# Patient Record
Sex: Female | Born: 1950 | Race: Black or African American | Hispanic: No | Smoking: Former smoker
Health system: Southern US, Community
[De-identification: ages and names within clinical notes are randomized; demographics above are authoritative.]

## PROBLEM LIST (undated history)

## (undated) DIAGNOSIS — I10 Essential (primary) hypertension: Secondary | ICD-10-CM

## (undated) DIAGNOSIS — F329 Major depressive disorder, single episode, unspecified: Secondary | ICD-10-CM

## (undated) DIAGNOSIS — Z9889 Other specified postprocedural states: Secondary | ICD-10-CM

## (undated) DIAGNOSIS — E1169 Type 2 diabetes mellitus with other specified complication: Secondary | ICD-10-CM

## (undated) DIAGNOSIS — D649 Anemia, unspecified: Secondary | ICD-10-CM

## (undated) DIAGNOSIS — R112 Nausea with vomiting, unspecified: Secondary | ICD-10-CM

## (undated) DIAGNOSIS — F419 Anxiety disorder, unspecified: Secondary | ICD-10-CM

## (undated) DIAGNOSIS — N189 Chronic kidney disease, unspecified: Secondary | ICD-10-CM

## (undated) DIAGNOSIS — E119 Type 2 diabetes mellitus without complications: Secondary | ICD-10-CM

## (undated) DIAGNOSIS — E7800 Pure hypercholesterolemia, unspecified: Secondary | ICD-10-CM

## (undated) DIAGNOSIS — M1990 Unspecified osteoarthritis, unspecified site: Secondary | ICD-10-CM

## (undated) DIAGNOSIS — K219 Gastro-esophageal reflux disease without esophagitis: Secondary | ICD-10-CM

## (undated) DIAGNOSIS — G4730 Sleep apnea, unspecified: Secondary | ICD-10-CM

## (undated) DIAGNOSIS — F32A Depression, unspecified: Secondary | ICD-10-CM

## (undated) HISTORY — PX: ABDOMINAL SURGERY: SHX537

## (undated) HISTORY — PX: COLONOSCOPY: SHX174

## (undated) HISTORY — PX: UPPER GASTROINTESTINAL ENDOSCOPY: SHX188

---
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
## 1980-11-18 HISTORY — PX: ABDOMINAL HYSTERECTOMY: SHX81

## 1981-11-18 HISTORY — PX: BREAST SURGERY: SHX581

## 1984-11-18 HISTORY — PX: RESECTION AXILLARY TUMOR: SUR1236

## 1999-03-13 ENCOUNTER — Encounter: Admission: RE | Admit: 1999-03-13 | Discharge: 1999-03-13 | Payer: Self-pay | Admitting: Hematology and Oncology

## 1999-03-21 ENCOUNTER — Ambulatory Visit (HOSPITAL_COMMUNITY): Admission: RE | Admit: 1999-03-21 | Discharge: 1999-03-21 | Payer: Self-pay | Admitting: *Deleted

## 1999-03-26 ENCOUNTER — Ambulatory Visit (HOSPITAL_COMMUNITY): Admission: RE | Admit: 1999-03-26 | Discharge: 1999-03-26 | Payer: Self-pay | Admitting: *Deleted

## 1999-03-26 ENCOUNTER — Encounter: Payer: Self-pay | Admitting: *Deleted

## 1999-04-10 ENCOUNTER — Ambulatory Visit (HOSPITAL_COMMUNITY): Admission: RE | Admit: 1999-04-10 | Discharge: 1999-04-10 | Payer: Self-pay | Admitting: *Deleted

## 1999-07-10 ENCOUNTER — Encounter: Admission: RE | Admit: 1999-07-10 | Discharge: 1999-07-10 | Payer: Self-pay | Admitting: Internal Medicine

## 1999-08-22 ENCOUNTER — Encounter: Admission: RE | Admit: 1999-08-22 | Discharge: 1999-08-22 | Payer: Self-pay | Admitting: Internal Medicine

## 2000-01-29 ENCOUNTER — Encounter: Admission: RE | Admit: 2000-01-29 | Discharge: 2000-01-29 | Payer: Self-pay | Admitting: Internal Medicine

## 2000-01-29 ENCOUNTER — Encounter: Payer: Self-pay | Admitting: Internal Medicine

## 2000-01-29 ENCOUNTER — Ambulatory Visit (HOSPITAL_COMMUNITY): Admission: RE | Admit: 2000-01-29 | Discharge: 2000-01-29 | Payer: Self-pay | Admitting: Internal Medicine

## 2000-06-18 ENCOUNTER — Encounter: Payer: Self-pay | Admitting: Emergency Medicine

## 2000-06-18 ENCOUNTER — Emergency Department (HOSPITAL_COMMUNITY): Admission: EM | Admit: 2000-06-18 | Discharge: 2000-06-18 | Payer: Self-pay | Admitting: Emergency Medicine

## 2000-12-30 ENCOUNTER — Encounter: Admission: RE | Admit: 2000-12-30 | Discharge: 2000-12-30 | Payer: Self-pay | Admitting: Internal Medicine

## 2001-06-30 ENCOUNTER — Encounter: Admission: RE | Admit: 2001-06-30 | Discharge: 2001-06-30 | Payer: Self-pay | Admitting: Internal Medicine

## 2001-07-28 ENCOUNTER — Encounter: Admission: RE | Admit: 2001-07-28 | Discharge: 2001-07-28 | Payer: Self-pay | Admitting: Internal Medicine

## 2001-08-11 ENCOUNTER — Ambulatory Visit (HOSPITAL_COMMUNITY): Admission: RE | Admit: 2001-08-11 | Discharge: 2001-08-11 | Payer: Self-pay | Admitting: Obstetrics

## 2002-01-19 ENCOUNTER — Encounter: Admission: RE | Admit: 2002-01-19 | Discharge: 2002-01-19 | Payer: Self-pay | Admitting: Internal Medicine

## 2002-04-27 ENCOUNTER — Encounter: Admission: RE | Admit: 2002-04-27 | Discharge: 2002-04-27 | Payer: Self-pay | Admitting: Internal Medicine

## 2003-03-22 ENCOUNTER — Encounter: Admission: RE | Admit: 2003-03-22 | Discharge: 2003-03-22 | Payer: Self-pay | Admitting: Internal Medicine

## 2003-11-29 ENCOUNTER — Encounter: Admission: RE | Admit: 2003-11-29 | Discharge: 2003-11-29 | Payer: Self-pay | Admitting: Internal Medicine

## 2003-12-07 ENCOUNTER — Ambulatory Visit (HOSPITAL_COMMUNITY): Admission: RE | Admit: 2003-12-07 | Discharge: 2003-12-07 | Payer: Self-pay | Admitting: Internal Medicine

## 2003-12-08 ENCOUNTER — Encounter: Admission: RE | Admit: 2003-12-08 | Discharge: 2003-12-08 | Payer: Self-pay | Admitting: Internal Medicine

## 2004-06-05 ENCOUNTER — Encounter: Admission: RE | Admit: 2004-06-05 | Discharge: 2004-06-05 | Payer: Self-pay | Admitting: Internal Medicine

## 2004-12-07 ENCOUNTER — Ambulatory Visit (HOSPITAL_COMMUNITY): Admission: RE | Admit: 2004-12-07 | Discharge: 2004-12-07 | Payer: Self-pay | Admitting: Internal Medicine

## 2005-04-02 ENCOUNTER — Ambulatory Visit: Payer: Self-pay | Admitting: Internal Medicine

## 2005-04-03 ENCOUNTER — Ambulatory Visit (HOSPITAL_COMMUNITY): Admission: RE | Admit: 2005-04-03 | Discharge: 2005-04-03 | Payer: Self-pay | Admitting: Internal Medicine

## 2005-11-26 ENCOUNTER — Ambulatory Visit: Payer: Self-pay | Admitting: Internal Medicine

## 2005-12-09 ENCOUNTER — Ambulatory Visit (HOSPITAL_COMMUNITY): Admission: RE | Admit: 2005-12-09 | Discharge: 2005-12-09 | Payer: Self-pay | Admitting: Internal Medicine

## 2005-12-17 ENCOUNTER — Ambulatory Visit: Payer: Self-pay | Admitting: Internal Medicine

## 2006-04-23 ENCOUNTER — Ambulatory Visit: Payer: Self-pay | Admitting: Family Medicine

## 2006-04-28 ENCOUNTER — Ambulatory Visit (HOSPITAL_COMMUNITY): Admission: RE | Admit: 2006-04-28 | Discharge: 2006-04-28 | Payer: Self-pay | Admitting: Family Medicine

## 2006-05-26 ENCOUNTER — Ambulatory Visit: Payer: Self-pay | Admitting: Family Medicine

## 2006-06-09 ENCOUNTER — Ambulatory Visit: Payer: Self-pay | Admitting: Family Medicine

## 2006-06-10 ENCOUNTER — Ambulatory Visit (HOSPITAL_COMMUNITY): Admission: RE | Admit: 2006-06-10 | Discharge: 2006-06-10 | Payer: Self-pay | Admitting: Family Medicine

## 2006-06-12 ENCOUNTER — Ambulatory Visit: Payer: Self-pay | Admitting: Gastroenterology

## 2006-06-16 ENCOUNTER — Encounter (HOSPITAL_COMMUNITY): Admission: RE | Admit: 2006-06-16 | Discharge: 2006-07-16 | Payer: Self-pay | Admitting: Family Medicine

## 2006-06-23 ENCOUNTER — Ambulatory Visit (HOSPITAL_COMMUNITY): Admission: RE | Admit: 2006-06-23 | Discharge: 2006-06-23 | Payer: Self-pay | Admitting: Gastroenterology

## 2006-06-23 ENCOUNTER — Ambulatory Visit: Payer: Self-pay | Admitting: Gastroenterology

## 2006-10-13 ENCOUNTER — Ambulatory Visit: Payer: Self-pay | Admitting: Family Medicine

## 2006-11-21 DIAGNOSIS — K219 Gastro-esophageal reflux disease without esophagitis: Secondary | ICD-10-CM | POA: Insufficient documentation

## 2006-11-21 DIAGNOSIS — I10 Essential (primary) hypertension: Secondary | ICD-10-CM | POA: Insufficient documentation

## 2006-11-21 DIAGNOSIS — J309 Allergic rhinitis, unspecified: Secondary | ICD-10-CM | POA: Insufficient documentation

## 2006-11-21 DIAGNOSIS — F418 Other specified anxiety disorders: Secondary | ICD-10-CM

## 2006-12-11 ENCOUNTER — Ambulatory Visit (HOSPITAL_COMMUNITY): Admission: RE | Admit: 2006-12-11 | Discharge: 2006-12-11 | Payer: Self-pay | Admitting: Family Medicine

## 2006-12-31 ENCOUNTER — Encounter: Payer: Self-pay | Admitting: Family Medicine

## 2006-12-31 LAB — CONVERTED CEMR LAB
Bilirubin Urine: NEGATIVE
Hemoglobin, Urine: NEGATIVE
Ketones, ur: NEGATIVE mg/dL
Nitrite: NEGATIVE
Specific Gravity, Urine: 1.027 (ref 1.005–1.03)
Urobilinogen, UA: 0.2 (ref 0.0–1.0)

## 2007-01-05 ENCOUNTER — Encounter: Payer: Self-pay | Admitting: Family Medicine

## 2007-01-05 LAB — CONVERTED CEMR LAB
BUN: 13 mg/dL (ref 6–23)
Potassium: 4.4 meq/L (ref 3.5–5.3)
VLDL: 28 mg/dL (ref 0–40)

## 2007-01-09 ENCOUNTER — Ambulatory Visit: Payer: Self-pay | Admitting: Family Medicine

## 2007-01-12 ENCOUNTER — Ambulatory Visit (HOSPITAL_COMMUNITY): Admission: RE | Admit: 2007-01-12 | Discharge: 2007-01-12 | Payer: Self-pay | Admitting: Family Medicine

## 2007-01-13 ENCOUNTER — Ambulatory Visit (HOSPITAL_COMMUNITY): Admission: RE | Admit: 2007-01-13 | Discharge: 2007-01-13 | Payer: Self-pay | Admitting: Family Medicine

## 2007-04-09 ENCOUNTER — Other Ambulatory Visit: Admission: RE | Admit: 2007-04-09 | Discharge: 2007-04-09 | Payer: Self-pay | Admitting: Family Medicine

## 2007-04-09 ENCOUNTER — Encounter: Payer: Self-pay | Admitting: Family Medicine

## 2007-04-09 ENCOUNTER — Ambulatory Visit: Payer: Self-pay | Admitting: Family Medicine

## 2007-04-09 ENCOUNTER — Encounter (INDEPENDENT_AMBULATORY_CARE_PROVIDER_SITE_OTHER): Payer: Self-pay | Admitting: *Deleted

## 2007-04-20 ENCOUNTER — Ambulatory Visit (HOSPITAL_COMMUNITY): Admission: RE | Admit: 2007-04-20 | Discharge: 2007-04-20 | Payer: Self-pay | Admitting: Family Medicine

## 2007-05-12 ENCOUNTER — Encounter: Payer: Self-pay | Admitting: Family Medicine

## 2007-05-12 LAB — CONVERTED CEMR LAB
AST: 19 units/L (ref 0–37)
Cholesterol: 192 mg/dL (ref 0–200)
Indirect Bilirubin: 0.2 mg/dL (ref 0.0–0.9)
Total Bilirubin: 0.3 mg/dL (ref 0.3–1.2)
Total Protein: 7.8 g/dL (ref 6.0–8.3)
VLDL: 34 mg/dL (ref 0–40)

## 2007-05-21 ENCOUNTER — Ambulatory Visit: Payer: Self-pay | Admitting: Family Medicine

## 2007-09-16 ENCOUNTER — Encounter: Payer: Self-pay | Admitting: Family Medicine

## 2007-09-16 LAB — CONVERTED CEMR LAB
BUN: 21 mg/dL (ref 6–23)
Basophils Relative: 1 % (ref 0–1)
Eosinophils Absolute: 0.2 10*3/uL (ref 0.0–0.7)
Hemoglobin: 12.5 g/dL (ref 12.0–15.0)
LDL Cholesterol: 146 mg/dL — ABNORMAL HIGH (ref 0–99)
Lymphocytes Relative: 47 % — ABNORMAL HIGH (ref 12–46)
Lymphs Abs: 2.5 10*3/uL (ref 0.7–3.3)
MCV: 81.8 fL (ref 78.0–100.0)
Monocytes Relative: 10 % (ref 3–11)
Neutro Abs: 2.1 10*3/uL (ref 1.7–7.7)
Platelets: 207 10*3/uL (ref 150–400)
RDW: 15.9 % — ABNORMAL HIGH (ref 11.5–14.0)
Sodium: 140 meq/L (ref 135–145)
Triglycerides: 114 mg/dL (ref <150)
VLDL: 23 mg/dL (ref 0–40)
WBC: 5.4 10*3/uL (ref 4.0–10.5)

## 2007-09-21 ENCOUNTER — Ambulatory Visit: Payer: Self-pay | Admitting: Family Medicine

## 2007-09-30 ENCOUNTER — Ambulatory Visit: Payer: Self-pay | Admitting: Cardiovascular Disease

## 2007-10-06 ENCOUNTER — Ambulatory Visit (HOSPITAL_COMMUNITY): Admission: RE | Admit: 2007-10-06 | Discharge: 2007-10-06 | Payer: Self-pay | Admitting: Cardiovascular Disease

## 2007-10-06 ENCOUNTER — Ambulatory Visit: Payer: Self-pay | Admitting: Cardiovascular Disease

## 2007-10-19 ENCOUNTER — Ambulatory Visit: Payer: Self-pay | Admitting: Internal Medicine

## 2007-10-19 ENCOUNTER — Encounter (HOSPITAL_COMMUNITY): Admission: RE | Admit: 2007-10-19 | Discharge: 2007-11-18 | Payer: Self-pay | Admitting: Cardiovascular Disease

## 2007-11-19 ENCOUNTER — Encounter: Payer: Self-pay | Admitting: Family Medicine

## 2007-12-14 ENCOUNTER — Ambulatory Visit (HOSPITAL_COMMUNITY): Admission: RE | Admit: 2007-12-14 | Discharge: 2007-12-14 | Payer: Self-pay | Admitting: Family Medicine

## 2008-02-08 ENCOUNTER — Encounter: Payer: Self-pay | Admitting: Family Medicine

## 2008-02-08 LAB — CONVERTED CEMR LAB
AST: 18 units/L (ref 0–37)
Alkaline Phosphatase: 70 units/L (ref 39–117)
Bilirubin, Direct: 0.1 mg/dL (ref 0.0–0.3)
CO2: 26 meq/L (ref 19–32)
Calcium: 9.8 mg/dL (ref 8.4–10.5)
Chloride: 104 meq/L (ref 96–112)
Cholesterol: 228 mg/dL — ABNORMAL HIGH (ref 0–200)
Glucose, Bld: 103 mg/dL — ABNORMAL HIGH (ref 70–99)
HCT: 40.2 % (ref 36.0–46.0)
Hemoglobin: 12.3 g/dL (ref 12.0–15.0)
Indirect Bilirubin: 0.1 mg/dL (ref 0.0–0.9)
LDL Cholesterol: 133 mg/dL — ABNORMAL HIGH (ref 0–99)
MCV: 82.5 fL (ref 78.0–100.0)
Monocytes Relative: 9 % (ref 3–12)
Neutro Abs: 2 10*3/uL (ref 1.7–7.7)
Platelets: 233 10*3/uL (ref 150–400)
RDW: 16.8 % — ABNORMAL HIGH (ref 11.5–15.5)
Sodium: 140 meq/L (ref 135–145)
Triglycerides: 178 mg/dL — ABNORMAL HIGH (ref <150)
VLDL: 36 mg/dL (ref 0–40)

## 2008-02-09 ENCOUNTER — Ambulatory Visit: Payer: Self-pay | Admitting: Family Medicine

## 2008-02-18 ENCOUNTER — Encounter (INDEPENDENT_AMBULATORY_CARE_PROVIDER_SITE_OTHER): Payer: Self-pay | Admitting: *Deleted

## 2008-06-02 ENCOUNTER — Encounter: Payer: Self-pay | Admitting: Family Medicine

## 2008-06-02 LAB — CONVERTED CEMR LAB
ALT: 23 units/L (ref 0–35)
Alkaline Phosphatase: 75 units/L (ref 39–117)
HDL: 52 mg/dL (ref >39)
Triglycerides: 118 mg/dL (ref <150)

## 2008-06-16 ENCOUNTER — Ambulatory Visit: Payer: Self-pay | Admitting: Family Medicine

## 2008-06-16 ENCOUNTER — Encounter: Payer: Self-pay | Admitting: Family Medicine

## 2008-06-17 ENCOUNTER — Ambulatory Visit (HOSPITAL_COMMUNITY): Admission: RE | Admit: 2008-06-17 | Discharge: 2008-06-17 | Payer: Self-pay | Admitting: Family Medicine

## 2008-06-17 DIAGNOSIS — E785 Hyperlipidemia, unspecified: Secondary | ICD-10-CM | POA: Insufficient documentation

## 2008-10-17 ENCOUNTER — Encounter: Payer: Self-pay | Admitting: Family Medicine

## 2008-10-17 LAB — CONVERTED CEMR LAB
AST: 16 units/L (ref 0–37)
Alkaline Phosphatase: 61 units/L (ref 39–117)
Calcium: 9.4 mg/dL (ref 8.4–10.5)
Cholesterol: 160 mg/dL (ref 0–200)
HDL: 49 mg/dL (ref >39)
LDL Cholesterol: 82 mg/dL (ref 0–99)
Potassium: 3.7 meq/L (ref 3.5–5.3)
Total CHOL/HDL Ratio: 3.3
Total Protein: 7.3 g/dL (ref 6.0–8.3)
Triglycerides: 146 mg/dL (ref <150)
VLDL: 29 mg/dL (ref 0–40)

## 2008-10-19 ENCOUNTER — Ambulatory Visit: Payer: Self-pay | Admitting: Family Medicine

## 2009-01-02 ENCOUNTER — Ambulatory Visit (HOSPITAL_COMMUNITY): Admission: RE | Admit: 2009-01-02 | Discharge: 2009-01-02 | Payer: Self-pay | Admitting: Family Medicine

## 2009-02-17 ENCOUNTER — Encounter: Payer: Self-pay | Admitting: Family Medicine

## 2009-02-17 LAB — CONVERTED CEMR LAB
AST: 20 units/L (ref 0–37)
BUN: 19 mg/dL (ref 6–23)
Calcium: 9.2 mg/dL (ref 8.4–10.5)
Cholesterol: 165 mg/dL (ref 0–200)
Creatinine, Ser: 0.75 mg/dL (ref 0.40–1.20)
Potassium: 3.9 meq/L (ref 3.5–5.3)
Sodium: 141 meq/L (ref 135–145)
Total Bilirubin: 0.4 mg/dL (ref 0.3–1.2)
Total Protein: 7.8 g/dL (ref 6.0–8.3)

## 2009-02-22 ENCOUNTER — Ambulatory Visit: Payer: Self-pay | Admitting: Family Medicine

## 2009-02-24 DIAGNOSIS — E669 Obesity, unspecified: Secondary | ICD-10-CM | POA: Insufficient documentation

## 2009-05-17 DIAGNOSIS — R079 Chest pain, unspecified: Secondary | ICD-10-CM

## 2009-05-17 DIAGNOSIS — R0602 Shortness of breath: Secondary | ICD-10-CM | POA: Insufficient documentation

## 2009-05-17 DIAGNOSIS — R002 Palpitations: Secondary | ICD-10-CM | POA: Insufficient documentation

## 2009-08-02 ENCOUNTER — Ambulatory Visit: Payer: Self-pay | Admitting: Family Medicine

## 2009-08-02 LAB — CONVERTED CEMR LAB
ALT: 18 units/L (ref 0–35)
AST: 15 units/L (ref 0–37)
Albumin: 4.5 g/dL (ref 3.5–5.2)
BUN: 17 mg/dL (ref 6–23)
Basophils Relative: 1 % (ref 0–1)
Creatinine, Ser: 0.67 mg/dL (ref 0.40–1.20)
HCT: 38.5 % (ref 36.0–46.0)
HDL: 57 mg/dL (ref >39)
Hemoglobin: 12.1 g/dL (ref 12.0–15.0)
Indirect Bilirubin: 0.1 mg/dL (ref 0.0–0.9)
LDL Cholesterol: 86 mg/dL (ref 0–99)
Lymphs Abs: 2.5 10*3/uL (ref 0.7–4.0)
Monocytes Absolute: 0.6 10*3/uL (ref 0.1–1.0)
Monocytes Relative: 10 % (ref 3–12)
Neutro Abs: 2.3 10*3/uL (ref 1.7–7.7)
Platelets: 230 10*3/uL (ref 150–400)
Total Bilirubin: 0.2 mg/dL — ABNORMAL LOW (ref 0.3–1.2)
Total CHOL/HDL Ratio: 3
Total Protein: 7.6 g/dL (ref 6.0–8.3)
WBC: 5.6 10*3/uL (ref 4.0–10.5)

## 2009-08-03 LAB — CONVERTED CEMR LAB: Hgb A1c MFr Bld: 6.7 % — ABNORMAL HIGH (ref 4.6–6.1)

## 2009-08-13 DIAGNOSIS — E739 Lactose intolerance, unspecified: Secondary | ICD-10-CM

## 2009-08-15 LAB — CONVERTED CEMR LAB: Glucose, 2 hour: 114 mg/dL (ref 70–139)

## 2009-12-28 LAB — CONVERTED CEMR LAB
BUN: 16 mg/dL (ref 6–23)
CO2: 31 meq/L (ref 19–32)
Calcium: 9.9 mg/dL (ref 8.4–10.5)
Chloride: 102 meq/L (ref 96–112)
Glucose, Bld: 86 mg/dL (ref 70–99)
HDL: 58 mg/dL (ref >39)
LDL Cholesterol: 82 mg/dL (ref 0–99)
Potassium: 3.9 meq/L (ref 3.5–5.3)
Total Bilirubin: 0.4 mg/dL (ref 0.3–1.2)
Total CHOL/HDL Ratio: 2.7
Total Protein: 7.4 g/dL (ref 6.0–8.3)
VLDL: 17 mg/dL (ref 0–40)

## 2010-01-01 ENCOUNTER — Ambulatory Visit: Payer: Self-pay | Admitting: Family Medicine

## 2010-01-01 ENCOUNTER — Other Ambulatory Visit: Admission: RE | Admit: 2010-01-01 | Discharge: 2010-01-01 | Payer: Self-pay | Admitting: Family Medicine

## 2010-01-01 LAB — CONVERTED CEMR LAB
Hgb A1c MFr Bld: 6.4 %
Pap Smear: NEGATIVE

## 2010-01-04 ENCOUNTER — Ambulatory Visit (HOSPITAL_COMMUNITY): Admission: RE | Admit: 2010-01-04 | Discharge: 2010-01-04 | Payer: Self-pay | Admitting: Family Medicine

## 2010-04-03 ENCOUNTER — Ambulatory Visit: Payer: Self-pay | Admitting: Family Medicine

## 2010-04-03 DIAGNOSIS — J019 Acute sinusitis, unspecified: Secondary | ICD-10-CM | POA: Insufficient documentation

## 2010-04-03 DIAGNOSIS — J209 Acute bronchitis, unspecified: Secondary | ICD-10-CM | POA: Insufficient documentation

## 2010-06-11 LAB — CONVERTED CEMR LAB
ALT: 21 units/L (ref 0–35)
Albumin: 4.3 g/dL (ref 3.5–5.2)
BUN: 18 mg/dL (ref 6–23)
Chloride: 104 meq/L (ref 96–112)
Cholesterol: 163 mg/dL (ref 0–200)
HDL: 53 mg/dL (ref >39)
Indirect Bilirubin: 0.1 mg/dL (ref 0.0–0.9)
LDL Cholesterol: 87 mg/dL (ref 0–99)
Potassium: 4.1 meq/L (ref 3.5–5.3)
Sodium: 144 meq/L (ref 135–145)
Total Protein: 6.9 g/dL (ref 6.0–8.3)
Triglycerides: 116 mg/dL (ref <150)
VLDL: 23 mg/dL (ref 0–40)

## 2010-06-12 ENCOUNTER — Ambulatory Visit: Payer: Self-pay | Admitting: Family Medicine

## 2010-06-12 DIAGNOSIS — R7301 Impaired fasting glucose: Secondary | ICD-10-CM | POA: Insufficient documentation

## 2010-06-22 ENCOUNTER — Telehealth: Payer: Self-pay | Admitting: Family Medicine

## 2010-11-21 LAB — CONVERTED CEMR LAB: Hgb A1c MFr Bld: 6.4 % — ABNORMAL HIGH (ref <5.7)

## 2010-11-28 ENCOUNTER — Ambulatory Visit
Admission: RE | Admit: 2010-11-28 | Discharge: 2010-11-28 | Payer: Self-pay | Source: Home / Self Care | Attending: Family Medicine | Admitting: Family Medicine

## 2010-11-28 DIAGNOSIS — E1169 Type 2 diabetes mellitus with other specified complication: Secondary | ICD-10-CM

## 2010-11-28 DIAGNOSIS — E669 Obesity, unspecified: Secondary | ICD-10-CM

## 2010-11-28 HISTORY — DX: Type 2 diabetes mellitus with other specified complication: E66.9

## 2010-11-28 HISTORY — DX: Type 2 diabetes mellitus with other specified complication: E11.69

## 2010-11-29 ENCOUNTER — Encounter: Payer: Self-pay | Admitting: Family Medicine

## 2010-12-07 ENCOUNTER — Other Ambulatory Visit: Payer: Self-pay | Admitting: Family Medicine

## 2010-12-07 DIAGNOSIS — Z1239 Encounter for other screening for malignant neoplasm of breast: Secondary | ICD-10-CM

## 2010-12-09 ENCOUNTER — Encounter: Payer: Self-pay | Admitting: Family Medicine

## 2010-12-13 ENCOUNTER — Telehealth: Payer: Self-pay | Admitting: Family Medicine

## 2010-12-18 NOTE — Letter (Signed)
Summary: consults  consults   Imported By: Curtis Sites 04/25/2010 12:52:00  _____________________________________________________________________  External Attachment:    Type:   Image     Comment:   External Document

## 2010-12-18 NOTE — Letter (Signed)
Summary: lab  lab   Imported By: Curtis Sites 04/25/2010 12:52:54  _____________________________________________________________________  External Attachment:    Type:   Image     Comment:   External Document

## 2010-12-18 NOTE — Letter (Signed)
Summary: history and physical  history and physical   Imported By: Curtis Sites 04/25/2010 12:52:39  _____________________________________________________________________  External Attachment:    Type:   Image     Comment:   External Document

## 2010-12-18 NOTE — Letter (Signed)
Summary: xray  xray   Imported By: Curtis Sites 04/25/2010 12:54:02  _____________________________________________________________________  External Attachment:    Type:   Image     Comment:   External Document

## 2010-12-18 NOTE — Letter (Signed)
Summary: misc  misc   Imported By: Curtis Sites 04/25/2010 12:53:11  _____________________________________________________________________  External Attachment:    Type:   Image     Comment:   External Document

## 2010-12-18 NOTE — Letter (Signed)
Summary: demographic  demographic   Imported By: Curtis Sites 04/25/2010 12:52:16  _____________________________________________________________________  External Attachment:    Type:   Image     Comment:   External Document

## 2010-12-18 NOTE — Assessment & Plan Note (Signed)
Summary: office visit   Vital Signs:  Patient profile:   60 year old female Menstrual status:  hysterectomy Height:      66 inches Weight:      215 pounds BMI:     34.83 O2 Sat:      97 % Pulse rate:   91 / minute Pulse rhythm:   regular Resp:     16 per minute BP sitting:   120 / 78  (left arm) Cuff size:   large  Vitals Entered By: Everitt Amber LPN (June 12, 2010 1:53 PM)  Nutrition Counseling: Patient's BMI is greater than 25 and therefore counseled on weight management options. CC: Follow up chronic problems   CC:  Follow up chronic problems.  History of Present Illness: Reports  that she has been  doing well. Denies recent fever or chills. Denies sinus pressure, nasal congestion , ear pain or sore throat. Denies chest congestion, or cough productive of sputum. Denies chest pain, palpitations, PND, orthopnea or leg swelling. Denies abdominal pain, nausea, vomitting, diarrhea or constipation. Denies change in bowel movements or bloody stool. Denies dysuria , frequency, incontinence or hesitancy. Denies  joint pain, swelling, or reduced mobility. Denies headaches, vertigo, seizures. Denies depression, anxiety or insomnia. Denies  rash, lesions, or itch.     Current Medications (verified): 1)  Prozac 20 Mg Caps (Fluoxetine Hcl) .... Take 1 Tablet By Mouth Once A Day 2)  Cardizem Cd 180 Mg Cp24 (Diltiazem Hcl Coated Beads) .... Take 1 Tablet By Mouth Once A Day 3)  Prevacid 30 Mg  Cpdr (Lansoprazole) .... One Cap By Mouth Once Daily 4)  Excedrin Back & Body 250-250 Mg  Tabs (Acetaminophen-Aspirin Buffered) .... Two Tabs By Mouth Once Daily 5)  Hydrochlorothiazide 25 Mg Tabs (Hydrochlorothiazide) .... Take 1 Tablet By Mouth Every Morning 6)  Oscal 500/200 D-3 500-200 Mg-Unit  Tabs (Calcium-Vitamin D) .... One Tab By Mouth Three Times A Day 7)  Certa-Vite-Lutein   Tabs (Multiple Vitamins-Minerals) .... One Tab By Mouth Once Daily 8)  Lovastatin 40 Mg  Tabs (Lovastatin)  .... One Tab By Mouth At Bedtime 9)  Ibuprofen 800 Mg  Tabs (Ibuprofen) .... One Tab By Mouth Two Times A Day As Needed 10)  Benazepril Hcl 40 Mg Tabs (Benazepril Hcl) .... Take 1 Tablet By Mouth Once A Day  Allergies (verified): No Known Drug Allergies  Review of Systems      See HPI General:  Complains of fatigue, sleep disorder, and sweats. Eyes:  Denies blurring, discharge, eye pain, and red eye. MS:  Complains of low back pain and mid back pain. Psych:  Complains of anxiety, depression, and irritability; denies mental problems, suicidal thoughts/plans, and thoughts of violence; reports perimenstrual mood instability, not affecting her job, but "hateful" to her spouse who she states understands, no physical or mental abuse involved.. Endo:  Denies cold intolerance, excessive thirst, excessive urination, heat intolerance, and polyuria. Heme:  Denies abnormal bruising and bleeding. Allergy:  Denies hives or rash and itching eyes.  Physical Exam  General:  Well-developed,well-nourished,in no acute distress; alert,appropriate and cooperative throughout examination HEENT: No facial asymmetry,  EOMI, No sinus tenderness, TM's Clear, oropharynx  pink and moist.   Chest: Clear to auscultation bilaterally.  CVS: S1, S2, No murmurs, No S3.   Abd: Soft, Nontender.  MS: Adequate ROM spine, hips, shoulders and knees.  Ext: No edema.   CNS: CN 2-12 intact, power tone and sensation normal throughout.   Skin: Intact, no visible  lesions or rashes.  Psych: Good eye contact, normal affect.  Memory intact, not anxious or depressed appearing.    Impression & Recommendations:  Problem # 1:  IMPAIRED FASTING GLUCOSE (ICD-790.21) Assessment Comment Only  Her updated medication list for this problem includes:    Metformin Hcl 500 Mg Tabs (Metformin hcl) .Marland Kitchen... Take 1 tablet by mouth two times a day, rept HBA1c ovwr 6.5  Orders: T- Hemoglobin A1C (16109-60454) T- Hemoglobin A1C  (09811-91478)  Problem # 2:  HYPERLIPIDEMIA (ICD-272.4) Assessment: Unchanged  Her updated medication list for this problem includes:    Lovastatin 40 Mg Tabs (Lovastatin) ..... One tab by mouth at bedtime  Labs Reviewed: SGOT: 16 (06/11/2010)   SGPT: 21 (06/11/2010)   HDL:53 (06/11/2010), 58 (12/28/2009)  LDL:87 (06/11/2010), 82 (12/28/2009)  Chol:163 (06/11/2010), 157 (12/28/2009)  Trig:116 (06/11/2010), 84 (12/28/2009)  Problem # 3:  HYPERTENSION (ICD-401.9) Assessment: Unchanged  Her updated medication list for this problem includes:    Cardizem Cd 180 Mg Cp24 (Diltiazem hcl coated beads) .Marland Kitchen... Take 1 tablet by mouth once a day    Hydrochlorothiazide 25 Mg Tabs (Hydrochlorothiazide) .Marland Kitchen... Take 1 tablet by mouth every morning    Benazepril Hcl 40 Mg Tabs (Benazepril hcl) .Marland Kitchen... Take 1 tablet by mouth once a day  BP today: 120/78 Prior BP: 114/78 (04/03/2010)  Labs Reviewed: K+: 4.1 (06/11/2010) Creat: : 0.66 (06/11/2010)   Chol: 163 (06/11/2010)   HDL: 53 (06/11/2010)   LDL: 87 (06/11/2010)   TG: 116 (06/11/2010)  Problem # 4:  OBESITY, UNSPECIFIED (ICD-278.00) Assessment: Unchanged  Ht: 66 (06/12/2010)   Wt: 215 (06/12/2010)   BMI: 34.83 (06/12/2010)  Complete Medication List: 1)  Prozac 20 Mg Caps (Fluoxetine hcl) .... Take 1 tablet by mouth once a day 2)  Cardizem Cd 180 Mg Cp24 (Diltiazem hcl coated beads) .... Take 1 tablet by mouth once a day 3)  Prevacid 30 Mg Cpdr (Lansoprazole) .... One cap by mouth once daily 4)  Excedrin Back & Body 250-250 Mg Tabs (Acetaminophen-aspirin buffered) .... Two tabs by mouth once daily 5)  Hydrochlorothiazide 25 Mg Tabs (Hydrochlorothiazide) .... Take 1 tablet by mouth every morning 6)  Oscal 500/200 D-3 500-200 Mg-unit Tabs (Calcium-vitamin d) .... One tab by mouth three times a day 7)  Certa-vite-lutein Tabs (Multiple vitamins-minerals) .... One tab by mouth once daily 8)  Lovastatin 40 Mg Tabs (Lovastatin) .... One tab by mouth at  bedtime 9)  Ibuprofen 800 Mg Tabs (Ibuprofen) .... One tab by mouth two times a day as needed 10)  Benazepril Hcl 40 Mg Tabs (Benazepril hcl) .... Take 1 tablet by mouth once a day 11)  Metformin Hcl 500 Mg Tabs (Metformin hcl) .... Take 1 tablet by mouth two times a day  Patient Instructions: 1)  Please schedule a follow-up appointment in early January. 2)  It is important that you exercise regularly at least 30 minutes 5 times a week. If you develop chest pain, have severe difficulty breathing, or feel very tired , stop exercising immediately and seek medical attention. 3)  You need to lose weight. Consider a lower calorie diet and regular exercise.  4)  HbgA1C prior to visit, ICD-9:  today , no extra charge for lab draw..your cholesterol and blood pressure a re great, no med changes at this time 5)  rept hBA1c in  January,  Prescriptions: METFORMIN HCL 500 MG TABS (METFORMIN HCL) Take 1 tablet by mouth two times a day  #60 x 3   Entered and  Authorized by:   Syliva Overman MD   Signed by:   Syliva Overman MD on 06/13/2010   Method used:   Electronically to        [facility] [phone]* (retail)       [address]       [place], [place]  [address]       Ph: [phone] or [phone]       Fax: [phone]   RxID:   [id number]

## 2010-12-18 NOTE — Assessment & Plan Note (Signed)
Summary: COLD   Vital Signs:  Patient profile:   60 year old female Menstrual status:  hysterectomy Height:      66 inches Weight:      211 pounds BMI:     34.18 O2 Sat:      95 % Pulse rate:   78 / minute Pulse rhythm:   regular Resp:     16 per minute BP sitting:   114 / 78  (left arm) Cuff size:   large  Vitals Entered By: Everitt Amber LPN (Apr 03, 2010 2:49 PM)  Nutrition Counseling: Patient's BMI is greater than 25 and therefore counseled on weight management options. CC: runny nose, sneezing, congestion in chest. Coughing up greenish colored phlegm that went to clear.    CC:  runny nose, sneezing, and congestion in chest. Coughing up greenish colored phlegm that went to clear. Marland Kitchen  History of Present Illness: 2 weeek h/o head and chest congestion, intermittent chills , yellow sputum and yellow nasal drainage. She had been doing well prior to this with no new concerns. She still chews tobacco. Shedenies any recent flare of back pain. Her depression is controlled on meds. She has not commited to regular exercise and has gained weigth. Her diet is unchanged and uncontrolled.  Allergies: No Known Drug Allergies  Review of Systems      See HPI General:  Complains of chills, fatigue, and fever. Eyes:  Denies blurring and discharge. CV:  Denies chest pain or discomfort, difficulty breathing while lying down, fainting, palpitations, and swelling of feet. GU:  Denies dysuria and urinary frequency. MS:  Denies joint pain and stiffness. Derm:  Denies itching and rash. Neuro:  Denies headaches, poor balance, seizures, sensation of room spinning, and tingling. Psych:  Complains of anxiety and depression; denies mental problems, panic attacks, and suicidal thoughts/plans; controlled on meds. Endo:  Denies excessive hunger and excessive thirst. Heme:  Denies abnormal bruising and enlarge lymph nodes. Allergy:  Complains of seasonal allergies; denies hives or rash and itching  eyes.  Physical Exam  General:  Well-developed,well-nourished,in no acute distress; alert,appropriate and cooperative throughout examination HEENT: No facial asymmetry,  EOMI,frontal and maxillary sinus tenderness, TM's Clear, oropharynx  pink and moist.   Chest: decreased air entry bilaterally with crackles  CVS: S1, S2, No murmurs, No S3.   Abd: Soft, Nontender.  ZO:[id number]  ROM spine,adequate in  hips, shoulders and knees.  Ext: No edema.   CNS: CN 2-12 intact, power tone and sensation normal throughout.   Skin: Intact, no visible lesions or rashes.  Psych: Good eye contact, normal affect.  Memory intact, not anxious or depressed appearing.    Impression & Recommendations:  Problem # 1:  ACUTE BRONCHITIS (ICD-466.0) Assessment Comment Only  Her updated medication list for this problem includes:    Penicillin V Potassium 500 Mg Tabs (Penicillin v potassium) .Marland Kitchen... Take 1 tablet by mouth three times a day  Orders: Albuterol Sulfate Sol 1mg  unit dose (J8119) Atrovent 1mg  (Neb) (J4782) Nebulizer Tx (95621) Depo- Medrol 80mg  (J1040) Rocephin  250mg  (H0865) Admin of Therapeutic Inj  intramuscular or subcutaneous (78469)  Problem # 2:  ACUTE SINUSITIS, UNSPECIFIED (ICD-461.9) Assessment: Comment Only  Her updated medication list for this problem includes:    Penicillin V Potassium 500 Mg Tabs (Penicillin v potassium) .Marland Kitchen... Take 1 tablet by mouth three times a day  Problem # 3:  HYPERLIPIDEMIA (ICD-272.4) Assessment: Unchanged  Her updated medication list for this problem includes:    Lovastatin  40 Mg Tabs (Lovastatin) ..... One tab by mouth at bedtime  Labs Reviewed: SGOT: 18 (12/28/2009)   SGPT: 24 (12/28/2009)   HDL:58 (12/28/2009), 57 (08/01/2009)  LDL:82 (12/28/2009), 86 (08/01/2009)  Chol:157 (12/28/2009), 170 (08/01/2009)  Trig:84 (12/28/2009), 135 (08/01/2009)  Problem # 4:  HYPERTENSION (ICD-401.9) Assessment: Improved  Her updated medication list for this  problem includes:    Cardizem Cd 180 Mg Cp24 (Diltiazem hcl coated beads) .Marland Kitchen... Take 1 tablet by mouth once a day    Hydrochlorothiazide 25 Mg Tabs (Hydrochlorothiazide) .Marland Kitchen... Take 1 tablet by mouth every morning    Benazepril Hcl 40 Mg Tabs (Benazepril hcl) .Marland Kitchen... Take 1 tablet by mouth once a day  BP today: 114/78 Prior BP: 140/90 (01/01/2010)  Labs Reviewed: K+: 3.9 (12/28/2009) Creat: : 0.69 (12/28/2009)   Chol: 157 (12/28/2009)   HDL: 58 (12/28/2009)   LDL: 82 (12/28/2009)   TG: 84 (12/28/2009)  Complete Medication List: 1)  Prozac 20 Mg Caps (Fluoxetine hcl) .... Take 1 tablet by mouth once a day 2)  Cardizem Cd 180 Mg Cp24 (Diltiazem hcl coated beads) .... Take 1 tablet by mouth once a day 3)  Prevacid 30 Mg Cpdr (Lansoprazole) .... One cap by mouth once daily 4)  Excedrin Back & Body 250-250 Mg Tabs (Acetaminophen-aspirin buffered) .... Two tabs by mouth once daily 5)  Hydrochlorothiazide 25 Mg Tabs (Hydrochlorothiazide) .... Take 1 tablet by mouth every morning 6)  Oscal 500/200 D-3 500-200 Mg-unit Tabs (Calcium-vitamin d) .... One tab by mouth three times a day 7)  Certa-vite-lutein Tabs (Multiple vitamins-minerals) .... One tab by mouth once daily 8)  Lovastatin 40 Mg Tabs (Lovastatin) .... One tab by mouth at bedtime 9)  Ibuprofen 800 Mg Tabs (Ibuprofen) .... One tab by mouth two times a day as needed 10)  Benazepril Hcl 40 Mg Tabs (Benazepril hcl) .... Take 1 tablet by mouth once a day 11)  Penicillin V Potassium 500 Mg Tabs (Penicillin v potassium) .... Take 1 tablet by mouth three times a day  Patient Instructions: 1)  F/U as before. 2)  You are being treated for acute sinusitis and bronchitis, yoou will get an injection in the officwe and meds are also sent in.You will also have  breathing treatment 3)  Pls drink alot of water and get rest. Prescriptions: PENICILLIN V POTASSIUM 500 MG TABS (PENICILLIN V POTASSIUM) Take 1 tablet by mouth three times a day  #42 x 0    Entered and Authorized by:   Syliva Overman MD   Signed by:   Syliva Overman MD on 04/03/2010   Method used:   Electronically to        [facility] [phone]* (retail)       [address]       [place], [place]  [address]       Ph: [phone] or [phone]       Fax: [phone]   RxID:   [id number]    Medication Administration  Injection # 1:    Medication: Depo- Medrol 80mg     Diagnosis: ACUTE BRONCHITIS (ICD-466.0)    Route: IM    Site: RUOQ gluteus    Exp Date: 11/2010    Lot #: [id number]    Mfr: Pharmacia    Comments: 80mg  given     Patient tolerated injection without complications    Given by: Everitt Amber LPN (Apr 03, 2010 4:52 PM)  Injection # 2:    Medication:  Rocephin  250mg     Diagnosis: ACUTE BRONCHITIS (ICD-466.0)    Route: IM    Site: LUOQ gluteus    Exp Date: 07/2012    Lot #: [id number]    Mfr: novaplus    Comments: 500mg  given     Patient tolerated injection without complications    Given by: Everitt Amber LPN (Apr 03, 2010 4:52 PM)  Medication # 1:    Medication: Albuterol Sulfate Sol 1mg  unit dose    Diagnosis: ACUTE BRONCHITIS (ICD-466.0)    Dose: 2.5/41ml    Route: inhaled    Exp Date: 10/2010    Lot #: [id number]    Mfr: nephron    Patient tolerated medication without complications    Given by: Everitt Amber LPN (Apr 03, 2010 4:50 PM)  Medication # 2:    Medication: Atrovent 1mg  (Neb)    Diagnosis: ACUTE BRONCHITIS (ICD-466.0)    Dose: 0.5/2.17ml    Route: inhaled    Exp Date: 10/2010    Lot #: [id number]    Mfr: nephron    Patient tolerated medication without complications    Given by: Everitt Amber LPN (Apr 03, 2010 4:51 PM)  Orders Added: 1)  Est. Patient Level IV [27253] 2)  Albuterol Sulfate Sol 1mg  unit dose [J7613] 3)  Atrovent 1mg  (Neb) [G6440] 4)  Nebulizer Tx [34742] 5)  Depo- Medrol 80mg  [J1040] 6)  Rocephin  250mg  [J0696] 7)  Admin of Therapeutic Inj  intramuscular or subcutaneous [59563]

## 2010-12-18 NOTE — Letter (Signed)
Summary: progress notes  progress notes   Imported By: Curtis Sites 04/25/2010 12:53:47  _____________________________________________________________________  External Attachment:    Type:   Image     Comment:   External Document

## 2010-12-18 NOTE — Progress Notes (Signed)
Summary: please advise  Phone Note Call from Patient   Summary of Call: patient stopped by and states she can't take the metformin that you gave her, she said they make her sick, you can [facility] at [facility] if before 2:30.  she states she isn't taken the medication at all. Initial call taken by: Curtis Sites,  June 22, 2010 9:30 AM  Follow-up for Phone Call        noted STOP METFORMIN, START JANUVIA 100MG  ONE DAILY, PT TO COLLECT SCRIPT AND COUPON TODAY, SHE WILL CALL AROUND 2:30 Follow-up by: Syliva Overman MD,  June 22, 2010 11:52 AM    New/Updated Medications: JANUVIA 100 MG TABS (SITAGLIPTIN PHOSPHATE) Take 1 tablet by mouth once a day Prescriptions: JANUVIA 100 MG TABS (SITAGLIPTIN PHOSPHATE) Take 1 tablet by mouth once a day  #30 x 3   Entered and Authorized by:   Syliva Overman MD   Signed by:   Syliva Overman MD on 06/22/2010   Method used:   Print then Give to Patient   RxID:   [id number]   Appended Document: please advise I have rx and coupons ready for patient to pickup

## 2010-12-18 NOTE — Letter (Signed)
Summary: phone  phone   Imported By: Curtis Sites 04/25/2010 12:53:26  _____________________________________________________________________  External Attachment:    Type:   Image     Comment:   External Document

## 2010-12-18 NOTE — Assessment & Plan Note (Signed)
Summary: PHY   Vital Signs:  Patient profile:   60 year old female Menstrual status:  hysterectomy Height:      66 inches Weight:      207 pounds BMI:     33.53 O2 Sat:      93 % Pulse rate:   74 / minute Pulse rhythm:   regular Resp:     16 per minute BP sitting:   140 / 90  (right arm) Cuff size:   large  Vitals Entered By: Everitt Amber (January 01, 2010 2:04 PM)  Nutrition Counseling: Patient's BMI is greater than 25 and therefore counseled on weight management options. CC: CPE Is Patient Diabetic? No  Vision Screening:Left eye with correction: 20 / 20 Right eye with correction: 20 / 20 Both eyes with correction: 20 / 20  Color vision testing: normal      Vision Entered By: Everitt Amber (January 01, 2010 2:05 PM)   CC:  CPE.  History of Present Illness: Reports  that she has been doing well. Denies recent fever or chills. Denies sinus pressure, nasal congestion , ear pain or sore throat. Denies chest congestion, or cough productive of sputum. Denies chest pain, palpitations, PND, orthopnea or leg swelling. Denies abdominal pain, nausea, vomitting, diarrhea or constipation. Denies change in bowel movements or bloody stool. Denies dysuria , frequency, incontinence or hesitancy. Denies  joint pain, swelling, or reduced mobility. Denies headaches, vertigo, seizures. Denies depression, anxiety or insomnia. Denies  rash, lesions, or itch. the pt has changed her diet and lifestyle nad has been succesful in weight loss and improved blood sugars     Allergies: No Known Drug Allergies  Review of Systems      See HPI General:  Complains of fatigue; takes aspirin with caffeine daily for energy. Eyes:  Denies blurring and discharge. Endo:  Denies cold intolerance, excessive hunger, excessive thirst, excessive urination, heat intolerance, polyuria, and weight change. Heme:  Denies abnormal bruising and bleeding. Allergy:  Denies hives or rash, itching eyes, and  sneezing.  Physical Exam  General:  Well-developed,well-nourished,in no acute distress; alert,appropriate and cooperative throughout examination Head:  Normocephalic and atraumatic without obvious abnormalities. No apparent alopecia or balding. Eyes:  No corneal or conjunctival inflammation noted. EOMI. Perrla. Funduscopic exam benign, without hemorrhages, exudates or papilledema. Vision grossly normal. Ears:  External ear exam shows no significant lesions or deformities.  Otoscopic examination reveals clear canals, tympanic membranes are intact bilaterally without bulging, retraction, inflammation or discharge. Hearing is grossly normal bilaterally. Nose:  External nasal examination shows no deformity or inflammation. Nasal mucosa are pink and moist without lesions or exudates. Mouth:  Oral mucosa and oropharynx without lesions or exudates.  dentures Neck:  No deformities, masses, or tenderness noted. Chest Wall:  No deformities, masses, or tenderness noted. Breasts:  No mass, nodules, thickening, tenderness, bulging, retraction, inflamation, nipple discharge or skin changes noted.   Lungs:  Normal respiratory effort, chest expands symmetrically. Lungs are clear to auscultation, no crackles or wheezes. Heart:  Normal rate and regular rhythm. S1 and S2 normal without gallop, murmur, click, rub or other extra sounds. Abdomen:  Bowel sounds positive,abdomen soft and non-tender without masses, organomegaly or hernias noted. Rectal:  No external abnormalities noted. Normal sphincter tone. No rectal masses or tenderness.Guaic negative stool Genitalia:  normal introitus, no external lesions, no vaginal discharge, and mucosa pink and moist.  Uterus absent, no adnexal masses Msk:  No deformity or scoliosis noted of thoracic or lumbar spine.  Pulses:  R and L carotid,radial,femoral,dorsalis pedis and posterior tibial pulses are full and equal bilaterally Extremities:  No clubbing, cyanosis, edema, or  deformity noted with normal full range of motion of all joints.   Neurologic:  No cranial nerve deficits noted. Station and gait are normal. Plantar reflexes are down-going bilaterally. DTRs are symmetrical throughout. Sensory, motor and coordinative functions appear intact. Skin:  Intact without suspicious lesions or rashes Cervical Nodes:  No lymphadenopathy noted Axillary Nodes:  No palpable lymphadenopathy Inguinal Nodes:  No significant adenopathy Psych:  Cognition and judgment appear intact. Alert and cooperative with normal attention span and concentration. No apparent delusions, illusions, hallucinations   Impression & Recommendations:  Problem # 1:  IMPAIRED GLUCOSE TOLERANCE (ICD-271.3) Assessment Improved  Orders: Hemoglobin A1C (83036)6.4, was 6.7  Problem # 2:  DEPRESSION (ICD-311) Assessment: Improved  Her updated medication list for this problem includes:    Prozac 20 Mg Caps (Fluoxetine hcl) .Marland Kitchen... Take 1 tablet by mouth once a day  Problem # 3:  HYPERTENSION (ICD-401.9) Assessment: Deteriorated  Her updated medication list for this problem includes:    Cardizem Cd 180 Mg Cp24 (Diltiazem hcl coated beads) .Marland Kitchen... Take 1 tablet by mouth once a day    Hydrochlorothiazide 25 Mg Tabs (Hydrochlorothiazide) .Marland Kitchen... Take 1 tablet by mouth every morning    Benazepril Hcl 40 Mg Tabs (Benazepril hcl) .Marland Kitchen... Take 1 tablet by mouth once a day  Orders: T-Basic Metabolic Panel [id number])  BP today: 140/90 Prior BP: 128/80 (08/02/2009)  Labs Reviewed: K+: 3.9 (12/28/2009) Creat: : 0.69 (12/28/2009)   Chol: 157 (12/28/2009)   HDL: 58 (12/28/2009)   LDL: 82 (12/28/2009)   TG: 84 (12/28/2009)  Problem # 4:  OBESITY, UNSPECIFIED (ICD-278.00) Assessment: Improved  Ht: 66 (01/01/2010)   Wt: 207 (01/01/2010)   BMI: 33.53 (01/01/2010)  Problem # 5:  HYPERLIPIDEMIA (ICD-272.4) Assessment: Improved  Her updated medication list for this problem includes:    Lovastatin 40 Mg Tabs  (Lovastatin) ..... One tab by mouth at bedtime  Labs Reviewed: SGOT: 18 (12/28/2009)   SGPT: 24 (12/28/2009)   HDL:58 (12/28/2009), 57 (08/01/2009)  LDL:82 (12/28/2009), 86 (08/01/2009)  Chol:157 (12/28/2009), 170 (08/01/2009)  Trig:84 (12/28/2009), 135 (08/01/2009)  Complete Medication List: 1)  Prozac 20 Mg Caps (Fluoxetine hcl) .... Take 1 tablet by mouth once a day 2)  Cardizem Cd 180 Mg Cp24 (Diltiazem hcl coated beads) .... Take 1 tablet by mouth once a day 3)  Prevacid 30 Mg Cpdr (Lansoprazole) .... One cap by mouth once daily 4)  Excedrin Back & Body 250-250 Mg Tabs (Acetaminophen-aspirin buffered) .... Two tabs by mouth once daily 5)  Hydrochlorothiazide 25 Mg Tabs (Hydrochlorothiazide) .... Take 1 tablet by mouth every morning 6)  Oscal 500/200 D-3 500-200 Mg-unit Tabs (Calcium-vitamin d) .... One tab by mouth three times a day 7)  Certa-vite-lutein Tabs (Multiple vitamins-minerals) .... One tab by mouth once daily 8)  Lovastatin 40 Mg Tabs (Lovastatin) .... One tab by mouth at bedtime 9)  Ibuprofen 800 Mg Tabs (Ibuprofen) .... One tab by mouth two times a day as needed 10)  Benazepril Hcl 40 Mg Tabs (Benazepril hcl) .... Take 1 tablet by mouth once a day  Other Orders: T-Hepatic Function [phone]) T-Lipid Profile [id number]) Pap Smear (96295) Hemoccult Guaiac-1 spec.(in office) (28413)  Patient Instructions: 1)  Please schedule a follow-up appointment in 5.5 months. 2)  It is important that you exercise regularly at least 20 minutes 5 times a week. If you develop chest  pain, have severe difficulty breathing, or feel very tired , stop exercising immediately and seek medical attention. 3)  You need to lose weight. Consider a lower calorie diet and regular exercise.  4)  BMP prior to visit, ICD-9: 5)  Hepatic Panel prior to visit, ICD-9:  fasting in 5.5 months.  6)  Lipid Panel prior to visit, ICD-9: 7)  Check your Blood Pressure regularly. If it is above 150/100 you  should make an appointment. Prescriptions: HYDROCHLOROTHIAZIDE 25 MG TABS (HYDROCHLOROTHIAZIDE) Take 1 tablet by mouth every morning  #90 x 5   Entered by:   Everitt Amber   Authorized by:   Syliva Overman MD   Signed by:   Everitt Amber on 01/01/2010   Method used:   Electronically to        [facility] [phone]* (retail)       [address]       [place], [place]  [address]       Ph: [phone] or [phone]       Fax: [phone]   RxID:   [id number] PROZAC 20 MG CAPS (FLUOXETINE HCL) Take 1 tablet by mouth once a day  #30 Capsule x 5   Entered by:   Everitt Amber   Authorized by:   Syliva Overman MD   Signed by:   Everitt Amber on 01/01/2010   Method used:   Electronically to        [facility] [phone]* (retail)       [address]       [place], [place]  [address]       Ph: [phone] or [phone]       Fax: [phone]   RxID:   [id number]   Laboratory Results   Blood Tests   Date/Time Received: January 01, 2010  Date/Time Reported: January 01, 2010   HGBA1C: 6.4%   (Normal Range: Non-Diabetic - 3-6%   Control Diabetic - 6-8%)    Stool - Occult Blood Hemmoccult #1: negative Date: 01/01/2010 Comments: 51180 9 r 8/11 118/10/12

## 2010-12-20 NOTE — Progress Notes (Signed)
Summary: MEDICINE SEND TO [facility]  Phone Note Call from Patient   Summary of Call: NEEDS HER BLOOD PRESSURE MEDICINE AND DILTIAZEM SEND INTO [facility] AND LET HER KNOW WHEN THEY WILL SEND  IT TO [facility] Initial call taken by: Lind Guest,  December 13, 2010 3:00 PM    Prescriptions: CARDIZEM CD 180 MG CP24 (DILTIAZEM HCL COATED BEADS) Take 1 tablet by mouth once a day  #90 x 1   Entered by:   Adella Hare LPN   Authorized by:   Syliva Overman MD   Signed by:   Adella Hare LPN on 04/54/0981   Method used:   Electronically to        [facility]* (retail)       [address]       [address] Shipping/mailing       [place], [place]  [address]       Ph: [phone]       Fax: [phone]   RxID:   [id number] BENAZEPRIL-HYDROCHLOROTHIAZIDE 20-12.5 MG TABS (BENAZEPRIL-HYDROCHLOROTHIAZIDE) two tablets once daily  #180 x 1   Entered by:   Adella Hare LPN   Authorized by:   Syliva Overman MD   Signed by:   Adella Hare LPN on 36/64/4034   Method used:   Electronically to        [facility]* (retail)       [address]       [address] Shipping/mailing       [place], [place]  [address]       Ph: [phone]       Fax: [phone]   RxID:   [id number]

## 2010-12-20 NOTE — Assessment & Plan Note (Signed)
Summary: office visit   Vital Signs:  Patient profile:   60 year old female Menstrual status:  hysterectomy Height:      66 inches Weight:      211.75 pounds BMI:     34.30 O2 Sat:      96 % Pulse rate:   85 / minute Pulse rhythm:   regular Resp:     16 per minute BP sitting:   120 / 76  (left arm) Cuff size:   regular  Vitals Entered By: Everitt Amber LPN (November 28, 2010 1:55 PM)  Nutrition Counseling: Patient's BMI is greater than 25 and therefore counseled on weight management options. CC: Follow up chronic problems   CC:  Follow up chronic problems.  History of Present Illness: Reports  that she has been doing well. Denies recent fever or chills. Denies sinus pressure, nasal congestion , ear pain or sore throat. Denies chest congestion, or cough productive of sputum. Denies chest pain, palpitations, PND, orthopnea or leg swelling. Denies abdominal pain, nausea, vomitting, diarrhea or constipation. Denies change in bowel movements or bloody stool. Denies dysuria , frequency, incontinence or hesitancy. Denies  joint pain, swelling, or reduced mobility. Denies headaches, vertigo, seizures. Denies depression, anxiety or insomnia. Denies  rash, lesions, or itch. the pt has been diligent with increased physical activity and reduced carb intake     Current Medications (verified): 1)  Prozac 20 Mg Caps (Fluoxetine Hcl) .... Take 1 Tablet By Mouth Once A Day 2)  Cardizem Cd 180 Mg Cp24 (Diltiazem Hcl Coated Beads) .... Take 1 Tablet By Mouth Once A Day 3)  Prevacid 30 Mg  Cpdr (Lansoprazole) .... One Cap By Mouth Once Daily 4)  Excedrin Back & Body 250-250 Mg  Tabs (Acetaminophen-Aspirin Buffered) .... Two Tabs By Mouth Once Daily 5)  Hydrochlorothiazide 25 Mg Tabs (Hydrochlorothiazide) .... Take 1 Tablet By Mouth Every Morning 6)  Oscal 500/200 D-3 500-200 Mg-Unit  Tabs (Calcium-Vitamin D) .... One Tab By Mouth Three Times A Day 7)  Certa-Vite-Lutein   Tabs (Multiple  Vitamins-Minerals) .... One Tab By Mouth Once Daily 8)  Lovastatin 40 Mg  Tabs (Lovastatin) .... One Tab By Mouth At Bedtime 9)  Ibuprofen 800 Mg  Tabs (Ibuprofen) .... One Tab By Mouth Two Times A Day As Needed 10)  Benazepril Hcl 40 Mg Tabs (Benazepril Hcl) .... Take 1 Tablet By Mouth Once A Day 11)  Januvia 100 Mg Tabs (Sitagliptin Phosphate) .... Take 1 Tablet By Mouth Once A Day 12)  Tylenol 325 Mg Tabs (Acetaminophen) .... Take 1 Tablet By Mouth Once A Day  Allergies (verified): No Known Drug Allergies  Review of Systems      See HPI Eyes:  Complains of vision loss-both eyes; denies blurring, discharge, eye pain, and red eye. MS:  Complains of low back pain and mid back pain; occasional. Endo:  Denies cold intolerance, excessive hunger, excessive thirst, and excessive urination. Heme:  Denies abnormal bruising and bleeding. Allergy:  Denies hives or rash and itching eyes.  Physical Exam  General:  Well-developed,well-nourished,in no acute distress; alert,appropriate and cooperative throughout examination HEENT: No facial asymmetry,  EOMI, No sinus tenderness, TM's Clear, oropharynx  pink and moist.   Chest: Clear to auscultation bilaterally.  CVS: S1, S2, No murmurs, No S3.   Abd: Soft, Nontender.  MS: Adequate ROM spine, hips, shoulders and knees.  Ext: No edema.   CNS: CN 2-12 intact, power tone and sensation normal throughout.   Skin: Intact, no  visible lesions or rashes.  Psych: Good eye contact, normal affect.  Memory intact, not anxious or depressed appearing.    Impression & Recommendations:  Problem # 1:  DIABETES MELLITUS, TYPE II (ICD-250.00) Assessment Improved  The following medications were removed from the medication list:    Benazepril Hcl 40 Mg Tabs (Benazepril hcl) .Marland Kitchen... Take 1 tablet by mouth once a day    Januvia 100 Mg Tabs (Sitagliptin phosphate) .Marland Kitchen... Take 1 tablet by mouth once a day Her updated medication list for this problem includes:     Januvia 100 Mg Tabs (Sitagliptin phosphate) .Marland Kitchen... Take 1 tablet by mouth once a day    Benazepril-hydrochlorothiazide 20-12.5 Mg Tabs (Benazepril-hydrochlorothiazide) .Marland Kitchen..Marland Kitchen Two tablets once daily  Orders: T- Hemoglobin A1C ([id number]) T-Urine Microalbumin w/creat. ratio [id number])  Labs Reviewed: Creat: 0.66 (06/11/2010)    Reviewed HgBA1c results: 6.4 (11/21/2010)  6.7 (06/12/2010)  Problem # 2:  HYPERLIPIDEMIA (ICD-272.4) Assessment: Comment Only  Her updated medication list for this problem includes:    Lovastatin 40 Mg Tabs (Lovastatin) ..... One tab by mouth at bedtime Low fat dietdiscussed and encouraged  Orders: T-Hepatic Function [id number]) T-Lipid Profile [phone])  Labs Reviewed: SGOT: 16 (06/11/2010)   SGPT: 21 (06/11/2010)   HDL:53 (06/11/2010), 58 (12/28/2009)  LDL:87 (06/11/2010), 82 (12/28/2009)  Chol:163 (06/11/2010), 157 (12/28/2009)  Trig:116 (06/11/2010), 84 (12/28/2009)  Problem # 3:  HYPERTENSION (ICD-401.9) Assessment: Unchanged  The following medications were removed from the medication list:    Hydrochlorothiazide 25 Mg Tabs (Hydrochlorothiazide) .Marland Kitchen... Take 1 tablet by mouth every morning    Benazepril Hcl 40 Mg Tabs (Benazepril hcl) .Marland Kitchen... Take 1 tablet by mouth once a day Her updated medication list for this problem includes:    Cardizem Cd 180 Mg Cp24 (Diltiazem hcl coated beads) .Marland Kitchen... Take 1 tablet by mouth once a day    Benazepril-hydrochlorothiazide 20-12.5 Mg Tabs (Benazepril-hydrochlorothiazide) .Marland Kitchen..Marland Kitchen Two tablets once daily  Orders: T-Basic Metabolic Panel [phone])  BP today: 120/76 Prior BP: 120/78 (06/12/2010)  Labs Reviewed: K+: 4.1 (06/11/2010) Creat: : 0.66 (06/11/2010)   Chol: 163 (06/11/2010)   HDL: 53 (06/11/2010)   LDL: 87 (06/11/2010)   TG: 116 (06/11/2010)  Problem # 4:  OBESITY, UNSPECIFIED (ICD-278.00) Assessment: Improved  Ht: 66 (11/28/2010)   Wt: 211.75 (11/28/2010)   BMI: 34.30  (11/28/2010) therapeutic lifestyle change discussed and encouraged  Complete Medication List: 1)  Prozac 20 Mg Caps (Fluoxetine hcl) .... Take 1 tablet by mouth once a day 2)  Cardizem Cd 180 Mg Cp24 (Diltiazem hcl coated beads) .... Take 1 tablet by mouth once a day 3)  Prevacid 30 Mg Cpdr (Lansoprazole) .... One cap by mouth once daily 4)  Excedrin Back & Body 250-250 Mg Tabs (Acetaminophen-aspirin buffered) .... Two tabs by mouth once daily 5)  Oscal 500/200 D-3 500-200 Mg-unit Tabs (Calcium-vitamin d) .... One tab by mouth three times a day 6)  Certa-vite-lutein Tabs (Multiple vitamins-minerals) .... One tab by mouth once daily 7)  Lovastatin 40 Mg Tabs (Lovastatin) .... One tab by mouth at bedtime 8)  Ibuprofen 800 Mg Tabs (Ibuprofen) .... One tab by mouth two times a day as needed 9)  Tylenol 325 Mg Tabs (Acetaminophen) .... Take 1 tablet by mouth once a day 10)  Januvia 100 Mg Tabs (Sitagliptin phosphate) .... Take 1 tablet by mouth once a day 11)  Benazepril-hydrochlorothiazide 20-12.5 Mg Tabs (Benazepril-hydrochlorothiazide) .... Two tablets once daily  Other Orders: T-CBC w/Diff ([id number]) T-TSH [phone]) Radiology Referral (Radiology) Pneumococcal Vaccine (51884) Admin 1st  Vaccine (27253) Zoster (Shingles) Vaccine Live [id number]) Admin of Any Addtl Vaccine (34742)  Patient Instructions: 1)  CPE in 4 to 4.5 months 2)  It is important that you exercise regularly at least 30 minutes 5 times a week. If you develop chest pain, have severe difficulty breathing, or feel very tired , stop exercising immediately and seek medical attention. 3)  You need to lose weight. Consider a lower calorie diet and regular exercise.  4)  BMP prior to visit, ICD-9: 5)  Hepatic Panel prior to visit, ICD-9: 6)  Lipid Panel prior to visit, ICD-9: 7)  TSH prior to visit, ICD-9:  fasting in 4 months 8)  CBC w/ Diff prior to visit, ICD-9: 9)  HbgA1C prior to visit, ICD-9: 10)  Microalb  today. 11)  Pneumovac and zostavax  today 12)  Congrats  on lifestyle change 13)  plas start using the medlink svc thru the hospital we will give you the info. 14)  We will giove you the # for [facility] out pt pharmacy  [phone] Prescriptions: BENAZEPRIL-HYDROCHLOROTHIAZIDE 20-12.5 MG TABS (BENAZEPRIL-HYDROCHLOROTHIAZIDE) two tablets once daily  #180 x 3   Entered and Authorized by:   Syliva Overman MD   Signed by:   Syliva Overman MD on 11/28/2010   Method used:   Electronically to        [facility]* (retail)       [address]       [address] Shipping/mailing       [place], [place]  [address]       Ph: [phone]       Fax: [phone]   RxID:   [id number] JANUVIA 100 MG TABS (SITAGLIPTIN PHOSPHATE) Take 1 tablet by mouth once a day  #90 x 1   Entered and Authorized by:   Syliva Overman MD   Signed by:   Syliva Overman MD on 11/28/2010   Method used:   Electronically to        [facility]* (retail)       [address]       [address] Shipping/mailing       [place], [place]  [address]       Ph: [phone]       Fax: [phone]   RxID:   [id number]    Orders Added: 1)  Est. Patient Level IV [82993] 2)  T-Basic Metabolic Panel [80048-22910] 3)  T-Hepatic Function [80076-22960] 4)  T-Lipid Profile [80061-22930] 5)  T-CBC w/Diff [71696-78938] 6)  T- Hemoglobin A1C [83036-23375] 7)  T-TSH [10175-10258] 8)  T-Urine Microalbumin w/creat. ratio [82043-[address]] 9)  Radiology Referral [Radiology] 10)  Pneumococcal Vaccine [90732] 11)  Admin 1st Vaccine [90471] 12)  Zoster (Shingles) Vaccine Live [90736] 13)  Admin of Any Addtl Vaccine [90472]   Immunizations Administered:  Pneumonia Vaccine:    Vaccine Type: Pneumovax    Site: left deltoid    Mfr: Merck    Dose: 0.5 ml    Route: IM    Given by: Adella Hare LPN    Exp. Date: 02/04/2012    Lot #: [id number]    VIS given: 10/23/09 version given November 28, 2010.  Zostavax # 1:    Vaccine Type: Zostavax    Site: left deltoid    Mfr: Merck    Dose: 0.5 ml    Route: [place]    Given by: Adella Hare LPN    Exp. Date: 09/23/2011  Lot #: [id number]    VIS given: 08/30/05 given November 28, 2010.   Immunizations Administered:  Pneumonia Vaccine:    Vaccine Type: Pneumovax    Site: left deltoid    Mfr: Merck    Dose: 0.5 ml    Route: IM    Given by: Adella Hare LPN    Exp. Date: 02/04/2012    Lot #: [id number]    VIS given: 10/23/09 version given November 28, 2010.  Zostavax # 1:    Vaccine Type: Zostavax    Site: left deltoid    Mfr: Merck    Dose: 0.5 ml    Route: [place]    Given by: Adella Hare LPN    Exp. Date: 09/23/2011    Lot #: [id number]    VIS given: 08/30/05 given November 28, 2010.

## 2011-01-07 ENCOUNTER — Ambulatory Visit (HOSPITAL_COMMUNITY)
Admission: RE | Admit: 2011-01-07 | Discharge: 2011-01-07 | Disposition: A | Discharge status: Home / Self Care | Payer: 59 | Source: Ambulatory Visit | Attending: Family Medicine | Admitting: Family Medicine

## 2011-01-07 DIAGNOSIS — Z1239 Encounter for other screening for malignant neoplasm of breast: Secondary | ICD-10-CM

## 2011-01-07 DIAGNOSIS — Z1231 Encounter for screening mammogram for malignant neoplasm of breast: Secondary | ICD-10-CM | POA: Insufficient documentation

## 2011-01-15 ENCOUNTER — Telehealth: Payer: Self-pay | Admitting: Family Medicine

## 2011-01-24 NOTE — Progress Notes (Signed)
Summary: refill  Phone Note Call from Patient   Summary of Call: needs medicine sent to [facility]. pt needs lansoprazoledr, [phone] Initial call taken by: Rudene Anda,  January 15, 2011 9:11 AM    Prescriptions: PREVACID 30 MG  CPDR (LANSOPRAZOLE) one cap by mouth once daily  #90 x 0   Entered by:   Adella Hare LPN   Authorized by:   Syliva Overman MD   Signed by:   Adella Hare LPN on 45/40/9811   Method used:   Electronically to        [facility]* (retail)       [address]       [address] Shipping/mailing       [place], [place]  [address]       Ph: [phone]       Fax: [phone]   RxID:   [id number]

## 2011-01-31 ENCOUNTER — Telehealth: Payer: Self-pay | Admitting: Family Medicine

## 2011-01-31 ENCOUNTER — Encounter: Payer: Self-pay | Admitting: Family Medicine

## 2011-01-31 ENCOUNTER — Ambulatory Visit (INDEPENDENT_AMBULATORY_CARE_PROVIDER_SITE_OTHER): Payer: 59

## 2011-01-31 DIAGNOSIS — M25569 Pain in unspecified knee: Secondary | ICD-10-CM | POA: Insufficient documentation

## 2011-02-05 NOTE — Assessment & Plan Note (Signed)
Summary: injection per doctor patient said  Nurse Visit   Vital Signs:  Patient profile:   60 year old female Menstrual status:  hysterectomy Weight:      216.25 pounds BP sitting:   140 / 80  (left arm)  Vitals Entered By: Adella Hare LPN (January 31, 2011 3:22 PM) CC: complains of right knee pain x 2 months   CC:  complains of right knee pain x 2 months.   Allergies: No Known Drug Allergies  Medication Administration  Injection # 1:    Medication: Depo- Medrol 80mg     Diagnosis: KNEE PAIN, RIGHT (ICD-719.46)    Route: IM    Site: RUOQ gluteus    Exp Date: 11/12    Lot #: [id number]    Mfr: [email or web address]    Patient tolerated injection without complications    Given by: Adella Hare LPN (January 31, 2011 3:24 PM)  Injection # 2:    Medication: Ketorolac-Toradol 15mg     Diagnosis: KNEE PAIN, RIGHT (ICD-719.46)    Route: IM    Site: LUOQ gluteus    Exp Date: 06/18/2012    Lot #: [id number]    Mfr: [email or web address]    Comments: 60mg  given    Patient tolerated injection without complications    Given by: Adella Hare LPN (January 31, 2011 3:24 PM)  Orders Added: 1)  Depo- Medrol 80mg  [J1040] 2)  Ketorolac-Toradol 15mg  [J1885] 3)  Admin of Therapeutic Inj  intramuscular or subcutaneous [96372]   Medication Administration  Injection # 1:    Medication: Depo- Medrol 80mg     Diagnosis: KNEE PAIN, RIGHT (ICD-719.46)    Route: IM    Site: RUOQ gluteus    Exp Date: 11/12    Lot #: [id number]    Mfr: [email or web address]    Patient tolerated injection without complications    Given by: Adella Hare LPN (January 31, 2011 3:24 PM)  Injection # 2:    Medication: Ketorolac-Toradol 15mg     Diagnosis: KNEE PAIN, RIGHT (ICD-719.46)    Route: IM    Site: LUOQ gluteus    Exp Date: 06/18/2012    Lot #: [id number]    Mfr: novaplus    Comments: 60mg  given    Patient tolerated injection without complications    Given by: Adella Hare LPN (January 31, 2011 3:24 PM)  Orders Added: 1)  Depo- Medrol 80mg  [J1040] 2)   Ketorolac-Toradol 15mg  [J1885] 3)  Admin of Therapeutic Inj  intramuscular or subcutaneous [29562] Order given as above for knee and back pain, no adverse effects from  the injections were noted

## 2011-02-05 NOTE — Progress Notes (Signed)
  Phone Note Call from Patient   Summary of Call: back and hip pain x 2days requesting injection Initial call taken by: Syliva Overman MD,  January 31, 2011 2:51 PM  Follow-up for Phone Call        pls administer toradol 60mg  i and depomedrol 80mg  Im foor back and hip pain Follow-up by: Syliva Overman MD,  January 31, 2011 2:52 PM  Additional Follow-up for Phone Call Additional follow up Details #1::        noted Additional Follow-up by: Adella Hare LPN,  January 31, 2011 3:22 PM

## 2011-02-11 ENCOUNTER — Telehealth: Payer: Self-pay | Admitting: Family Medicine

## 2011-02-11 ENCOUNTER — Other Ambulatory Visit: Payer: Self-pay | Admitting: Family Medicine

## 2011-02-11 NOTE — Telephone Encounter (Signed)
sual plsThese requests for meds to be sent need to be routed to nursing staff as u

## 2011-02-12 MED ORDER — FLUOXETINE HCL 20 MG PO CAPS: 20.0000 mg | ORAL_CAPSULE | Freq: Every day | ORAL | Status: DC

## 2011-02-12 MED ORDER — LOVASTATIN 40 MG PO TABS: 40.0000 mg | ORAL_TABLET | Freq: Every day | ORAL | Status: DC

## 2011-02-12 NOTE — Telephone Encounter (Signed)
pls send 3 month supplies of the meds requested to the pharmacy requested

## 2011-04-02 NOTE — Assessment & Plan Note (Signed)
[facility]                       [facility] OFFICE NOTE   [name], STUMP                       MRN:          [id number]  DATE:09/30/2007                            DOB:          [birth year]    Sophia [name] is a delightful 60 year old employee of [employer] who is  referred by Dr. Lodema Hong for palpitations, chest pain, dyspnea, and  hypertension.   The patient has not had a previous cardiac workup.  She has had  longstanding hypertension.  She is on hyperlipidemia, she is  nondiabetic, and she does not smoke.   FAMILY HISTORY:  Negative.   The patient has been trying to walk more in the last three months to  keep her weight down.  She notices that she gets some chest pain in her  chest.  She does not think it is wheezing or related to any lung  problems.  She can continue to walk through it and it goes away.  It is  not really anginal sounding. However, it is clearly related to her  walking and she does not get rest pain.  Along with this, she  occasionally gets exertional dyspnea.  She has recounted this as  deconditioning or because of her weight.  There has been no history of  chronic lung disease.  She is a nonsmoker.  There is no history of DVT  or PE.   The patient has not had a cough or sputum production.  There has been no  PND or orthopnea.  No previous history of cardiomyopathy or heart  failure.   The patient's blood pressure has been fairly well controlled.  She is on  Cardizem and Benazepril which are both fairly new for her, but she  thinks they are working well.   Sometimes when she walks, she also gets palpitations.  These sound  benign, they are short, they are flip-flops.  There is no associated  syncope.  These symptoms of palpitations are not progressive.  They are  not always associated with her dyspnea.   REVIEW OF SYSTEMS:  Otherwise negative.   CURRENT MEDICATIONS:  1. Cardizem 180 mg a day.  2.  Prozac 20 mg a day.  3. Vitamin E.  4. Benazepril 20/12.5.  5. Calcium.  6. Prevacid.   PAST SURGICAL HISTORY:  Partial hysterectomy, lumpectomy.   ALLERGIES:  No known drug allergies.   SOCIAL HISTORY:  She is happily married.  Her husband is on disability  for sarcoid.  She has two children and one lives in [place] and  one in [place].  She has five grandchildren.  She enjoys walking  when she is not at work.  She does not smoke or drink.   FAMILY HISTORY:  Negative for premature coronary disease.  Mother died  at age 83 of some sort of blood disease with blood in her abdomen.  Father is still alive with high blood pressure.   PHYSICAL EXAMINATION:  GENERAL:  Remarkable for an overweight black  female in no distress.  Affect is appropriate and jovial.  VITAL SIGNS:  Blood pressure is  120/80, pulse 73 and regular,  respiratory rate 14, and weight 210.  HEENT:  Unremarkable.  Carotids normal without bruit.  No  lymphadenopathy, thyromegaly, or JVP elevation, and no bruits.  LUNGS:  Clear with diaphragmatic motion, no wheezing.  HEART:  S1 and S2 distant heart sounds.  PMI is not palpable.  ABDOMEN:  Benign.  Bowel sounds are positive.  No tenderness, no bruits,  no hepatosplenomegaly or hepatojugular reflux.  EXTREMITIES:  Distal pulses are intact with no edema.  NEUROLOGY:  Nonfocal, no muscular weakness.   Baseline EKG is normal.   IMPRESSION:  1. Chest pain, atypical, but it is exertional.  She will have a follow-      up stress Myoview and continue baby aspirin a day.  2. Hypertension, currently well controlled.  Continue Cardizem and      Benazepril hydrochlorothiazide.  It may be worthwhile to check a      BMET in the future to make sure she is not hypokalemic.  3. Palpitations.  They sound benign.  Continue calcium blocker.  If      they worsen, we can certainly switch her over to a beta blocker and      also check her potassium and magnesium since she is  on      hydrochlorothiazide.  4. Dyspnea.  Sounds functional.  She will have a two-dimensional      echocardiogram to make sure she does not have severe left      ventricular hypertrophy or other evidence of cardiomyopathy.  This      combined with her stress test should rule out significant coronary      artery disease.  She certainly does not have any evidence of left      ventricular hypertrophy on her electrocardiogram and her heart      examination is normal.  Further recommendations will be based on      the results of these two tests.     Sophia [name]. Eden Emms, MD, [facility]  Electronically Signed    PCN/MedQ  DD: 09/30/2007  DT: 10/01/2007  Job #: [id number]   cc:   Milus Mallick. Lodema Hong, M.D.

## 2011-04-02 NOTE — Procedures (Signed)
[name], Sophia [name]                ACCOUNT NO.:  [id number]   MEDICAL RECORD NO.:  [id number]          PATIENT TYPE:  OUT   LOCATION:  RAD                           FACILITY:  [facility]   PHYSICIAN:  Noralyn Pick. Eden Emms, MD, FACCDATE OF BIRTH:  1951/04/04   DATE OF PROCEDURE:  DATE OF DISCHARGE:                                ECHOCARDIOGRAM   INDICATIONS:  Dyspnea, hypertension, murmur.   Left ventricular cavity size was normal. There was mild LVH. Septal  thickness was 12 to 13 mm. EF was 60%. There were no wall motion  abnormalities. Mitral valve was structurally normal. Aortic valve was  trileaflet and sclerotic with minimal stenosis. The peak velocity was  1.7 meters per second with a peak gradient of 12 mmHg and a mean  gradient of 7 mmHg.   Right-sided cardiac chambers were normal. There was no evidence of cor  pulmonale. There was mild TR. Subcostal imaging revealed no atrial  septal defect, no source of embolus, no effusion.   IMPRESSION:  1. Mild left ventricular hypertrophy, ejection fraction 60%.  2. Aortic valve sclerosis to minimal stenosis, no aortic      insufficiency.  3. Mild left atrial enlargement.  4. Normal mitral valve.  5. Normal right ventricle.  6. No pericardial effusion.      Noralyn Pick. Eden Emms, MD, [facility]  Electronically Signed     PCN/MEDQ  D:  10/06/2007  T:  10/07/2007  Job:  [id number]   cc:   Noralyn Pick. Eden Emms, MD, [facility]  [address]  [address]  [place]  [place] [address]   Milus Mallick. Lodema Hong, M.D.  Fax: [phone]

## 2011-04-05 NOTE — Consult Note (Signed)
Sophia [name], Sophia [name]                ACCOUNT NO.:  [id number]   MEDICAL RECORD NO.:  [id number]          PATIENT TYPE:  AMB   LOCATION:  DAY                           FACILITY:  [facility]   PHYSICIAN:  Kassie Mends, M.D.      DATE OF BIRTH:  1951-07-02   DATE OF CONSULTATION:  DATE OF DISCHARGE:                                   CONSULTATION   Margaret E. Lodema Hong, M.D.  [address]  [place], [place] [address]   Dear Dr. Lodema Hong:   I am seeing Sophia [name] as a new patient consultation per your request.  I am  seeing her for chest pain and rectal bleeding.   Ms. Goosby is a 60 year old female who carries the diagnosis of  gastroesophageal reflux disease.  She describes it as real bad.  She  reports having a positive H. pylori serology for which she is currently  being treated with metronidazole, tetracycline, and Protonix once a day.  She reports having heartburn and indigestion, sometimes every day.  She  describes it as a squeezing pain.  She takes intermittently Zantac or  Protonix to alleviate her symptoms.  She also has difficulty swallowing  solid foods once a month.  Her difficulty swallowing has occurred over the  last year.  She denies any weight loss.  She rarely has lower abdominal  pain.  She denies any diarrhea or constipation.  She may feel a hemorrhoid  when she strains to have a bowel movement.  She denies any nausea or  vomiting.   She also reports having rectal bleeding.  She saw blood once this year.  She  describes a few drops in the bowl after having a bowel movement.  She  reports not having what appears to be a sigmoidoscopy years ago and was told  she had hemorrhoids.  She denies any rectal pain or rectal itching.   Her past medical history includes hypertension, gastroesophageal reflux  disease, and depression.  Her past surgical history includes a partial  hysterectomy, lysis of adhesions in 1960, and removal of a left breast and  axillary lump.  She is not  allergic to any medicines.  Her current  medications include Benazepril, metronidazole, tetracycline, fluoxetine,  multivitamin, Diltiazem, Excedrin, Protonix, MVR stomach relief. She has no  family history of colon cancer or colon polyps.  She had a paternal aunt  with breast cancer, and her grandmother possibly had liver cancer.  She is  married and has two children, age 3 and 47.  She is employed at the [employer].  She dips snuff.  She denies any alcohol use.  Her review of systems  is per the HPI, otherwise all systems are negative.   PHYSICAL EXAMINATION:  VITAL SIGNS:  Weight 214 pounds.  Height 5 foot, 9-  1/2 inches.  Body mass index 31.1.  Temperature 98.3, blood pressure 118/60,  pulse 74.  GENERAL:  She is in no apparent distress and is alert and  oriented x4.  HEENT:  Normocephalic and atraumatic.  Pupils are equal, round  and reactive to light.  Mouth:  No oral lesions.  Posterior pharynx without  erythema or exudate.  NECK:  Full range of motion.  No lymphadenopathy.  LUNGS:  Clear to auscultation bilaterally.  CARDIOVASCULAR:  Regular rhythm  with a 3/6 systolic ejection murmur, normal S1 and S2.  ABDOMEN:  Bowel  sounds present, soft, nontender, nondistended.  No rebound or guarding.  No  hepatosplenomegaly.  Obese.  EXTREMITIES:  Without clubbing, cyanosis or  edema.  NEURO: She has no focal neurological deficits.   [name] is a 60 year old female with chest pain, which may be related to  gastroesophageal reflux disease or non-acid reflux disease and a low  likelihood of valvular heart disease causing her symptoms.  Her rectal  bleeding may be due to hemorrhoids, but the differential diagnosis includes  colon polyp and the low likelihood of colorectal cancer.  Thank you for  allowing me to see Sophia [name] in consultation.  My recommendations follow.   RECOMMENDATIONS:  I recommend holding aspirin for five days and scheduling  an upper endoscopy for dysphagia and  colonoscopy for rectal bleeding/average  risk colon cancer screening.  I recommend a 10-15 pound weight loss.  She is  to continue her treatment for H. pylori.  She may follow up in three months.  I would consider checking an echocardiogram to further define the nature of  her valvular heart disease, if one has not been performed.   Please feel free to contact me at [phone] with additional questions.      Kassie Mends, M.D.  Electronically Signed     SM/MEDQ  D:  06/12/2006  T:  06/12/2006  Job:  [id number]   cc:   Milus Mallick. Lodema Hong, M.D.  Fax: [phone]

## 2011-04-05 NOTE — Op Note (Signed)
Sophia [name], Sophia [name]                ACCOUNT NO.:  [id number]   MEDICAL RECORD NO.:  [id number]          PATIENT TYPE:  AMB   LOCATION:  DAY                           FACILITY:  [facility]   PHYSICIAN:  Kassie Mends, M.D.      DATE OF BIRTH:  [birth year]   DATE OF PROCEDURE:  06/23/2006  DATE OF DISCHARGE:                                 OPERATIVE REPORT   PROCEDURES:  1. Esophagogastroduodenoscopy.  2. Colonoscopy.   INDICATION FOR EXAM:  Sophia [name] is a 60 year old female with recurrent  chest pain.  She presents for intermittent rectal bleeding and average risk  colon cancer screening.   MEDICATIONS:  1. Esophagogastroduodenoscopy - Demerol 50 mg IV and Versed 4 mg IV.  2. Colonoscopy - Demerol 50 mg IV and Versed 4 mg IV.   FINDINGS:  1. Normal esophagus without stricture mass inflammation or Barrett's      esophagus.  Normal stomach and duodenum.  2. Normal colon.  No evidence of diverticula, masses, polyps, inflammatory      changes or vascular ectasia.  Normal retroflexed view of the rectum.   RECOMMENDATIONS:  1. No source for recurrent chest pain identified.  Would consider      esophageal manometry/impedance.  2. Screening colonoscopy in  10 years.  3. Follow up with me for persistent dysphagia.   PROCEDURE TECHNIQUE:  Physical exam was performed and informed consent was  obtained from the patient after explaining all risks, benefits and  alternatives to the procedure which the patient appeared to understand and  so stated.  The patient was connected to the monitoring device and placed in  the left lateral position.  Continuous oxygen was provided by nasal cannula  and IV medicine administered through an indwelling cannula.  After  administration of sedation, the patient's esophagus was intubated and the  scope was advanced under direct visualization to the second portion of the  duodenum.  The scope was subsequently removed slowly by carefully examining  the color,  texture, anatomy and integrity of the mucosa on the way out.   After the EGD, a rectal exam was performed.  The patient's rectum was  intubated and the scope was advanced under direct visualization to the  cecum.  The scope was subsequently removed slowly by carefully examining the  color, texture, anatomy and integrity of the mucosa on the way out.  The  patient was recovered in the endoscopy suite and discharged to home in  satisfactory condition.      Kassie Mends, M.D.  Electronically Signed     SM/MEDQ  D:  06/23/2006  T:  06/23/2006  Job:  [id number]   cc:   Milus Mallick. Lodema Hong, M.D.  Fax: [phone]

## 2011-04-16 ENCOUNTER — Other Ambulatory Visit: Payer: Self-pay

## 2011-04-16 MED ORDER — LANSOPRAZOLE 30 MG PO CPDR: 30.0000 mg | DELAYED_RELEASE_CAPSULE | Freq: Every day | ORAL | Status: DC

## 2011-04-26 ENCOUNTER — Encounter: Payer: Self-pay | Admitting: Family Medicine

## 2011-04-29 ENCOUNTER — Other Ambulatory Visit: Payer: Self-pay | Admitting: Family Medicine

## 2011-04-29 LAB — BASIC METABOLIC PANEL
BUN: 15 mg/dL (ref 6–23)
Creat: 0.71 mg/dL (ref 0.50–1.10)

## 2011-04-29 LAB — LIPID PANEL
Cholesterol: 160 mg/dL (ref 0–200)
LDL Cholesterol: 73 mg/dL (ref 0–99)
Triglycerides: 163 mg/dL — ABNORMAL HIGH (ref <150)

## 2011-04-29 LAB — CBC WITH DIFFERENTIAL/PLATELET
Basophils Absolute: 0 10*3/uL (ref 0.0–0.1)
Basophils Relative: 1 % (ref 0–1)
Eosinophils Absolute: 0.2 10*3/uL (ref 0.0–0.7)
Eosinophils Relative: 3 % (ref 0–5)
HCT: 36.7 % (ref 36.0–46.0)
MCHC: 31.9 g/dL (ref 30.0–36.0)
MCV: 79.4 fL (ref 78.0–100.0)
Monocytes Absolute: 0.7 10*3/uL (ref 0.1–1.0)
RDW: 16.6 % — ABNORMAL HIGH (ref 11.5–15.5)

## 2011-04-29 LAB — HEMOGLOBIN A1C: Mean Plasma Glucose: 134 mg/dL — ABNORMAL HIGH (ref <117)

## 2011-04-29 LAB — HEPATIC FUNCTION PANEL
Alkaline Phosphatase: 62 U/L (ref 39–117)
Total Protein: 7.1 g/dL (ref 6.0–8.3)

## 2011-05-01 ENCOUNTER — Ambulatory Visit (INDEPENDENT_AMBULATORY_CARE_PROVIDER_SITE_OTHER): Payer: 59 | Admitting: Family Medicine

## 2011-05-01 ENCOUNTER — Encounter: Payer: Self-pay | Admitting: Family Medicine

## 2011-05-01 VITALS — BP 130/80 | HR 87 | Resp 16 | Ht 67.5 in | Wt 219.0 lb

## 2011-05-01 DIAGNOSIS — J302 Other seasonal allergic rhinitis: Secondary | ICD-10-CM | POA: Insufficient documentation

## 2011-05-01 DIAGNOSIS — J309 Allergic rhinitis, unspecified: Secondary | ICD-10-CM

## 2011-05-01 DIAGNOSIS — F329 Major depressive disorder, single episode, unspecified: Secondary | ICD-10-CM

## 2011-05-01 DIAGNOSIS — E119 Type 2 diabetes mellitus without complications: Secondary | ICD-10-CM

## 2011-05-01 DIAGNOSIS — D509 Iron deficiency anemia, unspecified: Secondary | ICD-10-CM

## 2011-05-01 DIAGNOSIS — I10 Essential (primary) hypertension: Secondary | ICD-10-CM

## 2011-05-01 DIAGNOSIS — E785 Hyperlipidemia, unspecified: Secondary | ICD-10-CM

## 2011-05-01 LAB — ANEMIA PANEL
%SAT: 14 % — ABNORMAL LOW (ref 20–55)
ABS Retic: 73.4 10*3/uL (ref 19.0–186.0)
Iron: 44 ug/dL (ref 42–145)

## 2011-05-01 MED ORDER — FERROUS SULFATE 325 (65 FE) MG PO TABS: 325.0000 mg | ORAL_TABLET | Freq: Every day | ORAL | Status: DC

## 2011-05-01 MED ORDER — METHYLPREDNISOLONE ACETATE 80 MG/ML IJ SUSP
80.0000 mg | Freq: Once | INTRAMUSCULAR | Status: AC
  Administered 2011-05-01: 80 mg via INTRAMUSCULAR

## 2011-05-01 MED ORDER — PREDNISONE (PAK) 5 MG PO TABS
5.0000 mg | ORAL_TABLET | ORAL | Status: AC
Start: 2011-05-01 — End: 2011-05-07

## 2011-05-01 MED ORDER — FLUTICASONE PROPIONATE 50 MCG/ACT NA SUSP: 1.0000 | Freq: Every day | NASAL | Status: DC

## 2011-05-01 NOTE — Patient Instructions (Addendum)
CPe in November.  Pls stop snuff  You will get an injection in the office for allergies.  You will get mucinex d take 1 daily for the next 5 days , this will help your allergies.  You need to use saline nasal washes regularly to help nasal congestion.  Pls cut back on candy, bread etc, eat more veg and fruit.  You need to start once daily iron.  Flonase spray to be started for alergies daily.Prednisone dose apck also sent in  CBC and iron level in nov also , fasting lipid, cmp and EGFR, and HBA1C

## 2011-05-02 ENCOUNTER — Encounter: Payer: Self-pay | Admitting: Family Medicine

## 2011-05-02 NOTE — Assessment & Plan Note (Signed)
Controlled, no change in medication  

## 2011-05-02 NOTE — Assessment & Plan Note (Signed)
Uncontrolled, depomedrol in office followed by prednisone dose pack  And daily flonase

## 2011-05-02 NOTE — Assessment & Plan Note (Signed)
Pt needs to start supplemental iron once daily

## 2011-05-02 NOTE — Progress Notes (Signed)
  Subjective:    Patient ID: Sophia [name], female    DOB: 03/19/1951, 60 y.o.   MRN: [id number]  HPI The PT is here for follow up and re-evaluation of chronic medical conditions, medication management and review of recent lab and radiology data.  Preventive health is updated, specifically  Cancer screening, Osteoporosis screening and Immunization.   Questions or concerns regarding consultations or procedures which the PT has had in the interim are  addressed. The PT denies any adverse reactions to current medications since the last visit.  C/o uncontrolled nasal and head congestion, sneezing, post nasal drainage and cough x 5 days. No fever or chills, drainage is clear. She has not been as diligent with diet as she had planned to , and has gained a few [pounds , also lipid control is not yet at goal, plans to work on this for the next several months. Blood sugar ranges between 90 to 120, no hypoglycemic episodes    Review of Systems  Denies chest pains, palpitations, paroxysmal nocturnal dyspnea, orthopnea and leg swelling Denies abdominal pain, nausea, vomiting,diarrhea or constipation.  Denies rectal bleeding or change in bowel movement. Denies dysuria, frequency, hesitancy or incontinence. Denies joint pain, swelling and limitation in mobility. Denies headaches, seizure, numbness, or tingling. Denies depression, anxiety or insomnia. Denies skin break down or rash.        Objective:   Physical Exam Patient alert and oriented and in no Cardiopulmonary distress.  HEENT: No facial asymmetry, EOMI, frontal and maxillary  sinus tenderness, TM's clear, Oropharynx pink and moist.  Neck supple no adenopathy.Erythema and edema of nasal mucosa  Chest: Clear to auscultation bilaterally.  CVS: S1, S2 no murmurs, no S3.  ABD: Soft non tender. Bowel sounds normal.  Ext: No edema  MS: Adequate ROM spine, shoulders, hips and knees.  Skin: Intact, no ulcerations or rash noted.  Psych:  Good eye contact, normal affect. Memory intact not anxious or depressed appearing.  CNS: CN 2-12 intact, power, tone and sensation normal throughout.        Assessment & Plan:

## 2011-05-15 ENCOUNTER — Other Ambulatory Visit: Payer: Self-pay | Admitting: Family Medicine

## 2011-06-06 ENCOUNTER — Other Ambulatory Visit: Payer: Self-pay

## 2011-06-06 MED ORDER — LOVASTATIN 40 MG PO TABS: 40.0000 mg | ORAL_TABLET | Freq: Every day | ORAL | Status: DC

## 2011-06-06 MED ORDER — SITAGLIPTIN PHOSPHATE 100 MG PO TABS: 100.0000 mg | ORAL_TABLET | Freq: Every day | ORAL | Status: DC

## 2011-06-11 ENCOUNTER — Other Ambulatory Visit: Payer: Self-pay

## 2011-06-11 ENCOUNTER — Telehealth: Payer: Self-pay | Admitting: Family Medicine

## 2011-06-11 MED ORDER — ROSUVASTATIN CALCIUM 10 MG PO TABS: 10.0000 mg | ORAL_TABLET | Freq: Every day | ORAL | Status: DC

## 2011-06-11 MED ORDER — SITAGLIPTIN PHOSPHATE 100 MG PO TABS: 100.0000 mg | ORAL_TABLET | Freq: Every day | ORAL | Status: DC

## 2011-06-11 MED ORDER — LOVASTATIN 40 MG PO TABS: 40.0000 mg | ORAL_TABLET | Freq: Every day | ORAL | Status: DC

## 2011-06-11 NOTE — Telephone Encounter (Signed)
Pharmacy aware and called patient left message

## 2011-06-11 NOTE — Telephone Encounter (Signed)
Drug interaction warning with diltiazem and lovastatin, pls advise pharmacy and pt , d/c lovastatin, staert crestor 10mg  one at night erx 90 refill 1,this is [facility] out pt pharmacy

## 2011-06-12 NOTE — Telephone Encounter (Signed)
Patient aware and also sent in her dm med as requested

## 2011-06-17 ENCOUNTER — Other Ambulatory Visit: Payer: Self-pay | Admitting: Family Medicine

## 2011-06-19 ENCOUNTER — Other Ambulatory Visit: Payer: Self-pay | Admitting: *Deleted

## 2011-06-19 ENCOUNTER — Other Ambulatory Visit: Payer: Self-pay

## 2011-06-19 MED ORDER — FLUOXETINE HCL 20 MG PO CAPS: ORAL_CAPSULE | ORAL | Status: DC

## 2011-06-19 MED ORDER — DILTIAZEM HCL ER COATED BEADS 180 MG PO CP24: 180.0000 mg | ORAL_CAPSULE | Freq: Every day | ORAL | Status: DC

## 2011-09-25 LAB — COMPLETE METABOLIC PANEL WITH GFR
Alkaline Phosphatase: 64 U/L (ref 39–117)
BUN: 12 mg/dL (ref 6–23)
CO2: 30 mEq/L (ref 19–32)
Creat: 0.65 mg/dL (ref 0.50–1.10)
GFR, Est African American: >89 mL/min (ref >60)
GFR, Est Non African American: >89 mL/min (ref >60)
Glucose, Bld: 100 mg/dL — ABNORMAL HIGH (ref 70–99)
Sodium: 142 mEq/L (ref 135–145)
Total Bilirubin: 0.3 mg/dL (ref 0.3–1.2)

## 2011-09-25 LAB — CBC WITH DIFFERENTIAL/PLATELET
Eosinophils Absolute: 0.2 10*3/uL (ref 0.0–0.7)
Hemoglobin: 12.5 g/dL (ref 12.0–15.0)
Lymphocytes Relative: 43 % (ref 12–46)
Lymphs Abs: 2.2 10*3/uL (ref 0.7–4.0)
MCH: 25.9 pg — ABNORMAL LOW (ref 26.0–34.0)
MCV: 83.2 fL (ref 78.0–100.0)
Monocytes Relative: 10 % (ref 3–12)
Neutrophils Relative %: 42 % — ABNORMAL LOW (ref 43–77)
RBC: 4.83 MIL/uL (ref 3.87–5.11)
WBC: 5.1 10*3/uL (ref 4.0–10.5)

## 2011-09-25 LAB — LIPID PANEL
Cholesterol: 136 mg/dL (ref 0–200)
HDL: 55 mg/dL (ref >39)
Total CHOL/HDL Ratio: 2.5 Ratio
Triglycerides: 78 mg/dL (ref <150)

## 2011-09-25 LAB — HEMOGLOBIN A1C
Hgb A1c MFr Bld: 6.6 % — ABNORMAL HIGH (ref <5.7)
Mean Plasma Glucose: 143 mg/dL — ABNORMAL HIGH (ref <117)

## 2011-09-25 LAB — IRON: Iron: 58 ug/dL (ref 42–145)

## 2011-09-30 ENCOUNTER — Encounter: Payer: Self-pay | Admitting: Family Medicine

## 2011-10-01 ENCOUNTER — Ambulatory Visit (INDEPENDENT_AMBULATORY_CARE_PROVIDER_SITE_OTHER): Payer: 59 | Admitting: Family Medicine

## 2011-10-01 ENCOUNTER — Encounter: Payer: Self-pay | Admitting: Family Medicine

## 2011-10-01 VITALS — BP 120/80 | HR 79 | Resp 16 | Ht 67.5 in | Wt 215.0 lb

## 2011-10-01 DIAGNOSIS — E785 Hyperlipidemia, unspecified: Secondary | ICD-10-CM

## 2011-10-01 DIAGNOSIS — I10 Essential (primary) hypertension: Secondary | ICD-10-CM

## 2011-10-01 DIAGNOSIS — E119 Type 2 diabetes mellitus without complications: Secondary | ICD-10-CM

## 2011-10-01 DIAGNOSIS — E669 Obesity, unspecified: Secondary | ICD-10-CM

## 2011-10-01 DIAGNOSIS — F329 Major depressive disorder, single episode, unspecified: Secondary | ICD-10-CM

## 2011-10-01 NOTE — Patient Instructions (Addendum)
cPE in 4 months..  Your labs and blood pressure are excellent.  Please attend class.   You need to stop nicotine products  HBa1C in 4 months, non fasting  It is important that you exercise regularly at least 30 minutes 5 times a week. If you develop chest pain, have severe difficulty breathing, or feel very tired, stop exercising immediately and seek medical attention    A healthy diet is rich in fruit, vegetables and whole grains. Poultry fish, nuts and beans are a healthy choice for protein rather then red meat. A low sodium diet and drinking 64 ounces of water daily is generally recommended. Oils and sweet should be limited. Carbohydrates especially for those who are diabetic or overweight, should be limited to 30-45 gram per meal. It is important to eat on a regular schedule, at least 3 times daily. Snacks should be primarily fruits, vegetables or nuts.

## 2011-10-01 NOTE — Progress Notes (Signed)
  Subjective:    Patient ID: Sophia [name], female    DOB: Oct 02, 1951, 60 y.o.   MRN: [id number]  HPI The PT is here for follow up and re-evaluation of chronic medical conditions, medication management and review of any available recent lab and radiology data.  Preventive health is updated, specifically  Cancer screening and Immunization.   Questions or concerns regarding consultations or procedures which the PT has had in the interim are  addressed. The PT denies any adverse reactions to current medications since the last visit.  There are no new concerns.  There are no specific complaints       Review of Systems See HPI Denies recent fever or chills. Denies sinus pressure, nasal congestion, ear pain or sore throat. Denies chest congestion, productive cough or wheezing. Denies chest pains, palpitations and leg swelling Denies abdominal pain, nausea, vomiting,diarrhea or constipation.   Denies dysuria, frequency, hesitancy or incontinence. Denies joint pain, swelling and limitation in mobility. Denies headaches, seizures, numbness, or tingling. Denies depression, anxiety or insomnia. Denies skin break down or rash.        Objective:   Physical Exam Patient alert and oriented and in no cardiopulmonary distress.  HEENT: No facial asymmetry, EOMI, no sinus tenderness,  oropharynx pink and moist.  Neck supple no adenopathy.  Chest: Clear to auscultation bilaterally.  CVS: S1, S2 no murmurs, no S3.  ABD: Soft non tender. Bowel sounds normal.  Ext: No edema  MS: Adequate ROM spine, shoulders, hips and knees.  Skin: Intact, no ulcerations or rash noted.  Psych: Good eye contact, normal affect. Memory intact not anxious or depressed appearing.  CNS: CN 2-12 intact, power, tone and sensation normal throughout.        Assessment & Plan:

## 2011-10-06 NOTE — Assessment & Plan Note (Signed)
Controlled, no change in medication  

## 2011-10-06 NOTE — Assessment & Plan Note (Signed)
Improved. Pt applauded on succesful weight loss through lifestyle change, and encouraged to continue same. Weight loss goal set for the next several months.  

## 2011-10-06 NOTE — Assessment & Plan Note (Signed)
Hyperlipidemia:Low fat diet discussed and encouraged.  Improved, no med change    

## 2011-10-06 NOTE — Assessment & Plan Note (Signed)
Improved and controlled  

## 2011-10-07 ENCOUNTER — Other Ambulatory Visit: Payer: Self-pay | Admitting: Family Medicine

## 2011-11-20 ENCOUNTER — Ambulatory Visit (INDEPENDENT_AMBULATORY_CARE_PROVIDER_SITE_OTHER): Payer: 59 | Admitting: Family Medicine

## 2011-11-20 ENCOUNTER — Encounter: Payer: Self-pay | Admitting: Family Medicine

## 2011-11-20 ENCOUNTER — Telehealth: Payer: Self-pay

## 2011-11-20 VITALS — BP 114/74 | HR 86 | Temp 98.3°F | Resp 16 | Ht 67.5 in | Wt 220.0 lb

## 2011-11-20 DIAGNOSIS — J029 Acute pharyngitis, unspecified: Secondary | ICD-10-CM

## 2011-11-20 DIAGNOSIS — J40 Bronchitis, not specified as acute or chronic: Secondary | ICD-10-CM

## 2011-11-20 DIAGNOSIS — I10 Essential (primary) hypertension: Secondary | ICD-10-CM

## 2011-11-20 DIAGNOSIS — E785 Hyperlipidemia, unspecified: Secondary | ICD-10-CM

## 2011-11-20 DIAGNOSIS — E119 Type 2 diabetes mellitus without complications: Secondary | ICD-10-CM

## 2011-11-20 MED ORDER — PENICILLIN V POTASSIUM 500 MG PO TABS: 500.0000 mg | ORAL_TABLET | Freq: Three times a day (TID) | ORAL | Status: AC

## 2011-11-20 MED ORDER — BENZONATATE 100 MG PO CAPS: 100.0000 mg | ORAL_CAPSULE | Freq: Four times a day (QID) | ORAL | Status: DC | PRN

## 2011-11-20 NOTE — Telephone Encounter (Signed)
Pt aware.

## 2011-11-20 NOTE — Patient Instructions (Signed)
F/u as before .  You are being treated for pharyngitis and bronchitis, medication is sent to [facility]

## 2011-11-20 NOTE — Telephone Encounter (Signed)
pls call let her know thatr she neeeds to come over for work in by 2:45 today, I will notify front staff

## 2011-11-24 DIAGNOSIS — J029 Acute pharyngitis, unspecified: Secondary | ICD-10-CM | POA: Insufficient documentation

## 2011-11-24 NOTE — Assessment & Plan Note (Signed)
Controlled, no change in medication  

## 2011-11-24 NOTE — Progress Notes (Signed)
  Subjective:    Patient ID: Elease Hashimoto, female    DOB: Feb 25, 1951, 61 y.o.   MRN: [id number]  HPI 3 day h/o sore throat, ear pain and chest congestion with productive cough which is worsening. She works in a health care facility, states everyone is sick and she is not improving . Reports intermittent chills , no documented fever.   Review of Systems See HPI Denies chest pains, palpitations and leg swelling Denies abdominal pain, nausea, vomiting,diarrhea or constipation.   Denies dysuria, frequency, hesitancy or incontinence. Denies joint pain, swelling and limitation in mobility. Denies headaches, seizures, numbness, or tingling. Denies depression, anxiety or insomnia. Denies skin break down or rash.        Objective:   Physical Exam Patient alert and oriented and in no cardiopulmonary distress.  HEENT: No facial asymmetry, EOMI, no sinus tenderness,  oropharynx erythematous , no exudate, and moist.  Neck supple, anterior adenopathy.  Chest: decreased air entry, scattered crackles  CVS: S1, S2 no murmurs, no S3.  ABD: Soft non tender. Bowel sounds normal.  Ext: No edema  MS: Adequate ROM spine, shoulders, hips and knees.  Skin: Intact, no ulcerations or rash noted.  Psych: Good eye contact, normal affect. Memory intact not anxious or depressed appearing.  CNS: CN 2-12 intact, power, tone and sensation normal throughout.        Assessment & Plan:

## 2011-11-24 NOTE — Assessment & Plan Note (Signed)
Acute onset antibiotic prescribed

## 2011-11-24 NOTE — Assessment & Plan Note (Signed)
Acute symptom, antibiotic prescribed

## 2011-11-24 NOTE — Assessment & Plan Note (Signed)
Hyperlipidemia:Low fat diet discussed and encouraged.   

## 2011-12-09 ENCOUNTER — Other Ambulatory Visit: Payer: Self-pay | Admitting: Family Medicine

## 2011-12-17 ENCOUNTER — Other Ambulatory Visit: Payer: Self-pay | Admitting: Family Medicine

## 2011-12-19 ENCOUNTER — Other Ambulatory Visit: Payer: Self-pay | Admitting: Family Medicine

## 2012-01-02 ENCOUNTER — Other Ambulatory Visit: Payer: Self-pay

## 2012-01-02 MED ORDER — ROSUVASTATIN CALCIUM 10 MG PO TABS: 10.0000 mg | ORAL_TABLET | Freq: Every day | ORAL | Status: DC

## 2012-01-09 ENCOUNTER — Telehealth (HOSPITAL_COMMUNITY): Payer: Self-pay | Admitting: Dietician

## 2012-01-09 NOTE — Telephone Encounter (Signed)
Pt was scheduled to attend diabetes class on 01/09/12. Pt did not attend class.

## 2012-01-20 ENCOUNTER — Other Ambulatory Visit: Payer: Self-pay | Admitting: Family Medicine

## 2012-01-20 DIAGNOSIS — Z139 Encounter for screening, unspecified: Secondary | ICD-10-CM

## 2012-01-27 ENCOUNTER — Ambulatory Visit (HOSPITAL_COMMUNITY)
Admission: RE | Admit: 2012-01-27 | Discharge: 2012-01-27 | Disposition: A | Discharge status: Home / Self Care | Payer: 59 | Source: Ambulatory Visit | Attending: Family Medicine | Admitting: Family Medicine

## 2012-01-27 DIAGNOSIS — Z1231 Encounter for screening mammogram for malignant neoplasm of breast: Secondary | ICD-10-CM | POA: Insufficient documentation

## 2012-01-27 DIAGNOSIS — Z139 Encounter for screening, unspecified: Secondary | ICD-10-CM

## 2012-01-30 ENCOUNTER — Encounter (HOSPITAL_COMMUNITY): Payer: Self-pay | Admitting: Dietician

## 2012-01-30 NOTE — Progress Notes (Signed)
[facility] Diabetes Class Completion  Date:January 30, 2012  Time: 6:30 PM  [facility] attended [name] Diabetes Class on January 30, 2012.   Patient was educated on the following topics: carbohydrate metabolism in relation to diabetes, sources of carbohydrate, carbohydrate counting, meal planning strategies, food label reading, and portion control.   Melody Haver, RD, LDN Date:January 30, 2012 Time: 6:30 PM

## 2012-02-04 ENCOUNTER — Encounter: Payer: 59 | Admitting: Family Medicine

## 2012-02-25 ENCOUNTER — Encounter: Payer: Self-pay | Admitting: Family Medicine

## 2012-02-25 ENCOUNTER — Other Ambulatory Visit (HOSPITAL_COMMUNITY)
Admission: RE | Admit: 2012-02-25 | Discharge: 2012-02-25 | Disposition: A | Discharge status: Home / Self Care | Payer: 59 | Source: Ambulatory Visit | Attending: Family Medicine | Admitting: Family Medicine

## 2012-02-25 ENCOUNTER — Ambulatory Visit (INDEPENDENT_AMBULATORY_CARE_PROVIDER_SITE_OTHER): Payer: 59 | Admitting: Family Medicine

## 2012-02-25 VITALS — BP 130/82 | HR 69 | Resp 16 | Ht 67.5 in | Wt 214.4 lb

## 2012-02-25 DIAGNOSIS — F329 Major depressive disorder, single episode, unspecified: Secondary | ICD-10-CM

## 2012-02-25 DIAGNOSIS — B351 Tinea unguium: Secondary | ICD-10-CM

## 2012-02-25 DIAGNOSIS — Z139 Encounter for screening, unspecified: Secondary | ICD-10-CM

## 2012-02-25 DIAGNOSIS — J309 Allergic rhinitis, unspecified: Secondary | ICD-10-CM

## 2012-02-25 DIAGNOSIS — E739 Lactose intolerance, unspecified: Secondary | ICD-10-CM

## 2012-02-25 DIAGNOSIS — E119 Type 2 diabetes mellitus without complications: Secondary | ICD-10-CM

## 2012-02-25 DIAGNOSIS — R5383 Other fatigue: Secondary | ICD-10-CM

## 2012-02-25 DIAGNOSIS — Z01419 Encounter for gynecological examination (general) (routine) without abnormal findings: Secondary | ICD-10-CM | POA: Insufficient documentation

## 2012-02-25 DIAGNOSIS — R5381 Other malaise: Secondary | ICD-10-CM

## 2012-02-25 DIAGNOSIS — Z0000 Encounter for general adult medical examination without abnormal findings: Secondary | ICD-10-CM

## 2012-02-25 DIAGNOSIS — R7301 Impaired fasting glucose: Secondary | ICD-10-CM

## 2012-02-25 DIAGNOSIS — J302 Other seasonal allergic rhinitis: Secondary | ICD-10-CM

## 2012-02-25 DIAGNOSIS — I10 Essential (primary) hypertension: Secondary | ICD-10-CM

## 2012-02-25 DIAGNOSIS — E785 Hyperlipidemia, unspecified: Secondary | ICD-10-CM

## 2012-02-25 MED ORDER — LANSOPRAZOLE 30 MG PO CPDR: DELAYED_RELEASE_CAPSULE | ORAL | Status: DC

## 2012-02-25 MED ORDER — SITAGLIPTIN PHOSPHATE 100 MG PO TABS: ORAL_TABLET | ORAL | Status: DC

## 2012-02-25 MED ORDER — FLUTICASONE PROPIONATE 50 MCG/ACT NA SUSP: 1.0000 | Freq: Every day | NASAL | Status: DC

## 2012-02-25 MED ORDER — TERBINAFINE HCL 250 MG PO TABS: 250.0000 mg | ORAL_TABLET | Freq: Every day | ORAL | Status: DC

## 2012-02-25 NOTE — Patient Instructions (Addendum)
F/u in 4 month  Congrats on 6 pound weight loss since January. Blood sugar control has also improved significantly.   New medication terbinafine sent in for toenail fungus, take daily for 12 weeks total   You will get a coupon for crestor.  Fasting labs in 4 month

## 2012-02-25 NOTE — Progress Notes (Signed)
  Subjective:    Patient ID: Elease Hashimoto, female    DOB: 1951/02/26, 61 y.o.   MRN: [id number]  HPI The PT is here for annual exam and re-evaluation of chronic medical conditions, medication management and review of any available recent lab and radiology data.  Preventive health is updated, specifically  Cancer screening and Immunization.   Questions or concerns regarding consultations or procedures which the PT has had in the interim are  addressed. The PT denies any adverse reactions to current medications since the last visit.  She continues to work on lifestyle change with success   Review of Systems See HPI Denies recent fever or chills. Denies sinus pressure, nasal congestion, ear pain or sore throat. Denies chest congestion, productive cough or wheezing. Denies chest pains, palpitations and leg swelling Denies abdominal pain, nausea, vomiting,diarrhea or constipation.   Denies dysuria, frequency, hesitancy or incontinence. Denies joint pain, swelling and limitation in mobility. Denies headaches, seizures, numbness, or tingling. Denies depression, anxiety or insomnia. Denies skin break down or rash.        Objective:   Physical Exam Pleasant well nourished female, alert and oriented x 3, in no cardio-pulmonary distress. Afebrile. HEENT No facial trauma or asymetry. Sinuses non tender.  EOMI, PERTL, fundoscopic exam is normal, no hemorhage or exudate.  External ears normal, tympanic membranes clear. Oropharynx moist, no exudate, poor dentition. Neck: supple, no adenopathy,JVD or thyromegaly.No bruits.  Chest: Clear to ascultation bilaterally.No crackles or wheezes. Non tender to palpation  Breast: No asymetry,no masses. No nipple discharge or inversion. No axillary or supraclavicular adenopathy  Cardiovascular system; Heart sounds normal,  S1 and  S2 ,no S3.  No murmur, or thrill. Apical beat not displaced Peripheral pulses normal.  Abdomen: Soft, non  tender, no organomegaly or masses. No bruits. Bowel sounds normal. No guarding, tenderness or rebound.  Rectal:  No mass. Guaiac negative stool.  GU: External genitalia normal. No lesions. Vaginal canal normal.No discharge. Uterus absent, no adnexal masses, no  adnexal tenderness.  Musculoskeletal exam: Full ROM of spine, hips , shoulders and knees. No deformity ,swelling or crepitus noted. No muscle wasting or atrophy.   Neurologic: Cranial nerves 2 to 12 intact. Power, tone ,sensation and reflexes normal throughout. No disturbance in gait. No tremor.  Skin: Intact, no ulceration, erythema , scaling or rash noted.onychomycosis of all toenails Pigmentation normal throughout  Psych; Normal mood and affect. Judgement and concentration normal  Diabetic Foot Check:  Appearance - no calluses Skin - no unusual pallor or redness, tinea pedis and onychomycosis Sensation - grossly intact to light touch Monofilament testing -  Right - Great toe, medial, central, lateral ball and posterior foot intact Left - Great toe, medial, central, lateral ball and posterior foot intact Pulses Left - Dorsalis Pedis and Posterior Tibia normal Right - Dorsalis Pedis and Posterior Tibia normal       Assessment & Plan:

## 2012-03-08 NOTE — Assessment & Plan Note (Signed)
Controlled, no change in medication  

## 2012-03-08 NOTE — Assessment & Plan Note (Signed)
Infection involving most toenails, oral medication prescribed for 12 weeks total

## 2012-03-08 NOTE — Assessment & Plan Note (Signed)
Improved, pt applauded on this 

## 2012-06-25 ENCOUNTER — Ambulatory Visit: Payer: 59 | Admitting: Family Medicine

## 2012-07-07 ENCOUNTER — Other Ambulatory Visit: Payer: Self-pay

## 2012-07-07 MED ORDER — FLUOXETINE HCL 20 MG PO CAPS: 20.0000 mg | ORAL_CAPSULE | Freq: Every day | ORAL | Status: DC

## 2012-07-14 ENCOUNTER — Other Ambulatory Visit: Payer: Self-pay | Admitting: Family Medicine

## 2012-07-15 LAB — COMPLETE METABOLIC PANEL WITH GFR
ALT: 22 U/L (ref 0–35)
Alkaline Phosphatase: 60 U/L (ref 39–117)
CO2: 31 mEq/L (ref 19–32)
Creat: 0.68 mg/dL (ref 0.50–1.10)
GFR, Est African American: >89 mL/min
GFR, Est Non African American: >89 mL/min
Total Bilirubin: 0.3 mg/dL (ref 0.3–1.2)

## 2012-07-15 LAB — LIPID PANEL
HDL: 47 mg/dL (ref >39)
LDL Cholesterol: 61 mg/dL (ref 0–99)
Total CHOL/HDL Ratio: 2.9 Ratio
Triglycerides: 129 mg/dL (ref <150)

## 2012-07-15 LAB — VITAMIN D 25 HYDROXY (VIT D DEFICIENCY, FRACTURES): Vit D, 25-Hydroxy: 42 ng/mL (ref 30–89)

## 2012-07-28 ENCOUNTER — Ambulatory Visit (INDEPENDENT_AMBULATORY_CARE_PROVIDER_SITE_OTHER): Payer: 59 | Admitting: Family Medicine

## 2012-07-28 ENCOUNTER — Encounter: Payer: Self-pay | Admitting: Family Medicine

## 2012-07-28 VITALS — BP 150/82 | HR 74 | Resp 16 | Ht 67.5 in | Wt 214.8 lb

## 2012-07-28 DIAGNOSIS — E785 Hyperlipidemia, unspecified: Secondary | ICD-10-CM

## 2012-07-28 DIAGNOSIS — J309 Allergic rhinitis, unspecified: Secondary | ICD-10-CM

## 2012-07-28 DIAGNOSIS — E119 Type 2 diabetes mellitus without complications: Secondary | ICD-10-CM

## 2012-07-28 DIAGNOSIS — J302 Other seasonal allergic rhinitis: Secondary | ICD-10-CM

## 2012-07-28 DIAGNOSIS — R5383 Other fatigue: Secondary | ICD-10-CM

## 2012-07-28 DIAGNOSIS — E669 Obesity, unspecified: Secondary | ICD-10-CM

## 2012-07-28 DIAGNOSIS — I10 Essential (primary) hypertension: Secondary | ICD-10-CM

## 2012-07-28 DIAGNOSIS — F329 Major depressive disorder, single episode, unspecified: Secondary | ICD-10-CM

## 2012-07-28 MED ORDER — ROSUVASTATIN CALCIUM 10 MG PO TABS: 10.0000 mg | ORAL_TABLET | Freq: Every day | ORAL | Status: DC

## 2012-07-28 MED ORDER — FLUTICASONE PROPIONATE 50 MCG/ACT NA SUSP: 1.0000 | Freq: Every day | NASAL | Status: DC

## 2012-07-28 MED ORDER — LANSOPRAZOLE 30 MG PO CPDR: DELAYED_RELEASE_CAPSULE | ORAL | Status: DC

## 2012-07-28 MED ORDER — SITAGLIPTIN PHOSPHATE 100 MG PO TABS: ORAL_TABLET | ORAL | Status: DC

## 2012-07-28 NOTE — Patient Instructions (Addendum)
F/u in January  It is important that you exercise regularly at least 30 minutes 5 times a week. If you develop chest pain, have severe difficulty breathing, or feel very tired, stop exercising immediately and seek medical attention   A healthy diet is rich in fruit, vegetables and whole grains. Poultry fish, nuts and beans are a healthy choice for protein rather then red meat. A low sodium diet and drinking 64 ounces of water daily is generally recommended. Oils and sweet should be limited. Carbohydrates especially for those who are diabetic or overweight, should be limited to 30-45 gram per meal. It is important to eat on a regular schedule, at least 3 times daily. Snacks should be primarily fruits, vegetables or nuts.  Weight loss goal of 4 to 5 pounds  Pease stop snuff  You are referred for eye exam  Hba1c non fasting , cbc and chem 7 in January

## 2012-08-17 NOTE — Assessment & Plan Note (Signed)
Controlled, no change in medication Patient educated about the importance of limiting  Carbohydrate intake , the need to commit to daily physical activity for a minimum of 30 minutes , and to commit weight loss. The fact that changes in all these areas will reduce or eliminate all together the development of diabetes is stressed.   Patient advised to reduce carb and sweets, commit to regular physical activity, take meds as prescribed, test blood as directed, and attempt to lose weight, to improve blood sugar control.  

## 2012-08-17 NOTE — Assessment & Plan Note (Signed)
Controlled, no change in medication  

## 2012-08-17 NOTE — Progress Notes (Signed)
  Subjective:    Patient ID: Sophia [name], female    DOB: July 20, 1951, 61 y.o.   MRN: [id number]  HPI The PT is here for follow up and re-evaluation of chronic medical conditions, medication management and review of any available recent lab and radiology data.  Preventive health is updated, specifically  Cancer screening and Immunization.   Questions or concerns regarding consultations or procedures which the PT has had in the interim are  addressed. The PT denies any adverse reactions to current medications since the last visit.  There are no new concerns.  There are no specific complaints       Review of Systems See HPI Denies recent fever or chills. Denies sinus pressure, nasal congestion, ear pain or sore throat. Denies chest congestion, productive cough or wheezing. Denies chest pains, palpitations and leg swelling Denies abdominal pain, nausea, vomiting,diarrhea or constipation.   Denies dysuria, frequency, hesitancy or incontinence. Denies uncontrolled  joint pain, swelling and limitation in mobility. Denies headaches, seizures, numbness, or tingling. Denies depression, anxiety or insomnia. Denies skin break down or rash.        Objective:   Physical Exam  Patient alert and oriented and in no cardiopulmonary distress.  HEENT: No facial asymmetry, EOMI, no sinus tenderness,  oropharynx pink and moist.  Neck supple no adenopathy.  Chest: Clear to auscultation bilaterally.  CVS: S1, S2 no murmurs, no S3.  ABD: Soft non tender. Bowel sounds normal.  Ext: No edema  MS: Adequate ROM spine, shoulders, hips and knees.  Skin: Intact, no ulcerations or rash noted.  Psych: Good eye contact, normal affect. Memory intact not anxious or depressed appearing.  CNS: CN 2-12 intact, power, tone and sensation normal throughout. Diabetic Foot Check:  Appearance - no lesions, ulcers or calluses Skin - no unusual pallor or redness Sensation - grossly intact to light  touch Monofilament testing -  Right - Great toe, medial, central, lateral ball and posterior foot intact Left - Great toe, medial, central, lateral ball and posterior foot intact Pulses Left - Dorsalis Pedis and Posterior Tibia normal Right - Dorsalis Pedis and Posterior Tibia normal       Assessment & Plan:

## 2012-08-17 NOTE — Assessment & Plan Note (Signed)
Elevated at this visit, no med change at this time since generally controled. DASH diet and commitment to daily physical activity for a minimum of 30 minutes discussed and encouraged, as a part of hypertension management. The importance of attaining a healthy weight is also discussed.

## 2012-09-11 ENCOUNTER — Other Ambulatory Visit: Payer: Self-pay | Admitting: Family Medicine

## 2012-12-09 ENCOUNTER — Other Ambulatory Visit: Payer: Self-pay | Admitting: Family Medicine

## 2012-12-10 ENCOUNTER — Ambulatory Visit (INDEPENDENT_AMBULATORY_CARE_PROVIDER_SITE_OTHER): Payer: 59 | Admitting: Family Medicine

## 2012-12-10 ENCOUNTER — Encounter: Payer: Self-pay | Admitting: Family Medicine

## 2012-12-10 VITALS — BP 140/80 | HR 91 | Resp 16 | Ht 67.5 in | Wt 214.0 lb

## 2012-12-10 DIAGNOSIS — J302 Other seasonal allergic rhinitis: Secondary | ICD-10-CM

## 2012-12-10 DIAGNOSIS — I10 Essential (primary) hypertension: Secondary | ICD-10-CM

## 2012-12-10 DIAGNOSIS — E785 Hyperlipidemia, unspecified: Secondary | ICD-10-CM

## 2012-12-10 DIAGNOSIS — F329 Major depressive disorder, single episode, unspecified: Secondary | ICD-10-CM

## 2012-12-10 DIAGNOSIS — E669 Obesity, unspecified: Secondary | ICD-10-CM

## 2012-12-10 DIAGNOSIS — F3289 Other specified depressive episodes: Secondary | ICD-10-CM

## 2012-12-10 DIAGNOSIS — E119 Type 2 diabetes mellitus without complications: Secondary | ICD-10-CM

## 2012-12-10 DIAGNOSIS — J309 Allergic rhinitis, unspecified: Secondary | ICD-10-CM

## 2012-12-10 LAB — HEMOGLOBIN A1C: Hgb A1c MFr Bld: 6.6 % — ABNORMAL HIGH (ref <5.7)

## 2012-12-10 LAB — CBC WITH DIFFERENTIAL/PLATELET
Basophils Absolute: 0 10*3/uL (ref 0.0–0.1)
Basophils Relative: 0 % (ref 0–1)
Eosinophils Absolute: 0.2 10*3/uL (ref 0.0–0.7)
MCH: 25.6 pg — ABNORMAL LOW (ref 26.0–34.0)
MCHC: 33.1 g/dL (ref 30.0–36.0)
Neutrophils Relative %: 42 % — ABNORMAL LOW (ref 43–77)
Platelets: 233 10*3/uL (ref 150–400)
RBC: 4.77 MIL/uL (ref 3.87–5.11)

## 2012-12-10 LAB — BASIC METABOLIC PANEL
Calcium: 9.6 mg/dL (ref 8.4–10.5)
Sodium: 142 mEq/L (ref 135–145)

## 2012-12-10 MED ORDER — FLUOXETINE HCL 20 MG PO CAPS: 20.0000 mg | ORAL_CAPSULE | Freq: Every day | ORAL | Status: DC

## 2012-12-10 MED ORDER — FLUTICASONE PROPIONATE 50 MCG/ACT NA SUSP: 1.0000 | Freq: Every day | NASAL | Status: DC

## 2012-12-10 MED ORDER — SITAGLIPTIN PHOSPHATE 100 MG PO TABS: ORAL_TABLET | ORAL | Status: DC

## 2012-12-10 MED ORDER — ROSUVASTATIN CALCIUM 10 MG PO TABS: 10.0000 mg | ORAL_TABLET | Freq: Every day | ORAL | Status: DC

## 2012-12-10 NOTE — Progress Notes (Signed)
  Subjective:    Patient ID: Sophia [name], female    DOB: 03/24/1951, 62 y.o.   MRN: [id number]  HPI The PT is here for follow up and re-evaluation of chronic medical conditions, medication management and review of any available recent lab and radiology data.  Preventive health is updated, specifically  Cancer screening and Immunization.   Questions or concerns regarding consultations or procedures which the PT has had in the interim are  addressed. The PT denies any adverse reactions to current medications since the last visit.  There are no new concerns.  There are no specific complaints       Review of Systems See HPI Denies recent fever or chills. Denies sinus pressure, nasal congestion, ear pain or sore throat. Denies chest congestion, productive cough or wheezing. Denies chest pains, palpitations and leg swelling Denies abdominal pain, nausea, vomiting,diarrhea or constipation.   Denies dysuria, frequency, hesitancy or incontinence. Denies joint pain, swelling and limitation in mobility. Denies headaches, seizures, numbness, or tingling. Denies depression, anxiety or insomnia. Denies skin break down or rash.        Objective:   Physical Exam Patient alert and oriented and in no cardiopulmonary distress.  HEENT: No facial asymmetry, EOMI, no sinus tenderness,  oropharynx pink and moist.  Neck supple no adenopathy.  Chest: Clear to auscultation bilaterally.  CVS: S1, S2 no murmurs, no S3.  ABD: Soft non tender. Bowel sounds normal.  Ext: No edema  MS: Adequate ROM spine, shoulders, hips and knees.  Skin: Intact, no ulcerations or rash noted.  Psych: Good eye contact, normal affect. Memory intact not anxious or depressed appearing.  CNS: CN 2-12 intact, power, tone and sensation normal throughout.        Assessment & Plan:

## 2012-12-10 NOTE — Patient Instructions (Addendum)
F/u in 4.5 month, please call if you need me before  You need to stop dipping snuff!  Please work on regular physical activity for health, outside of the job, also healthier eating to improve health and promiote weight loss.  [facility] and make them aware you are in the wellness program for discounts on medication  Blood sugar slightly higher , no med change  No change in dose of depression med, not needed at this time  Microalb today  Fasting lipid, cmp and EGFr, hBa1C in 4.5 month

## 2012-12-14 ENCOUNTER — Other Ambulatory Visit: Payer: Self-pay | Admitting: Family Medicine

## 2012-12-26 ENCOUNTER — Encounter: Payer: Self-pay | Admitting: Family Medicine

## 2012-12-26 NOTE — Assessment & Plan Note (Signed)
Controlled, no change in medication Patient advised to reduce carb and sweets, commit to regular physical activity, take meds as prescribed, test blood as directed, and attempt to lose weight, to improve blood sugar control.  

## 2012-12-26 NOTE — Assessment & Plan Note (Signed)
Updated lab needed Hyperlipidemia:Low fat diet discussed and encouraged.   

## 2012-12-26 NOTE — Assessment & Plan Note (Signed)
Controlled, no change in medication  

## 2012-12-26 NOTE — Assessment & Plan Note (Signed)
Unchanged. Patient re-educated about  the importance of commitment to a  minimum of 150 minutes of exercise per week. The importance of healthy food choices with portion control discussed. Encouraged to start a food diary, count calories and to consider  joining a support group. Sample diet sheets offered. Goals set by the patient for the next several months.    

## 2012-12-26 NOTE — Assessment & Plan Note (Addendum)
Not at goal, no change in medication DASH diet and commitment to daily physical activity for a minimum of 30 minutes discussed and encouraged, as a part of hypertension management. The importance of attaining a healthy weight is also discussed.  

## 2013-03-11 ENCOUNTER — Other Ambulatory Visit: Payer: Self-pay | Admitting: Family Medicine

## 2013-03-11 DIAGNOSIS — Z139 Encounter for screening, unspecified: Secondary | ICD-10-CM

## 2013-03-12 ENCOUNTER — Ambulatory Visit (HOSPITAL_COMMUNITY)
Admission: RE | Admit: 2013-03-12 | Discharge: 2013-03-12 | Disposition: A | Discharge status: Home / Self Care | Payer: 59 | Source: Ambulatory Visit | Attending: Family Medicine | Admitting: Family Medicine

## 2013-03-12 DIAGNOSIS — Z139 Encounter for screening, unspecified: Secondary | ICD-10-CM

## 2013-03-12 DIAGNOSIS — Z1231 Encounter for screening mammogram for malignant neoplasm of breast: Secondary | ICD-10-CM | POA: Insufficient documentation

## 2013-04-06 ENCOUNTER — Other Ambulatory Visit: Payer: Self-pay | Admitting: Family Medicine

## 2013-04-14 ENCOUNTER — Telehealth: Payer: Self-pay

## 2013-04-14 MED ORDER — GABAPENTIN 100 MG PO CAPS: 100.0000 mg | ORAL_CAPSULE | Freq: Every day | ORAL | Status: DC

## 2013-04-14 NOTE — Telephone Encounter (Signed)
Left msg at work for patient to return call.

## 2013-04-14 NOTE — Telephone Encounter (Signed)
Spoke with patient and she would like to try trial of gabapentin.  Med sent to [facility] for 30 day trial 100mg  qhs

## 2013-04-14 NOTE — Telephone Encounter (Signed)
Explain xrays show fractures and fractrures do not cause burning. I suggest trial of gabapentin 100mg  one at bedtime, if she agrees pls send in 30 day supply only

## 2013-04-14 NOTE — Addendum Note (Signed)
Addended by: Kandis Fantasia B on: 04/14/2013 01:51 PM   Modules accepted: Orders

## 2013-04-26 LAB — COMPLETE METABOLIC PANEL WITH GFR
AST: 15 U/L (ref 0–37)
Albumin: 4.3 g/dL (ref 3.5–5.2)
Alkaline Phosphatase: 52 U/L (ref 39–117)
Potassium: 3.7 mEq/L (ref 3.5–5.3)
Sodium: 142 mEq/L (ref 135–145)
Total Protein: 6.8 g/dL (ref 6.0–8.3)

## 2013-04-26 LAB — LIPID PANEL
Cholesterol: 116 mg/dL (ref 0–200)
HDL: 49 mg/dL (ref >39)
Total CHOL/HDL Ratio: 2.4 Ratio
VLDL: 19 mg/dL (ref 0–40)

## 2013-04-26 LAB — HEMOGLOBIN A1C: Mean Plasma Glucose: 143 mg/dL — ABNORMAL HIGH (ref <117)

## 2013-04-29 ENCOUNTER — Ambulatory Visit (INDEPENDENT_AMBULATORY_CARE_PROVIDER_SITE_OTHER): Payer: 59 | Admitting: Family Medicine

## 2013-04-29 ENCOUNTER — Encounter: Payer: Self-pay | Admitting: Family Medicine

## 2013-04-29 VITALS — BP 140/80 | HR 78 | Resp 18 | Ht 67.5 in | Wt 215.0 lb

## 2013-04-29 DIAGNOSIS — F329 Major depressive disorder, single episode, unspecified: Secondary | ICD-10-CM

## 2013-04-29 DIAGNOSIS — E119 Type 2 diabetes mellitus without complications: Secondary | ICD-10-CM

## 2013-04-29 DIAGNOSIS — I10 Essential (primary) hypertension: Secondary | ICD-10-CM

## 2013-04-29 DIAGNOSIS — M79609 Pain in unspecified limb: Secondary | ICD-10-CM

## 2013-04-29 DIAGNOSIS — E669 Obesity, unspecified: Secondary | ICD-10-CM

## 2013-04-29 DIAGNOSIS — F3289 Other specified depressive episodes: Secondary | ICD-10-CM

## 2013-04-29 DIAGNOSIS — E785 Hyperlipidemia, unspecified: Secondary | ICD-10-CM

## 2013-04-29 DIAGNOSIS — M79671 Pain in right foot: Secondary | ICD-10-CM | POA: Insufficient documentation

## 2013-04-29 MED ORDER — PREDNISONE (PAK) 5 MG PO TABS: 5.0000 mg | ORAL_TABLET | ORAL | Status: DC

## 2013-04-29 NOTE — Progress Notes (Signed)
  Subjective:    Patient ID: Sophia [name], female    DOB: 11-Jun-1951, 62 y.o.   MRN: [id number]  HPI The PT is here for follow up and re-evaluation of chronic medical conditions, medication management and review of any available recent lab and radiology data.  Preventive health is updated, specifically  Cancer screening and Immunization.   Questions or concerns regarding consultations or procedures which the PT has had in the interim are  addressed. The PT denies any adverse reactions to current medications since the last visit.  Uncontrolled right  heel pain x approx 3  weeks, worse with first steps, no inciting trauma, wants help!limiting ability to exercise and weight bear as she would like to      Review of Systems See HPI Denies recent fever or chills. Denies sinus pressure, nasal congestion, ear pain or sore throat. Denies chest congestion, productive cough or wheezing. Denies chest pains, palpitations and leg swelling Denies abdominal pain, nausea, vomiting,diarrhea or constipation.   Denies dysuria, frequency, hesitancy or incontinence.  Denies headaches, seizures, numbness, or tingling. Denies depression, anxiety or insomnia. Denies skin break down or rash.        Objective:   Physical Exam Patient alert and oriented and in no cardiopulmonary distress.  HEENT: No facial asymmetry, EOMI, no sinus tenderness,  oropharynx pink and moist.  Neck supple no adenopathy.  Chest: Clear to auscultation bilaterally.  CVS: S1, S2 no murmurs, no S3.  ABD: Soft non tender. Bowel sounds normal.  Ext: No edema  MS: Adequate ROM spine, shoulders, hips and knees.Tebder to palpation over right  heel  Skin: Intact, no ulcerations or rash noted.  Psych: Good eye contact, normal affect. Memory intact not anxious or depressed appearing.  CNS: CN 2-12 intact, power, tone and sensation normal throughout.        Assessment & Plan:

## 2013-04-29 NOTE — Patient Instructions (Addendum)
F/u 5 month, call if you need me before  Toradol 60mg  Im and depo medrol 80 mg IM  Today for right heel pain and prednisone is sent in for  6 days total, start today  Call by next Wednesday if pain is still severe enough to see podiatry , you will be referred   HBa1C, fasting lipid , cmp and EGFR, TSH  In 5 month  It is important that you exercise regularly at least 30 minutes 5 times a week. If you develop chest pain, have severe difficulty breathing, or feel very tired, stop exercising immediately and seek medical attention

## 2013-04-30 MED ORDER — KETOROLAC TROMETHAMINE 60 MG/2ML IJ SOLN
60.0000 mg | Freq: Once | INTRAMUSCULAR | Status: AC
  Administered 2013-04-29: 60 mg via INTRAMUSCULAR

## 2013-04-30 MED ORDER — METHYLPREDNISOLONE ACETATE 80 MG/ML IJ SUSP
80.0000 mg | Freq: Once | INTRAMUSCULAR | Status: AC
  Administered 2013-04-30: 80 mg via INTRAMUSCULAR

## 2013-05-01 NOTE — Assessment & Plan Note (Signed)
Controlled, no change in medication Patient advised to reduce carb and sweets, commit to regular physical activity, take meds as prescribed, test blood as directed, and attempt to lose weight, to improve blood sugar control.  

## 2013-05-01 NOTE — Assessment & Plan Note (Addendum)
Sub optimal Control, no change in medication DASH diet and commitment to daily physical activity for a minimum of 30 minutes discussed and encouraged, as a part of hypertension management. The importance of attaining a healthy weight is also discussed.

## 2013-05-01 NOTE — Assessment & Plan Note (Signed)
Unchanged. Patient re-educated about  the importance of commitment to a  minimum of 150 minutes of exercise per week. The importance of healthy food choices with portion control discussed. Encouraged to start a food diary, count calories and to consider  joining a support group. Sample diet sheets offered. Goals set by the patient for the next several months.    

## 2013-05-01 NOTE — Assessment & Plan Note (Signed)
Controlled, no change in medication Hyperlipidemia:Low fat diet discussed and encouraged.  \ 

## 2013-05-01 NOTE — Assessment & Plan Note (Signed)
Controlled, no change in medication  

## 2013-05-01 NOTE — Assessment & Plan Note (Signed)
Uncontrolled, aggressive anti inflammatory course, if no improvement, podiatry eval

## 2013-05-04 ENCOUNTER — Other Ambulatory Visit: Payer: Self-pay

## 2013-05-04 MED ORDER — FLUOXETINE HCL 20 MG PO CAPS: 20.0000 mg | ORAL_CAPSULE | Freq: Every day | ORAL | Status: DC

## 2013-05-04 MED ORDER — SITAGLIPTIN PHOSPHATE 100 MG PO TABS: ORAL_TABLET | ORAL | Status: DC

## 2013-05-19 ENCOUNTER — Telehealth: Payer: Self-pay | Admitting: Family Medicine

## 2013-05-19 ENCOUNTER — Other Ambulatory Visit: Payer: Self-pay | Admitting: Family Medicine

## 2013-05-19 DIAGNOSIS — G8929 Other chronic pain: Secondary | ICD-10-CM

## 2013-05-24 NOTE — Telephone Encounter (Signed)
Patient is aware 

## 2013-07-08 ENCOUNTER — Telehealth: Payer: Self-pay | Admitting: Family Medicine

## 2013-07-08 ENCOUNTER — Other Ambulatory Visit: Payer: Self-pay | Admitting: Family Medicine

## 2013-07-08 MED ORDER — PRAVASTATIN SODIUM 40 MG PO TABS: 40.0000 mg | ORAL_TABLET | Freq: Every evening | ORAL | Status: DC

## 2013-07-08 NOTE — Telephone Encounter (Signed)
If she uses [facility], not sure why it's that much  However , I will send pravachol 40mg  to [facility], tried to talk with her, no answer, I am sending in med today

## 2013-07-09 NOTE — Telephone Encounter (Signed)
noted 

## 2013-07-21 ENCOUNTER — Other Ambulatory Visit: Payer: Self-pay

## 2013-07-21 MED ORDER — PRAVASTATIN SODIUM 40 MG PO TABS: 40.0000 mg | ORAL_TABLET | Freq: Every evening | ORAL | Status: DC

## 2013-09-15 ENCOUNTER — Telehealth: Payer: Self-pay

## 2013-09-15 MED ORDER — PREDNISONE 5 MG PO TABS: 5.0000 mg | ORAL_TABLET | Freq: Two times a day (BID) | ORAL | Status: DC

## 2013-09-15 MED ORDER — IBUPROFEN 800 MG PO TABS: 800.0000 mg | ORAL_TABLET | Freq: Three times a day (TID) | ORAL | Status: DC | PRN

## 2013-09-15 NOTE — Addendum Note (Signed)
Addended by: Kandis Fantasia B on: 09/15/2013 05:03 PM   Modules accepted: Orders

## 2013-09-15 NOTE — Telephone Encounter (Signed)
Patient aware and states that she already has appointment for the 1st week in November that she will keep.  Will try medicine and see if that helps.  Meds sent to requested pharmacy.

## 2013-09-15 NOTE — Telephone Encounter (Signed)
pls send in ibuprofen 800mg  3 times daily for 1 week and prednisone 5mg  twice daily for 5 days to pharmacy of her choice, explain I believe this will help, and sched an appt for hr next week

## 2013-09-20 ENCOUNTER — Telehealth: Payer: Self-pay

## 2013-09-20 ENCOUNTER — Other Ambulatory Visit: Payer: Self-pay | Admitting: Family Medicine

## 2013-09-20 NOTE — Telephone Encounter (Signed)
meds refilled 

## 2013-10-01 ENCOUNTER — Other Ambulatory Visit: Payer: Self-pay | Admitting: Family Medicine

## 2013-10-01 LAB — COMPLETE METABOLIC PANEL WITH GFR
AST: 18 U/L (ref 0–37)
Albumin: 4.4 g/dL (ref 3.5–5.2)
BUN: 18 mg/dL (ref 6–23)
Calcium: 9.8 mg/dL (ref 8.4–10.5)
Chloride: 102 mEq/L (ref 96–112)
Creat: 0.69 mg/dL (ref 0.50–1.10)
GFR, Est Non African American: >89 mL/min
Glucose, Bld: 110 mg/dL — ABNORMAL HIGH (ref 70–99)
Potassium: 3.9 mEq/L (ref 3.5–5.3)
Sodium: 143 mEq/L (ref 135–145)
Total Protein: 7.3 g/dL (ref 6.0–8.3)

## 2013-10-01 LAB — LIPID PANEL
Cholesterol: 162 mg/dL (ref 0–200)
HDL: 56 mg/dL (ref >39)
LDL Cholesterol: 88 mg/dL (ref 0–99)
Total CHOL/HDL Ratio: 2.9 Ratio
Triglycerides: 88 mg/dL (ref <150)
VLDL: 18 mg/dL (ref 0–40)

## 2013-10-01 LAB — HEMOGLOBIN A1C
Hgb A1c MFr Bld: 6.2 % — ABNORMAL HIGH (ref <5.7)
Mean Plasma Glucose: 131 mg/dL — ABNORMAL HIGH (ref <117)

## 2013-10-01 LAB — TSH: TSH: 1.866 u[IU]/mL (ref 0.350–4.500)

## 2013-10-05 ENCOUNTER — Encounter (INDEPENDENT_AMBULATORY_CARE_PROVIDER_SITE_OTHER): Payer: Self-pay

## 2013-10-05 ENCOUNTER — Encounter: Payer: Self-pay | Admitting: Family Medicine

## 2013-10-05 ENCOUNTER — Ambulatory Visit (INDEPENDENT_AMBULATORY_CARE_PROVIDER_SITE_OTHER): Payer: 59 | Admitting: Family Medicine

## 2013-10-05 VITALS — BP 148/82 | HR 90 | Resp 18 | Ht 67.5 in | Wt 219.0 lb

## 2013-10-05 DIAGNOSIS — E119 Type 2 diabetes mellitus without complications: Secondary | ICD-10-CM

## 2013-10-05 DIAGNOSIS — M549 Dorsalgia, unspecified: Secondary | ICD-10-CM

## 2013-10-05 DIAGNOSIS — M79609 Pain in unspecified limb: Secondary | ICD-10-CM

## 2013-10-05 DIAGNOSIS — I10 Essential (primary) hypertension: Secondary | ICD-10-CM

## 2013-10-05 DIAGNOSIS — E669 Obesity, unspecified: Secondary | ICD-10-CM

## 2013-10-05 DIAGNOSIS — F3289 Other specified depressive episodes: Secondary | ICD-10-CM

## 2013-10-05 DIAGNOSIS — F329 Major depressive disorder, single episode, unspecified: Secondary | ICD-10-CM

## 2013-10-05 DIAGNOSIS — M79671 Pain in right foot: Secondary | ICD-10-CM

## 2013-10-05 DIAGNOSIS — E785 Hyperlipidemia, unspecified: Secondary | ICD-10-CM

## 2013-10-05 NOTE — Patient Instructions (Addendum)
CPE and pap in 4.5 month, call if you ned me before  PLEASE check on last tetanus vaccine, you MAY need one  Congrats on improved blood sugar   Please continue to reduce snuff you need to quit  Microalb, HBA1C chem 7 and EGFR cbc and microalb, for next visit, non fasting pleasse   You are referred for an mRI of your low back

## 2013-10-05 NOTE — Progress Notes (Signed)
  Subjective:    Patient ID: Sophia [name], female    DOB: 09-12-1951, 62 y.o.   MRN: [id number]  HPI The PT is here for follow up and re-evaluation of chronic medical conditions, medication management and review of any available recent lab and radiology data.  Preventive health is updated, specifically  Cancer screening and Immunization.   Questions or concerns regarding consultations or procedures which the PT has had in the interim are  addressed. The PT denies any adverse reactions to current medications since the last visit.  C/o worsening and uncontrolled back pain with lower extremity numbness also left heel pain which she requests medication for     Review of Systems See HPI Denies recent fever or chills. Denies sinus pressure, nasal congestion, ear pain or sore throat. Denies chest congestion, productive cough or wheezing. Denies chest pains, palpitations and leg swelling Denies abdominal pain, nausea, vomiting,diarrhea or constipation.   Denies dysuria, frequency, hesitancy or incontinence.  Denies headaches, seizures, numbness, or tingling. Denies uncontrolled  depression, anxiety or insomnia. Denies skin break down or rash.        Objective:   Physical Exam Patient alert and oriented and in no cardiopulmonary distress.  HEENT: No facial asymmetry, EOMI, no sinus tenderness,  oropharynx pink and moist.  Neck supple no adenopathy.  Chest: Clear to auscultation bilaterally.  CVS: S1, S2 no murmurs, no S3.  ABD: Soft non tender. Bowel sounds normal.  Ext: No edema  MS: Adequate ROM spine, shoulders, hips and knees.  Skin: Intact, no ulcerations or rash noted.  Psych: Good eye contact, normal affect. Memory intact not anxious or depressed appearing.  CNS: CN 2-12 intact, power, tone and sensation normal throughout.        Assessment & Plan:

## 2013-10-07 ENCOUNTER — Ambulatory Visit (HOSPITAL_COMMUNITY)
Admission: RE | Admit: 2013-10-07 | Discharge: 2013-10-07 | Disposition: A | Discharge status: Home / Self Care | Payer: 59 | Source: Ambulatory Visit | Attending: Family Medicine | Admitting: Family Medicine

## 2013-10-07 DIAGNOSIS — M545 Low back pain, unspecified: Secondary | ICD-10-CM | POA: Insufficient documentation

## 2013-10-07 DIAGNOSIS — M5137 Other intervertebral disc degeneration, lumbosacral region: Secondary | ICD-10-CM | POA: Insufficient documentation

## 2013-10-07 DIAGNOSIS — M5126 Other intervertebral disc displacement, lumbar region: Secondary | ICD-10-CM | POA: Insufficient documentation

## 2013-10-07 DIAGNOSIS — M48061 Spinal stenosis, lumbar region without neurogenic claudication: Secondary | ICD-10-CM | POA: Insufficient documentation

## 2013-10-07 DIAGNOSIS — M51379 Other intervertebral disc degeneration, lumbosacral region without mention of lumbar back pain or lower extremity pain: Secondary | ICD-10-CM | POA: Insufficient documentation

## 2013-10-07 DIAGNOSIS — M47817 Spondylosis without myelopathy or radiculopathy, lumbosacral region: Secondary | ICD-10-CM | POA: Insufficient documentation

## 2013-10-07 DIAGNOSIS — M549 Dorsalgia, unspecified: Secondary | ICD-10-CM

## 2013-10-19 ENCOUNTER — Other Ambulatory Visit: Payer: Self-pay | Admitting: Family Medicine

## 2013-11-07 NOTE — Assessment & Plan Note (Signed)
unchnaged Patient re-educated about  the importance of commitment to a  minimum of 150 minutes of exercise per week. The importance of healthy food choices with portion control discussed. Encouraged to start a food diary, count calories and to consider  joining a support group. Sample diet sheets offered. Goals set by the patient for the next several months.    

## 2013-11-07 NOTE — Assessment & Plan Note (Signed)
worseniing LBP with symptoms of nerve damage, needs MRI

## 2013-11-07 NOTE — Assessment & Plan Note (Signed)
Not at goal, however based on age will hope that with lifestyle change only , pt will have improved numbers.No change in medication DASH diet and commitment to daily physical activity for a minimum of 30 minutes discussed and encouraged, as a part of hypertension management. The importance of attaining a healthy weight is also discussed.

## 2013-11-07 NOTE — Assessment & Plan Note (Signed)
Ongoing pain , voltaren tab prescribed

## 2013-11-07 NOTE — Assessment & Plan Note (Signed)
Controlled, no change in medication  

## 2013-11-07 NOTE — Assessment & Plan Note (Signed)
Controlled, no change in medication Hyperlipidemia:Low fat diet discussed and encouraged.  \ 

## 2013-11-07 NOTE — Assessment & Plan Note (Signed)
Excellent, improved. Patient advised to reduce carb and sweets, commit to regular physical activity, take meds as prescribed, test blood as directed, and attempt to lose weight, to improve blood sugar control.

## 2013-12-14 ENCOUNTER — Other Ambulatory Visit: Payer: Self-pay | Admitting: Family Medicine

## 2013-12-31 ENCOUNTER — Encounter (HOSPITAL_COMMUNITY): Payer: Self-pay | Admitting: Pharmacy Technician

## 2014-01-04 ENCOUNTER — Encounter (HOSPITAL_COMMUNITY): Payer: Self-pay | Admitting: Pharmacy Technician

## 2014-01-05 ENCOUNTER — Encounter (HOSPITAL_COMMUNITY)
Admission: RE | Admit: 2014-01-05 | Discharge: 2014-01-05 | Disposition: A | Discharge status: Home / Self Care | Payer: 59 | Source: Ambulatory Visit | Attending: Ophthalmology | Admitting: Ophthalmology

## 2014-01-05 ENCOUNTER — Other Ambulatory Visit: Payer: Self-pay

## 2014-01-05 ENCOUNTER — Encounter (HOSPITAL_COMMUNITY): Payer: Self-pay

## 2014-01-05 DIAGNOSIS — Z01812 Encounter for preprocedural laboratory examination: Secondary | ICD-10-CM | POA: Insufficient documentation

## 2014-01-05 DIAGNOSIS — Z01810 Encounter for preprocedural cardiovascular examination: Secondary | ICD-10-CM | POA: Insufficient documentation

## 2014-01-05 HISTORY — DX: Essential (primary) hypertension: I10

## 2014-01-05 HISTORY — DX: Pure hypercholesterolemia, unspecified: E78.00

## 2014-01-05 HISTORY — DX: Nausea with vomiting, unspecified: R11.2

## 2014-01-05 HISTORY — DX: Type 2 diabetes mellitus without complications: E11.9

## 2014-01-05 HISTORY — DX: Depression, unspecified: F32.A

## 2014-01-05 HISTORY — DX: Unspecified osteoarthritis, unspecified site: M19.90

## 2014-01-05 HISTORY — DX: Other specified postprocedural states: Z98.890

## 2014-01-05 HISTORY — DX: Gastro-esophageal reflux disease without esophagitis: K21.9

## 2014-01-05 HISTORY — DX: Anxiety disorder, unspecified: F41.9

## 2014-01-05 HISTORY — DX: Major depressive disorder, single episode, unspecified: F32.9

## 2014-01-05 LAB — BASIC METABOLIC PANEL
BUN: 17 mg/dL (ref 6–23)
CALCIUM: 9.8 mg/dL (ref 8.4–10.5)
CHLORIDE: 100 meq/L (ref 96–112)
CO2: 30 meq/L (ref 19–32)
CREATININE: 0.71 mg/dL (ref 0.50–1.10)
GFR calc Af Amer: >90 mL/min (ref >90)
GFR calc non Af Amer: 90 mL/min — ABNORMAL LOW (ref >90)
Glucose, Bld: 107 mg/dL — ABNORMAL HIGH (ref 70–99)
Potassium: 4.1 mEq/L (ref 3.7–5.3)
Sodium: 142 mEq/L (ref 137–147)

## 2014-01-05 LAB — HEMOGLOBIN AND HEMATOCRIT, BLOOD
HCT: 40 % (ref 36.0–46.0)
Hemoglobin: 12.8 g/dL (ref 12.0–15.0)

## 2014-01-05 NOTE — Patient Instructions (Signed)
Your procedure is scheduled on: 01/11/2014  Report to [facility] at  700  AM.  Call this number if you have problems the morning of surgery: [phone]   Do not eat food or drink liquids :After Midnight.      Take these medicines the morning of surgery with A SIP OF WATER: prevacid, lotensin, cardiazem, prozac   Do not wear jewelry, make-up or nail polish.  Do not wear lotions, powders, or perfumes.  Do not shave 48 hours prior to surgery.  Do not bring valuables to the hospital.  Contacts, dentures or bridgework may not be worn into surgery.  Leave suitcase in the car. After surgery it may be brought to your room.  For patients admitted to the hospital, checkout time is 11:00 AM the day of discharge.   Patients discharged the day of surgery will not be allowed to drive home.  :     Please read over the following fact sheets that you were given: Coughing and Deep Breathing, Surgical Site Infection Prevention, Anesthesia Post-op Instructions and Care and Recovery After Surgery    Cataract A cataract is a clouding of the lens of the eye. When a lens becomes cloudy, vision is reduced based on the degree and nature of the clouding. Many cataracts reduce vision to some degree. Some cataracts make people more near-sighted as they develop. Other cataracts increase glare. Cataracts that are ignored and become worse can sometimes look white. The white color can be seen through the pupil. CAUSES   Aging. However, cataracts may occur at any age, even in newborns.   Certain drugs.   Trauma to the eye.   Certain diseases such as diabetes.   Specific eye diseases such as chronic inflammation inside the eye or a sudden attack of a rare form of glaucoma.   Inherited or acquired medical problems.  SYMPTOMS   Gradual, progressive drop in vision in the affected eye.   Severe, rapid visual loss. This most often happens when trauma is the cause.  DIAGNOSIS  To detect a cataract, an eye doctor  examines the lens. Cataracts are best diagnosed with an exam of the eyes with the pupils enlarged (dilated) by drops.  TREATMENT  For an early cataract, vision may improve by using different eyeglasses or stronger lighting. If that does not help your vision, surgery is the only effective treatment. A cataract needs to be surgically removed when vision loss interferes with your everyday activities, such as driving, reading, or watching TV. A cataract may also have to be removed if it prevents examination or treatment of another eye problem. Surgery removes the cloudy lens and usually replaces it with a substitute lens (intraocular lens, IOL).  At a time when both you and your doctor agree, the cataract will be surgically removed. If you have cataracts in both eyes, only one is usually removed at a time. This allows the operated eye to heal and be out of danger from any possible problems after surgery (such as infection or poor wound healing). In rare cases, a cataract may be doing damage to your eye. In these cases, your caregiver may advise surgical removal right away. The vast majority of people who have cataract surgery have better vision afterward. HOME CARE INSTRUCTIONS  If you are not planning surgery, you may be asked to do the following:  Use different eyeglasses.   Use stronger or brighter lighting.   Ask your eye doctor about reducing your medicine dose or changing  medicines if it is thought that a medicine caused your cataract. Changing medicines does not make the cataract go away on its own.   Become familiar with your surroundings. Poor vision can lead to injury. Avoid bumping into things on the affected side. You are at a higher risk for tripping or falling.   Exercise extreme care when driving or operating machinery.   Wear sunglasses if you are sensitive to bright light or experiencing problems with glare.  SEEK IMMEDIATE MEDICAL CARE IF:   You have a worsening or sudden vision  loss.   You notice redness, swelling, or increasing pain in the eye.   You have a fever.  Document Released: 11/04/2005 Document Revised: 10/24/2011 Document Reviewed: 06/28/2011 [facility] Patient Information 2012 [place].PATIENT INSTRUCTIONS POST-ANESTHESIA  IMMEDIATELY FOLLOWING SURGERY:  Do not drive or operate machinery for the first twenty four hours after surgery.  Do not make any important decisions for twenty four hours after surgery or while taking narcotic pain medications or sedatives.  If you develop intractable nausea and vomiting or a severe headache please notify your doctor immediately.  FOLLOW-UP:  Please make an appointment with your surgeon as instructed. You do not need to follow up with anesthesia unless specifically instructed to do so.  WOUND CARE INSTRUCTIONS (if applicable):  Keep a dry clean dressing on the anesthesia/puncture wound site if there is drainage.  Once the wound has quit draining you may leave it open to air.  Generally you should leave the bandage intact for twenty four hours unless there is drainage.  If the epidural site drains for more than 36-48 hours please call the anesthesia department.  QUESTIONS?:  Please feel free to call your physician or the hospital operator if you have any questions, and they will be happy to assist you.

## 2014-01-10 MED ORDER — TETRACAINE HCL 0.5 % OP SOLN
OPHTHALMIC | Status: AC
  Filled 2014-01-10: qty 2

## 2014-01-10 MED ORDER — KETOROLAC TROMETHAMINE 0.5 % OP SOLN
OPHTHALMIC | Status: AC
  Filled 2014-01-10: qty 5

## 2014-01-10 MED ORDER — CYCLOPENTOLATE-PHENYLEPHRINE OP SOLN OPTIME - NO CHARGE
OPHTHALMIC | Status: AC
Start: 2014-01-10 — End: 2014-01-10
  Filled 2014-01-10: qty 2

## 2014-01-10 MED ORDER — PHENYLEPHRINE HCL 2.5 % OP SOLN
OPHTHALMIC | Status: AC
  Filled 2014-01-10: qty 15

## 2014-01-11 ENCOUNTER — Encounter (HOSPITAL_COMMUNITY)
Admission: RE | Disposition: A | Discharge status: Home / Self Care | Payer: Self-pay | Source: Ambulatory Visit | Attending: Ophthalmology

## 2014-01-11 ENCOUNTER — Ambulatory Visit (HOSPITAL_COMMUNITY): Payer: 59 | Admitting: Anesthesiology

## 2014-01-11 ENCOUNTER — Encounter (HOSPITAL_COMMUNITY): Payer: Self-pay | Admitting: *Deleted

## 2014-01-11 ENCOUNTER — Encounter (HOSPITAL_COMMUNITY): Payer: 59 | Admitting: Anesthesiology

## 2014-01-11 ENCOUNTER — Ambulatory Visit (HOSPITAL_COMMUNITY)
Admission: RE | Admit: 2014-01-11 | Discharge: 2014-01-11 | Disposition: A | Discharge status: Home / Self Care | Payer: 59 | Source: Ambulatory Visit | Attending: Ophthalmology | Admitting: Ophthalmology

## 2014-01-11 DIAGNOSIS — E119 Type 2 diabetes mellitus without complications: Secondary | ICD-10-CM | POA: Insufficient documentation

## 2014-01-11 DIAGNOSIS — F411 Generalized anxiety disorder: Secondary | ICD-10-CM | POA: Insufficient documentation

## 2014-01-11 DIAGNOSIS — Z87891 Personal history of nicotine dependence: Secondary | ICD-10-CM | POA: Insufficient documentation

## 2014-01-11 DIAGNOSIS — Z79899 Other long term (current) drug therapy: Secondary | ICD-10-CM | POA: Insufficient documentation

## 2014-01-11 DIAGNOSIS — H2510 Age-related nuclear cataract, unspecified eye: Secondary | ICD-10-CM | POA: Insufficient documentation

## 2014-01-11 DIAGNOSIS — I10 Essential (primary) hypertension: Secondary | ICD-10-CM | POA: Insufficient documentation

## 2014-01-11 HISTORY — PX: CATARACT EXTRACTION W/PHACO: SHX586

## 2014-01-11 LAB — GLUCOSE, CAPILLARY: Glucose-Capillary: 116 mg/dL — ABNORMAL HIGH (ref 70–99)

## 2014-01-11 SURGERY — PHACOEMULSIFICATION, CATARACT, WITH IOL INSERTION
Anesthesia: Monitor Anesthesia Care | Laterality: Right

## 2014-01-11 MED ORDER — BSS IO SOLN
INTRAOCULAR | Status: DC | PRN
  Administered 2014-01-11: 15 mL via INTRAOCULAR

## 2014-01-11 MED ORDER — CYCLOPENTOLATE-PHENYLEPHRINE 0.2-1 % OP SOLN
1.0000 [drp] | OPHTHALMIC | Status: AC
  Administered 2014-01-11 (×3): 1 [drp] via OPHTHALMIC

## 2014-01-11 MED ORDER — LACTATED RINGERS IV SOLN
INTRAVENOUS | Status: DC
  Administered 2014-01-11: 1000 mL via INTRAVENOUS

## 2014-01-11 MED ORDER — MIDAZOLAM HCL 5 MG/5ML IJ SOLN
INTRAMUSCULAR | Status: DC | PRN
  Administered 2014-01-11: 2 mg via INTRAVENOUS

## 2014-01-11 MED ORDER — ONDANSETRON HCL 4 MG/2ML IJ SOLN: 4.0000 mg | Freq: Once | INTRAMUSCULAR | Status: DC | PRN

## 2014-01-11 MED ORDER — KETOROLAC TROMETHAMINE 0.5 % OP SOLN
1.0000 [drp] | OPHTHALMIC | Status: AC
  Administered 2014-01-11 (×3): 1 [drp] via OPHTHALMIC

## 2014-01-11 MED ORDER — PROVISC 10 MG/ML IO SOLN
INTRAOCULAR | Status: DC | PRN
  Administered 2014-01-11: 0.85 mL via INTRAOCULAR

## 2014-01-11 MED ORDER — FENTANYL CITRATE 0.05 MG/ML IJ SOLN
INTRAMUSCULAR | Status: AC
  Filled 2014-01-11: qty 2

## 2014-01-11 MED ORDER — LIDOCAINE HCL (PF) 1 % IJ SOLN
INTRAMUSCULAR | Status: AC
  Filled 2014-01-11: qty 2

## 2014-01-11 MED ORDER — TETRACAINE HCL 0.5 % OP SOLN
1.0000 [drp] | OPHTHALMIC | Status: AC
  Administered 2014-01-11 (×3): 1 [drp] via OPHTHALMIC

## 2014-01-11 MED ORDER — LIDOCAINE HCL (PF) 1 % IJ SOLN
INTRAMUSCULAR | Status: DC | PRN
  Administered 2014-01-11: 1 mL

## 2014-01-11 MED ORDER — TETRACAINE 0.5 % OP SOLN OPTIME - NO CHARGE
OPHTHALMIC | Status: DC | PRN
  Administered 2014-01-11: 2 [drp] via OPHTHALMIC

## 2014-01-11 MED ORDER — PHENYLEPHRINE HCL 2.5 % OP SOLN
1.0000 [drp] | OPHTHALMIC | Status: AC
  Administered 2014-01-11 (×3): 1 [drp] via OPHTHALMIC

## 2014-01-11 MED ORDER — FENTANYL CITRATE 0.05 MG/ML IJ SOLN: 25.0000 ug | INTRAMUSCULAR | Status: DC | PRN

## 2014-01-11 MED ORDER — MIDAZOLAM HCL 2 MG/2ML IJ SOLN
INTRAMUSCULAR | Status: AC
  Filled 2014-01-11: qty 2

## 2014-01-11 MED ORDER — MIDAZOLAM HCL 2 MG/2ML IJ SOLN
1.0000 mg | INTRAMUSCULAR | Status: DC | PRN
  Administered 2014-01-11: 2 mg via INTRAVENOUS

## 2014-01-11 MED ORDER — FENTANYL CITRATE 0.05 MG/ML IJ SOLN
25.0000 ug | INTRAMUSCULAR | Status: AC
Start: 2014-01-11 — End: 2014-01-11
  Administered 2014-01-11 (×2): 25 ug via INTRAVENOUS

## 2014-01-11 MED ORDER — EPINEPHRINE HCL 1 MG/ML IJ SOLN
INTRAOCULAR | Status: DC | PRN
  Administered 2014-01-11: 09:00:00

## 2014-01-11 SURGICAL SUPPLY — 26 items
CAPSULAR TENSION RING-AMO (OPHTHALMIC RELATED) IMPLANT
CLOTH BEACON ORANGE TIMEOUT ST (SAFETY) ×2 IMPLANT
EYE SHIELD UNIVERSAL CLEAR (GAUZE/BANDAGES/DRESSINGS) ×2 IMPLANT
GLOVE BIO SURGEON STRL SZ 6.5 (GLOVE) IMPLANT
GLOVE BIO SURGEONS STRL SZ 6.5 (GLOVE)
GLOVE BIOGEL PI IND STRL 6.5 (GLOVE) IMPLANT
GLOVE BIOGEL PI INDICATOR 6.5 (GLOVE) ×4
GLOVE ECLIPSE 6.5 STRL STRAW (GLOVE) IMPLANT
GLOVE ECLIPSE 7.0 STRL STRAW (GLOVE) IMPLANT
GLOVE EXAM NITRILE LRG STRL (GLOVE) IMPLANT
GLOVE EXAM NITRILE MD LF STRL (GLOVE) IMPLANT
GLOVE SKINSENSE NS SZ6.5 (GLOVE)
GLOVE SKINSENSE STRL SZ6.5 (GLOVE) IMPLANT
HEALON 5 0.6 ML (INTRAOCULAR LENS) IMPLANT
KIT VITRECTOMY (OPHTHALMIC RELATED) IMPLANT
PAD ARMBOARD 7.5X6 YLW CONV (MISCELLANEOUS) ×2 IMPLANT
PROC W NO LENS (INTRAOCULAR LENS)
PROC W SPEC LENS (INTRAOCULAR LENS)
PROCESS W NO LENS (INTRAOCULAR LENS) IMPLANT
PROCESS W SPEC LENS (INTRAOCULAR LENS) IMPLANT
RING MALYGIN (MISCELLANEOUS) IMPLANT
SIGHTPATH CAT PROC W REG LENS (Ophthalmic Related) ×3 IMPLANT
SYR TB 1ML LL NO SAFETY (SYRINGE) ×2 IMPLANT
TAPE CLOTH SOFT 2X10 (GAUZE/BANDAGES/DRESSINGS) ×2 IMPLANT
VISCOELASTIC ADDITIONAL (OPHTHALMIC RELATED) IMPLANT
WATER STERILE IRR 250ML POUR (IV SOLUTION) ×2 IMPLANT

## 2014-01-11 NOTE — Discharge Instructions (Signed)
Sophia [name]  01/11/2014           [facility] Instructions [place] [address]      1. Avoid closing eyes tightly. One often closes the eye tightly when laughing, talking, sneezing, coughing or if they feel irritated. At these times, you should be careful not to close your eyes tightly.  2. Instill eye drops as instructed. To instill drops in your eye, open it, look up and have someone gently pull the lower lid down and instill a couple of drops inside the lower lid.  3. Do not touch upper lid.  4. Take Advil or Tylenol for pain.  5. You may use either eye for near work, such as reading or sewing and you may watch television.  6. You may have your hair done at the beauty parlor at any time.  7. Wear dark glasses with or without your own glasses if you are in bright light.  8. Call our office at [phone] or [phone] if you have sharp pain in your eye or unusual symptoms.  9. Do not be concerned because vision in the operative eye is not good. It will not be good, no matter how successful the operation, until you get a special lens for it. Your old glasses will not be suited to the new eye that was operated on and you will not be ready for a new lens for about a month.  10. Follow up at the [facility] office.    I have received a copy of the above instructions and will follow them.      Follow up with Dr. Gershon Crane today at 1 PM

## 2014-01-11 NOTE — Op Note (Signed)
Patient brought to the operating room and prepped and draped in the usual manner.  Lid speculum inserted in right eye.  Stab incision made at the twelve o'clock position.  Provisc instilled in the anterior chamber.   A 2.4 mm. Stab incision was made temporally.  An anterior capsulotomy was done with a bent 25 gauge needle.  The nucleus was hydrodissected.  The Phaco tip was inserted in the anterior chamber and the nucleus was emulsified.  CDE was 5.57.  The cortical material was then removed with the I and A tip.  Posterior capsule was the polished.  The anterior chamber was deepened with Provisc.  A 21.5 Diopter Rayner 570C IOL was then inserted in the capsular bag.  Provisc was then removed with the I and A tip.  The wound was then hydrated.  Patient sent to the Recovery Room in good condition with follow up in my office.  Preoperative Diagnosis:  Nuclear Cataract OD Postoperative Diagnosis:  Same Procedure name: Kelman Phacoemulsification OD with IOL

## 2014-01-11 NOTE — Transfer of Care (Signed)
Immediate Anesthesia Transfer of Care Note  Patient: Sophia [name]  Procedure(s) Performed: Procedure(s) with comments: CATARACT EXTRACTION PHACO AND INTRAOCULAR LENS PLACEMENT (IOC) (Right) - CDE 5.57  Patient Location: Short Stay  Anesthesia Type:MAC  Level of Consciousness: awake, alert , oriented and patient cooperative  Airway & Oxygen Therapy: Patient Spontanous Breathing  Post-op Assessment: Report given to PACU RN, Post -op Vital signs reviewed and stable and Patient moving all extremities  Post vital signs: Reviewed and stable  Complications: No apparent anesthesia complications

## 2014-01-11 NOTE — Anesthesia Postprocedure Evaluation (Signed)
  Anesthesia Post-op Note  Patient: Sophia [name]  Procedure(s) Performed: Procedure(s) with comments: CATARACT EXTRACTION PHACO AND INTRAOCULAR LENS PLACEMENT (IOC) (Right) - CDE 5.57  Patient Location: Short Stay  Anesthesia Type:MAC  Level of Consciousness: awake, alert , oriented and patient cooperative  Airway and Oxygen Therapy: Patient Spontanous Breathing  Post-op Pain: none  Post-op Assessment: Post-op Vital signs reviewed, Patient's Cardiovascular Status Stable, Respiratory Function Stable, Patent Airway and Pain level controlled  Post-op Vital Signs: Reviewed and stable  Complications: No apparent anesthesia complications

## 2014-01-11 NOTE — H&P (Signed)
The patient was re examined and there is no change in the patients condition since the original H and P. 

## 2014-01-11 NOTE — Anesthesia Preprocedure Evaluation (Signed)
Anesthesia Evaluation  Patient identified by MRN, date of birth, ID band Patient awake    Reviewed: Allergy & Precautions, H&P , NPO status , Patient's Chart, lab work & pertinent test results  History of Anesthesia Complications (+) PONV and history of anesthetic complications  Airway Mallampati: III TM Distance: >3 FB     Dental  (+) Edentulous Upper, Edentulous Lower   Pulmonary former smoker,  breath sounds clear to auscultation        Cardiovascular hypertension, Pt. on medications Rhythm:Regular Rate:Normal     Neuro/Psych PSYCHIATRIC DISORDERS Anxiety Depression    GI/Hepatic GERD-  Medicated,  Endo/Other  diabetes, Well Controlled, Type 2, Oral Hypoglycemic Agents  Renal/GU      Musculoskeletal   Abdominal   Peds  Hematology  (+) anemia ,   Anesthesia Other Findings   Reproductive/Obstetrics                           Anesthesia Physical Anesthesia Plan  ASA: III  Anesthesia Plan: MAC   Post-op Pain Management:    Induction: Intravenous  Airway Management Planned: Nasal Cannula  Additional Equipment:   Intra-op Plan:   Post-operative Plan:   Informed Consent: I have reviewed the patients History and Physical, chart, labs and discussed the procedure including the risks, benefits and alternatives for the proposed anesthesia with the patient or authorized representative who has indicated his/her understanding and acceptance.     Plan Discussed with:   Anesthesia Plan Comments:         Anesthesia Quick Evaluation  

## 2014-01-12 ENCOUNTER — Encounter (HOSPITAL_COMMUNITY): Payer: Self-pay | Admitting: Ophthalmology

## 2014-01-19 ENCOUNTER — Encounter (HOSPITAL_COMMUNITY): Payer: Self-pay

## 2014-01-19 ENCOUNTER — Encounter (HOSPITAL_COMMUNITY): Payer: 59 | Attending: Ophthalmology

## 2014-01-24 MED ORDER — PHENYLEPHRINE HCL 2.5 % OP SOLN
OPHTHALMIC | Status: AC
  Filled 2014-01-24: qty 15

## 2014-01-24 MED ORDER — CYCLOPENTOLATE-PHENYLEPHRINE OP SOLN OPTIME - NO CHARGE
OPHTHALMIC | Status: AC
  Filled 2014-01-24: qty 2

## 2014-01-24 MED ORDER — TETRACAINE HCL 0.5 % OP SOLN
OPHTHALMIC | Status: AC
  Filled 2014-01-24: qty 2

## 2014-01-24 MED ORDER — KETOROLAC TROMETHAMINE 0.5 % OP SOLN
OPHTHALMIC | Status: AC
  Filled 2014-01-24: qty 5

## 2014-01-25 ENCOUNTER — Ambulatory Visit (HOSPITAL_COMMUNITY): Payer: 59 | Admitting: Anesthesiology

## 2014-01-25 ENCOUNTER — Encounter (HOSPITAL_COMMUNITY): Payer: 59 | Admitting: Anesthesiology

## 2014-01-25 ENCOUNTER — Ambulatory Visit (HOSPITAL_COMMUNITY)
Admission: RE | Admit: 2014-01-25 | Discharge: 2014-01-25 | Disposition: A | Discharge status: Home / Self Care | Payer: 59 | Source: Ambulatory Visit | Attending: Ophthalmology | Admitting: Ophthalmology

## 2014-01-25 ENCOUNTER — Encounter (HOSPITAL_COMMUNITY)
Admission: RE | Disposition: A | Discharge status: Home / Self Care | Payer: Self-pay | Source: Ambulatory Visit | Attending: Ophthalmology

## 2014-01-25 DIAGNOSIS — E119 Type 2 diabetes mellitus without complications: Secondary | ICD-10-CM | POA: Insufficient documentation

## 2014-01-25 DIAGNOSIS — Z01812 Encounter for preprocedural laboratory examination: Secondary | ICD-10-CM | POA: Insufficient documentation

## 2014-01-25 DIAGNOSIS — I10 Essential (primary) hypertension: Secondary | ICD-10-CM | POA: Insufficient documentation

## 2014-01-25 DIAGNOSIS — Z79899 Other long term (current) drug therapy: Secondary | ICD-10-CM | POA: Insufficient documentation

## 2014-01-25 DIAGNOSIS — H2510 Age-related nuclear cataract, unspecified eye: Secondary | ICD-10-CM | POA: Insufficient documentation

## 2014-01-25 DIAGNOSIS — Z87891 Personal history of nicotine dependence: Secondary | ICD-10-CM | POA: Insufficient documentation

## 2014-01-25 DIAGNOSIS — F411 Generalized anxiety disorder: Secondary | ICD-10-CM | POA: Insufficient documentation

## 2014-01-25 HISTORY — PX: CATARACT EXTRACTION W/PHACO: SHX586

## 2014-01-25 LAB — GLUCOSE, CAPILLARY: Glucose-Capillary: 103 mg/dL — ABNORMAL HIGH (ref 70–99)

## 2014-01-25 SURGERY — PHACOEMULSIFICATION, CATARACT, WITH IOL INSERTION
Anesthesia: Monitor Anesthesia Care | Site: Eye | Laterality: Left

## 2014-01-25 MED ORDER — MIDAZOLAM HCL 2 MG/2ML IJ SOLN
INTRAMUSCULAR | Status: AC
  Filled 2014-01-25: qty 2

## 2014-01-25 MED ORDER — LIDOCAINE HCL (PF) 1 % IJ SOLN
INTRAMUSCULAR | Status: AC
  Filled 2014-01-25: qty 2

## 2014-01-25 MED ORDER — TETRACAINE HCL 0.5 % OP SOLN
1.0000 [drp] | OPHTHALMIC | Status: AC
  Administered 2014-01-25 (×3): 1 [drp] via OPHTHALMIC

## 2014-01-25 MED ORDER — FENTANYL CITRATE 0.05 MG/ML IJ SOLN
25.0000 ug | INTRAMUSCULAR | Status: AC
  Administered 2014-01-25: 25 ug via INTRAVENOUS

## 2014-01-25 MED ORDER — FENTANYL CITRATE 0.05 MG/ML IJ SOLN
INTRAMUSCULAR | Status: AC
  Filled 2014-01-25: qty 2

## 2014-01-25 MED ORDER — EPINEPHRINE HCL 1 MG/ML IJ SOLN
INTRAOCULAR | Status: DC | PRN
  Administered 2014-01-25: 09:00:00

## 2014-01-25 MED ORDER — BSS IO SOLN
INTRAOCULAR | Status: DC | PRN
  Administered 2014-01-25: 15 mL via INTRAOCULAR

## 2014-01-25 MED ORDER — MIDAZOLAM HCL 2 MG/2ML IJ SOLN
1.0000 mg | INTRAMUSCULAR | Status: DC | PRN
  Administered 2014-01-25: 2 mg via INTRAVENOUS

## 2014-01-25 MED ORDER — PROVISC 10 MG/ML IO SOLN
INTRAOCULAR | Status: DC | PRN
  Administered 2014-01-25: 0.85 mL via INTRAOCULAR

## 2014-01-25 MED ORDER — EPINEPHRINE HCL 1 MG/ML IJ SOLN
INTRAMUSCULAR | Status: AC
  Filled 2014-01-25: qty 1

## 2014-01-25 MED ORDER — KETOROLAC TROMETHAMINE 0.5 % OP SOLN
1.0000 [drp] | OPHTHALMIC | Status: AC
  Administered 2014-01-25 (×3): 1 [drp] via OPHTHALMIC

## 2014-01-25 MED ORDER — TETRACAINE 0.5 % OP SOLN OPTIME - NO CHARGE
OPHTHALMIC | Status: DC | PRN
  Administered 2014-01-25 (×2): 2 [drp] via OPHTHALMIC

## 2014-01-25 MED ORDER — CYCLOPENTOLATE-PHENYLEPHRINE 0.2-1 % OP SOLN
1.0000 [drp] | OPHTHALMIC | Status: AC
  Administered 2014-01-25 (×3): 1 [drp] via OPHTHALMIC

## 2014-01-25 MED ORDER — LIDOCAINE HCL (PF) 1 % IJ SOLN
INTRAMUSCULAR | Status: DC | PRN
  Administered 2014-01-25: .5 mL

## 2014-01-25 MED ORDER — PHENYLEPHRINE HCL 2.5 % OP SOLN
1.0000 [drp] | OPHTHALMIC | Status: AC
  Administered 2014-01-25 (×3): 1 [drp] via OPHTHALMIC

## 2014-01-25 MED ORDER — LACTATED RINGERS IV SOLN
INTRAVENOUS | Status: DC
  Administered 2014-01-25: 09:00:00 via INTRAVENOUS

## 2014-01-25 SURGICAL SUPPLY — 26 items
CAPSULAR TENSION RING-AMO (OPHTHALMIC RELATED) IMPLANT
CLOTH BEACON ORANGE TIMEOUT ST (SAFETY) ×2 IMPLANT
EYE SHIELD UNIVERSAL CLEAR (GAUZE/BANDAGES/DRESSINGS) ×2 IMPLANT
GLOVE BIO SURGEON STRL SZ 6.5 (GLOVE) ×1 IMPLANT
GLOVE BIO SURGEONS STRL SZ 6.5 (GLOVE) ×1
GLOVE BIOGEL PI IND STRL 7.0 (GLOVE) IMPLANT
GLOVE BIOGEL PI INDICATOR 7.0 (GLOVE) ×2
GLOVE ECLIPSE 6.5 STRL STRAW (GLOVE) IMPLANT
GLOVE ECLIPSE 7.0 STRL STRAW (GLOVE) IMPLANT
GLOVE EXAM NITRILE LRG STRL (GLOVE) IMPLANT
GLOVE EXAM NITRILE MD LF STRL (GLOVE) IMPLANT
GLOVE SKINSENSE NS SZ6.5 (GLOVE)
GLOVE SKINSENSE STRL SZ6.5 (GLOVE) IMPLANT
HEALON 5 0.6 ML (INTRAOCULAR LENS) IMPLANT
KIT VITRECTOMY (OPHTHALMIC RELATED) IMPLANT
PAD ARMBOARD 7.5X6 YLW CONV (MISCELLANEOUS) ×2 IMPLANT
PROC W NO LENS (INTRAOCULAR LENS)
PROC W SPEC LENS (INTRAOCULAR LENS)
PROCESS W NO LENS (INTRAOCULAR LENS) IMPLANT
PROCESS W SPEC LENS (INTRAOCULAR LENS) IMPLANT
RING MALYGIN (MISCELLANEOUS) IMPLANT
SIGHTPATH CAT PROC W REG LENS (Ophthalmic Related) ×3 IMPLANT
TAPE SURG TRANSPORE 1 IN (GAUZE/BANDAGES/DRESSINGS) IMPLANT
TAPE SURGICAL TRANSPORE 1 IN (GAUZE/BANDAGES/DRESSINGS) ×2
VISCOELASTIC ADDITIONAL (OPHTHALMIC RELATED) IMPLANT
WATER STERILE IRR 250ML POUR (IV SOLUTION) ×2 IMPLANT

## 2014-01-25 NOTE — H&P (Signed)
The patient was re examined and there is no change in the patients condition since the original H and P. 

## 2014-01-25 NOTE — Anesthesia Procedure Notes (Signed)
Procedure Name: MAC Date/Time: 01/25/2014 9:22 AM Performed by: Vista Deck Pre-anesthesia Checklist: Patient identified, Emergency Drugs available, Suction available, Timeout performed and Patient being monitored Patient Re-evaluated:Patient Re-evaluated prior to inductionOxygen Delivery Method: Nasal Cannula

## 2014-01-25 NOTE — Discharge Instructions (Signed)
Sophia [name]  01/25/2014           [facility] Instructions [place] [address]-      1. Avoid closing eyes tightly. One often closes the eye tightly when laughing, talking, sneezing, coughing or if they feel irritated. At these times, you should be careful not to close your eyes tightly.  2. Instill eye drops as instructed. To instill drops in your eye, open it, look up and have someone gently pull the lower lid down and instill a couple of drops inside the lower lid.  3. Do not touch upper lid.  4. Take Advil or Tylenol for pain.  5. You may use either eye for near work, such as reading or sewing and you may watch television.  6. You may have your hair done at the beauty parlor at any time.  7. Wear dark glasses with or without your own glasses if you are in bright light.  8. Call our office at [phone] or [phone] if you have sharp pain in your eye or unusual symptoms.  9. Do not be concerned because vision in the operative eye is not good. It will not be good, no matter how successful the operation, until you get a special lens for it. Your old glasses will not be suited to the new eye that was operated on and you will not be ready for a new lens for about a month.  10. Follow up at the [facility] office.    I have received a copy of the above instructions and will follow them.   PATIENT INSTRUCTIONS POST-ANESTHESIA  IMMEDIATELY FOLLOWING SURGERY:  Do not drive or operate machinery for the first twenty four hours after surgery.  Do not make any important decisions for twenty four hours after surgery or while taking narcotic pain medications or sedatives.  If you develop intractable nausea and vomiting or a severe headache please notify your doctor immediately.  FOLLOW-UP:  Please make an appointment with your surgeon as instructed. You do not need to follow up with anesthesia unless specifically instructed to do  so.  WOUND CARE INSTRUCTIONS (if applicable):  Keep a dry clean dressing on the anesthesia/puncture wound site if there is drainage.  Once the wound has quit draining you may leave it open to air.  Generally you should leave the bandage intact for twenty four hours unless there is drainage.  If the epidural site drains for more than 36-48 hours please call the anesthesia department.  QUESTIONS?:  Please feel free to call your physician or the hospital operator if you have any questions, and they will be happy to assist you.

## 2014-01-25 NOTE — Anesthesia Postprocedure Evaluation (Signed)
  Anesthesia Post-op Note  Patient: Sophia [name]  Procedure(s) Performed: Procedure(s) (LRB): CATARACT EXTRACTION PHACO AND INTRAOCULAR LENS PLACEMENT (IOC) (Left)  Patient Location:  Short Stay  Anesthesia Type: MAC  Level of Consciousness: awake  Airway and Oxygen Therapy: Patient Spontanous Breathing  Post-op Pain: none  Post-op Assessment: Post-op Vital signs reviewed, Patient's Cardiovascular Status Stable, Respiratory Function Stable, Patent Airway, No signs of Nausea or vomiting and Pain level controlled  Post-op Vital Signs: Reviewed and stable  Complications: No apparent anesthesia complications

## 2014-01-25 NOTE — Anesthesia Preprocedure Evaluation (Signed)
Anesthesia Evaluation  Patient identified by MRN, date of birth, ID band Patient awake    Reviewed: Allergy & Precautions, H&P , NPO status , Patient's Chart, lab work & pertinent test results  History of Anesthesia Complications (+) PONV and history of anesthetic complications  Airway Mallampati: III TM Distance: >3 FB     Dental  (+) Edentulous Upper, Edentulous Lower   Pulmonary former smoker,  breath sounds clear to auscultation        Cardiovascular hypertension, Pt. on medications Rhythm:Regular Rate:Normal     Neuro/Psych PSYCHIATRIC DISORDERS Anxiety Depression    GI/Hepatic GERD-  Medicated,  Endo/Other  diabetes, Well Controlled, Type 2, Oral Hypoglycemic Agents  Renal/GU      Musculoskeletal   Abdominal   Peds  Hematology  (+) anemia ,   Anesthesia Other Findings   Reproductive/Obstetrics                           Anesthesia Physical Anesthesia Plan  ASA: III  Anesthesia Plan: MAC   Post-op Pain Management:    Induction: Intravenous  Airway Management Planned: Nasal Cannula  Additional Equipment:   Intra-op Plan:   Post-operative Plan:   Informed Consent: I have reviewed the patients History and Physical, chart, labs and discussed the procedure including the risks, benefits and alternatives for the proposed anesthesia with the patient or authorized representative who has indicated his/her understanding and acceptance.     Plan Discussed with:   Anesthesia Plan Comments:         Anesthesia Quick Evaluation

## 2014-01-25 NOTE — Anesthesia Preprocedure Evaluation (Deleted)
Anesthesia Evaluation  Patient identified by MRN, date of birth, ID band  History of Anesthesia Complications (+) PONV and history of anesthetic complications  Airway Mallampati: I TM Distance: >3 FB     Dental  (+) Edentulous Upper   Pulmonary former smoker,  breath sounds clear to auscultation        Cardiovascular hypertension, Pt. on medications Rhythm:Regular Rate:Normal     Neuro/Psych PSYCHIATRIC DISORDERS Anxiety Depression    GI/Hepatic GERD-  Controlled,  Endo/Other  diabetes, Well Controlled, Type 2, Oral Hypoglycemic Agents  Renal/GU      Musculoskeletal   Abdominal   Peds  Hematology  (+) anemia ,   Anesthesia Other Findings   Reproductive/Obstetrics                          Anesthesia Physical Anesthesia Plan  ASA: III  Anesthesia Plan: MAC   Post-op Pain Management:    Induction: Intravenous  Airway Management Planned: Nasal Cannula  Additional Equipment:   Intra-op Plan:   Post-operative Plan:   Informed Consent: I have reviewed the patients History and Physical, chart, labs and discussed the procedure including the risks, benefits and alternatives for the proposed anesthesia with the patient or authorized representative who has indicated his/her understanding and acceptance.     Plan Discussed with:   Anesthesia Plan Comments:         Anesthesia Quick Evaluation                                  Anesthesia Evaluation  Patient identified by MRN, date of birth, ID band Patient awake    Reviewed: Allergy & Precautions, H&P , NPO status , Patient's Chart, lab work & pertinent test results  History of Anesthesia Complications (+) PONV and history of anesthetic complications  Airway Mallampati: III TM Distance: >3 FB     Dental  (+) Edentulous Upper, Edentulous Lower   Pulmonary former smoker,  breath sounds clear to auscultation        Cardiovascular hypertension, Pt. on medications Rhythm:Regular Rate:Normal     Neuro/Psych PSYCHIATRIC DISORDERS Anxiety Depression    GI/Hepatic GERD-  Medicated,  Endo/Other  diabetes, Well Controlled, Type 2, Oral Hypoglycemic Agents  Renal/GU      Musculoskeletal   Abdominal   Peds  Hematology  (+) anemia ,   Anesthesia Other Findings   Reproductive/Obstetrics                           Anesthesia Physical Anesthesia Plan  ASA: III  Anesthesia Plan: MAC   Post-op Pain Management:    Induction: Intravenous  Airway Management Planned: Nasal Cannula  Additional Equipment:   Intra-op Plan:   Post-operative Plan:   Informed Consent: I have reviewed the patients History and Physical, chart, labs and discussed the procedure including the risks, benefits and alternatives for the proposed anesthesia with the patient or authorized representative who has indicated his/her understanding and acceptance.     Plan Discussed with:   Anesthesia Plan Comments:         Anesthesia Quick Evaluation

## 2014-01-25 NOTE — Op Note (Signed)
Patient brought to the operating room and prepped and draped in the usual manner.  Lid speculum inserted in left eye.  Stab incision made at the twelve o'clock position.  Provisc instilled in the anterior chamber.   A 2.4 mm. Stab incision was made temporally.  An anterior capsulotomy was done with a bent 25 gauge needle.  The nucleus was hydrodissected.  The Phaco tip was inserted in the anterior chamber and the nucleus was emulsified.  CDE was 5.19.  The cortical material was then removed with the I and A tip.  Posterior capsule was the polished.  The anterior chamber was deepened with Provisc.  A 21.5 Diopter Rayner 570C IOL was then inserted in the capsular bag.  Provisc was then removed with the I and A tip.  The wound was then hydrated.  Patient sent to the Recovery Room in good condition with follow up in my office.  Preoperative Diagnosis:  Nuclear Cataract OS Postoperative Diagnosis:  Same Procedure name: Kelman Phacoemulsification OS with IOL

## 2014-01-25 NOTE — Transfer of Care (Signed)
Immediate Anesthesia Transfer of Care Note  Patient: Sophia [name]  Procedure(s) Performed: Procedure(s) (LRB): CATARACT EXTRACTION PHACO AND INTRAOCULAR LENS PLACEMENT (IOC) (Left)  Patient Location: Shortstay  Anesthesia Type: MAC  Level of Consciousness: awake  Airway & Oxygen Therapy: Patient Spontanous Breathing   Post-op Assessment: Report given to PACU RN, Post -op Vital signs reviewed and stable and Patient moving all extremities  Post vital signs: Reviewed and stable  Complications: No apparent anesthesia complications

## 2014-01-26 ENCOUNTER — Encounter (HOSPITAL_COMMUNITY): Payer: Self-pay | Admitting: Ophthalmology

## 2014-02-16 ENCOUNTER — Other Ambulatory Visit: Payer: Self-pay | Admitting: Family Medicine

## 2014-02-16 LAB — CBC
HCT: 35 % — ABNORMAL LOW (ref 36.0–46.0)
Hemoglobin: 11.4 g/dL — ABNORMAL LOW (ref 12.0–15.0)
MCH: 25.2 pg — ABNORMAL LOW (ref 26.0–34.0)
MCHC: 32.6 g/dL (ref 30.0–36.0)
MCV: 77.4 fL — ABNORMAL LOW (ref 78.0–100.0)
Platelets: 254 10*3/uL (ref 150–400)
RBC: 4.52 MIL/uL (ref 3.87–5.11)
RDW: 16 % — ABNORMAL HIGH (ref 11.5–15.5)
WBC: 6.4 10*3/uL (ref 4.0–10.5)

## 2014-02-17 ENCOUNTER — Other Ambulatory Visit (HOSPITAL_COMMUNITY)
Admission: RE | Admit: 2014-02-17 | Discharge: 2014-02-17 | Disposition: A | Discharge status: Home / Self Care | Payer: 59 | Source: Ambulatory Visit | Attending: Family Medicine | Admitting: Family Medicine

## 2014-02-17 ENCOUNTER — Ambulatory Visit (INDEPENDENT_AMBULATORY_CARE_PROVIDER_SITE_OTHER): Payer: 59 | Admitting: Family Medicine

## 2014-02-17 ENCOUNTER — Encounter: Payer: Self-pay | Admitting: Gastroenterology

## 2014-02-17 ENCOUNTER — Encounter: Payer: Self-pay | Admitting: Family Medicine

## 2014-02-17 VITALS — BP 142/88 | HR 76 | Resp 18 | Ht 67.5 in | Wt 218.0 lb

## 2014-02-17 DIAGNOSIS — Z1211 Encounter for screening for malignant neoplasm of colon: Secondary | ICD-10-CM

## 2014-02-17 DIAGNOSIS — I10 Essential (primary) hypertension: Secondary | ICD-10-CM

## 2014-02-17 DIAGNOSIS — Z1151 Encounter for screening for human papillomavirus (HPV): Secondary | ICD-10-CM | POA: Insufficient documentation

## 2014-02-17 DIAGNOSIS — F329 Major depressive disorder, single episode, unspecified: Secondary | ICD-10-CM

## 2014-02-17 DIAGNOSIS — Z01419 Encounter for gynecological examination (general) (routine) without abnormal findings: Secondary | ICD-10-CM | POA: Insufficient documentation

## 2014-02-17 DIAGNOSIS — Z124 Encounter for screening for malignant neoplasm of cervix: Secondary | ICD-10-CM

## 2014-02-17 DIAGNOSIS — Z8719 Personal history of other diseases of the digestive system: Secondary | ICD-10-CM

## 2014-02-17 DIAGNOSIS — Z0000 Encounter for general adult medical examination without abnormal findings: Secondary | ICD-10-CM

## 2014-02-17 DIAGNOSIS — D649 Anemia, unspecified: Secondary | ICD-10-CM | POA: Insufficient documentation

## 2014-02-17 DIAGNOSIS — F3289 Other specified depressive episodes: Secondary | ICD-10-CM

## 2014-02-17 LAB — COMPLETE METABOLIC PANEL WITH GFR
ALT: 30 U/L (ref 0–35)
AST: 20 U/L (ref 0–37)
Albumin: 4.5 g/dL (ref 3.5–5.2)
Alkaline Phosphatase: 63 U/L (ref 39–117)
BUN: 18 mg/dL (ref 6–23)
CO2: 31 mEq/L (ref 19–32)
Calcium: 9.5 mg/dL (ref 8.4–10.5)
Chloride: 101 mEq/L (ref 96–112)
Creat: 0.64 mg/dL (ref 0.50–1.10)
GFR, Est African American: >89 mL/min
GFR, Est Non African American: >89 mL/min
Glucose, Bld: 76 mg/dL (ref 70–99)
Potassium: 3.8 mEq/L (ref 3.5–5.3)
Sodium: 140 mEq/L (ref 135–145)
Total Bilirubin: 0.2 mg/dL (ref 0.2–1.2)
Total Protein: 7.4 g/dL (ref 6.0–8.3)

## 2014-02-17 LAB — FERRITIN: FERRITIN: 192 ng/mL (ref 10–291)

## 2014-02-17 LAB — POC HEMOCCULT BLD/STL (OFFICE/1-CARD/DIAGNOSTIC): Fecal Occult Blood, POC: NEGATIVE

## 2014-02-17 LAB — HEMOGLOBIN A1C
Hgb A1c MFr Bld: 6.4 % — ABNORMAL HIGH (ref <5.7)
Mean Plasma Glucose: 137 mg/dL — ABNORMAL HIGH (ref <117)

## 2014-02-17 LAB — IRON: IRON: 43 ug/dL (ref 42–145)

## 2014-02-17 LAB — MICROALBUMIN / CREATININE URINE RATIO
Creatinine, Urine: 67.2 mg/dL
Microalb Creat Ratio: 67.3 mg/g — ABNORMAL HIGH (ref 0.0–30.0)
Microalb, Ur: 4.52 mg/dL — ABNORMAL HIGH (ref 0.00–1.89)

## 2014-02-17 MED ORDER — FLUOXETINE HCL 40 MG PO CAPS: 40.0000 mg | ORAL_CAPSULE | Freq: Every day | ORAL | Status: DC

## 2014-02-17 NOTE — Patient Instructions (Signed)
F/u in 2.5 month, call if you need me before  No change in bP med, work on increased vegetable and fruit in your diet , regular activity and weight loss  Increase in fluoxetine dose to 40mg  daily  You are referred to Dr Oneida Alar since you are anemic and report intermittent rectal bleeding for years, on a cyclical basis

## 2014-02-19 DIAGNOSIS — Z0000 Encounter for general adult medical examination without abnormal findings: Secondary | ICD-10-CM | POA: Insufficient documentation

## 2014-02-19 DIAGNOSIS — Z8719 Personal history of other diseases of the digestive system: Secondary | ICD-10-CM | POA: Insufficient documentation

## 2014-02-19 NOTE — Assessment & Plan Note (Signed)
Worsening, and inadequately treated. Dose increase in prozac. Pt not suicidal or homicidal, would benefit from therapy but financially challenged currently and not interested in employee health help

## 2014-02-19 NOTE — Assessment & Plan Note (Signed)
Longstanding h/o cyclical rectal bleeding with  anemia. Referred to GI for further eval

## 2014-02-19 NOTE — Assessment & Plan Note (Signed)
Elevated at this visit, will focus on lifestyle change with no change in medication at this time

## 2014-02-19 NOTE — Progress Notes (Signed)
Subjective:    Patient ID: Sophia [name], female    DOB: 08/06/1951, 63 y.o.   MRN: [id number]  HPI Patient is in for annual exam Health maintainance is reviewed and updated, specifically screening tests and recommended immunizations. Recent lab and radiologic data, since previous visit is also reviewed with the patient. Healthy lifestyle as far as commitment to regular physical activity, heart healthy diet , is discussed. The importance of adequate rest is also discussed. C/o uncontrolled depression, tearful feels as though she needs increased dose of medication , not suicidal or homicidal, overwhelmed, and financial stress is real. C/o cyclical episodes of rectal bleeding for years though she has no womb, recent labs show anemia so needs referral for sooner GI eval  Cutting back on snuff but states beneficial as far as her nerves are concerned       Review of Systems See HPI Denies recent fever or chills. Denies sinus pressure, nasal congestion, ear pain or sore throat. Denies chest congestion, productive cough or wheezing. Denies chest pains, palpitations and leg swelling Denies abdominal pain, nausea, vomiting,diarrhea or constipation.   Denies dysuria, frequency, hesitancy or incontinence. Denies joint pain, swelling and limitation in mobility. Denies headaches, seizures, numbness, or tingling.  Denies skin break down or rash.        Objective:   Physical Exam Pleasant well nourished female, alert and oriented x 3, in no cardio-pulmonary distress. Afebrile. HEENT No facial trauma or asymetry. Sinuses non tender.  EOMI, PERTL External ears normal, tympanic membranes clear. Oropharynx moist, no exudate, dentures Neck: supple, no adenopathy,JVD or thyromegaly.No bruits.  Chest: Clear to ascultation bilaterally.No crackles or wheezes. Non tender to palpation  Breast: No asymetry,no masses. No nipple discharge or inversion. No axillary or supraclavicular  adenopathy  Cardiovascular system; Heart sounds normal,  S1 and  S2 ,no S3.  No murmur, or thrill. Apical beat not displaced Peripheral pulses normal.  Abdomen: Soft, non tender, no organomegaly or masses. No bruits. Bowel sounds normal. No guarding, tenderness or rebound.  Rectal:  No mass. Guaiac negative stool.  GU: External genitalia normal. No lesions. Vaginal canal normal.Physiologic discharge. Uterus absent, no adnexal masses, no adnexal tenderness.  Musculoskeletal exam: Full ROM of spine, hips , shoulders and knees. No deformity ,swelling or crepitus noted. No muscle wasting or atrophy.   Neurologic: Cranial nerves 2 to 12 intact. Power, tone ,sensation and reflexes normal throughout. No disturbance in gait. No tremor.  Skin: Intact, no ulceration, erythema , scaling or rash noted. Pigmentation normal throughout  Psych; Depressed and tearful, not suicidal or homicidal, anxious and overwhelmed. Judgement and concentration normal        Assessment & Plan:  Routine general medical examination at a health care facility Annual exam as documented. Counseling done  re healthy lifestyle involving commitment to 150 minutes exercise per week, heart healthy diet, and attaining healthy weight.The importance of adequate sleep also discussed. Regular seat belt use and safe storage  of firearms if patient has them, is also discussed. Changes in health habits are decided on by the patient with goals and time frames  set for achieving them. Immunization and cancer screening needs are specifically addressed at this visit.   DEPRESSION Worsening, and inadequately treated. Dose increase in prozac. Pt not suicidal or homicidal, would benefit from therapy but financially challenged currently and not interested in employee health help  History of rectal bleeding Longstanding h/o cyclical rectal bleeding with  anemia. Referred to GI for further  eval  HYPERTENSION Elevated at this visit, will focus on lifestyle change with no change in medication at this time

## 2014-02-19 NOTE — Assessment & Plan Note (Signed)
Annual exam as documented. Counseling done  re healthy lifestyle involving commitment to 150 minutes exercise per week, heart healthy diet, and attaining healthy weight.The importance of adequate sleep also discussed. Regular seat belt use and safe storage  of firearms if patient has them, is also discussed. Changes in health habits are decided on by the patient with goals and time frames  set for achieving them. Immunization and cancer screening needs are specifically addressed at this visit.  

## 2014-02-21 ENCOUNTER — Telehealth: Payer: Self-pay

## 2014-02-21 ENCOUNTER — Other Ambulatory Visit: Payer: Self-pay

## 2014-02-21 MED ORDER — DICLOFENAC SODIUM 75 MG PO TBEC: DELAYED_RELEASE_TABLET | ORAL | Status: DC

## 2014-02-21 NOTE — Telephone Encounter (Signed)
I have entered a script for limited and responsible use of diclofenac, pls review with patient before sending in. Inc risk of uncontrolled blood pressure, stomach ulcer and kidney disease with excessive use, so use as directed only for moderate to severe pain, for shortest time needed. Pls also document in the phone note where she is hurting  and duration of pain also

## 2014-02-21 NOTE — Telephone Encounter (Signed)
Patient aware.  States that pain in in left foot and pain started x 3 months ago

## 2014-03-15 ENCOUNTER — Other Ambulatory Visit: Payer: Self-pay | Admitting: Family Medicine

## 2014-03-15 DIAGNOSIS — Z1231 Encounter for screening mammogram for malignant neoplasm of breast: Secondary | ICD-10-CM

## 2014-03-18 ENCOUNTER — Ambulatory Visit (HOSPITAL_COMMUNITY)
Admission: RE | Admit: 2014-03-18 | Discharge: 2014-03-18 | Disposition: A | Discharge status: Home / Self Care | Payer: 59 | Source: Ambulatory Visit | Attending: Family Medicine | Admitting: Family Medicine

## 2014-03-18 DIAGNOSIS — Z1231 Encounter for screening mammogram for malignant neoplasm of breast: Secondary | ICD-10-CM | POA: Insufficient documentation

## 2014-03-22 ENCOUNTER — Other Ambulatory Visit: Payer: Self-pay | Admitting: Family Medicine

## 2014-03-24 ENCOUNTER — Ambulatory Visit (INDEPENDENT_AMBULATORY_CARE_PROVIDER_SITE_OTHER): Payer: 59 | Admitting: Gastroenterology

## 2014-03-24 ENCOUNTER — Encounter: Payer: Self-pay | Admitting: Gastroenterology

## 2014-03-24 ENCOUNTER — Other Ambulatory Visit: Payer: Self-pay | Admitting: Gastroenterology

## 2014-03-24 VITALS — BP 142/81 | HR 74 | Temp 98.3°F | Ht 68.0 in | Wt 213.8 lb

## 2014-03-24 DIAGNOSIS — D649 Anemia, unspecified: Secondary | ICD-10-CM

## 2014-03-24 NOTE — Patient Instructions (Signed)
LOWER ENDOSCOPY WITH POSSIBLE UPPER ENDOSCOPY THE FIRST WEEK IN June.  IF YOU SEE HEAVIER RECTAL BLEEDING, CALL ME TO SCHEDULE ENDOSCOPY SOONER.  FOLLOW UP IN 6 MOS.

## 2014-03-24 NOTE — Assessment & Plan Note (Addendum)
MICROCYTIC(MCV 77, Hb 11.7) ASSOCIATED WITH NSAID USE AND SML VOLUME HEMATOCHEZIA. 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  TCS/POSSIBLE EGD IN THE FIRST Week of June PER PT REQUEST. BENEFITS V. RISKS EXPLAINED TO THE PT. MOVIPREP HOLD JANUVIA ON AM OF TCS/?EGD OPV IN 6 MOS

## 2014-03-24 NOTE — Progress Notes (Signed)
cc'd to pcp 

## 2014-03-24 NOTE — Progress Notes (Signed)
 Subjective:    Patient ID: Sophia [name], female    DOB: 08/04/1951, 63 y.o.   MRN: [id number] [name], MD  HPI Every 4-6 mos has sml amount of rectal bleeding/deep red when she sits on the toilet. MAY LAST 1/2 DAY AND THEN HAS CRAMPS LIKE A PERIOD. SX OFF AN SINCE 2007 BUT NO WORSE THAN THEY WERE. BMs: DAILY, #5 THIS AM, BUT USU. DEPENDS N WHAT SHE EATS. HAD CATARACT SURGERY x2 THIS YEAR. PREVACID CONTROLS HEARTBURN  UNLESS HE MISSES HER DOSE FOR 2 DAYS STRAIGHT.  NO ETOH. ASA DAILY. USES VOLTAREN 2-3 X/WEEK. STOPPED USING IBUPROFEN 2-3 MOS AGO. WEIGHT LOSS: 7 LBS ON PURPOSE SINCE 2013. NO SIGNIFICANT CHANGE IN HER BOWEL MOVEMENTS. HAS RECTAL ITCHING, PAIN, PRESSURE OFF AND ON. CAN SEE BLOOD WHEN SHE STRAINS TO HAVE A BM. PT DENIES FEVER, CHILLS, nausea, vomiting, melena, diarrhea, constipation, problems swallowing, problems with sedation, OR heartburn or indigestion.    Past Medical History  Diagnosis Date  . PONV (postoperative nausea and vomiting)   . Hypercholesteremia   . GERD (gastroesophageal reflux disease)   . Hypertension   . Anxiety   . Depression   . Diabetes mellitus without complication   . Arthritis     Past Surgical History  Procedure Laterality Date  . Breast surgery Left     benign tumor  . Abdominal hysterectomy    . Abdominal surgery      removal of tumors  . Cataract extraction w/phaco Right 01/11/2014      . Cataract extraction w/phaco Left 01/25/2014       No Known Allergies  Current Outpatient Prescriptions  Medication Sig Dispense Refill  . benazepril-hydrochlorthiazide (LOTENSIN HCT) 20-12.5 MG per tablet TAKE 2 TABLETS BY MOUTH ONCE DAILY    . diclofenac (VOLTAREN) 75 MG EC tablet One tablet twice daily , as needed, for pain. Maximum is 8 tablets per week    . diltiazem (CARDIZEM CD) 180 MG 24 hr capsule TAKE 1 CAPSULE BY MOUTH ONCE A DAY    . FLUoxetine (PROZAC) 40 MG capsule Take 1 capsule (40 mg total) by mouth daily.    . fluticasone  (FLONASE) 50 MCG/ACT nasal spray PLACE 1 SPRAY INTO THE NOSE DAILY.    . JANUVIA 100 MG tablet TAKE 1 TABLET BY MOUTH DAILY    . lansoprazole (PREVACID) 30 MG capsule TAKE 1 CAPSULE BY MOUTH DAILY    . Multiple Vitamin (MULTIVITAMIN WITH MINERALS) TABS tablet Take 1 tablet by mouth daily.    . pravastatin (PRAVACHOL) 40 MG tablet TAKE 1 TABLET (40 MG TOTAL) BY MOUTH EVERY EVENING.     Family History  Problem Relation Age of Onset  . Colon polyps Sister 54  . Colon cancer Neg Hx     History  Substance Use Topics  . Smoking status: Former Smoker -- 0.50 packs/day for 20 years    Types: Cigarettes    Quit date: 01/05/1990  . Smokeless tobacco: Current User    Types: Snuff  . Alcohol Use: No    Review of Systems PER HPI OTHERWISE ALL SYSTEMS ARE NEGATIVE.     Objective:   Physical Exam  Vitals reviewed. Constitutional: She is oriented to person, place, and time. She appears well-nourished. No distress.  HENT:  Head: Normocephalic and atraumatic.  Mouth/Throat: No oropharyngeal exudate.  Eyes: No scleral icterus.  Neck: Normal range of motion.  Cardiovascular: Normal rate, regular rhythm and normal heart sounds.   Pulmonary/Chest: Effort normal and   breath sounds normal. No respiratory distress.  Abdominal: Soft. Bowel sounds are normal. She exhibits no distension. There is no tenderness.  Musculoskeletal: She exhibits no edema.  NO FOCAL DEFICITS   Lymphadenopathy:    She has no cervical adenopathy.  Neurological: She is alert and oriented to person, place, and time.  Psychiatric: She has a normal mood and affect.          Assessment & Plan:   

## 2014-03-25 NOTE — Progress Notes (Signed)
Reminder in epic °

## 2014-04-06 ENCOUNTER — Encounter (HOSPITAL_COMMUNITY): Payer: Self-pay | Admitting: Pharmacy Technician

## 2014-04-20 ENCOUNTER — Other Ambulatory Visit: Payer: Self-pay | Admitting: Family Medicine

## 2014-04-22 ENCOUNTER — Ambulatory Visit (HOSPITAL_COMMUNITY)
Admission: RE | Admit: 2014-04-22 | Discharge: 2014-04-22 | Disposition: A | Discharge status: Home / Self Care | Payer: 59 | Source: Ambulatory Visit | Attending: Gastroenterology | Admitting: Gastroenterology

## 2014-04-22 ENCOUNTER — Encounter (HOSPITAL_COMMUNITY): Payer: Self-pay

## 2014-04-22 ENCOUNTER — Ambulatory Visit: Admit: 2014-04-22 | Payer: Self-pay | Admitting: Internal Medicine

## 2014-04-22 ENCOUNTER — Encounter (HOSPITAL_COMMUNITY)
Admission: RE | Disposition: A | Discharge status: Home / Self Care | Payer: Self-pay | Source: Ambulatory Visit | Attending: Gastroenterology

## 2014-04-22 DIAGNOSIS — Z79899 Other long term (current) drug therapy: Secondary | ICD-10-CM | POA: Insufficient documentation

## 2014-04-22 DIAGNOSIS — K648 Other hemorrhoids: Secondary | ICD-10-CM | POA: Insufficient documentation

## 2014-04-22 DIAGNOSIS — E119 Type 2 diabetes mellitus without complications: Secondary | ICD-10-CM | POA: Insufficient documentation

## 2014-04-22 DIAGNOSIS — K449 Diaphragmatic hernia without obstruction or gangrene: Secondary | ICD-10-CM | POA: Insufficient documentation

## 2014-04-22 DIAGNOSIS — D128 Benign neoplasm of rectum: Secondary | ICD-10-CM | POA: Insufficient documentation

## 2014-04-22 DIAGNOSIS — D129 Benign neoplasm of anus and anal canal: Principal | ICD-10-CM

## 2014-04-22 DIAGNOSIS — K219 Gastro-esophageal reflux disease without esophagitis: Secondary | ICD-10-CM | POA: Insufficient documentation

## 2014-04-22 DIAGNOSIS — K2960 Other gastritis without bleeding: Secondary | ICD-10-CM | POA: Insufficient documentation

## 2014-04-22 DIAGNOSIS — I10 Essential (primary) hypertension: Secondary | ICD-10-CM | POA: Insufficient documentation

## 2014-04-22 DIAGNOSIS — E7800 Pure hypercholesterolemia, unspecified: Secondary | ICD-10-CM | POA: Insufficient documentation

## 2014-04-22 DIAGNOSIS — Q438 Other specified congenital malformations of intestine: Secondary | ICD-10-CM | POA: Insufficient documentation

## 2014-04-22 DIAGNOSIS — D649 Anemia, unspecified: Secondary | ICD-10-CM

## 2014-04-22 DIAGNOSIS — D509 Iron deficiency anemia, unspecified: Secondary | ICD-10-CM | POA: Insufficient documentation

## 2014-04-22 DIAGNOSIS — Z87891 Personal history of nicotine dependence: Secondary | ICD-10-CM | POA: Insufficient documentation

## 2014-04-22 DIAGNOSIS — K5730 Diverticulosis of large intestine without perforation or abscess without bleeding: Secondary | ICD-10-CM | POA: Insufficient documentation

## 2014-04-22 HISTORY — PX: COLONOSCOPY: SHX5424

## 2014-04-22 HISTORY — PX: ESOPHAGOGASTRODUODENOSCOPY: SHX5428

## 2014-04-22 SURGERY — COLONOSCOPY
Anesthesia: Moderate Sedation

## 2014-04-22 MED ORDER — LANSOPRAZOLE 30 MG PO CPDR: 30.0000 mg | DELAYED_RELEASE_CAPSULE | Freq: Two times a day (BID) | ORAL | Status: DC

## 2014-04-22 MED ORDER — SODIUM CHLORIDE 0.9 % IV SOLN
INTRAVENOUS | Status: DC
  Administered 2014-04-22: 08:00:00 via INTRAVENOUS

## 2014-04-22 MED ORDER — BUTAMBEN-TETRACAINE-BENZOCAINE 2-2-14 % EX AERO
INHALATION_SPRAY | CUTANEOUS | Status: AC
  Filled 2014-04-22: qty 56

## 2014-04-22 MED ORDER — MIDAZOLAM HCL 5 MG/5ML IJ SOLN
INTRAMUSCULAR | Status: AC
  Filled 2014-04-22: qty 10

## 2014-04-22 MED ORDER — LIDOCAINE VISCOUS 2 % MT SOLN
OROMUCOSAL | Status: AC
  Filled 2014-04-22: qty 15

## 2014-04-22 MED ORDER — MEPERIDINE HCL 100 MG/ML IJ SOLN
INTRAMUSCULAR | Status: DC | PRN
  Administered 2014-04-22: 50 mg via INTRAVENOUS
  Administered 2014-04-22 (×2): 25 mg via INTRAVENOUS

## 2014-04-22 MED ORDER — MIDAZOLAM HCL 5 MG/5ML IJ SOLN
INTRAMUSCULAR | Status: DC | PRN
  Administered 2014-04-22: 1 mg via INTRAVENOUS
  Administered 2014-04-22: 2 mg via INTRAVENOUS
  Administered 2014-04-22: 1 mg via INTRAVENOUS

## 2014-04-22 MED ORDER — MEPERIDINE HCL 100 MG/ML IJ SOLN
INTRAMUSCULAR | Status: AC
  Filled 2014-04-22: qty 2

## 2014-04-22 MED ORDER — STERILE WATER FOR IRRIGATION IR SOLN
Status: DC | PRN
  Administered 2014-04-22: 08:00:00

## 2014-04-22 MED ORDER — LIDOCAINE VISCOUS 2 % MT SOLN
OROMUCOSAL | Status: DC | PRN
  Administered 2014-04-22: 2 mL via OROMUCOSAL

## 2014-04-22 NOTE — H&P (View-Only) (Signed)
Subjective:    Patient ID: Sophia [name], female    DOB: 01/03/1951, 63 y.o.   MRN: [id number] Tula Nakayama, MD  HPI Every 4-6 mos has sml amount of rectal bleeding/deep red when she sits on the toilet. MAY LAST 1/2 DAY AND THEN HAS CRAMPS LIKE A PERIOD. SX OFF AN SINCE 2007 BUT NO WORSE THAN THEY WERE. BMs: DAILY, #5 THIS AM, BUT USU. DEPENDS N WHAT SHE EATS. HAD CATARACT SURGERY x2 THIS YEAR. PREVACID CONTROLS HEARTBURN  UNLESS HE MISSES HER DOSE FOR 2 DAYS STRAIGHT.  NO ETOH. ASA DAILY. USES VOLTAREN 2-3 X/WEEK. STOPPED USING IBUPROFEN 2-3 MOS AGO. WEIGHT LOSS: 7 LBS ON PURPOSE SINCE 2013. NO SIGNIFICANT CHANGE IN HER BOWEL MOVEMENTS. HAS RECTAL ITCHING, PAIN, PRESSURE OFF AND ON. CAN SEE BLOOD WHEN SHE STRAINS TO HAVE A BM. PT DENIES FEVER, CHILLS, nausea, vomiting, melena, diarrhea, constipation, problems swallowing, problems with sedation, OR heartburn or indigestion.    Past Medical History  Diagnosis Date  . PONV (postoperative nausea and vomiting)   . Hypercholesteremia   . GERD (gastroesophageal reflux disease)   . Hypertension   . Anxiety   . Depression   . Diabetes mellitus without complication   . Arthritis     Past Surgical History  Procedure Laterality Date  . Breast surgery Left     benign tumor  . Abdominal hysterectomy    . Abdominal surgery      removal of tumors  . Cataract extraction w/phaco Right 01/11/2014      . Cataract extraction w/phaco Left 01/25/2014       No Known Allergies  Current Outpatient Prescriptions  Medication Sig Dispense Refill  . benazepril-hydrochlorthiazide (LOTENSIN HCT) 20-12.5 MG per tablet TAKE 2 TABLETS BY MOUTH ONCE DAILY    . diclofenac (VOLTAREN) 75 MG EC tablet One tablet twice daily , as needed, for pain. Maximum is 8 tablets per week    . diltiazem (CARDIZEM CD) 180 MG 24 hr capsule TAKE 1 CAPSULE BY MOUTH ONCE A DAY    . FLUoxetine (PROZAC) 40 MG capsule Take 1 capsule (40 mg total) by mouth daily.    . fluticasone  (FLONASE) 50 MCG/ACT nasal spray PLACE 1 SPRAY INTO THE NOSE DAILY.    Marland Kitchen JANUVIA 100 MG tablet TAKE 1 TABLET BY MOUTH DAILY    . lansoprazole (PREVACID) 30 MG capsule TAKE 1 CAPSULE BY MOUTH DAILY    . Multiple Vitamin (MULTIVITAMIN WITH MINERALS) TABS tablet Take 1 tablet by mouth daily.    . pravastatin (PRAVACHOL) 40 MG tablet TAKE 1 TABLET (40 MG TOTAL) BY MOUTH EVERY EVENING.     Family History  Problem Relation Age of Onset  . Colon polyps Sister 86  . Colon cancer Neg Hx     History  Substance Use Topics  . Smoking status: Former Smoker -- 0.50 packs/day for 20 years    Types: Cigarettes    Quit date: 01/05/1990  . Smokeless tobacco: Current User    Types: Snuff  . Alcohol Use: No    Review of Systems PER HPI OTHERWISE ALL SYSTEMS ARE NEGATIVE.     Objective:   Physical Exam  Vitals reviewed. Constitutional: She is oriented to person, place, and time. She appears well-nourished. No distress.  HENT:  Head: Normocephalic and atraumatic.  Mouth/Throat: No oropharyngeal exudate.  Eyes: No scleral icterus.  Neck: Normal range of motion.  Cardiovascular: Normal rate, regular rhythm and normal heart sounds.   Pulmonary/Chest: Effort normal and  breath sounds normal. No respiratory distress.  Abdominal: Soft. Bowel sounds are normal. She exhibits no distension. There is no tenderness.  Musculoskeletal: She exhibits no edema.  NO FOCAL DEFICITS   Lymphadenopathy:    She has no cervical adenopathy.  Neurological: She is alert and oriented to person, place, and time.  Psychiatric: She has a normal mood and affect.          Assessment & Plan:

## 2014-04-22 NOTE — Op Note (Signed)
[facility] [address] [place], [address]   COLONOSCOPY PROCEDURE REPORT  PATIENT: Sophia [name], Sophia [name]  MR#: [id number] BIRTHDATE: 02/14/1951 , 14  yrs. old GENDER: Female ENDOSCOPIST: Barney Drain, MD REFERRED [id number] Moshe Cipro, M.D. PROCEDURE DATE:  04/22/2014 PROCEDURE:   Colonoscopy with cold biopsy polypectomy INDICATIONS:Anemia, non-specific. MEDICATIONS: Versed 3 mg IV and Demerol 75 mg IV  DESCRIPTION OF PROCEDURE:    Physical exam was performed.  Informed consent was obtained from the patient after explaining the benefits, risks, and alternatives to procedure.  The patient was connected to monitor and placed in left lateral position. Continuous oxygen was provided by nasal cannula and IV medicine administered through an indwelling cannula.  After administration of sedation and rectal exam, the patients rectum was intubated and the EG-2990i ([id number]) and EC-3890Li ([id number])  colonoscope was advanced under direct visualization to the ileum.  The scope was removed slowly by carefully examining the color, texture, anatomy, and integrity mucosa on the way out.  The patient was recovered in endoscopy and discharged home in satisfactory condition.    COLON FINDINGS: The mucosa appeared normal in the terminal ileum.  , Two sessile polyps measuring 2-3 mm in size were found in the rectum.  A polypectomy was performed with cold forceps.  , Mild diverticulosis was noted in the ascending colon and transverse colon.  , The colon was redundant.  Manual abdominal counter-pressure was used to reach the cecum, and Small internal hemorrhoids were found.  PREP QUALITY: good.CECAL W/D TIME: 21 minutes     COMPLICATIONS: None  ENDOSCOPIC IMPRESSION: 1.   Normal mucosa in the terminal ileum 2.   Two COLON polyps REMOVED 3.   Mild diverticulosis in the ascending colon and transverse colon  4.   The LEFT colon IS redundant 5.   Small internal hemorrhoids 6.    NO  SOURCE FOR ANEMIA IDENTIFIED  RECOMMENDATIONS: AWAIT BIOPSY HIGH FIBER DIET PROCEED TO EGD    _______________________________ eSignedBarney Drain, MD 04/22/2014 9:40 AM

## 2014-04-22 NOTE — Op Note (Signed)
[facility] [place], [address]   ENDOSCOPY PROCEDURE REPORT  PATIENT: Sophia [name], Sophia [name]  MR#: [id number] BIRTHDATE: [birth year] , 67  yrs. old GENDER: Female  ENDOSCOPIST: Barney Drain, MD REFERRED [id number] Moshe Cipro, M.D.  PROCEDURE DATE: 04/22/2014 PROCEDURE:   EGD w/ biopsy  INDICATIONS:Anemia.: APR 2015-Hb 11.5, MCV 77.2, FERRITIN 192, HEME NEG x1 MEDICATIONS: TCS+ 25 mg IV and Versed 1mg  IV TOPICAL ANESTHETIC:   Viscous Xylocaine  DESCRIPTION OF PROCEDURE:     Physical exam was performed.  Informed consent was obtained from the patient after explaining the benefits, risks, and alternatives to the procedure.  The patient was connected to the monitor and placed in the left lateral position.  Continuous oxygen was provided by nasal cannula and IV medicine administered through an indwelling cannula.  After administration of sedation, the patients esophagus was intubated and the EC-3890Li ([id number]) and EG-2990i ([id number])  endoscope was advanced under direct visualization to the second portion of the duodenum.  The scope was removed slowly by carefully examining the color, texture, anatomy, and integrity of the mucosa on the way out.  The patient was recovered in endoscopy and discharged home in satisfactory condition.   ESOPHAGUS: The mucosa of the esophagus appeared normal.   A small hiatal hernia was noted.   STOMACH: Non-erosive gastritis (inflammation) was found in the gastric body and gastric antrum. Multiple biopsies were performed using cold forceps.   DUODENUM: The duodenal mucosa showed no abnormalities in the bulb and second portion of the duodenum.  Cold forcep biopsies were taken in the second portion TO EVALUATE FOR CELIAC SPRUE. COMPLICATIONS:   None  ENDOSCOPIC IMPRESSION: 1.   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   Small hiatal hernia 3.   MODERATE Non-erosive gastritis  RECOMMENDATIONS: CONTINUE PREVACID.   INCREASE TO TWICE DAILY FOR 3 MOS. STRICTLY AVOID ASPIRIN, VOLTAREN, BC/GOODY POWDERS, IBUPROFEN/MOTRIN, OR NAPROXEN/ALEVE FOR 2 MOS.  USE TYLENOL AS NEEDED FOR PAIN. AVOID ITEMS THAT TRIGGER GASTRITIS. FOLLOW A HIGH FIBER/DIABETIC DIET.  AVOID ITEMS THAT CAUSE BLOATING.   FOLLOW UP IN 3 MOS. RECHECK CBC/FERRITIN 1 WEEK PRIOR TO OPV.  Next colonoscopy in 10 years.   REPEAT EXAM:  _______________________________ Lorrin MaisBarney Drain, MD 04/22/2014 9:53 AM

## 2014-04-22 NOTE — Interval H&P Note (Signed)
History and Physical Interval Note:  04/22/2014 8:44 AM  Sophia [name]  has presented today for surgery, with the diagnosis of ANEMIA  The various methods of treatment have been discussed with the patient and family. After consideration of risks, benefits and other options for treatment, the patient has consented to  Procedure(s) with comments: COLONOSCOPY (N/A) - 8:30 ESOPHAGOGASTRODUODENOSCOPY (EGD) (N/A) as a surgical intervention .  The patient's history has been reviewed, patient examined, no change in status, stable for surgery.  I have reviewed the patient's chart and labs.  Questions were answered to the patient's satisfaction.     Sandi L Fields

## 2014-04-22 NOTE — Discharge Instructions (Signed)
You had 2 polyps removed. You have SMALL internal hemorrhoids.  YOUR SLIGHTLY LOW BLOOD COUNT IS MOST LIKELY DUE TO gastritis. YOU HAVE A SMALL HIATAL HERNIA. I biopsied your stomach, & DUODENUM.   CONTINUE PREVACID. INCREASE TO TWICE DAILY FOR 3 MOS.  STRICTLY AVOID ASPIRIN, VOLTAREN, BC/GOODY POWDERS, IBUPROFEN/MOTRIN, OR NAPROXEN/ALEVE FOR 2 MOS. USE TYLENOL AS NEEDED FOR PAIN.  AVOID ITEMS THAT TRIGGER GASTRITIS. SEE INFO BELOW.  FOLLOW A HIGH FIBER/DIABETIC DIET. AVOID ITEMS THAT CAUSE BLOATING. SEE INFO BELOW ON HIGH FIBER DIET.  YOUR BIOPSY RESULTS WILL BE BACK IN 7 DAYS.  FOLLOW UP IN 3 MOS.   Next colonoscopy in 10 years.   ENDOSCOPY Care After Read the instructions outlined below and refer to this sheet in the next week. These discharge instructions provide you with general information on caring for yourself after you leave the hospital. While your treatment has been planned according to the most current medical practices available, unavoidable complications occasionally occur. If you have any problems or questions after discharge, call DR. Shayona Hibbitts, [phone].  ACTIVITY  You may resume your regular activity, but move at a slower pace for the next 24 hours.   Take frequent rest periods for the next 24 hours.   Walking will help get rid of the air and reduce the bloated feeling in your belly (abdomen).   No driving for 24 hours (because of the medicine (anesthesia) used during the test).   You may shower.   Do not sign any important legal documents or operate any machinery for 24 hours (because of the anesthesia used during the test).    NUTRITION  Drink plenty of fluids.   You may resume your normal diet as instructed by your doctor.   Begin with a light meal and progress to your normal diet. Heavy or fried foods are harder to digest and may make you feel sick to your stomach (nauseated).   Avoid alcoholic beverages for 24 hours or as instructed.     MEDICATIONS  You may resume your normal medications.   WHAT YOU CAN EXPECT TODAY  Some feelings of bloating in the abdomen.   Passage of more gas than usual.   Spotting of blood in your stool or on the toilet paper  .  IF YOU HAD POLYPS REMOVED DURING THE ENDOSCOPY:  Eat a soft diet IF YOU HAVE NAUSEA, BLOATING, ABDOMINAL PAIN, OR VOMITING.    FINDING OUT THE RESULTS OF YOUR TEST Not all test results are available during your visit. DR. Oneida Alar WILL CALL YOU WITHIN 7 DAYS OF YOUR PROCEDUE WITH YOUR RESULTS. Do not assume everything is normal if you have not heard from DR. Louanna Vanliew IN ONE WEEK, CALL HER OFFICE AT [phone].  SEEK IMMEDIATE MEDICAL ATTENTION AND CALL THE OFFICE: [phone] IF:  You have more than a spotting of blood in your stool.   Your belly is swollen (abdominal distention).   You are nauseated or vomiting.   You have a temperature over 101F.   You have abdominal pain or discomfort that is severe or gets worse throughout the day.   Gastritis  Gastritis is an inflammation (the body's way of reacting to injury and/or infection) of the stomach. It is often caused by viral or bacterial (germ) infections. It can also be caused BY ASPIRIN, BC/GOODY POWDER'S, (IBUPROFEN) MOTRIN, OR ALEVE (NAPROXEN), chemicals (including alcohol), SPICY FOODS, and medications. This illness may be associated with generalized malaise (feeling tired, not well), UPPER ABDOMINAL STOMACH cramps, and fever.  One common bacterial cause of gastritis is an organism known as H. Pylori. This can be treated with antibiotics.    Hiatal Hernia A hiatal hernia occurs when a part of the stomach slides above the diaphragm. The diaphragm is the thin muscle separating the belly (abdomen) from the chest. A hiatal hernia can be something you are born with or develop over time. Hiatal hernias may allow stomach acid to flow back into your esophagus, the tube which carries food from your mouth to  your stomach. If this acid causes problems it is called GERD (gastro-esophageal reflux disease).   SYMPTOMS Common symptoms of GERD are heartburn (burning in your chest). This is worse when lying down or bending over. It may also cause belching and indigestion. Some of the things which make GERD worse are:  Increased weight pushes on stomach making acid rise more easily.   Smoking markedly increases acid production.   Alcohol decreases lower esophageal sphincter pressure (valve between stomach and esophagus), allowing acid from stomach into esophagus.   Late evening meals and going to bed with a full stomach increases pressure.   Anything that causes an increase in acid production.    HOME CARE INSTRUCTIONS  Try to achieve and maintain an ideal body weight.   Avoid drinking alcoholic beverages.   DO NOT smokE.   Do not wear tight clothing around your chest or stomach.   Eat smaller meals and eat more frequently. This keeps your stomach from getting too full. Eat slowly.   Do not lie down for 2 or 3 hours after eating. Do not eat or drink anything 1 to 2 hours before going to bed.   Avoid caffeine beverages (colas, coffee, cocoa, tea), fatty foods, citrus fruits and all other foods and drinks that contain acid and that seem to increase the problems.   Avoid bending over, especially after eating OR STRAINING. Anything that increases the pressure in your belly increases the amount of acid that may be pushed up into your esophagus.    High-Fiber Diet A high-fiber diet changes your normal diet to include more whole grains, legumes, fruits, and vegetables. Changes in the diet involve replacing refined carbohydrates with unrefined foods. The calorie level of the diet is essentially unchanged. The Dietary Reference Intake (recommended amount) for adult males is 38 grams per day. For adult females, it is 25 grams per day. Pregnant and lactating women should consume 28 grams of fiber per  day. Fiber is the intact part of a plant that is not broken down during digestion. Functional fiber is fiber that has been isolated from the plant to provide a beneficial effect in the body. PURPOSE  Increase stool bulk.   Ease and regulate bowel movements.   Lower cholesterol.  INDICATIONS THAT YOU NEED MORE FIBER  Constipation and hemorrhoids.   Uncomplicated diverticulosis (intestine condition) and irritable bowel syndrome.   Weight management.   As a protective measure against hardening of the arteries (atherosclerosis), diabetes, and cancer.   GUIDELINES FOR INCREASING FIBER IN THE DIET  Start adding fiber to the diet slowly. A gradual increase of about 5 more grams (2 slices of whole-wheat bread, 2 servings of most fruits or vegetables, or 1 bowl of high-fiber cereal) per day is best. Too rapid an increase in fiber may result in constipation, flatulence, and bloating.   Drink enough water and fluids to keep your urine clear or pale yellow. Water, juice, or caffeine-free drinks are recommended. Not drinking enough fluid may  cause constipation.   Eat a variety of high-fiber foods rather than one type of fiber.   Try to increase your intake of fiber through using high-fiber foods rather than fiber pills or supplements that contain small amounts of fiber.   The goal is to change the types of food eaten. Do not supplement your present diet with high-fiber foods, but replace foods in your present diet.  INCLUDE A VARIETY OF FIBER SOURCES  Replace refined and processed grains with whole grains, canned fruits with fresh fruits, and incorporate other fiber sources. White rice, white breads, and most bakery goods contain little or no fiber.   Brown whole-grain rice, buckwheat oats, and many fruits and vegetables are all good sources of fiber. These include: broccoli, Brussels sprouts, cabbage, cauliflower, beets, sweet potatoes, white potatoes (skin on), carrots, tomatoes, eggplant,  squash, berries, fresh fruits, and dried fruits.   Cereals appear to be the richest source of fiber. Cereal fiber is found in whole grains and bran. Bran is the fiber-rich outer coat of cereal grain, which is largely removed in refining. In whole-grain cereals, the bran remains. In breakfast cereals, the largest amount of fiber is found in those with "bran" in their names. The fiber content is sometimes indicated on the label.   You may need to include additional fruits and vegetables each day.   In baking, for 1 cup white flour, you may use the following substitutions:   1 cup whole-wheat flour minus 2 tablespoons.   1/2 cup white flour plus 1/2 cup whole-wheat flour.   Hemorrhoids Hemorrhoids are dilated (enlarged) veins around the rectum. Sometimes clots will form in the veins. This makes them swollen and painful. These are called thrombosed hemorrhoids. Causes of hemorrhoids include:  Constipation.   Straining to have a bowel movement.   HEAVY LIFTING HOME CARE INSTRUCTIONS  Eat a well balanced diet and drink 6 to 8 glasses of water every day to avoid constipation. You may also use a bulk laxative.   Avoid straining to have bowel movements.   Keep anal area dry and clean.   Do not use a donut shaped pillow or sit on the toilet for long periods. This increases blood pooling and pain.   Move your bowels when your body has the urge; this will require less straining and will decrease pain and pressure.   Polyps, Colon  A polyp is extra tissue that grows inside your body. Colon polyps grow in the large intestine. The large intestine, also called the colon, is part of your digestive system. It is a long, hollow tube at the end of your digestive tract where your body makes and stores stool. Most polyps are not dangerous. They are benign. This means they are not cancerous. But over time, some types of polyps can turn into cancer. Polyps that are smaller than a pea are usually not  harmful. But larger polyps could someday become or may already be cancerous. To be safe, doctors remove all polyps and test them.   WHO GETS POLYPS? Anyone can get polyps, but certain people are more likely than others. You may have a greater chance of getting polyps if: You are over 50.  You have had polyps before.  Someone in your family has had polyps.  Someone in your family has had cancer of the large intestine.  Find out if someone in your family has had polyps. You may also be more likely to get polyps if you:  Eat a lot of  fatty foods  Smoke  Drink alcohol  Do not exercise Eat too much   TREATMENT The caregiver will remove the polyp during sigmoidoscopy or colonoscopy.  PREVENTION There is not one sure way to prevent polyps. You might be able to lower your risk of getting them if you: Eat more fruits and vegetables and less fatty food.  Do not smoke.  Avoid alcohol.  Exercise every day.  Lose weight if you are overweight.  Eating more calcium and folate can also lower your risk of getting polyps. Some foods that are rich in calcium are milk, cheese, and broccoli. Some foods that are rich in folate are chickpeas, kidney beans, and spinach.

## 2014-04-25 ENCOUNTER — Encounter (HOSPITAL_COMMUNITY): Payer: Self-pay | Admitting: Gastroenterology

## 2014-04-28 ENCOUNTER — Ambulatory Visit (INDEPENDENT_AMBULATORY_CARE_PROVIDER_SITE_OTHER): Payer: 59 | Admitting: Family Medicine

## 2014-04-28 ENCOUNTER — Encounter (INDEPENDENT_AMBULATORY_CARE_PROVIDER_SITE_OTHER): Payer: Self-pay

## 2014-04-28 ENCOUNTER — Encounter: Payer: Self-pay | Admitting: Family Medicine

## 2014-04-28 VITALS — BP 124/78 | HR 72 | Resp 16 | Wt 211.8 lb

## 2014-04-28 DIAGNOSIS — K219 Gastro-esophageal reflux disease without esophagitis: Secondary | ICD-10-CM

## 2014-04-28 DIAGNOSIS — Z23 Encounter for immunization: Secondary | ICD-10-CM

## 2014-04-28 DIAGNOSIS — F17220 Nicotine dependence, chewing tobacco, uncomplicated: Secondary | ICD-10-CM

## 2014-04-28 DIAGNOSIS — E785 Hyperlipidemia, unspecified: Secondary | ICD-10-CM

## 2014-04-28 DIAGNOSIS — F3289 Other specified depressive episodes: Secondary | ICD-10-CM

## 2014-04-28 DIAGNOSIS — F17200 Nicotine dependence, unspecified, uncomplicated: Secondary | ICD-10-CM

## 2014-04-28 DIAGNOSIS — E1129 Type 2 diabetes mellitus with other diabetic kidney complication: Secondary | ICD-10-CM

## 2014-04-28 DIAGNOSIS — F329 Major depressive disorder, single episode, unspecified: Secondary | ICD-10-CM

## 2014-04-28 DIAGNOSIS — E669 Obesity, unspecified: Secondary | ICD-10-CM

## 2014-04-28 DIAGNOSIS — D126 Benign neoplasm of colon, unspecified: Secondary | ICD-10-CM | POA: Insufficient documentation

## 2014-04-28 DIAGNOSIS — I10 Essential (primary) hypertension: Secondary | ICD-10-CM

## 2014-04-28 NOTE — Patient Instructions (Signed)
F/u in Novemeber 17 or after , call if you need me  before  Fasting lipid, cmp and EGFr, hBA1C, 2nd week in July  HBA1C, chem 7 and EGFR and TSH 11/14 or after  TdAP today  Please work on cutting back dipping snuff 

## 2014-04-28 NOTE — Assessment & Plan Note (Signed)
Controlled, no change in medication Patient advised to reduce carb and sweets, commit to regular physical activity, take meds as prescribed, test blood as directed, and attempt to lose weight, to improve blood sugar control. Updated lab needed at/ before next visit.  

## 2014-04-28 NOTE — Assessment & Plan Note (Signed)
Hyperlipidemia:Low fat diet discussed and encouraged.  Updated lab needed at/ before next visit.  

## 2014-04-28 NOTE — Assessment & Plan Note (Signed)
Currently on twice daily PPI due to gastric ulceration

## 2014-04-28 NOTE — Assessment & Plan Note (Signed)
Controlled, no change in medication DASH diet and commitment to daily physical activity for a minimum of 30 minutes discussed and encouraged, as a part of hypertension management. The importance of attaining a healthy weight is also discussed.  

## 2014-04-28 NOTE — Assessment & Plan Note (Signed)
Marked improvement, no ned for therapy continue med

## 2014-04-28 NOTE — Assessment & Plan Note (Addendum)
Cutting back on use Patient counseled for approximately 5 minutes regarding the health risks of ongoing nicotine use, specifically all types of cancer, heart disease, stroke and respiratory failure. The options available for help with cessation ,the behavioral changes to assist the process, and the option to either gradully reduce usage  Or abruptly stop.is also discussed. Pt is also encouraged to set specific goals in reducing snuff usage with a plan to quitting

## 2014-04-28 NOTE — Assessment & Plan Note (Signed)
Improved. Pt applauded on succesful weight loss through lifestyle change, and encouraged to continue same. Weight loss goal set for the next several months.  

## 2014-04-28 NOTE — Progress Notes (Signed)
Subjective:    Patient ID: Sophia [name], female    DOB: 1951-10-10, 63 y.o.   MRN: [id number]  HPI The PT is here for follow up and re-evaluation of chronic medical conditions, medication management and review of any available recent lab and radiology data.  Preventive health is updated, specifically  Cancer screening and Immunization.   Questions or concerns regarding consultations or procedures which the PT has had in the interim are  Addressed.Has had upper and lower endo recently results reviewed, she has f/u with gI in 3 month The PT denies any adverse reactions to current medications since the last visit.  There are no new concerns.  There are no specific complaints  Feels better than last visit, depression resolved and successful weight loss      Review of Systems  See HPI  Denies recent fever or chills. Denies sinus pressure, nasal congestion, ear pain or sore throat. Denies chest congestion, productive cough or wheezing. Denies chest pains, palpitations and leg swelling Denies abdominal pain, nausea, vomiting,diarrhea or constipation.   Denies dysuria, frequency, hesitancy or incontinence. Denies uncontrolled  joint pain, swelling and limitation in mobility. Denies headaches, seizures, numbness, or tingling. Denies uncontrolled  depression, anxiety or insomnia. Denies skin break down or rash.        Objective:   Physical Exam  BP 124/78  Pulse 72  Resp 16  Wt 211 lb 12.8 oz (96.072 kg)  SpO2 97% Patient alert and oriented and in no cardiopulmonary distress.  HEENT: No facial asymmetry, EOMI,   oropharynx pink and moist.  Neck supple no JVD, no mass.  Chest: Clear to auscultation bilaterally.  CVS: S1, S2 no murmurs, no S3.  ABD: Soft non tender.   Ext: No edema  MS: Adequate though reduced  ROM spine, shoulders, hips and knees.  Skin: Intact, no ulcerations or rash noted.  Psych: Good eye contact, normal affect. Memory intact not anxious or  depressed appearing.  CNS: CN 2-12 intact, power,  normal throughout.no focal deficits noted.       Assessment & Plan:  HYPERTENSION Controlled, no change in medication DASH diet and commitment to daily physical activity for a minimum of 30 minutes discussed and encouraged, as a part of hypertension management. The importance of attaining a healthy weight is also discussed.   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 Updated lab needed at/ before next visit.   Hyperlipidemia LDL goal < 100 Hyperlipidemia:Low fat diet discussed and encouraged.  Updated lab needed at/ before next visit.   OBESITY, UNSPECIFIED Improved. Pt applauded on succesful weight loss through lifestyle change, and encouraged to continue same. Weight loss goal set for the next several months.   DEPRESSION Marked improvement, no ned for therapy continue med  GERD Currently on twice daily PPI due to gastric ulceration  Chewing tobacco nicotine dependence, uncomplicated Cutting back on use Patient counseled for approximately 5 minutes regarding the health risks of ongoing nicotine use, specifically all types of cancer, heart disease, stroke and respiratory failure. The options available for help with cessation ,the behavioral changes to assist the process, and the option to either gradully reduce usage  Or abruptly stop.is also discussed. Pt is also encouraged to set specific goals in reducing  snuff usage with a plan to quitting

## 2014-04-29 ENCOUNTER — Other Ambulatory Visit: Payer: Self-pay

## 2014-04-29 MED ORDER — SITAGLIPTIN PHOSPHATE 100 MG PO TABS: ORAL_TABLET | ORAL | Status: DC

## 2014-04-29 NOTE — Addendum Note (Signed)
Addended by: Eual Fines on: 04/29/2014 08:03 AM   Modules accepted: Orders

## 2014-05-05 ENCOUNTER — Telehealth: Payer: Self-pay | Admitting: Gastroenterology

## 2014-05-05 NOTE — Telephone Encounter (Signed)
Please call pt. HER stomach Bx shows gastritis. 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    CONTINUE PREVACID. INCREASE TO TWICE DAILY FOR 3 MOS.  STRICTLY AVOID ASPIRIN, VOLTAREN, BC/GOODY POWDERS, IBUPROFEN/MOTRIN, OR NAPROXEN/ALEVE FOR 2 MOS. USE TYLENOL AS NEEDED FOR PAIN.  AVOID ITEMS THAT TRIGGER GASTRITIS.  FOLLOW A HIGH FIBER/DIABETIC DIET. AVOID ITEMS THAT CAUSE BLOATING.   FOLLOW UP IN SEP 2015. SHE NEEDS A CBC/FERRITIN 1 WEEK PRIOR TO OPV. IF BLOOD COUNT IS LOWER SHE WILL NEED A CAPSULE STUDY.  Next colonoscopy in 5-10 years.

## 2014-05-06 NOTE — Telephone Encounter (Signed)
LMOM to call.

## 2014-05-09 NOTE — Telephone Encounter (Signed)
Pt called and was informed of results.  

## 2014-05-11 NOTE — Telephone Encounter (Signed)
Pt has OV scheduled for Sept 1st and reminder in epic to have labs done one week prior to OV

## 2014-05-24 LAB — COMPLETE METABOLIC PANEL WITH GFR
ALBUMIN: 4.6 g/dL (ref 3.5–5.2)
ALT: 30 U/L (ref 0–35)
AST: 21 U/L (ref 0–37)
Alkaline Phosphatase: 64 U/L (ref 39–117)
BUN: 22 mg/dL (ref 6–23)
CALCIUM: 9.3 mg/dL (ref 8.4–10.5)
CO2: 28 meq/L (ref 19–32)
Chloride: 101 mEq/L (ref 96–112)
Creat: 0.91 mg/dL (ref 0.50–1.10)
GFR, EST AFRICAN AMERICAN: 77 mL/min
GFR, Est Non African American: 67 mL/min
Glucose, Bld: 103 mg/dL — ABNORMAL HIGH (ref 70–99)
POTASSIUM: 4.1 meq/L (ref 3.5–5.3)
SODIUM: 140 meq/L (ref 135–145)
TOTAL PROTEIN: 7.5 g/dL (ref 6.0–8.3)
Total Bilirubin: 0.4 mg/dL (ref 0.2–1.2)

## 2014-05-24 LAB — LIPID PANEL
Cholesterol: 163 mg/dL (ref 0–200)
HDL: 54 mg/dL (ref >39)
LDL CALC: 81 mg/dL (ref 0–99)
Total CHOL/HDL Ratio: 3 Ratio
Triglycerides: 138 mg/dL (ref <150)
VLDL: 28 mg/dL (ref 0–40)

## 2014-05-24 LAB — HEMOGLOBIN A1C
Hgb A1c MFr Bld: 6.5 % — ABNORMAL HIGH (ref <5.7)
Mean Plasma Glucose: 140 mg/dL — ABNORMAL HIGH (ref <117)

## 2014-06-24 ENCOUNTER — Telehealth: Payer: Self-pay | Admitting: Gastroenterology

## 2014-06-24 NOTE — Telephone Encounter (Signed)
Pt has OV on 9/1, but the August recall has that she needs CBC/FERRITIN one week prior to OV

## 2014-06-27 ENCOUNTER — Other Ambulatory Visit: Payer: Self-pay

## 2014-06-27 DIAGNOSIS — D649 Anemia, unspecified: Secondary | ICD-10-CM

## 2014-06-27 NOTE — Telephone Encounter (Signed)
Mailing the lab orders to pt with note to do one week prior to [place].

## 2014-07-04 LAB — HM DIABETES EYE EXAM

## 2014-07-08 ENCOUNTER — Encounter (HOSPITAL_COMMUNITY): Payer: Self-pay | Admitting: Pharmacy Technician

## 2014-07-12 ENCOUNTER — Ambulatory Visit (HOSPITAL_COMMUNITY)
Admission: RE | Admit: 2014-07-12 | Discharge: 2014-07-12 | Disposition: A | Discharge status: Home / Self Care | Payer: 59 | Source: Ambulatory Visit | Attending: Ophthalmology | Admitting: Ophthalmology

## 2014-07-12 ENCOUNTER — Encounter (HOSPITAL_COMMUNITY)
Admission: RE | Disposition: A | Discharge status: Home / Self Care | Payer: Self-pay | Source: Ambulatory Visit | Attending: Ophthalmology

## 2014-07-12 DIAGNOSIS — E119 Type 2 diabetes mellitus without complications: Secondary | ICD-10-CM | POA: Diagnosis not present

## 2014-07-12 DIAGNOSIS — Z79899 Other long term (current) drug therapy: Secondary | ICD-10-CM | POA: Insufficient documentation

## 2014-07-12 DIAGNOSIS — H26499 Other secondary cataract, unspecified eye: Secondary | ICD-10-CM | POA: Diagnosis not present

## 2014-07-12 DIAGNOSIS — I10 Essential (primary) hypertension: Secondary | ICD-10-CM | POA: Insufficient documentation

## 2014-07-12 HISTORY — PX: YAG LASER APPLICATION: SHX6189

## 2014-07-12 LAB — CBC WITH DIFFERENTIAL/PLATELET
BASOS ABS: 0 10*3/uL (ref 0.0–0.1)
Basophils Relative: 0 % (ref 0–1)
EOS ABS: 0.2 10*3/uL (ref 0.0–0.7)
EOS PCT: 3 % (ref 0–5)
HEMATOCRIT: 36.6 % (ref 36.0–46.0)
Hemoglobin: 11.9 g/dL — ABNORMAL LOW (ref 12.0–15.0)
Lymphocytes Relative: 42 % (ref 12–46)
Lymphs Abs: 2.3 10*3/uL (ref 0.7–4.0)
MCH: 25.5 pg — AB (ref 26.0–34.0)
MCHC: 32.5 g/dL (ref 30.0–36.0)
MCV: 78.5 fL (ref 78.0–100.0)
MONO ABS: 0.5 10*3/uL (ref 0.1–1.0)
Monocytes Relative: 9 % (ref 3–12)
Neutro Abs: 2.5 10*3/uL (ref 1.7–7.7)
Neutrophils Relative %: 46 % (ref 43–77)
Platelets: 232 10*3/uL (ref 150–400)
RBC: 4.66 MIL/uL (ref 3.87–5.11)
RDW: 16.7 % — AB (ref 11.5–15.5)
WBC: 5.4 10*3/uL (ref 4.0–10.5)

## 2014-07-12 LAB — FERRITIN: Ferritin: 262 ng/mL (ref 10–291)

## 2014-07-12 SURGERY — TREATMENT, USING YAG LASER
Anesthesia: LOCAL | Laterality: Right

## 2014-07-12 MED ORDER — CYCLOPENTOLATE-PHENYLEPHRINE 0.2-1 % OP SOLN
1.0000 [drp] | OPHTHALMIC | Status: AC
  Administered 2014-07-12 (×2): 1 [drp] via OPHTHALMIC

## 2014-07-12 MED ORDER — CYCLOPENTOLATE-PHENYLEPHRINE OP SOLN OPTIME - NO CHARGE
OPHTHALMIC | Status: AC
  Filled 2014-07-12: qty 2

## 2014-07-12 NOTE — Op Note (Signed)
Tali Cleaves T. Gershon Crane, MD  Procedure: Yag Capsulotomy  Yag Laser Self Test Completedyes. Procedure: Posterior Capsulotomy, Eye Protection Worn by Staff yes. Laser In Use Sign on Door yes.  Laser: Nd:YAG Spot Size: Fixed Burst Mode: III Power Setting: 3.6 mJ/burst Number of shots: 15 Total energy delivered: 53.2 mJ   The patient tolerated the procedure without difficulty. No complications were encountered.   The patient was discharged home with the instructions to continue all her current glaucoma medications, if any.   Patient instructed to go to office at 0100 for intraocular pressure check.  Patient verbalizes understanding of discharge instructions Yes.  .    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

## 2014-07-12 NOTE — Discharge Instructions (Signed)
Sophia [name]  07/12/2014     Instructions    Activity: No Restrictions.   Diet: Resume Diet you were on at home.   Pain Medication: Tylenol if Needed.   CONTACT YOUR DOCTOR IF YOU HAVE PAIN, REDNESS IN YOUR EYE, OR DECREASED VISION.   Follow-up:today with Elta Guadeloupe T. Gershon Crane, MD.   Dr. Loran Senters: [phone]     If you find that you cannot contact your physician, but feel that your signs and   Symptoms warrant a physician's attention, call the Emergency Room at   [phone].   Othern/a.

## 2014-07-12 NOTE — H&P (Signed)
The patient was re examined and there is no change in the patients condition since the original H and P. 

## 2014-07-14 ENCOUNTER — Encounter (HOSPITAL_COMMUNITY): Payer: Self-pay | Admitting: Ophthalmology

## 2014-07-19 ENCOUNTER — Ambulatory Visit (INDEPENDENT_AMBULATORY_CARE_PROVIDER_SITE_OTHER): Payer: 59 | Admitting: Gastroenterology

## 2014-07-19 ENCOUNTER — Encounter: Payer: Self-pay | Admitting: Gastroenterology

## 2014-07-19 VITALS — BP 119/69 | HR 76 | Temp 98.4°F | Ht 68.0 in | Wt 211.4 lb

## 2014-07-19 DIAGNOSIS — D649 Anemia, unspecified: Secondary | ICD-10-CM

## 2014-07-19 DIAGNOSIS — K219 Gastro-esophageal reflux disease without esophagitis: Secondary | ICD-10-CM

## 2014-07-19 MED ORDER — LANSOPRAZOLE 30 MG PO CPDR: 30.0000 mg | DELAYED_RELEASE_CAPSULE | Freq: Every day | ORAL | Status: DC

## 2014-07-19 NOTE — Patient Instructions (Signed)
Decrease Prevacid to once each morning, 30 minutes before breakfast.   I would like to recheck your blood work and a stool sample in 3 months. We will hold off on a capsule study now.

## 2014-07-19 NOTE — Progress Notes (Signed)
Referring Provider: Fayrene Helper, MD Primary Care Physician:  Tula Nakayama, MD Primary GI: Dr. Oneida Alar   Chief Complaint  Patient presents with  . Follow-up    HPI:   Sophia [name] presents today in follow-up with history of microcytic anemia in setting of NSAID use. No overt signs of GI bleeding. No changes in bowel habits. Prevacid BID X 3 months. Iron daily with breakfast. Colonoscopy/EGD on file with findings as below. Question microcytic anemia secondary to NSAID use. Repeat CBC/ferritin stable with actually improved ferritin at 262 (was 192 5 months ago).   Past Medical History  Diagnosis Date  . PONV (postoperative nausea and vomiting)   . Hypercholesteremia   . GERD (gastroesophageal reflux disease)   . Hypertension   . Anxiety   . Depression   . Diabetes mellitus without complication   . Arthritis     Past Surgical History  Procedure Laterality Date  . Breast surgery Left     benign tumor  . Abdominal hysterectomy    . Abdominal surgery      removal of tumors  . Cataract extraction w/phaco Right 01/11/2014    Procedure: CATARACT EXTRACTION PHACO AND INTRAOCULAR LENS PLACEMENT (IOC);  Surgeon: Elta Guadeloupe T. Gershon Crane, MD;  Location: [facility];  Service: Ophthalmology;  Laterality: Right;  CDE 5.57  . Cataract extraction w/phaco Left 01/25/2014    Procedure: CATARACT EXTRACTION PHACO AND INTRAOCULAR LENS PLACEMENT (IOC);  Surgeon: Elta Guadeloupe T. Gershon Crane, MD;  Location: [facility];  Service: Ophthalmology;  Laterality: Left;  CDE:5.19  . Upper gastrointestinal endoscopy  2007 CHEST PAIN    NL EXAM  . Colonoscopy  2007 BRBPR    NL EXAM  . Colonoscopy N/A 04/22/2014    Dr. Fields:Normal mucosa in the terminal ileum/Two COLON polyps REMOVED/ Mild diverticulosis in the ascending colon and transverse colon/The LEFT colon IS redundant/Small internal hemorrhoids. Path: tubular adenoma. Next colonoscopy in 5-10 years  . Esophagogastroduodenoscopy N/A 04/22/2014    Dr.  Fields:MICROCYTIC ANEMIA MOST LIKELY DUE TO ASA/VOLTAREN/Small hiatal hernia/MODERATE Non-erosive gastritis. Negative H.pylori  . Yag laser application Right 1/69/6789    Procedure: YAG LASER APPLICATION;  Surgeon: Elta Guadeloupe T. Gershon Crane, MD;  Location: [facility];  Service: Ophthalmology;  Laterality: Right;    Current Outpatient Prescriptions  Medication Sig Dispense Refill  . acetaminophen (TYLENOL) 500 MG tablet Take 1,000 mg by mouth every 6 (six) hours as needed for mild pain.      . benazepril-hydrochlorthiazide (LOTENSIN HCT) 20-12.5 MG per tablet TAKE 2 TABLETS BY MOUTH ONCE DAILY  180 tablet  PRN  . cholecalciferol (VITAMIN D) 1000 UNITS tablet Take 1,000 Units by mouth daily.      Marland Kitchen diltiazem (CARDIZEM CD) 180 MG 24 hr capsule Take 180 mg by mouth daily.      . ferrous sulfate 325 (65 FE) MG EC tablet Take 325 mg by mouth daily with breakfast.      . FLUoxetine (PROZAC) 20 MG capsule Take 20 mg by mouth daily.      . fluticasone (FLONASE) 50 MCG/ACT nasal spray Place 1 spray into both nostrils daily.      . lansoprazole (PREVACID) 30 MG capsule Take 1 capsule (30 mg total) by mouth daily before breakfast.  30 capsule  11  . Liniments (SALONPAS ARTHRITIS PAIN RELIEF) PADS Apply 1 each topically daily as needed (pain).      . Multiple Vitamin (MULTIVITAMIN WITH MINERALS) TABS tablet Take 1 tablet by mouth daily.      Marland Kitchen  Omega-3 Fatty Acids (FISH OIL) 1000 MG CAPS Take 1 capsule by mouth daily.      . pravastatin (PRAVACHOL) 40 MG tablet TAKE 1 TABLET (40 MG TOTAL) BY MOUTH EVERY EVENING.  90 tablet  1  . sitaGLIPtin (JANUVIA) 100 MG tablet TAKE 1 TABLET BY MOUTH DAILY  90 tablet  1   No current facility-administered medications for this visit.    Allergies as of 07/19/2014  . (No Known Allergies)    Family History  Problem Relation Age of Onset  . Colon polyps Sister 58  . Colon cancer Neg Hx     History   Social History  . Marital Status: Married    Spouse Name: N/A    Number of  Children: N/A  . Years of Education: N/A   Social History Main Topics  . Smoking status: Former Smoker -- 0.50 packs/day for 20 years    Types: Cigarettes    Quit date: 01/05/1990  . Smokeless tobacco: Current User    Types: Snuff  . Alcohol Use: No  . Drug Use: No  . Sexual Activity: Yes    Birth Control/ Protection: Surgical   Other Topics Concern  . None   Social History Narrative  . None    Review of Systems: As mentioned in HPI.   Physical Exam: BP 119/69  Pulse 76  Temp(Src) 98.4 F (36.9 C) (Oral)  Ht 5\' 8"  (1.727 m)  Wt 211 lb 6.4 oz (95.89 kg)  BMI 32.15 kg/m2 General:   Alert and oriented. No distress noted. Pleasant and cooperative.  Head:  Normocephalic and atraumatic. Abdomen:  +BS, soft, non-tender and non-distended. No rebound or guarding. No HSM or masses noted. Msk:  Symmetrical without gross deformities. Normal posture Extremities:  Without edema. Neurologic:  Alert and  oriented x4;  grossly normal neurologically. Skin:  Intact without significant lesions or rashes. Psych:  Alert and cooperative. Normal mood and affect.  Lab Results  Component Value Date   WBC 5.4 07/12/2014   HGB 11.9* 07/12/2014   HCT 36.6 07/12/2014   MCV 78.5 07/12/2014   PLT 232 07/12/2014   Lab Results  Component Value Date   IRON 43 02/16/2014   TIBC 309 04/29/2011   FERRITIN 262 07/12/2014

## 2014-07-20 ENCOUNTER — Encounter: Payer: Self-pay | Admitting: Gastroenterology

## 2014-07-20 NOTE — Progress Notes (Signed)
REVIEWED-ADDITIONAL RECOMMENDATIONS-NO NEED FOR iFOBT. TCS UP TO DATE. AGREE WITH REPEAT CBC/FERRITIN.

## 2014-07-20 NOTE — Assessment & Plan Note (Signed)
Decrease to Prevacid once daily.

## 2014-07-20 NOTE — Assessment & Plan Note (Signed)
Ferritin improved from 5 months ago with iron. Hgb remains normal. TCS/EGD on file. Hold on capsule study now. Continue to avoid NSAIDs. Likely culprit of microcytic anemia. Recheck CBC/ferritin in 3 months along with ifobt. If any changes, proceed with capsule.

## 2014-07-21 NOTE — Progress Notes (Signed)
CC TO PCP °

## 2014-08-01 NOTE — Progress Notes (Signed)
Please let patient know she does not need to complete an ifobt as per Dr. Oneida Alar.

## 2014-08-02 NOTE — Progress Notes (Signed)
LMOM to call.

## 2014-08-04 ENCOUNTER — Other Ambulatory Visit: Payer: Self-pay

## 2014-08-04 DIAGNOSIS — D509 Iron deficiency anemia, unspecified: Secondary | ICD-10-CM

## 2014-08-08 NOTE — Progress Notes (Signed)
Letter mailed to pt with the info.  

## 2014-09-02 ENCOUNTER — Encounter (HOSPITAL_COMMUNITY): Payer: Self-pay | Admitting: Emergency Medicine

## 2014-09-02 ENCOUNTER — Emergency Department (HOSPITAL_COMMUNITY)
Admission: EM | Admit: 2014-09-02 | Discharge: 2014-09-03 | Disposition: A | Discharge status: Home / Self Care | Payer: 59 | Attending: Emergency Medicine | Admitting: Emergency Medicine

## 2014-09-02 ENCOUNTER — Emergency Department (HOSPITAL_COMMUNITY): Payer: 59

## 2014-09-02 DIAGNOSIS — M1990 Unspecified osteoarthritis, unspecified site: Secondary | ICD-10-CM | POA: Diagnosis not present

## 2014-09-02 DIAGNOSIS — F419 Anxiety disorder, unspecified: Secondary | ICD-10-CM | POA: Diagnosis not present

## 2014-09-02 DIAGNOSIS — Z7952 Long term (current) use of systemic steroids: Secondary | ICD-10-CM | POA: Diagnosis not present

## 2014-09-02 DIAGNOSIS — E780 Pure hypercholesterolemia: Secondary | ICD-10-CM | POA: Diagnosis not present

## 2014-09-02 DIAGNOSIS — R079 Chest pain, unspecified: Secondary | ICD-10-CM | POA: Diagnosis present

## 2014-09-02 DIAGNOSIS — K219 Gastro-esophageal reflux disease without esophagitis: Secondary | ICD-10-CM

## 2014-09-02 DIAGNOSIS — Z87891 Personal history of nicotine dependence: Secondary | ICD-10-CM | POA: Diagnosis not present

## 2014-09-02 DIAGNOSIS — E119 Type 2 diabetes mellitus without complications: Secondary | ICD-10-CM | POA: Insufficient documentation

## 2014-09-02 DIAGNOSIS — D649 Anemia, unspecified: Secondary | ICD-10-CM | POA: Diagnosis not present

## 2014-09-02 DIAGNOSIS — Z79899 Other long term (current) drug therapy: Secondary | ICD-10-CM | POA: Insufficient documentation

## 2014-09-02 DIAGNOSIS — I10 Essential (primary) hypertension: Secondary | ICD-10-CM | POA: Insufficient documentation

## 2014-09-02 DIAGNOSIS — Z7982 Long term (current) use of aspirin: Secondary | ICD-10-CM | POA: Insufficient documentation

## 2014-09-02 DIAGNOSIS — F329 Major depressive disorder, single episode, unspecified: Secondary | ICD-10-CM | POA: Diagnosis not present

## 2014-09-02 LAB — CBC WITH DIFFERENTIAL/PLATELET
Basophils Absolute: 0 10*3/uL (ref 0.0–0.1)
Basophils Relative: 0 % (ref 0–1)
EOS ABS: 0.2 10*3/uL (ref 0.0–0.7)
EOS PCT: 2 % (ref 0–5)
HCT: 35.7 % — ABNORMAL LOW (ref 36.0–46.0)
Hemoglobin: 11.6 g/dL — ABNORMAL LOW (ref 12.0–15.0)
Lymphocytes Relative: 40 % (ref 12–46)
Lymphs Abs: 2.7 10*3/uL (ref 0.7–4.0)
MCH: 26.3 pg (ref 26.0–34.0)
MCHC: 32.5 g/dL (ref 30.0–36.0)
MCV: 81 fL (ref 78.0–100.0)
MONOS PCT: 10 % (ref 3–12)
Monocytes Absolute: 0.7 10*3/uL (ref 0.1–1.0)
Neutro Abs: 3.2 10*3/uL (ref 1.7–7.7)
Neutrophils Relative %: 48 % (ref 43–77)
Platelets: 231 10*3/uL (ref 150–400)
RBC: 4.41 MIL/uL (ref 3.87–5.11)
RDW: 15.3 % (ref 11.5–15.5)
WBC: 6.8 10*3/uL (ref 4.0–10.5)

## 2014-09-02 LAB — COMPREHENSIVE METABOLIC PANEL
ALT: 34 U/L (ref 0–35)
AST: 21 U/L (ref 0–37)
Albumin: 4.1 g/dL (ref 3.5–5.2)
Alkaline Phosphatase: 68 U/L (ref 39–117)
Anion gap: 11 (ref 5–15)
BUN: 20 mg/dL (ref 6–23)
CO2: 29 meq/L (ref 19–32)
CREATININE: 0.7 mg/dL (ref 0.50–1.10)
Calcium: 9.3 mg/dL (ref 8.4–10.5)
Chloride: 101 mEq/L (ref 96–112)
GFR calc Af Amer: >90 mL/min (ref >90)
GFR, EST NON AFRICAN AMERICAN: 90 mL/min — AB (ref >90)
GLUCOSE: 110 mg/dL — AB (ref 70–99)
Potassium: 3.8 mEq/L (ref 3.7–5.3)
SODIUM: 141 meq/L (ref 137–147)
Total Bilirubin: <0.2 mg/dL — ABNORMAL LOW (ref 0.3–1.2)
Total Protein: 7.7 g/dL (ref 6.0–8.3)

## 2014-09-02 LAB — TROPONIN I: Troponin I: <0.3 ng/mL (ref <0.30)

## 2014-09-02 MED ORDER — GI COCKTAIL ~~LOC~~
30.0000 mL | Freq: Once | ORAL | Status: AC
  Administered 2014-09-02: 30 mL via ORAL
  Filled 2014-09-02: qty 30

## 2014-09-02 MED ORDER — ASPIRIN 81 MG PO CHEW
324.0000 mg | CHEWABLE_TABLET | Freq: Once | ORAL | Status: AC
  Administered 2014-09-02: 324 mg via ORAL
  Filled 2014-09-02: qty 4

## 2014-09-02 NOTE — ED Provider Notes (Signed)
CSN: [id number]     Arrival date & time 09/02/14  2220 History   First MD Initiated Contact with Patient 09/02/14 2300    This chart was scribed for Delora Fuel, MD by Terressa Koyanagi, ED Scribe. This patient was seen in room [facility] and the patient's care was started at 11:18 PM.  Chief Complaint  Patient presents with  . Chest Pain   The history is provided by the patient. No language interpreter was used.   PCP: Tula Nakayama, MD HPI Comments: Sophia [name] is a 63 y.o. female, with medical Hx noted below and significant for GERD and HTN, who presents to the Emergency Department complaining of atraumatic, acute, constant burning pain to the center of her chest onset today. Pt describes her pain as a sensation of pressure and rates it a 9 out of 10. Pt denies anything making it worse or better. Pt reports taking acid reflux meds to alleviate her Sx without relief. Pt denies any Hx of similar episodes. Pt denies n/v, SOB, diaphoresis.    Past Medical History  Diagnosis Date  . PONV (postoperative nausea and vomiting)   . Hypercholesteremia   . GERD (gastroesophageal reflux disease)   . Hypertension   . Anxiety   . Depression   . Diabetes mellitus without complication   . Arthritis    Past Surgical History  Procedure Laterality Date  . Breast surgery Left     benign tumor  . Abdominal hysterectomy    . Abdominal surgery      removal of tumors  . Cataract extraction w/phaco Right 01/11/2014    Procedure: CATARACT EXTRACTION PHACO AND INTRAOCULAR LENS PLACEMENT (IOC);  Surgeon: Elta Guadeloupe T. Gershon Crane, MD;  Location: [facility];  Service: Ophthalmology;  Laterality: Right;  CDE 5.57  . Cataract extraction w/phaco Left 01/25/2014    Procedure: CATARACT EXTRACTION PHACO AND INTRAOCULAR LENS PLACEMENT (IOC);  Surgeon: Elta Guadeloupe T. Gershon Crane, MD;  Location: [facility];  Service: Ophthalmology;  Laterality: Left;  CDE:5.19  . Upper gastrointestinal endoscopy  2007 CHEST PAIN    NL EXAM  . Colonoscopy   2007 [facility]    NL EXAM  . Colonoscopy N/A 04/22/2014    Dr. Fields:Normal mucosa in the terminal ileum/Two COLON polyps REMOVED/ Mild diverticulosis in the ascending colon and transverse colon/The LEFT colon IS redundant/Small internal hemorrhoids. Path: tubular adenoma. Next colonoscopy in 5-10 years  . Esophagogastroduodenoscopy N/A 04/22/2014    Dr. Fields:MICROCYTIC ANEMIA MOST LIKELY DUE TO ASA/VOLTAREN/Small hiatal hernia/MODERATE Non-erosive gastritis. Negative H.pylori  . Yag laser application Right 3/76/2831    Procedure: YAG LASER APPLICATION;  Surgeon: Elta Guadeloupe T. Gershon Crane, MD;  Location: [facility];  Service: Ophthalmology;  Laterality: Right;   Family History  Problem Relation Age of Onset  . Colon polyps Sister 17  . Colon cancer Neg Hx    History  Substance Use Topics  . Smoking status: Former Smoker -- 0.50 packs/day for 20 years    Types: Cigarettes    Quit date: 01/05/1990  . Smokeless tobacco: Current User    Types: Snuff  . Alcohol Use: No   OB History   Grav Para Term Preterm Abortions TAB SAB Ect Mult Living                 Review of Systems  Constitutional: Negative for fever, chills and diaphoresis.  Respiratory: Negative for shortness of breath.   Cardiovascular: Positive for chest pain.  Gastrointestinal: Negative for nausea and vomiting.  Psychiatric/Behavioral: Negative for confusion.  All other systems reviewed and are negative.     Allergies  Review of patient's allergies indicates no known allergies.  Home Medications   Prior to Admission medications   Medication Sig Start Date End Date Taking? Authorizing Provider  acetaminophen (TYLENOL) 500 MG tablet Take 1,000 mg by mouth every 6 (six) hours as needed for mild pain.   Yes Historical Provider, MD  aspirin EC 81 MG tablet Take 81 mg by mouth once.   Yes Historical Provider, MD  benazepril-hydrochlorthiazide (LOTENSIN HCT) 20-12.5 MG per tablet Take 2 tablets by mouth daily.   Yes Historical Provider,  MD  cholecalciferol (VITAMIN D) 1000 UNITS tablet Take 1,000 Units by mouth daily.   Yes Historical Provider, MD  diltiazem (CARDIZEM CD) 180 MG 24 hr capsule Take 180 mg by mouth daily.   Yes Historical Provider, MD  ferrous sulfate 325 (65 FE) MG EC tablet Take 325 mg by mouth daily with breakfast.   Yes Historical Provider, MD  FLUoxetine (PROZAC) 40 MG capsule Take 40 mg by mouth daily.   Yes Historical Provider, MD  lansoprazole (PREVACID) 30 MG capsule Take 1 capsule (30 mg total) by mouth daily before breakfast. 07/19/14  Yes Orvil Feil, NP  Multiple Vitamin (MULTIVITAMIN WITH MINERALS) TABS tablet Take 1 tablet by mouth daily.   Yes Historical Provider, MD  Naphazoline-Pheniramine (OPCON-A) 0.027-0.315 % SOLN Apply 1 drop to eye daily.   Yes Historical Provider, MD  Omega-3 Fatty Acids (FISH OIL) 1000 MG CAPS Take 1 capsule by mouth daily.   Yes Historical Provider, MD  polyvinyl alcohol (LUBRICANT DROPS) 1.4 % ophthalmic solution Place 1 drop into both eyes as needed for dry eyes.   Yes Historical Provider, MD  pravastatin (PRAVACHOL) 40 MG tablet Take 40 mg by mouth every evening.   Yes Historical Provider, MD  sitaGLIPtin (JANUVIA) 100 MG tablet Take 100 mg by mouth daily.   Yes Historical Provider, MD  fluticasone (FLONASE) 50 MCG/ACT nasal spray Place 1 spray into both nostrils daily.    Historical Provider, MD  [facility] ARTHRITIS PAIN RELIEF) PADS Apply 1 each topically daily as needed (pain).    Historical Provider, MD   Triage Vitals: BP 142/66  Pulse 70  Temp(Src) 98.2 F (36.8 C)  Resp 16  Ht 5' 8.5" (1.74 m)  Wt 14 lb (6.35 kg)  BMI 2.10 kg/m2  SpO2 100% Physical Exam  Nursing note and vitals reviewed. Constitutional: She is oriented to person, place, and time. She appears well-developed and well-nourished. No distress.  HENT:  Head: Normocephalic and atraumatic.  Eyes: Conjunctivae and EOM are normal. Pupils are equal, round, and reactive to light.  Neck:  Normal range of motion. Neck supple. No JVD present. No tracheal deviation present.  Cardiovascular: Normal rate and regular rhythm.   No murmur heard. Pulmonary/Chest: Effort normal and breath sounds normal. No respiratory distress. She has no wheezes. She has no rales.  Abdominal: Soft. Bowel sounds are normal. She exhibits no distension and no mass. There is no tenderness.  Musculoskeletal: Normal range of motion. She exhibits no edema.  Lymphadenopathy:    She has no cervical adenopathy.  Neurological: She is alert and oriented to person, place, and time. She has normal reflexes. No cranial nerve deficit. Coordination abnormal.  Skin: Skin is warm and dry. No rash noted.  Psychiatric: She has a normal mood and affect. Her behavior is normal. Thought content normal.    ED Course  Procedures (including critical care time)  DIAGNOSTIC STUDIES: Oxygen Saturation is 100% on RA, nl by my interpretation.    COORDINATION OF CARE: 11:24 PM-Discussed treatment plan which includes meds with pt at bedside and pt agreed to plan.   Labs Review Results for orders placed during the hospital encounter of 09/02/14  CBC WITH DIFFERENTIAL      Result Value Ref Range   WBC 6.8  4.0 - 10.5 K/uL   RBC 4.41  3.87 - 5.11 MIL/uL   Hemoglobin 11.6 (*) 12.0 - 15.0 g/dL   HCT 35.7 (*) 36.0 - 46.0 %   MCV 81.0  78.0 - 100.0 fL   MCH 26.3  26.0 - 34.0 pg   MCHC 32.5  30.0 - 36.0 g/dL   RDW 15.3  11.5 - 15.5 %   Platelets 231  150 - 400 K/uL   Neutrophils Relative % 48  43 - 77 %   Neutro Abs 3.2  1.7 - 7.7 K/uL   Lymphocytes Relative 40  12 - 46 %   Lymphs Abs 2.7  0.7 - 4.0 K/uL   Monocytes Relative 10  3 - 12 %   Monocytes Absolute 0.7  0.1 - 1.0 K/uL   Eosinophils Relative 2  0 - 5 %   Eosinophils Absolute 0.2  0.0 - 0.7 K/uL   Basophils Relative 0  0 - 1 %   Basophils Absolute 0.0  0.0 - 0.1 K/uL  TROPONIN I      Result Value Ref Range   Troponin I <0.30  <0.30 ng/mL  COMPREHENSIVE METABOLIC  PANEL      Result Value Ref Range   Sodium 141  137 - 147 mEq/L   Potassium 3.8  3.7 - 5.3 mEq/L   Chloride 101  96 - 112 mEq/L   CO2 29  19 - 32 mEq/L   Glucose, Bld 110 (*) 70 - 99 mg/dL   BUN 20  6 - 23 mg/dL   Creatinine, Ser 0.70  0.50 - 1.10 mg/dL   Calcium 9.3  8.4 - 10.5 mg/dL   Total Protein 7.7  6.0 - 8.3 g/dL   Albumin 4.1  3.5 - 5.2 g/dL   AST 21  0 - 37 U/L   ALT 34  0 - 35 U/L   Alkaline Phosphatase 68  39 - 117 U/L   Total Bilirubin <0.2 (*) 0.3 - 1.2 mg/dL   GFR calc non Af Amer 90 (*) >90 mL/min   GFR calc Af Amer >90  >90 mL/min   Anion gap 11  5 - 15   s Imaging Review Dg Chest 2 View  09/02/2014   CLINICAL DATA:  63 year old female with chest pain epigastric pain. Hypertension. High cholesterol. Diabetes. Initial encounter.  EXAM: CHEST  2 VIEW  COMPARISON:  06/17/2008.  FINDINGS: Mild cardiomegaly.  Central pulmonary vascular prominence with minimal peribronchial thickening without significant change. No segmental infiltrate, frank pulmonary edema or pneumothorax.  Calcified mildly tortuous aorta.  IMPRESSION: Mild cardiomegaly.  Central pulmonary vascular prominence with minimal peribronchial thickening without significant change. No segmental infiltrate, frank pulmonary edema or pneumothorax.  Calcified mildly tortuous aorta.   Electronically Signed   By: Chauncey Cruel M.D.   On: 09/02/2014 23:15     EKG Interpretation   Date/Time:  Friday September 02 2014 22:36:52 EDT Ventricular Rate:  66 PR Interval:  151 QRS Duration: 91 QT Interval:  430 QTC Calculation: 450 R Axis:   65 Text Interpretation:  Sinus rhythm Consider left atrial enlargement No  significant change was found Confirmed by Wyvonnia Dusky  MD, STEPHEN [phone]) on  09/02/2014 10:41:01 PM      MDM   Final diagnoses:  Chest pain, unspecified chest pain type  Gastroesophageal reflux disease, esophagitis presence not specified  Normocytic normochromic anemia    Chest pain which seems very  unlikely to be cardiac. Review of records shows that she has had difficult to control gastroesophageal reflux disease and symptoms seem consistent with that. ECG shows no acute changes. She is given a dose of GI cocktail with significant relief. Laboratory workup was significant for mild normochromic normocytic anemia. She recently had her Prevacid dose decreased from twice a day to once a day. She is advised to resume taking it twice a day and is to followup with her PCP in the next week.  I personally performed the services described in this documentation, which was scribed in my presence. The recorded information has been reviewed and is accurate.    Delora Fuel, MD 85/02/77 [id number]

## 2014-09-02 NOTE — ED Notes (Signed)
Pt states she has been feeling like she has been having "acid reflux" all day. Pt c/o a burning pain to the center of her chest that is pretty constant.

## 2014-09-03 NOTE — Discharge Instructions (Signed)
Increase your Prevacid to twice a day.  Return if symptoms are getting worse.

## 2014-09-04 ENCOUNTER — Telehealth: Payer: Self-pay | Admitting: Family Medicine

## 2014-09-04 DIAGNOSIS — E119 Type 2 diabetes mellitus without complications: Secondary | ICD-10-CM

## 2014-09-04 NOTE — Telephone Encounter (Signed)
Pls call pt, let her know that I am aware that she was in the Ed with chest pain. Shew has never seen cardiology and I believe the pain must have been worrisome for heart disease, she is also prediabetic, needs updated HBa1C , last was in July and was 6.5 , if the rept is 6.5 then she is diabetic, which puts her into a higher risk category for heart disease I am offering referral to cardiology for further evaluation based on age and risk, also ask clearly if she has any close family members with established CAD UNDER AGE 59, meaning have they had a stent , or bypass surgery to the heart,document if this is the case p,ks send me back  Message so I can know if she wants a the referral and how she is doing. Also she has no appt scheduled with the office , needs one scheduled  For Nov or Dec  (NEEDS HBA1C , pls order and explain)  ??? pls ask

## 2014-09-05 NOTE — Addendum Note (Signed)
Addended by: Denman George B on: 09/05/2014 11:59 AM   Modules accepted: Orders

## 2014-09-06 NOTE — Telephone Encounter (Signed)
Patient aware and states that she does not have any close kin with heart problems.  She thinks symptoms were more GERD.  She was given a followup appt. Lab order mailed to patient.

## 2014-09-24 LAB — HEMOGLOBIN A1C
Hgb A1c MFr Bld: 6.6 % — ABNORMAL HIGH (ref <5.7)
Mean Plasma Glucose: 143 mg/dL — ABNORMAL HIGH (ref <117)

## 2014-09-27 ENCOUNTER — Encounter: Payer: Self-pay | Admitting: Family Medicine

## 2014-09-27 ENCOUNTER — Ambulatory Visit (INDEPENDENT_AMBULATORY_CARE_PROVIDER_SITE_OTHER): Payer: 59 | Admitting: Family Medicine

## 2014-09-27 VITALS — BP 114/70 | HR 79 | Resp 16 | Ht 69.0 in | Wt 208.0 lb

## 2014-09-27 DIAGNOSIS — E119 Type 2 diabetes mellitus without complications: Secondary | ICD-10-CM

## 2014-09-27 DIAGNOSIS — K219 Gastro-esophageal reflux disease without esophagitis: Secondary | ICD-10-CM

## 2014-09-27 DIAGNOSIS — E785 Hyperlipidemia, unspecified: Secondary | ICD-10-CM

## 2014-09-27 DIAGNOSIS — F418 Other specified anxiety disorders: Secondary | ICD-10-CM

## 2014-09-27 DIAGNOSIS — I10 Essential (primary) hypertension: Secondary | ICD-10-CM

## 2014-09-27 DIAGNOSIS — F17220 Nicotine dependence, chewing tobacco, uncomplicated: Secondary | ICD-10-CM

## 2014-09-27 DIAGNOSIS — E669 Obesity, unspecified: Secondary | ICD-10-CM

## 2014-09-27 DIAGNOSIS — IMO0001 Reserved for inherently not codable concepts without codable children: Secondary | ICD-10-CM

## 2014-09-27 DIAGNOSIS — R809 Proteinuria, unspecified: Secondary | ICD-10-CM

## 2014-09-27 MED ORDER — ESOMEPRAZOLE MAGNESIUM 20 MG PO CPDR: 20.0000 mg | DELAYED_RELEASE_CAPSULE | Freq: Every day | ORAL | Status: DC

## 2014-09-27 MED ORDER — ESOMEPRAZOLE MAGNESIUM 40 MG PO CPDR: 20.0000 mg | DELAYED_RELEASE_CAPSULE | Freq: Every day | ORAL | Status: DC

## 2014-09-27 NOTE — Patient Instructions (Signed)
F/u in 4 month, call if you need me before  New for reflux is nexium 20 mg twice daily, do not take prevacid with this  Continue to cut back on snuff   Fasting lipid, cmp and EGFR and hBA1C in 4 month

## 2014-09-27 NOTE — Progress Notes (Signed)
   Subjective:    Patient ID: Sophia [name], female    DOB: 1951-03-18, 63 y.o.   MRN: [id number]  HPI  The PT is here for follow up and re-evaluation of chronic medical conditions, medication management and review of any available recent lab and radiology data.  Preventive health is updated, specifically  Cancer screening and Immunization.   Questions or concerns regarding consultations or procedures which the PT has had in the interim are  addressed. The PT denies any adverse reactions to current medications since the last visit.  C/o uncontrolled reflux symptoms, needs med change   Review of Systems See HPI Denies recent fever or chills. Denies sinus pressure, nasal congestion, ear pain or sore throat. Denies chest congestion, productive cough or wheezing. Denies chest pains, palpitations and leg swelling Denies  nausea, vomiting,diarrhea or constipation.   Denies dysuria, frequency, hesitancy or incontinence. Denies joint pain, swelling and limitation in mobility. Denies headaches, seizures, numbness, or tingling. Denies uncontrolled depression, anxiety or insomnia. Denies skin break down or rash.        Objective:   Physical Exam  BP 114/70 mmHg  Pulse 79  Resp 16  Ht 5\' 9"  (1.753 m)  Wt 208 lb (94.348 kg)  BMI 30.70 kg/m2  SpO2 97% Patient alert and oriented and in no cardiopulmonary distress.  HEENT: No facial asymmetry, EOMI,   oropharynx pink and moist.  Neck supple no JVD, no mass.  Chest: Clear to auscultation bilaterally.  CVS: S1, S2 no murmurs, no S3.Regular rate.  ABD: Soft non tender.   Ext: No edema  MS: Adequate ROM spine, shoulders, hips and knees.  Skin: Intact, no ulcerations or rash noted.  Psych: Good eye contact, normal affect. Memory intact not anxious or depressed appearing.  CNS: CN 2-12 intact, power,  normal throughout.no focal deficits noted.       Assessment & Plan:  Essential hypertension Controlled, no change in  medication DASH diet and commitment to daily physical activity for a minimum of 30 minutes discussed and encouraged, as a part of hypertension management. The importance of attaining a healthy weight is also discussed.   Obesity (BMI 30-39.9) Improved. Pt applauded on succesful weight loss through lifestyle change, and encouraged to continue same. Weight loss goal set for the next several months.   Diabetes with proteinuria Controlled, no change in medication Patient advised to reduce carb and sweets, commit to regular physical activity, take meds as prescribed, test blood as directed, and attempt to lose weight, to improve blood sugar control.   Hyperlipidemia with target LDL less than 100 Controlled, no change in medication Hyperlipidemia:Low fat diet discussed and encouraged.  Updated lab needed at/ before next visit.   Chewing tobacco nicotine dependence, uncomplicated Cutting back , still needs to quit Patient counseled for approximately 5 minutes regarding the health risks of ongoing nicotine use, specifically all types of cancer, heart disease, stroke and respiratory failure. The options available for help with cessation ,the behavioral changes to assist the process, and the option to either gradully reduce usage  Or abruptly stop.is also discussed. Pt is also encouraged to set specific goals in amount of snuff used daily, as well as to set a quit date.   Depression with anxiety Controlled, no change in medication   GERD Change in med due to uncontrolled symptoms

## 2014-09-28 ENCOUNTER — Encounter: Payer: Self-pay | Admitting: Family Medicine

## 2014-09-28 ENCOUNTER — Other Ambulatory Visit: Payer: Self-pay

## 2014-09-28 MED ORDER — PRAVASTATIN SODIUM 40 MG PO TABS: 40.0000 mg | ORAL_TABLET | Freq: Every evening | ORAL | Status: DC

## 2014-09-28 MED ORDER — SITAGLIPTIN PHOSPHATE 100 MG PO TABS: 100.0000 mg | ORAL_TABLET | Freq: Every day | ORAL | Status: DC

## 2014-10-04 ENCOUNTER — Other Ambulatory Visit: Payer: Self-pay | Admitting: *Deleted

## 2014-10-04 ENCOUNTER — Encounter: Payer: Self-pay | Admitting: *Deleted

## 2014-10-04 DIAGNOSIS — D509 Iron deficiency anemia, unspecified: Secondary | ICD-10-CM

## 2014-10-25 LAB — CBC WITH DIFFERENTIAL/PLATELET
BASOS PCT: 0 % (ref 0–1)
Basophils Absolute: 0 10*3/uL (ref 0.0–0.1)
EOS ABS: 0.2 10*3/uL (ref 0.0–0.7)
EOS PCT: 3 % (ref 0–5)
HEMATOCRIT: 39.1 % (ref 36.0–46.0)
Hemoglobin: 12.4 g/dL (ref 12.0–15.0)
Lymphocytes Relative: 45 % (ref 12–46)
Lymphs Abs: 2.6 10*3/uL (ref 0.7–4.0)
MCH: 26.1 pg (ref 26.0–34.0)
MCHC: 31.7 g/dL (ref 30.0–36.0)
MCV: 82.1 fL (ref 78.0–100.0)
MONOS PCT: 9 % (ref 3–12)
MPV: 11.3 fL (ref 9.4–12.4)
Monocytes Absolute: 0.5 10*3/uL (ref 0.1–1.0)
Neutro Abs: 2.5 10*3/uL (ref 1.7–7.7)
Neutrophils Relative %: 43 % (ref 43–77)
Platelets: 208 10*3/uL (ref 150–400)
RBC: 4.76 MIL/uL (ref 3.87–5.11)
RDW: 15.9 % — ABNORMAL HIGH (ref 11.5–15.5)
WBC: 5.8 10*3/uL (ref 4.0–10.5)

## 2014-10-25 LAB — FERRITIN: Ferritin: 297 ng/mL — ABNORMAL HIGH (ref 10–291)

## 2014-11-02 NOTE — Progress Notes (Signed)
Quick Note:  Anemia resolved. Ferritin just mildly elevated. Can stop iron. ______

## 2014-11-07 NOTE — Progress Notes (Signed)
Quick Note:  LMOM to call. ______ 

## 2014-11-29 NOTE — Assessment & Plan Note (Signed)
Cutting back , still needs to quit Patient counseled for approximately 5 minutes regarding the health risks of ongoing nicotine use, specifically all types of cancer, heart disease, stroke and respiratory failure. The options available for help with cessation ,the behavioral changes to assist the process, and the option to either gradully reduce usage  Or abruptly stop.is also discussed. Pt is also encouraged to set specific goals in amount of snuff used daily, as well as to set a quit date.

## 2014-11-29 NOTE — Assessment & Plan Note (Signed)
Controlled, no change in medication Patient advised to reduce carb and sweets, commit to regular physical activity, take meds as prescribed, test blood as directed, and attempt to lose weight, to improve blood sugar control.  

## 2014-11-29 NOTE — Assessment & Plan Note (Signed)
Controlled, no change in medication DASH diet and commitment to daily physical activity for a minimum of 30 minutes discussed and encouraged, as a part of hypertension management. The importance of attaining a healthy weight is also discussed.  

## 2014-11-29 NOTE — Assessment & Plan Note (Signed)
Controlled, no change in medication Hyperlipidemia:Low fat diet discussed and encouraged.  Updated lab needed at/ before next visit.  

## 2014-11-29 NOTE — Assessment & Plan Note (Signed)
Improved. Pt applauded on succesful weight loss through lifestyle change, and encouraged to continue same. Weight loss goal set for the next several months.  

## 2014-11-29 NOTE — Assessment & Plan Note (Signed)
Controlled, no change in medication  

## 2014-11-29 NOTE — Assessment & Plan Note (Signed)
Change in med due to uncontrolled symptoms

## 2014-12-19 ENCOUNTER — Other Ambulatory Visit: Payer: Self-pay | Admitting: Family Medicine

## 2015-01-20 ENCOUNTER — Telehealth: Payer: Self-pay | Admitting: Family Medicine

## 2015-01-20 NOTE — Telephone Encounter (Signed)
Lab order faxed to [facility]

## 2015-01-24 LAB — COMPLETE METABOLIC PANEL WITH GFR
ALT: 26 U/L (ref 0–35)
AST: 17 U/L (ref 0–37)
Albumin: 4.5 g/dL (ref 3.5–5.2)
Alkaline Phosphatase: 61 U/L (ref 39–117)
BUN: 19 mg/dL (ref 6–23)
CO2: 31 meq/L (ref 19–32)
Calcium: 9.5 mg/dL (ref 8.4–10.5)
Chloride: 103 mEq/L (ref 96–112)
Creat: 0.67 mg/dL (ref 0.50–1.10)
GFR, Est Non African American: >89 mL/min
GLUCOSE: 105 mg/dL — AB (ref 70–99)
Potassium: 4 mEq/L (ref 3.5–5.3)
Sodium: 140 mEq/L (ref 135–145)
TOTAL PROTEIN: 7.1 g/dL (ref 6.0–8.3)
Total Bilirubin: 0.4 mg/dL (ref 0.2–1.2)

## 2015-01-24 LAB — LIPID PANEL
Cholesterol: 173 mg/dL (ref 0–200)
HDL: 56 mg/dL (ref >=46)
LDL Cholesterol: 95 mg/dL (ref 0–99)
TRIGLYCERIDES: 111 mg/dL (ref <150)
Total CHOL/HDL Ratio: 3.1 Ratio
VLDL: 22 mg/dL (ref 0–40)

## 2015-01-25 ENCOUNTER — Encounter: Payer: Self-pay | Admitting: Family Medicine

## 2015-01-25 ENCOUNTER — Ambulatory Visit (INDEPENDENT_AMBULATORY_CARE_PROVIDER_SITE_OTHER): Payer: 59 | Admitting: Family Medicine

## 2015-01-25 VITALS — BP 120/80 | HR 78 | Resp 16 | Ht 69.0 in | Wt 212.0 lb

## 2015-01-25 DIAGNOSIS — E119 Type 2 diabetes mellitus without complications: Secondary | ICD-10-CM

## 2015-01-25 DIAGNOSIS — Z1159 Encounter for screening for other viral diseases: Secondary | ICD-10-CM

## 2015-01-25 DIAGNOSIS — F418 Other specified anxiety disorders: Secondary | ICD-10-CM

## 2015-01-25 DIAGNOSIS — IMO0001 Reserved for inherently not codable concepts without codable children: Secondary | ICD-10-CM

## 2015-01-25 DIAGNOSIS — K219 Gastro-esophageal reflux disease without esophagitis: Secondary | ICD-10-CM

## 2015-01-25 DIAGNOSIS — R809 Proteinuria, unspecified: Secondary | ICD-10-CM

## 2015-01-25 DIAGNOSIS — Z23 Encounter for immunization: Secondary | ICD-10-CM

## 2015-01-25 DIAGNOSIS — J302 Other seasonal allergic rhinitis: Secondary | ICD-10-CM

## 2015-01-25 DIAGNOSIS — I10 Essential (primary) hypertension: Secondary | ICD-10-CM

## 2015-01-25 DIAGNOSIS — F17220 Nicotine dependence, chewing tobacco, uncomplicated: Secondary | ICD-10-CM

## 2015-01-25 LAB — HEMOGLOBIN A1C
Hgb A1c MFr Bld: 6.3 % — ABNORMAL HIGH (ref <5.7)
MEAN PLASMA GLUCOSE: 134 mg/dL — AB (ref <117)

## 2015-01-25 MED ORDER — FLUOXETINE HCL 40 MG PO CAPS: 40.0000 mg | ORAL_CAPSULE | Freq: Every day | ORAL | Status: DC

## 2015-01-25 NOTE — Patient Instructions (Signed)
Welcome to medicare in 4.5 month  Labs and BP are excellent , no med change  PLS say goodbye to Snuff, this will  Help your health    Pls sign for eye exam  Prevnar today  HBA1C, chem 7 and EGFR , HIV, TSH, microalb

## 2015-01-25 NOTE — Progress Notes (Signed)
   Subjective:    Patient ID: Sophia [name], female    DOB: Apr 22, 1951, 64 y.o.   MRN: [id number]  HPI The PT is here for follow up and re-evaluation of chronic medical conditions, medication management and review of any available recent lab and radiology data.  Preventive health is updated, specifically  Cancer screening and Immunization.   Questions or concerns regarding consultations or procedures which the PT has had in the interim are  addressed. The PT denies any adverse reactions to current medications since the last visit.  There are no new concerns.  There are no specific complaints Denies polyuria, polydipsia, blurred vision , or hypoglycemic episodes.        Review of Systems See HPI Denies recent fever or chills. Denies sinus pressure, nasal congestion, ear pain or sore throat. Denies chest congestion, productive cough or wheezing. Denies chest pains, palpitations and leg swelling Denies abdominal pain, nausea, vomiting,diarrhea or constipation.   Denies dysuria, frequency, hesitancy or incontinence. Denies uncontrolled  joint pain, swelling and limitation in mobility. Denies headaches, seizures, numbness, or tingling. Denies uncontrolled  depression, anxiety or insomnia. Denies skin break down or rash.        Objective:   Physical Exam BP 120/80 mmHg  Pulse 78  Resp 16  Ht 5\' 9"  (1.753 m)  Wt 212 lb (96.163 kg)  BMI 31.29 kg/m2  SpO2 96% Patient alert and oriented and in no cardiopulmonary distress.  HEENT: No facial asymmetry, EOMI,   oropharynx pink and moist.  Neck supple no JVD, no mass.  Chest: Clear to auscultation bilaterally.  CVS: S1, S2 no murmurs, no S3.Regular rate.  ABD: Soft non tender.   Ext: No edema  MS: Adequate ROM spine, shoulders, hips and knees.  Skin: Intact, no ulcerations or rash noted.  Psych: Good eye contact, normal affect. Memory intact not anxious or depressed appearing.  CNS: CN 2-12 intact, power,  normal  throughout.no focal deficits noted.        Assessment & Plan:  Essential hypertension Controlled, no change in medication DASH diet and commitment to daily physical activity for a minimum of 30 minutes discussed and encouraged, as a part of hypertension management. The importance of attaining a healthy weight is also discussed.    Diabetes with proteinuria Controlled, no change in medication Patient advised to reduce carb and sweets, commit to regular physical activity, take meds as prescribed, test blood as directed, and attempt to lose weight, to improve blood sugar control.    Chewing tobacco nicotine dependence, uncomplicated unchanged Patient counseled for approximately 5 minutes regarding the health risks of ongoing nicotine use, specifically all types of cancer, heart disease, stroke and respiratory failure. The options available for help with cessation ,the behavioral changes to assist the process, and the option to either gradully reduce usage  Or abruptly stop.is also discussed. Pt is also encouraged to set specific goals in number of cigarettes used daily, as well as to set a quit date.    Seasonal allergies Controlled, no change in medication    GERD Controlled, no change in medication    Depression with anxiety Well controlled, no med change   Need for vaccination with 13-polyvalent pneumococcal conjugate vaccine After obtaining informed consent, the vaccine is  administered by LPN

## 2015-01-28 DIAGNOSIS — Z23 Encounter for immunization: Secondary | ICD-10-CM | POA: Insufficient documentation

## 2015-01-28 NOTE — Assessment & Plan Note (Signed)
Well controlled, no med change

## 2015-01-28 NOTE — Assessment & Plan Note (Signed)
Controlled, no change in medication  

## 2015-01-28 NOTE — Assessment & Plan Note (Signed)
unchanged Patient counseled for approximately 5 minutes regarding the health risks of ongoing nicotine use, specifically all types of cancer, heart disease, stroke and respiratory failure. The options available for help with cessation ,the behavioral changes to assist the process, and the option to either gradully reduce usage  Or abruptly stop.is also discussed. Pt is also encouraged to set specific goals in number of cigarettes used daily, as well as to set a quit date.  

## 2015-01-28 NOTE — Assessment & Plan Note (Signed)
Controlled, no change in medication Patient advised to reduce carb and sweets, commit to regular physical activity, take meds as prescribed, test blood as directed, and attempt to lose weight, to improve blood sugar control.  

## 2015-01-28 NOTE — Assessment & Plan Note (Signed)
Controlled, no change in medication DASH diet and commitment to daily physical activity for a minimum of 30 minutes discussed and encouraged, as a part of hypertension management. The importance of attaining a healthy weight is also discussed.  

## 2015-01-28 NOTE — Assessment & Plan Note (Signed)
After obtaining informed consent, the vaccine is  administered by LPN.  

## 2015-02-14 ENCOUNTER — Other Ambulatory Visit: Payer: Self-pay | Admitting: Family Medicine

## 2015-03-13 ENCOUNTER — Other Ambulatory Visit: Payer: Self-pay | Admitting: *Deleted

## 2015-03-13 ENCOUNTER — Encounter: Payer: Self-pay | Admitting: *Deleted

## 2015-03-13 ENCOUNTER — Other Ambulatory Visit: Payer: Self-pay | Admitting: Family Medicine

## 2015-03-13 VITALS — BP 102/60 | Ht 69.0 in | Wt 214.0 lb

## 2015-03-13 DIAGNOSIS — M25562 Pain in left knee: Secondary | ICD-10-CM

## 2015-03-13 DIAGNOSIS — Z1231 Encounter for screening mammogram for malignant neoplasm of breast: Secondary | ICD-10-CM

## 2015-03-14 ENCOUNTER — Telehealth: Payer: Self-pay | Admitting: Family Medicine

## 2015-03-14 ENCOUNTER — Ambulatory Visit (HOSPITAL_COMMUNITY)
Admission: RE | Admit: 2015-03-14 | Discharge: 2015-03-14 | Disposition: A | Discharge status: Home / Self Care | Payer: 59 | Source: Ambulatory Visit | Attending: Family Medicine | Admitting: Family Medicine

## 2015-03-14 DIAGNOSIS — M25562 Pain in left knee: Principal | ICD-10-CM

## 2015-03-14 DIAGNOSIS — I739 Peripheral vascular disease, unspecified: Secondary | ICD-10-CM | POA: Diagnosis not present

## 2015-03-14 DIAGNOSIS — M25561 Pain in right knee: Secondary | ICD-10-CM

## 2015-03-14 NOTE — Telephone Encounter (Signed)
Xray order.

## 2015-03-14 NOTE — Patient Outreach (Signed)
[facility]) Care Management   03/14/2015  Sophia [name] 06-19-1951 [id number]  Sophia [name] is an 64 y.o. female who presents for her final Link To Wellness visit for self management assistance with Type II DM.   Subjective:  State she will retire and her last work day is June 30th. Says she has enrolled in Medicare with a [employer] supplement. She is complaining of left knee pain and tenderness that started about 3 months ago. She says over the counter analgesic creams are no longer helping so she is taking Tylenol Arthritis twice daily. She says she has been putting off going to the doctor because she does not want to have surgery. She says the pain subsides when she sits or lays down.  Objective:   Walks with an obvious limp due to left knee pain but is not using an assistive device. ROS  Physical Exam  Constitutional: She is oriented to person, place, and time. She appears well-developed and well-nourished.  Musculoskeletal: She exhibits tenderness.       Left knee: Tenderness found.       Legs: Neurological: She is alert and oriented to person, place, and time.  Psychiatric: She has a normal mood and affect. Her behavior is normal. Judgment and thought content normal.   Filed Weights   03/13/15 1635  Weight: 214 lb (97.07 kg)   Filed Vitals:   03/13/15 1635  BP: 102/60   Current Medications:   Current Outpatient Prescriptions  Medication Sig Dispense Refill  . acetaminophen (TYLENOL) 500 MG tablet Take 1,000 mg by mouth every 6 (six) hours as needed for mild pain.    . benazepril-hydrochlorthiazide (LOTENSIN HCT) 20-12.5 MG per tablet TAKE 2 TABLETS BY MOUTH ONCE DAILY 180 tablet PRN  . calcium-vitamin D (OSCAL WITH D) 500-200 MG-UNIT per tablet Take 1 tablet by mouth daily with breakfast.    . CARTIA XT 180 MG 24 hr capsule TAKE 1 CAPSULE BY MOUTH ONCE A DAY 90 capsule PRN  . esomeprazole (NEXIUM) 40 MG capsule Take 1 capsule (40 mg total) by mouth daily  at 12 noon. 90 capsule 1  . FLUoxetine (PROZAC) 40 MG capsule Take 1 capsule (40 mg total) by mouth daily. 90 capsule 1  . JANUVIA 100 MG tablet TAKE 1 TABLET BY MOUTH DAILY 90 tablet PRN  . Multiple Vitamin (MULTIVITAMIN WITH MINERALS) TABS tablet Take 1 tablet by mouth daily.    . Omega-3 Fatty Acids (FISH OIL) 1000 MG CAPS Take 1 capsule by mouth daily.    . polyvinyl alcohol (LUBRICANT DROPS) 1.4 % ophthalmic solution Place 1 drop into both eyes as needed for dry eyes.    . pravastatin (PRAVACHOL) 40 MG tablet TAKE 1 TABLET BY MOUTH EVERY EVENING 90 tablet 1  . fluticasone (FLONASE) 50 MCG/ACT nasal spray Place 1 spray into both nostrils daily.     No current facility-administered medications for this visit.      Fall/Depression Screening:    PHQ 2/9 Scores 03/13/2015 09/27/2014 09/27/2014 04/28/2014 02/17/2014 12/10/2012  PHQ - 2 Score 0 0 6 0 4 2  PHQ- 9 Score - 0 22 - 12 2   [facility] CM Care Plan        Patient Outreach from 03/13/2015 in [place] Problem One  Type II DM with ongoing good glycemic control as evidenced by A1C= 6.3% on 01/24/15   Care Plan for Problem One  Active   Interventions for Problem One  Long Term Goal  Using a picture representation, reviewed the 8 core pathophysiologic deficits in Type II diabetes, discussed physiology of diabetes as a chronic progressive disease with the initial problem of insulin resistance in the muscle, liver and fat cells and then increased loss of beta cell function over time resulting in decreased insulin production, discussed role of obesity, especially central obesity, on insulin resistance,  reviewed patient medications, discussed DM medication of Januvia including the mechanism of action, common side effects, dosage and dosing schedule, reinforced importance of taking all medications as prescribed, advised Mahlia that she will be ineligible for participation in the Link To Wellness Program  when she retires on June 30th  since she will no longer have [employer], encouraged her to get a 3 month supply of her medications and testing supplies as close to June 30th as possible,   Using a picture representation, reviewed the 8 core pathophysiologic deficits in Type II diabetes, discussed physiology of diabetes as a chronic progressive disease with the initial problem of insulin resistance in the muscle, liver and fat cells and then increased loss of beta cell function over time resulting in decreased insulin production, discussed role of obesity, especially central obesity, on insulin resistance,  reviewed patient medications, discussed DM medication of Januvia including the mechanism of action, common side effects, dosage and dosing schedule, reinforced importance of taking all medications as prescribed, advised Areej that she will be ineligible for participation in the Link To Wellness Program  when she retires on June 30th since she will no longer have [employer], encouraged her to get a 3 month supply of her medications and testing supplies as close to June 30th as possible,  reviewed upcoming appointments for annual mammogram  and with primary care physician and reinforced the importance of keeping the appointments   [facility] Long Term Goal (31-90 days)  Continued good control of Type II DM as evidenced by meeting target A1C at each annual check   [facility] Long Term Goal Start Date  03/13/15   [facility] CM Short Term Goal #1 (0-30 days)  Acute left knee pain exacerbated by weight bearing status   [facility] CM Short Term Goal #1 Start Date  03/13/15     Assessment:   Link To Wellness patient with good control of Type II DM as evidenced by A1C= 6.3% on 01/24/15. Will retire on May 18, 2015 with health insurance coverage via Medicare so will no longer be eligible for the Link To Wellness program.  Plan:  RNCM to fax today's office visit note to Dr. Moshe Cipro. RNCM will disenroll patient from [facility] To Wellness program  on July 1st.  Barrington Ellison RN,CCM,CDE [place] Management Coordinator Office Phone [phone] Office Fax [phone]

## 2015-03-14 NOTE — Telephone Encounter (Signed)
xrays entered.

## 2015-03-15 ENCOUNTER — Telehealth: Payer: Self-pay

## 2015-03-15 DIAGNOSIS — M25561 Pain in right knee: Secondary | ICD-10-CM

## 2015-03-15 NOTE — Telephone Encounter (Signed)
Noted thanks °

## 2015-03-15 NOTE — Telephone Encounter (Signed)
Noted, thanks!

## 2015-03-15 NOTE — Telephone Encounter (Signed)
Referral entered for right knee pain

## 2015-03-28 ENCOUNTER — Encounter: Payer: Self-pay | Admitting: Orthopedic Surgery

## 2015-03-28 ENCOUNTER — Other Ambulatory Visit: Payer: Self-pay | Admitting: Family Medicine

## 2015-03-28 ENCOUNTER — Ambulatory Visit (INDEPENDENT_AMBULATORY_CARE_PROVIDER_SITE_OTHER): Payer: 59 | Admitting: Orthopedic Surgery

## 2015-03-28 VITALS — BP 140/81 | Ht 69.0 in | Wt 214.0 lb

## 2015-03-28 DIAGNOSIS — M170 Bilateral primary osteoarthritis of knee: Secondary | ICD-10-CM | POA: Diagnosis not present

## 2015-03-28 MED ORDER — IBUPROFEN 800 MG PO TABS: 800.0000 mg | ORAL_TABLET | Freq: Three times a day (TID) | ORAL | Status: DC

## 2015-03-28 MED ORDER — GABAPENTIN 100 MG PO CAPS: 100.0000 mg | ORAL_CAPSULE | Freq: Three times a day (TID) | ORAL | Status: DC

## 2015-03-28 NOTE — Patient Instructions (Addendum)
Two new meds sent to your pharmacy

## 2015-03-28 NOTE — Progress Notes (Signed)
Patient ID: Sophia [name], female   DOB: [birth year], 64 y.o.   MRN: [id number]  Chief Complaint  Patient presents with  . Knee Pain    left knee pain x 2 months, no known injury, ref DR Tomasa Rand is a 64 y.o. female.   HPI This is a 64 year old female with two-month history of what really amounts to left leg pain and a feeling of the leg is going to give way. The knee pain is actually diffuse and started with pain in her left lumbar area and hip which radiated down into her leg sometimes into the foot. Some of that has subsided although now the pain is localized to the knee and she rates it a 10. Unrelieved by Tylenol. Worse with walking. Review of Systems She reports vision problems denies block bowel or bladder dysfunction, no weight loss reported. No night sweats reported  Past Medical History  Diagnosis Date  . PONV (postoperative nausea and vomiting)   . Hypercholesteremia   . GERD (gastroesophageal reflux disease)   . Hypertension   . Anxiety   . Depression   . Diabetes mellitus without complication   . Arthritis     Past Surgical History  Procedure Laterality Date  . Breast surgery Left     benign tumor  . Abdominal hysterectomy    . Abdominal surgery      removal of tumors  . Cataract extraction w/phaco Right 01/11/2014    Procedure: CATARACT EXTRACTION PHACO AND INTRAOCULAR LENS PLACEMENT (IOC);  Surgeon: Elta Guadeloupe T. Gershon Crane, MD;  Location: [facility];  Service: Ophthalmology;  Laterality: Right;  CDE 5.57  . Cataract extraction w/phaco Left 01/25/2014    Procedure: CATARACT EXTRACTION PHACO AND INTRAOCULAR LENS PLACEMENT (IOC);  Surgeon: Elta Guadeloupe T. Gershon Crane, MD;  Location: [facility];  Service: Ophthalmology;  Laterality: Left;  CDE:5.19  . Upper gastrointestinal endoscopy  2007 CHEST PAIN    NL EXAM  . Colonoscopy  2007 [facility]    NL EXAM  . Colonoscopy N/A 04/22/2014    Dr. Fields:Normal mucosa in the terminal ileum/Two COLON polyps REMOVED/ Mild diverticulosis in  the ascending colon and transverse colon/The LEFT colon IS redundant/Small internal hemorrhoids. Path: tubular adenoma. Next colonoscopy in 5-10 years  . Esophagogastroduodenoscopy N/A 04/22/2014    Dr. Fields:MICROCYTIC ANEMIA MOST LIKELY DUE TO ASA/VOLTAREN/Small hiatal hernia/MODERATE Non-erosive gastritis. Negative H.pylori  . Yag laser application Right 6/46/8032    Procedure: YAG LASER APPLICATION;  Surgeon: Elta Guadeloupe T. Gershon Crane, MD;  Location: [facility];  Service: Ophthalmology;  Laterality: Right;    Family History  Problem Relation Age of Onset  . Colon polyps Sister 68  . Colon cancer Neg Hx     Social History History  Substance Use Topics  . Smoking status: Former Smoker -- 0.50 packs/day for 20 years    Types: Cigarettes    Quit date: 01/05/1990  . Smokeless tobacco: Current User    Types: Snuff  . Alcohol Use: No    No Known Allergies  Current Outpatient Prescriptions  Medication Sig Dispense Refill  . acetaminophen (TYLENOL) 500 MG tablet Take 1,000 mg by mouth every 6 (six) hours as needed for mild pain.    . benazepril-hydrochlorthiazide (LOTENSIN HCT) 20-12.5 MG per tablet TAKE 2 TABLETS BY MOUTH ONCE DAILY 180 tablet PRN  . calcium-vitamin D (OSCAL WITH D) 500-200 MG-UNIT per tablet Take 1 tablet by mouth daily with breakfast.    . CARTIA XT 180 MG 24 hr capsule  TAKE 1 CAPSULE BY MOUTH ONCE A DAY 90 capsule PRN  . esomeprazole (NEXIUM) 40 MG capsule Take 1 capsule (40 mg total) by mouth daily at 12 noon. 90 capsule 1  . FLUoxetine (PROZAC) 40 MG capsule Take 1 capsule (40 mg total) by mouth daily. 90 capsule 1  . fluticasone (FLONASE) 50 MCG/ACT nasal spray Place 1 spray into both nostrils daily.    Marland Kitchen JANUVIA 100 MG tablet TAKE 1 TABLET BY MOUTH DAILY 90 tablet PRN  . Multiple Vitamin (MULTIVITAMIN WITH MINERALS) TABS tablet Take 1 tablet by mouth daily.    . Omega-3 Fatty Acids (FISH OIL) 1000 MG CAPS Take 1 capsule by mouth daily.    . polyvinyl alcohol (LUBRICANT  DROPS) 1.4 % ophthalmic solution Place 1 drop into both eyes as needed for dry eyes.    . pravastatin (PRAVACHOL) 40 MG tablet TAKE 1 TABLET BY MOUTH EVERY EVENING 90 tablet 1  . gabapentin (NEURONTIN) 100 MG capsule Take 1 capsule (100 mg total) by mouth 3 (three) times daily. 90 capsule 0  . ibuprofen (ADVIL,MOTRIN) 800 MG tablet Take 1 tablet (800 mg total) by mouth 3 (three) times daily. 90 tablet 0   No current facility-administered medications for this visit.       Physical Exam Blood pressure 140/81, height 5\' 9"  (1.753 m), weight 214 lb (97.07 kg). Physical Exam The patient is well developed well nourished and well groomed. Orientation to person place and time is normal  Mood is pleasant. Ambulatory status normal Examination the left knee reveals no effusion no tenderness full passive range of motion all ligaments were stable motor exam is normal  Lumbar spine exam showed tenderness in the lumbar spine L2-L5 tenderness in the left SI joint left buttock lateral leg and lateral thigh. Reflexes are 1-2+ at the knee and equal 0-1+ at the ankle and equal 2+ dorsalis pedis pulses bilaterally and no numbness  Straight leg raise positive on the left at 45 and reproduces symptoms. Also opposite right leg straight leg raise positive at 80.  Data Reviewed The patient initially had complained of knee pain so bilateral knee films showed mild degenerative changes  Assessment Her symptoms match her physical exam and apparently she has a irritated L5 nerve Plan Avoid steroids if possible secondary to diabetes, start gabapentin 100 mg 3 times a day and ibuprofen 800 twice a day she has Nexium which should protect her from a GI standpoint. All 4 weeks if no improvement in her symptoms recommend x-rays of her lumbar spine

## 2015-03-30 ENCOUNTER — Encounter: Payer: Self-pay | Admitting: *Deleted

## 2015-04-03 ENCOUNTER — Ambulatory Visit (HOSPITAL_COMMUNITY)
Admission: RE | Admit: 2015-04-03 | Discharge: 2015-04-03 | Disposition: A | Discharge status: Home / Self Care | Payer: 59 | Source: Ambulatory Visit | Attending: Family Medicine | Admitting: Family Medicine

## 2015-04-03 DIAGNOSIS — Z1231 Encounter for screening mammogram for malignant neoplasm of breast: Secondary | ICD-10-CM | POA: Diagnosis present

## 2015-04-25 ENCOUNTER — Ambulatory Visit (INDEPENDENT_AMBULATORY_CARE_PROVIDER_SITE_OTHER): Payer: 59 | Admitting: Orthopedic Surgery

## 2015-04-25 ENCOUNTER — Encounter: Payer: Self-pay | Admitting: Orthopedic Surgery

## 2015-04-25 VITALS — BP 131/74 | Ht 69.0 in | Wt 214.0 lb

## 2015-04-25 DIAGNOSIS — M549 Dorsalgia, unspecified: Secondary | ICD-10-CM | POA: Diagnosis not present

## 2015-04-25 MED ORDER — GABAPENTIN 100 MG PO CAPS: 100.0000 mg | ORAL_CAPSULE | Freq: Three times a day (TID) | ORAL | Status: DC

## 2015-04-25 NOTE — Progress Notes (Signed)
Patient ID: Sophia [name], female   DOB: 1951-10-22, 64 y.o.   MRN: [id number] Chief Complaint  Patient presents with  . Follow-up    4 week recheck on knee and back painafter medication.    Encounter Diagnosis  Name Primary?  . Back pain with radiation Yes   secondary diagnosis osteoarthritis bilateral knee stable   She comes in after injection of her knee for arthritis we also diagnosis of irritated L5 nerve root treated with gabapentin 100 mg 3 times a day and ibuprofen 800 twice a day and she did very well. She says her pain has decreased to almost nothing she still has some symptoms in her knees but that is mild. She's doing well she looks great she is walking well she has mild tenderness in her lower back nothing significant has excellent strength in her legs  Pertinent system review no bowel or bladder problems at this time  Follow-up in October for me to reevaluate her knees. We already have x-rays. Gabapentin

## 2015-06-16 ENCOUNTER — Other Ambulatory Visit (HOSPITAL_COMMUNITY): Admission: RE | Admit: 2015-06-16 | Payer: 59 | Source: Ambulatory Visit

## 2015-06-19 ENCOUNTER — Other Ambulatory Visit: Payer: Self-pay

## 2015-06-19 DIAGNOSIS — I10 Essential (primary) hypertension: Secondary | ICD-10-CM | POA: Diagnosis not present

## 2015-06-19 DIAGNOSIS — Z1159 Encounter for screening for other viral diseases: Secondary | ICD-10-CM | POA: Diagnosis not present

## 2015-06-19 DIAGNOSIS — R809 Proteinuria, unspecified: Secondary | ICD-10-CM | POA: Diagnosis not present

## 2015-06-19 DIAGNOSIS — E119 Type 2 diabetes mellitus without complications: Secondary | ICD-10-CM | POA: Diagnosis not present

## 2015-06-19 MED ORDER — SITAGLIPTIN PHOSPHATE 100 MG PO TABS: 100.0000 mg | ORAL_TABLET | Freq: Every day | ORAL | Status: DC

## 2015-06-19 MED ORDER — DILTIAZEM HCL ER COATED BEADS 180 MG PO CP24: 180.0000 mg | ORAL_CAPSULE | Freq: Every day | ORAL | Status: DC

## 2015-06-19 MED ORDER — BENAZEPRIL-HYDROCHLOROTHIAZIDE 20-12.5 MG PO TABS: 2.0000 | ORAL_TABLET | Freq: Every day | ORAL | Status: DC

## 2015-06-19 MED ORDER — FLUOXETINE HCL 40 MG PO CAPS: 40.0000 mg | ORAL_CAPSULE | Freq: Every day | ORAL | Status: DC

## 2015-06-20 LAB — COMPLETE METABOLIC PANEL WITH GFR
ALBUMIN: 4.4 g/dL (ref 3.6–5.1)
ALT: 29 U/L (ref 6–29)
AST: 17 U/L (ref 10–35)
Alkaline Phosphatase: 62 U/L (ref 33–130)
BUN: 16 mg/dL (ref 7–25)
CO2: 24 mmol/L (ref 20–31)
CREATININE: 0.64 mg/dL (ref 0.50–0.99)
Calcium: 9.5 mg/dL (ref 8.6–10.4)
Chloride: 104 mmol/L (ref 98–110)
Glucose, Bld: 97 mg/dL (ref 65–99)
Potassium: 4.1 mmol/L (ref 3.5–5.3)
Sodium: 143 mmol/L (ref 135–146)
Total Bilirubin: 0.3 mg/dL (ref 0.2–1.2)
Total Protein: 7.1 g/dL (ref 6.1–8.1)

## 2015-06-20 LAB — BASIC METABOLIC PANEL
BUN: 16 mg/dL (ref 7–25)
CO2: 24 mmol/L (ref 20–31)
CREATININE: 0.64 mg/dL (ref 0.50–0.99)
Calcium: 9.5 mg/dL (ref 8.6–10.4)
Chloride: 104 mmol/L (ref 98–110)
Glucose, Bld: 97 mg/dL (ref 65–99)
Potassium: 4.1 mmol/L (ref 3.5–5.3)
SODIUM: 143 mmol/L (ref 135–146)

## 2015-06-20 LAB — HIV ANTIBODY (ROUTINE TESTING W REFLEX): HIV: NONREACTIVE

## 2015-06-20 LAB — MICROALBUMIN / CREATININE URINE RATIO
Creatinine, Urine: 163.7 mg/dL
MICROALB/CREAT RATIO: 41.5 mg/g — AB (ref 0.0–30.0)
Microalb, Ur: 6.8 mg/dL — ABNORMAL HIGH (ref <2.0)

## 2015-06-20 LAB — TSH: TSH: 1.444 u[IU]/mL (ref 0.350–4.500)

## 2015-06-27 ENCOUNTER — Encounter: Payer: Self-pay | Admitting: Family Medicine

## 2015-06-27 ENCOUNTER — Ambulatory Visit (INDEPENDENT_AMBULATORY_CARE_PROVIDER_SITE_OTHER): Payer: Medicare PPO | Admitting: Family Medicine

## 2015-06-27 VITALS — BP 144/84 | HR 73 | Resp 16 | Ht 67.0 in | Wt 216.0 lb

## 2015-06-27 DIAGNOSIS — E119 Type 2 diabetes mellitus without complications: Secondary | ICD-10-CM

## 2015-06-27 DIAGNOSIS — N3001 Acute cystitis with hematuria: Secondary | ICD-10-CM

## 2015-06-27 DIAGNOSIS — B351 Tinea unguium: Secondary | ICD-10-CM

## 2015-06-27 DIAGNOSIS — F17210 Nicotine dependence, cigarettes, uncomplicated: Secondary | ICD-10-CM

## 2015-06-27 DIAGNOSIS — N3000 Acute cystitis without hematuria: Secondary | ICD-10-CM | POA: Diagnosis not present

## 2015-06-27 DIAGNOSIS — Z139 Encounter for screening, unspecified: Secondary | ICD-10-CM

## 2015-06-27 DIAGNOSIS — IMO0001 Reserved for inherently not codable concepts without codable children: Secondary | ICD-10-CM

## 2015-06-27 DIAGNOSIS — E785 Hyperlipidemia, unspecified: Secondary | ICD-10-CM

## 2015-06-27 DIAGNOSIS — Z1159 Encounter for screening for other viral diseases: Secondary | ICD-10-CM

## 2015-06-27 DIAGNOSIS — R809 Proteinuria, unspecified: Secondary | ICD-10-CM

## 2015-06-27 DIAGNOSIS — Z0000 Encounter for general adult medical examination without abnormal findings: Secondary | ICD-10-CM

## 2015-06-27 DIAGNOSIS — Z13820 Encounter for screening for osteoporosis: Secondary | ICD-10-CM

## 2015-06-27 DIAGNOSIS — Z136 Encounter for screening for cardiovascular disorders: Secondary | ICD-10-CM

## 2015-06-27 DIAGNOSIS — F418 Other specified anxiety disorders: Secondary | ICD-10-CM

## 2015-06-27 DIAGNOSIS — I10 Essential (primary) hypertension: Secondary | ICD-10-CM

## 2015-06-27 LAB — POCT URINALYSIS DIPSTICK
BILIRUBIN UA: NEGATIVE
Glucose, UA: NEGATIVE
Ketones, UA: NEGATIVE
Nitrite, UA: NEGATIVE
Protein, UA: 30
Spec Grav, UA: 1.025
UROBILINOGEN UA: 0.2
pH, UA: 6.5

## 2015-06-27 MED ORDER — BUSPIRONE HCL 5 MG PO TABS: 5.0000 mg | ORAL_TABLET | Freq: Two times a day (BID) | ORAL | Status: DC

## 2015-06-27 MED ORDER — TERBINAFINE HCL 250 MG PO TABS: 250.0000 mg | ORAL_TABLET | Freq: Every day | ORAL | Status: DC

## 2015-06-27 NOTE — Progress Notes (Signed)
Subjective:    Patient ID: Sophia [name], female    DOB: 10/29/1951, 64 y.o.   MRN: [id number]  HPI  Preventive Screening-Counseling & Management   Patient present here today for a welcome to  Medicare  wellness visit. Diabetic foot exam is due and is done  Current Problems (verified)   Medications Prior to Visit Allergies (verified)   PAST HISTORY  Family History (verified)   Social History  Married for 12 years, 2 grown children, Retired from [employer] but still works there twice a week    Risk Factors  Current exercise habits:  Going to start back going to the gym. States too hot to walk outside   Dietary issues discussed: Eating more fruits and vegetables, esp grapes. Encouraged to limit carbs and fried fatty foods    Cardiac risk factors: DM Type 2   Depression Screen  (Note: if answer to either of the following is "Yes", a more complete depression screening is indicated)   Over the past two weeks, have you felt down, depressed or hopeless? No  Over the past two weeks, have you felt little interest or pleasure in doing things? No  Have you lost interest or pleasure in daily life? No  Do you often feel hopeless? No  Do you cry easily over simple problems? No   Activities of Daily Living  In your present state of health, do you have any difficulty performing the following activities?  Driving?: No Managing money?: No Feeding yourself?:No Getting from bed to chair?:No Climbing a flight of stairs?:No Preparing food and eating?:No Bathing or showering?:No Getting dressed?:No Getting to the toilet?:No Using the toilet?:No Moving around from place to place?: No  Fall Risk Assessment In the past year have you fallen or had a near fall?:No Are you currently taking any medications that make you dizziness?:No   Hearing Difficulties: No Do you often ask people to speak up or repeat themselves?:No Do you experience ringing or noises in your ears?:No Do you  have difficulty understanding soft or whispered voices?:No  Cognitive Testing  Alert? Yes Normal Appearance?Yes  Oriented to person? Yes Place? Yes  Time? Yes  Displays appropriate judgment?Yes  Can read the correct time from a watch face? yes Are you having problems remembering things?No  Advanced Directives have been discussed with the patient? No living will. Will give brochure, full code    List the Names of Other Physician/Practitioners you currently use:  Dr Gershon Crane (opth) Dr Aline Brochure (ortho) Indicate any recent Medical Services you may have received from other than [facility] providers in the past year (date may be approximate).   Assessment:    Annual Wellness Exam   Plan:    Medicare Attestation  I have personally reviewed:  The patient's medical and social history  Their use of alcohol, tobacco or illicit drugs  Their current medications and supplements  The patient's functional ability including ADLs,fall risks, home safety risks, cognitive, and hearing and visual impairment  Diet and physical activities  Evidence for depression or mood disorders  The patient's weight, height, BMI, and visual acuity have been recorded in the chart. I have made referrals, counseling, and provided education to the patient based on review of the above and I have provided the patient with a written personalized care plan for preventive services.     Review of Systems     Objective:   Physical Exam  BP 144/84 mmHg  Pulse 73  Resp 16  Ht 5\' 7"  (  1.702 m)  Wt 216 lb (97.977 kg)  BMI 33.82 kg/m2  SpO2 95%       Assessment & Plan:  Welcome to Medicare preventive visit Annual exam as documented. Counseling done  re healthy lifestyle involving commitment to 150 minutes exercise per week, heart healthy diet, and attaining healthy weight.The importance of adequate sleep also discussed. Regular seat belt use and home safety, is also discussed. Changes in health habits are decided on  by the patient with goals and time frames  set for achieving them. Immunization and cancer screening needs are specifically addressed at this visit.   Onychomycosis Antifungal oral for 3 months prescribed  Depression with anxiety Uncontrolled anxiety, feels as though she depends on nicotine to control sm[ptoms, will start buspar  Essential hypertension Elevated and unconrolled at visit No med change at thsi time, on multipkle meds DASH diet and commitment to daily physical activity for a minimum of 30 minutes discussed and encouraged, as a part of hypertension management. The importance of attaining a healthy weight is also discussed.  BP/Weight 06/27/2015 04/25/2015 03/28/2015 03/13/2015 01/25/2015 09/27/2014 46/96/2952  Systolic BP 841 324 401 027 [phone]  Diastolic BP 84 74 81 60 80 70 68  Wt. (Lbs) 216 214 214 214 212 208 -  BMI 33.82 31.59 31.59 31.59 31.29 30.7 -

## 2015-06-27 NOTE — Patient Instructions (Addendum)
Annual physical  in 4 months, call if you need me before  Fasting labs 2nd week in Sept, 18 or after  Terbinafine one daily fo 3 months for fungal toenail infection  Buspar one twice daily for anxiety   You are referred fro eye exam and for bone density test, will get back to you re US abdomen to screen for AAA  It is important that you exercise regularly at least 30 minutes 5 times a week. If you develop chest pain, have severe difficulty breathing, or feel very tired, stop exercising immediately and seek medical attention   You DO nEED to quit snuff  Urine is sent to check for infection  BP slightly Higher than desired, work on exercise , weight loss and quitting nicotine, increase veghetables  Thanks for choosing [facility], we consider it a privelige to serve you.

## 2015-06-29 LAB — URINE CULTURE
Colony Count: NO GROWTH
Organism ID, Bacteria: NO GROWTH

## 2015-07-01 DIAGNOSIS — Z0000 Encounter for general adult medical examination without abnormal findings: Secondary | ICD-10-CM | POA: Insufficient documentation

## 2015-07-01 NOTE — Assessment & Plan Note (Signed)
Elevated and unconrolled at visit No med change at thsi time, on multipkle meds DASH diet and commitment to daily physical activity for a minimum of 30 minutes discussed and encouraged, as a part of hypertension management. The importance of attaining a healthy weight is also discussed.  BP/Weight 06/27/2015 04/25/2015 03/28/2015 03/13/2015 01/25/2015 09/27/2014 22/29/7989  Systolic BP 211 941 740 814 [phone]  Diastolic BP 84 74 81 60 80 70 68  Wt. (Lbs) 216 214 214 214 212 208 -  BMI 33.82 31.59 31.59 31.59 31.29 30.7 -

## 2015-07-01 NOTE — Assessment & Plan Note (Signed)

## 2015-07-01 NOTE — Assessment & Plan Note (Signed)
Antifungal oral for 3 months prescribed

## 2015-07-01 NOTE — Assessment & Plan Note (Signed)
Uncontrolled anxiety, feels as though she depends on nicotine to control sm[ptoms, will start buspar

## 2015-07-05 ENCOUNTER — Other Ambulatory Visit: Payer: Self-pay | Admitting: *Deleted

## 2015-07-05 NOTE — Patient Outreach (Addendum)
Spoke with Sophia [name] by home phone to verify that she is no longer eligible for the [employer] [facility] program as she has retired from [employer] , says she is working prn, but no longer has [employer]. Says she has [employer] and Medicaid.  Will disenroll Aerin from the [employer] [facility] [employer] DM program. Barrington Ellison RN,CCM,CDE [place] Management Coordinator [facility] Office Phone [phone] Office Fax [phone]

## 2015-07-06 ENCOUNTER — Ambulatory Visit (HOSPITAL_COMMUNITY)
Admission: RE | Admit: 2015-07-06 | Discharge: 2015-07-06 | Disposition: A | Discharge status: Home / Self Care | Payer: Medicare PPO | Source: Ambulatory Visit | Attending: Family Medicine | Admitting: Family Medicine

## 2015-07-06 DIAGNOSIS — Z136 Encounter for screening for cardiovascular disorders: Secondary | ICD-10-CM | POA: Diagnosis not present

## 2015-07-06 DIAGNOSIS — F17200 Nicotine dependence, unspecified, uncomplicated: Secondary | ICD-10-CM | POA: Insufficient documentation

## 2015-07-06 DIAGNOSIS — E119 Type 2 diabetes mellitus without complications: Secondary | ICD-10-CM | POA: Insufficient documentation

## 2015-07-06 DIAGNOSIS — E785 Hyperlipidemia, unspecified: Secondary | ICD-10-CM | POA: Insufficient documentation

## 2015-07-06 DIAGNOSIS — Z1389 Encounter for screening for other disorder: Secondary | ICD-10-CM | POA: Diagnosis not present

## 2015-07-06 DIAGNOSIS — I10 Essential (primary) hypertension: Secondary | ICD-10-CM | POA: Insufficient documentation

## 2015-07-07 ENCOUNTER — Other Ambulatory Visit: Payer: Self-pay

## 2015-07-07 MED ORDER — FLUOXETINE HCL 40 MG PO CAPS: 40.0000 mg | ORAL_CAPSULE | Freq: Every day | ORAL | Status: DC

## 2015-07-07 MED ORDER — SITAGLIPTIN PHOSPHATE 100 MG PO TABS: 100.0000 mg | ORAL_TABLET | Freq: Every day | ORAL | Status: DC

## 2015-07-07 MED ORDER — BENAZEPRIL-HYDROCHLOROTHIAZIDE 20-12.5 MG PO TABS: 2.0000 | ORAL_TABLET | Freq: Every day | ORAL | Status: DC

## 2015-07-07 MED ORDER — GABAPENTIN 100 MG PO CAPS: 100.0000 mg | ORAL_CAPSULE | Freq: Three times a day (TID) | ORAL | Status: DC

## 2015-07-07 MED ORDER — PRAVASTATIN SODIUM 40 MG PO TABS: 40.0000 mg | ORAL_TABLET | Freq: Every evening | ORAL | Status: DC

## 2015-07-07 MED ORDER — ESOMEPRAZOLE MAGNESIUM 40 MG PO CPDR: DELAYED_RELEASE_CAPSULE | ORAL | Status: DC

## 2015-07-07 MED ORDER — DILTIAZEM HCL ER COATED BEADS 180 MG PO CP24: 180.0000 mg | ORAL_CAPSULE | Freq: Every day | ORAL | Status: DC

## 2015-07-07 MED ORDER — TERBINAFINE HCL 250 MG PO TABS: 250.0000 mg | ORAL_TABLET | Freq: Every day | ORAL | Status: DC

## 2015-07-07 MED ORDER — BUSPIRONE HCL 5 MG PO TABS: 5.0000 mg | ORAL_TABLET | Freq: Two times a day (BID) | ORAL | Status: DC

## 2015-07-13 ENCOUNTER — Telehealth: Payer: Self-pay | Admitting: *Deleted

## 2015-07-13 DIAGNOSIS — IMO0001 Reserved for inherently not codable concepts without codable children: Secondary | ICD-10-CM

## 2015-07-13 NOTE — Telephone Encounter (Signed)
Pt called requesting to speak with Loma Sousa, pt wants Loma Sousa to call her back

## 2015-07-13 NOTE — Telephone Encounter (Signed)
Her Januvia is over $100 and they said she needs something else (generic) called in similar to that. Please advise. Wants it sent to mail order.

## 2015-07-13 NOTE — Telephone Encounter (Signed)
Needs updated HBA1C, chem 7 and EGFR, may be able to be on metformin only, no allergy is documented

## 2015-07-14 DIAGNOSIS — E119 Type 2 diabetes mellitus without complications: Secondary | ICD-10-CM | POA: Diagnosis not present

## 2015-07-14 DIAGNOSIS — R809 Proteinuria, unspecified: Secondary | ICD-10-CM | POA: Diagnosis not present

## 2015-07-14 LAB — BASIC METABOLIC PANEL WITH GFR
BUN: 21 mg/dL (ref 7–25)
CO2: 27 mmol/L (ref 20–31)
Calcium: 9.6 mg/dL (ref 8.6–10.4)
Chloride: 102 mmol/L (ref 98–110)
Creat: 0.78 mg/dL (ref 0.50–0.99)
GFR, Est Non African American: 80 mL/min (ref >=60)
Glucose, Bld: 88 mg/dL (ref 65–99)
Potassium: 4.1 mmol/L (ref 3.5–5.3)
Sodium: 140 mmol/L (ref 135–146)

## 2015-07-14 LAB — HEMOGLOBIN A1C
HEMOGLOBIN A1C: 6.4 % — AB (ref <5.7)
MEAN PLASMA GLUCOSE: 137 mg/dL — AB (ref <117)

## 2015-07-14 NOTE — Telephone Encounter (Signed)
Will go have updated labs

## 2015-07-14 NOTE — Addendum Note (Signed)
Addended by: Eual Fines on: 07/14/2015 08:52 AM   Modules accepted: Orders

## 2015-07-18 ENCOUNTER — Other Ambulatory Visit: Payer: Self-pay

## 2015-07-18 ENCOUNTER — Telehealth: Payer: Self-pay

## 2015-07-18 MED ORDER — ESOMEPRAZOLE MAGNESIUM 40 MG PO CPDR: DELAYED_RELEASE_CAPSULE | ORAL | Status: DC

## 2015-07-18 NOTE — Telephone Encounter (Signed)
Once you check, let me know the options pls

## 2015-07-19 MED ORDER — PANTOPRAZOLE SODIUM 40 MG PO TBEC: 40.0000 mg | DELAYED_RELEASE_TABLET | Freq: Every day | ORAL | Status: DC

## 2015-07-19 MED ORDER — METFORMIN HCL 500 MG PO TABS: 500.0000 mg | ORAL_TABLET | Freq: Every day | ORAL | Status: DC

## 2015-07-19 NOTE — Addendum Note (Signed)
Addended by: Denman George B on: 07/19/2015 11:08 AM   Modules accepted: Orders

## 2015-07-19 NOTE — Addendum Note (Signed)
Addended by: Denman George B on: 07/19/2015 09:54 AM   Modules accepted: Orders, Medications

## 2015-07-19 NOTE — Telephone Encounter (Signed)
Medication sent to pharmacy.  Called and left message for patient to return call.

## 2015-07-19 NOTE — Telephone Encounter (Signed)
prtoniox 40 mg daily , yes pls send

## 2015-07-19 NOTE — Telephone Encounter (Signed)
Patient aware.

## 2015-07-19 NOTE — Telephone Encounter (Signed)
Would you like to prescribe Pantoprazole?

## 2015-08-01 DIAGNOSIS — Z961 Presence of intraocular lens: Secondary | ICD-10-CM | POA: Diagnosis not present

## 2015-08-01 DIAGNOSIS — E119 Type 2 diabetes mellitus without complications: Secondary | ICD-10-CM | POA: Diagnosis not present

## 2015-08-01 DIAGNOSIS — H26492 Other secondary cataract, left eye: Secondary | ICD-10-CM | POA: Diagnosis not present

## 2015-08-01 LAB — HM DIABETES EYE EXAM

## 2015-08-15 ENCOUNTER — Encounter (HOSPITAL_COMMUNITY)
Admission: RE | Disposition: A | Discharge status: Home / Self Care | Payer: Self-pay | Source: Ambulatory Visit | Attending: Ophthalmology

## 2015-08-15 ENCOUNTER — Ambulatory Visit (HOSPITAL_COMMUNITY)
Admission: RE | Admit: 2015-08-15 | Discharge: 2015-08-15 | Disposition: A | Discharge status: Home / Self Care | Payer: Medicare PPO | Source: Ambulatory Visit | Attending: Ophthalmology | Admitting: Ophthalmology

## 2015-08-15 DIAGNOSIS — E119 Type 2 diabetes mellitus without complications: Secondary | ICD-10-CM | POA: Insufficient documentation

## 2015-08-15 DIAGNOSIS — I10 Essential (primary) hypertension: Secondary | ICD-10-CM | POA: Diagnosis not present

## 2015-08-15 DIAGNOSIS — H26492 Other secondary cataract, left eye: Secondary | ICD-10-CM | POA: Diagnosis not present

## 2015-08-15 DIAGNOSIS — Z79899 Other long term (current) drug therapy: Secondary | ICD-10-CM | POA: Insufficient documentation

## 2015-08-15 DIAGNOSIS — E780 Pure hypercholesterolemia: Secondary | ICD-10-CM | POA: Diagnosis not present

## 2015-08-15 HISTORY — PX: YAG LASER APPLICATION: SHX6189

## 2015-08-15 SURGERY — TREATMENT, USING YAG LASER
Anesthesia: LOCAL | Laterality: Left

## 2015-08-15 MED ORDER — TETRACAINE HCL 0.5 % OP SOLN
OPHTHALMIC | Status: AC
  Filled 2015-08-15: qty 2

## 2015-08-15 MED ORDER — TROPICAMIDE 1 % OP SOLN
1.0000 [drp] | OPHTHALMIC | Status: AC
  Administered 2015-08-15 (×2): 1 [drp] via OPHTHALMIC

## 2015-08-15 MED ORDER — TROPICAMIDE 1 % OP SOLN
OPHTHALMIC | Status: AC
  Filled 2015-08-15: qty 3

## 2015-08-15 MED ORDER — TETRACAINE HCL 0.5 % OP SOLN: 1.0000 [drp] | OPHTHALMIC | Status: DC

## 2015-08-15 NOTE — Op Note (Signed)
Sophia [name] T. Gershon Crane, MD  Procedure: Yag Capsulotomy  Yag Laser Self Test Completedyes. Procedure: Posterior Capsulotomy, Eye Protection Worn by Staff yes. Laser In Use Sign on Door yes.  Laser: Nd:YAG Spot Size: Fixed Burst Mode: III Power Setting: 3.4 mJ/burst Number of shots: 10 Total energy delivered: 31.52 mJ   The patient tolerated the procedure without difficulty. No complications were encountered.   The patient was discharged home with the instructions to continue all her current glaucoma medications, if any.   Patient instructed to go to office at 0100 for intraocular pressure check.  Patient verbalizes understanding of discharge instructions Yes.  .    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

## 2015-08-15 NOTE — H&P (Signed)
The patient was re examined and there is no change in the patients condition since the original H and P. 

## 2015-08-15 NOTE — Discharge Instructions (Signed)
[facility]  08/15/2015     Instructions    Activity: No Restrictions.   Diet: Resume Diet you were on at home.   Pain Medication: Tylenol if Needed.   CONTACT YOUR DOCTOR IF YOU HAVE PAIN, REDNESS IN YOUR EYE, OR DECREASED VISION.   Follow-up:today with Rutherford Guys, MD.   Dr. Gershon Crane: [phone]  Dr. Iona Hansen: [phone]  Dr. Geoffry Paradise: [phone]   If you find that you cannot contact your physician, but feel that your signs and   Symptoms warrant a physician's attention, call the Emergency Room at   [phone] ext.[phone].   Othern/a   FOLLOW UP TODAY IN DR SHAPIRO'S OFFICE BETWEEN 1-2 PM

## 2015-08-16 ENCOUNTER — Encounter (HOSPITAL_COMMUNITY): Payer: Self-pay | Admitting: Ophthalmology

## 2015-08-29 ENCOUNTER — Ambulatory Visit: Payer: 59 | Admitting: Orthopedic Surgery

## 2015-09-05 ENCOUNTER — Other Ambulatory Visit: Payer: Self-pay

## 2015-09-05 MED ORDER — METFORMIN HCL 500 MG PO TABS: 500.0000 mg | ORAL_TABLET | Freq: Every day | ORAL | Status: DC

## 2015-09-05 MED ORDER — FLUOXETINE HCL 40 MG PO CAPS: 40.0000 mg | ORAL_CAPSULE | Freq: Every day | ORAL | Status: DC

## 2015-09-05 MED ORDER — DILTIAZEM HCL ER COATED BEADS 180 MG PO CP24: 180.0000 mg | ORAL_CAPSULE | Freq: Every day | ORAL | Status: DC

## 2015-09-05 MED ORDER — PRAVASTATIN SODIUM 40 MG PO TABS: 40.0000 mg | ORAL_TABLET | Freq: Every evening | ORAL | Status: DC

## 2015-09-05 MED ORDER — GABAPENTIN 100 MG PO CAPS: 100.0000 mg | ORAL_CAPSULE | Freq: Three times a day (TID) | ORAL | Status: DC

## 2015-09-05 MED ORDER — BUSPIRONE HCL 5 MG PO TABS: 5.0000 mg | ORAL_TABLET | Freq: Two times a day (BID) | ORAL | Status: DC

## 2015-09-05 MED ORDER — BENAZEPRIL-HYDROCHLOROTHIAZIDE 20-12.5 MG PO TABS: 2.0000 | ORAL_TABLET | Freq: Every day | ORAL | Status: DC

## 2015-09-05 MED ORDER — PANTOPRAZOLE SODIUM 40 MG PO TBEC: 40.0000 mg | DELAYED_RELEASE_TABLET | Freq: Every day | ORAL | Status: DC

## 2015-11-07 ENCOUNTER — Other Ambulatory Visit: Payer: Self-pay | Admitting: Family Medicine

## 2015-11-07 DIAGNOSIS — R809 Proteinuria, unspecified: Secondary | ICD-10-CM | POA: Diagnosis not present

## 2015-11-07 DIAGNOSIS — E785 Hyperlipidemia, unspecified: Secondary | ICD-10-CM | POA: Diagnosis not present

## 2015-11-07 DIAGNOSIS — Z1159 Encounter for screening for other viral diseases: Secondary | ICD-10-CM | POA: Diagnosis not present

## 2015-11-07 DIAGNOSIS — E559 Vitamin D deficiency, unspecified: Secondary | ICD-10-CM | POA: Diagnosis not present

## 2015-11-07 DIAGNOSIS — E119 Type 2 diabetes mellitus without complications: Secondary | ICD-10-CM | POA: Diagnosis not present

## 2015-11-07 LAB — LIPID PANEL
CHOL/HDL RATIO: 3.1 ratio (ref <=5.0)
Cholesterol: 158 mg/dL (ref 125–200)
HDL: 51 mg/dL (ref >=46)
LDL CALC: 77 mg/dL (ref <130)
TRIGLYCERIDES: 151 mg/dL — AB (ref <150)
VLDL: 30 mg/dL (ref <30)

## 2015-11-08 LAB — HEMOGLOBIN A1C
Hgb A1c MFr Bld: 6.8 % — ABNORMAL HIGH (ref <5.7)
Mean Plasma Glucose: 148 mg/dL — ABNORMAL HIGH (ref <117)

## 2015-11-08 LAB — VITAMIN D 25 HYDROXY (VIT D DEFICIENCY, FRACTURES): VIT D 25 HYDROXY: 36 ng/mL (ref 30–100)

## 2015-11-08 LAB — HEPATITIS C ANTIBODY: HCV AB: NEGATIVE

## 2015-11-14 ENCOUNTER — Other Ambulatory Visit: Payer: Self-pay | Admitting: Family Medicine

## 2015-11-14 ENCOUNTER — Ambulatory Visit (INDEPENDENT_AMBULATORY_CARE_PROVIDER_SITE_OTHER): Payer: Medicare PPO | Admitting: Family Medicine

## 2015-11-14 ENCOUNTER — Encounter: Payer: Self-pay | Admitting: Family Medicine

## 2015-11-14 VITALS — BP 140/78 | HR 93 | Resp 18 | Ht 67.0 in | Wt 220.0 lb

## 2015-11-14 DIAGNOSIS — I10 Essential (primary) hypertension: Secondary | ICD-10-CM

## 2015-11-14 DIAGNOSIS — Z1211 Encounter for screening for malignant neoplasm of colon: Secondary | ICD-10-CM

## 2015-11-14 DIAGNOSIS — Z0000 Encounter for general adult medical examination without abnormal findings: Secondary | ICD-10-CM | POA: Diagnosis not present

## 2015-11-14 DIAGNOSIS — E119 Type 2 diabetes mellitus without complications: Secondary | ICD-10-CM

## 2015-11-14 DIAGNOSIS — R809 Proteinuria, unspecified: Secondary | ICD-10-CM

## 2015-11-14 DIAGNOSIS — IMO0001 Reserved for inherently not codable concepts without codable children: Secondary | ICD-10-CM

## 2015-11-14 DIAGNOSIS — E785 Hyperlipidemia, unspecified: Secondary | ICD-10-CM

## 2015-11-14 LAB — HEMOCCULT GUIAC POC 1CARD (OFFICE): FECAL OCCULT BLD: NEGATIVE

## 2015-11-14 MED ORDER — MEIJER LANCETS MISC: Status: DC

## 2015-11-14 MED ORDER — GLUCOSE BLOOD VI STRP: ORAL_STRIP | Status: DC

## 2015-11-14 NOTE — Assessment & Plan Note (Signed)

## 2015-11-14 NOTE — Patient Instructions (Addendum)
F/u in 4.5 month, call if you need me  Before  CONGRATS on stopping snuff!  Pls change to boiled eggs from fried and throw away the yellow, TG have increasedr.  Blood sugar slightly up and your weight also, just reduce sweets, cake, pies and candy also portion sizes  BP just above goal today, wil;l be better next visit with change in eating, no change in medication  HBA1C, fasting lipid, cmp and EGFR in 4.5 month  Thanks for choosing [facility], we consider it a privelige to serve you.  All the best for 2017!

## 2015-11-14 NOTE — Assessment & Plan Note (Signed)
Not at goal. No med change  DASH diet and commitment to daily physical activity for a minimum of 30 minutes discussed and encouraged, as a part of hypertension management. The importance of attaining a healthy weight is also discussed.  BP/Weight 11/14/2015 08/15/2015 06/27/2015 04/25/2015 03/28/2015 123456 [id number]  Systolic BP [id number] [id number] 123456 A999333 [id number] A999333 123456  Diastolic BP 78 61 84 74 81 60 80  Wt. (Lbs) 220 - 216 214 214 214 212  BMI 34.45 - 33.82 31.59 31.59 31.59 31.29

## 2015-11-14 NOTE — Progress Notes (Signed)
   Subjective:    Patient ID: Sophia [name], female    DOB: 1951/04/22, 64 y.o.   MRN: [id number]  HPI Patient is in for annual physical exam. No other health concerns are expressed or addressed at the visit. Recent labs, if available are reviewed. Immunization is reviewed , and  updated if needed.    Review of Systems See HPI     Objective:   Physical Exam BP 140/78 mmHg  Pulse 93  Resp 18  Ht 5\' 7"  (1.702 m)  Wt 220 lb (99.791 kg)  BMI 34.45 kg/m2  SpO2 92%  Pleasant well nourished female, alert and oriented x 3, in no cardio-pulmonary distress. Afebrile. HEENT No facial trauma or asymetry. Sinuses non tender.  Extra occullar muscles intact,  External ears normal, tympanic membranes clear. Oropharynx moist, no exudate, poor  Dentition.Upper plate, edentulous in lower jaw Neck: supple, no adenopathy,JVD or thyromegaly.No bruits.  Chest: Clear to ascultation bilaterally.No crackles or wheezes. Non tender to palpation  Breast: No asymetry,no masses or lumps. No tenderness. No nipple discharge or inversion. No axillary or supraclavicular adenopathy  Cardiovascular system; Heart sounds normal,  S1 and  S2 ,no S3.  No murmur, or thrill. Apical beat not displaced Peripheral pulses normal.  Abdomen: Soft, non tender, no organomegaly or masses. No bruits. Bowel sounds normal. No guarding, tenderness or rebound.  Rectal:  Normal sphincter tone. No mass.No rectal masses.  Guaiac negative stool.  GU: External genitalia normal female genitalia , female distribution of hair. No lesions. Urethral meatus normal in size, no  Prolapse, no lesions visibly  Present. Bladder non tender. Vagina pink and moist , with no visible lesions , discharge present . Adequate pelvic support no  cystocele or rectocele noted Uterus absent, no adnexal masses, no adnexal tenderness.   Musculoskeletal exam: Full ROM of spine, hips , shoulders and knees. No deformity ,swelling or  crepitus noted. No muscle wasting or atrophy.   Neurologic: Cranial nerves 2 to 12 intact. Power, tone ,sensation and reflexes normal throughout. No disturbance in gait. No tremor.  Skin: Intact, no ulceration, erythema , scaling or rash noted. Pigmentation normal throughout  Psych; Normal mood and affect. Judgement and concentration normal        Assessment & Plan:  Annual physical exam Annual exam as documented. Counseling done  re healthy lifestyle involving commitment to 150 minutes exercise per week, heart healthy diet, and attaining healthy weight.The importance of adequate sleep also discussed. Regular seat belt use and home safety, is also discussed. Changes in health habits are decided on by the patient with goals and time frames  set for achieving them. Immunization and cancer screening needs are specifically addressed at this visit.   Essential hypertension Not at goal. No med change  DASH diet and commitment to daily physical activity for a minimum of 30 minutes discussed and encouraged, as a part of hypertension management. The importance of attaining a healthy weight is also discussed.  BP/Weight 11/14/2015 08/15/2015 06/27/2015 04/25/2015 03/28/2015 123456 [id number]  Systolic BP XX123456 A999333 123456 A999333 XX123456 A999333 123456  Diastolic BP 78 61 84 74 81 60 80  Wt. (Lbs) 220 - 216 214 214 214 212  BMI 34.45 - 33.82 31.59 31.59 31.59 31.29

## 2015-11-20 ENCOUNTER — Emergency Department (HOSPITAL_COMMUNITY)
Admission: EM | Admit: 2015-11-20 | Discharge: 2015-11-20 | Disposition: A | Discharge status: Home / Self Care | Payer: Medicare PPO | Attending: Emergency Medicine | Admitting: Emergency Medicine

## 2015-11-20 ENCOUNTER — Encounter (HOSPITAL_COMMUNITY): Payer: Self-pay | Admitting: *Deleted

## 2015-11-20 DIAGNOSIS — E11649 Type 2 diabetes mellitus with hypoglycemia without coma: Secondary | ICD-10-CM | POA: Insufficient documentation

## 2015-11-20 DIAGNOSIS — I10 Essential (primary) hypertension: Secondary | ICD-10-CM | POA: Insufficient documentation

## 2015-11-20 DIAGNOSIS — E162 Hypoglycemia, unspecified: Secondary | ICD-10-CM

## 2015-11-20 DIAGNOSIS — Z7984 Long term (current) use of oral hypoglycemic drugs: Secondary | ICD-10-CM | POA: Insufficient documentation

## 2015-11-20 DIAGNOSIS — Z79899 Other long term (current) drug therapy: Secondary | ICD-10-CM | POA: Diagnosis not present

## 2015-11-20 DIAGNOSIS — F329 Major depressive disorder, single episode, unspecified: Secondary | ICD-10-CM | POA: Diagnosis not present

## 2015-11-20 DIAGNOSIS — Z87891 Personal history of nicotine dependence: Secondary | ICD-10-CM | POA: Diagnosis not present

## 2015-11-20 DIAGNOSIS — F419 Anxiety disorder, unspecified: Secondary | ICD-10-CM | POA: Insufficient documentation

## 2015-11-20 DIAGNOSIS — E7800 Pure hypercholesterolemia, unspecified: Secondary | ICD-10-CM | POA: Diagnosis not present

## 2015-11-20 DIAGNOSIS — M1990 Unspecified osteoarthritis, unspecified site: Secondary | ICD-10-CM | POA: Diagnosis not present

## 2015-11-20 DIAGNOSIS — K219 Gastro-esophageal reflux disease without esophagitis: Secondary | ICD-10-CM | POA: Diagnosis not present

## 2015-11-20 LAB — COMPREHENSIVE METABOLIC PANEL
ALBUMIN: 4.5 g/dL (ref 3.5–5.0)
ALK PHOS: 75 U/L (ref 38–126)
ALT: 29 U/L (ref 14–54)
ANION GAP: 8 (ref 5–15)
AST: 24 U/L (ref 15–41)
BILIRUBIN TOTAL: 0.2 mg/dL — AB (ref 0.3–1.2)
BUN: 15 mg/dL (ref 6–20)
CALCIUM: 9.6 mg/dL (ref 8.9–10.3)
CO2: 30 mmol/L (ref 22–32)
Chloride: 104 mmol/L (ref 101–111)
Creatinine, Ser: 0.75 mg/dL (ref 0.44–1.00)
GFR calc non Af Amer: >60 mL/min (ref >60)
Glucose, Bld: 119 mg/dL — ABNORMAL HIGH (ref 65–99)
POTASSIUM: 3.7 mmol/L (ref 3.5–5.1)
SODIUM: 142 mmol/L (ref 135–145)
TOTAL PROTEIN: 7.9 g/dL (ref 6.5–8.1)

## 2015-11-20 LAB — CBC WITH DIFFERENTIAL/PLATELET
BASOS PCT: 0 %
Basophils Absolute: 0 10*3/uL (ref 0.0–0.1)
EOS ABS: 0.2 10*3/uL (ref 0.0–0.7)
Eosinophils Relative: 3 %
HCT: 37.4 % (ref 36.0–46.0)
HEMOGLOBIN: 11.9 g/dL — AB (ref 12.0–15.0)
LYMPHS ABS: 2.2 10*3/uL (ref 0.7–4.0)
Lymphocytes Relative: 35 %
MCH: 25.9 pg — AB (ref 26.0–34.0)
MCHC: 31.8 g/dL (ref 30.0–36.0)
MCV: 81.5 fL (ref 78.0–100.0)
MONO ABS: 0.4 10*3/uL (ref 0.1–1.0)
MONOS PCT: 7 %
NEUTROS PCT: 55 %
Neutro Abs: 3.5 10*3/uL (ref 1.7–7.7)
Platelets: 227 10*3/uL (ref 150–400)
RBC: 4.59 MIL/uL (ref 3.87–5.11)
RDW: 15.6 % — AB (ref 11.5–15.5)
WBC: 6.3 10*3/uL (ref 4.0–10.5)

## 2015-11-20 LAB — CBG MONITORING, ED
Glucose-Capillary: 106 mg/dL — ABNORMAL HIGH (ref 65–99)
Glucose-Capillary: 105 mg/dL — ABNORMAL HIGH (ref 65–99)

## 2015-11-20 NOTE — ED Notes (Signed)
Pt reports blood sugar has been dropping lately. Recently started taking Metformin again and had the same problem before. CBG was 68 this am, 86 this afternoon. States she doesn't have this problem with Januvia. CBG 106 in triage.

## 2015-11-20 NOTE — Discharge Instructions (Signed)
Stop taking your metformin. Call your family doctor tomorrow and let them know you are stopping the metformin. Dr. Moshe Cipro may want to start another medicine.

## 2015-11-20 NOTE — ED Provider Notes (Signed)
CSN: [id number]     Arrival date & time 11/20/15  1639 History  By signing my name below, I, Soijett Blue, attest that this documentation has been prepared under the direction and in the presence of Milton Ferguson, MD. Electronically Signed: Soijett Blue, ED Scribe. 11/20/2015. 8:40 PM.   Chief Complaint  Patient presents with  . Hypoglycemia      Patient is a 65 y.o. female presenting with hypoglycemia. The history is provided by the patient (Pt is complaining of hypoglycemia). No language interpreter was used.  Hypoglycemia Initial blood sugar:  68 this morning and 86 this afternoon Blood sugar after intervention:  106 following eating peppermints in the waiting room Severity:  Moderate Onset quality:  Sudden Duration:  1 day Timing:  Sporadic Progression:  Unchanged Chronicity:  Recurrent Diabetic status:  Controlled with oral medications Current diabetic therapy:  Metformin Context: not treatment noncompliance   Relieved by:  Eating Ineffective treatments:  None tried Associated symptoms: no seizures   Risk factors: no uncontrolled diabetes     JARVIS LESURE is a 65 y.o. female with a medical hx of DM, HTN, hypercholesteremia, who presents to the Emergency Department complaining of hypoglycemia onset today. She reports that her sugar keeps dropping recently and that eating is the only thing that will keep her sugar from dropping. She reports that she takes metformin for her DM and that this has happened in the past with this medication. She reports that her blood sugar was 68 this morning and 86 this afternoon and while she was in the waiting room tonight, she was eating peppermints and now her blood sugar is 106 in the ED. She notes that when that the last time these symptoms occurred she was Rx Januvia that alleviated her symptoms but since retiring she has had to change back to metformin due to insurance coverage. She notes that she has not tried any other medications for the  relief of her symptoms. She denies any other symptoms.    Pt PCP: Tula Nakayama, MD   Past Medical History  Diagnosis Date  . PONV (postoperative nausea and vomiting)   . Hypercholesteremia   . GERD (gastroesophageal reflux disease)   . Hypertension   . Anxiety   . Depression   . Diabetes mellitus without complication ([place])   . Arthritis    Past Surgical History  Procedure Laterality Date  . Abdominal surgery      removal of tumors  . Cataract extraction w/phaco Right 01/11/2014    Procedure: CATARACT EXTRACTION PHACO AND INTRAOCULAR LENS PLACEMENT (IOC);  Surgeon: Elta Guadeloupe T. Gershon Crane, MD;  Location: [facility];  Service: Ophthalmology;  Laterality: Right;  CDE 5.57  . Cataract extraction w/phaco Left 01/25/2014    Procedure: CATARACT EXTRACTION PHACO AND INTRAOCULAR LENS PLACEMENT (IOC);  Surgeon: Elta Guadeloupe T. Gershon Crane, MD;  Location: [facility];  Service: Ophthalmology;  Laterality: Left;  CDE:5.19  . Upper gastrointestinal endoscopy  2007 CHEST PAIN    NL EXAM  . Colonoscopy  2007 [facility]    NL EXAM  . Colonoscopy N/A 04/22/2014    Dr. Fields:Normal mucosa in the terminal ileum/Two COLON polyps REMOVED/ Mild diverticulosis in the ascending colon and transverse colon/The LEFT colon IS redundant/Small internal hemorrhoids. Path: tubular adenoma. Next colonoscopy in 5-10 years  . Esophagogastroduodenoscopy N/A 04/22/2014    Dr. Fields:MICROCYTIC ANEMIA MOST LIKELY DUE TO ASA/VOLTAREN/Small hiatal hernia/MODERATE Non-erosive gastritis. Negative H.pylori  . Yag laser application Right [id number]    Procedure: YAG LASER APPLICATION;  Surgeon: Elta Guadeloupe T. Gershon Crane, MD;  Location: [facility];  Service: Ophthalmology;  Laterality: Right;  . Breast surgery Left 1983    benign tumor  . Abdominal hysterectomy  1982    tubes and womb, bleeding and ectopic  . Resection axillary tumor Left 1986    benign  . Yag laser application Left [id number]    Procedure: YAG LASER APPLICATION;  Surgeon: Rutherford Guys, MD;  Location:  [facility];  Service: Ophthalmology;  Laterality: Left;   Family History  Problem Relation Age of Onset  . Colon polyps Sister 72  . Diabetes Sister   . Hypertension Sister   . Kidney disease Sister   . Colon cancer Neg Hx   . Arthritis Father   . Cancer Father     prostate   . Hypertension Father   . Diabetes Sister   . Hypertension Sister    Social History  Substance Use Topics  . Smoking status: Former Smoker -- 0.50 packs/day for 20 years    Types: Cigarettes    Quit date: 01/05/1990  . Smokeless tobacco: Former Systems developer    Types: Snuff    Quit date: 10/12/2015  . Alcohol Use: No   OB History    No data available     Review of Systems  Constitutional: Negative for appetite change and fatigue.  HENT: Negative for congestion, ear discharge and sinus pressure.   Eyes: Negative for discharge.  Respiratory: Negative for cough.   Cardiovascular: Negative for chest pain.  Gastrointestinal: Negative for abdominal pain and diarrhea.  Genitourinary: Negative for frequency and hematuria.  Musculoskeletal: Negative for back pain.  Skin: Negative for rash.  Neurological: Negative for seizures and headaches.  Psychiatric/Behavioral: Negative for hallucinations.     Allergies  Review of patient's allergies indicates no known allergies.  Home Medications   Prior to Admission medications   Medication Sig Start Date End Date Taking? Authorizing Provider  acetaminophen (TYLENOL) 325 MG tablet Take 325 mg by mouth every 6 (six) hours as needed for moderate pain.    Historical Provider, MD  benazepril-hydrochlorthiazide (LOTENSIN HCT) 20-12.5 MG tablet Take 2 tablets by mouth daily. 09/05/15   Fayrene Helper, MD  busPIRone (BUSPAR) 5 MG tablet Take 1 tablet (5 mg total) by mouth 2 (two) times daily. 09/05/15   Fayrene Helper, MD  calcium-vitamin D (OSCAL WITH D) 500-200 MG-UNIT per tablet Take 1 tablet by mouth daily with breakfast.    Historical Provider, MD  diltiazem (CARTIA  XT) 180 MG 24 hr capsule Take 1 capsule (180 mg total) by mouth daily. 09/05/15   Fayrene Helper, MD  FLUoxetine (PROZAC) 40 MG capsule Take 1 capsule (40 mg total) by mouth daily. 09/05/15   Fayrene Helper, MD  gabapentin (NEURONTIN) 100 MG capsule Take 1 capsule (100 mg total) by mouth 3 (three) times daily. 09/05/15   Fayrene Helper, MD  glucose blood [facility] TRUETEST TEST) test strip Use as instructed once daily testing 11/14/15   Fayrene Helper, MD  [facility] LANCETS MISC Use as directed with TrueResult meter 11/14/15   Fayrene Helper, MD  metFORMIN (GLUCOPHAGE) 500 MG tablet Take 1 tablet (500 mg total) by mouth daily with breakfast. 09/05/15   Fayrene Helper, MD  Multiple Vitamin (MULTIVITAMIN WITH MINERALS) TABS tablet Take 1 tablet by mouth daily.    Historical Provider, MD  Omega-3 Fatty Acids (FISH OIL) 1000 MG CAPS Take 1 capsule by mouth daily.    Historical Provider, MD  pantoprazole (PROTONIX) 40 MG tablet TAKE 1 TABLET EVERY DAY 11/14/15   Fayrene Helper, MD  pravastatin (PRAVACHOL) 40 MG tablet Take 1 tablet (40 mg total) by mouth every evening. 09/05/15   Fayrene Helper, MD  terbinafine (LAMISIL) 250 MG tablet Take 1 tablet (250 mg total) by mouth daily. 07/07/15   Fayrene Helper, MD   BP 146/75 mmHg  Pulse 67  Temp(Src) 98 F (36.7 C) (Oral)  Resp 18  Ht 5\' 9"  (1.753 m)  Wt 215 lb (97.523 kg)  BMI 31.74 kg/m2  SpO2 97% Physical Exam  Constitutional: She is oriented to person, place, and time. She appears well-developed.  HENT:  Head: Normocephalic.  Eyes: Conjunctivae and EOM are normal. No scleral icterus.  Neck: Neck supple. No thyromegaly present.  Cardiovascular: Normal rate, regular rhythm and normal heart sounds.  Exam reveals no gallop and no friction rub.   No murmur heard. Pulmonary/Chest: Effort normal and breath sounds normal. No stridor. No respiratory distress. She has no wheezes. She has no rales. She exhibits no  tenderness.  Abdominal: Soft. She exhibits no distension. There is no tenderness. There is no rebound.  Musculoskeletal: Normal range of motion. She exhibits no edema.  Lymphadenopathy:    She has no cervical adenopathy.  Neurological: She is oriented to person, place, and time. She exhibits normal muscle tone. Coordination normal.  Skin: No rash noted. No erythema.  Psychiatric: She has a normal mood and affect. Her behavior is normal.  Nursing note and vitals reviewed.   ED Course  Procedures (including critical care time) DIAGNOSTIC STUDIES: Oxygen Saturation is 99% on RA, nl by my interpretation.    COORDINATION OF CARE: 8:22 PM Discussed treatment plan with pt at bedside which includes labs and pt agreed to plan.    Labs Review Labs Reviewed  CBC WITH DIFFERENTIAL/PLATELET - Abnormal; Notable for the following:    Hemoglobin 11.9 (*)    MCH 25.9 (*)    RDW 15.6 (*)    All other components within normal limits  COMPREHENSIVE METABOLIC PANEL - Abnormal; Notable for the following:    Glucose, Bld 119 (*)    Total Bilirubin 0.2 (*)    All other components within normal limits  CBG MONITORING, ED - Abnormal; Notable for the following:    Glucose-Capillary 106 (*)    All other components within normal limits  CBG MONITORING, ED - Abnormal; Notable for the following:    Glucose-Capillary 105 (*)    All other components within normal limits    Imaging Review No results found. I have personally reviewed and evaluated these lab results as part of my medical decision-making.   EKG Interpretation None      MDM   Final diagnoses:  Hypoglycemia   Patient with hypoglycemia on metformin. Sugar has been stable for 4 hours. Patient states this is happened before metformin she does better on Januvia but insurance has changed and she cannot afford it. She is going to stop metformin and call her primary care doctor tomorrow   Milton Ferguson, MD 11/20/15 2101

## 2015-11-20 NOTE — ED Notes (Signed)
Patient verbalizes understanding of discharge instructions, home care and follow up care. Patient ambulatory out of department at this time with family member

## 2015-11-21 ENCOUNTER — Telehealth: Payer: Self-pay

## 2015-11-21 NOTE — Telephone Encounter (Signed)
Called and left message for patient to return call.  

## 2015-11-21 NOTE — Telephone Encounter (Signed)
If hypoglycemic on 500 mg metformin with HBa1C less than 7, STOP metformin, and relyu on diet and exercise only, rept HBA1C in 3.5 month

## 2015-11-21 NOTE — Telephone Encounter (Signed)
Patient aware and will have labs done as ordered.

## 2016-01-01 ENCOUNTER — Other Ambulatory Visit: Payer: Self-pay

## 2016-01-01 MED ORDER — DILTIAZEM HCL ER COATED BEADS 180 MG PO CP24: 180.0000 mg | ORAL_CAPSULE | Freq: Every day | ORAL | Status: DC

## 2016-01-01 MED ORDER — BENAZEPRIL-HYDROCHLOROTHIAZIDE 20-12.5 MG PO TABS: 2.0000 | ORAL_TABLET | Freq: Every day | ORAL | Status: DC

## 2016-01-01 MED ORDER — FLUOXETINE HCL 40 MG PO CAPS: 40.0000 mg | ORAL_CAPSULE | Freq: Every day | ORAL | Status: DC

## 2016-03-20 DIAGNOSIS — I10 Essential (primary) hypertension: Secondary | ICD-10-CM | POA: Diagnosis not present

## 2016-03-20 DIAGNOSIS — E119 Type 2 diabetes mellitus without complications: Secondary | ICD-10-CM | POA: Diagnosis not present

## 2016-03-20 DIAGNOSIS — E785 Hyperlipidemia, unspecified: Secondary | ICD-10-CM | POA: Diagnosis not present

## 2016-03-20 DIAGNOSIS — R809 Proteinuria, unspecified: Secondary | ICD-10-CM | POA: Diagnosis not present

## 2016-03-20 LAB — HEMOGLOBIN A1C
Hgb A1c MFr Bld: 6.6 % — ABNORMAL HIGH (ref <5.7)
MEAN PLASMA GLUCOSE: 143 mg/dL

## 2016-03-21 LAB — COMPLETE METABOLIC PANEL WITH GFR
ALT: 22 U/L (ref 6–29)
AST: 16 U/L (ref 10–35)
Albumin: 4.3 g/dL (ref 3.6–5.1)
Alkaline Phosphatase: 66 U/L (ref 33–130)
BILIRUBIN TOTAL: 0.2 mg/dL (ref 0.2–1.2)
BUN: 16 mg/dL (ref 7–25)
CHLORIDE: 102 mmol/L (ref 98–110)
CO2: 29 mmol/L (ref 20–31)
Calcium: 9.2 mg/dL (ref 8.6–10.4)
Creat: 0.84 mg/dL (ref 0.50–0.99)
GFR, EST AFRICAN AMERICAN: 84 mL/min (ref >=60)
GFR, EST NON AFRICAN AMERICAN: 73 mL/min (ref >=60)
GLUCOSE: 100 mg/dL — AB (ref 65–99)
POTASSIUM: 3.9 mmol/L (ref 3.5–5.3)
SODIUM: 142 mmol/L (ref 135–146)
Total Protein: 6.6 g/dL (ref 6.1–8.1)

## 2016-03-21 LAB — LIPID PANEL
Cholesterol: 134 mg/dL (ref 125–200)
HDL: 52 mg/dL (ref >=46)
LDL CALC: 58 mg/dL (ref <130)
TRIGLYCERIDES: 120 mg/dL (ref <150)
Total CHOL/HDL Ratio: 2.6 Ratio (ref <=5.0)
VLDL: 24 mg/dL (ref <30)

## 2016-03-25 ENCOUNTER — Other Ambulatory Visit (HOSPITAL_COMMUNITY)
Admission: RE | Admit: 2016-03-25 | Discharge: 2016-03-25 | Disposition: A | Discharge status: Home / Self Care | Payer: Medicare PPO | Source: Other Acute Inpatient Hospital | Attending: Family Medicine | Admitting: Family Medicine

## 2016-03-25 ENCOUNTER — Ambulatory Visit (INDEPENDENT_AMBULATORY_CARE_PROVIDER_SITE_OTHER): Payer: Medicare PPO | Admitting: Family Medicine

## 2016-03-25 VITALS — BP 122/84 | HR 94 | Resp 16 | Ht 69.0 in | Wt 211.0 lb

## 2016-03-25 DIAGNOSIS — R1032 Left lower quadrant pain: Secondary | ICD-10-CM

## 2016-03-25 DIAGNOSIS — N3001 Acute cystitis with hematuria: Secondary | ICD-10-CM

## 2016-03-25 DIAGNOSIS — I10 Essential (primary) hypertension: Secondary | ICD-10-CM

## 2016-03-25 DIAGNOSIS — K219 Gastro-esophageal reflux disease without esophagitis: Secondary | ICD-10-CM

## 2016-03-25 DIAGNOSIS — Z23 Encounter for immunization: Secondary | ICD-10-CM | POA: Diagnosis not present

## 2016-03-25 DIAGNOSIS — F17220 Nicotine dependence, chewing tobacco, uncomplicated: Secondary | ICD-10-CM

## 2016-03-25 DIAGNOSIS — J302 Other seasonal allergic rhinitis: Secondary | ICD-10-CM

## 2016-03-25 DIAGNOSIS — E119 Type 2 diabetes mellitus without complications: Secondary | ICD-10-CM

## 2016-03-25 DIAGNOSIS — E785 Hyperlipidemia, unspecified: Secondary | ICD-10-CM

## 2016-03-25 DIAGNOSIS — R809 Proteinuria, unspecified: Secondary | ICD-10-CM

## 2016-03-25 DIAGNOSIS — IMO0001 Reserved for inherently not codable concepts without codable children: Secondary | ICD-10-CM

## 2016-03-25 DIAGNOSIS — F418 Other specified anxiety disorders: Secondary | ICD-10-CM | POA: Diagnosis not present

## 2016-03-25 LAB — POCT URINALYSIS DIPSTICK
Bilirubin, UA: NEGATIVE
Glucose, UA: NEGATIVE
Ketones, UA: NEGATIVE
NITRITE UA: NEGATIVE
PH UA: 7
PROTEIN UA: 100
Spec Grav, UA: 1.02
UROBILINOGEN UA: 0.2

## 2016-03-25 MED ORDER — RANITIDINE HCL 300 MG PO CAPS: 300.0000 mg | ORAL_CAPSULE | Freq: Every evening | ORAL | Status: DC

## 2016-03-25 MED ORDER — CIPROFLOXACIN HCL 500 MG PO TABS: 500.0000 mg | ORAL_TABLET | Freq: Two times a day (BID) | ORAL | Status: DC

## 2016-03-25 MED ORDER — FLUTICASONE PROPIONATE 50 MCG/ACT NA SUSP: 2.0000 | Freq: Every day | NASAL | Status: DC

## 2016-03-25 MED ORDER — BUSPIRONE HCL 5 MG PO TABS: 5.0000 mg | ORAL_TABLET | Freq: Three times a day (TID) | ORAL | Status: DC

## 2016-03-25 MED ORDER — TEMAZEPAM 15 MG PO CAPS
15.0000 mg | ORAL_CAPSULE | Freq: Every evening | ORAL | Status: DC | PRN
Start: 2016-03-25 — End: 2016-11-19

## 2016-03-25 NOTE — Patient Instructions (Signed)
Annual wellness in 4 month, call if you need me sooner  Condolence and prayers for you and your family  Labs improved and excellent  INCREASE BUSPAR to three times daily New medication restoril sent for use to help with sleep short term Continue therapy  For reflux and heartburn, new is zantac daily, continue protonox as before  For mild diverticulitis and possible urine infection 3 days of ciprofloxacin sent to your local pharmacy  Pneumonia vaccine today  Thanks for choosing [facility], we consider it a privelige to serve you.

## 2016-03-25 NOTE — Progress Notes (Signed)
Subjective:    Patient ID: Sophia [name], female    DOB: Mar 21, 1951, 65 y.o.   MRN: [id number]  HPI   Sophia [name]     MRN: [id number]      DOB: 1951/08/13   HPI Sophia [name] is here for follow up and re-evaluation of chronic medical conditions, medication management and review of any available recent lab and radiology data.  Preventive health is updated, specifically  Cancer screening and Immunization.   . The PT denies any adverse reactions to current medications since the last visit.    ROS Denies recent fever or chills. Denies sinus pressure, nasal congestion, ear pain or sore throat. Denies chest congestion, productive cough or wheezing. Denies chest pains, palpitations and leg swelling Denies  nausea, vomiting,diarrhea or constipation.   C/o  Frequency, denies  hesitancy or incontinence. Denies joint pain, swelling and limitation in mobility. Denies headaches, seizures, numbness, or tingling. C/o increased  depression, anxiety and  Insomnia following unexpected death of her daughter in law Denies skin break down or rash.   PE  BP 122/84 mmHg  Pulse 94  Resp 16  Ht 5\' 9"  (1.753 m)  Wt 211 lb (95.709 kg)  BMI 31.15 kg/m2  SpO2 95%  Patient alert and oriented and in no cardiopulmonary distress.  HEENT: No facial asymmetry, EOMI,   oropharynx pink and moist.  Neck supple no JVD, no mass. TM clear bilaterally Chest: Clear to auscultation bilaterally.  CVS: S1, S2 no murmurs, no S3.Regular rate.  ABD: Soft left lower quadrant tenderness with guarding, no rebound, normal BS.   Ext: No edema  MS: Adequate ROM spine, shoulders, hips and knees.  Skin: Intact, no ulcerations or rash noted.  Psych: Good eye contact, tearful affect. Memory intact not anxious mildly   depressed appearing.  CNS: CN 2-12 intact, power,  normal throughout.no focal deficits noted.   Assessment & Plan   Depression with anxiety currently increased and uncontrolled due to unexpected  death of her daughter in law and son's response. Add restoril for sleep and increase buspar dose. Currently in therapy. Not suicidal or homicidal  Acute cystitis with hematuria Abn UA and symptomatic, cipro sent for 3 days  Chewing tobacco nicotine dependence, uncomplicated Restarted snuff since stressed, plans to quit again once improved  Essential hypertension Controlled, no change in medication DASH diet and commitment to daily physical activity for a minimum of 30 minutes discussed and encouraged, as a part of hypertension management. The importance of attaining a healthy weight is also discussed.  BP/Weight 03/25/2016 11/20/2015 11/14/2015 08/15/2015 06/27/2015 04/25/2015 [id number]  Systolic BP 123XX123 [id number] [id number] 123456 [id number]  Diastolic BP 84 75 78 61 84 74 81  Wt. (Lbs) 211 215 220 - 216 214 214  BMI 31.15 31.74 34.45 - 33.82 31.59 31.59        GERD Currently uncontrolled , add zantac  Diabetes with proteinuria Improved Sophia [name] is reminded of the importance of commitment to daily physical activity for 30 minutes or more, as able and the need to limit carbohydrate intake to 30 to 60 grams per meal to help with blood sugar control.   The need to take medication as prescribed, test blood sugar as directed, and to call between visits if there is a concern that blood sugar is uncontrolled is also discussed.   Sophia [name] is reminded of the importance of daily foot exam, annual eye examination, and good blood sugar, blood pressure and cholesterol control.  Diabetic Labs Latest Ref Rng 03/20/2016 11/20/2015 11/07/2015 07/14/2015 06/19/2015  HbA1c <5.7 % 6.6(H) - 6.8(H) 6.4(H) -  Microalbumin <2.0 mg/dL - - - - -  Micro/Creat Ratio 0.0 - 30.0 mg/g - - - - -  Chol 125 - 200 mg/dL 134 - 158 - -  HDL >=46 mg/dL 52 - 51 - -  Calc LDL <130 mg/dL 58 - 77 - -  Triglycerides <150 mg/dL 120 - 151(H) - -  Creatinine 0.50 - 0.99 mg/dL 0.84 0.75 - 0.78 0.64   BP/Weight 03/25/2016 11/20/2015 11/14/2015  08/15/2015 06/27/2015 04/25/2015 [id number]  Systolic BP 123XX123 [id number] [id number] 123456 [id number]  Diastolic BP 84 75 78 61 84 74 81  Wt. (Lbs) 211 215 220 - 216 214 214  BMI 31.15 31.74 34.45 - 33.82 31.59 31.59   Foot/eye exam completion dates Latest Ref Rng 08/01/2015 06/27/2015  Eye Exam No Retinopathy No Retinopathy -  Foot Form Completion - - Done         Hyperlipidemia with target LDL less than 100 Hyperlipidemia:Low fat diet discussed and encouraged.   Lipid Panel  Lab Results  Component Value Date   CHOL 134 03/20/2016   HDL 52 03/20/2016   LDLCALC 58 03/20/2016   TRIG 120 03/20/2016   CHOLHDL 2.6 03/20/2016   Controlled, no change in medication      Abdominal pain, left lower quadrant Attributed to mild flare of diverticulosis, 3 day cipro course prescribed      Review of Systems     Objective:   Physical Exam        Assessment & Plan:

## 2016-03-27 LAB — URINE CULTURE

## 2016-03-31 ENCOUNTER — Encounter: Payer: Self-pay | Admitting: Family Medicine

## 2016-03-31 DIAGNOSIS — R1032 Left lower quadrant pain: Secondary | ICD-10-CM | POA: Insufficient documentation

## 2016-03-31 NOTE — Assessment & Plan Note (Signed)
Abn UA and symptomatic, cipro sent for 3 days

## 2016-03-31 NOTE — Assessment & Plan Note (Signed)
Currently uncontrolled , add zantac

## 2016-03-31 NOTE — Assessment & Plan Note (Signed)
currently increased and uncontrolled due to unexpected death of her daughter in law and son's response. Add restoril for sleep and increase buspar dose. Currently in therapy. Not suicidal or homicidal

## 2016-03-31 NOTE — Assessment & Plan Note (Signed)
Restarted snuff since stressed, plans to quit again once improved

## 2016-03-31 NOTE — Assessment & Plan Note (Signed)
Improved Sophia [name] is reminded of the importance of commitment to daily physical activity for 30 minutes or more, as able and the need to limit carbohydrate intake to 30 to 60 grams per meal to help with blood sugar control.   The need to take medication as prescribed, test blood sugar as directed, and to call between visits if there is a concern that blood sugar is uncontrolled is also discussed.   Sophia [name] is reminded of the importance of daily foot exam, annual eye examination, and good blood sugar, blood pressure and cholesterol control.  Diabetic Labs Latest Ref Rng 03/20/2016 11/20/2015 11/07/2015 07/14/2015 06/19/2015  HbA1c <5.7 % 6.6(H) - 6.8(H) 6.4(H) -  Microalbumin <2.0 mg/dL - - - - -  Micro/Creat Ratio 0.0 - 30.0 mg/g - - - - -  Chol 125 - 200 mg/dL 134 - 158 - -  HDL >=46 mg/dL 52 - 51 - -  Calc LDL <130 mg/dL 58 - 77 - -  Triglycerides <150 mg/dL 120 - 151(H) - -  Creatinine 0.50 - 0.99 mg/dL 0.84 0.75 - 0.78 0.64   BP/Weight 03/25/2016 11/20/2015 11/14/2015 08/15/2015 06/27/2015 04/25/2015 [id number]  Systolic BP 123XX123 123456 [id number] A999333 123456 [id number]  Diastolic BP 84 75 78 61 84 74 81  Wt. (Lbs) 211 215 220 - 216 214 214  BMI 31.15 31.74 34.45 - 33.82 31.59 31.59   Foot/eye exam completion dates Latest Ref Rng 08/01/2015 06/27/2015  Eye Exam No Retinopathy No Retinopathy -  Foot Form Completion - - Done

## 2016-03-31 NOTE — Assessment & Plan Note (Signed)
Hyperlipidemia:Low fat diet discussed and encouraged.   Lipid Panel  Lab Results  Component Value Date   CHOL 134 03/20/2016   HDL 52 03/20/2016   LDLCALC 58 03/20/2016   TRIG 120 03/20/2016   CHOLHDL 2.6 03/20/2016   Controlled, no change in medication

## 2016-03-31 NOTE — Assessment & Plan Note (Signed)
Controlled, no change in medication DASH diet and commitment to daily physical activity for a minimum of 30 minutes discussed and encouraged, as a part of hypertension management. The importance of attaining a healthy weight is also discussed.  BP/Weight 03/25/2016 11/20/2015 11/14/2015 08/15/2015 06/27/2015 04/25/2015 Q000111Q  Systolic BP 123XX123 [id number] [id number] 123456 [id number]  Diastolic BP 84 75 78 61 84 74 81  Wt. (Lbs) 211 215 220 - 216 214 214  BMI 31.15 31.74 34.45 - 33.82 31.59 31.59

## 2016-03-31 NOTE — Assessment & Plan Note (Signed)
Attributed to mild flare of diverticulosis, 3 day cipro course prescribed

## 2016-04-09 ENCOUNTER — Other Ambulatory Visit: Payer: Self-pay | Admitting: Family Medicine

## 2016-04-16 IMAGING — US US AORTA SCREENING (MEDICARE)
1 series · 10 of 10 positions shown · non-contrast
Comparison: None.

CLINICAL DATA: Medicare screening exam for abdominal aortic
aneurysm. Hypertension, diabetes, hyperlipidemia, tobacco abuse.

EXAM:
ABDOMINAL AORTA SCREENING ULTRASOUND
TECHNIQUE: Ultrasound examination of the abdominal aorta was performed as a
screening evaluation for abdominal aortic aneurysm.

[Series 1: us aorta screening (medicare) · 0.25mm/px · 10 of 10 slices shown]
[im 1/10]
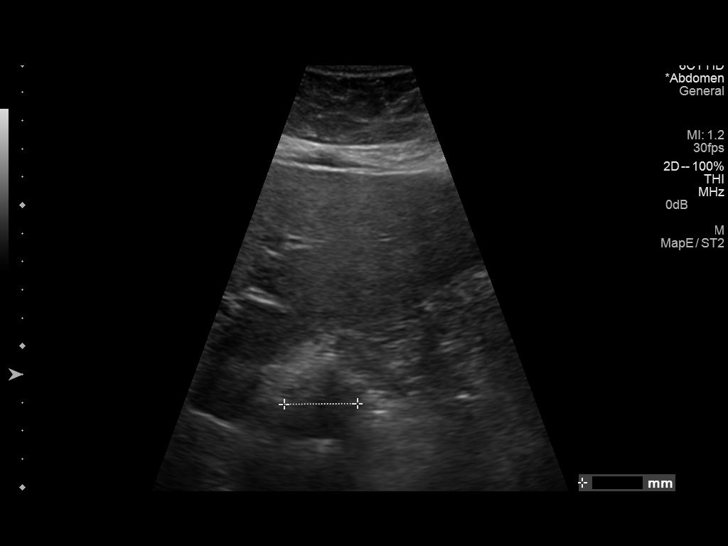
[im 2/10]
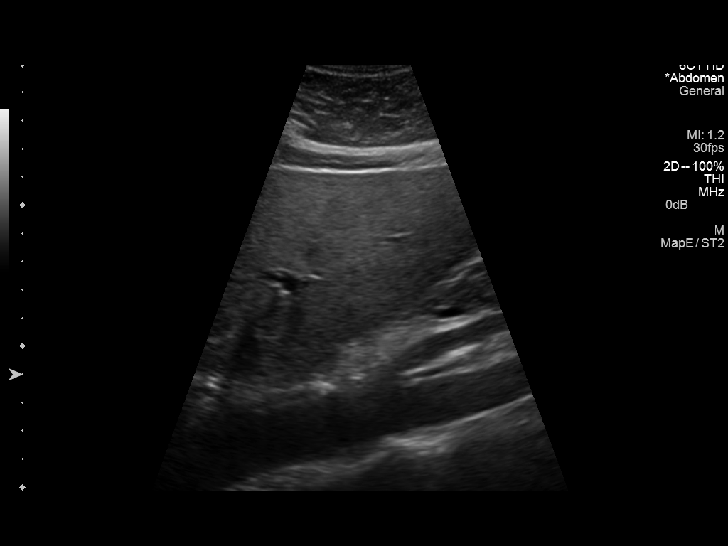
[im 3/10]
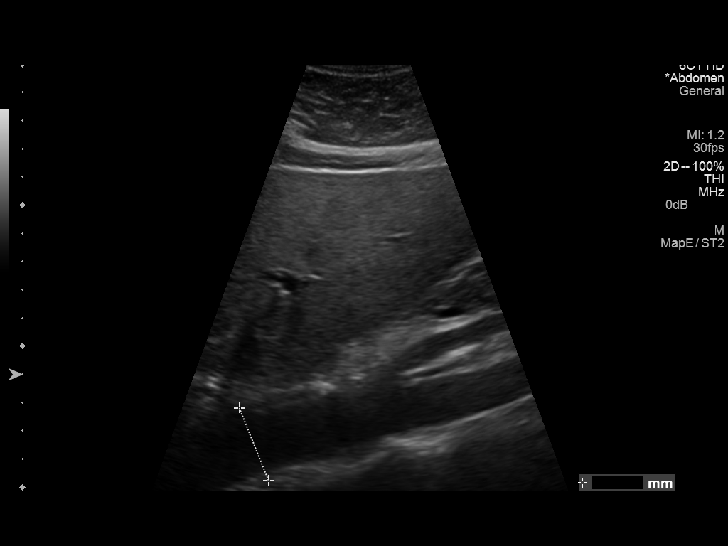
[im 4/10]
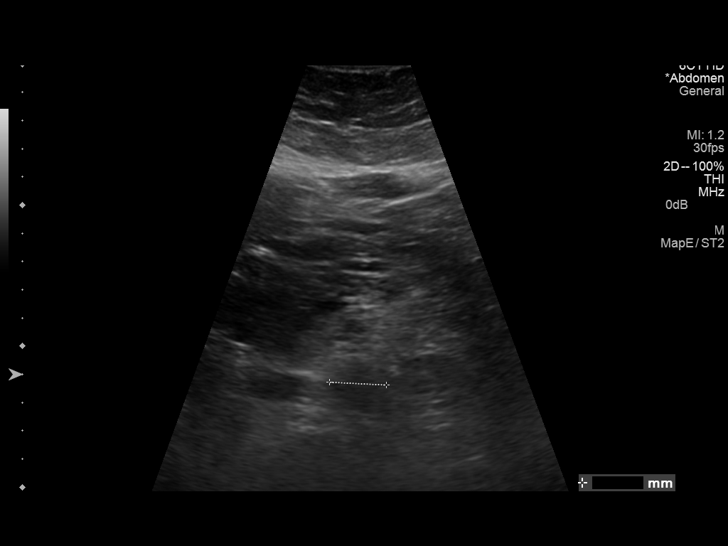
[im 5/10]
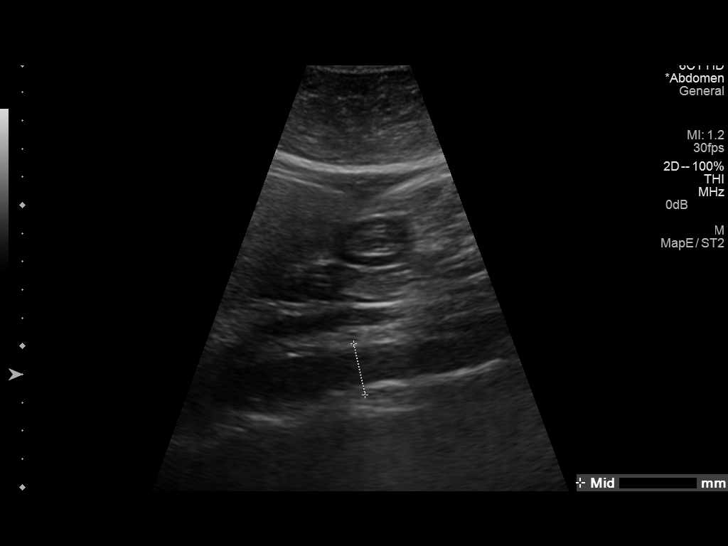
[im 6/10]
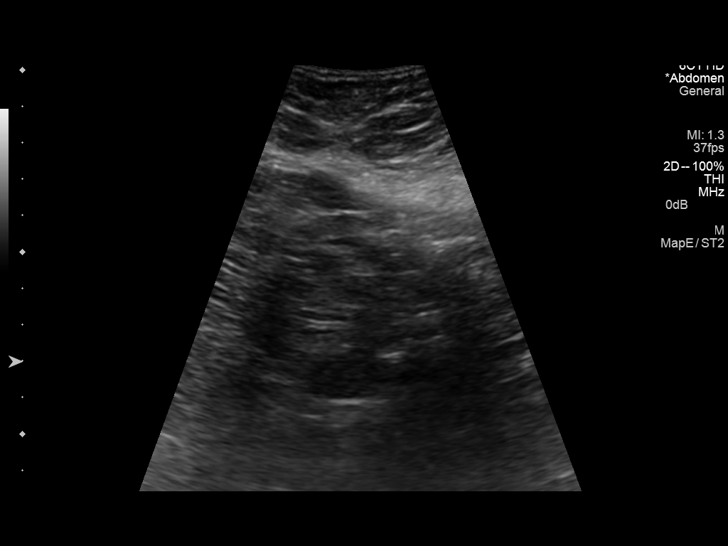
[im 7/10]
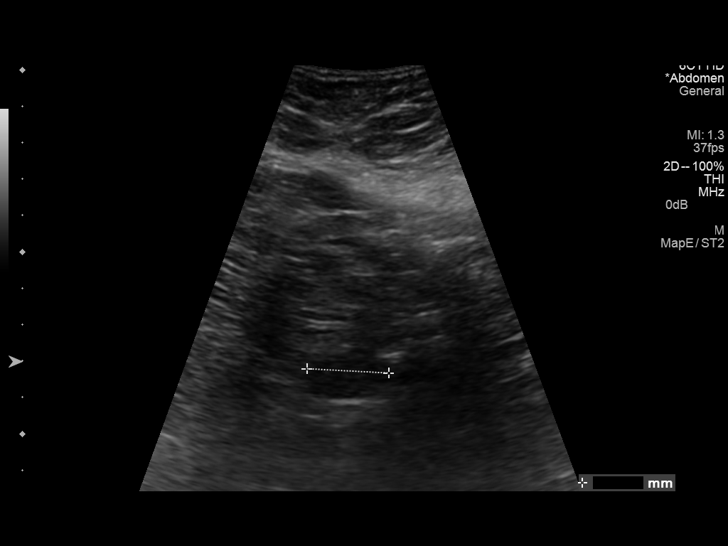
[im 8/10]
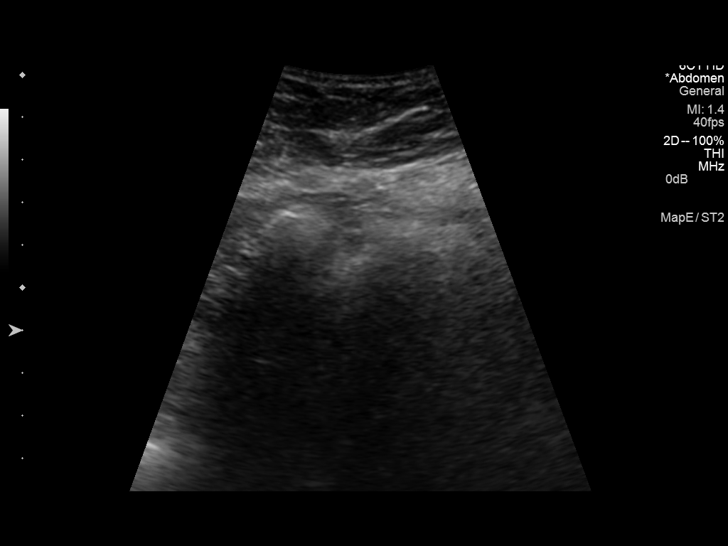
[im 9/10]
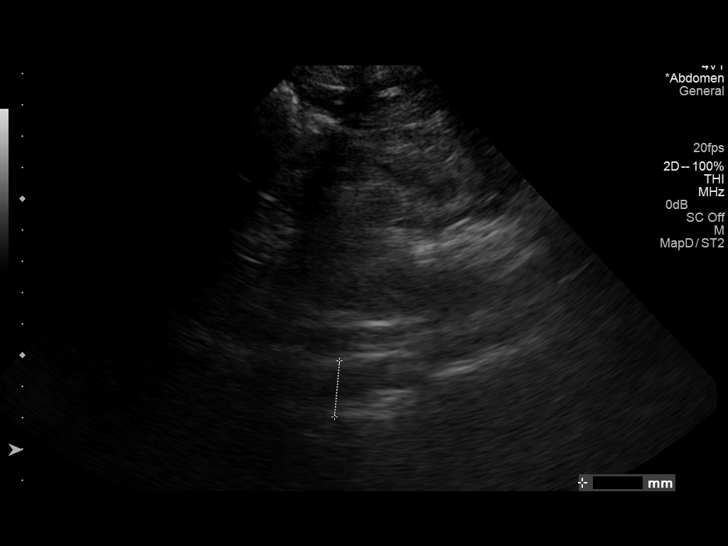
[im 10/10]
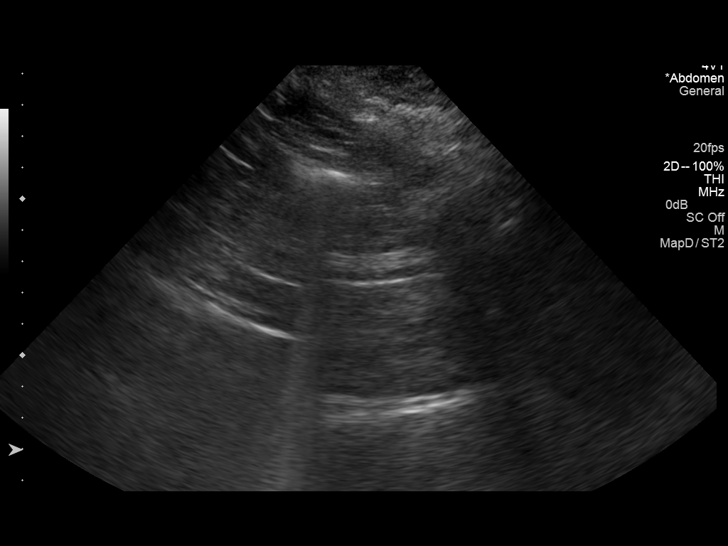

[10 of 10 positions shown; findings below may reference images not displayed]

FINDINGS: Abdominal Aorta

No aneurysm identified.

Maximum AP

Diameter:  2.8 cm, proximal segment

Maximum TRV

Diameter: 2.6 cm, proximal segment

Visualization of proximal common iliac arteries obscured by
overlying bowel gas.
IMPRESSION: 1. No evidence of abdominal aortic aneurysm.

## 2016-05-08 ENCOUNTER — Other Ambulatory Visit: Payer: Self-pay | Admitting: Family Medicine

## 2016-05-08 DIAGNOSIS — Z1231 Encounter for screening mammogram for malignant neoplasm of breast: Secondary | ICD-10-CM

## 2016-05-17 ENCOUNTER — Ambulatory Visit (HOSPITAL_COMMUNITY)
Admission: RE | Admit: 2016-05-17 | Discharge: 2016-05-17 | Disposition: A | Discharge status: Home / Self Care | Payer: Medicare PPO | Source: Ambulatory Visit | Attending: Family Medicine | Admitting: Family Medicine

## 2016-05-17 DIAGNOSIS — Z1231 Encounter for screening mammogram for malignant neoplasm of breast: Secondary | ICD-10-CM | POA: Diagnosis not present

## 2016-05-17 DIAGNOSIS — R928 Other abnormal and inconclusive findings on diagnostic imaging of breast: Secondary | ICD-10-CM | POA: Insufficient documentation

## 2016-05-23 ENCOUNTER — Other Ambulatory Visit: Payer: Self-pay | Admitting: Family Medicine

## 2016-05-23 DIAGNOSIS — R928 Other abnormal and inconclusive findings on diagnostic imaging of breast: Secondary | ICD-10-CM

## 2016-05-27 DIAGNOSIS — E1122 Type 2 diabetes mellitus with diabetic chronic kidney disease: Secondary | ICD-10-CM | POA: Diagnosis not present

## 2016-05-27 DIAGNOSIS — E1142 Type 2 diabetes mellitus with diabetic polyneuropathy: Secondary | ICD-10-CM | POA: Diagnosis not present

## 2016-05-27 DIAGNOSIS — E669 Obesity, unspecified: Secondary | ICD-10-CM | POA: Diagnosis not present

## 2016-05-27 DIAGNOSIS — N189 Chronic kidney disease, unspecified: Secondary | ICD-10-CM | POA: Diagnosis not present

## 2016-05-27 DIAGNOSIS — E1165 Type 2 diabetes mellitus with hyperglycemia: Secondary | ICD-10-CM | POA: Diagnosis not present

## 2016-05-27 DIAGNOSIS — I129 Hypertensive chronic kidney disease with stage 1 through stage 4 chronic kidney disease, or unspecified chronic kidney disease: Secondary | ICD-10-CM | POA: Diagnosis not present

## 2016-05-27 DIAGNOSIS — E785 Hyperlipidemia, unspecified: Secondary | ICD-10-CM | POA: Diagnosis not present

## 2016-05-27 DIAGNOSIS — Z6831 Body mass index (BMI) 31.0-31.9, adult: Secondary | ICD-10-CM | POA: Diagnosis not present

## 2016-05-27 DIAGNOSIS — Z7984 Long term (current) use of oral hypoglycemic drugs: Secondary | ICD-10-CM | POA: Diagnosis not present

## 2016-05-29 ENCOUNTER — Ambulatory Visit
Admission: RE | Admit: 2016-05-29 | Discharge: 2016-05-29 | Disposition: A | Discharge status: Home / Self Care | Payer: Medicare PPO | Source: Ambulatory Visit | Attending: Family Medicine | Admitting: Family Medicine

## 2016-05-29 DIAGNOSIS — R928 Other abnormal and inconclusive findings on diagnostic imaging of breast: Secondary | ICD-10-CM

## 2016-05-29 DIAGNOSIS — N63 Unspecified lump in breast: Secondary | ICD-10-CM | POA: Diagnosis not present

## 2016-06-03 ENCOUNTER — Other Ambulatory Visit: Payer: Self-pay | Admitting: Family Medicine

## 2016-06-05 ENCOUNTER — Other Ambulatory Visit: Payer: Self-pay | Admitting: Family Medicine

## 2016-06-17 ENCOUNTER — Other Ambulatory Visit: Payer: Self-pay | Admitting: Family Medicine

## 2016-07-17 ENCOUNTER — Other Ambulatory Visit: Payer: Self-pay | Admitting: Family Medicine

## 2016-07-29 ENCOUNTER — Other Ambulatory Visit: Payer: Self-pay | Admitting: Family Medicine

## 2016-07-29 DIAGNOSIS — N6001 Solitary cyst of right breast: Secondary | ICD-10-CM

## 2016-08-02 ENCOUNTER — Other Ambulatory Visit: Payer: Self-pay | Admitting: Family Medicine

## 2016-08-14 ENCOUNTER — Encounter: Payer: Medicare PPO | Admitting: Family Medicine

## 2016-08-21 ENCOUNTER — Telehealth: Payer: Self-pay

## 2016-08-21 DIAGNOSIS — E118 Type 2 diabetes mellitus with unspecified complications: Secondary | ICD-10-CM | POA: Diagnosis not present

## 2016-08-21 DIAGNOSIS — I10 Essential (primary) hypertension: Secondary | ICD-10-CM

## 2016-08-21 NOTE — Telephone Encounter (Signed)
Called and left voicemail for patient notifying that lab orders have been placed and she should have those done 2 days before visit.

## 2016-08-22 LAB — COMPLETE METABOLIC PANEL WITH GFR
ALBUMIN: 4.5 g/dL (ref 3.6–5.1)
ALK PHOS: 63 U/L (ref 33–130)
ALT: 19 U/L (ref 6–29)
AST: 16 U/L (ref 10–35)
BUN: 16 mg/dL (ref 7–25)
CHLORIDE: 104 mmol/L (ref 98–110)
CO2: 26 mmol/L (ref 20–31)
CREATININE: 0.75 mg/dL (ref 0.50–0.99)
Calcium: 9.6 mg/dL (ref 8.6–10.4)
GFR, Est African American: >89 mL/min (ref >=60)
GFR, Est Non African American: 83 mL/min (ref >=60)
GLUCOSE: 100 mg/dL — AB (ref 65–99)
POTASSIUM: 3.9 mmol/L (ref 3.5–5.3)
SODIUM: 143 mmol/L (ref 135–146)
Total Bilirubin: 0.3 mg/dL (ref 0.2–1.2)
Total Protein: 7.1 g/dL (ref 6.1–8.1)

## 2016-08-23 LAB — HEMOGLOBIN A1C
Hgb A1c MFr Bld: 6.4 % — ABNORMAL HIGH (ref <5.7)
Mean Plasma Glucose: 137 mg/dL

## 2016-08-26 ENCOUNTER — Telehealth: Payer: Self-pay | Admitting: Family Medicine

## 2016-08-26 ENCOUNTER — Ambulatory Visit (INDEPENDENT_AMBULATORY_CARE_PROVIDER_SITE_OTHER): Payer: Medicare PPO

## 2016-08-26 VITALS — BP 148/88 | HR 80 | Resp 18 | Ht 69.0 in | Wt 211.0 lb

## 2016-08-26 DIAGNOSIS — Z0000 Encounter for general adult medical examination without abnormal findings: Secondary | ICD-10-CM | POA: Diagnosis not present

## 2016-08-26 DIAGNOSIS — E559 Vitamin D deficiency, unspecified: Secondary | ICD-10-CM

## 2016-08-26 DIAGNOSIS — E785 Hyperlipidemia, unspecified: Secondary | ICD-10-CM

## 2016-08-26 DIAGNOSIS — I10 Essential (primary) hypertension: Secondary | ICD-10-CM

## 2016-08-26 DIAGNOSIS — E118 Type 2 diabetes mellitus with unspecified complications: Secondary | ICD-10-CM

## 2016-08-26 MED ORDER — PANTOPRAZOLE SODIUM 40 MG PO TBEC: 40.0000 mg | DELAYED_RELEASE_TABLET | Freq: Every day | ORAL | 1 refills | Status: DC

## 2016-08-26 NOTE — Telephone Encounter (Signed)
Pt needs microalb, fasting lipid, cmp and eGFR, hBA1C, tSH, vit D and cBC for f/u visit in 4 months.  Pls remind her eye exam is past du and enter referral , I will sign, thanks!

## 2016-08-26 NOTE — Progress Notes (Signed)
Subjective:    Sophia [name] is a 65 y.o. female who presents for Medicare Annual/Subsequent preventive examination.  Preventive Screening-Counseling & Management  Tobacco History  Smoking Status  . Former Smoker  . Packs/day: 0.25  . Years: 20.00  . Types: Cigarettes  . Quit date: 01/05/1990  Smokeless Tobacco  . Current User  . Types: Snuff      Current Problems (verified) Patient Active Problem List   Diagnosis Date Noted  . Abdominal pain, left lower quadrant 03/31/2016  . Acute cystitis with hematuria 03/25/2016  . Tubular adenoma of colon 04/28/2014  . Chewing tobacco nicotine dependence, uncomplicated [id number]  . History of rectal bleeding 02/19/2014  . Back pain with radiation 10/05/2013  . Onychomycosis 02/25/2012  . Seasonal allergies 05/01/2011  . Type II diabetes mellitus ([place]) 11/28/2010  . Obesity (BMI 30-39.9) 02/24/2009  . Hyperlipidemia with target LDL less than 100 06/17/2008  . Depression with anxiety 11/21/2006  . Essential hypertension 11/21/2006  . GERD 11/21/2006    Medications Prior to Visit Current Outpatient Prescriptions on File Prior to Visit  Medication Sig Dispense Refill  . acetaminophen (TYLENOL) 325 MG tablet Take 325 mg by mouth every 6 (six) hours as needed for moderate pain.    . benazepril-hydrochlorthiazide (LOTENSIN HCT) 20-12.5 MG tablet TAKE 2 TABLETS EVERY DAY 180 tablet 1  . busPIRone (BUSPAR) 5 MG tablet Take 1 tablet (5 mg total) by mouth 3 (three) times daily. 270 tablet 2  . calcium-vitamin D (OSCAL WITH D) 500-200 MG-UNIT per tablet Take 1 tablet by mouth daily with breakfast.    . CARTIA XT 180 MG 24 hr capsule TAKE 1 CAPSULE (180 MG TOTAL) BY MOUTH DAILY. 90 capsule 1  . FLUoxetine (PROZAC) 40 MG capsule TAKE 1 CAPSULE EVERY DAY 90 capsule 1  . fluticasone (FLONASE) 50 MCG/ACT nasal spray Place 2 sprays into both nostrils daily. 48 g 2  . gabapentin (NEURONTIN) 100 MG capsule TAKE 1 CAPSULE THREE TIMES DAILY 270  capsule 0  . glucose blood (MEIJER TRUETEST TEST) test strip Use as instructed once daily testing 100 each 2  . MEIJER LANCETS MISC Use as directed with TrueResult meter 100 each 1  . Multiple Vitamin (MULTIVITAMIN WITH MINERALS) TABS tablet Take 1 tablet by mouth daily.    . Omega-3 Fatty Acids (FISH OIL) 1000 MG CAPS Take 1 capsule by mouth daily.    . pravastatin (PRAVACHOL) 40 MG tablet TAKE 1 TABLET EVERY EVENING 90 tablet 0  . ranitidine (ZANTAC) 300 MG capsule Take 1 capsule (300 mg total) by mouth every evening. 90 capsule 2  . temazepam (RESTORIL) 15 MG capsule Take 1 capsule (15 mg total) by mouth at bedtime as needed for sleep. 90 capsule 0  . vitamin C (ASCORBIC ACID) 500 MG tablet Take 500 mg by mouth daily.     No current facility-administered medications on file prior to visit.     Current Medications (verified) Current Outpatient Prescriptions  Medication Sig Dispense Refill  . acetaminophen (TYLENOL) 325 MG tablet Take 325 mg by mouth every 6 (six) hours as needed for moderate pain.    . benazepril-hydrochlorthiazide (LOTENSIN HCT) 20-12.5 MG tablet TAKE 2 TABLETS EVERY DAY 180 tablet 1  . busPIRone (BUSPAR) 5 MG tablet Take 1 tablet (5 mg total) by mouth 3 (three) times daily. 270 tablet 2  . calcium-vitamin D (OSCAL WITH D) 500-200 MG-UNIT per tablet Take 1 tablet by mouth daily with breakfast.    . CARTIA XT 180  MG 24 hr capsule TAKE 1 CAPSULE (180 MG TOTAL) BY MOUTH DAILY. 90 capsule 1  . FLUoxetine (PROZAC) 40 MG capsule TAKE 1 CAPSULE EVERY DAY 90 capsule 1  . fluticasone (FLONASE) 50 MCG/ACT nasal spray Place 2 sprays into both nostrils daily. 48 g 2  . gabapentin (NEURONTIN) 100 MG capsule TAKE 1 CAPSULE THREE TIMES DAILY 270 capsule 0  . glucose blood (MEIJER TRUETEST TEST) test strip Use as instructed once daily testing 100 each 2  . MEIJER LANCETS MISC Use as directed with TrueResult meter 100 each 1  . Multiple Vitamin (MULTIVITAMIN WITH MINERALS) TABS tablet  Take 1 tablet by mouth daily.    . Omega-3 Fatty Acids (FISH OIL) 1000 MG CAPS Take 1 capsule by mouth daily.    . pantoprazole (PROTONIX) 40 MG tablet Take 1 tablet (40 mg total) by mouth daily. 90 tablet 1  . pravastatin (PRAVACHOL) 40 MG tablet TAKE 1 TABLET EVERY EVENING 90 tablet 0  . ranitidine (ZANTAC) 300 MG capsule Take 1 capsule (300 mg total) by mouth every evening. 90 capsule 2  . temazepam (RESTORIL) 15 MG capsule Take 1 capsule (15 mg total) by mouth at bedtime as needed for sleep. 90 capsule 0  . vitamin C (ASCORBIC ACID) 500 MG tablet Take 500 mg by mouth daily.     No current facility-administered medications for this visit.      Allergies (verified) Review of patient's allergies indicates no known allergies.   PAST HISTORY  Family History Family History  Problem Relation Age of Onset  . Colon polyps Sister 72  . Diabetes Sister   . Hypertension Sister   . Kidney disease Sister   . Colon cancer Neg Hx   . Arthritis Father   . Cancer Father     prostate   . Hypertension Father   . Diabetes Sister   . Hypertension Sister     Social History Social History  Substance Use Topics  . Smoking status: Former Smoker    Packs/day: 0.25    Years: 20.00    Types: Cigarettes    Quit date: 01/05/1990  . Smokeless tobacco: Current User    Types: Snuff  . Alcohol use No     Are there smokers in your home (other than you)? No  Risk Factors Current exercise habits: The patient has a physically strenuous job, but has no regular exercise apart from work.   Dietary issues discussed: Reviewed changes with food labels, carb counting, and changes with food portions    Cardiac risk factors: dyslipidemia, hypertension, obesity (BMI >= 30 kg/m2) and smoking/ tobacco exposure.  Depression Screen (Note: if answer to either of the following is "Yes", a more complete depression screening is indicated)   Over the past two weeks, have you felt down, depressed or hopeless?  No  Over the past two weeks, have you felt little interest or pleasure in doing things? No  Have you lost interest or pleasure in daily life? No  Do you often feel hopeless? No  Do you cry easily over simple problems? No  Activities of Daily Living In your present state of health, do you have any difficulty performing the following activities?:  Driving? No Managing money?  No Feeding yourself? No Getting from bed to chair? No Climbing a flight of stairs? No Preparing food and eating?: No Bathing or showering? No Getting dressed: No Getting to the toilet? No Using the toilet:No Moving around from place to place: No In the  past year have you fallen or had a near fall?:No   Are you sexually active?  Yes  Do you have more than one partner?  No  Hearing Difficulties: No Do you often ask people to speak up or repeat themselves? No Do you experience ringing or noises in your ears? No Do you have difficulty understanding soft or whispered voices? No   Do you feel that you have a problem with memory? No  Do you often misplace items? No  Do you feel safe at home?  Yes  Cognitive Testing  Alert? Yes  Normal Appearance?Yes  Oriented to person? Yes  Place? Yes   Time? Yes  Recall of three objects?  Yes  Can perform simple calculations? Yes  Displays appropriate judgment?Yes  Can read the correct time from a watch face?Yes   Advanced Directives have been discussed with the patient? No  List the Names of Other Physician/Practitioners you currently use: 1.  Dr. Gershon Crane (opthamology)    Indicate any recent Medical Services you may have received from other than [facility] providers in the past year (date may be approximate).  Immunization History  Administered Date(s) Administered  . Influenza Split 08/19/2014  . Influenza Whole 09/20/1996  . Influenza,inj,Quad PF,36+ Mos 09/15/2015, 08/06/2016  . Pneumococcal Conjugate-13 01/25/2015  . Pneumococcal Polysaccharide-23 11/28/2010,  03/25/2016  . Tdap 04/29/2014  . Zoster 11/28/2010    Screening Tests Health Maintenance  Topic Date Due  . FOOT EXAM  06/30/2016  . OPHTHALMOLOGY EXAM  07/31/2016  . HEMOGLOBIN A1C  02/19/2017  . MAMMOGRAM  05/17/2018  . COLONOSCOPY  04/23/2019  . TETANUS/TDAP  04/29/2024  . INFLUENZA VACCINE  Completed  . DEXA SCAN  Completed  . ZOSTAVAX  Completed  . Hepatitis C Screening  Completed  . PNA vac Low Risk Adult  Completed    All answers were reviewed with the patient and necessary referrals were made:  Vanetta Mulders, LPN   [id number]   History reviewed: allergies, current medications, past family history, past medical history, past social history, past surgical history and problem list  Review of Systems A comprehensive review of systems was negative.    Objective:     Vision by Snellen chart: right eye:20/20, left eye:20/20  Body mass index is 31.16 kg/m. BP (!) 148/88   Pulse 80   Resp 18   Ht 5\' 9"  (1.753 m)   Wt 211 lb (95.7 kg)   SpO2 94%   BMI 31.16 kg/m   No exam performed today, annual wellness without physical exam.     Assessment:   Medicare annual wellness visit, subsequent Annual exam as documented. Counseling done  re healthy lifestyle involving commitment to 150 minutes exercise per week, heart healthy diet, and attaining healthy weight.The importance of adequate sleep also discussed. Regular seat belt use and home safety, is also discussed. Changes in health habits are decided on by the patient with goals and time frames  set for achieving them. Immunization and cancer screening needs are specifically addressed at this visit.      Plan:     During the course of the visit the patient was educated and counseled about appropriate screening and preventive services including:    Nutrition counseling   Diet review for nutrition referral? Yes ____  Not Indicated __x__   Patient Instructions (the written plan) was given to the  patient.  Medicare Attestation I have personally reviewed: The patient's medical and social history Their use of alcohol, tobacco or  illicit drugs Their current medications and supplements The patient's functional ability including ADLs,fall risks, home safety risks, cognitive, and hearing and visual impairment Diet and physical activities Evidence for depression or mood disorders  The patient's weight, height, BMI, and visual acuity have been recorded in the chart.  I have made referrals, counseling, and provided education to the patient based on review of the above and I have provided the patient with a written personalized care plan for preventive services.     Denman George [place], [place]   [id number]

## 2016-08-26 NOTE — Patient Instructions (Signed)
Thank you for choosing [facility] for your health care needs  The Annual Wellness Visit is designed to allow [place] the chance to assist you in preserving and improving you health.   Dr. Moshe Cipro will see you back in 4 months  If labs are needed they will be mailed to you with the date to have them done  If you have any concerns please don't hesitate to call.  The new # is [phone]

## 2016-08-26 NOTE — Assessment & Plan Note (Signed)

## 2016-08-26 NOTE — Addendum Note (Signed)
Addended by: Denman George B on: 08/26/2016 02:46 PM   Modules accepted: Orders

## 2016-08-26 NOTE — Telephone Encounter (Signed)
Labs ordered.  Will mail to patient to have done before next visit.    Will contact patient about eye referral.

## 2016-08-30 ENCOUNTER — Ambulatory Visit
Admission: RE | Admit: 2016-08-30 | Discharge: 2016-08-30 | Disposition: A | Discharge status: Home / Self Care | Payer: Medicare PPO | Source: Ambulatory Visit | Attending: Family Medicine | Admitting: Family Medicine

## 2016-08-30 ENCOUNTER — Other Ambulatory Visit: Payer: Self-pay | Admitting: Family Medicine

## 2016-08-30 DIAGNOSIS — N6001 Solitary cyst of right breast: Secondary | ICD-10-CM | POA: Diagnosis not present

## 2016-08-30 DIAGNOSIS — N6310 Unspecified lump in the right breast, unspecified quadrant: Secondary | ICD-10-CM | POA: Diagnosis not present

## 2016-11-19 ENCOUNTER — Other Ambulatory Visit: Payer: Self-pay | Admitting: Family Medicine

## 2016-11-19 DIAGNOSIS — Z961 Presence of intraocular lens: Secondary | ICD-10-CM | POA: Diagnosis not present

## 2016-11-19 DIAGNOSIS — F418 Other specified anxiety disorders: Secondary | ICD-10-CM

## 2016-11-19 LAB — HM DIABETES EYE EXAM

## 2016-11-25 ENCOUNTER — Telehealth: Payer: Self-pay | Admitting: Family Medicine

## 2016-11-25 NOTE — Telephone Encounter (Signed)
error 

## 2016-12-06 ENCOUNTER — Other Ambulatory Visit: Payer: Self-pay | Admitting: Family Medicine

## 2016-12-25 DIAGNOSIS — I10 Essential (primary) hypertension: Secondary | ICD-10-CM | POA: Diagnosis not present

## 2016-12-25 DIAGNOSIS — E559 Vitamin D deficiency, unspecified: Secondary | ICD-10-CM | POA: Diagnosis not present

## 2016-12-25 DIAGNOSIS — E785 Hyperlipidemia, unspecified: Secondary | ICD-10-CM | POA: Diagnosis not present

## 2016-12-25 DIAGNOSIS — E118 Type 2 diabetes mellitus with unspecified complications: Secondary | ICD-10-CM | POA: Diagnosis not present

## 2016-12-25 LAB — COMPLETE METABOLIC PANEL WITH GFR
ALT: 25 U/L (ref 6–29)
AST: 18 U/L (ref 10–35)
Albumin: 4.4 g/dL (ref 3.6–5.1)
Alkaline Phosphatase: 63 U/L (ref 33–130)
BUN: 16 mg/dL (ref 7–25)
CHLORIDE: 102 mmol/L (ref 98–110)
CO2: 31 mmol/L (ref 20–31)
CREATININE: 0.86 mg/dL (ref 0.50–0.99)
Calcium: 9.5 mg/dL (ref 8.6–10.4)
GFR, Est African American: 81 mL/min (ref >=60)
GFR, Est Non African American: 71 mL/min (ref >=60)
GLUCOSE: 105 mg/dL — AB (ref 65–99)
Potassium: 3.9 mmol/L (ref 3.5–5.3)
SODIUM: 143 mmol/L (ref 135–146)
TOTAL PROTEIN: 7.3 g/dL (ref 6.1–8.1)
Total Bilirubin: 0.3 mg/dL (ref 0.2–1.2)

## 2016-12-25 LAB — CBC
HCT: 39.5 % (ref 35.0–45.0)
Hemoglobin: 12.3 g/dL (ref 11.7–15.5)
MCH: 25.3 pg — ABNORMAL LOW (ref 27.0–33.0)
MCHC: 31.1 g/dL — ABNORMAL LOW (ref 32.0–36.0)
MCV: 81.3 fL (ref 80.0–100.0)
MPV: 10.4 fL (ref 7.5–12.5)
PLATELETS: 208 10*3/uL (ref 140–400)
RBC: 4.86 MIL/uL (ref 3.80–5.10)
RDW: 16 % — AB (ref 11.0–15.0)
WBC: 4.8 10*3/uL (ref 3.8–10.8)

## 2016-12-25 LAB — LIPID PANEL
Cholesterol: 176 mg/dL (ref <200)
HDL: 59 mg/dL (ref >50)
LDL Cholesterol: 94 mg/dL (ref <100)
Total CHOL/HDL Ratio: 3 Ratio (ref <5.0)
Triglycerides: 114 mg/dL (ref <150)
VLDL: 23 mg/dL (ref <30)

## 2016-12-26 LAB — HEMOGLOBIN A1C
Hgb A1c MFr Bld: 6.2 % — ABNORMAL HIGH (ref <5.7)
Mean Plasma Glucose: 131 mg/dL

## 2016-12-26 LAB — MICROALBUMIN / CREATININE URINE RATIO
Creatinine, Urine: 197 mg/dL (ref 20–320)
MICROALB UR: 13.8 mg/dL
MICROALB/CREAT RATIO: 70 ug/mg{creat} — AB (ref <30)

## 2016-12-26 LAB — VITAMIN D 25 HYDROXY (VIT D DEFICIENCY, FRACTURES): Vit D, 25-Hydroxy: 42 ng/mL (ref 30–100)

## 2016-12-26 LAB — TSH: TSH: 1.55 mIU/L

## 2017-01-01 ENCOUNTER — Encounter: Payer: Self-pay | Admitting: Family Medicine

## 2017-01-01 ENCOUNTER — Ambulatory Visit (INDEPENDENT_AMBULATORY_CARE_PROVIDER_SITE_OTHER): Payer: Medicare PPO | Admitting: Family Medicine

## 2017-01-01 VITALS — BP 122/80 | HR 88 | Resp 15 | Ht 69.0 in | Wt 211.8 lb

## 2017-01-01 DIAGNOSIS — I10 Essential (primary) hypertension: Secondary | ICD-10-CM | POA: Diagnosis not present

## 2017-01-01 DIAGNOSIS — E1169 Type 2 diabetes mellitus with other specified complication: Secondary | ICD-10-CM

## 2017-01-01 DIAGNOSIS — E785 Hyperlipidemia, unspecified: Secondary | ICD-10-CM

## 2017-01-01 DIAGNOSIS — F418 Other specified anxiety disorders: Secondary | ICD-10-CM | POA: Diagnosis not present

## 2017-01-01 DIAGNOSIS — B351 Tinea unguium: Secondary | ICD-10-CM

## 2017-01-01 DIAGNOSIS — J3089 Other allergic rhinitis: Secondary | ICD-10-CM

## 2017-01-01 DIAGNOSIS — F17220 Nicotine dependence, chewing tobacco, uncomplicated: Secondary | ICD-10-CM

## 2017-01-01 DIAGNOSIS — E669 Obesity, unspecified: Secondary | ICD-10-CM

## 2017-01-01 MED ORDER — FLUTICASONE PROPIONATE 50 MCG/ACT NA SUSP: 2.0000 | Freq: Every day | NASAL | 2 refills | Status: DC

## 2017-01-01 MED ORDER — PANTOPRAZOLE SODIUM 40 MG PO TBEC: 40.0000 mg | DELAYED_RELEASE_TABLET | Freq: Every day | ORAL | 1 refills | Status: DC

## 2017-01-01 MED ORDER — DILTIAZEM HCL ER COATED BEADS 180 MG PO CP24: ORAL_CAPSULE | ORAL | 1 refills | Status: DC

## 2017-01-01 MED ORDER — BENAZEPRIL-HYDROCHLOROTHIAZIDE 20-12.5 MG PO TABS: 2.0000 | ORAL_TABLET | Freq: Every day | ORAL | 1 refills | Status: DC

## 2017-01-01 MED ORDER — TERBINAFINE HCL 250 MG PO TABS: 250.0000 mg | ORAL_TABLET | Freq: Every day | ORAL | 0 refills | Status: DC

## 2017-01-01 NOTE — Progress Notes (Signed)
Sophia [name]     MRN: [id number]      DOB: [birth year]   HPI Sophia [name] is here for follow up and re-evaluation of chronic medical conditions, medication management and review of any available recent lab and radiology data.  Preventive health is updated, specifically  Cancer screening and Immunization.    The PT denies any adverse reactions to current medications since the last visit.  There are no new concerns.  There are no specific complaints   ROS Denies recent fever or chills. Denies sinus pressure, nasal congestion, ear pain or sore throat. Denies chest congestion, productive cough or wheezing. Denies chest pains, palpitations and leg swelling Denies abdominal pain, nausea, vomiting,diarrhea or constipation.   Denies dysuria, frequency, hesitancy or incontinence. Denies joint pain, swelling and limitation in mobility. Denies headaches, seizures, numbness, or tingling. Denies depression, anxiety or insomnia. Denies skin break down or rash.   PE  BP 122/80   Pulse 88   Resp 15   Ht 5\' 9"  (1.753 m)   Wt 211 lb 12.8 oz (96.1 kg)   BMI 31.28 kg/m   Patient alert and oriented and in no cardiopulmonary distress.  HEENT: No facial asymmetry, EOMI,   oropharynx pink and moist.  Neck supple no JVD, no mass.  Chest: Clear to auscultation bilaterally.  CVS: S1, S2 no murmurs, no S3.Regular rate.  ABD: Soft non tender.   Ext: No edema  MS: Adequate ROM spine, shoulders, hips and knees.  Skin: Intact, no ulcerations or rash noted.  Psych: Good eye contact, normal affect. Memory intact not anxious or depressed appearing.  CNS: CN 2-12 intact, power,  normal throughout.no focal deficits noted.   Assessment & Plan  Essential hypertension Controlled, no change in medication DASH diet and commitment to daily physical activity for a minimum of 30 minutes discussed and encouraged, as a part of hypertension management. The importance of attaining a healthy weight is also  discussed.  BP/Weight 01/01/2017 08/26/2016 03/25/2016 11/20/2015 11/14/2015 [id number]  Systolic BP 123XX123 123456 123XX123 123456 [id number]  Diastolic BP 80 88 84 75 78 61 84  Wt. (Lbs) 211.8 211 211 215 220 - 216  BMI 31.28 31.16 31.15 31.74 34.45 - 33.82       Onychomycosis Bilateral great toe infections, daily terbinafine  Depression with anxiety Controlled, no change in medication   Diabetes mellitus type 2 in obese [facility]) Improved Sophia [name] is reminded of the importance of commitment to daily physical activity for 30 minutes or more, as able and the need to limit carbohydrate intake to 30 to 60 grams per meal to help with blood sugar control.    Sophia [name] is reminded of the importance of daily foot exam, annual eye examination, and good blood sugar, blood pressure and cholesterol control.  Diabetic Labs Latest Ref Rng & Units 12/25/2016 08/21/2016 03/20/2016 11/20/2015 11/07/2015  HbA1c <5.7 % 6.2(H) 6.4(H) 6.6(H) - 6.8(H)  Microalbumin Not estab mg/dL 13.8 - - - -  Micro/Creat Ratio <30 mcg/mg creat 70(H) - - - -  Chol <200 mg/dL 176 - 134 - 158  HDL >50 mg/dL 59 - 52 - 51  Calc LDL <100 mg/dL 94 - 58 - 77  Triglycerides <150 mg/dL 114 - 120 - 151(H)  Creatinine 0.50 - 0.99 mg/dL 0.86 0.75 0.84 0.75 -   BP/Weight 01/01/2017 08/26/2016 03/25/2016 11/20/2015 11/14/2015 [id number]  Systolic BP 123XX123 123456 123XX123 123456 [id number] [id number]  Diastolic BP 80 88 84 75  78 61 84  Wt. (Lbs) 211.8 211 211 215 220 - 216  BMI 31.28 31.16 31.15 31.74 34.45 - 33.82   Foot/eye exam completion dates Latest Ref Rng & Units 01/01/2017 11/19/2016  Eye Exam No Retinopathy - No Retinopathy  Foot Form Completion - Done -        Chewing tobacco nicotine dependence, uncomplicated Patient counseled for approximately 5 minutes regarding the health risks of ongoing nicotine use, specifically all types of cancer, heart disease, stroke and respiratory failure. The options available for help with cessation ,the behavioral  changes to assist the process, and the option to either gradully reduce usage  Or abruptly stop.is also discussed. Pt is also encouraged to set specific goals in number of cigarettes used daily, as well as to set a quit date.     Hyperlipidemia with target LDL less than 100 Hyperlipidemia:Low fat diet discussed and encouraged.   Lipid Panel  Lab Results  Component Value Date   CHOL 176 12/25/2016   HDL 59 12/25/2016   LDLCALC 94 12/25/2016   TRIG 114 12/25/2016   CHOLHDL 3.0 12/25/2016  Controlled, no change in medication      Seasonal allergies Controlled, no change in medication   Obesity (BMI 30-39.9) Unchanged Patient re-educated about  the importance of commitment to a  minimum of 150 minutes of exercise per week.  The importance of healthy food choices with portion control discussed. Encouraged to start a food diary, count calories and to consider  joining a support group. Sample diet sheets offered. Goals set by the patient for the next several months.   Weight /BMI 01/01/2017 08/26/2016 03/25/2016  WEIGHT 211 lb 12.8 oz 211 lb 211 lb  HEIGHT 5\' 9"  5\' 9"  5\' 9"   BMI 31.28 kg/m2 31.16 kg/m2 31.15 kg/m2

## 2017-01-01 NOTE — Assessment & Plan Note (Signed)
Hyperlipidemia:Low fat diet discussed and encouraged.   Lipid Panel  Lab Results  Component Value Date   CHOL 176 12/25/2016   HDL 59 12/25/2016   LDLCALC 94 12/25/2016   TRIG 114 12/25/2016   CHOLHDL 3.0 12/25/2016   Controlled, no change in medication    

## 2017-01-01 NOTE — Assessment & Plan Note (Signed)
Controlled, no change in medication  

## 2017-01-01 NOTE — Assessment & Plan Note (Signed)
Unchanged Patient re-educated about  the importance of commitment to a  minimum of 150 minutes of exercise per week.  The importance of healthy food choices with portion control discussed. Encouraged to start a food diary, count calories and to consider  joining a support group. Sample diet sheets offered. Goals set by the patient for the next several months.   Weight /BMI 01/01/2017 08/26/2016 03/25/2016  WEIGHT 211 lb 12.8 oz 211 lb 211 lb  HEIGHT 5\' 9"  5\' 9"  5\' 9"   BMI 31.28 kg/m2 31.16 kg/m2 31.15 kg/m2

## 2017-01-01 NOTE — Assessment & Plan Note (Signed)
Controlled, no change in medication DASH diet and commitment to daily physical activity for a minimum of 30 minutes discussed and encouraged, as a part of hypertension management. The importance of attaining a healthy weight is also discussed.  BP/Weight 01/01/2017 08/26/2016 03/25/2016 11/20/2015 11/14/2015 [id number]  Systolic BP 123XX123 123456 [id number] 123456 [id number] [id number]  Diastolic BP 80 88 84 75 78 61 84  Wt. (Lbs) 211.8 211 211 215 220 - 216  BMI 31.28 31.16 31.15 31.74 34.45 - 33.82

## 2017-01-01 NOTE — Assessment & Plan Note (Signed)
Bilateral great toe infections, daily terbinafine

## 2017-01-01 NOTE — Patient Instructions (Addendum)
Annual physical exam in 5.5 months, call if you need me before  Congrats on excellent labs   NO MORE SNUFF PLEASE, QUIT DATE SET  June 6!!  12 weeks tablets one daily for nail infexction  cmp and EGFR and HBA1C non fasting in 5.5 month  Thank you  for choosing [facility]. We consider it a privelige to serve you.  Delivering excellent health care in a caring and  compassionate way is our goal.  Partnering with you,  so that together we can achieve this goal is our strategy.   Please work on good  health habits so that your health will improve. 1. Commitment to daily physical activity for 30 to 60  minutes, if you are able to do this.  2. Commitment to wise food choices. Aim for half of your  food intake to be vegetable and fruit, one quarter starchy foods, and one quarter protein. Try to eat on a regular schedule  3 meals per day, snacking between meals should be limited to vegetables or fruits or small portions of nuts. 64 ounces of water per day is generally recommended, unless you have specific health conditions, like heart failure or kidney failure where you will need to limit fluid intake.  3. Commitment to sufficient and a  good quality of physical and mental rest daily, generally between 6 to 8 hours per day.  WITH PERSISTANCE AND PERSEVERANCE, THE IMPOSSIBLE , BECOMES THE NORM!   Call in March for repeat breast imaging due in April

## 2017-01-01 NOTE — Assessment & Plan Note (Signed)

## 2017-01-01 NOTE — Assessment & Plan Note (Signed)
Improved Sophia [name] is reminded of the importance of commitment to daily physical activity for 30 minutes or more, as able and the need to limit carbohydrate intake to 30 to 60 grams per meal to help with blood sugar control.    Sophia [name] is reminded of the importance of daily foot exam, annual eye examination, and good blood sugar, blood pressure and cholesterol control.  Diabetic Labs Latest Ref Rng & Units 12/25/2016 08/21/2016 03/20/2016 11/20/2015 11/07/2015  HbA1c <5.7 % 6.2(H) 6.4(H) 6.6(H) - 6.8(H)  Microalbumin Not estab mg/dL 13.8 - - - -  Micro/Creat Ratio <30 mcg/mg creat 70(H) - - - -  Chol <200 mg/dL 176 - 134 - 158  HDL >50 mg/dL 59 - 52 - 51  Calc LDL <100 mg/dL 94 - 58 - 77  Triglycerides <150 mg/dL 114 - 120 - 151(H)  Creatinine 0.50 - 0.99 mg/dL 0.86 0.75 0.84 0.75 -   BP/Weight 01/01/2017 08/26/2016 03/25/2016 11/20/2015 11/14/2015 [id number]  Systolic BP 123XX123 123456 123XX123 123456 [id number] 123456  Diastolic BP 80 88 84 75 78 61 84  Wt. (Lbs) 211.8 211 211 215 220 - 216  BMI 31.28 31.16 31.15 31.74 34.45 - 33.82   Foot/eye exam completion dates Latest Ref Rng & Units 01/01/2017 11/19/2016  Eye Exam No Retinopathy - No Retinopathy  Foot Form Completion - Done -

## 2017-01-20 ENCOUNTER — Other Ambulatory Visit: Payer: Self-pay | Admitting: Family Medicine

## 2017-01-23 DIAGNOSIS — E119 Type 2 diabetes mellitus without complications: Secondary | ICD-10-CM | POA: Diagnosis not present

## 2017-01-24 ENCOUNTER — Encounter: Payer: Self-pay | Admitting: Family Medicine

## 2017-01-24 ENCOUNTER — Ambulatory Visit (INDEPENDENT_AMBULATORY_CARE_PROVIDER_SITE_OTHER): Payer: Medicare PPO | Admitting: Family Medicine

## 2017-01-24 VITALS — BP 186/94 | HR 67 | Temp 99.5°F | Ht 69.0 in | Wt 216.0 lb

## 2017-01-24 DIAGNOSIS — I10 Essential (primary) hypertension: Secondary | ICD-10-CM

## 2017-01-24 DIAGNOSIS — F418 Other specified anxiety disorders: Secondary | ICD-10-CM | POA: Diagnosis not present

## 2017-01-24 MED ORDER — BUSPIRONE HCL 10 MG PO TABS: 10.0000 mg | ORAL_TABLET | Freq: Three times a day (TID) | ORAL | 0 refills | Status: DC

## 2017-01-24 MED ORDER — FLUOXETINE HCL 20 MG PO TABS: 60.0000 mg | ORAL_TABLET | Freq: Every day | ORAL | 0 refills | Status: DC

## 2017-01-24 NOTE — Patient Instructions (Signed)
increase the fluoxetine to 60 mg a day Increase the buspirone to 10 mg three times a day  See [place] on Monday or Tuesday for BP check

## 2017-01-24 NOTE — Progress Notes (Signed)
Chief Complaint  Patient presents with  . Hypertension   Here as an acute walk in Has been having trouble with her BP Headache and slight dizzy feelings at home, so took Bp.  It is 170-200 over 100-110. No change in medicine No change in diet, no salt No change in OTC supplements or cold pills No change in sleep  ++ stress, worry, tearful,  Overwhelmed with family problems and recent death in family.   Patient Active Problem List   Diagnosis Date Noted  . Tubular adenoma of colon 04/28/2014  . Chewing tobacco nicotine dependence, uncomplicated 17/61/6073  . History of rectal bleeding 02/19/2014  . Back pain with radiation 10/05/2013  . Onychomycosis 02/25/2012  . Seasonal allergies 05/01/2011  . Diabetes mellitus type 2 in obese ([place]) 11/28/2010  . Obesity (BMI 30-39.9) 02/24/2009  . Hyperlipidemia with target LDL less than 100 06/17/2008  . Depression with anxiety 11/21/2006  . Essential hypertension 11/21/2006  . GERD 11/21/2006    Outpatient Encounter Prescriptions as of 01/24/2017  Medication Sig  . acetaminophen (TYLENOL) 325 MG tablet Take 325 mg by mouth every 6 (six) hours as needed for moderate pain.  . benazepril-hydrochlorthiazide (LOTENSIN HCT) 20-12.5 MG tablet Take 2 tablets by mouth daily.  . busPIRone (BUSPAR) 5 MG tablet Take 1 tablet (5 mg total) by mouth 3 (three) times daily.  . calcium-vitamin D (OSCAL WITH D) 500-200 MG-UNIT per tablet Take 1 tablet by mouth daily with breakfast.  . diltiazem (CARTIA XT) 180 MG 24 hr capsule TAKE 1 CAPSULE (180 MG TOTAL) BY MOUTH DAILY.  Marland Kitchen FLUoxetine (PROZAC) 40 MG capsule TAKE 1 CAPSULE EVERY DAY  . fluticasone (FLONASE) 50 MCG/ACT nasal spray Place 2 sprays into both nostrils daily.  Marland Kitchen gabapentin (NEURONTIN) 100 MG capsule TAKE 1 CAPSULE THREE TIMES DAILY  . MEIJER LANCETS MISC Use as directed with TrueResult meter  . Multiple Vitamin (MULTIVITAMIN WITH MINERALS) TABS tablet Take 1 tablet by mouth daily.  .  Omega-3 Fatty Acids (FISH OIL) 1000 MG CAPS Take 1 capsule by mouth daily.  . pantoprazole (PROTONIX) 40 MG tablet Take 1 tablet (40 mg total) by mouth daily.  . pravastatin (PRAVACHOL) 40 MG tablet TAKE 1 TABLET EVERY EVENING  . ranitidine (ZANTAC) 300 MG capsule Take 1 capsule (300 mg total) by mouth every evening.  . temazepam (RESTORIL) 15 MG capsule TAKE 1 CAPSULE AT BEDTIME AS NEEDED FOR SLEEP  . vitamin C (ASCORBIC ACID) 500 MG tablet Take 500 mg by mouth daily.  . busPIRone (BUSPAR) 10 MG tablet Take 1 tablet (10 mg total) by mouth 3 (three) times daily.  Marland Kitchen FLUoxetine (PROZAC) 20 MG tablet Take 3 tablets (60 mg total) by mouth daily.  Marland Kitchen terbinafine (LAMISIL) 250 MG tablet Take 1 tablet (250 mg total) by mouth daily. (Patient not taking: Reported on 01/24/2017)   No facility-administered encounter medications on file as of 01/24/2017.     No Known Allergies  Review of Systems  Constitutional: Positive for unexpected weight change. Negative for activity change and appetite change.       Gain 5 lbs  HENT: Negative for congestion, postnasal drip and rhinorrhea.   Eyes: Negative for photophobia and visual disturbance.  Respiratory: Negative for cough and shortness of breath.   Cardiovascular: Negative for chest pain, palpitations and leg swelling.  Gastrointestinal: Negative for nausea and vomiting.  Neurological: Positive for dizziness and headaches.  Psychiatric/Behavioral: Positive for decreased concentration and dysphoric mood. The patient is nervous/anxious.  BP (!) 186/94 (BP Location: Right Arm, Patient Position: Supine, Cuff Size: Large)   Pulse 67   Temp 99.5 F (37.5 C)   Ht 5\' 9"  (1.753 m)   Wt 216 lb 0.6 oz (98 kg)   SpO2 98%   BMI 31.90 kg/m   Physical Exam  Constitutional: She is oriented to person, place, and time. She appears well-developed and well-nourished.  HENT:  Head: Normocephalic and atraumatic.  Right Ear: External ear normal.  Left Ear: External  ear normal.  Mouth/Throat: Oropharynx is clear and moist.  Eyes: Conjunctivae are normal. Pupils are equal, round, and reactive to light.  Fundi calm - discs flat  Neck: Normal range of motion. Neck supple. No thyromegaly present.  Cardiovascular: Normal rate, regular rhythm and normal heart sounds.   Pulmonary/Chest: Effort normal and breath sounds normal. No respiratory distress.  Musculoskeletal: Normal range of motion. She exhibits no edema.  Lymphadenopathy:    She has no cervical adenopathy.  Neurological: She is alert and oriented to person, place, and time.  Gait normal  Skin: Skin is warm and dry.  Psychiatric: Her speech is normal and behavior is normal. Her mood appears anxious. Cognition and memory are normal. She exhibits a depressed mood.  Unaware that her stress is making her so upset - judgement off  Nursing note and vitals reviewed.   ASSESSMENT/PLAN:  1. Depression with anxiety She has been on fluoxetine 40 for years and it seems "not strong enough " right now.  Will increase.  also can do a higher does of buspirone if needed right now  2. Essential hypertension    Patient Instructions  increase the fluoxetine to 60 mg a day Increase the buspirone to 10 mg three times a day  See [place] on Monday or Tuesday for BP check   Raylene Everts, MD

## 2017-01-27 ENCOUNTER — Ambulatory Visit: Payer: Medicare PPO

## 2017-01-27 VITALS — BP 138/76 | HR 76

## 2017-01-27 DIAGNOSIS — I10 Essential (primary) hypertension: Secondary | ICD-10-CM

## 2017-02-03 DIAGNOSIS — M9903 Segmental and somatic dysfunction of lumbar region: Secondary | ICD-10-CM | POA: Diagnosis not present

## 2017-02-03 DIAGNOSIS — M9901 Segmental and somatic dysfunction of cervical region: Secondary | ICD-10-CM | POA: Diagnosis not present

## 2017-02-03 DIAGNOSIS — M542 Cervicalgia: Secondary | ICD-10-CM | POA: Diagnosis not present

## 2017-02-03 DIAGNOSIS — M5386 Other specified dorsopathies, lumbar region: Secondary | ICD-10-CM | POA: Diagnosis not present

## 2017-02-12 ENCOUNTER — Encounter: Payer: Self-pay | Admitting: Family Medicine

## 2017-02-12 ENCOUNTER — Ambulatory Visit (INDEPENDENT_AMBULATORY_CARE_PROVIDER_SITE_OTHER): Payer: Medicare PPO | Admitting: Family Medicine

## 2017-02-12 VITALS — BP 130/80 | HR 91 | Resp 15 | Ht 69.0 in | Wt 214.0 lb

## 2017-02-12 DIAGNOSIS — E669 Obesity, unspecified: Secondary | ICD-10-CM | POA: Diagnosis not present

## 2017-02-12 DIAGNOSIS — I10 Essential (primary) hypertension: Secondary | ICD-10-CM

## 2017-02-12 DIAGNOSIS — K219 Gastro-esophageal reflux disease without esophagitis: Secondary | ICD-10-CM

## 2017-02-12 DIAGNOSIS — E785 Hyperlipidemia, unspecified: Secondary | ICD-10-CM

## 2017-02-12 DIAGNOSIS — F418 Other specified anxiety disorders: Secondary | ICD-10-CM

## 2017-02-12 DIAGNOSIS — E1169 Type 2 diabetes mellitus with other specified complication: Secondary | ICD-10-CM

## 2017-02-12 DIAGNOSIS — J302 Other seasonal allergic rhinitis: Secondary | ICD-10-CM | POA: Diagnosis not present

## 2017-02-12 MED ORDER — LORATADINE 10 MG PO TABS: 10.0000 mg | ORAL_TABLET | Freq: Every day | ORAL | 1 refills | Status: DC | PRN

## 2017-02-12 MED ORDER — FLUOXETINE HCL 60 MG PO TABS: ORAL_TABLET | ORAL | 1 refills | Status: DC

## 2017-02-12 NOTE — Patient Instructions (Addendum)
f/u as before, call if you need me sooner New is fluoxetine 60 mg one daily  New is loratadine one daily as needed for allergies  Be honest with yourself and with the poeople you care about  Excellent blood pressure no change in medication needed  Thank you  for choosing [facility]. We consider it a privelige to serve you.  Delivering excellent health care in a caring and  compassionate way is our goal.  Partnering with you,  so that together we can achieve this goal is our strategy.   Please work on good  health habits so that your health will improve. 1. Commitment to daily physical activity for 30 to 60  minutes, if you are able to do this.  2. Commitment to wise food choices. Aim for half of your  food intake to be vegetable and fruit, one quarter starchy foods, and one quarter protein. Try to eat on a regular schedule  3 meals per day, snacking between meals should be limited to vegetables or fruits or small portions of nuts. 64 ounces of water per day is generally recommended, unless you have specific health conditions, like heart failure or kidney failure where you will need to limit fluid intake.  3. Commitment to sufficient and a  good quality of physical and mental rest daily, generally between 6 to 8 hours per day.  WITH PERSISTANCE AND PERSEVERANCE, THE IMPOSSIBLE , BECOMES THE NORM!

## 2017-02-17 NOTE — Assessment & Plan Note (Signed)
Controlled, no change in medication DASH diet and commitment to daily physical activity for a minimum of 30 minutes discussed and encouraged, as a part of hypertension management. The importance of attaining a healthy weight is also discussed.  BP/Weight 02/12/2017 01/27/2017 01/24/2017 01/01/2017 08/26/2016 4/0/1027 12/23/3662  Systolic BP 403 474 259 563 875 643 329  Diastolic BP 80 76 94 80 88 84 75  Wt. (Lbs) 214 - 216.04 211.8 211 211 215  BMI 31.6 - 31.9 31.28 31.16 31.15 31.74

## 2017-02-17 NOTE — Assessment & Plan Note (Signed)
Increased and uncontrolled start loratidine

## 2017-02-17 NOTE — Progress Notes (Signed)
Sophia [name]     MRN: [id number]      DOB: [birth year]   HPI Ms. Digilio is here for follow up and re-evaluation of chronic medical conditions, medication management and review of any available recent lab and radiology data.  Recently experienced acute onset of increased anxiety, and uncontrolled blood pressure. Was evaluated by Dr. Meda Coffee, fluoxetine dose was increased, she is tolerating this and has improved clinically. Now realizes and accepts the fact that she needs to be honest with friends and family to take some of her stress off as far as anxiety about requests for help which she is incapable of doing  ROS Denies recent fever or chills. Denies sinus pressure, nasal congestion, ear pain or sore throat. Denies chest congestion, productive cough or wheezing. Denies chest pains, palpitations and leg swelling Denies abdominal pain, nausea, vomiting,diarrhea or constipation.   Denies dysuria, frequency, hesitancy or incontinence. Denies joint pain, swelling and limitation in mobility. Denies headaches, seizures, numbness, or tingling. Denies uncontrolled  depression, anxiety or insomnia. Denies skin break down or rash.   PE  BP 130/80   Pulse 91   Resp 15   Ht 5\' 9"  (1.753 m)   Wt 214 lb (97.1 kg)   SpO2 97%   BMI 31.60 kg/m   Patient alert and oriented and in no cardiopulmonary distress.  HEENT: No facial asymmetry, EOMI,   oropharynx pink and moist.  Neck supple no JVD, no mass.  Chest: Clear to auscultation bilaterally.  CVS: S1, S2 no murmurs, no S3.Regular rate.  ABD: Soft non tender.   Ext: No edema  MS: Adequate ROM spine, shoulders, hips and knees.  Skin: Intact, no ulcerations or rash noted.  Psych: Good eye contact, normal affect. Memory intact not anxious or depressed appearing.  CNS: CN 2-12 intact, power,  normal throughout.no focal deficits noted.   Assessment & Plan  Depression with anxiety Improved on higher dose of fluoxetine . Needs to be  honest about responsibilities she is able to assume and intends to do so  Hyperlipidemia with target LDL less than 100 Hyperlipidemia:Low fat diet discussed and encouraged.   Lipid Panel  Lab Results  Component Value Date   CHOL 176 12/25/2016   HDL 59 12/25/2016   LDLCALC 94 12/25/2016   TRIG 114 12/25/2016   CHOLHDL 3.0 12/25/2016   Controlled, no change in medication     Essential hypertension Controlled, no change in medication DASH diet and commitment to daily physical activity for a minimum of 30 minutes discussed and encouraged, as a part of hypertension management. The importance of attaining a healthy weight is also discussed.  BP/Weight 02/12/2017 01/27/2017 01/24/2017 01/01/2017 08/26/2016 1/0/2585 12/25/7822  Systolic BP 235 361 443 154 008 676 195  Diastolic BP 80 76 94 80 88 84 75  Wt. (Lbs) 214 - 216.04 211.8 211 211 215  BMI 31.6 - 31.9 31.28 31.16 31.15 31.74       Seasonal allergies Increased and uncontrolled start loratidine  GERD Controlled, no change in medication   Obesity (BMI 30-39.9) Unchanged Patient re-educated about  the importance of commitment to a  minimum of 150 minutes of exercise per week.  The importance of healthy food choices with portion control discussed. Encouraged to start a food diary, count calories and to consider  joining a support group. Sample diet sheets offered. Goals set by the patient for the next several months.   Weight /BMI 02/12/2017 01/24/2017 01/01/2017  WEIGHT 214 lb 216 lb  0.6 oz 211 lb 12.8 oz  HEIGHT 5\' 9"  5\' 9"  5\' 9"   BMI 31.6 kg/m2 31.9 kg/m2 31.28 kg/m2

## 2017-02-17 NOTE — Assessment & Plan Note (Signed)
Improved on higher dose of fluoxetine . Needs to be honest about responsibilities she is able to assume and intends to do so

## 2017-02-17 NOTE — Assessment & Plan Note (Signed)
Controlled, no change in medication  

## 2017-02-17 NOTE — Assessment & Plan Note (Signed)
Unchanged Patient re-educated about  the importance of commitment to a  minimum of 150 minutes of exercise per week.  The importance of healthy food choices with portion control discussed. Encouraged to start a food diary, count calories and to consider  joining a support group. Sample diet sheets offered. Goals set by the patient for the next several months.   Weight /BMI 02/12/2017 01/24/2017 01/01/2017  WEIGHT 214 lb 216 lb 0.6 oz 211 lb 12.8 oz  HEIGHT 5\' 9"  5\' 9"  5\' 9"   BMI 31.6 kg/m2 31.9 kg/m2 31.28 kg/m2

## 2017-02-17 NOTE — Assessment & Plan Note (Signed)
Hyperlipidemia:Low fat diet discussed and encouraged.   Lipid Panel  Lab Results  Component Value Date   CHOL 176 12/25/2016   HDL 59 12/25/2016   LDLCALC 94 12/25/2016   TRIG 114 12/25/2016   CHOLHDL 3.0 12/25/2016   Controlled, no change in medication

## 2017-03-10 ENCOUNTER — Other Ambulatory Visit: Payer: Self-pay | Admitting: Family Medicine

## 2017-03-10 DIAGNOSIS — R922 Inconclusive mammogram: Secondary | ICD-10-CM

## 2017-03-18 ENCOUNTER — Ambulatory Visit
Admission: RE | Admit: 2017-03-18 | Discharge: 2017-03-18 | Disposition: A | Discharge status: Home / Self Care | Payer: Medicare PPO | Source: Ambulatory Visit | Attending: Family Medicine | Admitting: Family Medicine

## 2017-03-18 DIAGNOSIS — R922 Inconclusive mammogram: Secondary | ICD-10-CM

## 2017-03-18 DIAGNOSIS — N6001 Solitary cyst of right breast: Secondary | ICD-10-CM | POA: Diagnosis not present

## 2017-03-25 DIAGNOSIS — H1012 Acute atopic conjunctivitis, left eye: Secondary | ICD-10-CM | POA: Diagnosis not present

## 2017-04-04 DIAGNOSIS — I10 Essential (primary) hypertension: Secondary | ICD-10-CM | POA: Diagnosis not present

## 2017-04-04 DIAGNOSIS — F418 Other specified anxiety disorders: Secondary | ICD-10-CM | POA: Diagnosis not present

## 2017-04-04 DIAGNOSIS — E119 Type 2 diabetes mellitus without complications: Secondary | ICD-10-CM | POA: Diagnosis not present

## 2017-04-04 DIAGNOSIS — K219 Gastro-esophageal reflux disease without esophagitis: Secondary | ICD-10-CM | POA: Diagnosis not present

## 2017-04-04 DIAGNOSIS — E785 Hyperlipidemia, unspecified: Secondary | ICD-10-CM | POA: Diagnosis not present

## 2017-04-04 DIAGNOSIS — F331 Major depressive disorder, recurrent, moderate: Secondary | ICD-10-CM | POA: Diagnosis not present

## 2017-04-04 DIAGNOSIS — M479 Spondylosis, unspecified: Secondary | ICD-10-CM | POA: Diagnosis not present

## 2017-04-04 DIAGNOSIS — G4700 Insomnia, unspecified: Secondary | ICD-10-CM | POA: Diagnosis not present

## 2017-04-04 DIAGNOSIS — E669 Obesity, unspecified: Secondary | ICD-10-CM | POA: Diagnosis not present

## 2017-05-03 ENCOUNTER — Other Ambulatory Visit: Payer: Self-pay | Admitting: Family Medicine

## 2017-05-03 DIAGNOSIS — F418 Other specified anxiety disorders: Secondary | ICD-10-CM

## 2017-06-03 ENCOUNTER — Telehealth: Payer: Self-pay | Admitting: Family Medicine

## 2017-06-03 ENCOUNTER — Other Ambulatory Visit: Payer: Self-pay | Admitting: Family Medicine

## 2017-06-03 DIAGNOSIS — E1169 Type 2 diabetes mellitus with other specified complication: Secondary | ICD-10-CM | POA: Diagnosis not present

## 2017-06-03 DIAGNOSIS — E669 Obesity, unspecified: Secondary | ICD-10-CM | POA: Diagnosis not present

## 2017-06-03 DIAGNOSIS — E785 Hyperlipidemia, unspecified: Secondary | ICD-10-CM | POA: Diagnosis not present

## 2017-06-03 LAB — COMPLETE METABOLIC PANEL WITH GFR
ALT: 31 U/L — ABNORMAL HIGH (ref 6–29)
AST: 20 U/L (ref 10–35)
Albumin: 4.4 g/dL (ref 3.6–5.1)
Alkaline Phosphatase: 64 U/L (ref 33–130)
BILIRUBIN TOTAL: 0.3 mg/dL (ref 0.2–1.2)
BUN: 14 mg/dL (ref 7–25)
CHLORIDE: 102 mmol/L (ref 98–110)
CO2: 31 mmol/L (ref 20–31)
Calcium: 9.7 mg/dL (ref 8.6–10.4)
Creat: 0.73 mg/dL (ref 0.50–0.99)
GFR, EST NON AFRICAN AMERICAN: 85 mL/min (ref >=60)
Glucose, Bld: 116 mg/dL — ABNORMAL HIGH (ref 65–99)
Potassium: 3.8 mmol/L (ref 3.5–5.3)
Sodium: 143 mmol/L (ref 135–146)
TOTAL PROTEIN: 7.2 g/dL (ref 6.1–8.1)

## 2017-06-03 LAB — LIPID PANEL
CHOL/HDL RATIO: 3.2 ratio (ref <5.0)
CHOLESTEROL: 166 mg/dL (ref <200)
HDL: 52 mg/dL (ref >50)
LDL Cholesterol: 79 mg/dL (ref <100)
Triglycerides: 174 mg/dL — ABNORMAL HIGH (ref <150)
VLDL: 35 mg/dL — ABNORMAL HIGH (ref <30)

## 2017-06-03 MED ORDER — MECLIZINE HCL 25 MG PO TABS: 25.0000 mg | ORAL_TABLET | Freq: Three times a day (TID) | ORAL | 0 refills | Status: DC | PRN

## 2017-06-03 NOTE — Telephone Encounter (Signed)
Follow up  Pt voiced it started Tuesday, 7.10.18, and on Saturday night was really bad she could not get up at all.  Pt voiced it has her feeling crazy.  Pt voiced her pharmacy is [facility].

## 2017-06-03 NOTE — Telephone Encounter (Signed)
Pt aware.

## 2017-06-03 NOTE — Telephone Encounter (Signed)
New Message  Pts walked in and voiced she needs to be seen this week due to she thinks she possibly has virgo.  Pt voiced she has appt next week on 7.23.18 and advised pt of today and tomorrow md-simpson is in the office.  Please f/u

## 2017-06-03 NOTE — Progress Notes (Signed)
antivert

## 2017-06-03 NOTE — Telephone Encounter (Signed)
antivert prescribed pls let her know

## 2017-06-03 NOTE — Telephone Encounter (Signed)
Patient requesting something be called in to [facility] for vertigo. Everything is spinning and needs something to last her until her 7/23 appt

## 2017-06-04 LAB — HEMOGLOBIN A1C
Hgb A1c MFr Bld: 6.7 % — ABNORMAL HIGH (ref <5.7)
MEAN PLASMA GLUCOSE: 146 mg/dL

## 2017-06-06 ENCOUNTER — Other Ambulatory Visit: Payer: Self-pay | Admitting: Family Medicine

## 2017-06-06 DIAGNOSIS — Z1231 Encounter for screening mammogram for malignant neoplasm of breast: Secondary | ICD-10-CM

## 2017-06-09 ENCOUNTER — Other Ambulatory Visit: Payer: Self-pay

## 2017-06-09 ENCOUNTER — Ambulatory Visit (INDEPENDENT_AMBULATORY_CARE_PROVIDER_SITE_OTHER): Payer: Medicare PPO | Admitting: Family Medicine

## 2017-06-09 VITALS — BP 120/82 | HR 80 | Resp 16 | Ht 69.0 in | Wt 223.0 lb

## 2017-06-09 DIAGNOSIS — E669 Obesity, unspecified: Secondary | ICD-10-CM | POA: Diagnosis not present

## 2017-06-09 DIAGNOSIS — R002 Palpitations: Secondary | ICD-10-CM | POA: Diagnosis not present

## 2017-06-09 DIAGNOSIS — R42 Dizziness and giddiness: Secondary | ICD-10-CM | POA: Diagnosis not present

## 2017-06-09 DIAGNOSIS — F418 Other specified anxiety disorders: Secondary | ICD-10-CM

## 2017-06-09 DIAGNOSIS — E1169 Type 2 diabetes mellitus with other specified complication: Secondary | ICD-10-CM

## 2017-06-09 DIAGNOSIS — R6889 Other general symptoms and signs: Secondary | ICD-10-CM

## 2017-06-09 DIAGNOSIS — I10 Essential (primary) hypertension: Secondary | ICD-10-CM

## 2017-06-09 MED ORDER — FLUOXETINE HCL 40 MG PO CAPS: 40.0000 mg | ORAL_CAPSULE | Freq: Every day | ORAL | 3 refills | Status: DC

## 2017-06-09 NOTE — Progress Notes (Signed)
Sophia [name]     MRN: [id number]      DOB: Jul 20, 1951   HPI [name] is here with a  2 week h h/io exertional fatigue, palpitation one episode, light headedness.x 2 weeks, she is at increased risk for CAd being diabetic , and has past h/o nicotine use  Vertigo intermittent x 3 weeks no response to meclizine  ROS Denies recent fever or chills. Denies sinus pressure, nasal congestion, ear pain or sore throat. Denies chest congestion, productive cough or wheezing.    Denies dysuria, frequency, hesitancy or incontinence. Denies joint pain, swelling and limitation in mobility. Denies headaches, seizures, numbness, or tingling. Denies depression, anxiety or insomnia. Denies skin break down or rash.   PE  BP 120/82   Pulse 80   Resp 16   Ht 5\' 9"  (1.753 m)   Wt 223 lb (101.2 kg)   SpO2 96%   BMI 32.93 kg/m   Patient alert and oriented and in no cardiopulmonary distress.  HEENT: No facial asymmetry, EOMI,   oropharynx pink and moist.  Neck supple no JVD, no mass.TM clear Chest: Clear to auscultation bilaterally.No reproducible chest wall tenderness  CVS: S1, S2 no murmurs, no S3.Regular rate. EKG: NSR , no lVH, no ischemic changes ABD: Soft non tender.   Ext: No edema  MS: Adequate ROM spine, shoulders, hips and knees.  Skin: Intact, no ulcerations or rash noted.  Psych: Good eye contact, normal affect. Memory intact not anxious or depressed appearing.  CNS: CN 2-12 intact, power,  normal throughout.no focal deficits noted.   Assessment & Plan  Palpitations 2 WEEK H/O INCREASED [PAL[PITATIONS AND LIGHT HEADEDNESS, AS WELL AS EXERTIONAL FATIGUE. nEEDS CARDIOLOGY EVALUATION, OFFICE ekg DONE AE VISIT, nsr, NO ISCHEMIA , NO Lvh  Essential hypertension Controlled, no change in medication DASH diet and commitment to daily physical activity for a minimum of 30 minutes discussed and encouraged, as a part of hypertension management. The importance of attaining a healthy  weight is also discussed.  BP/Weight 06/09/2017 02/12/2017 01/27/2017 01/24/2017 01/01/2017 44/0/1027 12/23/3662  Systolic BP 403 474 259 563 875 643 329  Diastolic BP 82 80 76 94 80 88 84  Wt. (Lbs) 223 214 - 216.04 211.8 211 211  BMI 32.93 31.6 - 31.9 31.28 31.16 31.15       Diabetes mellitus type 2 in obese (HCC) Deteriorated, no change in management, remains diet controlled [name] is reminded of the importance of commitment to daily physical activity for 30 minutes or more, as able and the need to limit carbohydrate intake to 30 to 60 grams per meal to help with blood sugar control.   .   [name] is reminded of the importance of daily foot exam, annual eye examination, and good blood sugar, blood pressure and cholesterol control.  Diabetic Labs Latest Ref Rng & Units 06/03/2017 12/25/2016 08/21/2016 03/20/2016 11/20/2015  HbA1c <5.7 % 6.7(H) 6.2(H) 6.4(H) 6.6(H) -  Microalbumin Not estab mg/dL - 13.8 - - -  Micro/Creat Ratio <30 mcg/mg creat - 70(H) - - -  Chol <200 mg/dL 166 176 - 134 -  HDL >50 mg/dL 52 59 - 52 -  Calc LDL <100 mg/dL 79 94 - 58 -  Triglycerides <150 mg/dL 174(H) 114 - 120 -  Creatinine 0.50 - 0.99 mg/dL 0.73 0.86 0.75 0.84 0.75   BP/Weight 06/09/2017 02/12/2017 01/27/2017 01/24/2017 01/01/2017 51/06/8415 6/0/6301  Systolic BP 601 093 235 573 220 254 270  Diastolic BP 82 80 76  94 80 88 84  Wt. (Lbs) 223 214 - 216.04 211.8 211 211  BMI 32.93 31.6 - 31.9 31.28 31.16 31.15   Foot/eye exam completion dates Latest Ref Rng & Units 01/01/2017 11/19/2016  Eye Exam No Retinopathy - No Retinopathy  Foot Form Completion - Done -        Depression with anxiety Inadequately treated , unable to afford higher dose prescribed, not suicidal or homicidal, pt to resume 40 mg dose of fluoxetine daily  Obesity (BMI 30-39.9) Deteriorated. Patient re-educated about  the importance of commitment to a  minimum of 150 minutes of exercise per week.  The importance of healthy food choices  with portion control discussed. Encouraged to start a food diary, count calories and to consider  joining a support group. Sample diet sheets offered. Goals set by the patient for the next several months.   Weight /BMI 06/09/2017 02/12/2017 01/24/2017  WEIGHT 223 lb 214 lb 216 lb 0.6 oz  HEIGHT 5\' 9"  5\' 9"  5\' 9"   BMI 32.93 kg/m2 31.6 kg/m2 31.9 kg/m2      Vertigo 3 week h/o vertigo, no response to meclizine, will refer to ENT

## 2017-06-09 NOTE — Patient Instructions (Addendum)
Annual physical exam in 3.5 months, call if you need me vbefore  You are referred to cardiolgy  Cut back on fried an fatty foods  EKG shows no heart damage , but your symmptoms are concerning  You are referred to ENT   Thank you  for choosing [facility]. We consider it a privelige to serve you.  Delivering excellent health care in a caring and  compassionate way is our goal.  Partnering with you,  so that together we can achieve this goal is our strategy.   Reduced dose of fluoxitine to 40 mg daily  Antivert dose is reduced

## 2017-06-11 ENCOUNTER — Other Ambulatory Visit: Payer: Self-pay | Admitting: Family Medicine

## 2017-06-11 ENCOUNTER — Ambulatory Visit (HOSPITAL_COMMUNITY)
Admission: RE | Admit: 2017-06-11 | Discharge: 2017-06-11 | Disposition: A | Discharge status: Home / Self Care | Payer: Medicare PPO | Source: Ambulatory Visit | Attending: Family Medicine | Admitting: Family Medicine

## 2017-06-11 DIAGNOSIS — Z1231 Encounter for screening mammogram for malignant neoplasm of breast: Secondary | ICD-10-CM | POA: Insufficient documentation

## 2017-06-11 MED ORDER — MECLIZINE HCL 25 MG PO TABS: 25.0000 mg | ORAL_TABLET | Freq: Three times a day (TID) | ORAL | 0 refills | Status: DC | PRN

## 2017-06-22 ENCOUNTER — Encounter: Payer: Self-pay | Admitting: Family Medicine

## 2017-06-22 DIAGNOSIS — R42 Dizziness and giddiness: Secondary | ICD-10-CM | POA: Insufficient documentation

## 2017-06-22 NOTE — Assessment & Plan Note (Signed)
Controlled, no change in medication DASH diet and commitment to daily physical activity for a minimum of 30 minutes discussed and encouraged, as a part of hypertension management. The importance of attaining a healthy weight is also discussed.  BP/Weight 06/09/2017 02/12/2017 01/27/2017 01/24/2017 01/01/2017 10/24/8717 01/21/7254  Systolic BP 001 642 903 795 583 167 425  Diastolic BP 82 80 76 94 80 88 84  Wt. (Lbs) 223 214 - 216.04 211.8 211 211  BMI 32.93 31.6 - 31.9 31.28 31.16 31.15

## 2017-06-22 NOTE — Assessment & Plan Note (Signed)
Deteriorated. Patient re-educated about  the importance of commitment to a  minimum of 150 minutes of exercise per week.  The importance of healthy food choices with portion control discussed. Encouraged to start a food diary, count calories and to consider  joining a support group. Sample diet sheets offered. Goals set by the patient for the next several months.   Weight /BMI 06/09/2017 02/12/2017 01/24/2017  WEIGHT 223 lb 214 lb 216 lb 0.6 oz  HEIGHT 5\' 9"  5\' 9"  5\' 9"   BMI 32.93 kg/m2 31.6 kg/m2 31.9 kg/m2

## 2017-06-22 NOTE — Assessment & Plan Note (Signed)
2 WEEK H/O INCREASED [PAL[PITATIONS AND LIGHT HEADEDNESS, AS WELL AS EXERTIONAL FATIGUE. nEEDS CARDIOLOGY EVALUATION, OFFICE ekg DONE AE VISIT, nsr, NO ISCHEMIA , NO Lvh

## 2017-06-22 NOTE — Assessment & Plan Note (Signed)
Deteriorated, no change in management, remains diet controlled Sophia [name] is reminded of the importance of commitment to daily physical activity for 30 minutes or more, as able and the need to limit carbohydrate intake to 30 to 60 grams per meal to help with blood sugar control.   .   Sophia [name] is reminded of the importance of daily foot exam, annual eye examination, and good blood sugar, blood pressure and cholesterol control.  Diabetic Labs Latest Ref Rng & Units 06/03/2017 12/25/2016 08/21/2016 03/20/2016 11/20/2015  HbA1c <5.7 % 6.7(H) 6.2(H) 6.4(H) 6.6(H) -  Microalbumin Not estab mg/dL - 13.8 - - -  Micro/Creat Ratio <30 mcg/mg creat - 70(H) - - -  Chol <200 mg/dL 166 176 - 134 -  HDL >50 mg/dL 52 59 - 52 -  Calc LDL <100 mg/dL 79 94 - 58 -  Triglycerides <150 mg/dL 174(H) 114 - 120 -  Creatinine 0.50 - 0.99 mg/dL 0.73 0.86 0.75 0.84 0.75   BP/Weight 06/09/2017 02/12/2017 01/27/2017 01/24/2017 01/01/2017 23/01/4355 06/23/1682  Systolic BP 729 021 115 520 802 233 612  Diastolic BP 82 80 76 94 80 88 84  Wt. (Lbs) 223 214 - 216.04 211.8 211 211  BMI 32.93 31.6 - 31.9 31.28 31.16 31.15   Foot/eye exam completion dates Latest Ref Rng & Units 01/01/2017 11/19/2016  Eye Exam No Retinopathy - No Retinopathy  Foot Form Completion - Done -

## 2017-06-22 NOTE — Assessment & Plan Note (Signed)
Inadequately treated , unable to afford higher dose prescribed, not suicidal or homicidal, pt to resume 40 mg dose of fluoxetine daily

## 2017-06-22 NOTE — Assessment & Plan Note (Addendum)
3 week h/o vertigo, no response to meclizine, will refer to ENT

## 2017-06-23 NOTE — Progress Notes (Signed)
Cardiology Office Note   Date:  06/24/2017   ID:  DELAINA FETSCH, DOB Apr 10, 1951, MRN [id number]  PCP:  Fayrene Helper, MD  Cardiologist:   Jenkins Rouge, MD   No chief complaint on file.     History of Present Illness: Sophia [name] is a 66 y.o. female who presents for consultation regarding palpitations Referred by Dr Moshe Cipro Reviewed her office notes from 06/09/17  Complained of 2 weeks exertional fatigue palpitations and one episode of dizziness. Previous smoker and diabetic with elevated lipids History of vertigo with poor response to meclizine Referred to ENT. . Started on fluoxetine for anxiety and depression.   Today she indicates being better. Still taking meclizine for starts of vertigo. No palpitations Says taking her vitamins made Her feel better. She had stopped when she had vertigo. Her fatigue is much better She works with [employer] part time and is retired from [employer]    Past Medical History:  Diagnosis Date  . Anxiety   . Arthritis   . Depression   . Diabetes mellitus without complication ([place])   . GERD (gastroesophageal reflux disease)   . Hypercholesteremia   . Hypertension   . PONV (postoperative nausea and vomiting)     Past Surgical History:  Procedure Laterality Date  . ABDOMINAL HYSTERECTOMY  1982   tubes and womb, bleeding and ectopic  . ABDOMINAL SURGERY     removal of tumors  . BREAST SURGERY Left 1983   benign tumor  . CATARACT EXTRACTION W/PHACO Right 01/11/2014   Procedure: CATARACT EXTRACTION PHACO AND INTRAOCULAR LENS PLACEMENT (IOC);  Surgeon: Elta Guadeloupe T. Gershon Crane, MD;  Location: [facility];  Service: Ophthalmology;  Laterality: Right;  CDE 5.57  . CATARACT EXTRACTION W/PHACO Left 01/25/2014   Procedure: CATARACT EXTRACTION PHACO AND INTRAOCULAR LENS PLACEMENT (IOC);  Surgeon: Elta Guadeloupe T. Gershon Crane, MD;  Location: [facility];  Service: Ophthalmology;  Laterality: Left;  CDE:5.19  . COLONOSCOPY  2007 [facility]   NL EXAM  .  COLONOSCOPY N/A 04/22/2014   Dr. Fields:Normal mucosa in the terminal ileum/Two COLON polyps REMOVED/ Mild diverticulosis in the ascending colon and transverse colon/The LEFT colon IS redundant/Small internal hemorrhoids. Path: tubular adenoma. Next colonoscopy in 5-10 years  . ESOPHAGOGASTRODUODENOSCOPY N/A 04/22/2014   Dr. Fields:MICROCYTIC ANEMIA MOST LIKELY DUE TO ASA/VOLTAREN/Small hiatal hernia/MODERATE Non-erosive gastritis. Negative H.pylori  . 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 UPPER GASTROINTESTINAL ENDOSCOPY  2007 CHEST PAIN   NL EXAM  . YAG LASER APPLICATION Right 2/44/0102   Procedure: YAG LASER APPLICATION;  Surgeon: Elta Guadeloupe T. Gershon Crane, MD;  Location: [facility];  Service: Ophthalmology;  Laterality: Right;  . YAG LASER APPLICATION Left 06/12/3663   Procedure: YAG LASER APPLICATION;  Surgeon: Rutherford Guys, MD;  Location: [facility];  Service: Ophthalmology;  Laterality: Left;     Current Outpatient Prescriptions  Medication Sig Dispense Refill  . acetaminophen (TYLENOL) 325 MG tablet Take 325 mg by mouth every 6 (six) hours as needed for moderate pain.    . benazepril-hydrochlorthiazide (LOTENSIN HCT) 20-12.5 MG tablet Take 2 tablets by mouth daily. 180 tablet 1  . busPIRone (BUSPAR) 10 MG tablet Take 1 tablet (10 mg total) by mouth 3 (three) times daily. 90 tablet 0  . busPIRone (BUSPAR) 5 MG tablet TAKE 1 TABLET THREE TIMES DAILY  (DOSE  INCREASE) 270 tablet 2  . calcium-vitamin D (OSCAL WITH D) 500-200 MG-UNIT per tablet Take 1 tablet by mouth daily with breakfast.    .  diltiazem (CARTIA XT) 180 MG 24 hr capsule TAKE 1 CAPSULE (180 MG TOTAL) BY MOUTH DAILY. 90 capsule 1  . FLUoxetine (PROZAC) 40 MG capsule Take 1 capsule (40 mg total) by mouth daily. 90 capsule 3  . fluticasone (FLONASE) 50 MCG/ACT nasal spray Place 2 sprays into both nostrils daily. 48 g 2  . gabapentin (NEURONTIN) 100 MG capsule TAKE 1 CAPSULE THREE TIMES DAILY 270 capsule 1  . loratadine (CLARITIN) 10 MG  tablet Take 1 tablet (10 mg total) by mouth daily as needed for allergies. 90 tablet 1  . meclizine (ANTIVERT) 25 MG tablet Take 1 tablet (25 mg total) by mouth 3 (three) times daily as needed for dizziness. 30 tablet 0  . MEIJER LANCETS MISC Use as directed with TrueResult meter 100 each 1  . Multiple Vitamin (MULTIVITAMIN WITH MINERALS) TABS tablet Take 1 tablet by mouth daily.    . Nutritional Supplements (ESTROVEN MAXIMUM STRENGTH PO) Take 1 tablet by mouth daily.    . Omega-3 Fatty Acids (FISH OIL) 1000 MG CAPS Take 1 capsule by mouth daily.    . pantoprazole (PROTONIX) 40 MG tablet Take 1 tablet (40 mg total) by mouth daily. 90 tablet 1  . pravastatin (PRAVACHOL) 40 MG tablet TAKE 1 TABLET EVERY EVENING 90 tablet 1  . ranitidine (ZANTAC) 300 MG capsule Take 1 capsule (300 mg total) by mouth every evening. 90 capsule 2  . temazepam (RESTORIL) 15 MG capsule TAKE 1 CAPSULE AT BEDTIME AS NEEDED FOR SLEEP 90 capsule 0  . vitamin C (ASCORBIC ACID) 500 MG tablet Take 500 mg by mouth daily.     No current facility-administered medications for this visit.     Allergies:   Patient has no known allergies.    Social History:  The patient  reports that she quit smoking about 27 years ago. Her smoking use included Cigarettes. She has a 5.00 pack-year smoking history. Her smokeless tobacco use includes Snuff. She reports that she does not drink alcohol or use drugs.   Family History:  The patient's family history includes Arthritis in her father; Cancer in her father; Colon polyps (age of onset: 28) in her sister; Diabetes in her sister and sister; Hypertension in her father, sister, and sister; Kidney disease in her sister.    ROS:  Please see the history of present illness.   Otherwise, review of systems are positive for none.   All other systems are reviewed and negative.    PHYSICAL EXAM: VS:  BP 128/78 (BP Location: Left Arm, Cuff Size: Large)   Pulse 85   Ht 5\' 9"  (1.753 m)   Wt 224 lb  (101.6 kg)   SpO2 95%   BMI 33.08 kg/m  , BMI Body mass index is 33.08 kg/m. Affect appropriate Healthy:  appears stated age 27: normal Neck supple with no adenopathy JVP normal no bruits no thyromegaly Lungs clear with no wheezing and good diaphragmatic motion Heart:  S1/S2 1/6 SEM  murmur, no rub, gallop or click PMI normal Abdomen: benighn, BS positve, no tenderness, no AAA no bruit.  No HSM or HJR Distal pulses intact with no bruits No edema Neuro non-focal Skin warm and dry No muscular weakness    EKG:  06/09/17 SR rate 83 normal ECG    Recent Labs: 12/25/2016: Hemoglobin 12.3; Platelets 208; TSH 1.55 06/03/2017: ALT 31; BUN 14; Creat 0.73; Potassium 3.8; Sodium 143    Lipid Panel    Component Value Date/Time   CHOL 166 06/03/2017  0801   TRIG 174 (H) 06/03/2017 0801   HDL 52 06/03/2017 0801   CHOLHDL 3.2 06/03/2017 0801   VLDL 35 (H) 06/03/2017 0801   LDLCALC 79 06/03/2017 0801      Wt Readings from Last 3 Encounters:  06/24/17 224 lb (101.6 kg)  06/09/17 223 lb (101.2 kg)  02/12/17 214 lb (97.1 kg)      Other studies Reviewed: Additional studies/ records that were reviewed today include: Notes Dr Moshe Cipro labs and ECG.    ASSESSMENT AND PLAN:  1.  Palpitations: benign sounding resolved no need for monitor TSH normal 12/25/16  2. Anxiety:  Continue prozac  f/u primary improved  3. DM Discussed low carb diet.  Target hemoglobin A1c is 6.5 or less.  Continue current medications. Lab Results  Component Value Date   HGBA1C 6.7 (H) 06/03/2017     4. Cholesterol:  Continue statin  Lab Results  Component Value Date   LDLCALC 79 06/03/2017   5. HTN:  Well controlled.  Continue current medications and low sodium Dash type diet.   6. Vertigo: PRN meclizine told her to see vestibular rehab [facility] to get preventive exercises  7. Fatigue:  Functional labs ok f/u echo make sure RV/LV function normal  8. Murmur: history of benign SEM see  above for echo    Current medicines are reviewed at length with the patient today.  The patient does not have concerns regarding medicines.  The following changes have been made:  no change  Labs/ tests ordered today include: event monitor Echo   Orders Placed This Encounter  Procedures  . ECHOCARDIOGRAM COMPLETE     Disposition:   FU with cardiology PRN pending test results      Signed, Jenkins Rouge, MD  06/24/2017 4:04 PM    [facility] [place], [place], [place]  [address] Phone: [phone]; Fax: [phone]

## 2017-06-24 ENCOUNTER — Ambulatory Visit (INDEPENDENT_AMBULATORY_CARE_PROVIDER_SITE_OTHER): Payer: Medicare PPO | Admitting: Cardiovascular Disease

## 2017-06-24 ENCOUNTER — Encounter: Payer: Self-pay | Admitting: Cardiovascular Disease

## 2017-06-24 VITALS — BP 128/78 | HR 85 | Ht 69.0 in | Wt 224.0 lb

## 2017-06-24 DIAGNOSIS — R0600 Dyspnea, unspecified: Secondary | ICD-10-CM | POA: Diagnosis not present

## 2017-06-24 DIAGNOSIS — R5383 Other fatigue: Secondary | ICD-10-CM

## 2017-06-24 NOTE — Patient Instructions (Signed)
Medication Instructions:   Your physician recommends that you continue on your current medications as directed. Please refer to the Current Medication list given to you today.  Labwork:  NONE  Testing/Procedures: Your physician has requested that you have an echocardiogram. Echocardiography is a painless test that uses sound waves to create images of your heart. It provides your doctor with information about the size and shape of your heart and how well your heart's chambers and valves are working. This procedure takes approximately one hour. There are no restrictions for this procedure.  Follow-Up:  Your physician recommends that you schedule a follow-up appointment in: as needed.  Any Other Special Instructions Will Be Listed Below (If Applicable).  If you need a refill on your cardiac medications before your next appointment, please call your pharmacy.

## 2017-07-01 ENCOUNTER — Telehealth: Payer: Self-pay | Admitting: *Deleted

## 2017-07-01 ENCOUNTER — Ambulatory Visit (HOSPITAL_COMMUNITY)
Admission: RE | Admit: 2017-07-01 | Discharge: 2017-07-01 | Disposition: A | Discharge status: Home / Self Care | Payer: Medicare PPO | Source: Ambulatory Visit | Attending: Cardiovascular Disease | Admitting: Cardiovascular Disease

## 2017-07-01 DIAGNOSIS — R0600 Dyspnea, unspecified: Secondary | ICD-10-CM

## 2017-07-01 DIAGNOSIS — R5383 Other fatigue: Secondary | ICD-10-CM

## 2017-07-01 DIAGNOSIS — I340 Nonrheumatic mitral (valve) insufficiency: Secondary | ICD-10-CM | POA: Insufficient documentation

## 2017-07-01 LAB — ECHOCARDIOGRAM COMPLETE
AVLVOTPG: 7 mmHg
CHL CUP MV DEC (S): 239
CHL CUP STROKE VOLUME: 42 mL
E/e' ratio: 11.43
EWDT: 239 ms
FS: 38 % (ref 28–44)
IV/PV OW: 0.98
LA ID, A-P, ES: 31 mm
LA diam index: 1.37 cm/m2
LA vol index: 27.3 mL/m2
LA vol: 61.6 mL
LAVOLA4C: 58.3 mL
LDCA: 2.84 cm2
LEFT ATRIUM END SYS DIAM: 31 mm
LV E/e' medial: 11.43
LV E/e'average: 11.43
LV PW d: 12 mm — AB (ref 0.6–1.1)
LV SIMPSON'S DISK: 59
LV TDI E'LATERAL: 8.7
LV TDI E'MEDIAL: 5.33
LV dias vol: 71 mL (ref 46–106)
LV e' LATERAL: 8.7 cm/s
LVDIAVOLIN: 31 mL/m2
LVOT SV: 96 mL
LVOT VTI: 33.9 cm
LVOTD: 19 mm
LVOTPV: 134 cm/s
LVSYSVOL: 29 mL
LVSYSVOLIN: 13 mL/m2
Lateral S' vel: 11.4 cm/s
MV Peak grad: 4 mmHg
MV pk A vel: 126 m/s
MV pk E vel: 99.4 m/s
RV TAPSE: 20.7 mm

## 2017-07-01 NOTE — Telephone Encounter (Signed)
Called patient with test results. No answer. Left message to call back.  

## 2017-07-01 NOTE — Telephone Encounter (Signed)
-----   Message from Josue Hector, MD sent at 07/01/2017  4:48 PM EDT ----- EF normal just mild MR echo good no reason for SOB

## 2017-07-01 NOTE — Progress Notes (Signed)
*  PRELIMINARY RESULTS* Echocardiogram 2D Echocardiogram has been performed.  Samuel Germany 07/01/2017, 10:57 AM

## 2017-07-07 ENCOUNTER — Other Ambulatory Visit: Payer: Self-pay | Admitting: Family Medicine

## 2017-08-04 ENCOUNTER — Other Ambulatory Visit: Payer: Self-pay | Admitting: Family Medicine

## 2017-08-25 ENCOUNTER — Other Ambulatory Visit: Payer: Self-pay | Admitting: Family Medicine

## 2017-08-26 NOTE — Telephone Encounter (Signed)
Seen 7 23 18 

## 2017-09-12 ENCOUNTER — Other Ambulatory Visit: Payer: Self-pay | Admitting: Family Medicine

## 2017-10-15 ENCOUNTER — Ambulatory Visit (INDEPENDENT_AMBULATORY_CARE_PROVIDER_SITE_OTHER): Payer: Medicare PPO | Admitting: Family Medicine

## 2017-10-15 ENCOUNTER — Encounter: Payer: Self-pay | Admitting: Family Medicine

## 2017-10-15 VITALS — BP 144/84 | HR 92 | Resp 16 | Ht 69.0 in | Wt 214.0 lb

## 2017-10-15 DIAGNOSIS — Z90710 Acquired absence of both cervix and uterus: Secondary | ICD-10-CM

## 2017-10-15 DIAGNOSIS — F418 Other specified anxiety disorders: Secondary | ICD-10-CM | POA: Diagnosis not present

## 2017-10-15 DIAGNOSIS — E785 Hyperlipidemia, unspecified: Secondary | ICD-10-CM

## 2017-10-15 DIAGNOSIS — K529 Noninfective gastroenteritis and colitis, unspecified: Secondary | ICD-10-CM

## 2017-10-15 DIAGNOSIS — E8940 Asymptomatic postprocedural ovarian failure: Secondary | ICD-10-CM | POA: Diagnosis not present

## 2017-10-15 DIAGNOSIS — I10 Essential (primary) hypertension: Secondary | ICD-10-CM

## 2017-10-15 DIAGNOSIS — E1169 Type 2 diabetes mellitus with other specified complication: Secondary | ICD-10-CM

## 2017-10-15 DIAGNOSIS — Z0000 Encounter for general adult medical examination without abnormal findings: Secondary | ICD-10-CM | POA: Diagnosis not present

## 2017-10-15 DIAGNOSIS — F17220 Nicotine dependence, chewing tobacco, uncomplicated: Secondary | ICD-10-CM

## 2017-10-15 DIAGNOSIS — E669 Obesity, unspecified: Secondary | ICD-10-CM | POA: Diagnosis not present

## 2017-10-15 MED ORDER — TEMAZEPAM 15 MG PO CAPS: ORAL_CAPSULE | ORAL | 1 refills | Status: DC

## 2017-10-15 MED ORDER — DILTIAZEM HCL ER COATED BEADS 240 MG PO CP24: 240.0000 mg | ORAL_CAPSULE | Freq: Every day | ORAL | 1 refills | Status: DC

## 2017-10-15 MED ORDER — FLUOXETINE HCL 40 MG PO CAPS
40.0000 mg | ORAL_CAPSULE | Freq: Every day | ORAL | 1 refills | Status: DC
Start: 2017-10-15 — End: 2018-06-02

## 2017-10-15 NOTE — Patient Instructions (Addendum)
Wellness visit with  Nurse in mid   January  MD f/u in end April call if you need me before  Return stool cards for testing next week please   Increase the cartia to 240 mg one daily, stop the cartia 180 mg daly as blood pressure is high, continue benazepril/ hctz as before  Fasting lipid, cmp and EGFR and hBA1C in   Mid January  Bone density to be scheduled for mid January            Viral Gastroenteritis, Adult Viral gastroenteritis is also known as the stomach flu. This condition is caused by certain germs (viruses). These germs can be passed from person to person very easily (are very contagious). This condition can cause sudden watery poop (diarrhea), fever, and throwing up (vomiting). Having watery poop and throwing up can make you feel weak and cause you to get dehydrated. Dehydration can make you tired and thirsty, make you have a dry mouth, and make it so you pee (urinate) less often. Older adults and people with other diseases or a weak defense system (immune system) are at higher risk for dehydration. It is important to replace the fluids that you lose from having watery poop and throwing up. Follow these instructions at home: Follow instructions from your doctor about how to care for yourself at home. Eating and drinking  Follow these instructions as told by your doctor:  Take an oral rehydration solution (ORS). This is a drink that is sold at pharmacies and stores.  Drink clear fluids in small amounts as you are able, such as: ? Water. ? Ice chips. ? Diluted fruit juice. ? Low-calorie sports drinks.  Eat bland, easy-to-digest foods in small amounts as you are able, such as: ? Bananas. ? Applesauce. ? Rice. ? Low-fat (lean) meats. ? Toast. ? Crackers.  Avoid fluids that have a lot of sugar or caffeine in them.  Avoid alcohol.  Avoid spicy or fatty foods.  General instructions  Drink enough fluid to keep your pee (urine) clear or pale yellow.  Wash  your hands often. If you cannot use soap and water, use hand sanitizer.  Make sure that all people in your home wash their hands well and often.  Rest at home while you get better.  Take over-the-counter and prescription medicines only as told by your doctor.  Watch your condition for any changes.  Take a warm bath to help with any burning or pain from having watery poop.  Keep all follow-up visits as told by your doctor. This is important. Contact a doctor if:  You cannot keep fluids down.  Your symptoms get worse.  You have new symptoms.  You feel light-headed or dizzy.  You have muscle cramps. Get help right away if:  You have chest pain.  You feel very weak or you pass out (faint).  You see blood in your throw-up.  Your throw-up looks like coffee grounds.  You have bloody or black poop (stools) or poop that look like tar.  You have a very bad headache, a stiff neck, or both.  You have a rash.  You have very bad pain, cramping, or bloating in your belly (abdomen).  You have trouble breathing.  You are breathing very quickly.  Your heart is beating very quickly.  Your skin feels cold and clammy.  You feel confused.  You have pain when you pee.  You have signs of dehydration, such as: ? Dark pee, hardly any pee, or  no pee. ? Cracked lips. ? Dry mouth. ? Sunken eyes. ? Sleepiness. ? Weakness. This information is not intended to replace advice given to you by your health care provider. Make sure you discuss any questions you have with your health care provider. Document Released: 04/22/2008 Document Revised: 05/24/2016 Document Reviewed: 07/11/2015 Elsevier Interactive Patient Education  2017 [place] Choices to Help Relieve Diarrhea, Adult When you have diarrhea, the foods you eat and your eating habits are very important. Choosing the right foods and drinks can help:  Relieve diarrhea.  Replace lost fluids and nutrients.  Prevent  dehydration.  What general guidelines should I follow? Relieving diarrhea  Choose foods with less than 2 g or .07 oz. of fiber per serving.  Limit fats to less than 8 tsp (38 g or 1.34 oz.) a day.  Avoid the following: ? Foods and beverages sweetened with high-fructose corn syrup, honey, or sugar alcohols such as xylitol, sorbitol, and mannitol. ? Foods that contain a lot of fat or sugar. ? Fried, greasy, or spicy foods. ? High-fiber grains, breads, and cereals. ? Raw fruits and vegetables.  Eat foods that are rich in probiotics. These foods include dairy products such as yogurt and fermented milk products. They help increase healthy bacteria in the stomach and intestines (gastrointestinal tract, or GI tract).  If you have lactose intolerance, avoid dairy products. These may make your diarrhea worse.  Take medicine to help stop diarrhea (antidiarrheal medicine) only as told by your health care provider. Replacing nutrients  Eat small meals or snacks every 3-4 hours.  Eat bland foods, such as white rice, toast, or baked potato, until your diarrhea starts to get better. Gradually reintroduce nutrient-rich foods as tolerated or as told by your health care provider. This includes: ? Well-cooked protein foods. ? Peeled, seeded, and soft-cooked fruits and vegetables. ? Low-fat dairy products.  Take vitamin and mineral supplements as told by your health care provider. Preventing dehydration   Start by sipping water or a special solution to prevent dehydration (oral rehydration solution, ORS). Urine that is clear or pale yellow means that you are getting enough fluid.  Try to drink at least 8-10 cups of fluid each day to help replace lost fluids.  You may add other liquids in addition to water, such as clear juice or decaffeinated sports drinks, as tolerated or as told by your health care provider.  Avoid drinks with caffeine, such as coffee, tea, or soft drinks.  Avoid  alcohol. What foods are recommended? The items listed may not be a complete list. Talk with your health care provider about what dietary choices are best for you. Grains White rice. White, [place], or pita breads (fresh or toasted), including plain rolls, buns, or bagels. White pasta. Saltine, soda, or graham crackers. Pretzels. Low-fiber cereal. Cooked cereals made with water (such as cornmeal, farina, or cream cereals). Plain muffins. Matzo. Melba toast. Zwieback. Vegetables Potatoes (without the skin). Most well-cooked and canned vegetables without skins or seeds. Tender lettuce. Fruits Apple sauce. Fruits canned in juice. Cooked apricots, cherries, grapefruit, peaches, pears, or plums. Fresh bananas and cantaloupe. Meats and other protein foods Baked or boiled chicken. Eggs. Tofu. Fish. Seafood. Smooth nut butters. Ground or well-cooked tender beef, ham, veal, lamb, pork, or poultry. Dairy Plain yogurt, kefir, and unsweetened liquid yogurt. Lactose-free milk, buttermilk, skim milk, or soy milk. Low-fat or nonfat hard cheese. Beverages Water. Low-calorie sports drinks. Fruit juices without pulp. Strained tomato and vegetable juices. Decaffeinated  teas. Sugar-free beverages not sweetened with sugar alcohols. Oral rehydration solutions, if approved by your health care provider. Seasoning and other foods Bouillon, broth, or soups made from recommended foods. What foods are not recommended? The items listed may not be a complete list. Talk with your health care provider about what dietary choices are best for you. Grains Whole grain, whole wheat, bran, or rye breads, rolls, pastas, and crackers. Wild or brown rice. Whole grain or bran cereals. Barley. Oats and oatmeal. Corn tortillas or taco shells. Granola. Popcorn. Vegetables Raw vegetables. Fried vegetables. Cabbage, broccoli, Brussels sprouts, artichokes, baked beans, beet greens, corn, kale, legumes, peas, sweet potatoes, and yams. Potato  skins. Cooked spinach and cabbage. Fruits Dried fruit, including raisins and dates. Raw fruits. Stewed or dried prunes. Canned fruits with syrup. Meat and other protein foods Fried or fatty meats. Deli meats. Chunky nut butters. Nuts and seeds. Beans and lentils. Berniece Salines. Hot dogs. Sausage. Dairy High-fat cheeses. Whole milk, chocolate milk, and beverages made with milk, such as milk shakes. Half-and-half. Cream. sour cream. Ice cream. Beverages Caffeinated beverages (such as coffee, tea, soda, or energy drinks). Alcoholic beverages. Fruit juices with pulp. Prune juice. Soft drinks sweetened with high-fructose corn syrup or sugar alcohols. High-calorie sports drinks. Fats and oils Butter. Cream sauces. Margarine. Salad oils. Plain salad dressings. Olives. Avocados. Mayonnaise. Sweets and desserts Sweet rolls, doughnuts, and sweet breads. Sugar-free desserts sweetened with sugar alcohols such as xylitol and sorbitol. Seasoning and other foods Honey. Hot sauce. Chili powder. Gravy. Cream-based or milk-based soups. Pancakes and waffles. Summary  When you have diarrhea, the foods you eat and your eating habits are very important.  Make sure you get at least 8-10 cups of fluid each day, or enough to keep your urine clear or pale yellow.  Eat bland foods and gradually reintroduce healthy, nutrient-rich foods as tolerated, or as told by your health care provider.  Avoid high-fiber, fried, greasy, or spicy foods. This information is not intended to replace advice given to you by your health care provider. Make sure you discuss any questions you have with your health care provider. Document Released: 01/25/2004 Document Revised: 11/01/2016 Document Reviewed: 11/01/2016 Elsevier Interactive Patient Education  2017 [employer].

## 2017-10-17 ENCOUNTER — Encounter: Payer: Self-pay | Admitting: Family Medicine

## 2017-10-17 DIAGNOSIS — K529 Noninfective gastroenteritis and colitis, unspecified: Secondary | ICD-10-CM | POA: Insufficient documentation

## 2017-10-17 NOTE — Assessment & Plan Note (Signed)
Annual exam as documented. . Immunization and cancer screening needs are specifically addressed at this visit.  

## 2017-10-17 NOTE — Assessment & Plan Note (Signed)
Uncontrolled, dose increase in medication wit close follow up DASH diet and commitment to daily physical activity for a minimum of 30 minutes discussed and encouraged, as a part of hypertension management. The importance of attaining a healthy weight is also discussed.  BP/Weight 10/15/2017 06/24/2017 06/09/2017 02/12/2017 01/27/2017 01/24/2017 01/11/4974  Systolic BP 300 511 021 117 356 701 410  Diastolic BP 84 78 82 80 76 94 80  Wt. (Lbs) 214 224 223 214 - 216.04 211.8  BMI 31.6 33.08 32.93 31.6 - 31.9 31.28

## 2017-10-17 NOTE — Progress Notes (Signed)
Sophia [name]     MRN: [id number]      DOB: 06-21-1951  HPI: Patient is in for annual physical exam. Two episodes of diarrhea which started overnight associated with  nausea, no other family member is affected. Recent labs, if available are reviewed. Immunization is reviewed , no need to update Blood pressure is uncontrolled and medication adjustment is made. She denies chest pain , palpitations, leg swelling, orthopnea or PND   PE: BP (!) 144/84   Pulse 92   Resp 16   Ht 5\' 9"  (1.753 m)   Wt 214 lb (97.1 kg)   SpO2 94%   BMI 31.60 kg/m   Pleasant  female, alert and oriented x 3, in no cardio-pulmonary distress. Afebrile. HEENT No facial trauma or asymetry. Sinuses non tender.  Extra occullar muscles intact, External ears normal, tympanic membranes clear. Oropharynx moist, no exudate. Neck: supple, no adenopathy,JVD or thyromegaly.No bruits.  Chest: Clear to ascultation bilaterally.No crackles or wheezes. Non tender to palpation  Breast: No asymetry,no masses or lumps. No tenderness. No nipple discharge or inversion. No axillary or supraclavicular adenopathy  Cardiovascular system; Heart sounds normal,  S1 and  S2 ,no S3.  No murmur, or thrill. Apical beat not displaced Peripheral pulses normal.  Abdomen: Soft, superficial generalized tenderness, no organomegaly or masses. No bruits. Hyperactive bowel sounds, no guarding rebound.  Rectal:  Deferred due to acute GI infection.  GU: Not examined, asymptomatic, post hysterectomy and has completed screening   Musculoskeletal exam: Decreased though adequate  ROM of spine,  Adequate In hips , shoulders and knees. No deformity ,swelling or crepitus noted. No muscle wasting or atrophy.   Neurologic: Cranial nerves 2 to 12 intact. Power, tone ,sensation and reflexes normal throughout. No disturbance in gait. No tremor.  Skin: Intact, no ulceration, erythema , scaling or rash noted. Pigmentation normal  throughout  Psych; Normal mood and affect. Judgement and concentration normal   Assessment & Plan:  Annual physical exam Annual exam as documented.  Immunization and cancer screening needs are specifically addressed at this visit.   Chewing tobacco nicotine dependence, uncomplicated Patient is asked and  confirms current  Nicotine use.  Five to seven minutes of time is spent in counseling the patient of the need to quit smoking  Advice to quit is delivered clearly specifically in reducing the risk of developing heart disease, having a stroke, or of developing all types of cancer, especially lung and oral cancer. Improvement in breathing and exercise tolerance and quality of life is also discussed, as is the economic benefit.  Assessment of willingness to quit or to make an attempt to quit is made and documented  Assistance in quit attempt is made with several and varied options presented, based on patient's desire and need. These include  literature, local classes available, 1800 QUIT NOW number, OTC and prescription medication.  The GOAL to be NICOTINE FREE is re emphasized.  The patient has set a personal goal of either reduction or discontinuation and follow up is arranged between 6 an 16 weeks.    Essential hypertension Uncontrolled, dose increase in medication wit close follow up DASH diet and commitment to daily physical activity for a minimum of 30 minutes discussed and encouraged, as a part of hypertension management. The importance of attaining a healthy weight is also discussed.  BP/Weight 10/15/2017 06/24/2017 06/09/2017 02/12/2017 01/27/2017 01/24/2017 7/98/9211  Systolic BP 941 740 814 481 856 314 970  Diastolic BP 84 78 82 80  76 94 80  Wt. (Lbs) 214 224 223 214 - 216.04 211.8  BMI 31.6 33.08 32.93 31.6 - 31.9 31.28       Acute gastroenteritis Acute onset of diarrhea associated with nausea. Pt educated re viral gastroenteritis, hand hygiene and the need to keep  adequately hydrated  Information re dash diet is provided She is given stool cards to return for FOB testing

## 2017-10-17 NOTE — Assessment & Plan Note (Signed)

## 2017-10-17 NOTE — Assessment & Plan Note (Signed)
Acute onset of diarrhea associated with nausea. Pt educated re viral gastroenteritis, hand hygiene and the need to keep adequately hydrated  Information re dash diet is provided She is given stool cards to return for FOB testing

## 2017-10-17 NOTE — Assessment & Plan Note (Signed)
Sophia [name] is reminded of the importance of commitment to daily physical activity for 30 minutes or more, as able and the need to limit carbohydrate intake to 30 to 60 grams per meal to help with blood sugar control.   Sophia [name] is reminded of the importance of daily foot exam, annual eye examination, and good blood sugar, blood pressure and cholesterol control. Updated lab needed    Diabetic Labs Latest Ref Rng & Units 06/03/2017 12/25/2016 08/21/2016 03/20/2016 11/20/2015  HbA1c <5.7 % 6.7(H) 6.2(H) 6.4(H) 6.6(H) -  Microalbumin Not estab mg/dL - 13.8 - - -  Micro/Creat Ratio <30 mcg/mg creat - 70(H) - - -  Chol <200 mg/dL 166 176 - 134 -  HDL >50 mg/dL 52 59 - 52 -  Calc LDL <100 mg/dL 79 94 - 58 -  Triglycerides <150 mg/dL 174(H) 114 - 120 -  Creatinine 0.50 - 0.99 mg/dL 0.73 0.86 0.75 0.84 0.75   BP/Weight 10/15/2017 06/24/2017 06/09/2017 02/12/2017 01/27/2017 01/24/2017 06/25/8109  Systolic BP 315 945 859 292 446 286 381  Diastolic BP 84 78 82 80 76 94 80  Wt. (Lbs) 214 224 223 214 - 216.04 211.8  BMI 31.6 33.08 32.93 31.6 - 31.9 31.28   Foot/eye exam completion dates Latest Ref Rng & Units 10/15/2017 01/01/2017  Eye Exam No Retinopathy - -  Foot Form Completion - Done Done

## 2017-10-22 ENCOUNTER — Encounter: Payer: Self-pay | Admitting: Family Medicine

## 2017-10-23 ENCOUNTER — Other Ambulatory Visit (HOSPITAL_COMMUNITY): Payer: Medicare PPO

## 2017-11-15 ENCOUNTER — Other Ambulatory Visit: Payer: Self-pay | Admitting: Family Medicine

## 2017-11-26 ENCOUNTER — Ambulatory Visit (HOSPITAL_COMMUNITY)
Admission: RE | Admit: 2017-11-26 | Discharge: 2017-11-26 | Disposition: A | Discharge status: Home / Self Care | Payer: Medicare PPO | Source: Ambulatory Visit | Attending: Family Medicine | Admitting: Family Medicine

## 2017-11-26 DIAGNOSIS — Z780 Asymptomatic menopausal state: Secondary | ICD-10-CM | POA: Diagnosis not present

## 2017-11-26 DIAGNOSIS — E8940 Asymptomatic postprocedural ovarian failure: Secondary | ICD-10-CM | POA: Diagnosis not present

## 2017-11-26 DIAGNOSIS — Z90710 Acquired absence of both cervix and uterus: Secondary | ICD-10-CM | POA: Diagnosis not present

## 2017-11-26 DIAGNOSIS — Z13820 Encounter for screening for osteoporosis: Secondary | ICD-10-CM | POA: Diagnosis not present

## 2017-11-29 DIAGNOSIS — F330 Major depressive disorder, recurrent, mild: Secondary | ICD-10-CM | POA: Diagnosis not present

## 2017-11-29 DIAGNOSIS — E785 Hyperlipidemia, unspecified: Secondary | ICD-10-CM | POA: Diagnosis not present

## 2017-11-29 DIAGNOSIS — K219 Gastro-esophageal reflux disease without esophagitis: Secondary | ICD-10-CM | POA: Diagnosis not present

## 2017-11-29 DIAGNOSIS — E119 Type 2 diabetes mellitus without complications: Secondary | ICD-10-CM | POA: Diagnosis not present

## 2017-11-29 DIAGNOSIS — Z6831 Body mass index (BMI) 31.0-31.9, adult: Secondary | ICD-10-CM | POA: Diagnosis not present

## 2017-11-29 DIAGNOSIS — I10 Essential (primary) hypertension: Secondary | ICD-10-CM | POA: Diagnosis not present

## 2017-11-29 DIAGNOSIS — M545 Low back pain: Secondary | ICD-10-CM | POA: Diagnosis not present

## 2017-11-29 DIAGNOSIS — Z720 Tobacco use: Secondary | ICD-10-CM | POA: Diagnosis not present

## 2017-12-16 ENCOUNTER — Telehealth: Payer: Self-pay | Admitting: Family Medicine

## 2017-12-16 MED ORDER — DILTIAZEM HCL ER COATED BEADS 240 MG PO CP24: 240.0000 mg | ORAL_CAPSULE | Freq: Every day | ORAL | 0 refills | Status: DC

## 2017-12-16 NOTE — Addendum Note (Signed)
Addended by: Merceda Elks on: 12/16/2017 03:40 PM   Modules accepted: Orders

## 2017-12-16 NOTE — Telephone Encounter (Signed)
Done

## 2017-12-16 NOTE — Telephone Encounter (Signed)
Pt stopped by the office, needs refills on Diltiazem CD er 240mg  cap--Called in to [facility]

## 2017-12-26 ENCOUNTER — Other Ambulatory Visit: Payer: Self-pay | Admitting: Family Medicine

## 2018-01-08 ENCOUNTER — Other Ambulatory Visit: Payer: Self-pay | Admitting: Family Medicine

## 2018-01-15 ENCOUNTER — Telehealth: Payer: Self-pay | Admitting: Family Medicine

## 2018-01-15 NOTE — Telephone Encounter (Signed)
Please call in to [facility]   Diltiazem cd er 240 mg cap  Benazepril/hctz 20-12.5 mg tab  CELL [phone] --Please call when completed

## 2018-01-16 ENCOUNTER — Other Ambulatory Visit: Payer: Self-pay

## 2018-01-16 MED ORDER — BENAZEPRIL-HYDROCHLOROTHIAZIDE 20-12.5 MG PO TABS: 2.0000 | ORAL_TABLET | Freq: Every day | ORAL | 1 refills | Status: DC

## 2018-01-16 MED ORDER — DILTIAZEM HCL ER COATED BEADS 240 MG PO CP24: 240.0000 mg | ORAL_CAPSULE | Freq: Every day | ORAL | 1 refills | Status: DC

## 2018-01-16 NOTE — Telephone Encounter (Signed)
Med sent.

## 2018-01-20 DIAGNOSIS — H524 Presbyopia: Secondary | ICD-10-CM | POA: Diagnosis not present

## 2018-01-20 DIAGNOSIS — Z961 Presence of intraocular lens: Secondary | ICD-10-CM | POA: Diagnosis not present

## 2018-01-20 DIAGNOSIS — E119 Type 2 diabetes mellitus without complications: Secondary | ICD-10-CM | POA: Diagnosis not present

## 2018-01-20 LAB — HM DIABETES EYE EXAM

## 2018-03-17 ENCOUNTER — Ambulatory Visit: Payer: Medicare PPO | Admitting: Family Medicine

## 2018-03-17 DIAGNOSIS — E669 Obesity, unspecified: Secondary | ICD-10-CM | POA: Diagnosis not present

## 2018-03-17 DIAGNOSIS — E1169 Type 2 diabetes mellitus with other specified complication: Secondary | ICD-10-CM | POA: Diagnosis not present

## 2018-03-17 DIAGNOSIS — E785 Hyperlipidemia, unspecified: Secondary | ICD-10-CM | POA: Diagnosis not present

## 2018-03-17 LAB — COMPLETE METABOLIC PANEL WITH GFR
AG Ratio: 1.7 (calc) (ref 1.0–2.5)
ALT: 19 U/L (ref 6–29)
AST: 18 U/L (ref 10–35)
Albumin: 4.7 g/dL (ref 3.6–5.1)
Alkaline phosphatase (APISO): 69 U/L (ref 33–130)
BUN: 16 mg/dL (ref 7–25)
CALCIUM: 9.7 mg/dL (ref 8.6–10.4)
CO2: 34 mmol/L — AB (ref 20–32)
CREATININE: 0.74 mg/dL (ref 0.50–0.99)
Chloride: 102 mmol/L (ref 98–110)
GFR, EST NON AFRICAN AMERICAN: 84 mL/min/{1.73_m2} (ref >=60)
GFR, Est African American: 97 mL/min/{1.73_m2} (ref >=60)
GLOBULIN: 2.8 g/dL (ref 1.9–3.7)
GLUCOSE: 109 mg/dL — AB (ref 65–99)
Potassium: 4.4 mmol/L (ref 3.5–5.3)
Sodium: 142 mmol/L (ref 135–146)
Total Bilirubin: 0.3 mg/dL (ref 0.2–1.2)
Total Protein: 7.5 g/dL (ref 6.1–8.1)

## 2018-03-17 LAB — LIPID PANEL
CHOLESTEROL: 157 mg/dL (ref <200)
HDL: 53 mg/dL (ref >50)
LDL Cholesterol (Calc): 86 mg/dL (calc)
NON-HDL CHOLESTEROL (CALC): 104 mg/dL (ref <130)
Total CHOL/HDL Ratio: 3 (calc) (ref <5.0)
Triglycerides: 85 mg/dL (ref <150)

## 2018-03-18 DIAGNOSIS — Z1211 Encounter for screening for malignant neoplasm of colon: Secondary | ICD-10-CM

## 2018-03-18 LAB — HEMOGLOBIN A1C
HEMOGLOBIN A1C: 6.2 %{Hb} — AB (ref <5.7)
MEAN PLASMA GLUCOSE: 131 (calc)
eAG (mmol/L): 7.3 (calc)

## 2018-03-18 LAB — HEMOCCULT GUIAC POC 1CARD (OFFICE)
Card #2 Fecal Occult Blod, POC: NEGATIVE
Card #3 Fecal Occult Blood, POC: NEGATIVE
Fecal Occult Blood, POC: NEGATIVE

## 2018-04-08 ENCOUNTER — Ambulatory Visit: Payer: Medicare PPO | Admitting: Family Medicine

## 2018-04-08 ENCOUNTER — Encounter: Payer: Self-pay | Admitting: Family Medicine

## 2018-04-08 VITALS — BP 160/84 | HR 61 | Resp 16 | Ht 69.0 in | Wt 218.0 lb

## 2018-04-08 DIAGNOSIS — E669 Obesity, unspecified: Secondary | ICD-10-CM

## 2018-04-08 DIAGNOSIS — E66811 Obesity, class 1: Secondary | ICD-10-CM

## 2018-04-08 DIAGNOSIS — E1169 Type 2 diabetes mellitus with other specified complication: Secondary | ICD-10-CM

## 2018-04-08 DIAGNOSIS — E785 Hyperlipidemia, unspecified: Secondary | ICD-10-CM

## 2018-04-08 DIAGNOSIS — F418 Other specified anxiety disorders: Secondary | ICD-10-CM | POA: Diagnosis not present

## 2018-04-08 DIAGNOSIS — F17220 Nicotine dependence, chewing tobacco, uncomplicated: Secondary | ICD-10-CM

## 2018-04-08 DIAGNOSIS — Z1239 Encounter for other screening for malignant neoplasm of breast: Secondary | ICD-10-CM | POA: Diagnosis not present

## 2018-04-08 DIAGNOSIS — I10 Essential (primary) hypertension: Secondary | ICD-10-CM

## 2018-04-08 MED ORDER — BENAZEPRIL-HYDROCHLOROTHIAZIDE 20-12.5 MG PO TABS
2.0000 | ORAL_TABLET | Freq: Every day | ORAL | 1 refills | Status: DC
Start: 2018-04-08 — End: 2018-08-26

## 2018-04-08 MED ORDER — DILTIAZEM HCL ER COATED BEADS 240 MG PO CP24: 240.0000 mg | ORAL_CAPSULE | Freq: Every day | ORAL | 1 refills | Status: DC

## 2018-04-08 MED ORDER — AMLODIPINE BESYLATE 5 MG PO TABS: 5.0000 mg | ORAL_TABLET | Freq: Every day | ORAL | 0 refills | Status: DC

## 2018-04-08 NOTE — Patient Instructions (Addendum)
Please sched ule mammogram due July 26 or after at checkout  Annual physical exam with MD in 5 weeks please  Wellness visit with nurse   In [place], weight loss goal of 5 pounds     CBC, TSH and Vitt D level and non fasting chem 7 5 days befoore next visit  Blood pressure is high, need to start additional medication amlodipine 5 mg one daily, continue the other 2 BP meds you already take, and work on weight loss  CONGRATS on stopping snuff!

## 2018-04-12 ENCOUNTER — Encounter: Payer: Self-pay | Admitting: Family Medicine

## 2018-04-12 NOTE — Assessment & Plan Note (Signed)
Uncontrolled, amlodipine added DASH diet and commitment to daily physical activity for a minimum of 30 minutes discussed and encouraged, as a part of hypertension management. The importance of attaining a healthy weight is also discussed.  BP/Weight 04/08/2018 10/15/2017 06/24/2017 06/09/2017 02/12/2017 7/47/1595 01/25/6727  Systolic BP 979 150 413 643 837 793 968  Diastolic BP 84 84 78 82 80 76 94  Wt. (Lbs) 218 214 224 223 214 - 216.04  BMI 32.19 31.6 33.08 32.93 31.6 - 31.9

## 2018-04-12 NOTE — Assessment & Plan Note (Signed)
Controlled, no change in medication  

## 2018-04-12 NOTE — Progress Notes (Signed)
Sophia [name]     MRN: [id number]      DOB: 1951-05-03   HPI Sophia [name] is here for follow up and re-evaluation of chronic medical conditions, medication management and review of any available recent lab and radiology data.  Preventive health is updated, specifically  Cancer screening and Immunization.   She has discontinue snuff which is wondeful  The PT denies any adverse reactions to current medications since the last visit.  There are no new concerns.  There are no specific complaints   ROS Denies recent fever or chills. Denies sinus pressure, nasal congestion, ear pain or sore throat. Denies chest congestion, productive cough or wheezing. Denies chest pains, palpitations and leg swelling Denies abdominal pain, nausea, vomiting,diarrhea or constipation.   Denies dysuria, frequency, hesitancy or incontinence. Denies uncontrolled joint pain, swelling and limitation in mobility. Denies headaches, seizures, numbness, or tingling. Denies depression, anxiety or insomnia. Denies skin break down or rash.   PE  BP (!) 160/84   Pulse 61   Resp 16   Ht 5\' 9"  (1.753 m)   Wt 218 lb (98.9 kg)   SpO2 96%   BMI 32.19 kg/m   Patient alert and oriented and in no cardiopulmonary distress.  HEENT: No facial asymmetry, EOMI,   oropharynx pink and moist.  Neck supple no JVD, no mass.  Chest: Clear to auscultation bilaterally.  CVS: S1, S2 no murmurs, no S3.Regular rate.  ABD: Soft non tender.   Ext: No edema  MS: Adequate though reduced  ROM lumbar  spine, adequate in shoulders, hips and knees.  Skin: Intact, no ulcerations or rash noted.  Psych: Good eye contact, normal affect. Memory intact not anxious or depressed appearing.  CNS: CN 2-12 intact, power,  normal throughout.no focal deficits noted.   Assessment & Plan Essential hypertension Uncontrolled, amlodipine added DASH diet and commitment to daily physical activity for a minimum of 30 minutes discussed and  encouraged, as a part of hypertension management. The importance of attaining a healthy weight is also discussed.  BP/Weight 04/08/2018 10/15/2017 06/24/2017 06/09/2017 02/12/2017 05/15/3661 07/22/7653  Systolic BP 650 354 656 812 751 700 174  Diastolic BP 84 84 78 82 80 76 94  Wt. (Lbs) 218 214 224 223 214 - 216.04  BMI 32.19 31.6 33.08 32.93 31.6 - 31.9       Diabetes mellitus type 2 in obese [facility]) [name] is reminded of the importance of commitment to daily physical activity for 30 minutes or more, as able and the need to limit carbohydrate intake to 30 to 60 grams per meal to help with blood sugar control.   [name] is reminded of the importance of daily foot exam, annual eye examination, and good blood sugar, blood pressure and cholesterol control. EXCELLENT  Diabetic Labs Latest Ref Rng & Units 03/17/2018 06/03/2017 12/25/2016 08/21/2016 03/20/2016  HbA1c <5.7 % of total Hgb 6.2(H) 6.7(H) 6.2(H) 6.4(H) 6.6(H)  Microalbumin Not estab mg/dL - - 13.8 - -  Micro/Creat Ratio <30 mcg/mg creat - - 70(H) - -  Chol <200 mg/dL 157 166 176 - 134  HDL >50 mg/dL 53 52 59 - 52  Calc LDL mg/dL (calc) 86 79 94 - 58  Triglycerides <150 mg/dL 85 174(H) 114 - 120  Creatinine 0.50 - 0.99 mg/dL 0.74 0.73 0.86 0.75 0.84   BP/Weight 04/08/2018 10/15/2017 06/24/2017 06/09/2017 02/12/2017 9/44/9675 07/20/6383  Systolic BP 665 993 570 177 939 030 092  Diastolic BP 84 84 78 82 80  76 94  Wt. (Lbs) 218 214 224 223 214 - 216.04  BMI 32.19 31.6 33.08 32.93 31.6 - 31.9   Foot/eye exam completion dates Latest Ref Rng & Units 10/15/2017 01/01/2017  Eye Exam No Retinopathy - -  Foot Form Completion - Done Done        Depression with anxiety Controlled, no change in medication   Obesity (BMI 30-39.9) Deteriorated. Patient re-educated about  the importance of commitment to a  minimum of 150 minutes of exercise per week.  The importance of healthy food choices with portion control discussed. Encouraged to start a  food diary, count calories and to consider  joining a support group. Sample diet sheets offered. Goals set by the patient for the next several months.   Weight /BMI 04/08/2018 10/15/2017 06/24/2017  WEIGHT 218 lb 214 lb 224 lb  HEIGHT 5\' 9"  5\' 9"  5\' 9"   BMI 32.19 kg/m2 31.6 kg/m2 33.08 kg/m2

## 2018-04-12 NOTE — Assessment & Plan Note (Signed)
Sophia [name] is reminded of the importance of commitment to daily physical activity for 30 minutes or more, as able and the need to limit carbohydrate intake to 30 to 60 grams per meal to help with blood sugar control.   Sophia [name] is reminded of the importance of daily foot exam, annual eye examination, and good blood sugar, blood pressure and cholesterol control. EXCELLENT  Diabetic Labs Latest Ref Rng & Units 03/17/2018 06/03/2017 12/25/2016 08/21/2016 03/20/2016  HbA1c <5.7 % of total Hgb 6.2(H) 6.7(H) 6.2(H) 6.4(H) 6.6(H)  Microalbumin Not estab mg/dL - - 13.8 - -  Micro/Creat Ratio <30 mcg/mg creat - - 70(H) - -  Chol <200 mg/dL 157 166 176 - 134  HDL >50 mg/dL 53 52 59 - 52  Calc LDL mg/dL (calc) 86 79 94 - 58  Triglycerides <150 mg/dL 85 174(H) 114 - 120  Creatinine 0.50 - 0.99 mg/dL 0.74 0.73 0.86 0.75 0.84   BP/Weight 04/08/2018 10/15/2017 06/24/2017 06/09/2017 02/12/2017 02/14/761 12/24/3333  Systolic BP 456 256 389 373 428 768 115  Diastolic BP 84 84 78 82 80 76 94  Wt. (Lbs) 218 214 224 223 214 - 216.04  BMI 32.19 31.6 33.08 32.93 31.6 - 31.9   Foot/eye exam completion dates Latest Ref Rng & Units 10/15/2017 01/01/2017  Eye Exam No Retinopathy - -  Foot Form Completion - Done Done

## 2018-04-12 NOTE — Assessment & Plan Note (Signed)
Deteriorated. Patient re-educated about  the importance of commitment to a  minimum of 150 minutes of exercise per week.  The importance of healthy food choices with portion control discussed. Encouraged to start a food diary, count calories and to consider  joining a support group. Sample diet sheets offered. Goals set by the patient for the next several months.   Weight /BMI 04/08/2018 10/15/2017 06/24/2017  WEIGHT 218 lb 214 lb 224 lb  HEIGHT 5\' 9"  5\' 9"  5\' 9"   BMI 32.19 kg/m2 31.6 kg/m2 33.08 kg/m2

## 2018-04-14 ENCOUNTER — Telehealth: Payer: Self-pay | Admitting: Family Medicine

## 2018-04-14 ENCOUNTER — Other Ambulatory Visit: Payer: Self-pay

## 2018-04-14 NOTE — Telephone Encounter (Signed)
Please send the medication to [facility] at fax [phone]

## 2018-04-16 NOTE — Telephone Encounter (Signed)
Med clarification sent back to [facility]

## 2018-05-18 DIAGNOSIS — I10 Essential (primary) hypertension: Secondary | ICD-10-CM | POA: Diagnosis not present

## 2018-05-18 DIAGNOSIS — E559 Vitamin D deficiency, unspecified: Secondary | ICD-10-CM | POA: Diagnosis not present

## 2018-05-19 LAB — TSH: TSH: 2.77 mIU/L (ref 0.40–4.50)

## 2018-05-19 LAB — CBC
HEMATOCRIT: 36.9 % (ref 35.0–45.0)
HEMOGLOBIN: 12 g/dL (ref 11.7–15.5)
MCH: 25.3 pg — ABNORMAL LOW (ref 27.0–33.0)
MCHC: 32.5 g/dL (ref 32.0–36.0)
MCV: 77.8 fL — AB (ref 80.0–100.0)
MPV: 11.7 fL (ref 7.5–12.5)
Platelets: 221 10*3/uL (ref 140–400)
RBC: 4.74 10*6/uL (ref 3.80–5.10)
RDW: 14.8 % (ref 11.0–15.0)
WBC: 5.6 10*3/uL (ref 3.8–10.8)

## 2018-05-19 LAB — BASIC METABOLIC PANEL
BUN: 19 mg/dL (ref 7–25)
CO2: 29 mmol/L (ref 20–32)
CREATININE: 0.86 mg/dL (ref 0.50–0.99)
Calcium: 9.7 mg/dL (ref 8.6–10.4)
Chloride: 101 mmol/L (ref 98–110)
GLUCOSE: 111 mg/dL — AB (ref 65–99)
POTASSIUM: 3.9 mmol/L (ref 3.5–5.3)
Sodium: 140 mmol/L (ref 135–146)

## 2018-05-19 LAB — VITAMIN D 25 HYDROXY (VIT D DEFICIENCY, FRACTURES): VIT D 25 HYDROXY: 38 ng/mL (ref 30–100)

## 2018-05-20 ENCOUNTER — Ambulatory Visit (INDEPENDENT_AMBULATORY_CARE_PROVIDER_SITE_OTHER): Payer: Medicare PPO | Admitting: Family Medicine

## 2018-05-20 ENCOUNTER — Encounter: Payer: Self-pay | Admitting: Family Medicine

## 2018-05-20 ENCOUNTER — Other Ambulatory Visit: Payer: Self-pay

## 2018-05-20 VITALS — BP 158/90 | HR 71 | Ht 69.0 in | Wt 215.1 lb

## 2018-05-20 DIAGNOSIS — Z0000 Encounter for general adult medical examination without abnormal findings: Secondary | ICD-10-CM | POA: Diagnosis not present

## 2018-05-20 DIAGNOSIS — I10 Essential (primary) hypertension: Secondary | ICD-10-CM

## 2018-05-20 DIAGNOSIS — E119 Type 2 diabetes mellitus without complications: Secondary | ICD-10-CM | POA: Diagnosis not present

## 2018-05-20 DIAGNOSIS — E1169 Type 2 diabetes mellitus with other specified complication: Secondary | ICD-10-CM

## 2018-05-20 DIAGNOSIS — E669 Obesity, unspecified: Secondary | ICD-10-CM

## 2018-05-20 MED ORDER — AMLODIPINE BESYLATE 10 MG PO TABS: 10.0000 mg | ORAL_TABLET | Freq: Every day | ORAL | 3 refills | Status: DC

## 2018-05-20 NOTE — Progress Notes (Signed)
Sophia [name]     MRN: [id number]      DOB: 04-14-1951  HPI: Patient is in for annual physical exam. Blood pressure is uncontrolled and medication adjustment is made  Recent labs, are reviewed. Immunization is reviewed , and  updated if needed.   PE: BP (!) 142/80 (BP Location: Left Arm, Patient Position: Sitting, Cuff Size: Large)   Pulse 71   Ht 5\' 9"  (1.753 m)   Wt 215 lb 1.9 oz (97.6 kg)   SpO2 97%   BMI 31.77 kg/m   Pleasant  female, alert and oriented x 3, in no cardio-pulmonary distress. Afebrile. HEENT No facial trauma or asymetry. Sinuses non tender.  Extra occullar muscles intact, . External ears normal, tympanic membranes clear. Oropharynx moist, no exudate. Neck: supple, no adenopathy,JVD or thyromegaly.No bruits.  Chest: Clear to ascultation bilaterally.No crackles or wheezes. Non tender to palpation  Breast: No asymetry,no masses or lumps. No tenderness. No nipple discharge or inversion. No axillary or supraclavicular adenopathy  Cardiovascular system; Heart sounds normal,  S1 and  S2 ,no S3.  No murmur, or thrill. Apical beat not displaced Peripheral pulses normal.  Abdomen: Soft, non tender, no organomegaly or masses. No bruits. Bowel sounds normal. No guarding, tenderness or rebound.  Rectal:  Stool cards x 3  GU: Asymptomatic , not examined, no exam indicated  Musculoskeletal exam: Full ROM of spine, hips , shoulders and knees. No deformity ,swelling or crepitus noted. No muscle wasting or atrophy.   Neurologic: Cranial nerves 2 to 12 intact. Power, tone ,sensation and reflexes normal throughout. No disturbance in gait. No tremor.  Skin: Intact, no ulceration, erythema , scaling or rash noted. Pigmentation normal throughout  Psych; Normal mood, patient somewhat anxious due to ill health of her family members. Judgement and concentration normal   Assessment & Plan:  Annual physical exam Annual exam as documented. Counseling  done  re healthy lifestyle involving commitment to 150 minutes exercise per week, heart healthy diet, and attaining healthy weight.The importance of adequate sleep also discussed. Regular seat belt use and home safety, is also discussed. Changes in health habits are decided on by the patient with goals and time frames  set for achieving them. Immunization and cancer screening needs are specifically addressed at this visit.   Essential hypertension Uncontrolled, increase amlodipine to 10 mg daily and follow up in 6 to 8 weeks will need EKG at that visit DASH diet and commitment to daily physical activity for a minimum of 30 minutes discussed and encouraged, as a part of hypertension management. The importance of attaining a healthy weight is also discussed.  BP/Weight 05/20/2018 04/08/2018 10/15/2017 06/24/2017 06/09/2017 02/12/2017 05/27/6268  Systolic BP 485 462 703 500 938 182 993  Diastolic BP 90 84 84 78 82 80 76  Wt. (Lbs) 215.12 218 214 224 223 214 -  BMI 31.77 32.19 31.6 33.08 32.93 31.6 -       Diabetes mellitus type 2 in obese [facility]) Sophia [name] is reminded of the importance of commitment to daily physical activity for 30 minutes or more, as able and the need to limit carbohydrate intake to 30 to 60 grams per meal to help with blood sugar control.    Sophia [name] is reminded of the importance of daily foot exam, annual eye examination, and good blood sugar, blood pressure and cholesterol control. Updated lab by October  Diabetic Labs Latest Ref Rng & Units 05/18/2018 03/17/2018 06/03/2017 12/25/2016 08/21/2016  HbA1c <5.7 %  of total Hgb - 6.2(H) 6.7(H) 6.2(H) 6.4(H)  Microalbumin Not estab mg/dL - - - 13.8 -  Micro/Creat Ratio <30 mcg/mg creat - - - 70(H) -  Chol <200 mg/dL - 157 166 176 -  HDL >50 mg/dL - 53 52 59 -  Calc LDL mg/dL (calc) - 86 79 94 -  Triglycerides <150 mg/dL - 85 174(H) 114 -  Creatinine 0.50 - 0.99 mg/dL 0.86 0.74 0.73 0.86 0.75   BP/Weight 05/20/2018 04/08/2018  10/15/2017 06/24/2017 06/09/2017 02/12/2017 5/59/7416  Systolic BP 384 536 468 032 122 482 500  Diastolic BP 90 84 84 78 82 80 76  Wt. (Lbs) 215.12 218 214 224 223 214 -  BMI 31.77 32.19 31.6 33.08 32.93 31.6 -   Foot/eye exam completion dates Latest Ref Rng & Units 05/20/2018 10/15/2017  Eye Exam No Retinopathy - -  Foot Form Completion - Done Done

## 2018-05-20 NOTE — Patient Instructions (Addendum)
F/u in  Early September, call if you need me before  We will schedule your mammogram and call you on [phone] with app  Blood pressure elevated at visit still, you arestressed with family illness, I am changing amlodipine to 10 mg one daily, you may take two 5mg  tablets daily till done, I am sending the new dose today  It is important that you exercise regularly at least 30 minutes 5 times a week. If you develop chest pain, have severe difficulty breathing, or feel very tired, stop exercising immediately and seek medical attention   Eat mainly, vegetable, fruit, chicken, fish, [place] and beans, no salt, water

## 2018-05-22 ENCOUNTER — Encounter: Payer: Self-pay | Admitting: Family Medicine

## 2018-05-22 NOTE — Assessment & Plan Note (Signed)
Sophia [name] is reminded of the importance of commitment to daily physical activity for 30 minutes or more, as able and the need to limit carbohydrate intake to 30 to 60 grams per meal to help with blood sugar control.    Sophia [name] is reminded of the importance of daily foot exam, annual eye examination, and good blood sugar, blood pressure and cholesterol control. Updated lab by October  Diabetic Labs Latest Ref Rng & Units 05/18/2018 03/17/2018 06/03/2017 12/25/2016 08/21/2016  HbA1c <5.7 % of total Hgb - 6.2(H) 6.7(H) 6.2(H) 6.4(H)  Microalbumin Not estab mg/dL - - - 13.8 -  Micro/Creat Ratio <30 mcg/mg creat - - - 70(H) -  Chol <200 mg/dL - 157 166 176 -  HDL >50 mg/dL - 53 52 59 -  Calc LDL mg/dL (calc) - 86 79 94 -  Triglycerides <150 mg/dL - 85 174(H) 114 -  Creatinine 0.50 - 0.99 mg/dL 0.86 0.74 0.73 0.86 0.75   BP/Weight 05/20/2018 04/08/2018 10/15/2017 06/24/2017 06/09/2017 02/12/2017 4/88/8916  Systolic BP 945 038 882 800 349 179 150  Diastolic BP 90 84 84 78 82 80 76  Wt. (Lbs) 215.12 218 214 224 223 214 -  BMI 31.77 32.19 31.6 33.08 32.93 31.6 -   Foot/eye exam completion dates Latest Ref Rng & Units 05/20/2018 10/15/2017  Eye Exam No Retinopathy - -  Foot Form Completion - Done Done

## 2018-05-22 NOTE — Assessment & Plan Note (Signed)

## 2018-05-22 NOTE — Assessment & Plan Note (Signed)
Uncontrolled, increase amlodipine to 10 mg daily and follow up in 6 to 8 weeks will need EKG at that visit DASH diet and commitment to daily physical activity for a minimum of 30 minutes discussed and encouraged, as a part of hypertension management. The importance of attaining a healthy weight is also discussed.  BP/Weight 05/20/2018 04/08/2018 10/15/2017 06/24/2017 06/09/2017 02/12/2017 5/91/6384  Systolic BP 665 993 570 177 939 030 092  Diastolic BP 90 84 84 78 82 80 76  Wt. (Lbs) 215.12 218 214 224 223 214 -  BMI 31.77 32.19 31.6 33.08 32.93 31.6 -

## 2018-06-01 ENCOUNTER — Ambulatory Visit: Payer: Medicare PPO

## 2018-06-02 ENCOUNTER — Other Ambulatory Visit: Payer: Self-pay | Admitting: Family Medicine

## 2018-06-08 ENCOUNTER — Other Ambulatory Visit (HOSPITAL_COMMUNITY)
Admission: RE | Admit: 2018-06-08 | Discharge: 2018-06-08 | Disposition: A | Discharge status: Home / Self Care | Payer: Medicare PPO | Source: Ambulatory Visit | Attending: Family Medicine | Admitting: Family Medicine

## 2018-06-08 ENCOUNTER — Ambulatory Visit (INDEPENDENT_AMBULATORY_CARE_PROVIDER_SITE_OTHER): Payer: Medicare PPO | Admitting: Family Medicine

## 2018-06-08 ENCOUNTER — Encounter: Payer: Self-pay | Admitting: Family Medicine

## 2018-06-08 ENCOUNTER — Other Ambulatory Visit: Payer: Self-pay

## 2018-06-08 VITALS — BP 134/82 | HR 75 | Resp 14 | Ht 69.0 in | Wt 217.0 lb

## 2018-06-08 DIAGNOSIS — M549 Dorsalgia, unspecified: Secondary | ICD-10-CM

## 2018-06-08 DIAGNOSIS — R252 Cramp and spasm: Secondary | ICD-10-CM

## 2018-06-08 DIAGNOSIS — F410 Panic disorder [episodic paroxysmal anxiety] without agoraphobia: Secondary | ICD-10-CM | POA: Diagnosis not present

## 2018-06-08 DIAGNOSIS — F17220 Nicotine dependence, chewing tobacco, uncomplicated: Secondary | ICD-10-CM

## 2018-06-08 DIAGNOSIS — R0609 Other forms of dyspnea: Secondary | ICD-10-CM | POA: Diagnosis not present

## 2018-06-08 DIAGNOSIS — Z9189 Other specified personal risk factors, not elsewhere classified: Secondary | ICD-10-CM | POA: Diagnosis not present

## 2018-06-08 DIAGNOSIS — I10 Essential (primary) hypertension: Secondary | ICD-10-CM | POA: Diagnosis not present

## 2018-06-08 LAB — BASIC METABOLIC PANEL
ANION GAP: 8 (ref 5–15)
BUN: 18 mg/dL (ref 8–23)
CALCIUM: 9.4 mg/dL (ref 8.9–10.3)
CO2: 32 mmol/L (ref 22–32)
CREATININE: 0.76 mg/dL (ref 0.44–1.00)
Chloride: 100 mmol/L (ref 98–111)
GLUCOSE: 120 mg/dL — AB (ref 70–99)
Potassium: 3.8 mmol/L (ref 3.5–5.1)
Sodium: 140 mmol/L (ref 135–145)

## 2018-06-08 LAB — MAGNESIUM: Magnesium: 1.9 mg/dL (ref 1.7–2.4)

## 2018-06-08 MED ORDER — ALPRAZOLAM 1 MG PO TABS: ORAL_TABLET | ORAL | 0 refills | Status: DC

## 2018-06-08 NOTE — Patient Instructions (Addendum)
F/U as before, call if you need me sooner   Chem 7 and EGFr, Magnesium today for leg cramps  You are being referred to cardiology because of exertional fatigue and excess sweating with activity in the past 1 week   Blood  pressure and heart rate are  Good  The night you woke up feeling so sick sounds like a combination of a nightmare, and panic  Reduce gabapentin take one at bedtime for 1 week then stop if able use tylennol for pain

## 2018-06-08 NOTE — Progress Notes (Signed)
   Sophia [name]     MRN: [id number]      DOB: 1951-10-04   HPI Sophia [name] is here Reports that she woke up from her sleep this past Friday feeling as though her legs were being cut off and with severe cramping of the legs. Wet on herself. Felt as though unable to breathe and  got hot, chest felt tight. Called EMS , rthm strip brought in showing no signs of ischenia, and NSR.BP at the time was also within normal. Pt in concerned as to what could have been happening to her, wonders if she was having  astroke or had one,she has no recall of any inability to move any body part or disturbance in vision or change in sensation. There is no h/o witnessed jerking. She does not recall a nightmare Has never had cardiology evaluation despite her increased risk of heart disease so appropriate for a referral even for an echocardiolgram. Does report increased exertional fatigue and the need to rest in the last  week  ROS See HPI  Denies recent fever or chills. Denies sinus pressure, nasal congestion, ear pain or sore throat. Denies chest congestion, productive cough or wheezing. Denies chest pains, palpitations and leg swelling Denies abdominal pain, nausea, vomiting,diarrhea or constipation.   Denies dysuria, frequency, hesitancy or incontinence. Denies uncontrolled  joint pain, swelling and limitation in mobility. Denies headaches, seizures, numbness, or tingling. Denies uncontrolled  depression, anxiety or insomnia. Denies skin break down or rash.   PE  BP 134/82 (BP Location: Left Arm, Patient Position: Sitting, Cuff Size: Normal)   Pulse 75   Ht 5\' 9"  (1.753 m)   Wt 217 lb (98.4 kg)   SpO2 95%   BMI 32.05 kg/m   Patient alert and oriented and in no cardiopulmonary distress.  HEENT: No facial asymmetry, EOMI,   oropharynx pink and moist.  Neck supple no JVD, no mass.  Chest: Clear to auscultation bilaterally.  CVS: S1, S2 no murmurs, no S3.Regular rate.  ABD: Soft non tender.    Ext: No edema  MS: Adequate ROM spine, shoulders, hips and knees.  Skin: Intact, no ulcerations or rash noted.  Psych: Good eye contact, normal affect. Memory intact  anxious or depressed appearing.  CNS: CN 2-12 intact, power,  normal throughout.no focal deficits noted.   Assessment & Plan  Dyspnea on minimal exertion 1 week h/o increased exertional fatigue in elderly patient with increased risk for CV event, refer to cardiology for further evaluation  At risk for cardiovascular event Pt with diabetes, hTN and hyperlipidemia, past h/o snuff dependence now  c/o increased exertional fatigue refer to cardiology for eval  Back pain with radiation Decrease dependence of gabapentin plamn is to change to tylenol   Essential hypertension Controlled, no change in medication    Chewing tobacco nicotine dependence, uncomplicated Remains nicotine free and is applauded on this  Leg cramps Check electrolytes, esp calcium , magnesiium and potassium, advise OTC magnesium 1 daily  Panic attack Panic atack  Following nightmare, limited supply of xananx prescribed , pt warned of sedative and addictive s/e and need to limit use

## 2018-06-10 ENCOUNTER — Other Ambulatory Visit: Payer: Self-pay | Admitting: Family Medicine

## 2018-06-10 MED ORDER — PREDNISONE 5 MG PO TABS: ORAL_TABLET | ORAL | 0 refills | Status: DC

## 2018-06-15 ENCOUNTER — Encounter: Payer: Self-pay | Admitting: Family Medicine

## 2018-06-15 DIAGNOSIS — F410 Panic disorder [episodic paroxysmal anxiety] without agoraphobia: Secondary | ICD-10-CM | POA: Insufficient documentation

## 2018-06-15 DIAGNOSIS — R0609 Other forms of dyspnea: Secondary | ICD-10-CM | POA: Insufficient documentation

## 2018-06-15 DIAGNOSIS — Z9189 Other specified personal risk factors, not elsewhere classified: Secondary | ICD-10-CM | POA: Insufficient documentation

## 2018-06-15 DIAGNOSIS — R252 Cramp and spasm: Secondary | ICD-10-CM | POA: Insufficient documentation

## 2018-06-15 NOTE — Assessment & Plan Note (Signed)
Remains nicotine free and is applauded on this

## 2018-06-15 NOTE — Assessment & Plan Note (Addendum)
Pt with diabetes, hTN and hyperlipidemia, past h/o snuff dependence now  c/o increased exertional fatigue refer to cardiology for eval

## 2018-06-15 NOTE — Assessment & Plan Note (Signed)
Controlled, no change in medication  

## 2018-06-15 NOTE — Assessment & Plan Note (Addendum)
Panic atack  Following nightmare, limited supply of xananx prescribed , pt warned of sedative and addictive s/e and need to limit use

## 2018-06-15 NOTE — Assessment & Plan Note (Signed)
Decrease dependence of gabapentin plamn is to change to tylenol

## 2018-06-15 NOTE — Assessment & Plan Note (Signed)
1 week h/o increased exertional fatigue in elderly patient with increased risk for CV event, refer to cardiology for further evaluation

## 2018-06-15 NOTE — Assessment & Plan Note (Signed)
Check electrolytes, esp calcium , magnesiium and potassium, advise OTC magnesium 1 daily

## 2018-06-22 ENCOUNTER — Encounter: Payer: Self-pay | Admitting: Family Medicine

## 2018-06-22 ENCOUNTER — Other Ambulatory Visit: Payer: Self-pay

## 2018-06-22 ENCOUNTER — Emergency Department (HOSPITAL_COMMUNITY): Payer: Medicare PPO

## 2018-06-22 ENCOUNTER — Observation Stay (HOSPITAL_COMMUNITY)
Admission: EM | Admit: 2018-06-22 | Discharge: 2018-06-24 | Disposition: A | Discharge status: Home / Self Care | Payer: Medicare PPO | Attending: Internal Medicine | Admitting: Internal Medicine

## 2018-06-22 ENCOUNTER — Ambulatory Visit (INDEPENDENT_AMBULATORY_CARE_PROVIDER_SITE_OTHER): Payer: Medicare PPO | Admitting: Family Medicine

## 2018-06-22 ENCOUNTER — Encounter (HOSPITAL_COMMUNITY): Payer: Self-pay | Admitting: Emergency Medicine

## 2018-06-22 VITALS — BP 120/68 | HR 76 | Resp 12 | Ht 69.0 in | Wt 217.1 lb

## 2018-06-22 DIAGNOSIS — Z7982 Long term (current) use of aspirin: Secondary | ICD-10-CM | POA: Diagnosis not present

## 2018-06-22 DIAGNOSIS — I700 Atherosclerosis of aorta: Secondary | ICD-10-CM | POA: Diagnosis not present

## 2018-06-22 DIAGNOSIS — Z833 Family history of diabetes mellitus: Secondary | ICD-10-CM | POA: Insufficient documentation

## 2018-06-22 DIAGNOSIS — E1169 Type 2 diabetes mellitus with other specified complication: Secondary | ICD-10-CM | POA: Diagnosis not present

## 2018-06-22 DIAGNOSIS — Z87891 Personal history of nicotine dependence: Secondary | ICD-10-CM | POA: Diagnosis not present

## 2018-06-22 DIAGNOSIS — I110 Hypertensive heart disease with heart failure: Secondary | ICD-10-CM | POA: Insufficient documentation

## 2018-06-22 DIAGNOSIS — J9811 Atelectasis: Secondary | ICD-10-CM | POA: Diagnosis not present

## 2018-06-22 DIAGNOSIS — Z79899 Other long term (current) drug therapy: Secondary | ICD-10-CM | POA: Diagnosis not present

## 2018-06-22 DIAGNOSIS — Z8261 Family history of arthritis: Secondary | ICD-10-CM | POA: Insufficient documentation

## 2018-06-22 DIAGNOSIS — I5030 Unspecified diastolic (congestive) heart failure: Secondary | ICD-10-CM | POA: Insufficient documentation

## 2018-06-22 DIAGNOSIS — Z9889 Other specified postprocedural states: Secondary | ICD-10-CM | POA: Insufficient documentation

## 2018-06-22 DIAGNOSIS — E669 Obesity, unspecified: Secondary | ICD-10-CM | POA: Diagnosis not present

## 2018-06-22 DIAGNOSIS — Z8249 Family history of ischemic heart disease and other diseases of the circulatory system: Secondary | ICD-10-CM | POA: Diagnosis not present

## 2018-06-22 DIAGNOSIS — I10 Essential (primary) hypertension: Secondary | ICD-10-CM | POA: Diagnosis present

## 2018-06-22 DIAGNOSIS — R2681 Unsteadiness on feet: Secondary | ICD-10-CM | POA: Diagnosis not present

## 2018-06-22 DIAGNOSIS — Z90710 Acquired absence of both cervix and uterus: Secondary | ICD-10-CM | POA: Diagnosis not present

## 2018-06-22 DIAGNOSIS — F418 Other specified anxiety disorders: Secondary | ICD-10-CM | POA: Diagnosis not present

## 2018-06-22 DIAGNOSIS — Z6832 Body mass index (BMI) 32.0-32.9, adult: Secondary | ICD-10-CM | POA: Insufficient documentation

## 2018-06-22 DIAGNOSIS — I209 Angina pectoris, unspecified: Secondary | ICD-10-CM

## 2018-06-22 DIAGNOSIS — I313 Pericardial effusion (noninflammatory): Secondary | ICD-10-CM | POA: Insufficient documentation

## 2018-06-22 DIAGNOSIS — K219 Gastro-esophageal reflux disease without esophagitis: Secondary | ICD-10-CM | POA: Insufficient documentation

## 2018-06-22 DIAGNOSIS — Z9841 Cataract extraction status, right eye: Secondary | ICD-10-CM | POA: Insufficient documentation

## 2018-06-22 DIAGNOSIS — E785 Hyperlipidemia, unspecified: Secondary | ICD-10-CM | POA: Insufficient documentation

## 2018-06-22 DIAGNOSIS — R0789 Other chest pain: Principal | ICD-10-CM | POA: Insufficient documentation

## 2018-06-22 DIAGNOSIS — R0602 Shortness of breath: Secondary | ICD-10-CM | POA: Diagnosis not present

## 2018-06-22 DIAGNOSIS — R0609 Other forms of dyspnea: Secondary | ICD-10-CM

## 2018-06-22 DIAGNOSIS — R079 Chest pain, unspecified: Secondary | ICD-10-CM | POA: Diagnosis present

## 2018-06-22 DIAGNOSIS — Z9842 Cataract extraction status, left eye: Secondary | ICD-10-CM | POA: Diagnosis not present

## 2018-06-22 DIAGNOSIS — R072 Precordial pain: Secondary | ICD-10-CM

## 2018-06-22 DIAGNOSIS — M1990 Unspecified osteoarthritis, unspecified site: Secondary | ICD-10-CM | POA: Diagnosis not present

## 2018-06-22 LAB — CBC
HCT: 38.5 % (ref 36.0–46.0)
HEMOGLOBIN: 12.2 g/dL (ref 12.0–15.0)
MCH: 25.7 pg — AB (ref 26.0–34.0)
MCHC: 31.7 g/dL (ref 30.0–36.0)
MCV: 81.2 fL (ref 78.0–100.0)
Platelets: 243 10*3/uL (ref 150–400)
RBC: 4.74 MIL/uL (ref 3.87–5.11)
RDW: 16.1 % — ABNORMAL HIGH (ref 11.5–15.5)
WBC: 6.6 10*3/uL (ref 4.0–10.5)

## 2018-06-22 LAB — BASIC METABOLIC PANEL
Anion gap: 8 (ref 5–15)
BUN: 22 mg/dL (ref 8–23)
CHLORIDE: 99 mmol/L (ref 98–111)
CO2: 31 mmol/L (ref 22–32)
CREATININE: 0.95 mg/dL (ref 0.44–1.00)
Calcium: 9.5 mg/dL (ref 8.9–10.3)
GFR calc Af Amer: >60 mL/min (ref >60)
GFR calc non Af Amer: >60 mL/min (ref >60)
GLUCOSE: 110 mg/dL — AB (ref 70–99)
POTASSIUM: 3.9 mmol/L (ref 3.5–5.1)
Sodium: 138 mmol/L (ref 135–145)

## 2018-06-22 LAB — TSH: TSH: 1.62 u[IU]/mL (ref 0.350–4.500)

## 2018-06-22 LAB — TROPONIN I: Troponin I: <0.03 ng/mL (ref <0.03)

## 2018-06-22 LAB — BRAIN NATRIURETIC PEPTIDE: B NATRIURETIC PEPTIDE 5: 14 pg/mL (ref 0.0–100.0)

## 2018-06-22 LAB — MAGNESIUM: MAGNESIUM: 2 mg/dL (ref 1.7–2.4)

## 2018-06-22 LAB — D-DIMER, QUANTITATIVE: D-Dimer, Quant: 0.8 ug/mL-FEU — ABNORMAL HIGH (ref 0.00–0.50)

## 2018-06-22 MED ORDER — ALPRAZOLAM 0.5 MG PO TABS
1.0000 mg | ORAL_TABLET | Freq: Every day | ORAL | Status: DC | PRN
Start: 2018-06-22 — End: 2018-06-24
  Administered 2018-06-23: 21:00:00 1 mg via ORAL
  Filled 2018-06-22 (×2): qty 2

## 2018-06-22 MED ORDER — ONDANSETRON HCL 4 MG/2ML IJ SOLN: 4.0000 mg | Freq: Four times a day (QID) | INTRAMUSCULAR | Status: DC | PRN

## 2018-06-22 MED ORDER — ADULT MULTIVITAMIN W/MINERALS CH
1.0000 | ORAL_TABLET | Freq: Every day | ORAL | Status: DC
  Administered 2018-06-23 – 2018-06-24 (×2): 1 via ORAL
  Filled 2018-06-22 (×2): qty 1

## 2018-06-22 MED ORDER — ASPIRIN EC 81 MG PO TBEC
81.0000 mg | DELAYED_RELEASE_TABLET | Freq: Every day | ORAL | Status: DC
  Administered 2018-06-23 – 2018-06-24 (×2): 81 mg via ORAL
  Filled 2018-06-22 (×2): qty 1

## 2018-06-22 MED ORDER — BENAZEPRIL HCL 10 MG PO TABS
20.0000 mg | ORAL_TABLET | Freq: Every day | ORAL | Status: DC
  Administered 2018-06-22 – 2018-06-24 (×3): 20 mg via ORAL
  Filled 2018-06-22 (×3): qty 2

## 2018-06-22 MED ORDER — ONDANSETRON HCL 4 MG PO TABS: 4.0000 mg | ORAL_TABLET | Freq: Four times a day (QID) | ORAL | Status: DC | PRN

## 2018-06-22 MED ORDER — FLUOXETINE HCL 20 MG PO CAPS
40.0000 mg | ORAL_CAPSULE | Freq: Every day | ORAL | Status: DC
  Administered 2018-06-23 – 2018-06-24 (×2): 40 mg via ORAL
  Filled 2018-06-22 (×2): qty 2

## 2018-06-22 MED ORDER — GABAPENTIN 100 MG PO CAPS
100.0000 mg | ORAL_CAPSULE | Freq: Three times a day (TID) | ORAL | Status: DC
  Administered 2018-06-22 – 2018-06-24 (×4): 100 mg via ORAL
  Filled 2018-06-22 (×4): qty 1

## 2018-06-22 MED ORDER — AMLODIPINE BESYLATE 10 MG PO TABS
10.0000 mg | ORAL_TABLET | Freq: Every day | ORAL | Status: DC
  Administered 2018-06-23 – 2018-06-24 (×2): 10 mg via ORAL
  Filled 2018-06-22: qty 1
  Filled 2018-06-22: qty 2

## 2018-06-22 MED ORDER — SODIUM CHLORIDE 0.9 % IV SOLN: INTRAVENOUS | Status: DC

## 2018-06-22 MED ORDER — LORATADINE 10 MG PO TABS: 10.0000 mg | ORAL_TABLET | Freq: Every day | ORAL | Status: DC | PRN

## 2018-06-22 MED ORDER — ACETAMINOPHEN 325 MG PO TABS: 325.0000 mg | ORAL_TABLET | Freq: Four times a day (QID) | ORAL | Status: DC | PRN

## 2018-06-22 MED ORDER — DILTIAZEM HCL ER COATED BEADS 240 MG PO CP24
240.0000 mg | ORAL_CAPSULE | Freq: Every day | ORAL | Status: DC
  Administered 2018-06-22: 240 mg via ORAL
  Filled 2018-06-22 (×2): qty 1

## 2018-06-22 MED ORDER — HYDROCHLOROTHIAZIDE 12.5 MG PO CAPS
12.5000 mg | ORAL_CAPSULE | Freq: Every day | ORAL | Status: DC
  Administered 2018-06-23 – 2018-06-24 (×2): 12.5 mg via ORAL
  Filled 2018-06-22 (×2): qty 1

## 2018-06-22 MED ORDER — PRAVASTATIN SODIUM 40 MG PO TABS
40.0000 mg | ORAL_TABLET | Freq: Every evening | ORAL | Status: DC
  Administered 2018-06-22: 40 mg via ORAL
  Filled 2018-06-22: qty 1

## 2018-06-22 MED ORDER — MECLIZINE HCL 25 MG PO TABS
25.0000 mg | ORAL_TABLET | Freq: Three times a day (TID) | ORAL | Status: DC | PRN
  Filled 2018-06-22 (×2): qty 1

## 2018-06-22 MED ORDER — IOPAMIDOL (ISOVUE-370) INJECTION 76%
100.0000 mL | Freq: Once | INTRAVENOUS | Status: AC | PRN
  Administered 2018-06-22: 100 mL via INTRAVENOUS

## 2018-06-22 MED ORDER — ASPIRIN EC 81 MG PO TBEC: 81.0000 mg | DELAYED_RELEASE_TABLET | Freq: Every day | ORAL | Status: DC

## 2018-06-22 MED ORDER — ASPIRIN 81 MG PO CHEW
324.0000 mg | CHEWABLE_TABLET | Freq: Once | ORAL | Status: AC
Start: 2018-06-22 — End: 2018-06-22
  Administered 2018-06-22: 324 mg via ORAL
  Filled 2018-06-22: qty 4

## 2018-06-22 MED ORDER — PANTOPRAZOLE SODIUM 40 MG PO TBEC
40.0000 mg | DELAYED_RELEASE_TABLET | Freq: Every day | ORAL | Status: DC
  Administered 2018-06-23 – 2018-06-24 (×2): 40 mg via ORAL
  Filled 2018-06-22 (×2): qty 1

## 2018-06-22 NOTE — Assessment & Plan Note (Signed)
Worsening exertional fatigue associated with chest tightness in patient at increased risk for CV disease, she is pain free at rest, and is taken in a wheelchair to her car and her spouse is to transport her to the ED for assessment. I have spoken directly to the ED physician

## 2018-06-22 NOTE — ED Provider Notes (Signed)
Emergency Department Provider Note   I have reviewed the triage vital signs and the nursing notes.   HISTORY  Chief Complaint Chest Pain   HPI Sophia [name] is a 67 y.o. female who was sent over from her primary care office secondary to chest pain dyspnea on exertion.  Patient states that she has had symptoms for the last 8 to 9 days.  States every time she walks she gets really weak, tired starts having chest tightness with shortness of breath and diaphoresis.  He gets better with rest.  She states a couple days prior to this happening she did wake up in the middle of night with severe left upper abdominal pain and bilateral lower extremity pain.  She felt really short of breath and diaphoretic during that episode as well but when EMS arrived patient states her symptoms improved and they told her it was anxiety.  She also had a fleeting episode of abdominal pain a few days back but that is not persistent.  No diarrhea or constipation.  No rashes. No other associated or modifying symptoms.    Past Medical History:  Diagnosis Date  . Anxiety   . Arthritis   . Depression   . Diabetes mellitus without complication ([place])   . GERD (gastroesophageal reflux disease)   . Hypercholesteremia   . Hypertension   . PONV (postoperative nausea and vomiting)     Patient Active Problem List   Diagnosis Date Noted  . Chest pain 06/22/2018  . Dyspnea on minimal exertion 06/15/2018  . At risk for cardiovascular event 06/15/2018  . Leg cramps 06/15/2018  . Panic attack 06/15/2018  . Tubular adenoma of colon 04/28/2014  . Chewing tobacco nicotine dependence, uncomplicated 62/13/0865  . History of rectal bleeding 02/19/2014  . Back pain with radiation 10/05/2013  . Seasonal allergies 05/01/2011  . Diabetes mellitus type 2 in obese ([place]) 11/28/2010  . Palpitations 05/17/2009  . Obesity (BMI 30-39.9) 02/24/2009  . Hyperlipidemia with target LDL less than 100 06/17/2008  . Depression with  anxiety 11/21/2006  . Essential hypertension 11/21/2006  . GERD 11/21/2006    Past Surgical History:  Procedure Laterality Date  . ABDOMINAL HYSTERECTOMY  1982   tubes and womb, bleeding and ectopic  . ABDOMINAL SURGERY     removal of tumors  . BREAST SURGERY Left 1983   benign tumor  . CATARACT EXTRACTION W/PHACO Right 01/11/2014   Procedure: CATARACT EXTRACTION PHACO AND INTRAOCULAR LENS PLACEMENT (IOC);  Surgeon: Elta Guadeloupe T. Gershon Crane, MD;  Location: [facility];  Service: Ophthalmology;  Laterality: Right;  CDE 5.57  . CATARACT EXTRACTION W/PHACO Left 01/25/2014   Procedure: CATARACT EXTRACTION PHACO AND INTRAOCULAR LENS PLACEMENT (IOC);  Surgeon: Elta Guadeloupe T. Gershon Crane, MD;  Location: [facility];  Service: Ophthalmology;  Laterality: Left;  CDE:5.19  . COLONOSCOPY  2007 [facility]   NL EXAM  . COLONOSCOPY N/A 04/22/2014   Dr. Fields:Normal mucosa in the terminal ileum/Two COLON polyps REMOVED/ Mild diverticulosis in the ascending colon and transverse colon/The LEFT colon IS redundant/Small internal hemorrhoids. Path: tubular adenoma. Next colonoscopy in 5-10 years  . ESOPHAGOGASTRODUODENOSCOPY N/A 04/22/2014   Dr. Fields:MICROCYTIC ANEMIA MOST LIKELY DUE TO ASA/VOLTAREN/Small hiatal hernia/MODERATE Non-erosive gastritis. Negative H.pylori  . 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 UPPER GASTROINTESTINAL ENDOSCOPY  2007 CHEST PAIN   NL EXAM  . YAG LASER APPLICATION Right 7/84/6962   Procedure: YAG LASER APPLICATION;  Surgeon: Elta Guadeloupe T. Gershon Crane, MD;  Location: [facility];  Service: Ophthalmology;  Laterality: Right;  . YAG LASER APPLICATION Left 01/16/6009   Procedure: YAG LASER APPLICATION;  Surgeon: Rutherford Guys, MD;  Location: [facility];  Service: Ophthalmology;  Laterality: Left;      Allergies Patient has no known allergies.  Family History  Problem Relation Age of Onset  . Colon polyps Sister 81  . Diabetes Sister   . Hypertension Sister   . Kidney disease Sister   . Arthritis Father   . Cancer  Father        prostate   . Hypertension Father   . Diabetes Sister   . Hypertension Sister   . Colon cancer Neg Hx     Social History Social History   Tobacco Use  . Smoking status: Former Smoker    Packs/day: 0.25    Years: 20.00    Pack years: 5.00    Types: Cigarettes    Last attempt to quit: 01/05/1990    Years since quitting: 28.4  . Smokeless tobacco: Former Systems developer    Types: Snuff    Quit date: 03/18/2018  Substance Use Topics  . Alcohol use: No  . Drug use: No    Review of Systems  All other systems negative except as documented in the HPI. All pertinent positives and negatives as reviewed in the HPI. ____________________________________________   PHYSICAL EXAM:  VITAL SIGNS: ED Triage Vitals  Enc Vitals Group     BP 06/22/18 1634 127/63     Pulse Rate 06/22/18 1635 66     Resp 06/22/18 1635 14     Temp --      Temp src --      SpO2 06/22/18 1635 94 %     Weight 06/22/18 1629 217 lb (98.4 kg)     Height 06/22/18 1629 5\' 9"  (1.753 m)    Constitutional: Alert and oriented. Well appearing and in no acute distress. Eyes: Conjunctivae are normal. PERRL. EOMI. Head: Atraumatic. Nose: No congestion/rhinnorhea. Mouth/Throat: Mucous membranes are moist.  Oropharynx non-erythematous. Neck: No stridor.  No meningeal signs.   Cardiovascular: Normal rate, regular rhythm. Good peripheral circulation. Grossly normal heart sounds.   Respiratory: Normal respiratory effort.  No retractions. Lungs CTAB. Gastrointestinal: Soft and nontender. No distention.  Musculoskeletal: No lower extremity tenderness nor edema. No gross deformities of extremities. Neurologic:  Normal speech and language. No gross focal neurologic deficits are appreciated.  Skin:  Skin is warm, dry and intact. No rash noted.   ____________________________________________   LABS (all labs ordered are listed, but only abnormal results are displayed)  Labs Reviewed  BASIC METABOLIC PANEL - Abnormal;  Notable for the following components:      Result Value   Glucose, Bld 110 (*)    All other components within normal limits  CBC - Abnormal; Notable for the following components:   MCH 25.7 (*)    RDW 16.1 (*)    All other components within normal limits  D-DIMER, QUANTITATIVE (NOT AT [facility]) - Abnormal; Notable for the following components:   D-Dimer, Quant 0.80 (*)    All other components within normal limits  MRSA PCR SCREENING  TROPONIN I  MAGNESIUM  TSH  BRAIN NATRIURETIC PEPTIDE  TROPONIN I  BASIC METABOLIC PANEL  CBC  TROPONIN I  TROPONIN I   ____________________________________________  EKG   EKG Interpretation  Date/Time:  Monday June 22 2018 16:35:12 EDT Ventricular Rate:  68 PR Interval:    QRS Duration: 90 QT Interval:  422 QTC Calculation: 449 R Axis:  71 Text Interpretation:  Sinus rhythm no significant change from july 2018 Confirmed by Merrily Pew [phone]) on 06/22/2018 5:39:28 PM       ____________________________________________  RADIOLOGY  Dg Chest 2 View  Result Date: 06/22/2018 CLINICAL DATA:  Dyspnea and chest tightness for 6-7 days. EXAM: CHEST - 2 VIEW COMPARISON:  09/02/2014 FINDINGS: Heart is top-normal in size. Mild-to-moderate aortic atherosclerosis without aneurysmal dilatation is noted. There is minimal atelectasis at the lung bases. No overt pulmonary edema, effusion or pneumothorax. Stable thoracic spondylosis without acute osseous appearing abnormality. IMPRESSION: No active cardiopulmonary disease. Aortic atherosclerosis. Bibasilar atelectasis. Electronically Signed   By: Ashley Royalty M.D.   On: 06/22/2018 17:45   Ct Angio Chest Pe W And/or Wo Contrast  Result Date: 06/22/2018 CLINICAL DATA:  67 year old female with acute chest pain and shortness of breath for 5 days. EXAM: CT ANGIOGRAPHY CHEST WITH CONTRAST TECHNIQUE: Multidetector CT imaging of the chest was performed using the standard protocol during bolus administration of  intravenous contrast. Multiplanar CT image reconstructions and MIPs were obtained to evaluate the vascular anatomy. CONTRAST:  110mL ISOVUE-370 IOPAMIDOL (ISOVUE-370) INJECTION 76% COMPARISON:  None. FINDINGS: Cardiovascular: This study is technically borderline and respiratory motion artifact decreases sensitivity in the LOWER lungs. No pulmonary emboli are identified. Cardiomegaly and coronary artery atherosclerotic calcifications noted. Thoracic aortic atherosclerotic calcifications noted without aneurysm or definite dissection. A very small pericardial effusion is noted. Mediastinum/Nodes: No enlarged mediastinal, hilar, or axillary lymph nodes. Thyroid gland, trachea, and esophagus demonstrate no significant findings. Lungs/Pleura: No airspace disease, nodule, mass, consolidation, pleural effusion or pneumothorax. Upper Abdomen: No acute abnormality. Musculoskeletal: No acute or suspicious bony abnormalities. Review of the MIP images confirms the above findings. IMPRESSION: 1. No evidence of pulmonary emboli but study is technically borderline. 2. Cardiomegaly, coronary artery disease and very small pericardial effusion. 3.  Aortic Atherosclerosis (ICD10-I70.0). Electronically Signed   By: Margarette Canada M.D.   On: 06/22/2018 20:30    ____________________________________________   PROCEDURES  Procedure(s) performed:   Procedures   ____________________________________________   INITIAL IMPRESSION / ASSESSMENT AND PLAN / ED COURSE Story concerning for unstable angina vs PE. Will eval for same and if workup negative, will speak with hospitalist regarding admission (HEART score of 6).   CT with no evidence of pulmonary embolus but does show coronary calcifications and small pericardial effusion.  Troponin negative.  EKG normal sinus rhythm.  Aspirin given. Discussed with hospitalist who will admit for further work-up.   Pertinent labs & imaging results that were available during my care of the  patient were reviewed by me and considered in my medical decision making (see chart for details).  ____________________________________________  FINAL CLINICAL IMPRESSION(S) / ED DIAGNOSES  Final diagnoses:  Nonspecific chest pain     MEDICATIONS GIVEN DURING THIS VISIT:  Medications  multivitamin with minerals tablet 1 tablet (has no administration in time range)  acetaminophen (TYLENOL) tablet 325 mg (has no administration in time range)  loratadine (CLARITIN) tablet 10 mg (has no administration in time range)  pravastatin (PRAVACHOL) tablet 40 mg (40 mg Oral Given 06/22/18 2320)  pantoprazole (PROTONIX) EC tablet 40 mg (has no administration in time range)  diltiazem (CARDIZEM CD) 24 hr capsule 240 mg (240 mg Oral Given 06/22/18 2320)  benazepril (LOTENSIN) tablet 20 mg (20 mg Oral Given 06/22/18 2320)  amLODipine (NORVASC) tablet 10 mg (has no administration in time range)  FLUoxetine (PROZAC) capsule 40 mg (has no administration in time range)  ALPRAZolam (XANAX) tablet  1 mg (has no administration in time range)  meclizine (ANTIVERT) tablet 25 mg (has no administration in time range)  gabapentin (NEURONTIN) capsule 100 mg (100 mg Oral Given 06/22/18 2319)  aspirin EC tablet 81 mg (has no administration in time range)  0.9 %  sodium chloride infusion (has no administration in time range)  ondansetron (ZOFRAN) tablet 4 mg (has no administration in time range)    Or  ondansetron (ZOFRAN) injection 4 mg (has no administration in time range)  hydrochlorothiazide (MICROZIDE) capsule 12.5 mg (has no administration in time range)  iopamidol (ISOVUE-370) 76 % injection 100 mL (100 mLs Intravenous Contrast Given 06/22/18 2002)  aspirin chewable tablet 324 mg (324 mg Oral Given 06/22/18 2148)     NEW OUTPATIENT MEDICATIONS STARTED DURING THIS VISIT:  Current Discharge Medication List      Note:  This note was prepared with assistance of Dragon voice recognition software. Occasional  wrong-word or sound-a-like substitutions may have occurred due to the inherent limitations of voice recognition software.   Brit Wernette, Corene Cornea, MD 06/23/18 [phone]

## 2018-06-22 NOTE — Patient Instructions (Signed)
You need to go to the ED for evaluation , I am contacting the ed physician

## 2018-06-22 NOTE — H&P (Signed)
History and Physical    Sophia [name] RDE:[id number] DOB: Dec 10, 1950 DOA: 06/22/2018  PCP: Fayrene Helper, MD  Patient coming from: home  Chief Complaint:  Chest pain  HPI: Sophia [name] is a 67 y.o. female with medical history significant of dm, htn, anxiety comes in with one week of exertional chest pain and sob that lasts until she sits down and rests then it goes away.  No swelling in her legs.  No cough.  No fevers.  No n/v/d.  No chest pain or sob at rest.  No previous CAD. Chest pain feels more like "a squeeze" and not actual pain.  Pt referred for admission for possible underlying ACS.  Review of Systems: As per HPI otherwise 10 point review of systems negative.   Past Medical History:  Diagnosis Date  . Anxiety   . Arthritis   . Depression   . Diabetes mellitus without complication ([place])   . GERD (gastroesophageal reflux disease)   . Hypercholesteremia   . Hypertension   . PONV (postoperative nausea and vomiting)     Past Surgical History:  Procedure Laterality Date  . ABDOMINAL HYSTERECTOMY  1982   tubes and womb, bleeding and ectopic  . ABDOMINAL SURGERY     removal of tumors  . BREAST SURGERY Left 1983   benign tumor  . CATARACT EXTRACTION W/PHACO Right 01/11/2014   Procedure: CATARACT EXTRACTION PHACO AND INTRAOCULAR LENS PLACEMENT (IOC);  Surgeon: Elta Guadeloupe T. Gershon Crane, MD;  Location: [facility];  Service: Ophthalmology;  Laterality: Right;  CDE 5.57  . CATARACT EXTRACTION W/PHACO Left 01/25/2014   Procedure: CATARACT EXTRACTION PHACO AND INTRAOCULAR LENS PLACEMENT (IOC);  Surgeon: Elta Guadeloupe T. Gershon Crane, MD;  Location: [facility];  Service: Ophthalmology;  Laterality: Left;  CDE:5.19  . COLONOSCOPY  2007 [facility]   NL EXAM  . COLONOSCOPY N/A 04/22/2014   Dr. Fields:Normal mucosa in the terminal ileum/Two COLON polyps REMOVED/ Mild diverticulosis in the ascending colon and transverse colon/The LEFT colon IS redundant/Small internal hemorrhoids. Path: tubular adenoma. Next colonoscopy  in 5-10 years  . ESOPHAGOGASTRODUODENOSCOPY N/A 04/22/2014   Dr. Fields:MICROCYTIC ANEMIA MOST LIKELY DUE TO ASA/VOLTAREN/Small hiatal hernia/MODERATE Non-erosive gastritis. Negative H.pylori  . 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 UPPER GASTROINTESTINAL ENDOSCOPY  2007 CHEST PAIN   NL EXAM  . YAG LASER APPLICATION Right 6/31/4970   Procedure: YAG LASER APPLICATION;  Surgeon: Elta Guadeloupe T. Gershon Crane, MD;  Location: [facility];  Service: Ophthalmology;  Laterality: Right;  . YAG LASER APPLICATION Left 2/63/7858   Procedure: YAG LASER APPLICATION;  Surgeon: Rutherford Guys, MD;  Location: [facility];  Service: Ophthalmology;  Laterality: Left;     reports that she quit smoking about 28 years ago. Her smoking use included cigarettes. She has a 5.00 pack-year smoking history. She quit smokeless tobacco use about 3 months ago. Her smokeless tobacco use included snuff. She reports that she does not drink alcohol or use drugs.  No Known Allergies  Family History  Problem Relation Age of Onset  . Colon polyps Sister 16  . Diabetes Sister   . Hypertension Sister   . Kidney disease Sister   . Arthritis Father   . Cancer Father        prostate   . Hypertension Father   . Diabetes Sister   . Hypertension Sister   . Colon cancer Neg Hx     Prior to Admission medications   Medication Sig Start Date End Date Taking? Authorizing Provider  acetaminophen (  TYLENOL) 325 MG tablet Take 325 mg by mouth every 6 (six) hours as needed for moderate pain.   Yes [provider]  ALPRAZolam Duanne Moron) 1 MG tablet One tablet by mouth for extreme anxiety or panic attack Patient taking differently: Take 1 mg by mouth daily as needed for anxiety or sleep. One tablet by mouth for extreme anxiety or panic attack 06/08/18  Yes Fayrene Helper, MD  amLODipine (NORVASC) 10 MG tablet Take 1 tablet (10 mg total) by mouth daily. 05/20/18  Yes Fayrene Helper, MD  aspirin EC 81 MG tablet Take 81 mg by mouth daily.   Yes  [provider]  benazepril-hydrochlorthiazide (LOTENSIN HCT) 20-12.5 MG tablet Take 2 tablets by mouth daily. 04/08/18  Yes Fayrene Helper, MD  Cholecalciferol (VITAMIN D3 PO) Take 2,000 Units by mouth daily.   Yes [provider]  Cyanocobalamin (VITAMIN B-12 PO) Take 1,000 mcg by mouth daily.   Yes [provider]  diltiazem (CARDIZEM CD) 240 MG 24 hr capsule Take 1 capsule (240 mg total) by mouth daily. 04/08/18  Yes Fayrene Helper, MD  FLUoxetine (PROZAC) 40 MG capsule TAKE 1 CAPSULE EVERY DAY 06/02/18  Yes Fayrene Helper, MD  gabapentin (NEURONTIN) 100 MG capsule Take 100 mg by mouth 3 (three) times daily.   Yes [provider]  loratadine (CLARITIN) 10 MG tablet Take 1 tablet (10 mg total) by mouth daily as needed for allergies. 02/12/17  Yes Fayrene Helper, MD  meclizine (ANTIVERT) 25 MG tablet Take 25 mg by mouth 3 (three) times daily as needed for dizziness.   Yes [provider]  Multiple Vitamin (MULTIVITAMIN WITH MINERALS) TABS tablet Take 1 tablet by mouth daily.   Yes [provider]  Omega-3 Fatty Acids (FISH OIL) 1000 MG CAPS Take 1 capsule by mouth daily.   Yes [provider]  pantoprazole (PROTONIX) 40 MG tablet TAKE 1 TABLET EVERY DAY 01/09/18  Yes Fayrene Helper, MD  pravastatin (PRAVACHOL) 40 MG tablet TAKE 1 TABLET EVERY EVENING 01/09/18  Yes Fayrene Helper, MD  temazepam (RESTORIL) 15 MG capsule TAKE 1 CAPSULE AT BEDTIME AS NEEDED FOR SLEEP 10/15/17  Yes Fayrene Helper, MD  UNABLE TO FIND Take 5 mLs by mouth daily as needed (for energy). Black seed black/cumin seed oil. 1 tsp daily   Yes [provider]    Physical Exam: Vitals:   06/22/18 1930 06/22/18 2038 06/22/18 2100 06/22/18 2130  BP: (!) 115/52 (!) 127/42 (!) 106/55 133/72  Pulse:  61 (!) 58 61  Resp: 17 17 14 20   SpO2:  96% 91% 91%  Weight:      Height:          Constitutional: NAD, calm,  comfortable Vitals:   06/22/18 1930 06/22/18 2038 06/22/18 2100 06/22/18 2130  BP: (!) 115/52 (!) 127/42 (!) 106/55 133/72  Pulse:  61 (!) 58 61  Resp: 17 17 14 20   SpO2:  96% 91% 91%  Weight:      Height:       Eyes: PERRL, lids and conjunctivae normal ENMT: Mucous membranes are moist. Posterior pharynx clear of any exudate or lesions.Normal dentition.  Neck: normal, supple, no masses, no thyromegaly Respiratory: clear to auscultation bilaterally, no wheezing, no crackles. Normal respiratory effort. No accessory muscle use.  Cardiovascular: Regular rate and rhythm, no murmurs / rubs / gallops. No extremity edema. 2+ pedal pulses. No carotid bruits.  Abdomen: no tenderness, no masses palpated.  No hepatosplenomegaly. Bowel sounds positive.  Musculoskeletal: no clubbing / cyanosis. No joint deformity upper and lower extremities. Good ROM, no contractures. Normal muscle tone.  Skin: no rashes, lesions, ulcers. No induration Neurologic: CN 2-12 grossly intact. Sensation intact, DTR normal. Strength 5/5 in all 4.  Psychiatric: Normal judgment and insight. Alert and oriented x 3. Normal mood.    Labs on Admission: I have personally reviewed following labs and imaging studies  CBC: Recent Labs  Lab 06/22/18 1638  WBC 6.6  HGB 12.2  HCT 38.5  MCV 81.2  PLT 892   Basic Metabolic Panel: Recent Labs  Lab 06/22/18 1638  NA 138  K 3.9  CL 99  CO2 31  GLUCOSE 110*  BUN 22  CREATININE 0.95  CALCIUM 9.5  MG 2.0   GFR: Estimated Creatinine Clearance: 70.8 mL/min (by C-G formula based on SCr of 0.95 mg/dL). Liver Function Tests: No results for input(s): AST, ALT, ALKPHOS, BILITOT, PROT, ALBUMIN in the last 168 hours. No results for input(s): LIPASE, AMYLASE in the last 168 hours. No results for input(s): AMMONIA in the last 168 hours. Coagulation Profile: No results for input(s): INR, PROTIME in the last 168 hours. Cardiac Enzymes: Recent Labs  Lab 06/22/18 1638  TROPONINI  <0.03   BNP (last 3 results) No results for input(s): PROBNP in the last 8760 hours. HbA1C: No results for input(s): HGBA1C in the last 72 hours. CBG: No results for input(s): GLUCAP in the last 168 hours. Lipid Profile: No results for input(s): CHOL, HDL, LDLCALC, TRIG, CHOLHDL, LDLDIRECT in the last 72 hours. Thyroid Function Tests: Recent Labs    06/22/18 1638  TSH 1.620   Anemia Panel: No results for input(s): VITAMINB12, FOLATE, FERRITIN, TIBC, IRON, RETICCTPCT in the last 72 hours. Urine analysis:    Component Value Date/Time   COLORURINE YELLOW 12/31/2006 0207   APPEARANCEUR CLEAR 12/31/2006 0207   LABSPEC 1.027 12/31/2006 0207   PHURINE 6.0 12/31/2006 0207   GLUCOSEU NEG mg/dL 12/31/2006 0207   BILIRUBINUR neg 03/25/2016 1136   KETONESUR NEG mg/dL 12/31/2006 0207   PROTEINUR 100 03/25/2016 1136   PROTEINUR NEG mg/dL 12/31/2006 0207   UROBILINOGEN 0.2 03/25/2016 1136   UROBILINOGEN 0.2 12/31/2006 0207   NITRITE neg 03/25/2016 1136   NITRITE NEG 12/31/2006 0207   LEUKOCYTESUR small (1+) (A) 03/25/2016 1136   Sepsis Labs: !!!!!!!!!!!!!!!!!!!!!!!!!!!!!!!!!!!!!!!!!!!! @LABRCNTIP (procalcitonin:4,lacticidven:4) )No results found for this or any previous visit (from the past 240 hour(s)).   Radiological Exams on Admission: Dg Chest 2 View  Result Date: 06/22/2018 CLINICAL DATA:  Dyspnea and chest tightness for 6-7 days. EXAM: CHEST - 2 VIEW COMPARISON:  09/02/2014 FINDINGS: Heart is top-normal in size. Mild-to-moderate aortic atherosclerosis without aneurysmal dilatation is noted. There is minimal atelectasis at the lung bases. No overt pulmonary edema, effusion or pneumothorax. Stable thoracic spondylosis without acute osseous appearing abnormality. IMPRESSION: No active cardiopulmonary disease. Aortic atherosclerosis. Bibasilar atelectasis. Electronically Signed   By: Ashley Royalty M.D.   On: 06/22/2018 17:45   Ct Angio Chest Pe W And/or Wo Contrast  Result Date:  06/22/2018 CLINICAL DATA:  67 year old female with acute chest pain and shortness of breath for 5 days. EXAM: CT ANGIOGRAPHY CHEST WITH CONTRAST TECHNIQUE: Multidetector CT imaging of the chest was performed using the standard protocol during bolus administration of intravenous contrast. Multiplanar CT image reconstructions and MIPs were obtained to evaluate the vascular anatomy. CONTRAST:  148mL ISOVUE-370 IOPAMIDOL (ISOVUE-370) INJECTION 76% COMPARISON:  None. FINDINGS: Cardiovascular: This study is technically borderline  and respiratory motion artifact decreases sensitivity in the LOWER lungs. No pulmonary emboli are identified. Cardiomegaly and coronary artery atherosclerotic calcifications noted. Thoracic aortic atherosclerotic calcifications noted without aneurysm or definite dissection. A very small pericardial effusion is noted. Mediastinum/Nodes: No enlarged mediastinal, hilar, or axillary lymph nodes. Thyroid gland, trachea, and esophagus demonstrate no significant findings. Lungs/Pleura: No airspace disease, nodule, mass, consolidation, pleural effusion or pneumothorax. Upper Abdomen: No acute abnormality. Musculoskeletal: No acute or suspicious bony abnormalities. Review of the MIP images confirms the above findings. IMPRESSION: 1. No evidence of pulmonary emboli but study is technically borderline. 2. Cardiomegaly, coronary artery disease and very small pericardial effusion. 3.  Aortic Atherosclerosis (ICD10-I70.0). Electronically Signed   By: Margarette Canada M.D.   On: 06/22/2018 20:30    EKG: Independently reviewed. nsr no acutre issues Old chart reviewed Case discussed with dr Dayna Barker in the ED   Assessment/Plan 67 yo female with chest pain worrisome for underlying ACS Principal Problem:   Chest pain- obs on tele.  Aspirin.  Serial trop.  Check cardiac echo.  If all neg arrange for outpt stress testing.    Active Problems:   Depression with anxiety- noted    Essential hypertension- cont  home meds    Diabetes mellitus type 2 in obese ([place])- noted and controlled at this time     DVT prophylaxis: scds Code Status: full Family Communication:  husband  Disposition Plan:  Likely tom Consults called:  none Admission status:  observation   Esmae Donathan A MD [facility]  If 7PM-7AM, please contact night-coverage [email or web address] Password [email or web address]  06/22/2018, 9:55 PM

## 2018-06-22 NOTE — ED Triage Notes (Signed)
Pt reports cental chest tightness with SOB increasing with exertion X5-6 days. Baby ASA at home qd.

## 2018-06-22 NOTE — Assessment & Plan Note (Signed)
10 day h/o central chest pain with activity and shortness of breath. Patient reports lying in bed for the entire period as a result of these symptoms , presents anxious and tearful. She is at increased risk for CV disease. She is sent to the ED for evaluation and management

## 2018-06-22 NOTE — Progress Notes (Signed)
   VON INSCOE     MRN: [id number]      DOB: 10-Jun-1951   HPI Sophia [name] is here stating that since past 10 days she has been experiencing  Significant shortness of breath and chest tightness with  Minimal activity and that since 7/26 so she has just been lying in bed. Prior to that on 7/24, she had been experiencing back pain and after about 2 days of prednisone she felt much better and stopped the prednisone, following this she has been downhill and extremely short of breath States she "just does not know what is wrong with her but something is not right" Has an appt with cardiology for the end of this month , but is in the office crying afraid that she is very ill, her inactivity and poor exercise tolerance is certainly new for her A few weeks prior she had a panic attack / nightmare when EMS was called after she awoke from her sleep barely able to breath and experiencing sharp pain in the leg as though it was being cut off  ROS See HPI  Denies recent fever or chills. Denies sinus pressure, nasal congestion, ear pain or sore throat. Denies chest congestion, productive cough or wheezing.  Denies abdominal pain, nausea, vomiting,diarrhea or constipation.   Denies dysuria, frequency, hesitancy or incontinence. Denies uncontrolled  joint pain, swelling and limitation in mobility. Denies headaches, seizures, numbness, or tingling. C/o anxiety and fear Denies skin break down or rash.   PE  BP (!) 116/58 (BP Location: Left Arm, Patient Position: Sitting, Cuff Size: Large)   Pulse 76   Resp 12   Ht 5\' 9"  (1.753 m)   Wt 217 lb 1.9 oz (98.5 kg)   SpO2 97% Comment: room air  BMI 32.06 kg/m   Patient alert and oriented and in no cardiopulmonary distress.Anxious and crying and obviously afraid  HEENT: No facial asymmetry, EOMI,   oropharynx pink and moist.  Neck supple no JVD, no mass.  Chest: Clear to auscultation bilaterally.no localized tenderness CVS: S1, S2 no murmurs, no  S3.Regular rate.  ABD: Soft non tender.   Ext: No edema  MS: Adequate ROM spine, shoulders, hips and knees.  Skin: Intact, no ulcerations or rash noted.  Psych: Good eye contact, tearful. Memory intact  anxious and  depressed appearing.  CNS: CN 2-12 intact, power,  normal throughout.no focal deficits noted.   Assessment & Plan  Dyspnea on minimal exertion Worsening exertional fatigue associated with chest tightness in patient at increased risk for CV disease, she is pain free at rest, and is taken in a wheelchair to her car and her spouse is to transport her to the ED for assessment. I have spoken directly to the ED physician  Chest pain 10 day h/o central chest pain with activity and shortness of breath. Patient reports lying in bed for the entire period as a result of these symptoms , presents anxious and tearful. She is at increased risk for CV disease. She is sent to the ED for evaluation and management

## 2018-06-23 ENCOUNTER — Encounter (HOSPITAL_COMMUNITY)
Admission: EM | Disposition: A | Discharge status: Home / Self Care | Payer: Self-pay | Source: Home / Self Care | Attending: Emergency Medicine

## 2018-06-23 ENCOUNTER — Observation Stay (HOSPITAL_BASED_OUTPATIENT_CLINIC_OR_DEPARTMENT_OTHER): Payer: Medicare PPO

## 2018-06-23 ENCOUNTER — Encounter (HOSPITAL_COMMUNITY): Payer: Self-pay | Admitting: Student

## 2018-06-23 DIAGNOSIS — I10 Essential (primary) hypertension: Secondary | ICD-10-CM | POA: Diagnosis not present

## 2018-06-23 DIAGNOSIS — F418 Other specified anxiety disorders: Secondary | ICD-10-CM | POA: Diagnosis not present

## 2018-06-23 DIAGNOSIS — I110 Hypertensive heart disease with heart failure: Secondary | ICD-10-CM | POA: Diagnosis not present

## 2018-06-23 DIAGNOSIS — M1990 Unspecified osteoarthritis, unspecified site: Secondary | ICD-10-CM | POA: Diagnosis not present

## 2018-06-23 DIAGNOSIS — I200 Unstable angina: Secondary | ICD-10-CM | POA: Diagnosis not present

## 2018-06-23 DIAGNOSIS — E1169 Type 2 diabetes mellitus with other specified complication: Secondary | ICD-10-CM | POA: Diagnosis not present

## 2018-06-23 DIAGNOSIS — R072 Precordial pain: Secondary | ICD-10-CM

## 2018-06-23 DIAGNOSIS — E669 Obesity, unspecified: Secondary | ICD-10-CM | POA: Diagnosis not present

## 2018-06-23 DIAGNOSIS — I209 Angina pectoris, unspecified: Secondary | ICD-10-CM | POA: Diagnosis not present

## 2018-06-23 DIAGNOSIS — I5030 Unspecified diastolic (congestive) heart failure: Secondary | ICD-10-CM | POA: Diagnosis not present

## 2018-06-23 DIAGNOSIS — R079 Chest pain, unspecified: Secondary | ICD-10-CM

## 2018-06-23 DIAGNOSIS — R2681 Unsteadiness on feet: Secondary | ICD-10-CM | POA: Diagnosis not present

## 2018-06-23 DIAGNOSIS — E785 Hyperlipidemia, unspecified: Secondary | ICD-10-CM | POA: Diagnosis not present

## 2018-06-23 DIAGNOSIS — R0789 Other chest pain: Secondary | ICD-10-CM | POA: Diagnosis not present

## 2018-06-23 HISTORY — PX: ULTRASOUND GUIDANCE FOR VASCULAR ACCESS: SHX6516

## 2018-06-23 HISTORY — PX: LEFT HEART CATH AND CORONARY ANGIOGRAPHY: CATH118249

## 2018-06-23 LAB — ECHOCARDIOGRAM COMPLETE
HEIGHTINCHES: 69 in
WEIGHTICAEL: 3456.81 [oz_av]

## 2018-06-23 LAB — CBC
HCT: 35.4 % — ABNORMAL LOW (ref 36.0–46.0)
HEMOGLOBIN: 11 g/dL — AB (ref 12.0–15.0)
MCH: 25.4 pg — AB (ref 26.0–34.0)
MCHC: 31.1 g/dL (ref 30.0–36.0)
MCV: 81.8 fL (ref 78.0–100.0)
PLATELETS: 224 10*3/uL (ref 150–400)
RBC: 4.33 MIL/uL (ref 3.87–5.11)
RDW: 16.1 % — ABNORMAL HIGH (ref 11.5–15.5)
WBC: 5.9 10*3/uL (ref 4.0–10.5)

## 2018-06-23 LAB — HEMOGLOBIN A1C
HEMOGLOBIN A1C: 6.7 % — AB (ref 4.8–5.6)
MEAN PLASMA GLUCOSE: 145.59 mg/dL

## 2018-06-23 LAB — LIPID PANEL
CHOL/HDL RATIO: 4.3 ratio
CHOLESTEROL: 178 mg/dL (ref 0–200)
HDL: 41 mg/dL (ref >40)
LDL CALC: 84 mg/dL (ref 0–99)
Triglycerides: 265 mg/dL — ABNORMAL HIGH (ref <150)
VLDL: 53 mg/dL — AB (ref 0–40)

## 2018-06-23 LAB — BASIC METABOLIC PANEL
Anion gap: 6 (ref 5–15)
BUN: 19 mg/dL (ref 8–23)
CALCIUM: 9 mg/dL (ref 8.9–10.3)
CO2: 31 mmol/L (ref 22–32)
Chloride: 103 mmol/L (ref 98–111)
Creatinine, Ser: 0.79 mg/dL (ref 0.44–1.00)
GFR calc non Af Amer: >60 mL/min (ref >60)
Glucose, Bld: 144 mg/dL — ABNORMAL HIGH (ref 70–99)
Potassium: 3.8 mmol/L (ref 3.5–5.1)
SODIUM: 140 mmol/L (ref 135–145)

## 2018-06-23 LAB — GLUCOSE, CAPILLARY
GLUCOSE-CAPILLARY: 93 mg/dL (ref 70–99)
Glucose-Capillary: 79 mg/dL (ref 70–99)
Glucose-Capillary: 124 mg/dL — ABNORMAL HIGH (ref 70–99)

## 2018-06-23 LAB — TROPONIN I

## 2018-06-23 LAB — MRSA PCR SCREENING: MRSA by PCR: NEGATIVE

## 2018-06-23 SURGERY — LEFT HEART CATH AND CORONARY ANGIOGRAPHY
Anesthesia: LOCAL

## 2018-06-23 MED ORDER — SODIUM CHLORIDE 0.9 % IV SOLN
INTRAVENOUS | Status: DC
  Administered 2018-06-23: 100 mL/h via INTRAVENOUS

## 2018-06-23 MED ORDER — HEPARIN (PORCINE) IN NACL 100-0.45 UNIT/ML-% IJ SOLN
1200.0000 [IU]/h | INTRAMUSCULAR | Status: DC
  Administered 2018-06-23: 1200 [IU]/h via INTRAVENOUS
  Filled 2018-06-23: qty 250

## 2018-06-23 MED ORDER — HEPARIN (PORCINE) IN NACL 1000-0.9 UT/500ML-% IV SOLN
INTRAVENOUS | Status: AC
  Filled 2018-06-23: qty 1000

## 2018-06-23 MED ORDER — VERAPAMIL HCL 2.5 MG/ML IV SOLN
INTRAVENOUS | Status: AC
  Filled 2018-06-23: qty 2

## 2018-06-23 MED ORDER — MIDAZOLAM HCL 2 MG/2ML IJ SOLN
INTRAMUSCULAR | Status: DC | PRN
  Administered 2018-06-23: 1 mg via INTRAVENOUS

## 2018-06-23 MED ORDER — SODIUM CHLORIDE 0.9 % IV SOLN: INTRAVENOUS | Status: AC

## 2018-06-23 MED ORDER — SODIUM CHLORIDE 0.9% FLUSH: 3.0000 mL | Freq: Two times a day (BID) | INTRAVENOUS | Status: DC

## 2018-06-23 MED ORDER — SODIUM CHLORIDE 0.9% FLUSH: 3.0000 mL | INTRAVENOUS | Status: DC | PRN

## 2018-06-23 MED ORDER — IOHEXOL 350 MG/ML SOLN
INTRAVENOUS | Status: DC | PRN
  Administered 2018-06-23: 45 mL via INTRA_ARTERIAL

## 2018-06-23 MED ORDER — SODIUM CHLORIDE 0.9 % IV SOLN: 250.0000 mL | INTRAVENOUS | Status: DC | PRN

## 2018-06-23 MED ORDER — HEPARIN (PORCINE) IN NACL 1000-0.9 UT/500ML-% IV SOLN
INTRAVENOUS | Status: DC | PRN
  Administered 2018-06-23: 500 mL

## 2018-06-23 MED ORDER — LIDOCAINE HCL (PF) 1 % IJ SOLN
INTRAMUSCULAR | Status: DC | PRN
  Administered 2018-06-23: 2 mL

## 2018-06-23 MED ORDER — ACETAMINOPHEN 325 MG PO TABS: 650.0000 mg | ORAL_TABLET | ORAL | Status: DC | PRN

## 2018-06-23 MED ORDER — HEPARIN SODIUM (PORCINE) 1000 UNIT/ML IJ SOLN
INTRAMUSCULAR | Status: DC | PRN
  Administered 2018-06-23: 5000 [IU] via INTRAVENOUS

## 2018-06-23 MED ORDER — FENTANYL CITRATE (PF) 100 MCG/2ML IJ SOLN
INTRAMUSCULAR | Status: AC
  Filled 2018-06-23: qty 2

## 2018-06-23 MED ORDER — ONDANSETRON HCL 4 MG/2ML IJ SOLN: 4.0000 mg | Freq: Four times a day (QID) | INTRAMUSCULAR | Status: DC | PRN

## 2018-06-23 MED ORDER — SODIUM CHLORIDE 0.9% FLUSH
3.0000 mL | Freq: Two times a day (BID) | INTRAVENOUS | Status: DC
  Administered 2018-06-24: 3 mL via INTRAVENOUS

## 2018-06-23 MED ORDER — HEPARIN (PORCINE) IN NACL 2-0.9 UNITS/ML
INTRAMUSCULAR | Status: DC | PRN
  Administered 2018-06-23: 10 mL via INTRA_ARTERIAL

## 2018-06-23 MED ORDER — MIDAZOLAM HCL 2 MG/2ML IJ SOLN
INTRAMUSCULAR | Status: AC
  Filled 2018-06-23: qty 2

## 2018-06-23 MED ORDER — HEPARIN BOLUS VIA INFUSION
4000.0000 [IU] | Freq: Once | INTRAVENOUS | Status: AC
  Administered 2018-06-23: 4000 [IU] via INTRAVENOUS
  Filled 2018-06-23: qty 4000

## 2018-06-23 MED ORDER — HEPARIN SODIUM (PORCINE) 1000 UNIT/ML IJ SOLN
INTRAMUSCULAR | Status: AC
  Filled 2018-06-23: qty 1

## 2018-06-23 MED ORDER — FENTANYL CITRATE (PF) 100 MCG/2ML IJ SOLN
INTRAMUSCULAR | Status: DC | PRN
  Administered 2018-06-23: 25 ug via INTRAVENOUS

## 2018-06-23 MED ORDER — LIDOCAINE HCL (PF) 1 % IJ SOLN
INTRAMUSCULAR | Status: AC
  Filled 2018-06-23: qty 30

## 2018-06-23 SURGICAL SUPPLY — 11 items
CATH 5FR JL3.5 JR4 ANG PIG MP (CATHETERS) ×1 IMPLANT
COVER PRB 48X5XTLSCP FOLD TPE (BAG) IMPLANT
COVER PROBE 5X48 (BAG) ×3
DEVICE RAD COMP TR BAND LRG (VASCULAR PRODUCTS) ×1 IMPLANT
GLIDESHEATH SLEND SS 6F .021 (SHEATH) ×1 IMPLANT
GUIDEWIRE INQWIRE 1.5J.035X260 (WIRE) IMPLANT
INQWIRE 1.5J .035X260CM (WIRE) ×3
KIT HEART LEFT (KITS) ×3 IMPLANT
PACK CARDIAC CATHETERIZATION (CUSTOM PROCEDURE TRAY) ×3 IMPLANT
TRANSDUCER W/STOPCOCK (MISCELLANEOUS) ×3 IMPLANT
TUBING CIL FLEX 10 FLL-RA (TUBING) ×3 IMPLANT

## 2018-06-23 NOTE — Care Management Obs Status (Signed)
Onward NOTIFICATION   Patient Details  Name: Sophia [name] MRN: [id number] Date of Birth: 03-08-1951   Medicare Observation Status Notification Given:  Yes    Camil Wilhelmsen, Chauncey Reading, RN 06/23/2018, 8:53 AM

## 2018-06-23 NOTE — Interval H&P Note (Signed)
Cath Lab Visit (complete for each Cath Lab visit)  Clinical Evaluation Leading to the Procedure:   ACS: Yes.    Non-ACS:    Anginal Classification: CCS IV  Anti-ischemic medical therapy: Minimal Therapy (1 class of medications)  Non-Invasive Test Results: No non-invasive testing performed  Prior CABG: No previous CABG      History and Physical Interval Note:  06/23/2018 5:25 PM  Sophia [name]  has presented today for surgery, with the diagnosis of unstable angina  The various methods of treatment have been discussed with the patient and family. After consideration of risks, benefits and other options for treatment, the patient has consented to  Procedure(s): LEFT HEART CATH AND CORONARY ANGIOGRAPHY (N/A) as a surgical intervention .  The patient's history has been reviewed, patient examined, no change in status, stable for surgery.  I have reviewed the patient's chart and labs.  Questions were answered to the patient's satisfaction.     Larae Grooms

## 2018-06-23 NOTE — Plan of Care (Signed)
Patient educated on heart catherization process and procedure via Cardiology PA.  Patient and family verbalized understanding.

## 2018-06-23 NOTE — Evaluation (Signed)
Physical Therapy Evaluation Patient Details Name: Sophia [name] MRN: [id number] DOB: 1951-02-27 Today's Date: 06/23/2018   History of Present Illness  Patient is a 67 year old female admitted 06/22/2018 with c/o chest pain. PMH: depression, anxiety, HTN, DM II.   Clinical Impression  Patient overall performed well but required external support while ambulating for safety due to c/o chest tightness and gait deviations. Patient would continue to benefit from skilled physical therapy in current environment and next venue to continue return to prior function and increase strength, endurance, balance, coordination, and functional mobility and gait skills.      Follow Up Recommendations Home health PT    Equipment Recommendations  Rolling walker with 5" wheels (vs SPC)   Recommendations for Other Services       Precautions / Restrictions Precautions Precautions: Fall Restrictions Weight Bearing Restrictions: No      Mobility  Bed Mobility Overal bed mobility: Independent                Transfers Overall transfer level: Needs assistance Equipment used: None Transfers: Sit to/from Stand;Stand Pivot Transfers Sit to Stand: Supervision Stand pivot transfers: Supervision          Ambulation/Gait Ambulation/Gait assistance: Min guard Gait Distance (Feet): 50 Feet Assistive device: None Gait Pattern/deviations: Step-through pattern;Decreased stride length Gait velocity: decr   General Gait Details: had tendency to reach for hall railing and c/o worsening tightening of chest as ambulated further. When monitors resumed in room, O2 sates were 94% and HR 67 bpm  Stairs            Wheelchair Mobility    Modified Rankin (Stroke Patients Only)       Balance Overall balance assessment: Mild deficits observed, not formally tested                                           Pertinent Vitals/Pain Pain Assessment: No/denies pain    Home Living  Family/patient expects to be discharged to:: Private residence Living Arrangements: Spouse/significant other;Parent Available Help at Discharge: Family Type of Home: House Home Access: Ramped entrance     Home Layout: One level Home Equipment: Hand held shower head;Grab bars - tub/shower;Grab bars - toilet;Shower seat Additional Comments: her father lives in the home with family; his he is a wheelchair and requires assistance but has assistants that come to the home to help him.     Prior Function Level of Independence: Independent               Hand Dominance        Extremity/Trunk Assessment   Upper Extremity Assessment Upper Extremity Assessment: Overall WFL for tasks assessed    Lower Extremity Assessment Lower Extremity Assessment: Overall WFL for tasks assessed    Cervical / Trunk Assessment Cervical / Trunk Assessment: Normal  Communication   Communication: No difficulties  Cognition Arousal/Alertness: Awake/alert Behavior During Therapy: WFL for tasks assessed/performed Overall Cognitive Status: Within Functional Limits for tasks assessed                                        General Comments      Exercises     Assessment/Plan    PT Assessment Patient needs continued PT services  PT Problem List Decreased strength;Decreased  activity tolerance;Decreased knowledge of use of DME;Decreased balance;Decreased mobility       PT Treatment Interventions DME instruction;Therapeutic activities;Gait training;Therapeutic exercise;Patient/family education;Functional mobility training;Neuromuscular re-education;Balance training    PT Goals (Current goals can be found in the Care Plan section)  Acute Rehab PT Goals Patient Stated Goal: find what is wrong with my heart when I walk. PT Goal Formulation: With patient/family Time For Goal Achievement: 07/07/18 Potential to Achieve Goals: Fair    Frequency Min 3X/week   Barriers to discharge         Co-evaluation               AM-PAC PT "6 Clicks" Daily Activity  Outcome Measure Difficulty turning over in bed (including adjusting bedclothes, sheets and blankets)?: None Difficulty moving from lying on back to sitting on the side of the bed? : A Little Difficulty sitting down on and standing up from a chair with arms (e.g., wheelchair, bedside commode, etc,.)?: A Little Help needed moving to and from a bed to chair (including a wheelchair)?: A Little Help needed walking in hospital room?: A Little Help needed climbing 3-5 steps with a railing? : A Little 6 Click Score: 19    End of Session Equipment Utilized During Treatment: Gait belt Activity Tolerance: Patient tolerated treatment well;Patient limited by fatigue Patient left: in chair;with family/visitor present;with call bell/phone within reach Nurse Communication: Mobility status PT Visit Diagnosis: Unsteadiness on feet (R26.81);Other abnormalities of gait and mobility (R26.89);Difficulty in walking, not elsewhere classified (R26.2)    Time: 4081-4481 PT Time Calculation (min) (ACUTE ONLY): 31 min   Charges:   PT Evaluation $PT Eval Low Complexity: 1 Low PT Treatments $Gait Training: 8-22 mins        Floria Raveling. Hartnett-Rands, MS, PT Per [place] #[id number] 06/23/2018, 12:03 PM

## 2018-06-23 NOTE — Plan of Care (Signed)
  Problem: Acute Rehab PT Goals(only PT should resolve) Goal: Patient Will Transfer Sit To/From Stand Outcome: Progressing Goal: Pt Will Transfer Bed To Chair/Chair To Bed Outcome: Progressing Goal: Pt Will Ambulate Outcome: Progressing Goal: Pt/caregiver will Perform Home Exercise Program Outcome: [place] D. Hartnett-Rands, MS, PT Per [place] [phone]

## 2018-06-23 NOTE — Progress Notes (Signed)
ANTICOAGULATION CONSULT NOTE - Initial Consult  Pharmacy Consult for heparin Indication: chest pain/ACS  No Known Allergies  Patient Measurements: Height: 5\' 9"  (175.3 cm) Weight: 216 lb 0.8 oz (98 kg) IBW/kg (Calculated) : 66.2 Heparin Dosing Weight: 87.3 kg  Vital Signs: Temp: 98 F (36.7 C) (08/06 0800) Temp Source: Oral (08/06 0800) BP: 121/57 (08/06 0800) Pulse Rate: 59 (08/06 0200)  Labs: Recent Labs    06/22/18 1638 06/22/18 2222 06/23/18 0352 06/23/18 0948  HGB 12.2  --  11.0*  --   HCT 38.5  --  35.4*  --   PLT 243  --  224  --   CREATININE 0.95  --  0.79  --   TROPONINI <0.03 <0.03 <0.03 <0.03    Estimated Creatinine Clearance: 83.8 mL/min (by C-G formula based on SCr of 0.79 mg/dL).   Medical History: Past Medical History:  Diagnosis Date  . Anxiety   . Arthritis   . Depression   . Diabetes mellitus without complication ([place])   . GERD (gastroesophageal reflux disease)   . Hypercholesteremia   . Hypertension   . PONV (postoperative nausea and vomiting)     Medications:  Medications Prior to Admission  Medication Sig Dispense Refill Last Dose  . acetaminophen (TYLENOL) 325 MG tablet Take 325 mg by mouth every 6 (six) hours as needed for moderate pain.   06/21/2018 at Unknown time  . ALPRAZolam (XANAX) 1 MG tablet One tablet by mouth for extreme anxiety or panic attack (Patient taking differently: Take 1 mg by mouth daily as needed for anxiety or sleep. One tablet by mouth for extreme anxiety or panic attack) 10 tablet 0 Past Week at Unknown time  . amLODipine (NORVASC) 10 MG tablet Take 1 tablet (10 mg total) by mouth daily. 90 tablet 3 06/22/2018 at Unknown time  . aspirin EC 81 MG tablet Take 81 mg by mouth daily.   06/22/2018 at Unknown time  . benazepril-hydrochlorthiazide (LOTENSIN HCT) 20-12.5 MG tablet Take 2 tablets by mouth daily. 180 tablet 1 06/22/2018 at Unknown time  . Cholecalciferol (VITAMIN D3 PO) Take 2,000 Units by mouth daily.   06/22/2018  at Unknown time  . Cyanocobalamin (VITAMIN B-12 PO) Take 1,000 mcg by mouth daily.   06/22/2018 at Unknown time  . diltiazem (CARDIZEM CD) 240 MG 24 hr capsule Take 1 capsule (240 mg total) by mouth daily. 90 capsule 1 06/22/2018 at Unknown time  . FLUoxetine (PROZAC) 40 MG capsule TAKE 1 CAPSULE EVERY DAY 90 capsule 1 06/22/2018 at Unknown time  . gabapentin (NEURONTIN) 100 MG capsule Take 100 mg by mouth 3 (three) times daily.   06/22/2018 at Unknown time  . loratadine (CLARITIN) 10 MG tablet Take 1 tablet (10 mg total) by mouth daily as needed for allergies. 90 tablet 1 Past Week at Unknown time  . meclizine (ANTIVERT) 25 MG tablet Take 25 mg by mouth 3 (three) times daily as needed for dizziness.   unknown  . Multiple Vitamin (MULTIVITAMIN WITH MINERALS) TABS tablet Take 1 tablet by mouth daily.   Past Week at Unknown time  . Omega-3 Fatty Acids (FISH OIL) 1000 MG CAPS Take 1 capsule by mouth daily.   06/22/2018 at Unknown time  . pantoprazole (PROTONIX) 40 MG tablet TAKE 1 TABLET EVERY DAY 90 tablet 1 06/22/2018 at Unknown time  . pravastatin (PRAVACHOL) 40 MG tablet TAKE 1 TABLET EVERY EVENING 90 tablet 1 06/21/2018 at Unknown time  . temazepam (RESTORIL) 15 MG capsule TAKE 1 CAPSULE  AT BEDTIME AS NEEDED FOR SLEEP 90 capsule 1 06/21/2018 at Unknown time  . UNABLE TO FIND Take 5 mLs by mouth daily as needed (for energy). Black seed black/cumin seed oil. 1 tsp daily   06/21/2018 at Unknown time    Assessment: Pharmacy consulted to dose heparin in a patient with ACS with patient showing s/s of unstable angina. Patient has had progressive chest pain, dyspnea on exertion, and fatigue. EKG and enzymes negative with no acute ST changes. Patient will be transferred to [facility] for cath/cardiology service.  Goal of Therapy:  Heparin level 0.3-0.7 units/ml Monitor platelets by anticoagulation protocol: Yes   Plan:  Give 4000 units bolus x 1 Start heparin infusion at 1200 units/hr Check anti-Xa level in 6-8  hours and daily while on heparin Continue to monitor H&H and platelets  Ramond Craver 06/23/2018,11:02 AM

## 2018-06-23 NOTE — Progress Notes (Signed)
Received pt [facility] via [facility] alert and oriented X4.  Skin warm and dry and denies and CP at this time. Pt placed on monitor, Consent signed and IVF infusing.

## 2018-06-23 NOTE — Consult Note (Addendum)
Cardiology Consult    Patient ID: Sophia [name]; [id number]; 08-09-1951   Admit date: 06/22/2018 Date of Consult: 06/23/2018  Primary Care Provider: Fayrene Helper, MD Primary Cardiologist: Sophia Rouge, MD   Patient Profile    Sophia [name] is a 67 y.o. female with past medical history of HTN, HLD, Type 2 DM (diet-controlled), and prior tobacco use who is being seen today for the evaluation of chest pain at the request of Sophia [name].   History of Present Illness    Sophia [name] was last evaluated by Sophia [name] 06/2017 as a consultation regarding frequent palpitations. She reported having frequent episodes with associated dizziness but upon being started on Meclizine for episodes of vertigo, her palpitations had improved. An echocardiogram was obtained and showed a preserved EF of 60% with no regional wall motion abnormalities, grade 1 diastolic dysfunction, and mild MR. Was informed to follow-up with Cardiology on an as-needed basis.  She was evaluated by her PCP on 06/22/2018 and reported episodes of chest pain and dyspnea on exertion over the past 10 days. Given her multiple cardiac risk factors and worsening symptoms, it was recommended she go to the ED for further evaluation.  She reports she is usually very active at baseline as she retired from working in housekeeping at the [employer] but was working [employer] until approximately 2 weeks ago when she developed symptoms which have limited her activity significantly. Reports 2 weeks ago she awoke from sleep with significant pain along her lower extremities, shortness of breath, and discomfort along her chest. EMS was called and she reported everything "checked out fine" and she declined to go to the ED. She was evaluated by her PCP and it was thought her symptoms were consistent with a panic attack. Since then, she has noted worsening fatigue, dyspnea on exertion, and chest discomfort with minimal activity. She is now unable to wash her dishes or  walk for more than 20 feet without developing chest discomfort and shortness of breath. Also reports significant fatigue. Symptoms typically resolve within minutes upon sitting down to rest. She denies any associated orthopnea, PND, lower extremity edema, or palpitations. Reports weight has been stable on her home scales.  Initial labs showed WBC 6.6, Hgb 12.2, platelets 243, Na+ 138, K+ 3.9, and creatinine 0.95.  D-dimer elevated to 0.80. Mg 2.0.  Initial and cyclic troponin values have been negative thus far.  CXR showed no active cardiopulmonary disease with aortic atherosclerosis and bibasilar atelectasis noted.  CTA showed no evidence of PE but the study was technically borderline. Was noted to have cardiomegaly, coronary atherosclerosis, and a very small pericardial effusion. EKG shows normal sinus rhythm, heart rate 68, with no acute ST changes when compared to prior tracings.   Past Medical History:  Diagnosis Date  . Anxiety   . Arthritis   . Depression   . Diabetes mellitus without complication ([place])   . GERD (gastroesophageal reflux disease)   . Hypercholesteremia   . Hypertension   . PONV (postoperative nausea and vomiting)     Past Surgical History:  Procedure Laterality Date  . ABDOMINAL HYSTERECTOMY  1982   tubes and womb, bleeding and ectopic  . ABDOMINAL SURGERY     removal of tumors  . BREAST SURGERY Left 1983   benign tumor  . CATARACT EXTRACTION W/PHACO Right 01/11/2014   Procedure: CATARACT EXTRACTION PHACO AND INTRAOCULAR LENS PLACEMENT (IOC);  Surgeon: Elta Guadeloupe T. Gershon Crane, MD;  Location: [facility];  Service: Ophthalmology;  Laterality: Right;  CDE 5.57  . CATARACT EXTRACTION W/PHACO Left 01/25/2014   Procedure: CATARACT EXTRACTION PHACO AND INTRAOCULAR LENS PLACEMENT (IOC);  Surgeon: Elta Guadeloupe T. Gershon Crane, MD;  Location: [facility];  Service: Ophthalmology;  Laterality: Left;  CDE:5.19  . COLONOSCOPY  2007 [facility]   NL EXAM  . COLONOSCOPY N/A 04/22/2014   Sophia [name]:Normal mucosa in  the terminal ileum/Two COLON polyps REMOVED/ Mild diverticulosis in the ascending colon and transverse colon/The LEFT colon IS redundant/Small internal hemorrhoids. Path: tubular adenoma. Next colonoscopy in 5-10 years  . ESOPHAGOGASTRODUODENOSCOPY N/A 04/22/2014   Sophia [name]:MICROCYTIC ANEMIA MOST LIKELY DUE TO ASA/VOLTAREN/Small hiatal hernia/MODERATE Non-erosive gastritis. Negative H.pylori  . 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 UPPER GASTROINTESTINAL ENDOSCOPY  2007 CHEST PAIN   NL EXAM  . YAG LASER APPLICATION Right 08/08/1940   Procedure: YAG LASER APPLICATION;  Surgeon: Elta Guadeloupe T. Gershon Crane, MD;  Location: [facility];  Service: Ophthalmology;  Laterality: Right;  . YAG LASER APPLICATION Left 7/40/8144   Procedure: YAG LASER APPLICATION;  Surgeon: Sophia Guys, MD;  Location: [facility];  Service: Ophthalmology;  Laterality: Left;     Home Medications:  Prior to Admission medications   Medication Sig Start Date End Date Taking? Authorizing Provider  acetaminophen (TYLENOL) 325 MG tablet Take 325 mg by mouth every 6 (six) hours as needed for moderate pain.   Yes [provider]  ALPRAZolam Duanne Moron) 1 MG tablet One tablet by mouth for extreme anxiety or panic attack Patient taking differently: Take 1 mg by mouth daily as needed for anxiety or sleep. One tablet by mouth for extreme anxiety or panic attack 06/08/18  Yes Sophia Helper, MD  amLODipine (NORVASC) 10 MG tablet Take 1 tablet (10 mg total) by mouth daily. 05/20/18  Yes Sophia Helper, MD  aspirin EC 81 MG tablet Take 81 mg by mouth daily.   Yes [provider]  benazepril-hydrochlorthiazide (LOTENSIN HCT) 20-12.5 MG tablet Take 2 tablets by mouth daily. 04/08/18  Yes Sophia Helper, MD  Cholecalciferol (VITAMIN D3 PO) Take 2,000 Units by mouth daily.   Yes [provider]  Cyanocobalamin (VITAMIN B-12 PO) Take 1,000 mcg by mouth daily.   Yes [provider]  diltiazem (CARDIZEM CD) 240 MG  24 hr capsule Take 1 capsule (240 mg total) by mouth daily. 04/08/18  Yes Sophia Helper, MD  FLUoxetine (PROZAC) 40 MG capsule TAKE 1 CAPSULE EVERY DAY 06/02/18  Yes Sophia Helper, MD  gabapentin (NEURONTIN) 100 MG capsule Take 100 mg by mouth 3 (three) times daily.   Yes [provider]  loratadine (CLARITIN) 10 MG tablet Take 1 tablet (10 mg total) by mouth daily as needed for allergies. 02/12/17  Yes Sophia Helper, MD  meclizine (ANTIVERT) 25 MG tablet Take 25 mg by mouth 3 (three) times daily as needed for dizziness.   Yes [provider]  Multiple Vitamin (MULTIVITAMIN WITH MINERALS) TABS tablet Take 1 tablet by mouth daily.   Yes [provider]  Omega-3 Fatty Acids (FISH OIL) 1000 MG CAPS Take 1 capsule by mouth daily.   Yes [provider]  pantoprazole (PROTONIX) 40 MG tablet TAKE 1 TABLET EVERY DAY 01/09/18  Yes Sophia Helper, MD  pravastatin (PRAVACHOL) 40 MG tablet TAKE 1 TABLET EVERY EVENING 01/09/18  Yes Sophia Helper, MD  temazepam (RESTORIL) 15 MG capsule TAKE 1 CAPSULE AT BEDTIME AS NEEDED FOR SLEEP 10/15/17  Yes Sophia Helper, MD  UNABLE  TO FIND Take 5 mLs by mouth daily as needed (for energy). Black seed black/cumin seed oil. 1 tsp daily   Yes [provider]    Inpatient Medications: Scheduled Meds: . amLODipine  10 mg Oral Daily  . aspirin EC  81 mg Oral Daily  . benazepril  20 mg Oral Daily  . diltiazem  240 mg Oral Daily  . FLUoxetine  40 mg Oral Daily  . gabapentin  100 mg Oral TID  . hydrochlorothiazide  12.5 mg Oral Daily  . multivitamin with minerals  1 tablet Oral Daily  . pantoprazole  40 mg Oral Daily  . pravastatin  40 mg Oral QPM   Continuous Infusions:  PRN Meds: acetaminophen, ALPRAZolam, loratadine, meclizine, ondansetron **OR** ondansetron (ZOFRAN) IV  Allergies:   No Known Allergies  Social History:   Social History   Socioeconomic History  . Marital status: Married     Spouse name: Not on file  . Number of children: Not on file  . Years of education: Not on file  . Highest education level: Not on file  Occupational History  . Not on file  Social Needs  . Financial resource strain: Not on file  . Food insecurity:    Worry: Not on file    Inability: Not on file  . Transportation needs:    Medical: Not on file    Non-medical: Not on file  Tobacco Use  . Smoking status: Former Smoker    Packs/day: 0.25    Years: 20.00    Pack years: 5.00    Types: Cigarettes    Last attempt to quit: 01/05/1990    Years since quitting: 28.4  . Smokeless tobacco: Former Systems developer    Types: Snuff    Quit date: 03/18/2018  Substance and Sexual Activity  . Alcohol use: No  . Drug use: No  . Sexual activity: Not Currently    Birth control/protection: Surgical  Lifestyle  . Physical activity:    Days per week: Not on file    Minutes per session: Not on file  . Stress: Not on file  Relationships  . Social connections:    Talks on phone: Not on file    Gets together: Not on file    Attends religious service: Not on file    Active member of club or organization: Not on file    Attends meetings of clubs or organizations: Not on file    Relationship status: Not on file  . Intimate partner violence:    Fear of current or ex partner: Not on file    Emotionally abused: Not on file    Physically abused: Not on file    Forced sexual activity: Not on file  Other Topics Concern  . Not on file  Social History Narrative  . Not on file     Family History:    Family History  Problem Relation Age of Onset  . Colon polyps Sister 76  . Diabetes Sister   . Hypertension Sister   . Kidney disease Sister   . Arthritis Father   . Cancer Father        prostate   . Hypertension Father   . Diabetes Sister   . Hypertension Sister   . CAD Paternal Grandmother   . Colon cancer Neg Hx       Review of Systems    General:  No chills, fever, night sweats or weight changes.  Positive for fatigue.  Cardiovascular:  No edema,  orthopnea, palpitations, paroxysmal nocturnal dyspnea. Positive for chest pain and dyspnea on exertion. Dermatological: No rash, lesions/masses Respiratory: No cough, dyspnea Urologic: No hematuria, dysuria Abdominal:   No nausea, vomiting, diarrhea, bright red blood per rectum, melena, or hematemesis Neurologic:  No visual changes, wkns, changes in mental status. All other systems reviewed and are otherwise negative except as noted above.  Physical Exam/Data    Vitals:   06/23/18 0000 06/23/18 0200 06/23/18 0400 06/23/18 0500  BP: (!) 120/57 122/61    Pulse: (!) 59 (!) 59    Resp: 13 16    Temp:   97.9 F (36.6 C)   TempSrc:   Oral   SpO2: 98% 93%    Weight:    216 lb 0.8 oz (98 kg)  Height:        Intake/Output Summary (Last 24 hours) at 06/23/2018 0906 Last data filed at 06/23/2018 0300 Gross per 24 hour  Intake 120 ml  Output -  Net 120 ml   Filed Weights   06/22/18 1629 06/22/18 2242 06/23/18 0500  Weight: 217 lb (98.4 kg) 216 lb 0.8 oz (98 kg) 216 lb 0.8 oz (98 kg)   Body mass index is 31.91 kg/m.   General: Pleasant, African American female appearing in NAD Psych: Normal affect. Neuro: Alert and oriented X 3. Moves all extremities spontaneously. HEENT: Normal  Neck: Supple without bruits or JVD. Lungs:  Resp regular and unlabored, CTA without wheezing or rales. Heart: RRR no s3, s4, or murmurs. Abdomen: Soft, non-tender, non-distended, BS + x 4.  Extremities: No clubbing, cyanosis or edema. DP/PT/Radials 2+ and equal bilaterally.   EKG:  The EKG was personally reviewed and demonstrates:  NSR, HR 68, with no acute ST changes when compared to prior tracings. Telemetry:  Telemetry was personally reviewed and demonstrates:  NSR with no significant arrhythmias.    Labs/Studies     Relevant CV Studies:  Echocardiogram: 06/2017 Study Conclusions  - Left ventricle: The cavity size was normal. Wall thickness  was   increased in a pattern of mild LVH. Systolic function was normal.   The estimated ejection fraction was 60%. Wall motion was normal;   there were no regional wall motion abnormalities. Doppler   parameters are consistent with abnormal left ventricular   relaxation (grade 1 diastolic dysfunction). Doppler parameters   are consistent with high ventricular filling pressure. - Mitral valve: Calcified annulus. There was mild regurgitation.  Laboratory Data:  Chemistry Recent Labs  Lab 06/22/18 1638 06/23/18 0352  NA 138 140  K 3.9 3.8  CL 99 103  CO2 31 31  GLUCOSE 110* 144*  BUN 22 19  CREATININE 0.95 0.79  CALCIUM 9.5 9.0  GFRNONAA >60 >60  GFRAA >60 >60  ANIONGAP 8 6    No results for input(s): PROT, ALBUMIN, AST, ALT, ALKPHOS, BILITOT in the last 168 hours. Hematology Recent Labs  Lab 06/22/18 1638 06/23/18 0352  WBC 6.6 5.9  RBC 4.74 4.33  HGB 12.2 11.0*  HCT 38.5 35.4*  MCV 81.2 81.8  MCH 25.7* 25.4*  MCHC 31.7 31.1  RDW 16.1* 16.1*  PLT 243 224   Cardiac Enzymes Recent Labs  Lab 06/22/18 1638 06/22/18 2222 06/23/18 0352  TROPONINI <0.03 <0.03 <0.03   No results for input(s): TROPIPOC in the last 168 hours.  BNP Recent Labs  Lab 06/22/18 1632  BNP 14.0    DDimer  Recent Labs  Lab 06/22/18 1638  DDIMER 0.80*    Radiology/Studies:  Dg Chest  2 View  Result Date: 06/22/2018 CLINICAL DATA:  Dyspnea and chest tightness for 6-7 days. EXAM: CHEST - 2 VIEW COMPARISON:  09/02/2014 FINDINGS: Heart is top-normal in size. Mild-to-moderate aortic atherosclerosis without aneurysmal dilatation is noted. There is minimal atelectasis at the lung bases. No overt pulmonary edema, effusion or pneumothorax. Stable thoracic spondylosis without acute osseous appearing abnormality. IMPRESSION: No active cardiopulmonary disease. Aortic atherosclerosis. Bibasilar atelectasis. Electronically Signed   By: Ashley Royalty M.D.   On: 06/22/2018 17:45   Ct Angio Chest Pe W  And/or Wo Contrast  Result Date: 06/22/2018 CLINICAL DATA:  67 year old female with acute chest pain and shortness of breath for 5 days. EXAM: CT ANGIOGRAPHY CHEST WITH CONTRAST TECHNIQUE: Multidetector CT imaging of the chest was performed using the standard protocol during bolus administration of intravenous contrast. Multiplanar CT image reconstructions and MIPs were obtained to evaluate the vascular anatomy. CONTRAST:  177mL ISOVUE-370 IOPAMIDOL (ISOVUE-370) INJECTION 76% COMPARISON:  None. FINDINGS: Cardiovascular: This study is technically borderline and respiratory motion artifact decreases sensitivity in the LOWER lungs. No pulmonary emboli are identified. Cardiomegaly and coronary artery atherosclerotic calcifications noted. Thoracic aortic atherosclerotic calcifications noted without aneurysm or definite dissection. A very small pericardial effusion is noted. Mediastinum/Nodes: No enlarged mediastinal, hilar, or axillary lymph nodes. Thyroid gland, trachea, and esophagus demonstrate no significant findings. Lungs/Pleura: No airspace disease, nodule, mass, consolidation, pleural effusion or pneumothorax. Upper Abdomen: No acute abnormality. Musculoskeletal: No acute or suspicious bony abnormalities. Review of the MIP images confirms the above findings. IMPRESSION: 1. No evidence of pulmonary emboli but study is technically borderline. 2. Cardiomegaly, coronary artery disease and very small pericardial effusion. 3.  Aortic Atherosclerosis (ICD10-I70.0). Electronically Signed   By: Margarette Canada M.D.   On: 06/22/2018 20:30     Assessment & Plan    1. Chest Pain Concerning for Unstable Angina - The patient reports having progressive exertional chest discomfort, dyspnea on exertion, and fatigue over the past 2 weeks. Was previously working as a Secretary/administrator at the [employer] without anginal symptoms but is now unable to walk more than 20 feet in her home without developing symptoms. Her discomfort and  dyspnea resolve with rest. Denies any pain at the time of this encounter.  - While she does not have a history of CAD, she does have multiple cardiac risk factors including HTN, HLD, Type II DM, prior tobacco use, and coronary calcifications by CT this admission. - Initial and cyclic troponin values have been negative thus far. EKG with no acute ST changes.  CTA showed no evidence of PE but the study was technically borderline. Was noted to have cardiomegaly, coronary atherosclerosis, and a very small pericardial effusion.  - Will review with Dr. Harl Bowie but given her high-pretest probability, high likelihood for breast attenuation on stress imaging given her body habitus, and concerning symptoms she would likely require a cardiac catheterization for definitive evaluation. Please keep NPO for now until stress testing verses a catheterization can be determined.   2. HTN - BP well controlled at 122/61 on most recent check. She is on Amlodipine 10 mg daily, Benazepril-HCTZ 20-12.5mg  BID, and Cardizem CD 240mg  daily. Will stop Cardizem CD as she should not be on dual CCB therapy with both Amlodipine and Cardizem. Continue to follow BP closely with these changes.   3. HLD - FLP shows total cholesterol 178, triglycerides 265, HDL 41, and LDL 84. Continue PTA Pravastatin 40 mg daily. Pending further ischemic evaluation this admission, may need to consider titration  to high-intensity statin therapy.  4. Type 2 DM - repeat HgbA1c pending. Had improved to 6.2 by lab work in 02/2018.  - Per admitting team.   For questions or updates, please contact [place] Please consult [email or web address] for contact info under Cardiology/STEMI.  Signed, Erma Heritage, PA-C 06/23/2018, 9:06 AM Pager: [phone]   Attending Note Patient seen and discussed with PA Ahmed Prima, I agree with her documentation above. 67 yo female history of DM2, HL, HTN, GERD admitted with chest pain. Reports 7 days of progression  exertional chest pain and tightness. Over those 7 days symptoms have progressed in frequency and onset with lesser levels of activity. Pain only occurs with exertion, resolves shortly at rest. Associated SOB.    BNP 14 K 3.9 Cr 0.95 WBC 6.6 Hgb 12.2 Plt 243 Mg 2 D dimer 0.8 TSH 1.62  Trop neg x 3 CXR no acute process CT PE no PE. Does have cardiomegaly, CAD and aortic atherosclersosis EKG NSR, no ischemic changes 06/2017 echo LVE 60%, no WMAs, grade I diastolic dysfunction  Patient with multiple CAD risk factors including DM2, HTN, HL, prior tobaco history, aortic atherosclerosis as well as CAD by CT PE scan. She gives a textbook description of unstable angina. Recurrent symptoms today just walking with PT down the hallway. EKG and enzymes negative, however given her risk factors and clear story high concern for obstructive CAD. Pretest probability high than what could be sucesfully decreased by noninvasive stress testing, we will plan for definitive invasive evaluation with cath. Medical therapy at this time time with ASA, hep gtt, benazepril, pravastatin. If cad confirmed can start low dose beta blocker and adjust statin dosing.  We will plan for transfer to [facility] for cath to cardiology service.  Carlyle Dolly MD

## 2018-06-23 NOTE — Progress Notes (Signed)
PROGRESS NOTE  Sophia [name] JZP:[id number] DOB: 1951-09-30 DOA: 06/22/2018 PCP: Fayrene Helper, MD  Brief History:  67 year old female with a history of diabetes mellitus, hypertension, hyperlipidemia, anxiety presenting with 1 week history of exertional chest discomfort with associated shortness of breath.  The patient states that her chest discomfort, described as a pressure in the midsternal region, is worsened with increasing activity and walking longer distances.  She states that the symptoms improve with rest.  She denies any dizziness or syncope.  There is no fevers, chills, nausea, vomiting, diarrhea.  The patient endorses compliance with all her medications.  She went to see her primary care provider on 06/22/2018 because of the above symptoms.  The patient was subsequently sent to the emergency department for further evaluation.   Assessment/Plan: Exertional chest discomfort/Angina -Cardiology consult -Troponins negative x3 -Personally reviewed EKG--sinus rhythm, no concerning ischemic changes -Personally reviewed chest x-ray--negative infiltrate or edema -Echocardiogram -CT angiogram chest--negative PE, small pericardial effusion -Continue aspirin  Diabetes mellitus type 2 -Diet controlled -Hemoglobin A1c -03/17/2018 hemoglobin A1C--6.2  Hyperlipidemia -Continue statin  Essential hypertension -Continue amlodipine, diltiazem and benazepril/HCTZ     Disposition Plan:   Home when cleared by cardiology  Family Communication:   Family at bedside  Consultants:  cardiology  Code Status:  FUL l  DVT Prophylaxis:  SCDs   Procedures: As Listed in Progress Note Above  Antibiotics: None    Subjective: Patient denies fevers, chills, headache, chest pain, dyspnea, nausea, vomiting, diarrhea, abdominal pain, dysuria, hematuria, hematochezia, and melena.   Objective: Vitals:   06/23/18 0000 06/23/18 0200 06/23/18 0400 06/23/18 0500  BP: (!) 120/57  122/61    Pulse: (!) 59 (!) 59    Resp: 13 16    Temp:   97.9 F (36.6 C)   TempSrc:   Oral   SpO2: 98% 93%    Weight:    98 kg (216 lb 0.8 oz)  Height:        Intake/Output Summary (Last 24 hours) at 06/23/2018 0726 Last data filed at 06/23/2018 0300 Gross per 24 hour  Intake 120 ml  Output -  Net 120 ml   Weight change:  Exam:   General:  Pt is alert, follows commands appropriately, not in acute distress  HEENT: No icterus, No thrush, No neck mass, [place]/AT  Cardiovascular: RRR, S1/S2, no rubs, no gallops  Respiratory: CTA bilaterally, no wheezing, no crackles, no rhonchi  Abdomen: Soft/+BS, non tender, non distended, no guarding  Extremities: No edema, No lymphangitis, No petechiae, No rashes, no synovitis   Data Reviewed: I have personally reviewed following labs and imaging studies Basic Metabolic Panel: Recent Labs  Lab 06/22/18 1638 06/23/18 0352  NA 138 140  K 3.9 3.8  CL 99 103  CO2 31 31  GLUCOSE 110* 144*  BUN 22 19  CREATININE 0.95 0.79  CALCIUM 9.5 9.0  MG 2.0  --    Liver Function Tests: No results for input(s): AST, ALT, ALKPHOS, BILITOT, PROT, ALBUMIN in the last 168 hours. No results for input(s): LIPASE, AMYLASE in the last 168 hours. No results for input(s): AMMONIA in the last 168 hours. Coagulation Profile: No results for input(s): INR, PROTIME in the last 168 hours. CBC: Recent Labs  Lab 06/22/18 1638 06/23/18 0352  WBC 6.6 5.9  HGB 12.2 11.0*  HCT 38.5 35.4*  MCV 81.2 81.8  PLT 243 224   Cardiac Enzymes: Recent Labs  Lab  06/22/18 1638 06/22/18 2222 06/23/18 0352  TROPONINI <0.03 <0.03 <0.03   BNP: Invalid input(s): POCBNP CBG: No results for input(s): GLUCAP in the last 168 hours. HbA1C: No results for input(s): HGBA1C in the last 72 hours. Urine analysis:    Component Value Date/Time   COLORURINE YELLOW 12/31/2006 0207   APPEARANCEUR CLEAR 12/31/2006 0207   LABSPEC 1.027 12/31/2006 0207   PHURINE 6.0 12/31/2006  0207   GLUCOSEU NEG mg/dL 12/31/2006 0207   BILIRUBINUR neg 03/25/2016 1136   KETONESUR NEG mg/dL 12/31/2006 0207   PROTEINUR 100 03/25/2016 1136   PROTEINUR NEG mg/dL 12/31/2006 0207   UROBILINOGEN 0.2 03/25/2016 1136   UROBILINOGEN 0.2 12/31/2006 0207   NITRITE neg 03/25/2016 1136   NITRITE NEG 12/31/2006 0207   LEUKOCYTESUR small (1+) (A) 03/25/2016 1136   Sepsis Labs: @LABRCNTIP (procalcitonin:4,lacticidven:4) ) Recent Results (from the past 240 hour(s))  MRSA PCR Screening     Status: None   Collection Time: 06/22/18 10:35 PM  Result Value Ref Range Status   MRSA by PCR NEGATIVE NEGATIVE Final    Comment:        The GeneXpert MRSA Assay (FDA approved for NASAL specimens only), is one component of a comprehensive MRSA colonization surveillance program. It is not intended to diagnose MRSA infection nor to guide or monitor treatment for MRSA infections. Performed at [facility], [address] [place], [place] [address]      Scheduled Meds: . amLODipine  10 mg Oral Daily  . aspirin EC  81 mg Oral Daily  . benazepril  20 mg Oral Daily  . diltiazem  240 mg Oral Daily  . FLUoxetine  40 mg Oral Daily  . gabapentin  100 mg Oral TID  . hydrochlorothiazide  12.5 mg Oral Daily  . multivitamin with minerals  1 tablet Oral Daily  . pantoprazole  40 mg Oral Daily  . pravastatin  40 mg Oral QPM   Continuous Infusions: . sodium chloride      Procedures/Studies: Dg Chest 2 View  Result Date: 06/22/2018 CLINICAL DATA:  Dyspnea and chest tightness for 6-7 days. EXAM: CHEST - 2 VIEW COMPARISON:  09/02/2014 FINDINGS: Heart is top-normal in size. Mild-to-moderate aortic atherosclerosis without aneurysmal dilatation is noted. There is minimal atelectasis at the lung bases. No overt pulmonary edema, effusion or pneumothorax. Stable thoracic spondylosis without acute osseous appearing abnormality. IMPRESSION: No active cardiopulmonary disease. Aortic atherosclerosis. Bibasilar  atelectasis. Electronically Signed   By: Ashley Royalty M.D.   On: 06/22/2018 17:45   Ct Angio Chest Pe W And/or Wo Contrast  Result Date: 06/22/2018 CLINICAL DATA:  67 year old female with acute chest pain and shortness of breath for 5 days. EXAM: CT ANGIOGRAPHY CHEST WITH CONTRAST TECHNIQUE: Multidetector CT imaging of the chest was performed using the standard protocol during bolus administration of intravenous contrast. Multiplanar CT image reconstructions and MIPs were obtained to evaluate the vascular anatomy. CONTRAST:  170mL ISOVUE-370 IOPAMIDOL (ISOVUE-370) INJECTION 76% COMPARISON:  None. FINDINGS: Cardiovascular: This study is technically borderline and respiratory motion artifact decreases sensitivity in the LOWER lungs. No pulmonary emboli are identified. Cardiomegaly and coronary artery atherosclerotic calcifications noted. Thoracic aortic atherosclerotic calcifications noted without aneurysm or definite dissection. A very small pericardial effusion is noted. Mediastinum/Nodes: No enlarged mediastinal, hilar, or axillary lymph nodes. Thyroid gland, trachea, and esophagus demonstrate no significant findings. Lungs/Pleura: No airspace disease, nodule, mass, consolidation, pleural effusion or pneumothorax. Upper Abdomen: No acute abnormality. Musculoskeletal: No acute or suspicious bony abnormalities. Review of the MIP images  confirms the above findings. IMPRESSION: 1. No evidence of pulmonary emboli but study is technically borderline. 2. Cardiomegaly, coronary artery disease and very small pericardial effusion. 3.  Aortic Atherosclerosis (ICD10-I70.0). Electronically Signed   By: Margarette Canada M.D.   On: 06/22/2018 20:30    Orson Eva, DO  [facility] Pager [phone]  If 7PM-7AM, please contact night-coverage [email or web address] Password [email or web address] 06/23/2018, 7:26 AM   LOS: 0 days

## 2018-06-23 NOTE — H&P (View-Only) (Signed)
Cardiology Consult    Patient ID: Sophia [name]; [id number]; Jun 11, 1951   Admit date: 06/22/2018 Date of Consult: 06/23/2018  Primary Care Provider: Fayrene Helper, MD Primary Cardiologist: Jenkins Rouge, MD   Patient Profile    Sophia [name] is a 67 y.o. female with past medical history of HTN, HLD, Type 2 DM (diet-controlled), and prior tobacco use who is being seen today for the evaluation of chest pain at the request of Dr. Carles Collet.   History of Present Illness    Sophia [name] was last evaluated by Dr. Johnsie Cancel 06/2017 as a consultation regarding frequent palpitations. She reported having frequent episodes with associated dizziness but upon being started on Meclizine for episodes of vertigo, her palpitations had improved. An echocardiogram was obtained and showed a preserved EF of 60% with no regional wall motion abnormalities, grade 1 diastolic dysfunction, and mild MR. Was informed to follow-up with Cardiology on an as-needed basis.  She was evaluated by her PCP on 06/22/2018 and reported episodes of chest pain and dyspnea on exertion over the past 10 days. Given her multiple cardiac risk factors and worsening symptoms, it was recommended she go to the ED for further evaluation.  She reports she is usually very active at baseline as she retired from working in housekeeping at the [employer] but was working [employer] until approximately 2 weeks ago when she developed symptoms which have limited her activity significantly. Reports 2 weeks ago she awoke from sleep with significant pain along her lower extremities, shortness of breath, and discomfort along her chest. EMS was called and she reported everything "checked out fine" and she declined to go to the ED. She was evaluated by her PCP and it was thought her symptoms were consistent with a panic attack. Since then, she has noted worsening fatigue, dyspnea on exertion, and chest discomfort with minimal activity. She is now unable to wash her dishes or  walk for more than 20 feet without developing chest discomfort and shortness of breath. Also reports significant fatigue. Symptoms typically resolve within minutes upon sitting down to rest. She denies any associated orthopnea, PND, lower extremity edema, or palpitations. Reports weight has been stable on her home scales.  Initial labs showed WBC 6.6, Hgb 12.2, platelets 243, Na+ 138, K+ 3.9, and creatinine 0.95.  D-dimer elevated to 0.80. Mg 2.0.  Initial and cyclic troponin values have been negative thus far.  CXR showed no active cardiopulmonary disease with aortic atherosclerosis and bibasilar atelectasis noted.  CTA showed no evidence of PE but the study was technically borderline. Was noted to have cardiomegaly, coronary atherosclerosis, and a very small pericardial effusion. EKG shows normal sinus rhythm, heart rate 68, with no acute ST changes when compared to prior tracings.   Past Medical History:  Diagnosis Date  . Anxiety   . Arthritis   . Depression   . Diabetes mellitus without complication ([place])   . GERD (gastroesophageal reflux disease)   . Hypercholesteremia   . Hypertension   . PONV (postoperative nausea and vomiting)     Past Surgical History:  Procedure Laterality Date  . ABDOMINAL HYSTERECTOMY  1982   tubes and womb, bleeding and ectopic  . ABDOMINAL SURGERY     removal of tumors  . BREAST SURGERY Left 1983   benign tumor  . CATARACT EXTRACTION W/PHACO Right 01/11/2014   Procedure: CATARACT EXTRACTION PHACO AND INTRAOCULAR LENS PLACEMENT (IOC);  Surgeon: Elta Guadeloupe T. Gershon Crane, MD;  Location: [facility];  Service: Ophthalmology;  Laterality: Right;  CDE 5.57  . CATARACT EXTRACTION W/PHACO Left 01/25/2014   Procedure: CATARACT EXTRACTION PHACO AND INTRAOCULAR LENS PLACEMENT (IOC);  Surgeon: Elta Guadeloupe T. Gershon Crane, MD;  Location: [facility];  Service: Ophthalmology;  Laterality: Left;  CDE:5.19  . COLONOSCOPY  2007 [facility]   NL EXAM  . COLONOSCOPY N/A 04/22/2014   Dr. Fields:Normal mucosa in  the terminal ileum/Two COLON polyps REMOVED/ Mild diverticulosis in the ascending colon and transverse colon/The LEFT colon IS redundant/Small internal hemorrhoids. Path: tubular adenoma. Next colonoscopy in 5-10 years  . ESOPHAGOGASTRODUODENOSCOPY N/A 04/22/2014   Dr. Fields:MICROCYTIC ANEMIA MOST LIKELY DUE TO ASA/VOLTAREN/Small hiatal hernia/MODERATE Non-erosive gastritis. Negative H.pylori  . 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 UPPER GASTROINTESTINAL ENDOSCOPY  2007 CHEST PAIN   NL EXAM  . YAG LASER APPLICATION Right 6/78/9381   Procedure: YAG LASER APPLICATION;  Surgeon: Elta Guadeloupe T. Gershon Crane, MD;  Location: [facility];  Service: Ophthalmology;  Laterality: Right;  . YAG LASER APPLICATION Left 0/17/5102   Procedure: YAG LASER APPLICATION;  Surgeon: Rutherford Guys, MD;  Location: [facility];  Service: Ophthalmology;  Laterality: Left;     Home Medications:  Prior to Admission medications   Medication Sig Start Date End Date Taking? Authorizing Provider  acetaminophen (TYLENOL) 325 MG tablet Take 325 mg by mouth every 6 (six) hours as needed for moderate pain.   Yes [provider]  ALPRAZolam Duanne Moron) 1 MG tablet One tablet by mouth for extreme anxiety or panic attack Patient taking differently: Take 1 mg by mouth daily as needed for anxiety or sleep. One tablet by mouth for extreme anxiety or panic attack 06/08/18  Yes Fayrene Helper, MD  amLODipine (NORVASC) 10 MG tablet Take 1 tablet (10 mg total) by mouth daily. 05/20/18  Yes Fayrene Helper, MD  aspirin EC 81 MG tablet Take 81 mg by mouth daily.   Yes [provider]  benazepril-hydrochlorthiazide (LOTENSIN HCT) 20-12.5 MG tablet Take 2 tablets by mouth daily. 04/08/18  Yes Fayrene Helper, MD  Cholecalciferol (VITAMIN D3 PO) Take 2,000 Units by mouth daily.   Yes [provider]  Cyanocobalamin (VITAMIN B-12 PO) Take 1,000 mcg by mouth daily.   Yes [provider]  diltiazem (CARDIZEM CD) 240 MG  24 hr capsule Take 1 capsule (240 mg total) by mouth daily. 04/08/18  Yes Fayrene Helper, MD  FLUoxetine (PROZAC) 40 MG capsule TAKE 1 CAPSULE EVERY DAY 06/02/18  Yes Fayrene Helper, MD  gabapentin (NEURONTIN) 100 MG capsule Take 100 mg by mouth 3 (three) times daily.   Yes [provider]  loratadine (CLARITIN) 10 MG tablet Take 1 tablet (10 mg total) by mouth daily as needed for allergies. 02/12/17  Yes Fayrene Helper, MD  meclizine (ANTIVERT) 25 MG tablet Take 25 mg by mouth 3 (three) times daily as needed for dizziness.   Yes [provider]  Multiple Vitamin (MULTIVITAMIN WITH MINERALS) TABS tablet Take 1 tablet by mouth daily.   Yes [provider]  Omega-3 Fatty Acids (FISH OIL) 1000 MG CAPS Take 1 capsule by mouth daily.   Yes [provider]  pantoprazole (PROTONIX) 40 MG tablet TAKE 1 TABLET EVERY DAY 01/09/18  Yes Fayrene Helper, MD  pravastatin (PRAVACHOL) 40 MG tablet TAKE 1 TABLET EVERY EVENING 01/09/18  Yes Fayrene Helper, MD  temazepam (RESTORIL) 15 MG capsule TAKE 1 CAPSULE AT BEDTIME AS NEEDED FOR SLEEP 10/15/17  Yes Fayrene Helper, MD  UNABLE  TO FIND Take 5 mLs by mouth daily as needed (for energy). Black seed black/cumin seed oil. 1 tsp daily   Yes [provider]    Inpatient Medications: Scheduled Meds: . amLODipine  10 mg Oral Daily  . aspirin EC  81 mg Oral Daily  . benazepril  20 mg Oral Daily  . diltiazem  240 mg Oral Daily  . FLUoxetine  40 mg Oral Daily  . gabapentin  100 mg Oral TID  . hydrochlorothiazide  12.5 mg Oral Daily  . multivitamin with minerals  1 tablet Oral Daily  . pantoprazole  40 mg Oral Daily  . pravastatin  40 mg Oral QPM   Continuous Infusions:  PRN Meds: acetaminophen, ALPRAZolam, loratadine, meclizine, ondansetron **OR** ondansetron (ZOFRAN) IV  Allergies:   No Known Allergies  Social History:   Social History   Socioeconomic History  . Marital status: Married     Spouse name: Not on file  . Number of children: Not on file  . Years of education: Not on file  . Highest education level: Not on file  Occupational History  . Not on file  Social Needs  . Financial resource strain: Not on file  . Food insecurity:    Worry: Not on file    Inability: Not on file  . Transportation needs:    Medical: Not on file    Non-medical: Not on file  Tobacco Use  . Smoking status: Former Smoker    Packs/day: 0.25    Years: 20.00    Pack years: 5.00    Types: Cigarettes    Last attempt to quit: 01/05/1990    Years since quitting: 28.4  . Smokeless tobacco: Former Systems developer    Types: Snuff    Quit date: 03/18/2018  Substance and Sexual Activity  . Alcohol use: No  . Drug use: No  . Sexual activity: Not Currently    Birth control/protection: Surgical  Lifestyle  . Physical activity:    Days per week: Not on file    Minutes per session: Not on file  . Stress: Not on file  Relationships  . Social connections:    Talks on phone: Not on file    Gets together: Not on file    Attends religious service: Not on file    Active member of club or organization: Not on file    Attends meetings of clubs or organizations: Not on file    Relationship status: Not on file  . Intimate partner violence:    Fear of current or ex partner: Not on file    Emotionally abused: Not on file    Physically abused: Not on file    Forced sexual activity: Not on file  Other Topics Concern  . Not on file  Social History Narrative  . Not on file     Family History:    Family History  Problem Relation Age of Onset  . Colon polyps Sister 69  . Diabetes Sister   . Hypertension Sister   . Kidney disease Sister   . Arthritis Father   . Cancer Father        prostate   . Hypertension Father   . Diabetes Sister   . Hypertension Sister   . CAD Paternal Grandmother   . Colon cancer Neg Hx       Review of Systems    General:  No chills, fever, night sweats or weight changes.  Positive for fatigue.  Cardiovascular:  No edema,  orthopnea, palpitations, paroxysmal nocturnal dyspnea. Positive for chest pain and dyspnea on exertion. Dermatological: No rash, lesions/masses Respiratory: No cough, dyspnea Urologic: No hematuria, dysuria Abdominal:   No nausea, vomiting, diarrhea, bright red blood per rectum, melena, or hematemesis Neurologic:  No visual changes, wkns, changes in mental status. All other systems reviewed and are otherwise negative except as noted above.  Physical Exam/Data    Vitals:   06/23/18 0000 06/23/18 0200 06/23/18 0400 06/23/18 0500  BP: (!) 120/57 122/61    Pulse: (!) 59 (!) 59    Resp: 13 16    Temp:   97.9 F (36.6 C)   TempSrc:   Oral   SpO2: 98% 93%    Weight:    216 lb 0.8 oz (98 kg)  Height:        Intake/Output Summary (Last 24 hours) at 06/23/2018 0906 Last data filed at 06/23/2018 0300 Gross per 24 hour  Intake 120 ml  Output -  Net 120 ml   Filed Weights   06/22/18 1629 06/22/18 2242 06/23/18 0500  Weight: 217 lb (98.4 kg) 216 lb 0.8 oz (98 kg) 216 lb 0.8 oz (98 kg)   Body mass index is 31.91 kg/m.   General: Pleasant, African American female appearing in NAD Psych: Normal affect. Neuro: Alert and oriented X 3. Moves all extremities spontaneously. HEENT: Normal  Neck: Supple without bruits or JVD. Lungs:  Resp regular and unlabored, CTA without wheezing or rales. Heart: RRR no s3, s4, or murmurs. Abdomen: Soft, non-tender, non-distended, BS + x 4.  Extremities: No clubbing, cyanosis or edema. DP/PT/Radials 2+ and equal bilaterally.   EKG:  The EKG was personally reviewed and demonstrates:  NSR, HR 68, with no acute ST changes when compared to prior tracings. Telemetry:  Telemetry was personally reviewed and demonstrates:  NSR with no significant arrhythmias.    Labs/Studies     Relevant CV Studies:  Echocardiogram: 06/2017 Study Conclusions  - Left ventricle: The cavity size was normal. Wall thickness  was   increased in a pattern of mild LVH. Systolic function was normal.   The estimated ejection fraction was 60%. Wall motion was normal;   there were no regional wall motion abnormalities. Doppler   parameters are consistent with abnormal left ventricular   relaxation (grade 1 diastolic dysfunction). Doppler parameters   are consistent with high ventricular filling pressure. - Mitral valve: Calcified annulus. There was mild regurgitation.  Laboratory Data:  Chemistry Recent Labs  Lab 06/22/18 1638 06/23/18 0352  NA 138 140  K 3.9 3.8  CL 99 103  CO2 31 31  GLUCOSE 110* 144*  BUN 22 19  CREATININE 0.95 0.79  CALCIUM 9.5 9.0  GFRNONAA >60 >60  GFRAA >60 >60  ANIONGAP 8 6    No results for input(s): PROT, ALBUMIN, AST, ALT, ALKPHOS, BILITOT in the last 168 hours. Hematology Recent Labs  Lab 06/22/18 1638 06/23/18 0352  WBC 6.6 5.9  RBC 4.74 4.33  HGB 12.2 11.0*  HCT 38.5 35.4*  MCV 81.2 81.8  MCH 25.7* 25.4*  MCHC 31.7 31.1  RDW 16.1* 16.1*  PLT 243 224   Cardiac Enzymes Recent Labs  Lab 06/22/18 1638 06/22/18 2222 06/23/18 0352  TROPONINI <0.03 <0.03 <0.03   No results for input(s): TROPIPOC in the last 168 hours.  BNP Recent Labs  Lab 06/22/18 1632  BNP 14.0    DDimer  Recent Labs  Lab 06/22/18 1638  DDIMER 0.80*    Radiology/Studies:  Dg Chest  2 View  Result Date: 06/22/2018 CLINICAL DATA:  Dyspnea and chest tightness for 6-7 days. EXAM: CHEST - 2 VIEW COMPARISON:  09/02/2014 FINDINGS: Heart is top-normal in size. Mild-to-moderate aortic atherosclerosis without aneurysmal dilatation is noted. There is minimal atelectasis at the lung bases. No overt pulmonary edema, effusion or pneumothorax. Stable thoracic spondylosis without acute osseous appearing abnormality. IMPRESSION: No active cardiopulmonary disease. Aortic atherosclerosis. Bibasilar atelectasis. Electronically Signed   By: Ashley Royalty M.D.   On: 06/22/2018 17:45   Ct Angio Chest Pe W  And/or Wo Contrast  Result Date: 06/22/2018 CLINICAL DATA:  67 year old female with acute chest pain and shortness of breath for 5 days. EXAM: CT ANGIOGRAPHY CHEST WITH CONTRAST TECHNIQUE: Multidetector CT imaging of the chest was performed using the standard protocol during bolus administration of intravenous contrast. Multiplanar CT image reconstructions and MIPs were obtained to evaluate the vascular anatomy. CONTRAST:  171mL ISOVUE-370 IOPAMIDOL (ISOVUE-370) INJECTION 76% COMPARISON:  None. FINDINGS: Cardiovascular: This study is technically borderline and respiratory motion artifact decreases sensitivity in the LOWER lungs. No pulmonary emboli are identified. Cardiomegaly and coronary artery atherosclerotic calcifications noted. Thoracic aortic atherosclerotic calcifications noted without aneurysm or definite dissection. A very small pericardial effusion is noted. Mediastinum/Nodes: No enlarged mediastinal, hilar, or axillary lymph nodes. Thyroid gland, trachea, and esophagus demonstrate no significant findings. Lungs/Pleura: No airspace disease, nodule, mass, consolidation, pleural effusion or pneumothorax. Upper Abdomen: No acute abnormality. Musculoskeletal: No acute or suspicious bony abnormalities. Review of the MIP images confirms the above findings. IMPRESSION: 1. No evidence of pulmonary emboli but study is technically borderline. 2. Cardiomegaly, coronary artery disease and very small pericardial effusion. 3.  Aortic Atherosclerosis (ICD10-I70.0). Electronically Signed   By: Margarette Canada M.D.   On: 06/22/2018 20:30     Assessment & Plan    1. Chest Pain Concerning for Unstable Angina - The patient reports having progressive exertional chest discomfort, dyspnea on exertion, and fatigue over the past 2 weeks. Was previously working as a Secretary/administrator at the [employer] without anginal symptoms but is now unable to walk more than 20 feet in her home without developing symptoms. Her discomfort and  dyspnea resolve with rest. Denies any pain at the time of this encounter.  - While she does not have a history of CAD, she does have multiple cardiac risk factors including HTN, HLD, Type II DM, prior tobacco use, and coronary calcifications by CT this admission. - Initial and cyclic troponin values have been negative thus far. EKG with no acute ST changes.  CTA showed no evidence of PE but the study was technically borderline. Was noted to have cardiomegaly, coronary atherosclerosis, and a very small pericardial effusion.  - Will review with Dr. Harl Bowie but given her high-pretest probability, high likelihood for breast attenuation on stress imaging given her body habitus, and concerning symptoms she would likely require a cardiac catheterization for definitive evaluation. Please keep NPO for now until stress testing verses a catheterization can be determined.   2. HTN - BP well controlled at 122/61 on most recent check. She is on Amlodipine 10 mg daily, Benazepril-HCTZ 20-12.5mg  BID, and Cardizem CD 240mg  daily. Will stop Cardizem CD as she should not be on dual CCB therapy with both Amlodipine and Cardizem. Continue to follow BP closely with these changes.   3. HLD - FLP shows total cholesterol 178, triglycerides 265, HDL 41, and LDL 84. Continue PTA Pravastatin 40 mg daily. Pending further ischemic evaluation this admission, may need to consider titration  to high-intensity statin therapy.  4. Type 2 DM - repeat HgbA1c pending. Had improved to 6.2 by lab work in 02/2018.  - Per admitting team.   For questions or updates, please contact [place] Please consult [email or web address] for contact info under Cardiology/STEMI.  Signed, Erma Heritage, PA-C 06/23/2018, 9:06 AM Pager: [phone]   Attending Note Patient seen and discussed with PA Ahmed Prima, I agree with her documentation above. 67 yo female history of DM2, HL, HTN, GERD admitted with chest pain. Reports 7 days of progression  exertional chest pain and tightness. Over those 7 days symptoms have progressed in frequency and onset with lesser levels of activity. Pain only occurs with exertion, resolves shortly at rest. Associated SOB.    BNP 14 K 3.9 Cr 0.95 WBC 6.6 Hgb 12.2 Plt 243 Mg 2 D dimer 0.8 TSH 1.62  Trop neg x 3 CXR no acute process CT PE no PE. Does have cardiomegaly, CAD and aortic atherosclersosis EKG NSR, no ischemic changes 06/2017 echo LVE 60%, no WMAs, grade I diastolic dysfunction  Patient with multiple CAD risk factors including DM2, HTN, HL, prior tobaco history, aortic atherosclerosis as well as CAD by CT PE scan. She gives a textbook description of unstable angina. Recurrent symptoms today just walking with PT down the hallway. EKG and enzymes negative, however given her risk factors and clear story high concern for obstructive CAD. Pretest probability high than what could be sucesfully decreased by noninvasive stress testing, we will plan for definitive invasive evaluation with cath. Medical therapy at this time time with ASA, hep gtt, benazepril, pravastatin. If cad confirmed can start low dose beta blocker and adjust statin dosing.  We will plan for transfer to [facility] for cath to cardiology service.  Carlyle Dolly MD

## 2018-06-23 NOTE — Progress Notes (Signed)
*  PRELIMINARY RESULTS* Echocardiogram 2D Echocardiogram has been performed.  Sophia [name] 06/23/2018, 2:11 PM

## 2018-06-24 ENCOUNTER — Encounter (HOSPITAL_COMMUNITY): Payer: Self-pay | Admitting: Interventional Cardiology

## 2018-06-24 DIAGNOSIS — I10 Essential (primary) hypertension: Secondary | ICD-10-CM | POA: Diagnosis not present

## 2018-06-24 DIAGNOSIS — R072 Precordial pain: Secondary | ICD-10-CM | POA: Diagnosis not present

## 2018-06-24 DIAGNOSIS — E669 Obesity, unspecified: Secondary | ICD-10-CM

## 2018-06-24 DIAGNOSIS — E1169 Type 2 diabetes mellitus with other specified complication: Secondary | ICD-10-CM

## 2018-06-24 LAB — CBC
HCT: 36.1 % (ref 36.0–46.0)
Hemoglobin: 10.9 g/dL — ABNORMAL LOW (ref 12.0–15.0)
MCH: 24.8 pg — ABNORMAL LOW (ref 26.0–34.0)
MCHC: 30.2 g/dL (ref 30.0–36.0)
MCV: 82 fL (ref 78.0–100.0)
Platelets: 231 10*3/uL (ref 150–400)
RBC: 4.4 MIL/uL (ref 3.87–5.11)
RDW: 15.9 % — AB (ref 11.5–15.5)
WBC: 5.9 10*3/uL (ref 4.0–10.5)

## 2018-06-24 LAB — GLUCOSE, CAPILLARY
GLUCOSE-CAPILLARY: 109 mg/dL — AB (ref 70–99)
Glucose-Capillary: 132 mg/dL — ABNORMAL HIGH (ref 70–99)

## 2018-06-24 LAB — IRON AND TIBC
IRON: 77 ug/dL (ref 28–170)
Saturation Ratios: 26 % (ref 10.4–31.8)
TIBC: 295 ug/dL (ref 250–450)
UIBC: 218 ug/dL

## 2018-06-24 LAB — HEPARIN LEVEL (UNFRACTIONATED): Heparin Unfractionated: <0.1 IU/mL — ABNORMAL LOW (ref 0.30–0.70)

## 2018-06-24 LAB — FERRITIN: Ferritin: 181 ng/mL (ref 11–307)

## 2018-06-24 NOTE — Progress Notes (Signed)
Progress Note  Patient Name: Sophia [name] Date of Encounter: 06/24/2018  Primary Cardiologist: Dr. Jenkins Rouge, MD   Subjective   Pt doing well. Denies chest pain or palpitations. Cath site unremarkable   Inpatient Medications    Scheduled Meds: . amLODipine  10 mg Oral Daily  . aspirin EC  81 mg Oral Daily  . benazepril  20 mg Oral Daily  . FLUoxetine  40 mg Oral Daily  . gabapentin  100 mg Oral TID  . hydrochlorothiazide  12.5 mg Oral Daily  . multivitamin with minerals  1 tablet Oral Daily  . pantoprazole  40 mg Oral Daily  . pravastatin  40 mg Oral QPM  . sodium chloride flush  3 mL Intravenous Q12H   Continuous Infusions: . sodium chloride     PRN Meds: sodium chloride, acetaminophen, ALPRAZolam, loratadine, meclizine, ondansetron **OR** ondansetron (ZOFRAN) IV, sodium chloride flush   Vital Signs    Vitals:   06/23/18 1900 06/23/18 1930 06/24/18 0412 06/24/18 0755  BP: (!) 147/58 117/66 (!) 119/44 119/78  Pulse: 65 62 66 74  Resp: 13 13 14 14   Temp:   98 F (36.7 C) 97.9 F (36.6 C)  TempSrc:  Oral Oral Oral  SpO2: 98% 98% 92% 97%  Weight:   217 lb 9.5 oz (98.7 kg)   Height:        Intake/Output Summary (Last 24 hours) at 06/24/2018 1116 Last data filed at 06/24/2018 0700 Gross per 24 hour  Intake 1115.8 ml  Output -  Net 1115.8 ml   Filed Weights   06/22/18 2242 06/23/18 0500 06/24/18 0412  Weight: 216 lb 0.8 oz (98 kg) 216 lb 0.8 oz (98 kg) 217 lb 9.5 oz (98.7 kg)   Physical Exam   General: Well developed, well nourished, NAD Skin: Warm, dry, intact  Head: Normocephalic, atraumatic, clear, moist mucus membranes. Neck: Negative for carotid bruits. No JVD Lungs:Clear to ausculation bilaterally. No wheezes, rales, or rhonchi. Breathing is unlabored. Cardiovascular: RRR with S1 S2. No murmurs, rubs or gallops Abdomen: Soft, non-tender, non-distended with normoactive bowel sounds. No obvious abdominal masses. MSK: Strength and tone appear  normal for age. 5/5 in all extremities Extremities: No edema. No clubbing or cyanosis. DP/PT pulses 2+ bilaterally Neuro: Alert and oriented. No focal deficits. No facial asymmetry. MAE spontaneously. Psych: Responds to questions appropriately with normal affect.    Labs    Chemistry Recent Labs  Lab 06/22/18 1638 06/23/18 0352  NA 138 140  K 3.9 3.8  CL 99 103  CO2 31 31  GLUCOSE 110* 144*  BUN 22 19  CREATININE 0.95 0.79  CALCIUM 9.5 9.0  GFRNONAA >60 >60  GFRAA >60 >60  ANIONGAP 8 6     Hematology Recent Labs  Lab 06/22/18 1638 06/23/18 0352 06/24/18 0339  WBC 6.6 5.9 5.9  RBC 4.74 4.33 4.40  HGB 12.2 11.0* 10.9*  HCT 38.5 35.4* 36.1  MCV 81.2 81.8 82.0  MCH 25.7* 25.4* 24.8*  MCHC 31.7 31.1 30.2  RDW 16.1* 16.1* 15.9*  PLT 243 224 231    Cardiac Enzymes Recent Labs  Lab 06/22/18 1638 06/22/18 2222 06/23/18 0352 06/23/18 0948  TROPONINI <0.03 <0.03 <0.03 <0.03   No results for input(s): TROPIPOC in the last 168 hours.   BNP Recent Labs  Lab 06/22/18 1632  BNP 14.0     DDimer  Recent Labs  Lab 06/22/18 1638  DDIMER 0.80*    Radiology    Dg  Chest 2 View  Result Date: 06/22/2018 CLINICAL DATA:  Dyspnea and chest tightness for 6-7 days. EXAM: CHEST - 2 VIEW COMPARISON:  09/02/2014 FINDINGS: Heart is top-normal in size. Mild-to-moderate aortic atherosclerosis without aneurysmal dilatation is noted. There is minimal atelectasis at the lung bases. No overt pulmonary edema, effusion or pneumothorax. Stable thoracic spondylosis without acute osseous appearing abnormality. IMPRESSION: No active cardiopulmonary disease. Aortic atherosclerosis. Bibasilar atelectasis. Electronically Signed   By: Ashley Royalty M.D.   On: 06/22/2018 17:45   Ct Angio Chest Pe W And/or Wo Contrast  Result Date: 06/22/2018 CLINICAL DATA:  67 year old female with acute chest pain and shortness of breath for 5 days. EXAM: CT ANGIOGRAPHY CHEST WITH CONTRAST TECHNIQUE: Multidetector  CT imaging of the chest was performed using the standard protocol during bolus administration of intravenous contrast. Multiplanar CT image reconstructions and MIPs were obtained to evaluate the vascular anatomy. CONTRAST:  149mL ISOVUE-370 IOPAMIDOL (ISOVUE-370) INJECTION 76% COMPARISON:  None. FINDINGS: Cardiovascular: This study is technically borderline and respiratory motion artifact decreases sensitivity in the LOWER lungs. No pulmonary emboli are identified. Cardiomegaly and coronary artery atherosclerotic calcifications noted. Thoracic aortic atherosclerotic calcifications noted without aneurysm or definite dissection. A very small pericardial effusion is noted. Mediastinum/Nodes: No enlarged mediastinal, hilar, or axillary lymph nodes. Thyroid gland, trachea, and esophagus demonstrate no significant findings. Lungs/Pleura: No airspace disease, nodule, mass, consolidation, pleural effusion or pneumothorax. Upper Abdomen: No acute abnormality. Musculoskeletal: No acute or suspicious bony abnormalities. Review of the MIP images confirms the above findings. IMPRESSION: 1. No evidence of pulmonary emboli but study is technically borderline. 2. Cardiomegaly, coronary artery disease and very small pericardial effusion. 3.  Aortic Atherosclerosis (ICD10-I70.0). Electronically Signed   By: Margarette Canada M.D.   On: 06/22/2018 20:30   Telemetry    06/24/18 NSR  - Personally Reviewed  ECG    No new tracing as of 06/24/18- Personally Reviewed  Cardiac Studies   Cardiac catheterization 06/23/18:  The left ventricular systolic function is normal.  LV end diastolic pressure is normal.  The left ventricular ejection fraction is 55-65% by visual estimate.  There is no aortic valve stenosis.  No significant CAD. Tortuous vessels.    No indication for antiplatelet therapy at this time.   Echocardiogram 06/23/18: Study Conclusions  - Left ventricle: The cavity size was normal. Wall thickness was    increased in a pattern of moderate LVH. Systolic function was   vigorous. The estimated ejection fraction was in the range of 65%   to 70%. Wall motion was normal; there were no regional wall   motion abnormalities. Doppler parameters are consistent with   abnormal left ventricular relaxation (grade 1 diastolic   dysfunction). - Aortic valve: Mildly calcified annulus. Trileaflet; mildly   thickened leaflets. Valve area (VTI): 4.2 cm^2. Valve area   (Vmax): 2.92 cm^2. - Mitral valve: Mildly calcified annulus. Normal thickness leaflets   . - Technically adequate study.  Patient Profile     67 y.o. female with past medical history of HTN, HLD, Type 2 DM (diet-controlled), and prior tobacco use who is being seen today for the evaluation of chest pain at the request of Dr. Carles Collet.   Assessment & Plan    1. Chest Pain Concerning for Unstable Angina -Cardiac catheterization 06/23/18 with no significant CAD with tortuous vessels noted, LVEF of 55-65% with no aortic valve stenosis and no indication for antiplatelet therapy -Trop negative x3 -EKG unremarkable, NSR HR 68 -Denies chest pain this AM>>cath site stable  2. HTN -Controlled, 119/78>119/44>117/66 -Continue amlodipine, benazepril, HCTZ  3. HLD -FLP shows total cholesterol 178, triglycerides 265, HDL 41, and LDL 84. -Continue Pravastatin 40 mg daily  4. Type 2 DM -HgbA1c, 6.7 -Per admitting team  Signed, Kathyrn Drown NP-C [facility] Pager: [phone] 06/24/2018, 11:16 AM     For questions or updates, please contact   Please consult [email or web address] for contact info under Cardiology/STEMI.

## 2018-06-24 NOTE — Discharge Summary (Addendum)
Physician Discharge Summary  Sophia [name]:[id number] DOB: [birth year] DOA: 06/22/2018  PCP: Fayrene Helper, MD  Admit date: 06/22/2018 Discharge date: 06/24/2018  Admitted From: home Discharge disposition: home   Recommendations for Outpatient Follow-Up:   1. Needs outpatient sleep study 2. Heme test stool- ? Need for repeat GI study if h/h remains low-- ? Dilution 3. Cbc at next office   Discharge Diagnosis:   Principal Problem:   Chest pain Active Problems:   Depression with anxiety   Essential hypertension   Diabetes mellitus type 2 in obese (HCC)   Angina pectoris, unspecified ([place])    Discharge Condition: Improved.  Diet recommendation: Low sodium, heart healthy.  Carbohydrate-modified.    Wound care: None.  Code status: Full.   History of Present Illness:   Sophia [name] is a 67 y.o. female with medical history significant of dm, htn, anxiety comes in with one week of exertional chest pain and sob that lasts until she sits down and rests then it goes away.  No swelling in her legs.  No cough.  No fevers.  No n/v/d.  No chest pain or sob at rest.  No previous CAD. Chest pain feels more like "a squeeze" and not actual pain.  Pt referred for admission for possible underlying ACS.     Hospital Course by Problem:   Exertional chest discomfort/Angina -Cardiology consult- s/p cath- no CAD -Troponins negative x3 - EKG-- no concerning ischemic changes - chest x-ray--negative infiltrate or edema -Echocardiogram:Left ventricle: The cavity size was normal. Wall thickness was   increased in a pattern of moderate LVH. Systolic function was   vigorous. The estimated ejection fraction was in the range of 65%   to 70%. Wall motion was normal; there were no regional wall   motion abnormalities. Doppler parameters are consistent with   abnormal left ventricular relaxation (grade 1 diastolic   dysfunction). - Aortic valve: Mildly calcified annulus.  Trileaflet; mildly   thickened leaflets. Valve area (VTI): 4.2 cm^2. Valve area   (Vmax): 2.92 cm^2. - Mitral valve: Mildly calcified annulus. Normal thickness leaflets -CT angiogram chest--negative PE, small pericardial effusion -Continue aspirin  Diabetes mellitus type 2 -Diet controlled -Hemoglobin A1c -03/17/2018 hemoglobin A1C--6.2  Hyperlipidemia -Continue statin  Essential hypertension -Continue amlodipine and benazepril/HCTZ  Fatigue -h/h mildly low -Fe panel normal -? Sleep apnea  Obesity Body mass index is 32.13 kg/m.   Medical Consultants:   cards    Discharge Exam:   Vitals:   06/24/18 1300 06/24/18 1338  BP:  (!) 132/53  Pulse:    Resp: (!) 22 19  Temp:    SpO2:     Vitals:   06/24/18 1100 06/24/18 1200 06/24/18 1300 06/24/18 1338  BP: 115/69   (!) 132/53  Pulse: 70     Resp: 19 (!) 23 (!) 22 19  Temp: 98.2 F (36.8 C)     TempSrc: Oral     SpO2: 96%     Weight:      Height:        General exam: Appears calm and comfortable.    The results of significant diagnostics from this hospitalization (including imaging, microbiology, ancillary and laboratory) are listed below for reference.     Procedures and Diagnostic Studies:   Dg Chest 2 View  Result Date: 06/22/2018 CLINICAL DATA:  Dyspnea and chest tightness for 6-7 days. EXAM: CHEST - 2 VIEW COMPARISON:  09/02/2014 FINDINGS: Heart is top-normal in size. Mild-to-moderate aortic  atherosclerosis without aneurysmal dilatation is noted. There is minimal atelectasis at the lung bases. No overt pulmonary edema, effusion or pneumothorax. Stable thoracic spondylosis without acute osseous appearing abnormality. IMPRESSION: No active cardiopulmonary disease. Aortic atherosclerosis. Bibasilar atelectasis. Electronically Signed   By: Ashley Royalty M.D.   On: 06/22/2018 17:45   Ct Angio Chest Pe W And/or Wo Contrast  Result Date: 06/22/2018 CLINICAL DATA:  67 year old female with acute chest pain and  shortness of breath for 5 days. EXAM: CT ANGIOGRAPHY CHEST WITH CONTRAST TECHNIQUE: Multidetector CT imaging of the chest was performed using the standard protocol during bolus administration of intravenous contrast. Multiplanar CT image reconstructions and MIPs were obtained to evaluate the vascular anatomy. CONTRAST:  16mL ISOVUE-370 IOPAMIDOL (ISOVUE-370) INJECTION 76% COMPARISON:  None. FINDINGS: Cardiovascular: This study is technically borderline and respiratory motion artifact decreases sensitivity in the LOWER lungs. No pulmonary emboli are identified. Cardiomegaly and coronary artery atherosclerotic calcifications noted. Thoracic aortic atherosclerotic calcifications noted without aneurysm or definite dissection. A very small pericardial effusion is noted. Mediastinum/Nodes: No enlarged mediastinal, hilar, or axillary lymph nodes. Thyroid gland, trachea, and esophagus demonstrate no significant findings. Lungs/Pleura: No airspace disease, nodule, mass, consolidation, pleural effusion or pneumothorax. Upper Abdomen: No acute abnormality. Musculoskeletal: No acute or suspicious bony abnormalities. Review of the MIP images confirms the above findings. IMPRESSION: 1. No evidence of pulmonary emboli but study is technically borderline. 2. Cardiomegaly, coronary artery disease and very small pericardial effusion. 3.  Aortic Atherosclerosis (ICD10-I70.0). Electronically Signed   By: Margarette Canada M.D.   On: 06/22/2018 20:30     Labs:   Basic Metabolic Panel: Recent Labs  Lab 06/22/18 1638 06/23/18 0352  NA 138 140  K 3.9 3.8  CL 99 103  CO2 31 31  GLUCOSE 110* 144*  BUN 22 19  CREATININE 0.95 0.79  CALCIUM 9.5 9.0  MG 2.0  --    GFR Estimated Creatinine Clearance: 84.2 mL/min (by C-G formula based on SCr of 0.79 mg/dL). Liver Function Tests: No results for input(s): AST, ALT, ALKPHOS, BILITOT, PROT, ALBUMIN in the last 168 hours. No results for input(s): LIPASE, AMYLASE in the last 168  hours. No results for input(s): AMMONIA in the last 168 hours. Coagulation profile No results for input(s): INR, PROTIME in the last 168 hours.  CBC: Recent Labs  Lab 06/22/18 1638 06/23/18 0352 06/24/18 0339  WBC 6.6 5.9 5.9  HGB 12.2 11.0* 10.9*  HCT 38.5 35.4* 36.1  MCV 81.2 81.8 82.0  PLT 243 224 231   Cardiac Enzymes: Recent Labs  Lab 06/22/18 1638 06/22/18 2222 06/23/18 0352 06/23/18 0948  TROPONINI <0.03 <0.03 <0.03 <0.03   BNP: Invalid input(s): POCBNP CBG: Recent Labs  Lab 06/23/18 1402 06/23/18 1821 06/23/18 2245 06/24/18 0714 06/24/18 1252  GLUCAP 93 79 124* 109* 132*   D-Dimer Recent Labs    06/22/18 1638  DDIMER 0.80*   Hgb A1c Recent Labs    06/23/18 0352  HGBA1C 6.7*   Lipid Profile Recent Labs    06/23/18 0352  CHOL 178  HDL 41  LDLCALC 84  TRIG 265*  CHOLHDL 4.3   Thyroid function studies Recent Labs    06/22/18 1638  TSH 1.620   Anemia work up Recent Labs    06/24/18 0842  FERRITIN 181  TIBC 295  IRON 77   Microbiology Recent Results (from the past 240 hour(s))  MRSA PCR Screening     Status: None   Collection Time: 06/22/18 10:35 PM  Result  Value Ref Range Status   MRSA by PCR NEGATIVE NEGATIVE Final    Comment:        The GeneXpert MRSA Assay (FDA approved for NASAL specimens only), is one component of a comprehensive MRSA colonization surveillance program. It is not intended to diagnose MRSA infection nor to guide or monitor treatment for MRSA infections. Performed at [facility], [address], [place],  [address]      Discharge Instructions:   Discharge Instructions    Diet - low sodium heart healthy   Complete by:  As directed    Discharge instructions   Complete by:  As directed    See PCP to discuss OSA evaluation Can try OTC zantac in place of protonix   Increase activity slowly   Complete by:  As directed      Allergies as of 06/24/2018   No Known Allergies     Medication  List    STOP taking these medications   diltiazem 240 MG 24 hr capsule Commonly known as:  CARDIZEM CD   predniSONE 5 MG tablet Commonly known as:  DELTASONE     TAKE these medications   acetaminophen 325 MG tablet Commonly known as:  TYLENOL Take 325 mg by mouth every 6 (six) hours as needed for moderate pain.   ALPRAZolam 1 MG tablet Commonly known as:  XANAX One tablet by mouth for extreme anxiety or panic attack What changed:    how much to take  how to take this  when to take this  reasons to take this  additional instructions   amLODipine 10 MG tablet Commonly known as:  NORVASC Take 1 tablet (10 mg total) by mouth daily.   aspirin EC 81 MG tablet Take 81 mg by mouth daily.   benazepril-hydrochlorthiazide 20-12.5 MG tablet Commonly known as:  LOTENSIN HCT Take 2 tablets by mouth daily.   Fish Oil 1000 MG Caps Take 1 capsule by mouth daily.   FLUoxetine 40 MG capsule Commonly known as:  PROZAC TAKE 1 CAPSULE EVERY DAY   gabapentin 100 MG capsule Commonly known as:  NEURONTIN Take 100 mg by mouth 3 (three) times daily.   loratadine 10 MG tablet Commonly known as:  CLARITIN Take 1 tablet (10 mg total) by mouth daily as needed for allergies.   meclizine 25 MG tablet Commonly known as:  ANTIVERT Take 25 mg by mouth 3 (three) times daily as needed for dizziness.   multivitamin with minerals Tabs tablet Take 1 tablet by mouth daily.   pantoprazole 40 MG tablet Commonly known as:  PROTONIX TAKE 1 TABLET EVERY DAY   pravastatin 40 MG tablet Commonly known as:  PRAVACHOL TAKE 1 TABLET EVERY EVENING   temazepam 15 MG capsule Commonly known as:  RESTORIL TAKE 1 CAPSULE AT BEDTIME AS NEEDED FOR SLEEP   UNABLE TO FIND Take 5 mLs by mouth daily as needed (for energy). Black seed black/cumin seed oil. 1 tsp daily   VITAMIN B-12 PO Take 1,000 mcg by mouth daily.   VITAMIN D3 PO Take 2,000 Units by mouth daily.      Follow-up Information     Fayrene Helper, MD Follow up in 1 week(s).   Specialty:  Family Medicine Contact information: [address] [place] [address] [phone]        Josue Hector, MD .   Specialty:  Cardiology Contact information: [phone] [address] [place] [address] [phone]  Time coordinating discharge: 25 min  Signed:  Geradine Girt  [facility] 06/24/2018, 2:03 PM

## 2018-06-24 NOTE — Progress Notes (Addendum)
Progress Note  Patient Name: Sophia [name] Date of Encounter: 06/24/2018  Primary Cardiologist: Jenkins Rouge, MD   Subjective   67 year old female transferred from [facility].  She presents with chest pain and fatigue. Cath yesterday revealed no significant coronary artery disease.  Inpatient Medications    Scheduled Meds: . amLODipine  10 mg Oral Daily  . aspirin EC  81 mg Oral Daily  . benazepril  20 mg Oral Daily  . FLUoxetine  40 mg Oral Daily  . gabapentin  100 mg Oral TID  . hydrochlorothiazide  12.5 mg Oral Daily  . multivitamin with minerals  1 tablet Oral Daily  . pantoprazole  40 mg Oral Daily  . pravastatin  40 mg Oral QPM  . sodium chloride flush  3 mL Intravenous Q12H   Continuous Infusions: . sodium chloride     PRN Meds: sodium chloride, acetaminophen, ALPRAZolam, loratadine, meclizine, ondansetron **OR** ondansetron (ZOFRAN) IV, sodium chloride flush   Vital Signs    Vitals:   06/23/18 1900 06/23/18 1930 06/24/18 0412 06/24/18 0755  BP: (!) 147/58 117/66 (!) 119/44 119/78  Pulse: 65 62 66 74  Resp: 13 13 14 14   Temp:   98 F (36.7 C) 97.9 F (36.6 C)  TempSrc:  Oral Oral Oral  SpO2: 98% 98% 92% 97%  Weight:   217 lb 9.5 oz (98.7 kg)   Height:        Intake/Output Summary (Last 24 hours) at 06/24/2018 1121 Last data filed at 06/24/2018 0700 Gross per 24 hour  Intake 1115.8 ml  Output -  Net 1115.8 ml   Filed Weights   06/22/18 2242 06/23/18 0500 06/24/18 0412  Weight: 216 lb 0.8 oz (98 kg) 216 lb 0.8 oz (98 kg) 217 lb 9.5 oz (98.7 kg)    Telemetry    NSR  - Personally Reviewed  ECG     NSR  - Personally Reviewed  Physical Exam   GEN: No acute distress.   Neck: No JVD Cardiac: RRR, no murmurs, rubs, or gallops.  Respiratory: Clear to auscultation bilaterally. GI: Soft, nontender, non-distended  MS: No edema; No deformity. R radial cath site looks good  Neuro:  Nonfocal  Psych: Normal affect   Labs     Chemistry Recent Labs  Lab 06/22/18 1638 06/23/18 0352  NA 138 140  K 3.9 3.8  CL 99 103  CO2 31 31  GLUCOSE 110* 144*  BUN 22 19  CREATININE 0.95 0.79  CALCIUM 9.5 9.0  GFRNONAA >60 >60  GFRAA >60 >60  ANIONGAP 8 6     Hematology Recent Labs  Lab 06/22/18 1638 06/23/18 0352 06/24/18 0339  WBC 6.6 5.9 5.9  RBC 4.74 4.33 4.40  HGB 12.2 11.0* 10.9*  HCT 38.5 35.4* 36.1  MCV 81.2 81.8 82.0  MCH 25.7* 25.4* 24.8*  MCHC 31.7 31.1 30.2  RDW 16.1* 16.1* 15.9*  PLT 243 224 231    Cardiac Enzymes Recent Labs  Lab 06/22/18 1638 06/22/18 2222 06/23/18 0352 06/23/18 0948  TROPONINI <0.03 <0.03 <0.03 <0.03   No results for input(s): TROPIPOC in the last 168 hours.   BNP Recent Labs  Lab 06/22/18 1632  BNP 14.0     DDimer  Recent Labs  Lab 06/22/18 1638  DDIMER 0.80*     Radiology    Dg Chest 2 View  Result Date: 06/22/2018 CLINICAL DATA:  Dyspnea and chest tightness for 6-7 days. EXAM: CHEST - 2 VIEW COMPARISON:  09/02/2014 FINDINGS:  Heart is top-normal in size. Mild-to-moderate aortic atherosclerosis without aneurysmal dilatation is noted. There is minimal atelectasis at the lung bases. No overt pulmonary edema, effusion or pneumothorax. Stable thoracic spondylosis without acute osseous appearing abnormality. IMPRESSION: No active cardiopulmonary disease. Aortic atherosclerosis. Bibasilar atelectasis. Electronically Signed   By: Ashley Royalty M.D.   On: 06/22/2018 17:45   Ct Angio Chest Pe W And/or Wo Contrast  Result Date: 06/22/2018 CLINICAL DATA:  67 year old female with acute chest pain and shortness of breath for 5 days. EXAM: CT ANGIOGRAPHY CHEST WITH CONTRAST TECHNIQUE: Multidetector CT imaging of the chest was performed using the standard protocol during bolus administration of intravenous contrast. Multiplanar CT image reconstructions and MIPs were obtained to evaluate the vascular anatomy. CONTRAST:  181mL ISOVUE-370 IOPAMIDOL (ISOVUE-370) INJECTION 76%  COMPARISON:  None. FINDINGS: Cardiovascular: This study is technically borderline and respiratory motion artifact decreases sensitivity in the LOWER lungs. No pulmonary emboli are identified. Cardiomegaly and coronary artery atherosclerotic calcifications noted. Thoracic aortic atherosclerotic calcifications noted without aneurysm or definite dissection. A very small pericardial effusion is noted. Mediastinum/Nodes: No enlarged mediastinal, hilar, or axillary lymph nodes. Thyroid gland, trachea, and esophagus demonstrate no significant findings. Lungs/Pleura: No airspace disease, nodule, mass, consolidation, pleural effusion or pneumothorax. Upper Abdomen: No acute abnormality. Musculoskeletal: No acute or suspicious bony abnormalities. Review of the MIP images confirms the above findings. IMPRESSION: 1. No evidence of pulmonary emboli but study is technically borderline. 2. Cardiomegaly, coronary artery disease and very small pericardial effusion. 3.  Aortic Atherosclerosis (ICD10-I70.0). Electronically Signed   By: Margarette Canada M.D.   On: 06/22/2018 20:30    Cardiac Studies     Patient Profile     67 y.o. female with DOE and fatigue , chest pain   Assessment & Plan    1.  Chest pain: Patient has not had any further episode chest pain.  She does have dyspnea on exertion and generalized fatigue.  Heart catheterization was negative for coronary artery disease.  Cardiogram that showed grade 1 diastolic dysfunction.  She has normal left ventricular systolic function. I cautioned her about eating processed meats-which she still eats quite a bit of. I have advised her to lose weight. All up with Dr. Harl Bowie up in the [facility] office as needed.  [facility] will sign off.   Medication Recommendations:  Weight loss,  Other recommendations (labs, testing, etc):   Follow up as an outpatient:  With Dr. Harl Bowie as needed.   For questions or updates, please contact [place] Please  consult [email or web address] for contact info under Cardiology/STEMI.      Signed, Mertie Moores, MD  06/24/2018, 11:21 AM

## 2018-06-24 NOTE — Care Management Note (Signed)
Case Management Note  Patient Details  Name: Sophia [name] MRN: [id number] Date of Birth: 1951-01-31  Subjective/Objective:   From home with spouse, s/p cath, she will not need antiplatelet therapy.  NCM spoke with patient offered [facility] agency list for HHPT, she states she does not feel she needs HHPT and she does not feel she needs rolling walker.  NCM informed her this is recs from physical  therapy.  She still does not want services.                  Action/Plan: DC home when ready.  Expected Discharge Date:  06/25/18               Expected Discharge Plan:  [place]  In-House Referral:     Discharge planning Services  CM Consult  Post Acute Care Choice:  Home Health Choice offered to:  Patient  DME Arranged:    DME Agency:     HH Arranged:  PT, Patient Refused [place] Agency:     Status of Service:  Completed, signed off  If discussed at [place] of Stay Meetings, dates discussed:    Additional Comments:  Zenon Mayo, RN 06/24/2018, 8:49 AM

## 2018-06-25 ENCOUNTER — Ambulatory Visit (INDEPENDENT_AMBULATORY_CARE_PROVIDER_SITE_OTHER): Payer: Medicare PPO | Admitting: Family Medicine

## 2018-06-25 ENCOUNTER — Telehealth: Payer: Self-pay

## 2018-06-25 ENCOUNTER — Encounter: Payer: Self-pay | Admitting: Family Medicine

## 2018-06-25 VITALS — BP 120/80 | HR 73 | Resp 16 | Ht 69.0 in | Wt 218.0 lb

## 2018-06-25 DIAGNOSIS — E1169 Type 2 diabetes mellitus with other specified complication: Secondary | ICD-10-CM | POA: Diagnosis not present

## 2018-06-25 DIAGNOSIS — E669 Obesity, unspecified: Secondary | ICD-10-CM

## 2018-06-25 DIAGNOSIS — G4733 Obstructive sleep apnea (adult) (pediatric): Secondary | ICD-10-CM

## 2018-06-25 DIAGNOSIS — D649 Anemia, unspecified: Secondary | ICD-10-CM

## 2018-06-25 DIAGNOSIS — Z09 Encounter for follow-up examination after completed treatment for conditions other than malignant neoplasm: Secondary | ICD-10-CM

## 2018-06-25 NOTE — Patient Instructions (Addendum)
Pls cancel September appt with Dr Moshe Cipro, schedule for f/u in early October instead  Keep wellness as  Before, anfd flu vaccine then  You are being referred to nutrition educator for help with diabetes, obesity, hyperlipidemia  Keep appt with heart Dr   Please schedule mammogram at checkout  You are referred to dr Merlene Laughter for sleep apnea, this is important  STOP Cardizem Increase activity gradually , HEART IS GOOD

## 2018-06-25 NOTE — Telephone Encounter (Signed)
Transition Care Management Follow-up Telephone Call   Date discharged?   06/25/2018             How have you been since you were released from the hospital? very weak and tired (drained)   Do you understand why you were in the hospital? Chest pain and tightness and being very weak and she had to have a heart cath   Do you understand the discharge instructions? yes   Where were you discharged to?  home   Items Reviewed:  Medications reviewed: yes  Allergies reviewed:  yes  Dietary changes reviewed: low sodium/heart healthy  Referrals reviewed: yes   Functional Questionnaire:   Activities of Daily Living (ADLs):  her husband and family are there helping her. She can do ADL's herself but has to go slow     Any transportation issues/concerns?: no   Any patient concerns? wants to know why she is still so weak    Confirmed importance and date/time of follow-up visits scheduled has appt 06/25/2018 at 10:00am     Confirmed with patient if condition begins to worsen call PCP or go to the ER.  Patient was given the office number and encouraged to call back with question or concerns.  : yes, patient understands

## 2018-07-03 ENCOUNTER — Ambulatory Visit (HOSPITAL_COMMUNITY)
Admission: RE | Admit: 2018-07-03 | Discharge: 2018-07-03 | Disposition: A | Discharge status: Home / Self Care | Payer: Medicare PPO | Source: Ambulatory Visit | Attending: Family Medicine | Admitting: Family Medicine

## 2018-07-03 ENCOUNTER — Encounter (HOSPITAL_COMMUNITY): Payer: Self-pay

## 2018-07-03 DIAGNOSIS — Z1239 Encounter for other screening for malignant neoplasm of breast: Secondary | ICD-10-CM

## 2018-07-03 DIAGNOSIS — Z1231 Encounter for screening mammogram for malignant neoplasm of breast: Secondary | ICD-10-CM | POA: Diagnosis not present

## 2018-07-06 ENCOUNTER — Ambulatory Visit (INDEPENDENT_AMBULATORY_CARE_PROVIDER_SITE_OTHER): Payer: Medicare PPO

## 2018-07-06 VITALS — BP 137/83 | HR 80 | Resp 16 | Ht 69.0 in | Wt 215.0 lb

## 2018-07-06 DIAGNOSIS — Z0000 Encounter for general adult medical examination without abnormal findings: Secondary | ICD-10-CM | POA: Diagnosis not present

## 2018-07-06 DIAGNOSIS — Z23 Encounter for immunization: Secondary | ICD-10-CM

## 2018-07-06 NOTE — Progress Notes (Signed)
Subjective:   Sophia [name] is a 67 y.o. female who presents for Medicare Annual (Subsequent) preventive examination.  Review of Systems:   Cardiac Risk Factors include: advanced age (>66men, >86 women);diabetes mellitus;dyslipidemia;hypertension;obesity (BMI >30kg/m2)     Objective:     Vitals: BP 137/83   There is no height or weight on file to calculate BMI.  Advanced Directives 07/06/2018 06/22/2018 06/22/2018 11/20/2015 03/13/2015 09/02/2014 04/22/2014  Does Patient Have a Medical Advance Directive? No No No No No No Patient does not have advance directive;Patient would like information  Would patient like information on creating a medical advance directive? Yes (ED - Information included in AVS) No - Patient declined No - Patient declined No - patient declined information Yes - Educational materials given No - patient declined information -  Pre-existing out of facility DNR order (yellow form or pink MOST form) - - - - - - -    Tobacco Social History   Tobacco Use  Smoking Status Former Smoker  . Packs/day: 0.25  . Years: 20.00  . Pack years: 5.00  . Types: Cigarettes  . Last attempt to quit: 01/05/1990  . Years since quitting: 28.5  Smokeless Tobacco Former Systems developer  . Types: Snuff  . Quit date: 03/18/2018     Counseling given: Not Answered   Clinical Intake:  Pre-visit preparation completed: Yes  Pain : No/denies pain Pain Score: 0-No pain     Nutritional Status: BMI > 30  Obese Diabetes: Yes CBG done?: No Did pt. bring in CBG monitor from home?: No  How often do you need to have someone help you when you read instructions, pamphlets, or other written materials from your doctor or pharmacy?: 1 - Never What is the last grade level you completed in school?: 9th grade  Interpreter Needed?: No  Information entered by :: Jemma Rasp LPN   Past Medical History:  Diagnosis Date  . Anxiety   . Arthritis   . Depression   . Diabetes mellitus without complication ([place])    . GERD (gastroesophageal reflux disease)   . Hypercholesteremia   . Hypertension   . PONV (postoperative nausea and vomiting)    Past Surgical History:  Procedure Laterality Date  . ABDOMINAL HYSTERECTOMY  1982   tubes and womb, bleeding and ectopic  . ABDOMINAL SURGERY     removal of tumors  . BREAST SURGERY Left 1983   benign tumor  . CATARACT EXTRACTION W/PHACO Right 01/11/2014   Procedure: CATARACT EXTRACTION PHACO AND INTRAOCULAR LENS PLACEMENT (IOC);  Surgeon: Elta Guadeloupe T. Gershon Crane, MD;  Location: [facility];  Service: Ophthalmology;  Laterality: Right;  CDE 5.57  . CATARACT EXTRACTION W/PHACO Left 01/25/2014   Procedure: CATARACT EXTRACTION PHACO AND INTRAOCULAR LENS PLACEMENT (IOC);  Surgeon: Elta Guadeloupe T. Gershon Crane, MD;  Location: [facility];  Service: Ophthalmology;  Laterality: Left;  CDE:5.19  . COLONOSCOPY  2007 BRB[facility]   NL EXAM  . COLONOSCOPY N/A 04/22/2014   Dr. Fields:Normal mucosa in the terminal ileum/Two COLON polyps REMOVED/ Mild diverticulosis in the ascending colon and transverse colon/The LEFT colon IS redundant/Small internal hemorrhoids. Path: tubular adenoma. Next colonoscopy in 5-10 years  . ESOPHAGOGASTRODUODENOSCOPY N/A 04/22/2014   Dr. Fields:MICROCYTIC ANEMIA MOST LIKELY DUE TO ASA/VOLTAREN/Small hiatal hernia/MODERATE Non-erosive gastritis. Negative H.pylori  . LEFT HEART CATH AND CORONARY ANGIOGRAPHY N/A 06/23/2018   Procedure: LEFT HEART CATH AND CORONARY ANGIOGRAPHY;  Surgeon: Jettie Booze, MD;  Location: [facility];  Service: Cardiovascular;  Laterality: N/A;  . 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 ULTRASOUND GUIDANCE FOR VASCULAR ACCESS  06/23/2018   Procedure: Ultrasound Guidance For Vascular Access;  Surgeon: Jettie Booze, MD;  Location: [facility];  Service: Cardiovascular;;  . UPPER GASTROINTESTINAL ENDOSCOPY  2007 CHEST PAIN   NL EXAM  . YAG LASER APPLICATION Right 2/50/5397   Procedure: YAG LASER APPLICATION;  Surgeon: Elta Guadeloupe T.  Gershon Crane, MD;  Location: [facility];  Service: Ophthalmology;  Laterality: Right;  . YAG LASER APPLICATION Left 6/73/4193   Procedure: YAG LASER APPLICATION;  Surgeon: Rutherford Guys, MD;  Location: [facility];  Service: Ophthalmology;  Laterality: Left;   Family History  Problem Relation Age of Onset  . Colon polyps Sister 18  . Diabetes Sister   . Hypertension Sister   . Kidney disease Sister   . Arthritis Father   . Cancer Father        prostate   . Hypertension Father   . Diabetes Sister   . Hypertension Sister   . CAD Paternal Grandmother   . Colon cancer Neg Hx    Social History   Socioeconomic History  . Marital status: Married    Spouse name: Not on file  . Number of children: Not on file  . Years of education: Not on file  . Highest education level: Not on file  Occupational History  . Not on file  Social Needs  . Financial resource strain: Not hard at all  . Food insecurity:    Worry: Never true    Inability: Never true  . Transportation needs:    Medical: No    Non-medical: No  Tobacco Use  . Smoking status: Former Smoker    Packs/day: 0.25    Years: 20.00    Pack years: 5.00    Types: Cigarettes    Last attempt to quit: 01/05/1990    Years since quitting: 28.5  . Smokeless tobacco: Former Systems developer    Types: Snuff    Quit date: 03/18/2018  Substance and Sexual Activity  . Alcohol use: No  . Drug use: No  . Sexual activity: Not Currently    Birth control/protection: Surgical  Lifestyle  . Physical activity:    Days per week: 7 days    Minutes per session: 10 min  . Stress: Not at all  Relationships  . Social connections:    Talks on phone: More than three times a week    Gets together: Once a week    Attends religious service: More than 4 times per year    Active member of club or organization: No    Attends meetings of clubs or organizations: Never    Relationship status: Married  Other Topics Concern  . Not on file  Social History Narrative  . Not on  file    Outpatient Encounter Medications as of 07/06/2018  Medication Sig  . acetaminophen (TYLENOL) 325 MG tablet Take 325 mg by mouth every 6 (six) hours as needed for moderate pain.  Marland Kitchen ALPRAZolam (XANAX) 1 MG tablet One tablet by mouth for extreme anxiety or panic attack (Patient taking differently: Take 1 mg by mouth daily as needed for anxiety or sleep. One tablet by mouth for extreme anxiety or panic attack)  . amLODipine (NORVASC) 10 MG tablet Take 1 tablet (10 mg total) by mouth daily.  Marland Kitchen aspirin EC 81 MG tablet Take 81 mg by mouth daily.  . benazepril-hydrochlorthiazide (LOTENSIN HCT) 20-12.5 MG tablet Take 2 tablets by mouth  daily.  . Cholecalciferol (VITAMIN D3 PO) Take 2,000 Units by mouth daily.  . Cyanocobalamin (VITAMIN B-12 PO) Take 1,000 mcg by mouth daily.  Marland Kitchen FLUoxetine (PROZAC) 40 MG capsule TAKE 1 CAPSULE EVERY DAY  . gabapentin (NEURONTIN) 100 MG capsule Take 100 mg by mouth 3 (three) times daily.  Marland Kitchen loratadine (CLARITIN) 10 MG tablet Take 1 tablet (10 mg total) by mouth daily as needed for allergies.  . Multiple Vitamin (MULTIVITAMIN WITH MINERALS) TABS tablet Take 1 tablet by mouth daily.  . Omega-3 Fatty Acids (FISH OIL) 1000 MG CAPS Take 1 capsule by mouth daily.  . pantoprazole (PROTONIX) 40 MG tablet TAKE 1 TABLET EVERY DAY  . pravastatin (PRAVACHOL) 40 MG tablet TAKE 1 TABLET EVERY EVENING  . temazepam (RESTORIL) 15 MG capsule TAKE 1 CAPSULE AT BEDTIME AS NEEDED FOR SLEEP  . UNABLE TO FIND Take 5 mLs by mouth daily as needed (for energy). Black seed black/cumin seed oil. 1 tsp daily  . [DISCONTINUED] meclizine (ANTIVERT) 25 MG tablet Take 25 mg by mouth 3 (three) times daily as needed for dizziness.   No facility-administered encounter medications on file as of 07/06/2018.     Activities of Daily Living In your present state of health, do you have any difficulty performing the following activities: 07/06/2018 06/22/2018  Hearing? N -  Vision? N -  Difficulty  concentrating or making decisions? N -  Walking or climbing stairs? Y -  Dressing or bathing? N -  Doing errands, shopping? N N  Preparing Food and eating ? N -  Using the Toilet? N -  In the past six months, have you accidently leaked urine? N -  Do you have problems with loss of bowel control? N -  Managing your Medications? N -  Managing your Finances? N -  Housekeeping or managing your Housekeeping? N -  Some recent data might be hidden    Patient Care Team: Fayrene Helper, MD as PCP - General Josue Hector, MD as PCP - Cardiology (Cardiology) Danie Binder, MD as Consulting Physician (Gastroenterology) Rutherford Guys, MD as Consulting Physician (Ophthalmology) Carole Civil, MD as Consulting Physician (Orthopedic Surgery)    Assessment:   This is a routine wellness examination for Sophia [name].  Exercise Activities and Dietary recommendations Current Exercise Habits: The patient does not participate in regular exercise at present, Exercise limited by: orthopedic condition(s)  Goals    . Blood Pressure < 140/90       Fall Risk Fall Risk  07/06/2018 06/25/2018 06/22/2018 06/08/2018 05/20/2018  Falls in the past year? No No No No No   Is the patient's home free of loose throw rugs in walkways, pet beds, electrical cords, etc?   yes      Grab bars in the bathroom? yes      Handrails on the stairs?   yes      Adequate lighting?   yes  Timed Get Up and Go performed:   Depression Screen PHQ 2/9 Scores 07/06/2018 06/25/2018 06/22/2018 06/08/2018  PHQ - 2 Score 0 0 0 5  PHQ- 9 Score 5 - 4 18     Cognitive Function     6CIT Screen 07/06/2018  What Year? 0 points  What month? 0 points  What time? 0 points  Count back from 20 0 points  Months in reverse 4 points  Repeat phrase 2 points  Total Score 6    Immunization History  Administered Date(s) Administered  . Influenza Split  08/19/2014  . Influenza Whole 09/20/1996  . Influenza,inj,Quad PF,6+ Mos 09/15/2015,  08/06/2016, 07/06/2018  . Pneumococcal Conjugate-13 01/25/2015  . Pneumococcal Polysaccharide-23 11/28/2010, 03/25/2016  . Tdap 04/29/2014  . Zoster 11/28/2010    Qualifies for Shingles Vaccine? Ask insurance if covered  Screening Tests Health Maintenance  Topic Date Due  . HEMOGLOBIN A1C  12/24/2018  . OPHTHALMOLOGY EXAM  01/21/2019  . COLONOSCOPY  04/23/2019  . FOOT EXAM  05/23/2019  . MAMMOGRAM  07/03/2020  . TETANUS/TDAP  04/29/2024  . INFLUENZA VACCINE  Completed  . DEXA SCAN  Completed  . Hepatitis C Screening  Completed  . PNA vac Low Risk Adult  Completed    Cancer Screenings: Lung: Low Dose CT Chest recommended if Age 74-80 years, 30 pack-year currently smoking OR have quit w/in 15years. Patient does not qualify. Breast:  Up to date on Mammogram? Yes   Up to date of Bone Density/Dexa? Yes Colorectal: up to date   Additional Screenings:  Hepatitis C Screening:      Plan:     I have personally reviewed and noted the following in the patient's chart:   . Medical and social history . Use of alcohol, tobacco or illicit drugs  . Current medications and supplements . Functional ability and status . Nutritional status . Physical activity . Advanced directives . List of other physicians . Hospitalizations, surgeries, and ER visits in previous 12 months . Vitals . Screenings to include cognitive, depression, and falls . Referrals and appointments  In addition, I have reviewed and discussed with patient certain preventive protocols, quality metrics, and best practice recommendations. A written personalized care plan for preventive services as well as general preventive health recommendations were provided to patient.     Kate Sable, LPN, LPN  03/04/4080

## 2018-07-06 NOTE — Patient Instructions (Addendum)
Sophia [name] , Thank you for taking time to come for your Medicare Wellness Visit. I appreciate your ongoing commitment to your health goals. Please review the following plan we discussed and let me know if I can assist you in the future.     Ask insurance if shingrix is covered and if so, we can do this at your next visit.    Screening recommendations/referrals: Colonoscopy: up to date Mammogram: up to date Bone Density: up to date Recommended yearly ophthalmology/optometry visit for glaucoma screening and checkup Recommended yearly dental visit for hygiene and checkup  Vaccinations: Influenza vaccine: given  Pneumococcal vaccine: done  Tdap vaccine: done Shingles vaccine: ask insurance if shingrix is covered   Advanced directives: done  Conditions/risks identified: done  Next appointment: scheduled   Preventive Care 9 Years and Older, Female Preventive care refers to lifestyle choices and visits with your health care provider that can promote health and wellness. What does preventive care include?  A yearly physical exam. This is also called an annual well check.  Dental exams once or twice a year.  Routine eye exams. Ask your health care provider how often you should have your eyes checked.  Personal lifestyle choices, including:  Daily care of your teeth and gums.  Regular physical activity.  Eating a healthy diet.  Avoiding tobacco and drug use.  Limiting alcohol use.  Practicing safe sex.  Taking low-dose aspirin every day.  Taking vitamin and mineral supplements as recommended by your health care provider. What happens during an annual well check? The services and screenings done by your health care provider during your annual well check will depend on your age, overall health, lifestyle risk factors, and family history of disease. Counseling  Your health care provider may ask you questions about your:  Alcohol use.  Tobacco use.  Drug  use.  Emotional well-being.  Home and relationship well-being.  Sexual activity.  Eating habits.  History of falls.  Memory and ability to understand (cognition).  Work and work Statistician.  Reproductive health. Screening  You may have the following tests or measurements:  Height, weight, and BMI.  Blood pressure.  Lipid and cholesterol levels. These may be checked every 5 years, or more frequently if you are over 69 years old.  Skin check.  Lung cancer screening. You may have this screening every year starting at age 60 if you have a 30-pack-year history of smoking and currently smoke or have quit within the past 15 years.  Fecal occult blood test (FOBT) of the stool. You may have this test every year starting at age 27.  Flexible sigmoidoscopy or colonoscopy. You may have a sigmoidoscopy every 5 years or a colonoscopy every 10 years starting at age 51.  Hepatitis C blood test.  Hepatitis B blood test.  Sexually transmitted disease (STD) testing.  Diabetes screening. This is done by checking your blood sugar (glucose) after you have not eaten for a while (fasting). You may have this done every 1-3 years.  Bone density scan. This is done to screen for osteoporosis. You may have this done starting at age 2.  Mammogram. This may be done every 1-2 years. Talk to your health care provider about how often you should have regular mammograms. Talk with your health care provider about your test results, treatment options, and if necessary, the need for more tests. Vaccines  Your health care provider may recommend certain vaccines, such as:  Influenza vaccine. This is recommended every year.  Tetanus, diphtheria, and acellular pertussis (Tdap, Td) vaccine. You may need a Td booster every 10 years.  Zoster vaccine. You may need this after age 44.  Pneumococcal 13-valent conjugate (PCV13) vaccine. One dose is recommended after age 55.  Pneumococcal polysaccharide  (PPSV23) vaccine. One dose is recommended after age 5. Talk to your health care provider about which screenings and vaccines you need and how often you need them. This information is not intended to replace advice given to you by your health care provider. Make sure you discuss any questions you have with your health care provider. Document Released: 12/01/2015 Document Revised: 07/24/2016 Document Reviewed: 09/05/2015 Elsevier Interactive Patient Education  2017 [place] Prevention in the Home Falls can cause injuries. They can happen to people of all ages. There are many things you can do to make your home safe and to help prevent falls. What can I do on the outside of my home?  Regularly fix the edges of walkways and driveways and fix any cracks.  Remove anything that might make you trip as you walk through a door, such as a raised step or threshold.  Trim any bushes or trees on the path to your home.  Use bright outdoor lighting.  Clear any walking paths of anything that might make someone trip, such as rocks or tools.  Regularly check to see if handrails are loose or broken. Make sure that both sides of any steps have handrails.  Any raised decks and porches should have guardrails on the edges.  Have any leaves, snow, or ice cleared regularly.  Use sand or salt on walking paths during winter.  Clean up any spills in your garage right away. This includes oil or grease spills. What can I do in the bathroom?  Use night lights.  Install grab bars by the toilet and in the tub and shower. Do not use towel bars as grab bars.  Use non-skid mats or decals in the tub or shower.  If you need to sit down in the shower, use a plastic, non-slip stool.  Keep the floor dry. Clean up any water that spills on the floor as soon as it happens.  Remove soap buildup in the tub or shower regularly.  Attach bath mats securely with double-sided non-slip rug tape.  Do not have  throw rugs and other things on the floor that can make you trip. What can I do in the bedroom?  Use night lights.  Make sure that you have a light by your bed that is easy to reach.  Do not use any sheets or blankets that are too big for your bed. They should not hang down onto the floor.  Have a firm chair that has side arms. You can use this for support while you get dressed.  Do not have throw rugs and other things on the floor that can make you trip. What can I do in the kitchen?  Clean up any spills right away.  Avoid walking on wet floors.  Keep items that you use a lot in easy-to-reach places.  If you need to reach something above you, use a strong step stool that has a grab bar.  Keep electrical cords out of the way.  Do not use floor polish or wax that makes floors slippery. If you must use wax, use non-skid floor wax.  Do not have throw rugs and other things on the floor that can make you trip. What can I do  with my stairs?  Do not leave any items on the stairs.  Make sure that there are handrails on both sides of the stairs and use them. Fix handrails that are broken or loose. Make sure that handrails are as long as the stairways.  Check any carpeting to make sure that it is firmly attached to the stairs. Fix any carpet that is loose or worn.  Avoid having throw rugs at the top or bottom of the stairs. If you do have throw rugs, attach them to the floor with carpet tape.  Make sure that you have a light switch at the top of the stairs and the bottom of the stairs. If you do not have them, ask someone to add them for you. What else can I do to help prevent falls?  Wear shoes that:  Do not have high heels.  Have rubber bottoms.  Are comfortable and fit you well.  Are closed at the toe. Do not wear sandals.  If you use a stepladder:  Make sure that it is fully opened. Do not climb a closed stepladder.  Make sure that both sides of the stepladder are  locked into place.  Ask someone to hold it for you, if possible.  Clearly mark and make sure that you can see:  Any grab bars or handrails.  First and last steps.  Where the edge of each step is.  Use tools that help you move around (mobility aids) if they are needed. These include:  Canes.  Walkers.  Scooters.  Crutches.  Turn on the lights when you go into a dark area. Replace any light bulbs as soon as they burn out.  Set up your furniture so you have a clear path. Avoid moving your furniture around.  If any of your floors are uneven, fix them.  If there are any pets around you, be aware of where they are.  Review your medicines with your doctor. Some medicines can make you feel dizzy. This can increase your chance of falling. Ask your doctor what other things that you can do to help prevent falls. This information is not intended to replace advice given to you by your health care provider. Make sure you discuss any questions you have with your health care provider. Document Released: 08/31/2009 Document Revised: 04/11/2016 Document Reviewed: 12/09/2014 Elsevier Interactive Patient Education  2017 [employer].

## 2018-07-13 ENCOUNTER — Telehealth: Payer: Self-pay | Admitting: Family Medicine

## 2018-07-13 ENCOUNTER — Encounter: Payer: Self-pay | Admitting: Family Medicine

## 2018-07-13 ENCOUNTER — Other Ambulatory Visit: Payer: Self-pay | Admitting: Family Medicine

## 2018-07-13 DIAGNOSIS — Z09 Encounter for follow-up examination after completed treatment for conditions other than malignant neoplasm: Secondary | ICD-10-CM | POA: Insufficient documentation

## 2018-07-13 DIAGNOSIS — D539 Nutritional anemia, unspecified: Secondary | ICD-10-CM

## 2018-07-13 NOTE — Assessment & Plan Note (Signed)
Hopsital course including results are reviewed with the patient Since she has had a complete cardiology evaluation in the hospital which  Is negative for CAD, I did advise she may cancel the close cardiology follow up She needs sleep study and is referred  Needs f/u labs re anemia and I will contact her to have this done

## 2018-07-13 NOTE — Assessment & Plan Note (Signed)
Ms. Gassett is reminded of the importance of commitment to daily physical activity for 30 minutes or more, as able and the need to limit carbohydrate intake to 30 to 60 grams per meal to help with blood sugar control.   Diet controlled with deterioration , also has obesity and hyperlipidemia , refer for diabetic ed.   Ms. Guettler is reminded of the importance of daily foot exam, annual eye examination, and good blood sugar, blood pressure and cholesterol control.  Diabetic Labs Latest Ref Rng & Units 06/23/2018 06/22/2018 06/08/2018 05/18/2018 03/17/2018  HbA1c 4.8 - 5.6 % 6.7(H) - - - 6.2(H)  Microalbumin Not estab mg/dL - - - - -  Micro/Creat Ratio <30 mcg/mg creat - - - - -  Chol 0 - 200 mg/dL 178 - - - 157  HDL >40 mg/dL 41 - - - 53  Calc LDL 0 - 99 mg/dL 84 - - - 86  Triglycerides <150 mg/dL 265(H) - - - 85  Creatinine 0.44 - 1.00 mg/dL 0.79 0.95 0.76 0.86 0.74   BP/Weight 07/06/2018 06/25/2018 06/24/2018 06/22/2018 06/22/2018 06/22/2018 6/86/1683  Systolic BP 729 021 115 - 520 - 802  Diastolic BP 83 80 53 - 68 - 82  Wt. (Lbs) 215 218 217.59 - 217.12 - 217  BMI 31.75 32.19 - 32.13 - 32.06 32.05   Foot/eye exam completion dates Latest Ref Rng & Units 05/20/2018 01/20/2018  Eye Exam No Retinopathy - No Retinopathy  Foot Form Completion - Done -

## 2018-07-13 NOTE — Assessment & Plan Note (Signed)
rept cbc , chem 7 , will need anemia panel also

## 2018-07-13 NOTE — Progress Notes (Signed)
Sophia [name]     MRN: [id number]      DOB: 30-Sep-1951   HPI [name] is here for follow up of hospitalization from 8/5 to 06/24/2018 for evaluation for chest pain She had a negative cardiac workup which is good , she has been extremely  concerned and symptomatic in the past several weeks A sleep study is recommended as well as the need to rept CBc as she was anemic mildly at admission C/o still experiencing fatigue and poor exercise tolerance but is reassured that she has no c/p disease and will continue to work on getting back to her old self, has upcoming cardiology appt Does c/o increased stress , and anxiety in the past approx 2 months, watching marked deterioration in the health of 2 younger sisters A sleep study is recommended and  she is referred for this. Her hospital course is reviewed in detail with her and all questions are answered  ROS Denies recent fever or chills. Denies sinus pressure, nasal congestion, ear pain or sore throat. Denies chest congestion, productive cough or wheezing.  Denies abdominal pain, nausea, vomiting,diarrhea or constipation.   Denies dysuria, frequency, hesitancy or incontinence. Denies uncontrolled  joint pain, swelling and limitation in mobility. Denies headaches, seizures, numbness, or tingling. Denies depression,c/o increased anxiety denies  insomnia. Denies skin break down or rash.   PE  BP 120/80   Pulse 73   Resp 16   Ht 5\' 9"  (1.753 m)   Wt 218 lb (98.9 kg)   SpO2 94%   BMI 32.19 kg/m   Patient alert and oriented and in no cardiopulmonary distress.  HEENT: No facial asymmetry, EOMI,   oropharynx pink and moist.  Neck supple no JVD, no mass.  Chest: Clear to auscultation bilaterally.  CVS: S1, S2 no murmurs, no S3.Regular rate.  ABD: Soft non tender.   Ext: No edema  MS: Adequate though reduced  ROM spine, shoulders, hips and knees.  Skin: Intact, no ulcerations or rash noted.  Psych: Good eye contact, normal affect.  Memory intact not anxious or depressed appearing.  CNS: CN 2-12 intact, power,  normal throughout.no focal deficits noted.   [facility] discharge follow-up Hopsital course including results are reviewed with the patient Since she has had a complete cardiology evaluation in the hospital which  Is negative for CAD, I did advise she may cancel the close cardiology follow up She needs sleep study and is referred  Needs f/u labs re anemia and I will contact her to have this done  Diabetes mellitus type 2 in obese [facility]) Sophia [name] is reminded of the importance of commitment to daily physical activity for 30 minutes or more, as able and the need to limit carbohydrate intake to 30 to 60 grams per meal to help with blood sugar control.   Diet controlled with deterioration , also has obesity and hyperlipidemia , refer for diabetic ed.   Sophia [name] is reminded of the importance of daily foot exam, annual eye examination, and good blood sugar, blood pressure and cholesterol control.  Diabetic Labs Latest Ref Rng & Units 06/23/2018 06/22/2018 06/08/2018 05/18/2018 03/17/2018  HbA1c 4.8 - 5.6 % 6.7(H) - - - 6.2(H)  Microalbumin Not estab mg/dL - - - - -  Micro/Creat Ratio <30 mcg/mg creat - - - - -  Chol 0 - 200 mg/dL 178 - - - 157  HDL >40 mg/dL 41 - - - 53  Calc LDL 0 - 99  mg/dL 84 - - - 86  Triglycerides <150 mg/dL 265(H) - - - 85  Creatinine 0.44 - 1.00 mg/dL 0.79 0.95 0.76 0.86 0.74   BP/Weight 07/06/2018 06/25/2018 06/24/2018 06/22/2018 06/22/2018 06/22/2018 1/65/7903  Systolic BP 833 383 291 - 916 - 606  Diastolic BP 83 80 53 - 68 - 82  Wt. (Lbs) 215 218 217.59 - 217.12 - 217  BMI 31.75 32.19 - 32.13 - 32.06 32.05   Foot/eye exam completion dates Latest Ref Rng & Units 05/20/2018 01/20/2018  Eye Exam No Retinopathy - No Retinopathy  Foot Form Completion - Done -        Anemia rept cbc , chem 7 , will need anemia panel also

## 2018-07-13 NOTE — Telephone Encounter (Signed)
Patient needs CBC , chem 7 and EGFr non fasting and iron, ferritin and B12 levels and folate as follow up from hospital stay , she was mildly anemic.Pls order and ask her to get this either Tuesday through Thursday of this week or early next week please Also needs referral to diabetic educator re hyperlipidemia , obesity, type 2 diabetes diet controlled, if not done a last visit ( could not see )   Thanks

## 2018-07-15 DIAGNOSIS — G4733 Obstructive sleep apnea (adult) (pediatric): Secondary | ICD-10-CM | POA: Diagnosis not present

## 2018-07-15 DIAGNOSIS — G4701 Insomnia due to medical condition: Secondary | ICD-10-CM | POA: Diagnosis not present

## 2018-07-15 DIAGNOSIS — M5416 Radiculopathy, lumbar region: Secondary | ICD-10-CM | POA: Diagnosis not present

## 2018-07-15 DIAGNOSIS — I10 Essential (primary) hypertension: Secondary | ICD-10-CM | POA: Diagnosis not present

## 2018-07-15 NOTE — Telephone Encounter (Signed)
Pt aware.

## 2018-07-15 NOTE — Addendum Note (Signed)
Addended by: Eual Fines on: 07/15/2018 02:39 PM   Modules accepted: Orders

## 2018-07-16 ENCOUNTER — Ambulatory Visit: Payer: Medicare PPO | Admitting: Physician Assistant

## 2018-07-16 DIAGNOSIS — D539 Nutritional anemia, unspecified: Secondary | ICD-10-CM | POA: Diagnosis not present

## 2018-07-17 LAB — CBC
HCT: 37.2 % (ref 35.0–45.0)
Hemoglobin: 11.8 g/dL (ref 11.7–15.5)
MCH: 25 pg — ABNORMAL LOW (ref 27.0–33.0)
MCHC: 31.7 g/dL — ABNORMAL LOW (ref 32.0–36.0)
MCV: 78.8 fL — ABNORMAL LOW (ref 80.0–100.0)
MPV: 11.7 fL (ref 7.5–12.5)
Platelets: 238 10*3/uL (ref 140–400)
RBC: 4.72 10*6/uL (ref 3.80–5.10)
RDW: 14.9 % (ref 11.0–15.0)
WBC: 5.8 10*3/uL (ref 3.8–10.8)

## 2018-07-17 LAB — B12 AND FOLATE PANEL
Folate: >24 ng/mL
Vitamin B-12: 1050 pg/mL (ref 200–1100)

## 2018-07-17 LAB — FERRITIN: Ferritin: 174 ng/mL (ref 16–288)

## 2018-07-17 LAB — IRON: Iron: 57 ug/dL (ref 45–160)

## 2018-07-17 LAB — BASIC METABOLIC PANEL WITH GFR
BUN / CREAT RATIO: 20 (calc) (ref 6–22)
BUN: 20 mg/dL (ref 7–25)
CHLORIDE: 103 mmol/L (ref 98–110)
CO2: 31 mmol/L (ref 20–32)
Calcium: 10.2 mg/dL (ref 8.6–10.4)
Creat: 1 mg/dL — ABNORMAL HIGH (ref 0.50–0.99)
GFR, Est African American: 67 mL/min/{1.73_m2} (ref >=60)
GFR, Est Non African American: 58 mL/min/{1.73_m2} — ABNORMAL LOW (ref >=60)
Glucose, Bld: 102 mg/dL (ref 65–139)
POTASSIUM: 4.8 mmol/L (ref 3.5–5.3)
SODIUM: 144 mmol/L (ref 135–146)

## 2018-07-28 ENCOUNTER — Ambulatory Visit: Payer: Medicare PPO | Admitting: Family Medicine

## 2018-08-03 ENCOUNTER — Other Ambulatory Visit (HOSPITAL_BASED_OUTPATIENT_CLINIC_OR_DEPARTMENT_OTHER): Payer: Self-pay

## 2018-08-03 DIAGNOSIS — G4733 Obstructive sleep apnea (adult) (pediatric): Secondary | ICD-10-CM

## 2018-08-08 ENCOUNTER — Other Ambulatory Visit: Payer: Self-pay | Admitting: Family Medicine

## 2018-08-10 ENCOUNTER — Ambulatory Visit: Payer: Medicare PPO | Attending: Neurology | Admitting: Neurology

## 2018-08-10 DIAGNOSIS — Z79899 Other long term (current) drug therapy: Secondary | ICD-10-CM | POA: Diagnosis not present

## 2018-08-10 DIAGNOSIS — G4733 Obstructive sleep apnea (adult) (pediatric): Secondary | ICD-10-CM | POA: Diagnosis not present

## 2018-08-10 DIAGNOSIS — Z7982 Long term (current) use of aspirin: Secondary | ICD-10-CM | POA: Insufficient documentation

## 2018-08-22 NOTE — Procedures (Signed)
[place] A. Merlene Laughter, MD     [email or web address]             NOCTURNAL POLYSOMNOGRAPHY   LOCATION: [facility]  Patient Name: Sophia [name], Sophia [name] Date: 08/10/2018 Gender: Female D.O.B: Feb 08, 1951 Age (years): 26 Referring Provider: Phillips Odor MD, ABSM Height (inches): 69 Interpreting Physician: Phillips Odor MD, ABSM Weight (lbs): 219 RPSGT: Rosebud Poles BMI: 32 MRN: [id number] Neck Size: 15.50 CLINICAL INFORMATION Sleep Study Type: NPSG     Indication for sleep study: N/A     Epworth Sleepiness Score: 8     SLEEP STUDY TECHNIQUE As per the AASM Manual for the Scoring of Sleep and Associated Events v2.3 (April 2016) with a hypopnea requiring 4% desaturations.  The channels recorded and monitored were frontal, central and occipital EEG, electrooculogram (EOG), submentalis EMG (chin), nasal and oral airflow, thoracic and abdominal wall motion, anterior tibialis EMG, snore microphone, electrocardiogram, and pulse oximetry.  MEDICATIONS Medications self-administered by patient taken the night of the study : N/A  Current Outpatient Medications:  .  acetaminophen (TYLENOL) 325 MG tablet, Take 325 mg by mouth every 6 (six) hours as needed for moderate pain., Disp: , Rfl:  .  ALPRAZolam (XANAX) 1 MG tablet, One tablet by mouth for extreme anxiety or panic attack (Patient taking differently: Take 1 mg by mouth daily as needed for anxiety or sleep. One tablet by mouth for extreme anxiety or panic attack), Disp: 10 tablet, Rfl: 0 .  amLODipine (NORVASC) 10 MG tablet, Take 1 tablet (10 mg total) by mouth daily., Disp: 90 tablet, Rfl: 3 .  aspirin EC 81 MG tablet, Take 81 mg by mouth daily., Disp: , Rfl:  .  benazepril-hydrochlorthiazide (LOTENSIN HCT) 20-12.5 MG tablet, Take 2 tablets by mouth daily., Disp: 180 tablet, Rfl: 1 .  Cholecalciferol (VITAMIN D3 PO), Take 2,000 Units by mouth daily., Disp: , Rfl:  .  Cyanocobalamin (VITAMIN B-12 PO),  Take 1,000 mcg by mouth daily., Disp: , Rfl:  .  FLUoxetine (PROZAC) 40 MG capsule, TAKE 1 CAPSULE EVERY DAY, Disp: 90 capsule, Rfl: 1 .  gabapentin (NEURONTIN) 100 MG capsule, Take 100 mg by mouth 3 (three) times daily., Disp: , Rfl:  .  loratadine (CLARITIN) 10 MG tablet, Take 1 tablet (10 mg total) by mouth daily as needed for allergies., Disp: 90 tablet, Rfl: 1 .  Multiple Vitamin (MULTIVITAMIN WITH MINERALS) TABS tablet, Take 1 tablet by mouth daily., Disp: , Rfl:  .  Omega-3 Fatty Acids (FISH OIL) 1000 MG CAPS, Take 1 capsule by mouth daily., Disp: , Rfl:  .  pantoprazole (PROTONIX) 40 MG tablet, TAKE 1 TABLET EVERY DAY, Disp: 90 tablet, Rfl: 1 .  pravastatin (PRAVACHOL) 40 MG tablet, TAKE 1 TABLET EVERY EVENING, Disp: 90 tablet, Rfl: 1 .  temazepam (RESTORIL) 15 MG capsule, TAKE 1 CAPSULE AT BEDTIME AS NEEDED FOR SLEEP, Disp: 90 capsule, Rfl: 1 .  UNABLE TO FIND, Take 5 mLs by mouth daily as needed (for energy). Black seed black/cumin seed oil. 1 tsp daily, Disp: , Rfl:      SLEEP ARCHITECTURE The study was initiated at 11:07:49 PM and ended at 5:46:09 AM.  Sleep onset time was 26.7 minutes and the sleep efficiency was 85.0%%. The total sleep time was 338.6 minutes.  Stage REM latency was 132.0 minutes.  The patient spent 5.3%% of the night in stage N1 sleep, 55.4%% in stage N2 sleep, 20.1%% in stage N3 and 19.2% in REM.  Alpha intrusion was absent.  Supine sleep was 44.30%.  RESPIRATORY PARAMETERS The overall apnea/hypopnea index (AHI) was 23.9 per hour. There were 7 total apneas, including 6 obstructive, 1 central and 0 mixed apneas. There were 128 hypopneas and 9 RERAs.  The AHI during Stage REM sleep was 58.2 per hour.  AHI while supine was 38.0 per hour.  The mean oxygen saturation was 90.7%. The minimum SpO2 during sleep was 76.0%.  loud snoring was noted during this study.  CARDIAC DATA The 2 lead EKG demonstrated sinus rhythm. The mean heart rate was 59.8 beats  per minute. Other EKG findings include: None. LEG MOVEMENT DATA The total PLMS were 0 with a resulting PLMS index of 0.0. Associated arousal with leg movement index was 0.0.  IMPRESSIONS Moderate obstructive sleep apnea is observed with study. A formal CPAP titration recording is suggested.    Delano Metz, MD Diplomate, American Board of Sleep Medicine.   ELECTRONICALLY SIGNED ON:  08/22/2018, 4:49 PM [place] PH: [phone]   FX: [phone] [place]

## 2018-08-26 ENCOUNTER — Encounter: Payer: Self-pay | Admitting: Family Medicine

## 2018-08-26 ENCOUNTER — Ambulatory Visit (INDEPENDENT_AMBULATORY_CARE_PROVIDER_SITE_OTHER): Payer: Medicare PPO | Admitting: Family Medicine

## 2018-08-26 VITALS — BP 136/82 | HR 82 | Resp 16 | Ht 69.0 in | Wt 219.1 lb

## 2018-08-26 DIAGNOSIS — I10 Essential (primary) hypertension: Secondary | ICD-10-CM

## 2018-08-26 DIAGNOSIS — F418 Other specified anxiety disorders: Secondary | ICD-10-CM | POA: Diagnosis not present

## 2018-08-26 DIAGNOSIS — E785 Hyperlipidemia, unspecified: Secondary | ICD-10-CM

## 2018-08-26 DIAGNOSIS — E669 Obesity, unspecified: Secondary | ICD-10-CM

## 2018-08-26 DIAGNOSIS — E1169 Type 2 diabetes mellitus with other specified complication: Secondary | ICD-10-CM

## 2018-08-26 MED ORDER — BENAZEPRIL-HYDROCHLOROTHIAZIDE 20-12.5 MG PO TABS: 2.0000 | ORAL_TABLET | Freq: Every day | ORAL | 1 refills | Status: DC

## 2018-08-26 MED ORDER — TEMAZEPAM 15 MG PO CAPS: ORAL_CAPSULE | ORAL | 1 refills | Status: DC

## 2018-08-26 NOTE — Progress Notes (Signed)
Sophia [name]     MRN: [id number]      DOB: [birth year]   HPI Sophia [name] is here for follow up and re-evaluation of chronic medical conditions, medication management and review of any available recent lab and radiology data.  Preventive health is updated, specifically  Cancer screening and Immunization.   Questions or concerns regarding consultations or procedures which the PT has had in the interim are  addressed. The PT denies any adverse reactions to current medications since the last visit.  There are no new concerns.  There are no specific complaints   ROS Denies recent fever or chills. Denies sinus pressure, nasal congestion, ear pain or sore throat. Denies chest congestion, productive cough or wheezing. Denies chest pains, palpitations and leg swelling Denies abdominal pain, nausea, vomiting,diarrhea or constipation.   Denies dysuria, frequency, hesitancy or incontinence. Denies joint pain, swelling and limitation in mobility. Denies headaches, seizures, numbness, or tingling. Denies depression, anxiety or insomnia. Denies skin break down or rash.   PE  BP 140/84   Pulse 82   Resp 16   Ht 5\' 9"  (1.753 m)   Wt 219 lb 1.9 oz (99.4 kg)   SpO2 97%   BMI 32.36 kg/m   Patient alert and oriented and in no cardiopulmonary distress.  HEENT: No facial asymmetry, EOMI,   oropharynx pink and moist.  Neck supple no JVD, no mass.  Chest: Clear to auscultation bilaterally.  CVS: S1, S2 no murmurs, no S3.Regular rate.  ABD: Soft non tender.   Ext: No edema  MS: Adequate ROM spine, shoulders, hips and knees.  Skin: Intact, no ulcerations or rash noted.  Psych: Good eye contact, normal affect. Memory intact not anxious or depressed appearing.  CNS: CN 2-12 intact, power,  normal throughout.no focal deficits noted.   Assessment & Plan  Depression with anxiety Controlled, no change in medication   Essential hypertension Controlled, no change in medication DASH diet  and commitment to daily physical activity for a minimum of 30 minutes discussed and encouraged, as a part of hypertension management. The importance of attaining a healthy weight is also discussed.  BP/Weight 10/08/2018 08/26/2018 07/06/2018 06/25/2018 06/24/2018 8/0/9983 01/24/2504  Systolic BP 397 673 419 379 024 - 097  Diastolic BP 80 82 83 80 53 - 68  Wt. (Lbs) 218 219.12 215 218 217.59 - 217.12  BMI 32.19 32.36 31.75 32.19 - 32.13 -       Obesity (BMI 30-39.9) Unchanged Patient re-educated about  the importance of commitment to a  minimum of 150 minutes of exercise per week.  The importance of healthy food choices with portion control discussed. Encouraged to start a food diary, count calories and to consider  joining a support group. Sample diet sheets offered. Goals set by the patient for the next several months.   Weight /BMI 10/08/2018 08/26/2018 07/06/2018  WEIGHT 218 lb 219 lb 1.9 oz 215 lb  HEIGHT 5\' 9"  5\' 9"  5\' 9"   BMI 32.19 kg/m2 32.36 kg/m2 31.75 kg/m2      Diabetes mellitus type 2 in obese [facility]) Sophia [name] is reminded of the importance of commitment to daily physical activity for 30 minutes or more, as able and the need to limit carbohydrate intake to 30 to 60 grams per meal to help with blood sugar control.   Sophia [name] is reminded of the importance of daily foot exam, annual eye examination, and good blood sugar, blood pressure and cholesterol control. No change in management  Diabetic Labs Latest Ref Rng & Units 07/16/2018 06/23/2018 06/22/2018 06/08/2018 05/18/2018  HbA1c 4.8 - 5.6 % - 6.7(H) - - -  Microalbumin Not estab mg/dL - - - - -  Micro/Creat Ratio <30 mcg/mg creat - - - - -  Chol 0 - 200 mg/dL - 178 - - -  HDL >40 mg/dL - 41 - - -  Calc LDL 0 - 99 mg/dL - 84 - - -  Triglycerides <150 mg/dL - 265(H) - - -  Creatinine 0.50 - 0.99 mg/dL 1.00(H) 0.79 0.95 0.76 0.86   BP/Weight 10/08/2018 08/26/2018 07/06/2018 06/25/2018 06/24/2018 04/23/8158 02/23/760  Systolic BP 518 343  735 789 784 - 784  Diastolic BP 80 82 83 80 53 - 68  Wt. (Lbs) 218 219.12 215 218 217.59 - 217.12  BMI 32.19 32.36 31.75 32.19 - 32.13 -   Foot/eye exam completion dates Latest Ref Rng & Units 05/20/2018 01/20/2018  Eye Exam No Retinopathy - No Retinopathy  Foot Form Completion - Done -

## 2018-08-26 NOTE — Patient Instructions (Addendum)
F/U in mid January, call if you need me before  Please stop sodas, this will improve blood pressure , weight and blood sugar  Fasting lipid, cmp and EGFr, HBA1C 1 week before visit    It is important that you exercise regularly at least 30 minutes 5 times a week. If you develop chest pain, have severe difficulty breathing, or feel very tired, stop exercising immediately and seek medical attention    You do not need to refill fish oil or B12, continue vit D and multivitamin    

## 2018-09-14 DIAGNOSIS — M5416 Radiculopathy, lumbar region: Secondary | ICD-10-CM | POA: Diagnosis not present

## 2018-09-14 DIAGNOSIS — I10 Essential (primary) hypertension: Secondary | ICD-10-CM | POA: Diagnosis not present

## 2018-09-14 DIAGNOSIS — G4701 Insomnia due to medical condition: Secondary | ICD-10-CM | POA: Diagnosis not present

## 2018-09-14 DIAGNOSIS — G4733 Obstructive sleep apnea (adult) (pediatric): Secondary | ICD-10-CM | POA: Diagnosis not present

## 2018-10-07 ENCOUNTER — Other Ambulatory Visit (HOSPITAL_BASED_OUTPATIENT_CLINIC_OR_DEPARTMENT_OTHER): Payer: Self-pay

## 2018-10-07 DIAGNOSIS — G4733 Obstructive sleep apnea (adult) (pediatric): Secondary | ICD-10-CM

## 2018-10-08 ENCOUNTER — Encounter: Payer: Self-pay | Admitting: Family Medicine

## 2018-10-08 ENCOUNTER — Telehealth: Payer: Self-pay

## 2018-10-08 ENCOUNTER — Ambulatory Visit (INDEPENDENT_AMBULATORY_CARE_PROVIDER_SITE_OTHER): Payer: Medicare PPO | Admitting: Family Medicine

## 2018-10-08 VITALS — BP 136/80 | HR 71 | Resp 15 | Ht 69.0 in | Wt 218.0 lb

## 2018-10-08 DIAGNOSIS — I10 Essential (primary) hypertension: Secondary | ICD-10-CM | POA: Diagnosis not present

## 2018-10-08 DIAGNOSIS — L98491 Non-pressure chronic ulcer of skin of other sites limited to breakdown of skin: Secondary | ICD-10-CM

## 2018-10-08 DIAGNOSIS — L98499 Non-pressure chronic ulcer of skin of other sites with unspecified severity: Secondary | ICD-10-CM | POA: Insufficient documentation

## 2018-10-08 MED ORDER — DOXYCYCLINE HYCLATE 100 MG PO TABS: 100.0000 mg | ORAL_TABLET | Freq: Two times a day (BID) | ORAL | 0 refills | Status: DC

## 2018-10-08 MED ORDER — FLUCONAZOLE 150 MG PO TABS: ORAL_TABLET | ORAL | 1 refills | Status: DC

## 2018-10-08 NOTE — Assessment & Plan Note (Addendum)
Diameter approx 4 cm course of doxycyline prescribed, cleaned with saline and dry gauze loosely applied. Pt to call if does not heal fully or worsens
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
## 2018-10-08 NOTE — Telephone Encounter (Signed)
Pt seen in office today.

## 2018-10-08 NOTE — Telephone Encounter (Signed)
Pt in office today with her sister to her appt and she has a large irritated area under her right breast x 3 days. Doesn't itch or drain but she has been keeping it covered until she knows what you recommend she do to help it. Please advise

## 2018-10-11 ENCOUNTER — Ambulatory Visit: Payer: Medicare PPO | Attending: Neurology | Admitting: Neurology

## 2018-10-11 DIAGNOSIS — G4733 Obstructive sleep apnea (adult) (pediatric): Secondary | ICD-10-CM | POA: Insufficient documentation

## 2018-10-11 DIAGNOSIS — Z7982 Long term (current) use of aspirin: Secondary | ICD-10-CM | POA: Insufficient documentation

## 2018-10-11 DIAGNOSIS — Z79899 Other long term (current) drug therapy: Secondary | ICD-10-CM | POA: Insufficient documentation

## 2018-10-14 ENCOUNTER — Encounter: Payer: Self-pay | Admitting: Family Medicine

## 2018-10-14 NOTE — Assessment & Plan Note (Signed)
Unchanged Patient re-educated about  the importance of commitment to a  minimum of 150 minutes of exercise per week.  The importance of healthy food choices with portion control discussed. Encouraged to start a food diary, count calories and to consider  joining a support group. Sample diet sheets offered. Goals set by the patient for the next several months.   Weight /BMI 10/08/2018 08/26/2018 07/06/2018  WEIGHT 218 lb 219 lb 1.9 oz 215 lb  HEIGHT 5\' 9"  5\' 9"  5\' 9"   BMI 32.19 kg/m2 32.36 kg/m2 31.75 kg/m2

## 2018-10-14 NOTE — Assessment & Plan Note (Signed)
Controlled, no change in medication DASH diet and commitment to daily physical activity for a minimum of 30 minutes discussed and encouraged, as a part of hypertension management. The importance of attaining a healthy weight is also discussed.  BP/Weight 10/08/2018 08/26/2018 07/06/2018 06/25/2018 06/24/2018 07/21/7901 4/0/9735  Systolic BP 329 924 268 341 962 - 229  Diastolic BP 80 82 83 80 53 - 68  Wt. (Lbs) 218 219.12 215 218 217.59 - 217.12  BMI 32.19 32.36 31.75 32.19 - 32.13 -

## 2018-10-14 NOTE — Assessment & Plan Note (Signed)
Controlled, no change in medication  

## 2018-10-14 NOTE — Assessment & Plan Note (Signed)
Sophia [name] is reminded of the importance of commitment to daily physical activity for 30 minutes or more, as able and the need to limit carbohydrate intake to 30 to 60 grams per meal to help with blood sugar control.   Sophia [name] is reminded of the importance of daily foot exam, annual eye examination, and good blood sugar, blood pressure and cholesterol control. No change in management  Diabetic Labs Latest Ref Rng & Units 07/16/2018 06/23/2018 06/22/2018 06/08/2018 05/18/2018  HbA1c 4.8 - 5.6 % - 6.7(H) - - -  Microalbumin Not estab mg/dL - - - - -  Micro/Creat Ratio <30 mcg/mg creat - - - - -  Chol 0 - 200 mg/dL - 178 - - -  HDL >40 mg/dL - 41 - - -  Calc LDL 0 - 99 mg/dL - 84 - - -  Triglycerides <150 mg/dL - 265(H) - - -  Creatinine 0.50 - 0.99 mg/dL 1.00(H) 0.79 0.95 0.76 0.86   BP/Weight 10/08/2018 08/26/2018 07/06/2018 06/25/2018 06/24/2018 0/07/8118 11/22/7827  Systolic BP 562 130 865 784 696 - 295  Diastolic BP 80 82 83 80 53 - 68  Wt. (Lbs) 218 219.12 215 218 217.59 - 217.12  BMI 32.19 32.36 31.75 32.19 - 32.13 -   Foot/eye exam completion dates Latest Ref Rng & Units 05/20/2018 01/20/2018  Eye Exam No Retinopathy - No Retinopathy  Foot Form Completion - Done -

## 2018-10-18 ENCOUNTER — Encounter: Payer: Self-pay | Admitting: Family Medicine
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
## 2018-10-18 NOTE — Assessment & Plan Note (Signed)
Controlled, no change in medication DASH diet and commitment to daily physical activity for a minimum of 30 minutes discussed and encouraged, as a part of hypertension management. The importance of attaining a healthy weight is also discussed.  BP/Weight 10/08/2018 08/26/2018 07/06/2018 06/25/2018 06/24/2018 11/23/6061 0/11/6008  Systolic BP 932 355 732 202 542 - 706  Diastolic BP 80 82 83 80 53 - 68  Wt. (Lbs) 218 219.12 215 218 217.59 - 217.12  BMI 32.19 32.36 31.75 32.19 - 32.13 -

## 2018-10-25 NOTE — Procedures (Signed)
Gaines A. Merlene Laughter, MD     [email or web address]             NOCTURNAL POLYSOMNOGRAPHY   LOCATION: [facility]  Patient Name: Sophia [name], Sophia [name] Date: 10/11/2018 Gender: Female D.O.B: December 22, 1950 Age (years): 4 Referring Provider: Phillips Odor MD, ABSM Height (inches): 69 Interpreting Physician: Phillips Odor MD, ABSM Weight (lbs): 218 RPSGT: Rosebud Poles BMI: 32 MRN: [id number] Neck Size: 15.50 CLINICAL INFORMATION The patient is referred for a CPAP titration to treat sleep apnea.  Date of NPSG, Split Night or HST:  SLEEP STUDY TECHNIQUE As per the AASM Manual for the Scoring of Sleep and Associated Events v2.3 (April 2016) with a hypopnea requiring 4% desaturations.  The channels recorded and monitored were frontal, central and occipital EEG, electrooculogram (EOG), submentalis EMG (chin), nasal and oral airflow, thoracic and abdominal wall motion, anterior tibialis EMG, snore microphone, electrocardiogram, and pulse oximetry. Continuous positive airway pressure (CPAP) was initiated at the beginning of the study and titrated to treat sleep-disordered breathing.  MEDICATIONS Medications self-administered by patient taken the night of the study : N/A  Current Outpatient Medications:  .  acetaminophen (TYLENOL) 325 MG tablet, Take 325 mg by mouth every 6 (six) hours as needed for moderate pain., Disp: , Rfl:  .  ALPRAZolam (XANAX) 1 MG tablet, One tablet by mouth for extreme anxiety or panic attack (Patient taking differently: Take 1 mg by mouth daily as needed for anxiety or sleep. One tablet by mouth for extreme anxiety or panic attack), Disp: 10 tablet, Rfl: 0 .  amLODipine (NORVASC) 10 MG tablet, Take 1 tablet (10 mg total) by mouth daily., Disp: 90 tablet, Rfl: 3 .  aspirin EC 81 MG tablet, Take 81 mg by mouth daily., Disp: , Rfl:  .  benazepril-hydrochlorthiazide (LOTENSIN HCT) 20-12.5 MG tablet, Take 2 tablets by mouth daily., Disp: 180 tablet,  Rfl: 1 .  Cholecalciferol (VITAMIN D3 PO), Take 2,000 Units by mouth daily., Disp: , Rfl:  .  doxycycline (VIBRA-TABS) 100 MG tablet, Take 1 tablet (100 mg total) by mouth 2 (two) times daily., Disp: 20 tablet, Rfl: 0 .  fluconazole (DIFLUCAN) 150 MG tablet, One tablet once daily for vaginal itch after antibiotic, Disp: 1 tablet, Rfl: 1 .  FLUoxetine (PROZAC) 40 MG capsule, TAKE 1 CAPSULE EVERY DAY, Disp: 90 capsule, Rfl: 1 .  gabapentin (NEURONTIN) 100 MG capsule, Take 100 mg by mouth 3 (three) times daily., Disp: , Rfl:  .  loratadine (CLARITIN) 10 MG tablet, Take 1 tablet (10 mg total) by mouth daily as needed for allergies., Disp: 90 tablet, Rfl: 1 .  Multiple Vitamin (MULTIVITAMIN WITH MINERALS) TABS tablet, Take 1 tablet by mouth daily., Disp: , Rfl:  .  pantoprazole (PROTONIX) 40 MG tablet, TAKE 1 TABLET EVERY DAY, Disp: 90 tablet, Rfl: 1 .  pravastatin (PRAVACHOL) 40 MG tablet, TAKE 1 TABLET EVERY EVENING, Disp: 90 tablet, Rfl: 1 .  temazepam (RESTORIL) 15 MG capsule, TAKE 1 CAPSULE AT BEDTIME AS NEEDED FOR SLEEP, Disp: 90 capsule, Rfl: 1 .  UNABLE TO FIND, Take 5 mLs by mouth daily as needed (for energy). Black seed black/cumin seed oil. 1 tsp daily, Disp: , Rfl:    TECHNICIAN COMMENTS Comments added by technician: Patient talked in his/her sleep. Patient tolerated CPAP very well. CPAP therapy started at 4 cm of H2O and increased to 8 cm of H2O due to events noticed in REM stages and snoring episodes throughout study. Central-like events were noticed in REM stages Comments  added by scorer: N/A RESPIRATORY PARAMETERS Optimal PAP Pressure (cm): 7 AHI at Optimal Pressure (/hr): 0.0 Overall Minimal O2 (%): 83.0 Supine % at Optimal Pressure (%): 41 Minimal O2 at Optimal Pressure (%): 85.0   SLEEP ARCHITECTURE The study was initiated at 10:49:56 PM and ended at 5:20:02 AM.  Sleep onset time was 23.7 minutes and the sleep efficiency was 89.8%%. The total sleep time was 350.4 minutes.  The  patient spent 1.9%% of the night in stage N1 sleep, 44.2%% in stage N2 sleep, 29.3%% in stage N3 and 24.7% in REM.Stage REM latency was 172.0 minutes  Wake after sleep onset was 16.0. Alpha intrusion was absent. Supine sleep was 50.05%.  CARDIAC DATA The 2 lead EKG demonstrated sinus rhythm. The mean heart rate was 57.9 beats per minute. Other EKG findings include: None. LEG MOVEMENT DATA The total Periodic Limb Movements of Sleep (PLMS) were 0. The PLMS index was 0.0. A PLMS index of <15 is considered normal in adults.  IMPRESSIONS The optimal CPAP is 7 cm of water.   Delano Metz, MD Diplomate, American Board of Sleep Medicine. ELECTRONICALLY SIGNED ON:  10/25/2018, 7:42 PM [place] PH: [phone]   FX: [phone] [place]

## 2018-10-26 DIAGNOSIS — G4733 Obstructive sleep apnea (adult) (pediatric): Secondary | ICD-10-CM | POA: Diagnosis not present

## 2018-10-26 DIAGNOSIS — I10 Essential (primary) hypertension: Secondary | ICD-10-CM | POA: Diagnosis not present

## 2018-10-26 DIAGNOSIS — M5416 Radiculopathy, lumbar region: Secondary | ICD-10-CM | POA: Diagnosis not present

## 2018-10-26 DIAGNOSIS — G4701 Insomnia due to medical condition: Secondary | ICD-10-CM | POA: Diagnosis not present

## 2018-11-25 DIAGNOSIS — E785 Hyperlipidemia, unspecified: Secondary | ICD-10-CM | POA: Diagnosis not present

## 2018-11-25 DIAGNOSIS — E1169 Type 2 diabetes mellitus with other specified complication: Secondary | ICD-10-CM | POA: Diagnosis not present

## 2018-11-25 DIAGNOSIS — E669 Obesity, unspecified: Secondary | ICD-10-CM | POA: Diagnosis not present

## 2018-11-25 DIAGNOSIS — I10 Essential (primary) hypertension: Secondary | ICD-10-CM | POA: Diagnosis not present

## 2018-11-26 LAB — COMPLETE METABOLIC PANEL WITH GFR
AG RATIO: 1.5 (calc) (ref 1.0–2.5)
ALBUMIN MSPROF: 4.5 g/dL (ref 3.6–5.1)
ALT: 19 U/L (ref 6–29)
AST: 15 U/L (ref 10–35)
Alkaline phosphatase (APISO): 66 U/L (ref 33–130)
BILIRUBIN TOTAL: 0.3 mg/dL (ref 0.2–1.2)
BUN: 16 mg/dL (ref 7–25)
CHLORIDE: 101 mmol/L (ref 98–110)
CO2: 30 mmol/L (ref 20–32)
Calcium: 9.8 mg/dL (ref 8.6–10.4)
Creat: 0.87 mg/dL (ref 0.50–0.99)
GFR, EST AFRICAN AMERICAN: 79 mL/min/{1.73_m2} (ref >=60)
GFR, Est Non African American: 68 mL/min/{1.73_m2} (ref >=60)
GLUCOSE: 117 mg/dL — AB (ref 65–99)
Globulin: 3 g/dL (calc) (ref 1.9–3.7)
POTASSIUM: 3.8 mmol/L (ref 3.5–5.3)
SODIUM: 142 mmol/L (ref 135–146)
TOTAL PROTEIN: 7.5 g/dL (ref 6.1–8.1)

## 2018-11-26 LAB — HEMOGLOBIN A1C
EAG (MMOL/L): 7.7 (calc)
Hgb A1c MFr Bld: 6.5 % of total Hgb — ABNORMAL HIGH (ref <5.7)
MEAN PLASMA GLUCOSE: 140 (calc)

## 2018-11-26 LAB — LIPID PANEL
Cholesterol: 206 mg/dL — ABNORMAL HIGH (ref <200)
HDL: 51 mg/dL (ref >50)
LDL Cholesterol (Calc): 130 mg/dL (calc) — ABNORMAL HIGH
Non-HDL Cholesterol (Calc): 155 mg/dL (calc) — ABNORMAL HIGH (ref <130)
TRIGLYCERIDES: 141 mg/dL (ref <150)
Total CHOL/HDL Ratio: 4 (calc) (ref <5.0)

## 2018-12-02 ENCOUNTER — Ambulatory Visit: Payer: Medicare PPO | Admitting: Family Medicine

## 2018-12-02 ENCOUNTER — Encounter: Payer: Self-pay | Admitting: Family Medicine

## 2018-12-02 VITALS — BP 130/80 | HR 75 | Resp 15 | Ht 69.0 in | Wt 209.0 lb

## 2018-12-02 DIAGNOSIS — E785 Hyperlipidemia, unspecified: Secondary | ICD-10-CM | POA: Diagnosis not present

## 2018-12-02 DIAGNOSIS — F411 Generalized anxiety disorder: Secondary | ICD-10-CM | POA: Diagnosis not present

## 2018-12-02 DIAGNOSIS — I10 Essential (primary) hypertension: Secondary | ICD-10-CM | POA: Diagnosis not present

## 2018-12-02 DIAGNOSIS — F418 Other specified anxiety disorders: Secondary | ICD-10-CM

## 2018-12-02 DIAGNOSIS — E669 Obesity, unspecified: Secondary | ICD-10-CM

## 2018-12-02 DIAGNOSIS — E1169 Type 2 diabetes mellitus with other specified complication: Secondary | ICD-10-CM | POA: Diagnosis not present

## 2018-12-02 MED ORDER — PANTOPRAZOLE SODIUM 40 MG PO TBEC: 40.0000 mg | DELAYED_RELEASE_TABLET | Freq: Every day | ORAL | 1 refills | Status: DC

## 2018-12-02 MED ORDER — PRAVASTATIN SODIUM 40 MG PO TABS: 40.0000 mg | ORAL_TABLET | Freq: Every evening | ORAL | 1 refills | Status: DC

## 2018-12-02 MED ORDER — FLUOXETINE HCL 40 MG PO CAPS: 40.0000 mg | ORAL_CAPSULE | Freq: Every day | ORAL | 1 refills | Status: DC

## 2018-12-02 MED ORDER — ALPRAZOLAM 1 MG PO TABS: ORAL_TABLET | ORAL | 1 refills | Status: DC

## 2018-12-02 MED ORDER — GABAPENTIN 100 MG PO CAPS: 100.0000 mg | ORAL_CAPSULE | Freq: Three times a day (TID) | ORAL | 1 refills | Status: DC

## 2018-12-02 MED ORDER — BUSPIRONE HCL 5 MG PO TABS: 5.0000 mg | ORAL_TABLET | Freq: Three times a day (TID) | ORAL | 3 refills | Status: DC

## 2018-12-02 NOTE — Patient Instructions (Signed)
F/u in 8 to 10 weeks, call if you need me before   You need to speak with therapy In the office today, and are referred  New for anxiety is buspar one tablet three times daily  Limited alprazolam half to one tablet at most two tablets  Per week  My support with you as yopu deal with illness in your family

## 2018-12-03 ENCOUNTER — Encounter: Payer: Self-pay | Admitting: Family Medicine

## 2018-12-03 DIAGNOSIS — F411 Generalized anxiety disorder: Secondary | ICD-10-CM | POA: Insufficient documentation

## 2018-12-03 NOTE — Progress Notes (Signed)
Sophia [name]     MRN: [id number]      DOB: 15-Nov-1951   HPI Sophia [name] is here for follow up and re-evaluation of chronic medical conditions, medication management and review of any available recent lab and radiology data.  Preventive health is updated, specifically  Cancer screening and Immunization.   Questions or concerns regarding consultations or procedures which the PT has had in the interim are  addressed. The PT denies any adverse reactions to current medications since the last visit.  Tearful, anxious and depressed with new diagnosis of cancer in her brother, overwhelmed, not suicidal or homicidal, but poor level of function, requesting additional medication to cope. Very high GAD score, states has always been anxious, also depressed will  Get therapy. Still working on getting CPAP set up, she does have  Sleep apnea   ROS Denies recent fever or chills. Denies sinus pressure, nasal congestion, ear pain or sore throat. Denies chest congestion, productive cough or wheezing. Denies chest pains, palpitations and leg swelling Denies abdominal pain, nausea, vomiting,diarrhea or constipation.   Denies dysuria, frequency, hesitancy or incontinence. Denies joint pain, swelling and limitation in mobility. Denies headaches, seizures, numbness, or tingling. Denies skin break down or rash.   PE  BP 130/80   Pulse 75   Resp 15   Ht 5\' 9"  (1.753 m)   Wt 209 lb (94.8 kg)   SpO2 96%   BMI 30.86 kg/m   Patient alert and oriented and in no cardiopulmonary distress.  HEENT: No facial asymmetry, EOMI,   oropharynx pink and moist.  Neck supple no JVD, no mass.  Chest: Clear to auscultation bilaterally.  CVS: S1, S2 no murmurs, no S3.Regular rate.  ABD: Soft non tender.   Ext: No edema  MS: Adequate ROM spine, shoulders, hips and knees.  Skin: Intact, no ulcerations or rash noted.  Psych: Good eye contact,tearful, crying, anxious and depressed, not suicidal or homicidal CNS: CN  2-12 intact, power,  normal throughout.no focal deficits noted.   Assessment & Plan  GAD (generalized anxiety disorder) Start Buspar and prescribe limited supply of xanax, states needs this most when her brother is with her over nighting. Refer for tele psych in office  Depression with anxiety PHQ 9 score is good on high dose fluoxetine despite severe anxiety  Essential hypertension Controlled, no change in medication DASH diet and commitment to daily physical activity for a minimum of 30 minutes discussed and encouraged, as a part of hypertension management. The importance of attaining a healthy weight is also discussed.  BP/Weight 12/02/2018 10/08/2018 08/26/2018 07/06/2018 06/25/2018 07/26/3381 5/0/5397  Systolic BP 673 419 379 024 097 353 -  Diastolic BP 80 80 82 83 80 53 -  Wt. (Lbs) 209 218 219.12 215 218 217.59 -  BMI 30.86 32.19 32.36 31.75 32.19 - 32.13       Obesity (BMI 30-39.9) Improved, she is applauded on this Patient re-educated about  the importance of commitment to a  minimum of 150 minutes of exercise per week.  The importance of healthy food choices with portion control discussed. Encouraged to start a food diary, count calories and to consider  joining a support group. Sample diet sheets offered. Goals set by the patient for the next several months.   Weight /BMI 12/02/2018 10/08/2018 08/26/2018  WEIGHT 209 lb 218 lb 219 lb 1.9 oz  HEIGHT 5\' 9"  5\' 9"  5\' 9"   BMI 30.86 kg/m2 32.19 kg/m2 32.36 kg/m2  Diabetes mellitus type 2 in obese (HCC) Controlled, no change in medication Sophia [name] is reminded of the importance of commitment to daily physical activity for 30 minutes or more, as able and the need to limit carbohydrate intake to 30 to 60 grams per meal to help with blood sugar control.   Sophia [name] is reminded of the importance of daily foot exam, annual eye examination, and good blood sugar, blood pressure and cholesterol control.  Diabetic Labs Latest  Ref Rng & Units 11/25/2018 07/16/2018 06/23/2018 06/22/2018 06/08/2018  HbA1c <5.7 % of total Hgb 6.5(H) - 6.7(H) - -  Microalbumin Not estab mg/dL - - - - -  Micro/Creat Ratio <30 mcg/mg creat - - - - -  Chol <200 mg/dL 206(H) - 178 - -  HDL >50 mg/dL 51 - 41 - -  Calc LDL mg/dL (calc) 130(H) - 84 - -  Triglycerides <150 mg/dL 141 - 265(H) - -  Creatinine 0.50 - 0.99 mg/dL 0.87 1.00(H) 0.79 0.95 0.76   BP/Weight 12/02/2018 10/08/2018 08/26/2018 07/06/2018 06/25/2018 04/19/2243 07/25/5299  Systolic BP 511 021 117 356 701 410 -  Diastolic BP 80 80 82 83 80 53 -  Wt. (Lbs) 209 218 219.12 215 218 217.59 -  BMI 30.86 32.19 32.36 31.75 32.19 - 32.13   Foot/eye exam completion dates Latest Ref Rng & Units 05/20/2018 01/20/2018  Eye Exam No Retinopathy - No Retinopathy  Foot Form Completion - Done -        Hyperlipidemia with target LDL less than 100 Uncontrolled Hyperlipidemia:Low fat diet discussed and encouraged.   Lipid Panel  Lab Results  Component Value Date   CHOL 206 (H) 11/25/2018   HDL 51 11/25/2018   LDLCALC 130 (H) 11/25/2018   TRIG 141 11/25/2018   CHOLHDL 4.0 11/25/2018

## 2018-12-03 NOTE — Assessment & Plan Note (Signed)
Start Buspar and prescribe limited supply of xanax, states needs this most when her brother is with her over nighting. Refer for tele psych in office

## 2018-12-03 NOTE — Assessment & Plan Note (Signed)
Improved, she is applauded on this Patient re-educated about  the importance of commitment to a  minimum of 150 minutes of exercise per week.  The importance of healthy food choices with portion control discussed. Encouraged to start a food diary, count calories and to consider  joining a support group. Sample diet sheets offered. Goals set by the patient for the next several months.   Weight /BMI 12/02/2018 10/08/2018 08/26/2018  WEIGHT 209 lb 218 lb 219 lb 1.9 oz  HEIGHT 5\' 9"  5\' 9"  5\' 9"   BMI 30.86 kg/m2 32.19 kg/m2 32.36 kg/m2

## 2018-12-03 NOTE — Assessment & Plan Note (Signed)
Controlled, no change in medication DASH diet and commitment to daily physical activity for a minimum of 30 minutes discussed and encouraged, as a part of hypertension management. The importance of attaining a healthy weight is also discussed.  BP/Weight 12/02/2018 10/08/2018 08/26/2018 07/06/2018 06/25/2018 04/20/8936 01/20/2875  Systolic BP 811 572 620 355 974 163 -  Diastolic BP 80 80 82 83 80 53 -  Wt. (Lbs) 209 218 219.12 215 218 217.59 -  BMI 30.86 32.19 32.36 31.75 32.19 - 32.13

## 2018-12-03 NOTE — Assessment & Plan Note (Signed)
Uncontrolled Hyperlipidemia:Low fat diet discussed and encouraged.   Lipid Panel  Lab Results  Component Value Date   CHOL 206 (H) 11/25/2018   HDL 51 11/25/2018   LDLCALC 130 (H) 11/25/2018   TRIG 141 11/25/2018   CHOLHDL 4.0 11/25/2018

## 2018-12-03 NOTE — Assessment & Plan Note (Signed)
Controlled, no change in medication Sophia [name] is reminded of the importance of commitment to daily physical activity for 30 minutes or more, as able and the need to limit carbohydrate intake to 30 to 60 grams per meal to help with blood sugar control.   Sophia [name] is reminded of the importance of daily foot exam, annual eye examination, and good blood sugar, blood pressure and cholesterol control.  Diabetic Labs Latest Ref Rng & Units 11/25/2018 07/16/2018 06/23/2018 06/22/2018 06/08/2018  HbA1c <5.7 % of total Hgb 6.5(H) - 6.7(H) - -  Microalbumin Not estab mg/dL - - - - -  Micro/Creat Ratio <30 mcg/mg creat - - - - -  Chol <200 mg/dL 206(H) - 178 - -  HDL >50 mg/dL 51 - 41 - -  Calc LDL mg/dL (calc) 130(H) - 84 - -  Triglycerides <150 mg/dL 141 - 265(H) - -  Creatinine 0.50 - 0.99 mg/dL 0.87 1.00(H) 0.79 0.95 0.76   BP/Weight 12/02/2018 10/08/2018 08/26/2018 07/06/2018 06/25/2018 12/23/3662 4/0/3474  Systolic BP 259 563 875 643 329 518 -  Diastolic BP 80 80 82 83 80 53 -  Wt. (Lbs) 209 218 219.12 215 218 217.59 -  BMI 30.86 32.19 32.36 31.75 32.19 - 32.13   Foot/eye exam completion dates Latest Ref Rng & Units 05/20/2018 01/20/2018  Eye Exam No Retinopathy - No Retinopathy  Foot Form Completion - Done -

## 2018-12-03 NOTE — Assessment & Plan Note (Signed)
PHQ 9 score is good on high dose fluoxetine despite severe anxiety

## 2018-12-17 DIAGNOSIS — E669 Obesity, unspecified: Secondary | ICD-10-CM | POA: Diagnosis not present

## 2018-12-17 DIAGNOSIS — G4733 Obstructive sleep apnea (adult) (pediatric): Secondary | ICD-10-CM | POA: Diagnosis not present

## 2018-12-17 DIAGNOSIS — I10 Essential (primary) hypertension: Secondary | ICD-10-CM | POA: Diagnosis not present

## 2018-12-17 DIAGNOSIS — E119 Type 2 diabetes mellitus without complications: Secondary | ICD-10-CM | POA: Diagnosis not present

## 2018-12-17 DIAGNOSIS — E785 Hyperlipidemia, unspecified: Secondary | ICD-10-CM | POA: Diagnosis not present

## 2018-12-17 DIAGNOSIS — F419 Anxiety disorder, unspecified: Secondary | ICD-10-CM | POA: Diagnosis not present

## 2018-12-17 DIAGNOSIS — F330 Major depressive disorder, recurrent, mild: Secondary | ICD-10-CM | POA: Diagnosis not present

## 2018-12-17 DIAGNOSIS — K219 Gastro-esophageal reflux disease without esophagitis: Secondary | ICD-10-CM | POA: Diagnosis not present

## 2018-12-17 DIAGNOSIS — F17200 Nicotine dependence, unspecified, uncomplicated: Secondary | ICD-10-CM | POA: Diagnosis not present

## 2018-12-25 DIAGNOSIS — G4733 Obstructive sleep apnea (adult) (pediatric): Secondary | ICD-10-CM | POA: Diagnosis not present

## 2019-01-21 DIAGNOSIS — H524 Presbyopia: Secondary | ICD-10-CM | POA: Diagnosis not present

## 2019-01-21 DIAGNOSIS — Z961 Presence of intraocular lens: Secondary | ICD-10-CM | POA: Diagnosis not present

## 2019-01-21 DIAGNOSIS — E119 Type 2 diabetes mellitus without complications: Secondary | ICD-10-CM | POA: Diagnosis not present

## 2019-01-21 LAB — HM DIABETES EYE EXAM

## 2019-01-23 DIAGNOSIS — G4733 Obstructive sleep apnea (adult) (pediatric): Secondary | ICD-10-CM | POA: Diagnosis not present

## 2019-02-02 ENCOUNTER — Ambulatory Visit (INDEPENDENT_AMBULATORY_CARE_PROVIDER_SITE_OTHER): Payer: Medicare PPO | Admitting: Family Medicine

## 2019-02-02 ENCOUNTER — Other Ambulatory Visit: Payer: Self-pay

## 2019-02-02 ENCOUNTER — Encounter: Payer: Self-pay | Admitting: Family Medicine

## 2019-02-02 VITALS — BP 110/74 | HR 68 | Temp 98.6°F | Resp 15 | Ht 69.0 in | Wt 205.0 lb

## 2019-02-02 DIAGNOSIS — E1169 Type 2 diabetes mellitus with other specified complication: Secondary | ICD-10-CM | POA: Diagnosis not present

## 2019-02-02 DIAGNOSIS — E785 Hyperlipidemia, unspecified: Secondary | ICD-10-CM

## 2019-02-02 DIAGNOSIS — F418 Other specified anxiety disorders: Secondary | ICD-10-CM | POA: Diagnosis not present

## 2019-02-02 DIAGNOSIS — F411 Generalized anxiety disorder: Secondary | ICD-10-CM

## 2019-02-02 DIAGNOSIS — E669 Obesity, unspecified: Secondary | ICD-10-CM | POA: Diagnosis not present

## 2019-02-02 DIAGNOSIS — I10 Essential (primary) hypertension: Secondary | ICD-10-CM

## 2019-02-02 NOTE — Patient Instructions (Signed)
Physical exam with MD July 10 or after, call if you need me sooner  Thankful you  Feel much better  No more xanax  Fasting lipid, cmp and EGFr, CBC, HBA1C and TSH 1 week before next appt  It is important that you exercise regularly at least 30 minutes 5 times a week. If you develop chest pain, have severe difficulty breathing, or feel very tired, stop exercising immediately and seek medical attention    Think about what you will eat, plan ahead. Choose " clean, green, fresh or frozen" over canned, processed or packaged foods which are more sugary, salty and fatty. 70 to 75% of food eaten should be vegetables and fruit. Three meals at set times with snacks allowed between meals, but they must be fruit or vegetables. Aim to eat over a 12 hour period , example 7 am to 7 pm, and STOP after  your last meal of the day. Drink water,generally about 64 ounces per day, no other drink is as healthy. Fruit juice is best enjoyed in a healthy way, by EATING the fruit.

## 2019-02-06 ENCOUNTER — Encounter: Payer: Self-pay | Admitting: Family Medicine

## 2019-02-06 NOTE — Assessment & Plan Note (Signed)
Improved, she is applauded on this  Patient re-educated about  the importance of commitment to a  minimum of 150 minutes of exercise per week as able.  The importance of healthy food choices with portion control discussed, as well as eating regularly and within a 12 hour window most days. The need to choose "clean , green" food 50 to 75% of the time is discussed, as well as to make water the primary drink and set a goal of 64 ounces water daily.  Encouraged to start a food diary,  and to consider  joining a support group. Sample diet sheets offered. Goals set by the patient for the next several months.   Weight /BMI 02/02/2019 12/02/2018 10/08/2018  WEIGHT 205 lb 209 lb 218 lb  HEIGHT 5\' 9"  5\' 9"  5\' 9"   BMI 30.27 kg/m2 30.86 kg/m2 32.19 kg/m2

## 2019-02-06 NOTE — Assessment & Plan Note (Signed)
Controlled, no change in medication DASH diet and commitment to daily physical activity for a minimum of 30 minutes discussed and encouraged, as a part of hypertension management. The importance of attaining a healthy weight is also discussed.  BP/Weight 02/02/2019 12/02/2018 10/08/2018 08/26/2018 07/06/2018 11/23/6061 0/11/6008  Systolic BP 932 355 732 202 542 706 237  Diastolic BP 74 80 80 82 83 80 53  Wt. (Lbs) 205 209 218 219.12 215 218 217.59  BMI 30.27 30.86 32.19 32.36 31.75 32.19 -

## 2019-02-06 NOTE — Progress Notes (Signed)
   Sophia [name]     MRN: [id number]      DOB: 05/16/1951   HPI Sophia [name] is here for follow up and re-evaluation of chronic medical conditions, medication management and review of any available recent lab and radiology data.  Reports excellent response to anxiety medication ith no adverse s/e The PT denies any adverse reactions to current medications since the last visit.  There are no new concerns.   ROS Denies recent fever or chills. Denies sinus pressure, nasal congestion, ear pain or sore throat. Denies chest congestion, productive cough or wheezing. Denies chest pains, palpitations and leg swelling Denies abdominal pain, nausea, vomiting,diarrhea or constipation.   Denies dysuria, frequency, hesitancy or incontinence. Denies joint pain, swelling and limitation in mobility. Denies headaches, seizures, numbness, or tingling. Denies uncontrolled depression, anxiety or insomnia. Denies skin break down or rash.   PE  BP 110/74   Pulse 68   Temp 98.6 F (37 C) (Oral)   Resp 15   Ht 5\' 9"  (1.753 m)   Wt 205 lb (93 kg)   SpO2 98%   BMI 30.27 kg/m   Patient alert and oriented and in no cardiopulmonary distress.  HEENT: No facial asymmetry, EOMI,   oropharynx pink and moist.  Neck supple no JVD, no mass.  Chest: Clear to auscultation bilaterally.  CVS: S1, S2 no murmurs, no S3.Regular rate.  ABD: Soft non tender.   Ext: No edema  MS: Adequate ROM spine, shoulders, hips and knees.  Skin: Intact, no ulcerations or rash noted.  Psych: Good eye contact, normal affect. Memory intact not anxious or depressed appearing.  CNS: CN 2-12 intact, power,  normal throughout.no focal deficits noted.   Assessment & Plan  GAD (generalized anxiety disorder) Marked improvement and well controlled on low dose buspar, she will continue same  Depression with anxiety Controlled, no change in medication   Essential hypertension Controlled, no change in medication DASH diet and  commitment to daily physical activity for a minimum of 30 minutes discussed and encouraged, as a part of hypertension management. The importance of attaining a healthy weight is also discussed.  BP/Weight 02/02/2019 12/02/2018 10/08/2018 08/26/2018 07/06/2018 12/23/3662 4/0/3474  Systolic BP 259 563 875 643 329 518 841  Diastolic BP 74 80 80 82 83 80 53  Wt. (Lbs) 205 209 218 219.12 215 218 217.59  BMI 30.27 30.86 32.19 32.36 31.75 32.19 -       Obesity (BMI 30-39.9) Improved, she is applauded on this  Patient re-educated about  the importance of commitment to a  minimum of 150 minutes of exercise per week as able.  The importance of healthy food choices with portion control discussed, as well as eating regularly and within a 12 hour window most days. The need to choose "clean , green" food 50 to 75% of the time is discussed, as well as to make water the primary drink and set a goal of 64 ounces water daily.  Encouraged to start a food diary,  and to consider  joining a support group. Sample diet sheets offered. Goals set by the patient for the next several months.   Weight /BMI 02/02/2019 12/02/2018 10/08/2018  WEIGHT 205 lb 209 lb 218 lb  HEIGHT 5\' 9"  5\' 9"  5\' 9"   BMI 30.27 kg/m2 30.86 kg/m2 32.19 kg/m2

## 2019-02-06 NOTE — Assessment & Plan Note (Signed)
Controlled, no change in medication  

## 2019-02-06 NOTE — Assessment & Plan Note (Signed)
Marked improvement and well controlled on low dose buspar, she will continue same

## 2019-02-08 DIAGNOSIS — G4701 Insomnia due to medical condition: Secondary | ICD-10-CM | POA: Diagnosis not present

## 2019-02-08 DIAGNOSIS — M5416 Radiculopathy, lumbar region: Secondary | ICD-10-CM | POA: Diagnosis not present

## 2019-02-08 DIAGNOSIS — I10 Essential (primary) hypertension: Secondary | ICD-10-CM | POA: Diagnosis not present

## 2019-02-08 DIAGNOSIS — G4733 Obstructive sleep apnea (adult) (pediatric): Secondary | ICD-10-CM | POA: Diagnosis not present

## 2019-02-23 DIAGNOSIS — G4733 Obstructive sleep apnea (adult) (pediatric): Secondary | ICD-10-CM | POA: Diagnosis not present

## 2019-03-25 DIAGNOSIS — G4733 Obstructive sleep apnea (adult) (pediatric): Secondary | ICD-10-CM | POA: Diagnosis not present

## 2019-04-03 IMAGING — DX DG CHEST 2V
2 series · 2 of 2 positions shown · non-contrast
Comparison: 09/02/2014

CLINICAL DATA: Dyspnea and chest tightness for 6-7 days.

EXAM:
CHEST - 2 VIEW

[chest pa]
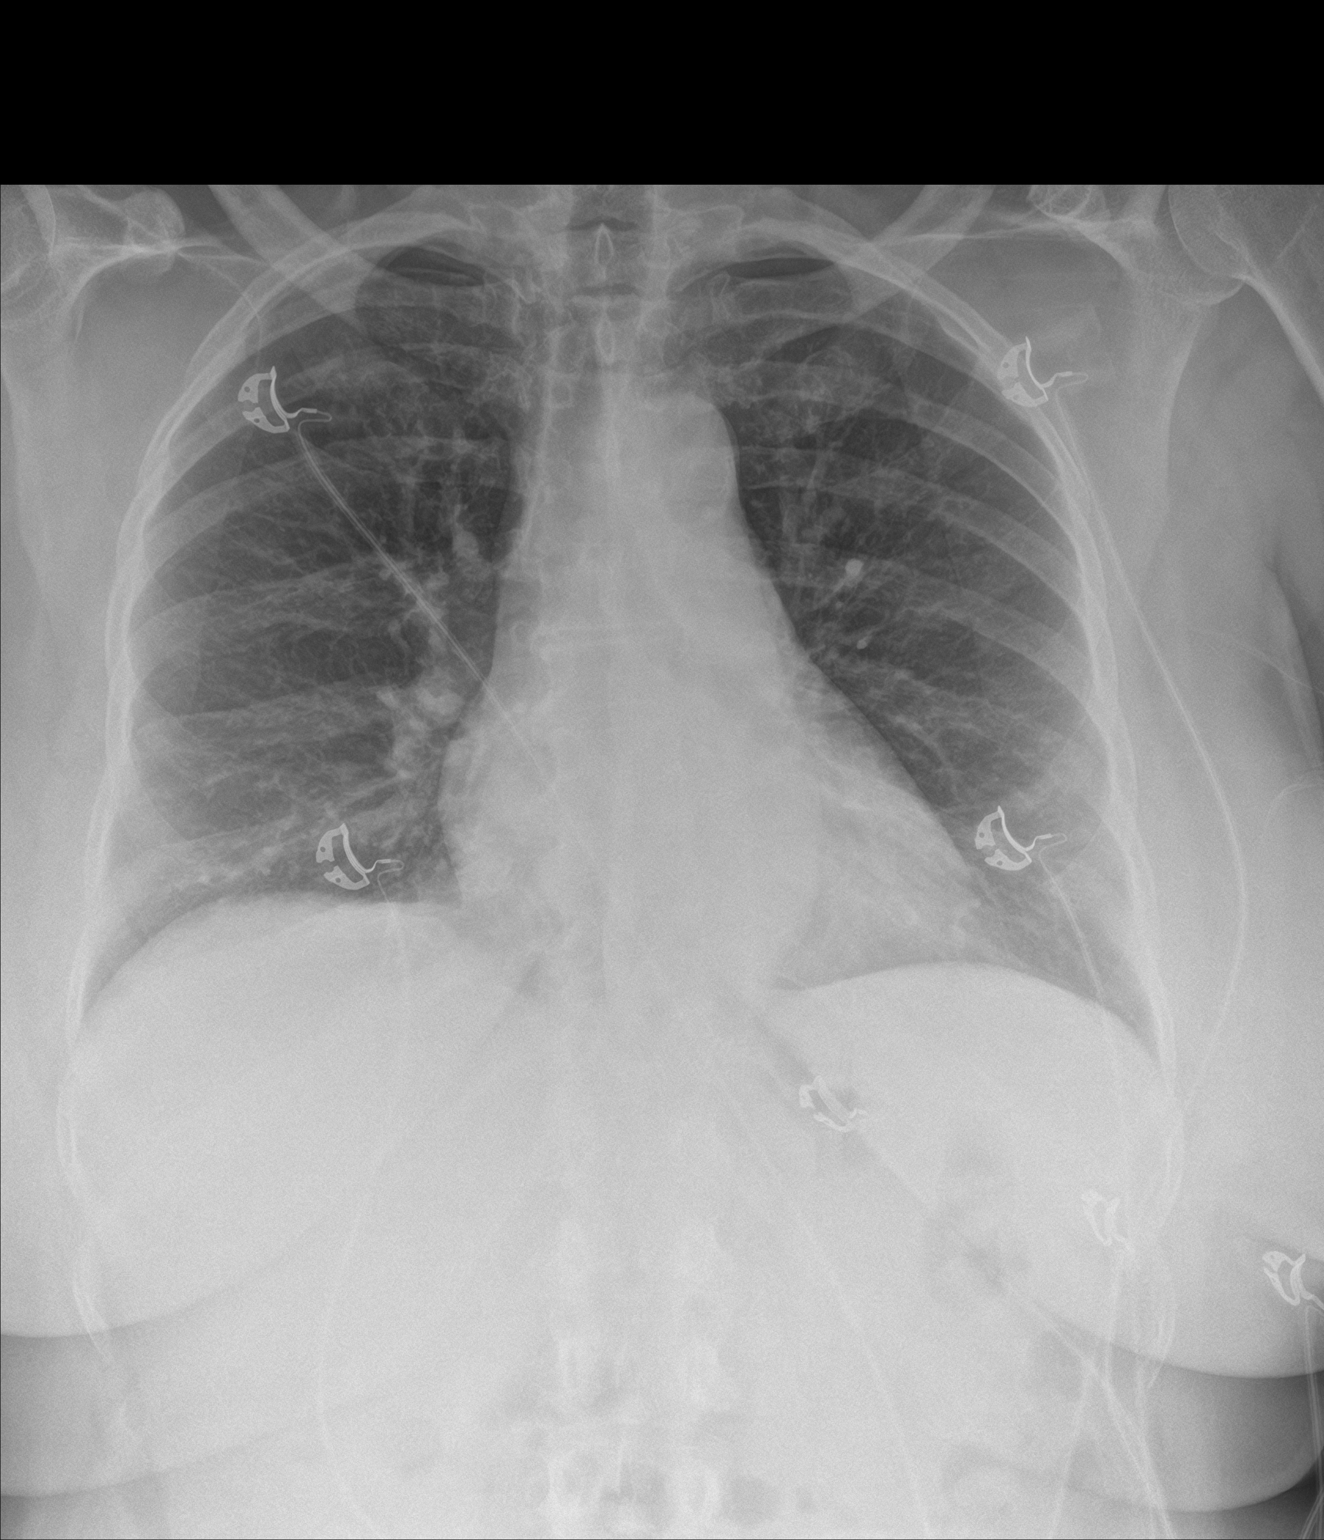

[chest lat]
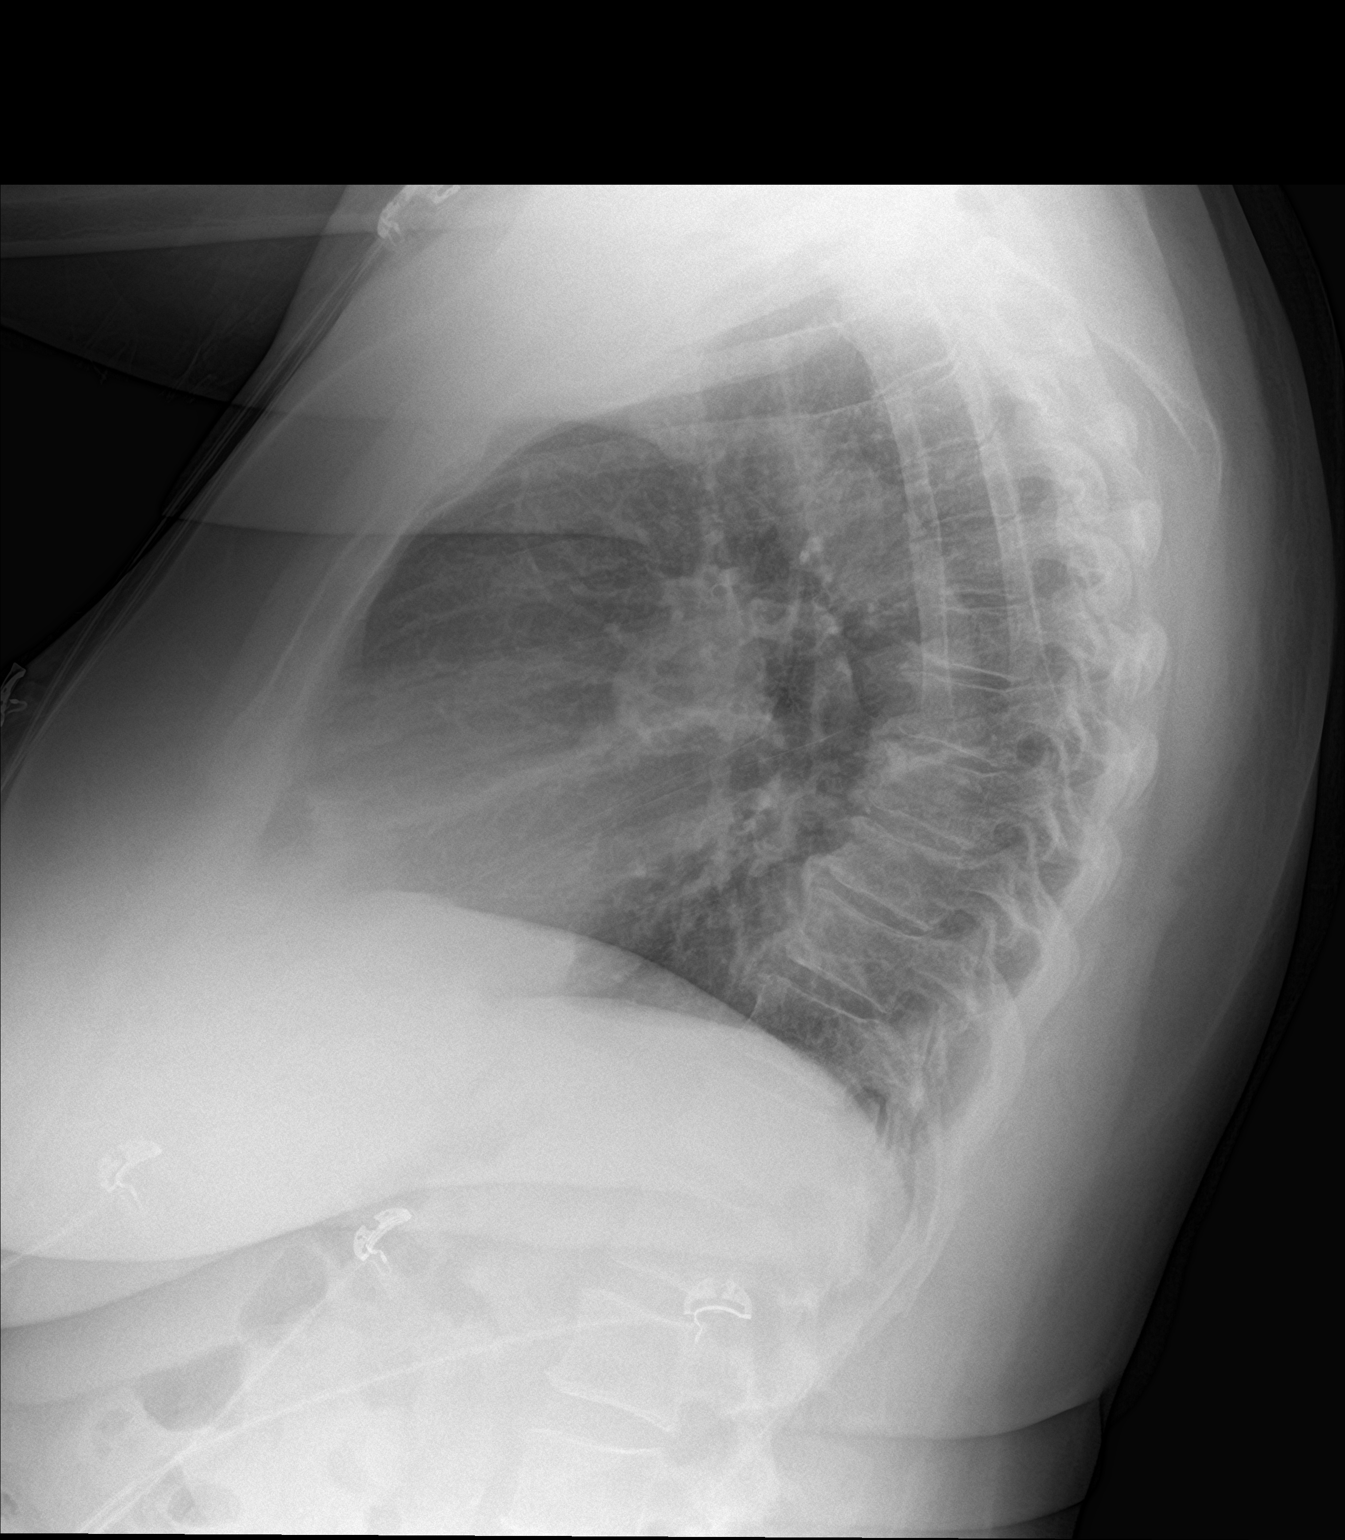

[2 of 2 positions shown; findings below may reference images not displayed]

FINDINGS: Heart is top-normal in size. Mild-to-moderate aortic atherosclerosis
without aneurysmal dilatation is noted. There is minimal atelectasis
at the lung bases. No overt pulmonary edema, effusion or
pneumothorax. Stable thoracic spondylosis without acute osseous
appearing abnormality.
IMPRESSION: No active cardiopulmonary disease. Aortic atherosclerosis. Bibasilar
atelectasis.

## 2019-04-05 ENCOUNTER — Other Ambulatory Visit: Payer: Self-pay | Admitting: Family Medicine

## 2019-04-17 DIAGNOSIS — G4733 Obstructive sleep apnea (adult) (pediatric): Secondary | ICD-10-CM | POA: Diagnosis not present

## 2019-04-25 DIAGNOSIS — G4733 Obstructive sleep apnea (adult) (pediatric): Secondary | ICD-10-CM | POA: Diagnosis not present

## 2019-05-01 ENCOUNTER — Emergency Department (HOSPITAL_COMMUNITY)
Admission: EM | Admit: 2019-05-01 | Discharge: 2019-05-01 | Disposition: A | Discharge status: Home / Self Care | Payer: Medicare PPO | Attending: Emergency Medicine | Admitting: Emergency Medicine

## 2019-05-01 ENCOUNTER — Emergency Department (HOSPITAL_COMMUNITY): Payer: Medicare PPO

## 2019-05-01 ENCOUNTER — Other Ambulatory Visit: Payer: Self-pay

## 2019-05-01 ENCOUNTER — Encounter (HOSPITAL_COMMUNITY): Payer: Self-pay | Admitting: *Deleted

## 2019-05-01 DIAGNOSIS — Z7982 Long term (current) use of aspirin: Secondary | ICD-10-CM | POA: Diagnosis not present

## 2019-05-01 DIAGNOSIS — Z79899 Other long term (current) drug therapy: Secondary | ICD-10-CM | POA: Diagnosis not present

## 2019-05-01 DIAGNOSIS — R109 Unspecified abdominal pain: Secondary | ICD-10-CM | POA: Diagnosis present

## 2019-05-01 DIAGNOSIS — R1084 Generalized abdominal pain: Secondary | ICD-10-CM | POA: Diagnosis not present

## 2019-05-01 DIAGNOSIS — N3001 Acute cystitis with hematuria: Secondary | ICD-10-CM | POA: Insufficient documentation

## 2019-05-01 DIAGNOSIS — R110 Nausea: Secondary | ICD-10-CM | POA: Diagnosis not present

## 2019-05-01 DIAGNOSIS — I10 Essential (primary) hypertension: Secondary | ICD-10-CM | POA: Insufficient documentation

## 2019-05-01 DIAGNOSIS — Z87891 Personal history of nicotine dependence: Secondary | ICD-10-CM | POA: Diagnosis not present

## 2019-05-01 DIAGNOSIS — R112 Nausea with vomiting, unspecified: Secondary | ICD-10-CM | POA: Diagnosis not present

## 2019-05-01 DIAGNOSIS — E119 Type 2 diabetes mellitus without complications: Secondary | ICD-10-CM | POA: Insufficient documentation

## 2019-05-01 DIAGNOSIS — R197 Diarrhea, unspecified: Secondary | ICD-10-CM | POA: Diagnosis not present

## 2019-05-01 DIAGNOSIS — R1110 Vomiting, unspecified: Secondary | ICD-10-CM | POA: Diagnosis not present

## 2019-05-01 LAB — URINALYSIS, ROUTINE W REFLEX MICROSCOPIC
Bilirubin Urine: NEGATIVE
Glucose, UA: NEGATIVE mg/dL
Ketones, ur: NEGATIVE mg/dL
Leukocytes,Ua: NEGATIVE
Nitrite: POSITIVE — AB
Protein, ur: NEGATIVE mg/dL
Specific Gravity, Urine: 1.024 (ref 1.005–1.030)
pH: 5 (ref 5.0–8.0)

## 2019-05-01 LAB — COMPREHENSIVE METABOLIC PANEL
ALT: 26 U/L (ref 0–44)
AST: 20 U/L (ref 15–41)
Albumin: 4.3 g/dL (ref 3.5–5.0)
Alkaline Phosphatase: 60 U/L (ref 38–126)
Anion gap: 11 (ref 5–15)
BUN: 20 mg/dL (ref 8–23)
CO2: 29 mmol/L (ref 22–32)
Calcium: 9.5 mg/dL (ref 8.9–10.3)
Chloride: 102 mmol/L (ref 98–111)
Creatinine, Ser: 0.79 mg/dL (ref 0.44–1.00)
GFR calc Af Amer: >60 mL/min (ref >60)
GFR calc non Af Amer: >60 mL/min (ref >60)
Glucose, Bld: 103 mg/dL — ABNORMAL HIGH (ref 70–99)
Potassium: 3.6 mmol/L (ref 3.5–5.1)
Sodium: 142 mmol/L (ref 135–145)
Total Bilirubin: 0.4 mg/dL (ref 0.3–1.2)
Total Protein: 7.7 g/dL (ref 6.5–8.1)

## 2019-05-01 LAB — CBC WITH DIFFERENTIAL/PLATELET
Abs Immature Granulocytes: 0.01 10*3/uL (ref 0.00–0.07)
Basophils Absolute: 0 10*3/uL (ref 0.0–0.1)
Basophils Relative: 1 %
Eosinophils Absolute: 0.2 10*3/uL (ref 0.0–0.5)
Eosinophils Relative: 4 %
HCT: 38.7 % (ref 36.0–46.0)
Hemoglobin: 12.2 g/dL (ref 12.0–15.0)
Immature Granulocytes: 0 %
Lymphocytes Relative: 45 %
Lymphs Abs: 2.1 10*3/uL (ref 0.7–4.0)
MCH: 26.3 pg (ref 26.0–34.0)
MCHC: 31.5 g/dL (ref 30.0–36.0)
MCV: 83.6 fL (ref 80.0–100.0)
Monocytes Absolute: 0.5 10*3/uL (ref 0.1–1.0)
Monocytes Relative: 11 %
Neutro Abs: 1.8 10*3/uL (ref 1.7–7.7)
Neutrophils Relative %: 39 %
Platelets: 222 10*3/uL (ref 150–400)
RBC: 4.63 MIL/uL (ref 3.87–5.11)
RDW: 15.6 % — ABNORMAL HIGH (ref 11.5–15.5)
WBC: 4.6 10*3/uL (ref 4.0–10.5)
nRBC: 0 % (ref 0.0–0.2)

## 2019-05-01 LAB — CBG MONITORING, ED: Glucose-Capillary: 103 mg/dL — ABNORMAL HIGH (ref 70–99)

## 2019-05-01 LAB — LIPASE, BLOOD: Lipase: 46 U/L (ref 11–51)

## 2019-05-01 MED ORDER — CEPHALEXIN 500 MG PO CAPS: 500.0000 mg | ORAL_CAPSULE | Freq: Two times a day (BID) | ORAL | 0 refills | Status: DC

## 2019-05-01 MED ORDER — IOHEXOL 300 MG/ML  SOLN
75.0000 mL | Freq: Once | INTRAMUSCULAR | Status: AC | PRN
  Administered 2019-05-01: 75 mL via INTRAVENOUS

## 2019-05-01 MED ORDER — ONDANSETRON HCL 4 MG/2ML IJ SOLN
4.0000 mg | Freq: Once | INTRAMUSCULAR | Status: AC
  Administered 2019-05-01: 4 mg via INTRAVENOUS
  Filled 2019-05-01: qty 2

## 2019-05-01 MED ORDER — MORPHINE SULFATE (PF) 4 MG/ML IV SOLN
4.0000 mg | Freq: Once | INTRAVENOUS | Status: AC
  Administered 2019-05-01: 4 mg via INTRAVENOUS
  Filled 2019-05-01: qty 1

## 2019-05-01 NOTE — Discharge Instructions (Signed)
Take your entire course of the antibiotics until completed (7 days).  Make sure you are drinking plenty of water.  Get rechecked if you develop any worsened pain, fevers, nausea or vomiting - these can be signs that the antibiotic is not resolving your infection.  Your CT scan and other labs are ok today.

## 2019-05-01 NOTE — ED Triage Notes (Signed)
Abdominal pain for 4 days with nausea only with diarrhea 3-4 times.

## 2019-05-01 NOTE — ED Provider Notes (Signed)
[facility] EMERGENCY DEPARTMENT Provider Note   CSN: [id number] Arrival date & time: 05/01/19  1059    History   Chief Complaint Chief Complaint  Patient presents with   Abdominal Pain    HPI Sophia [name] is a 68 y.o. female with a history significant for hypertension, GERD on Protonix, diet-controlled diabetes, arthritis and depression presenting with a 4-day history of generalized abdominal pain.  She describes intermittent waxing and waning cramping pain which has been migratory, but most recently is located in the left upper and left lower abdomen.  It has been associated with several episodes of small looser than normal yellow stools, most recently this morning.  She does obtain temporary relief after a bowel movement.  She has had no fevers, she does endorse nausea without emesis.  She also describes urinary hesitancy but otherwise denies dysuria.  She has tried Azo which has not relieved any of her symptoms.  She has found no other alleviators.  No other household members currently have similar symptoms, but she reports her father-in-law who lives in the home had complaints of abdominal pain last week which have resolved.  She has had no recent travel, denies any recent antibiotic use, no recent concern for consumption of improperly cooked foods.  Denies chest pain and shortness of breath.  Surgical history significant for abdominal hysterectomy and excision of abdominal benign tumors many years ago.     The history is provided by the patient.    Past Medical History:  Diagnosis Date   Anxiety    Arthritis    Depression    Diabetes mellitus without complication ([place])    GERD (gastroesophageal reflux disease)    Hypercholesteremia    Hypertension    PONV (postoperative nausea and vomiting)     Patient Active Problem List   Diagnosis Date Noted   GAD (generalized anxiety disorder) 12/03/2018   Skin ulcer ([place]) 10/08/2018   Chest pain 06/22/2018   Leg  cramps 06/15/2018   Panic attack 06/15/2018   Tubular adenoma of colon 04/28/2014   History of rectal bleeding 02/19/2014   Anemia 02/17/2014   Back pain with radiation 10/05/2013   Seasonal allergies 05/01/2011   Diabetes mellitus type 2 in obese ([place]) 11/28/2010   Palpitations 05/17/2009   Obesity (BMI 30-39.9) 02/24/2009   Hyperlipidemia with target LDL less than 100 06/17/2008   Depression with anxiety 11/21/2006   Essential hypertension 11/21/2006   GERD 11/21/2006    Past Surgical History:  Procedure Laterality Date   ABDOMINAL HYSTERECTOMY  1982   tubes and womb, bleeding and ectopic   ABDOMINAL SURGERY     removal of tumors   BREAST SURGERY Left 1983   benign tumor   CATARACT EXTRACTION W/PHACO Right 01/11/2014   Procedure: CATARACT EXTRACTION PHACO AND INTRAOCULAR LENS PLACEMENT ([place]);  Surgeon: Elta Guadeloupe T. Gershon Crane, MD;  Location: [facility];  Service: Ophthalmology;  Laterality: Right;  CDE 5.57   CATARACT EXTRACTION W/PHACO Left 01/25/2014   Procedure: CATARACT EXTRACTION PHACO AND INTRAOCULAR LENS PLACEMENT ([facility]);  Surgeon: Elta Guadeloupe T. Gershon Crane, MD;  Location: [facility];  Service: Ophthalmology;  Laterality: Left;  CDE:5.19   COLONOSCOPY  2007 [facility]   NL EXAM   COLONOSCOPY N/A 04/22/2014   Dr. Fields:Normal mucosa in the terminal ileum/Two COLON polyps REMOVED/ Mild diverticulosis in the ascending colon and transverse colon/The LEFT colon IS redundant/Small internal hemorrhoids. Path: tubular adenoma. Next colonoscopy in 5-10 years   ESOPHAGOGASTRODUODENOSCOPY N/A 04/22/2014   Dr. Fields:MICROCYTIC ANEMIA MOST LIKELY  DUE TO ASA/VOLTAREN/Small hiatal hernia/MODERATE Non-erosive gastritis. 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 Gershon Crane, MD;  Location: [facility];  Service: Ophthalmology;  Laterality: Right;   YAG LASER APPLICATION Left 1/82/9937   Procedure: YAG LASER APPLICATION;  Surgeon: Rutherford Guys, MD;  Location: [facility];  Service: Ophthalmology;  Laterality: Left;     OB History   No obstetric history on file.      Home Medications    Prior to Admission medications   Medication Sig Start Date End Date Taking? Authorizing Provider  ALPRAZolam Duanne Moron) 1 MG tablet Take 1 mg by mouth. Take 1/2 ti 1 tablet by mouth two times weekly for panic and uncontrolled anxiety.   Yes [provider]  amLODipine (NORVASC) 10 MG tablet Take 1 tablet (10 mg total) by mouth daily. 05/20/18  Yes Fayrene Helper, MD  aspirin EC 81 MG tablet Take 81 mg by mouth daily.   Yes [provider]  AZO-CRANBERRY PO Take 2 tablets by mouth 2 (two) times daily as needed (UTI).   Yes [provider]  benazepril-hydrochlorthiazide (LOTENSIN HCT) 20-12.5 MG tablet TAKE 2 TABLETS EVERY DAY 04/07/19  Yes Fayrene Helper, MD  busPIRone (BUSPAR) 5 MG tablet Take 1 tablet (5 mg total) by mouth 3 (three) times daily. 12/02/18  Yes Fayrene Helper, MD  FLUoxetine (PROZAC) 40 MG capsule Take 1 capsule (40 mg total) by mouth daily. 12/02/18  Yes Fayrene Helper, MD  Krill Oil (OMEGA-3) 500 MG CAPS Take 1 capsule by mouth daily.   Yes [provider]  Multiple Vitamin (MULTIVITAMIN WITH MINERALS) TABS tablet Take 1 tablet by mouth daily.   Yes [provider]  pantoprazole (PROTONIX) 40 MG tablet Take 1 tablet (40 mg total) by mouth  daily. 12/02/18  Yes Fayrene Helper, MD  pravastatin (PRAVACHOL) 40 MG tablet Take 1 tablet (40 mg total) by mouth every evening. 12/02/18  Yes Fayrene Helper, MD  cephALEXin (KEFLEX) 500 MG capsule Take 1 capsule (500 mg total) by mouth 2 (two) times daily. 05/01/19   Evalee Jefferson, PA-C    Family History Family History  Problem Relation Age of Onset   Colon polyps Sister 18   Diabetes Sister    Hypertension Sister    Kidney disease Sister    Arthritis Father    Cancer Father        prostate    Hypertension Father    Diabetes Sister    Hypertension Sister    CAD Paternal Grandmother    Colon cancer Neg Hx     Social History Social History   Tobacco Use   Smoking status: Former Smoker    Packs/day: 0.25    Years: 20.00    Pack years: 5.00    Types: Cigarettes    Quit date: 01/05/1990    Years since quitting: 29.3   Smokeless  tobacco: Former Systems developer    Types: Snuff    Quit date: 03/18/2018  Substance Use Topics   Alcohol use: No   Drug use: Yes    Types: Marijuana     Allergies   Patient has no known allergies.   Review of Systems Review of Systems  Constitutional: Negative for chills and fever.  HENT: Negative for congestion and sore throat.   Eyes: Negative.   Respiratory: Negative for chest tightness and shortness of breath.   Cardiovascular: Negative for chest pain.  Gastrointestinal: Positive for abdominal pain and nausea. Negative for abdominal distention, constipation, diarrhea and vomiting.  Genitourinary: Negative.   Musculoskeletal: Negative for arthralgias, joint swelling and neck pain.  Skin: Negative.  Negative for rash and wound.  Neurological: Negative for dizziness, weakness, light-headedness, numbness and headaches.  Psychiatric/Behavioral: Negative.      Physical Exam Updated Vital Signs BP 126/65 (BP Location: Right Arm)    Pulse (!) 58    Temp 98.1 F (36.7 C) (Oral)    Resp 15    Ht 5\' 9"  (1.753 m)    Wt 96.3 kg     SpO2 99%    BMI 31.35 kg/m   Physical Exam Vitals signs and nursing note reviewed.  Constitutional:      Appearance: She is well-developed.  HENT:     Head: Normocephalic and atraumatic.  Eyes:     Conjunctiva/sclera: Conjunctivae normal.  Neck:     Musculoskeletal: Normal range of motion.  Cardiovascular:     Rate and Rhythm: Normal rate and regular rhythm.     Heart sounds: Normal heart sounds.  Pulmonary:     Effort: Pulmonary effort is normal.     Breath sounds: Normal breath sounds. No wheezing.  Abdominal:     General: Bowel sounds are normal.     Palpations: Abdomen is soft.     Tenderness: There is abdominal tenderness in the left upper quadrant and left lower quadrant. There is no right CVA tenderness, left CVA tenderness, guarding or rebound.     Comments: Obese abdomen, midline well-healed vertical infraumbilical surgical incision.  Musculoskeletal: Normal range of motion.  Skin:    General: Skin is warm and dry.  Neurological:     Mental Status: She is alert.      ED Treatments / Results  Labs (all labs ordered are listed, but only abnormal results are displayed) Labs Reviewed  COMPREHENSIVE METABOLIC PANEL - Abnormal; Notable for the following components:      Result Value   Glucose, Bld 103 (*)    All other components within normal limits  CBC WITH DIFFERENTIAL/PLATELET - Abnormal; Notable for the following components:   RDW 15.6 (*)    All other components within normal limits  URINALYSIS, ROUTINE W REFLEX MICROSCOPIC - Abnormal; Notable for the following components:   Color, Urine AMBER (*)    Hgb urine dipstick SMALL (*)    Nitrite POSITIVE (*)    Bacteria, UA FEW (*)    All other components within normal limits  CBG MONITORING, ED - Abnormal; Notable for the following components:   Glucose-Capillary 103 (*)    All other components within normal limits  URINE CULTURE  LIPASE, BLOOD    EKG None  Radiology Ct Abdomen Pelvis W  Contrast  Result Date: 05/01/2019 CLINICAL DATA:  Abdominal pain with nausea and vomiting EXAM: CT ABDOMEN AND PELVIS WITH CONTRAST TECHNIQUE: Multidetector CT imaging of the abdomen and pelvis was performed using the standard protocol  following bolus administration of intravenous contrast. CONTRAST:  7mL OMNIPAQUE IOHEXOL 300 MG/ML  SOLN COMPARISON:  01/12/2007 FINDINGS: Lower chest: Lung bases are clear. Hepatobiliary: No focal hepatic lesion. No biliary duct dilatation. Gallbladder is normal. Common bile duct is normal. Pancreas: Pancreas is normal. No ductal dilatation. No pancreatic inflammation. Spleen: Normal spleen Adrenals/urinary tract: Mild thickening of the adrenal glands favored hyperplasia. Kidneys normal. Ureters and bladder normal. Within the LEFT renal cortex 8 mm lesion (image 30/2). Lesion is hypo dense to the adjacent renal parenchyma. Lesion too small to characterize. Stomach/Bowel: Stomach, small-bowel and cecum are normal. The appendix is not identified but there is no pericecal inflammation to suggest appendicitis. The colon and rectosigmoid colon are normal. Vascular/Lymphatic: Abdominal aorta is normal caliber with atherosclerotic calcification. There is no retroperitoneal or periportal lymphadenopathy. No pelvic lymphadenopathy. Reproductive: Post hysterectomy. Other: No inguinal hernia or ventral hernia. Musculoskeletal: No aggressive osseous lesion. IMPRESSION: 1. No acute findings in the abdomen pelvis. No explanation for abdominal pain. 2. Small hypodense lesion in the LEFT kidney is too small to characterize but is statistically benign. No further characterization required. This recommendation follows ACR consensus guidelines: Management of the Incidental Renal Mass on CT: A White Paper of the ACR Incidental Findings Committee. J Am Coll Radiol [phone]. Electronically Signed   By: Suzy Bouchard M.D.   On: 05/01/2019 13:27    Procedures Procedures (including critical  care time)  Medications Ordered in ED Medications  morphine 4 MG/ML injection 4 mg (4 mg Intravenous Given 05/01/19 1202)  ondansetron (ZOFRAN) injection 4 mg (4 mg Intravenous Given 05/01/19 1201)  iohexol (OMNIPAQUE) 300 MG/ML solution 75 mL (75 mLs Intravenous Contrast Given 05/01/19 1232)     Initial Impression / Assessment and Plan / ED Course  I have reviewed the triage vital signs and the nursing notes.  Pertinent labs & imaging results that were available during my care of the patient were reviewed by me and considered in my medical decision making (see chart for details).        Pt with generalized abdominal pain, positive uti, negative findings on Ct imaging including no diverticulitis, no pyelonephritis.  She was started on keflex.  Advised f/u with pcp prn, strict return precautions discussed, urine cx ordered.    Final Clinical Impressions(s) / ED Diagnoses   Final diagnoses:  Generalized abdominal pain  Acute cystitis with hematuria    ED Discharge Orders         Ordered    cephALEXin (KEFLEX) 500 MG capsule  2 times daily     05/01/19 1335           Evalee Jefferson, Hershal Coria 05/02/19 9977    Noemi Chapel, MD 05/03/19 [phone]

## 2019-05-03 LAB — URINE CULTURE: Culture: NO GROWTH

## 2019-05-24 DIAGNOSIS — E1169 Type 2 diabetes mellitus with other specified complication: Secondary | ICD-10-CM | POA: Diagnosis not present

## 2019-05-24 DIAGNOSIS — E785 Hyperlipidemia, unspecified: Secondary | ICD-10-CM | POA: Diagnosis not present

## 2019-05-24 DIAGNOSIS — E669 Obesity, unspecified: Secondary | ICD-10-CM | POA: Diagnosis not present

## 2019-05-24 DIAGNOSIS — I10 Essential (primary) hypertension: Secondary | ICD-10-CM | POA: Diagnosis not present

## 2019-05-25 DIAGNOSIS — G4733 Obstructive sleep apnea (adult) (pediatric): Secondary | ICD-10-CM | POA: Diagnosis not present

## 2019-05-25 LAB — CBC
HCT: 38.4 % (ref 35.0–45.0)
Hemoglobin: 12 g/dL (ref 11.7–15.5)
MCH: 25.7 pg — ABNORMAL LOW (ref 27.0–33.0)
MCHC: 31.3 g/dL — ABNORMAL LOW (ref 32.0–36.0)
MCV: 82.2 fL (ref 80.0–100.0)
MPV: 12.1 fL (ref 7.5–12.5)
Platelets: 226 10*3/uL (ref 140–400)
RBC: 4.67 10*6/uL (ref 3.80–5.10)
RDW: 14.9 % (ref 11.0–15.0)
WBC: 4.7 10*3/uL (ref 3.8–10.8)

## 2019-05-25 LAB — COMPLETE METABOLIC PANEL WITH GFR
AG Ratio: 1.6 (calc) (ref 1.0–2.5)
ALT: 22 U/L (ref 6–29)
AST: 17 U/L (ref 10–35)
Albumin: 4.4 g/dL (ref 3.6–5.1)
Alkaline phosphatase (APISO): 55 U/L (ref 37–153)
BUN: 18 mg/dL (ref 7–25)
CO2: 34 mmol/L — ABNORMAL HIGH (ref 20–32)
Calcium: 9.8 mg/dL (ref 8.6–10.4)
Chloride: 101 mmol/L (ref 98–110)
Creat: 0.74 mg/dL (ref 0.50–0.99)
GFR, Est African American: 96 mL/min/{1.73_m2} (ref >=60)
GFR, Est Non African American: 83 mL/min/{1.73_m2} (ref >=60)
Globulin: 2.8 g/dL (calc) (ref 1.9–3.7)
Glucose, Bld: 102 mg/dL — ABNORMAL HIGH (ref 65–99)
Potassium: 4.1 mmol/L (ref 3.5–5.3)
Sodium: 143 mmol/L (ref 135–146)
TOTAL PROTEIN: 7.2 g/dL (ref 6.1–8.1)
Total Bilirubin: 0.4 mg/dL (ref 0.2–1.2)

## 2019-05-25 LAB — LIPID PANEL
Cholesterol: 171 mg/dL (ref <200)
HDL: 57 mg/dL (ref >=50)
LDL Cholesterol (Calc): 92 mg/dL (calc)
Non-HDL Cholesterol (Calc): 114 mg/dL (calc) (ref <130)
TRIGLYCERIDES: 127 mg/dL (ref <150)
Total CHOL/HDL Ratio: 3 (calc) (ref <5.0)

## 2019-05-25 LAB — HEMOGLOBIN A1C
Hgb A1c MFr Bld: 6.1 % of total Hgb — ABNORMAL HIGH (ref <5.7)
Mean Plasma Glucose: 128 (calc)
eAG (mmol/L): 7.1 (calc)

## 2019-05-25 LAB — TSH: TSH: 2.03 mIU/L (ref 0.40–4.50)

## 2019-06-01 ENCOUNTER — Other Ambulatory Visit: Payer: Self-pay

## 2019-06-01 ENCOUNTER — Encounter (INDEPENDENT_AMBULATORY_CARE_PROVIDER_SITE_OTHER): Payer: Self-pay

## 2019-06-01 ENCOUNTER — Ambulatory Visit (INDEPENDENT_AMBULATORY_CARE_PROVIDER_SITE_OTHER): Payer: Medicare PPO | Admitting: Family Medicine

## 2019-06-01 ENCOUNTER — Encounter: Payer: Self-pay | Admitting: Family Medicine

## 2019-06-01 VITALS — BP 118/80 | HR 76 | Temp 97.6°F | Ht 69.0 in | Wt 197.0 lb

## 2019-06-01 DIAGNOSIS — Z0000 Encounter for general adult medical examination without abnormal findings: Secondary | ICD-10-CM

## 2019-06-01 DIAGNOSIS — F17220 Nicotine dependence, chewing tobacco, uncomplicated: Secondary | ICD-10-CM

## 2019-06-01 DIAGNOSIS — Z86010 Personal history of colonic polyps: Secondary | ICD-10-CM

## 2019-06-01 DIAGNOSIS — E559 Vitamin D deficiency, unspecified: Secondary | ICD-10-CM

## 2019-06-01 DIAGNOSIS — Z1211 Encounter for screening for malignant neoplasm of colon: Secondary | ICD-10-CM

## 2019-06-01 DIAGNOSIS — E785 Hyperlipidemia, unspecified: Secondary | ICD-10-CM

## 2019-06-01 DIAGNOSIS — I10 Essential (primary) hypertension: Secondary | ICD-10-CM

## 2019-06-01 DIAGNOSIS — Z1231 Encounter for screening mammogram for malignant neoplasm of breast: Secondary | ICD-10-CM

## 2019-06-01 DIAGNOSIS — R7303 Prediabetes: Secondary | ICD-10-CM

## 2019-06-01 DIAGNOSIS — R7301 Impaired fasting glucose: Secondary | ICD-10-CM

## 2019-06-01 DIAGNOSIS — D126 Benign neoplasm of colon, unspecified: Secondary | ICD-10-CM

## 2019-06-01 DIAGNOSIS — Z720 Tobacco use: Secondary | ICD-10-CM

## 2019-06-01 MED ORDER — PRAVASTATIN SODIUM 40 MG PO TABS: 40.0000 mg | ORAL_TABLET | Freq: Every evening | ORAL | 3 refills | Status: DC

## 2019-06-01 MED ORDER — PANTOPRAZOLE SODIUM 40 MG PO TBEC: 40.0000 mg | DELAYED_RELEASE_TABLET | Freq: Every day | ORAL | 3 refills | Status: DC

## 2019-06-01 MED ORDER — ALPRAZOLAM 1 MG PO TABS: ORAL_TABLET | ORAL | 1 refills | Status: DC

## 2019-06-01 MED ORDER — BUSPIRONE HCL 5 MG PO TABS: 5.0000 mg | ORAL_TABLET | Freq: Three times a day (TID) | ORAL | 3 refills | Status: DC

## 2019-06-01 MED ORDER — AMLODIPINE BESYLATE 10 MG PO TABS: 10.0000 mg | ORAL_TABLET | Freq: Every day | ORAL | 3 refills | Status: DC

## 2019-06-01 MED ORDER — FLUOXETINE HCL 40 MG PO CAPS: 40.0000 mg | ORAL_CAPSULE | Freq: Every day | ORAL | 3 refills | Status: DC

## 2019-06-01 NOTE — Patient Instructions (Addendum)
Keep wellness visit scheduled please.  Please schedule mammogram at checkout , due in August  MD follow up in January, call if you need me before, will order fasting lipid, cmp and eGFr, hBA1C and vit D for first week in January  Excellent labs and exam, no med changes, keep up the great work!  Need to ENTIRELY STOP SNUFF, and I know that you are highly motivated to doing this  Foot exam is good except for thickened toenails   You will be referred to Dr Oneida Alar for colonoscopy due to tubular adenom  Social distancing. Frequent hand washing with soap and water Keeping your hands off of your face. These 3 practices will help to keep both you and your community healthy during this time. Please practice them faithfully!

## 2019-06-01 NOTE — Progress Notes (Signed)
Sophia [name]     MRN: [id number]      DOB: Feb 26, 1951  HPI: Patient is in for annual physical exam. No other health concerns are expressed or addressed at the visit. Recent labs, if available are reviewed. Immunization is reviewed , and  updated if needed.   PE: BP 118/80   Pulse 76   Temp 97.6 F (36.4 C) (Temporal)   Ht 5\' 9"  (1.753 m)   Wt 197 lb (89.4 kg)   SpO2 97%   BMI 29.09 kg/m  Pleasant  female, alert and oriented x 3, in no cardio-pulmonary distress. Afebrile. HEENT No facial trauma or asymetry. Sinuses non tender.  Extra occullar muscles intact, External ears normal,  Oropharynx moist, no exudate. Neck: supple, no adenopathy,JVD or thyromegaly.No bruits.  Chest: Clear to ascultation bilaterally.No crackles or wheezes. Non tender to palpation  Breast: No exam performed  Cardiovascular system; Heart sounds normal,  S1 and  S2 ,no S3.  No murmur, or thrill. Apical beat not displaced Peripheral pulses normal.  Abdomen: Soft, non tender, no organomegaly or masses. No bruits. Bowel sounds normal. No guarding, tenderness or rebound.   Musculoskeletal exam: Decreased though adequate  ROM of spine, hips , shoulders and knees. No deformity ,swelling or crepitus noted. No muscle wasting or atrophy.   Neurologic: Cranial nerves 2 to 12 intact. Power, tone ,sensation and reflexes normal throughout. No disturbance in gait. No tremor.  Skin: Intact, no ulceration, erythema , scaling or rash noted.Thickened toenails bilaterally esp great toe Pigmentation normal throughout  Psych; Normal mood and affect. Judgement and concentration normal   Assessment & Plan:  Annual physical exam Annual exam as documented. Counseling done  re healthy lifestyle involving commitment to 150 minutes exercise per week, heart healthy diet, and attaining healthy weight.The importance of adequate sleep also discussed. Regular seat belt use and home safety, is also  discussed. Changes in health habits are decided on by the patient with goals and time frames  set for achieving them. Immunization and cancer screening needs are specifically addressed at this visit.   Prediabetes Patient educated about the importance of limiting  Carbohydrate intake , the need to commit to daily physical activity for a minimum of 30 minutes , and to commit weight loss. The fact that changes in all these areas will reduce or eliminate all together the development of diabetes is stressed.   Diabetic Labs Latest Ref Rng & Units 05/24/2019 05/01/2019 11/25/2018 07/16/2018 06/23/2018  HbA1c <5.7 % of total Hgb 6.1(H) - 6.5(H) - 6.7(H)  Microalbumin Not estab mg/dL - - - - -  Micro/Creat Ratio <30 mcg/mg creat - - - - -  Chol <200 mg/dL 171 - 206(H) - 178  HDL > OR = 50 mg/dL 57 - 51 - 41  Calc LDL mg/dL (calc) 92 - 130(H) - 84  Triglycerides <150 mg/dL 127 - 141 - 265(H)  Creatinine 0.50 - 0.99 mg/dL 0.74 0.79 0.87 1.00(H) 0.79   BP/Weight 06/01/2019 05/01/2019 02/02/2019 12/02/2018 10/08/2018 08/26/2018 3/54/5625  Systolic BP 638 937 342 876 811 572 620  Diastolic BP 80 65 74 80 80 82 83  Wt. (Lbs) 197 212.3 205 209 218 219.12 215  BMI 29.09 31.35 30.27 30.86 32.19 32.36 31.75   Foot/eye exam completion dates Latest Ref Rng & Units 01/21/2019 05/20/2018  Eye Exam No Retinopathy No Retinopathy -  Foot Form Completion - - Done    Improved HBa1C, she is congratulated on thois  Tubular adenoma  of colon Repeat colonoscopy due referral made  Current nicotine use Asked:confirms currently dips snuff Assess: willing to quit and  cutting back Advise: needs to QUIT to reduce risk of cancer, cardio and cerebrovascular disease Assist: counseled for 5 minutes and literature provided Arrange: follow up in 3 months

## 2019-06-01 NOTE — Assessment & Plan Note (Signed)
Repeat colonoscopy due referral made

## 2019-06-01 NOTE — Assessment & Plan Note (Signed)

## 2019-06-01 NOTE — Assessment & Plan Note (Signed)
Asked:confirms currently dips snuff Assess: willing to quit and  cutting back Advise: needs to QUIT to reduce risk of cancer, cardio and cerebrovascular disease Assist: counseled for 5 minutes and literature provided Arrange: follow up in 3 months

## 2019-06-01 NOTE — Assessment & Plan Note (Signed)
Patient educated about the importance of limiting  Carbohydrate intake , the need to commit to daily physical activity for a minimum of 30 minutes , and to commit weight loss. The fact that changes in all these areas will reduce or eliminate all together the development of diabetes is stressed.   Diabetic Labs Latest Ref Rng & Units 05/24/2019 05/01/2019 11/25/2018 07/16/2018 06/23/2018  HbA1c <5.7 % of total Hgb 6.1(H) - 6.5(H) - 6.7(H)  Microalbumin Not estab mg/dL - - - - -  Micro/Creat Ratio <30 mcg/mg creat - - - - -  Chol <200 mg/dL 171 - 206(H) - 178  HDL > OR = 50 mg/dL 57 - 51 - 41  Calc LDL mg/dL (calc) 92 - 130(H) - 84  Triglycerides <150 mg/dL 127 - 141 - 265(H)  Creatinine 0.50 - 0.99 mg/dL 0.74 0.79 0.87 1.00(H) 0.79   BP/Weight 06/01/2019 05/01/2019 02/02/2019 12/02/2018 10/08/2018 08/26/2018 7/37/1062  Systolic BP 694 854 627 035 009 381 829  Diastolic BP 80 65 74 80 80 82 83  Wt. (Lbs) 197 212.3 205 209 218 219.12 215  BMI 29.09 31.35 30.27 30.86 32.19 32.36 31.75   Foot/eye exam completion dates Latest Ref Rng & Units 01/21/2019 05/20/2018  Eye Exam No Retinopathy No Retinopathy -  Foot Form Completion - - Done    Improved HBa1C, she is congratulated on [employer]

## 2019-06-08 ENCOUNTER — Telehealth: Payer: Self-pay

## 2019-06-08 NOTE — Telephone Encounter (Signed)
Tried to call pt- LM to return call.

## 2019-06-08 NOTE — Telephone Encounter (Signed)
-----   Message from Danie Binder, MD sent at 06/07/2019  3:14 PM EDT ----- YOU CAN CONTACT PT. HER BLOOD COUNT IS NORMA AND NOTHING ON HER CT INDICATES SHE NEEDS A COLONOSCOPY. SHE CAN HAVE A COLONOSCOPY WHEN SHE IS READY BETWEEN JUN 2020 AND 2022. ----- Message ----- From: Claudina Lick, LPN Sent: 1/64/3539   2:08 PM EDT To: Danie Binder, MD  Dr.Fields,  This pt had a tcs in June 2015- 2 polyps- tubular adenoma and hyperplastic. Per telephone note, you wanted pt to have tcs in 5-10 years.  Pt went to the ED in June 2020 with abd pain (dx- possible UTI) and then saw Dr.Simpson in July for a physical and Dr.Simpson wants her to repeat her tcs due to the tubular adenoma.   Do you want the pt to repeat her tcs now?

## 2019-06-10 NOTE — Telephone Encounter (Signed)
I spoke with the pt, she said she is not having any problems right now and she would like to wait until 2022. Advised her to call if she has any problems and we will put her on the recall list for 2022.  Sophia [name], please nic tcs with [facility] in 05/2021.

## 2019-06-10 NOTE — Telephone Encounter (Signed)
Reminder in epic to have TCS in July 2022 with [facility]

## 2019-06-25 DIAGNOSIS — G4733 Obstructive sleep apnea (adult) (pediatric): Secondary | ICD-10-CM | POA: Diagnosis not present

## 2019-07-07 ENCOUNTER — Ambulatory Visit (HOSPITAL_COMMUNITY)
Admission: RE | Admit: 2019-07-07 | Discharge: 2019-07-07 | Disposition: A | Discharge status: Home / Self Care | Payer: Medicare PPO | Source: Ambulatory Visit | Attending: Family Medicine | Admitting: Family Medicine

## 2019-07-07 ENCOUNTER — Other Ambulatory Visit: Payer: Self-pay

## 2019-07-07 DIAGNOSIS — Z1231 Encounter for screening mammogram for malignant neoplasm of breast: Secondary | ICD-10-CM | POA: Diagnosis not present

## 2019-07-14 ENCOUNTER — Encounter: Payer: Self-pay | Admitting: Family Medicine

## 2019-07-14 ENCOUNTER — Ambulatory Visit (INDEPENDENT_AMBULATORY_CARE_PROVIDER_SITE_OTHER): Payer: Medicare PPO | Admitting: Family Medicine

## 2019-07-14 ENCOUNTER — Other Ambulatory Visit: Payer: Self-pay

## 2019-07-14 VITALS — BP 126/80 | Ht 69.0 in | Wt 212.0 lb

## 2019-07-14 DIAGNOSIS — Z0000 Encounter for general adult medical examination without abnormal findings: Secondary | ICD-10-CM

## 2019-07-14 NOTE — Progress Notes (Signed)
Subjective:   Sophia [name] is a 68 y.o. female who presents for Medicare Annual (Subsequent) preventive examination.  Location of Patient: Home Location of Provider: Telehealth Consent was obtain for visit to be over via telehealth. I verified that I am speaking with the correct person using two identifiers.   Review of Systems: Cardiac Risk Factors include: diabetes mellitus;obesity (BMI >30kg/m2);hypertension;dyslipidemia     Objective:     Vitals: BP 126/80   Ht 5\' 9"  (1.753 m)   Wt 212 lb (96.2 kg)   BMI 31.31 kg/m   Body mass index is 31.31 kg/m.  Advanced Directives 07/14/2019 05/01/2019 07/06/2018 06/22/2018 06/22/2018 11/20/2015 03/13/2015  Does Patient Have a Medical Advance Directive? No No No No No No No  Would patient like information on creating a medical advance directive? No - Patient declined No - Patient declined Yes (ED - Information included in AVS) No - Patient declined No - Patient declined No - patient declined information Yes - Educational materials given  Pre-existing out of facility DNR order (yellow form or pink MOST form) - - - - - - -    Tobacco Social History   Tobacco Use  Smoking Status Former Smoker  . Packs/day: 0.25  . Years: 20.00  . Pack years: 5.00  . Types: Cigarettes  . Quit date: 01/05/1990  . Years since quitting: 29.5  Smokeless Tobacco Current User  . Types: Snuff     Ready to quit: Not Answered Counseling given: Not Answered   Clinical Intake:  Pre-visit preparation completed: No  Pain : No/denies pain     Nutritional Status: BMI > 30  Obese Nutritional Risks: None Diabetes: Yes CBG done?: No Did pt. bring in CBG monitor from home?: No  How often do you need to have someone help you when you read instructions, pamphlets, or other written materials from your doctor or pharmacy?: 1 - Never What is the last grade level you completed in school?: 9th  Interpreter Needed?: No     Past Medical History:  Diagnosis  Date  . Anxiety   . Arthritis   . Depression   . Diabetes mellitus type 2 in obese [facility]) 11/28/2010   Qualifier: Diagnosis of  By: Moshe Cipro MD, Margaret  Diet controlled in 12/2016   . Diabetes mellitus without complication ([place])   . GERD (gastroesophageal reflux disease)   . Hypercholesteremia   . Hypertension   . PONV (postoperative nausea and vomiting)    Past Surgical History:  Procedure Laterality Date  . ABDOMINAL HYSTERECTOMY  1982   tubes and womb, bleeding and ectopic  . ABDOMINAL SURGERY     removal of tumors  . BREAST SURGERY Left 1983   benign tumor  . CATARACT EXTRACTION W/PHACO Right 01/11/2014   Procedure: CATARACT EXTRACTION PHACO AND INTRAOCULAR LENS PLACEMENT (IOC);  Surgeon: Elta Guadeloupe T. Gershon Crane, MD;  Location: [facility];  Service: Ophthalmology;  Laterality: Right;  CDE 5.57  . CATARACT EXTRACTION W/PHACO Left 01/25/2014   Procedure: CATARACT EXTRACTION PHACO AND INTRAOCULAR LENS PLACEMENT (IOC);  Surgeon: Elta Guadeloupe T. Gershon Crane, MD;  Location: [facility];  Service: Ophthalmology;  Laterality: Left;  CDE:5.19  . COLONOSCOPY  2007 [facility]   NL EXAM  . COLONOSCOPY N/A 04/22/2014   Dr. Fields:Normal mucosa in the terminal ileum/Two COLON polyps REMOVED/ Mild diverticulosis in the ascending colon and transverse colon/The LEFT colon IS redundant/Small internal hemorrhoids. Path: tubular adenoma. Next colonoscopy in 5-10 years  . ESOPHAGOGASTRODUODENOSCOPY N/A 04/22/2014   Dr. Fields:MICROCYTIC  ANEMIA MOST LIKELY DUE TO ASA/VOLTAREN/Small hiatal hernia/MODERATE Non-erosive gastritis. Negative H.pylori  . LEFT HEART CATH AND CORONARY ANGIOGRAPHY N/A 06/23/2018   Procedure: LEFT HEART CATH AND CORONARY ANGIOGRAPHY;  Surgeon: Jettie Booze, MD;  Location: [place] [facility];  Service: Cardiovascular;  Laterality: N/A;  . 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 ULTRASOUND GUIDANCE FOR VASCULAR ACCESS  06/23/2018   Procedure: Ultrasound Guidance For Vascular Access;  Surgeon: Jettie Booze, MD;  Location: [place] [facility];  Service: Cardiovascular;;  . UPPER GASTROINTESTINAL ENDOSCOPY  2007 CHEST PAIN   NL EXAM  . YAG LASER APPLICATION Right [id number]   Procedure: YAG LASER APPLICATION;  Surgeon: Elta Guadeloupe T. Gershon Crane, MD;  Location: [facility];  Service: Ophthalmology;  Laterality: Right;  . YAG LASER APPLICATION Left [id number]   Procedure: YAG LASER APPLICATION;  Surgeon: Rutherford Guys, MD;  Location: [facility];  Service: Ophthalmology;  Laterality: Left;   Family History  Problem Relation Age of Onset  . Colon polyps Sister 61  . Diabetes Sister   . Hypertension Sister   . Kidney disease Sister   . Arthritis Father   . Cancer Father        prostate   . Hypertension Father   . Diabetes Sister   . Hypertension Sister   . CAD Paternal Grandmother   . Colon cancer Neg Hx    Social History   Socioeconomic History  . Marital status: Married    Spouse name: Not on file  . Number of children: Not on file  . Years of education: Not on file  . Highest education level: Not on file  Occupational History  . Not on file  Social Needs  . Financial resource strain: Not hard at all  . Food insecurity    Worry: Never true    Inability: Never true  . Transportation needs    Medical: No    Non-medical: No  Tobacco Use  . Smoking status: Former Smoker    Packs/day: 0.25    Years: 20.00    Pack years: 5.00    Types: Cigarettes    Quit date: 01/05/1990    Years since quitting: 29.5  . Smokeless tobacco: Current User    Types: Snuff  Substance and Sexual Activity  . Alcohol use: No  . Drug use: Yes    Types: Marijuana  . Sexual activity: Not Currently    Birth control/protection: Surgical  Lifestyle  . Physical activity    Days per week: 7 days    Minutes per session: 10 min  . Stress: Not at all  Relationships  . Social connections    Talks on phone: More than three times a week    Gets together: Once a week    Attends religious service: More than 4 times  per year    Active member of club or organization: No    Attends meetings of clubs or organizations: Never    Relationship status: Married  Other Topics Concern  . Not on file  Social History Narrative  . Not on file    Outpatient Encounter Medications as of 07/14/2019  Medication Sig  . ALPRAZolam (XANAX) 1 MG tablet Take 1 mg by mouth. Take 1/2 ti 1 tablet by mouth two times weekly for panic and uncontrolled anxiety.  . ALPRAZolam (XANAX) 1 MG tablet Take 0.5 to 1 tablet two times weekly for panic and uncontrolled anxiety  . amLODipine (NORVASC) 10 MG tablet Take 1  tablet (10 mg total) by mouth daily.  Marland Kitchen aspirin EC 81 MG tablet Take 81 mg by mouth daily.  . AZO-CRANBERRY PO Take 2 tablets by mouth 2 (two) times daily as needed (UTI).  Marland Kitchen benazepril-hydrochlorthiazide (LOTENSIN HCT) 20-12.5 MG tablet TAKE 2 TABLETS EVERY DAY  . busPIRone (BUSPAR) 5 MG tablet Take 1 tablet (5 mg total) by mouth 3 (three) times daily.  Marland Kitchen FLUoxetine (PROZAC) 40 MG capsule Take 1 capsule (40 mg total) by mouth daily.  Marland Kitchen gabapentin (NEURONTIN) 100 MG capsule Take 100 mg by mouth 3 (three) times daily.  Javier Docker Oil (OMEGA-3) 500 MG CAPS Take 1 capsule by mouth daily.  . Multiple Vitamin (MULTIVITAMIN WITH MINERALS) TABS tablet Take 1 tablet by mouth daily.  . pantoprazole (PROTONIX) 40 MG tablet Take 1 tablet (40 mg total) by mouth daily.  . pravastatin (PRAVACHOL) 40 MG tablet Take 1 tablet (40 mg total) by mouth every evening.   No facility-administered encounter medications on file as of 07/14/2019.     Activities of Daily Living In your present state of health, do you have any difficulty performing the following activities: 07/14/2019  Hearing? N  Vision? N  Difficulty concentrating or making decisions? N  Walking or climbing stairs? N  Dressing or bathing? N  Doing errands, shopping? N  Some recent data might be hidden    Patient Care Team: Fayrene Helper, MD as PCP - General Josue Hector, MD as PCP - Cardiology (Cardiology) Danie Binder, MD as Consulting Physician (Gastroenterology) Rutherford Guys, MD as Consulting Physician (Ophthalmology) Carole Civil, MD as Consulting Physician (Orthopedic Surgery)    Assessment:   This is a routine wellness examination for Sophia [name].  Exercise Activities and Dietary recommendations Current Exercise Habits: Home exercise routine, Type of exercise: walking;Other - see comments, Time (Minutes): 30, Frequency (Times/Week): 5, Weekly Exercise (Minutes/Week): 150, Intensity: Mild, Exercise limited by: cardiac condition(s)  Goals    . Blood Pressure < 140/90       Fall Risk Fall Risk  07/14/2019 06/01/2019 02/02/2019 12/02/2018 08/26/2018  Falls in the past year? 0 0 0 0 No  Number falls in past yr: 0 0 0 - -  Injury with Fall? 0 0 0 - -  Follow up Falls evaluation completed;Education provided;Falls prevention discussed - - - -   Is the patient's home free of loose throw rugs in walkways, pet beds, electrical cords, etc?   yes      Grab bars in the bathroom? yes      Handrails on the stairs?   yes      Adequate lighting?   yes   Depression Screen PHQ 2/9 Scores 07/14/2019 06/01/2019 02/02/2019 12/02/2018  PHQ - 2 Score 1 0 - 1  PHQ- 9 Score - - - 1  Exception Documentation - - Patient refusal -     Cognitive Function     6CIT Screen 07/14/2019 07/06/2018  What Year? 0 points 0 points  What month? 0 points 0 points  What time? 0 points 0 points  Count back from 20 0 points 0 points  Months in reverse 4 points 4 points  Repeat phrase 2 points 2 points  Total Score 6 6    Immunization History  Administered Date(s) Administered  . Influenza Split 08/19/2014  . Influenza Whole 09/20/1996  . Influenza,inj,Quad PF,6+ Mos 09/15/2015, 08/06/2016, 07/06/2018  . Pneumococcal Conjugate-13 01/25/2015  . Pneumococcal Polysaccharide-23 11/28/2010, 03/25/2016  . Tdap 04/29/2014  . Zoster  11/28/2010    Qualifies for Shingles  Vaccine? completed  Screening Tests Health Maintenance  Topic Date Due  . COLONOSCOPY  04/23/2019  . FOOT EXAM  05/23/2019  . INFLUENZA VACCINE  06/19/2019  . HEMOGLOBIN A1C  11/24/2019  . OPHTHALMOLOGY EXAM  01/21/2020  . MAMMOGRAM  07/06/2021  . TETANUS/TDAP  04/29/2024  . DEXA SCAN  Completed  . Hepatitis C Screening  Completed  . PNA vac Low Risk Adult  Completed    Cancer Screenings: Lung: Low Dose CT Chest recommended if Age 15-80 years, 30 pack-year currently smoking OR have quit w/in 15years. Patient does not qualify. Breast:  Up to date on Mammogram? Yes   Up to date of Bone Density/Dexa? Yes Colorectal: Due 2020  Additional Screenings:  Hepatitis C Screening: completed     Plan:    1. Encounter for Medicare annual wellness exam    I have personally reviewed and noted the following in the patient's chart:   . Medical and social history . Use of alcohol, tobacco or illicit drugs  . Current medications and supplements . Functional ability and status . Nutritional status . Physical activity . Advanced directives . List of other physicians . Hospitalizations, surgeries, and ER visits in previous 12 months . Vitals . Screenings to include cognitive, depression, and falls . Referrals and appointments  In addition, I have reviewed and discussed with patient certain preventive protocols, quality metrics, and best practice recommendations. A written personalized care plan for preventive services as well as general preventive health recommendations were provided to patient.    I provided 20 minutes of non-face-to-face time during this encounter.   Perlie Mayo, NP  07/14/2019

## 2019-07-14 NOTE — Patient Instructions (Signed)
[name] , Thank you for taking time to come for your Medicare Wellness Visit. I appreciate your ongoing commitment to your health goals. Please review the following plan we discussed and let me know if I can assist you in the future.   Please continue to practice social distancing to keep you, your family, and our community safe.  If you must go out, please wear a Mask and practice good handwashing.  Screening recommendations/referrals: Colonoscopy: You reported the Dr stated you can wait 1 year Mammogram: Up to date Bone Density: Up to date Recommended yearly ophthalmology/optometry visit for glaucoma screening and checkup Recommended yearly dental visit for hygiene and checkup  Vaccinations: Influenza vaccine: Office call to set this up Pneumococcal vaccine: Completed Tdap vaccine: Due 2025 Shingles vaccine: Completed  Advanced directives: Let us know if you would like help with this  Conditions/risks identified: Falls  Next appointment: Flu Shot date:            , Follow up with Dr Moshe Cipro 12/07/2019    Preventive Care 65 Years and Older, Female Preventive care refers to lifestyle choices and visits with your health care provider that can promote health and wellness. What does preventive care include?  A yearly physical exam. This is also called an annual well check.  Dental exams once or twice a year.  Routine eye exams. Ask your health care provider how often you should have your eyes checked.  Personal lifestyle choices, including:  Daily care of your teeth and gums.  Regular physical activity.  Eating a healthy diet.  Avoiding tobacco and drug use.  Limiting alcohol use.  Practicing safe sex.  Taking low-dose aspirin every day.  Taking vitamin and mineral supplements as recommended by your health care provider. What happens during an annual well check? The services and screenings done by your health care provider during your annual well check will  depend on your age, overall health, lifestyle risk factors, and family history of disease. Counseling  Your health care provider may ask you questions about your:  Alcohol use.  Tobacco use.  Drug use.  Emotional well-being.  Home and relationship well-being.  Sexual activity.  Eating habits.  History of falls.  Memory and ability to understand (cognition).  Work and work Statistician.  Reproductive health. Screening  You may have the following tests or measurements:  Height, weight, and BMI.  Blood pressure.  Lipid and cholesterol levels. These may be checked every 5 years, or more frequently if you are over 9 years old.  Skin check.  Lung cancer screening. You may have this screening every year starting at age 38 if you have a 30-pack-year history of smoking and currently smoke or have quit within the past 15 years.  Fecal occult blood test (FOBT) of the stool. You may have this test every year starting at age 75.  Flexible sigmoidoscopy or colonoscopy. You may have a sigmoidoscopy every 5 years or a colonoscopy every 10 years starting at age 18.  Hepatitis C blood test.  Hepatitis B blood test.  Sexually transmitted disease (STD) testing.  Diabetes screening. This is done by checking your blood sugar (glucose) after you have not eaten for a while (fasting). You may have this done every 1-3 years.  Bone density scan. This is done to screen for osteoporosis. You may have this done starting at age 29.  Mammogram. This may be done every 1-2 years. Talk to your health care provider about how often you should have  regular mammograms. Talk with your health care provider about your test results, treatment options, and if necessary, the need for more tests. Vaccines  Your health care provider may recommend certain vaccines, such as:  Influenza vaccine. This is recommended every year.  Tetanus, diphtheria, and acellular pertussis (Tdap, Td) vaccine. You may need a  Td booster every 10 years.  Zoster vaccine. You may need this after age 80.  Pneumococcal 13-valent conjugate (PCV13) vaccine. One dose is recommended after age 70.  Pneumococcal polysaccharide (PPSV23) vaccine. One dose is recommended after age 65. Talk to your health care provider about which screenings and vaccines you need and how often you need them. This information is not intended to replace advice given to you by your health care provider. Make sure you discuss any questions you have with your health care provider. Document Released: 12/01/2015 Document Revised: 07/24/2016 Document Reviewed: 09/05/2015 Elsevier Interactive Patient Education  2017 [place] Prevention in the Home Falls can cause injuries. They can happen to people of all ages. There are many things you can do to make your home safe and to help prevent falls. What can I do on the outside of my home?  Regularly fix the edges of walkways and driveways and fix any cracks.  Remove anything that might make you trip as you walk through a door, such as a raised step or threshold.  Trim any bushes or trees on the path to your home.  Use bright outdoor lighting.  Clear any walking paths of anything that might make someone trip, such as rocks or tools.  Regularly check to see if handrails are loose or broken. Make sure that both sides of any steps have handrails.  Any raised decks and porches should have guardrails on the edges.  Have any leaves, snow, or ice cleared regularly.  Use sand or salt on walking paths during winter.  Clean up any spills in your garage right away. This includes oil or grease spills. What can I do in the bathroom?  Use night lights.  Install grab bars by the toilet and in the tub and shower. Do not use towel bars as grab bars.  Use non-skid mats or decals in the tub or shower.  If you need to sit down in the shower, use a plastic, non-slip stool.  Keep the floor dry. Clean  up any water that spills on the floor as soon as it happens.  Remove soap buildup in the tub or shower regularly.  Attach bath mats securely with double-sided non-slip rug tape.  Do not have throw rugs and other things on the floor that can make you trip. What can I do in the bedroom?  Use night lights.  Make sure that you have a light by your bed that is easy to reach.  Do not use any sheets or blankets that are too big for your bed. They should not hang down onto the floor.  Have a firm chair that has side arms. You can use this for support while you get dressed.  Do not have throw rugs and other things on the floor that can make you trip. What can I do in the kitchen?  Clean up any spills right away.  Avoid walking on wet floors.  Keep items that you use a lot in easy-to-reach places.  If you need to reach something above you, use a strong step stool that has a grab bar.  Keep electrical cords out of the  way.  Do not use floor polish or wax that makes floors slippery. If you must use wax, use non-skid floor wax.  Do not have throw rugs and other things on the floor that can make you trip. What can I do with my stairs?  Do not leave any items on the stairs.  Make sure that there are handrails on both sides of the stairs and use them. Fix handrails that are broken or loose. Make sure that handrails are as long as the stairways.  Check any carpeting to make sure that it is firmly attached to the stairs. Fix any carpet that is loose or worn.  Avoid having throw rugs at the top or bottom of the stairs. If you do have throw rugs, attach them to the floor with carpet tape.  Make sure that you have a light switch at the top of the stairs and the bottom of the stairs. If you do not have them, ask someone to add them for you. What else can I do to help prevent falls?  Wear shoes that:  Do not have high heels.  Have rubber bottoms.  Are comfortable and fit you well.  Are  closed at the toe. Do not wear sandals.  If you use a stepladder:  Make sure that it is fully opened. Do not climb a closed stepladder.  Make sure that both sides of the stepladder are locked into place.  Ask someone to hold it for you, if possible.  Clearly mark and make sure that you can see:  Any grab bars or handrails.  First and last steps.  Where the edge of each step is.  Use tools that help you move around (mobility aids) if they are needed. These include:  Canes.  Walkers.  Scooters.  Crutches.  Turn on the lights when you go into a dark area. Replace any light bulbs as soon as they burn out.  Set up your furniture so you have a clear path. Avoid moving your furniture around.  If any of your floors are uneven, fix them.  If there are any pets around you, be aware of where they are.  Review your medicines with your doctor. Some medicines can make you feel dizzy. This can increase your chance of falling. Ask your doctor what other things that you can do to help prevent falls. This information is not intended to replace advice given to you by your health care provider. Make sure you discuss any questions you have with your health care provider. Document Released: 08/31/2009 Document Revised: 04/11/2016 Document Reviewed: 12/09/2014 Elsevier Interactive Patient Education  2017 [employer].

## 2019-07-26 DIAGNOSIS — G4733 Obstructive sleep apnea (adult) (pediatric): Secondary | ICD-10-CM | POA: Diagnosis not present

## 2019-08-16 ENCOUNTER — Ambulatory Visit (INDEPENDENT_AMBULATORY_CARE_PROVIDER_SITE_OTHER): Payer: Medicare PPO

## 2019-08-16 ENCOUNTER — Other Ambulatory Visit: Payer: Self-pay

## 2019-08-16 DIAGNOSIS — Z23 Encounter for immunization: Secondary | ICD-10-CM

## 2019-08-25 DIAGNOSIS — G4733 Obstructive sleep apnea (adult) (pediatric): Secondary | ICD-10-CM | POA: Diagnosis not present

## 2019-08-28 ENCOUNTER — Emergency Department (HOSPITAL_COMMUNITY): Payer: Medicare PPO

## 2019-08-28 ENCOUNTER — Inpatient Hospital Stay (HOSPITAL_COMMUNITY)
Admission: EM | Admit: 2019-08-28 | Discharge: 2019-09-03 | DRG: 177 | Disposition: A | Discharge status: Home / Self Care | Payer: Medicare PPO | Attending: Internal Medicine | Admitting: Internal Medicine

## 2019-08-28 ENCOUNTER — Other Ambulatory Visit: Payer: Self-pay

## 2019-08-28 ENCOUNTER — Encounter (HOSPITAL_COMMUNITY): Payer: Self-pay

## 2019-08-28 DIAGNOSIS — J1289 Other viral pneumonia: Secondary | ICD-10-CM | POA: Diagnosis not present

## 2019-08-28 DIAGNOSIS — R0902 Hypoxemia: Secondary | ICD-10-CM | POA: Diagnosis not present

## 2019-08-28 DIAGNOSIS — E785 Hyperlipidemia, unspecified: Secondary | ICD-10-CM | POA: Diagnosis present

## 2019-08-28 DIAGNOSIS — Z833 Family history of diabetes mellitus: Secondary | ICD-10-CM

## 2019-08-28 DIAGNOSIS — Z79899 Other long term (current) drug therapy: Secondary | ICD-10-CM | POA: Diagnosis not present

## 2019-08-28 DIAGNOSIS — R1110 Vomiting, unspecified: Secondary | ICD-10-CM | POA: Diagnosis not present

## 2019-08-28 DIAGNOSIS — K219 Gastro-esophageal reflux disease without esophagitis: Secondary | ICD-10-CM | POA: Diagnosis present

## 2019-08-28 DIAGNOSIS — I10 Essential (primary) hypertension: Secondary | ICD-10-CM | POA: Diagnosis present

## 2019-08-28 DIAGNOSIS — U071 COVID-19: Principal | ICD-10-CM | POA: Diagnosis present

## 2019-08-28 DIAGNOSIS — Z961 Presence of intraocular lens: Secondary | ICD-10-CM | POA: Diagnosis present

## 2019-08-28 DIAGNOSIS — R7303 Prediabetes: Secondary | ICD-10-CM | POA: Diagnosis not present

## 2019-08-28 DIAGNOSIS — E669 Obesity, unspecified: Secondary | ICD-10-CM | POA: Diagnosis not present

## 2019-08-28 DIAGNOSIS — Z9841 Cataract extraction status, right eye: Secondary | ICD-10-CM | POA: Diagnosis not present

## 2019-08-28 DIAGNOSIS — Z9842 Cataract extraction status, left eye: Secondary | ICD-10-CM

## 2019-08-28 DIAGNOSIS — Z7982 Long term (current) use of aspirin: Secondary | ICD-10-CM

## 2019-08-28 DIAGNOSIS — Z6828 Body mass index (BMI) 28.0-28.9, adult: Secondary | ICD-10-CM

## 2019-08-28 DIAGNOSIS — E1142 Type 2 diabetes mellitus with diabetic polyneuropathy: Secondary | ICD-10-CM | POA: Diagnosis present

## 2019-08-28 DIAGNOSIS — J069 Acute upper respiratory infection, unspecified: Secondary | ICD-10-CM

## 2019-08-28 DIAGNOSIS — F329 Major depressive disorder, single episode, unspecified: Secondary | ICD-10-CM | POA: Diagnosis present

## 2019-08-28 DIAGNOSIS — Z87891 Personal history of nicotine dependence: Secondary | ICD-10-CM | POA: Diagnosis not present

## 2019-08-28 DIAGNOSIS — E7800 Pure hypercholesterolemia, unspecified: Secondary | ICD-10-CM | POA: Diagnosis not present

## 2019-08-28 DIAGNOSIS — F411 Generalized anxiety disorder: Secondary | ICD-10-CM | POA: Diagnosis not present

## 2019-08-28 DIAGNOSIS — Z8249 Family history of ischemic heart disease and other diseases of the circulatory system: Secondary | ICD-10-CM

## 2019-08-28 DIAGNOSIS — R918 Other nonspecific abnormal finding of lung field: Secondary | ICD-10-CM | POA: Diagnosis not present

## 2019-08-28 DIAGNOSIS — R197 Diarrhea, unspecified: Secondary | ICD-10-CM | POA: Diagnosis not present

## 2019-08-28 HISTORY — DX: Acute upper respiratory infection, unspecified: J06.9

## 2019-08-28 HISTORY — DX: COVID-19: U07.1

## 2019-08-28 LAB — CBC WITH DIFFERENTIAL/PLATELET
Abs Immature Granulocytes: 0.02 10*3/uL (ref 0.00–0.07)
Basophils Absolute: 0 10*3/uL (ref 0.0–0.1)
Basophils Relative: 0 %
Eosinophils Absolute: 0 10*3/uL (ref 0.0–0.5)
Eosinophils Relative: 0 %
HCT: 40.3 % (ref 36.0–46.0)
Hemoglobin: 12.5 g/dL (ref 12.0–15.0)
Immature Granulocytes: 0 %
Lymphocytes Relative: 20 %
Lymphs Abs: 1.1 10*3/uL (ref 0.7–4.0)
MCH: 25.5 pg — ABNORMAL LOW (ref 26.0–34.0)
MCHC: 31 g/dL (ref 30.0–36.0)
MCV: 82.2 fL (ref 80.0–100.0)
Monocytes Absolute: 0.5 10*3/uL (ref 0.1–1.0)
Monocytes Relative: 9 %
Neutro Abs: 3.7 10*3/uL (ref 1.7–7.7)
Neutrophils Relative %: 71 %
Platelets: 203 10*3/uL (ref 150–400)
RBC: 4.9 MIL/uL (ref 3.87–5.11)
RDW: 14.7 % (ref 11.5–15.5)
WBC: 5.3 10*3/uL (ref 4.0–10.5)
nRBC: 0 % (ref 0.0–0.2)

## 2019-08-28 LAB — URINALYSIS, ROUTINE W REFLEX MICROSCOPIC
Bacteria, UA: NONE SEEN
Bilirubin Urine: NEGATIVE
Glucose, UA: NEGATIVE mg/dL
Ketones, ur: 5 mg/dL — AB
Leukocytes,Ua: NEGATIVE
Nitrite: POSITIVE — AB
Protein, ur: >=300 mg/dL — AB
Specific Gravity, Urine: 1.014 (ref 1.005–1.030)
pH: 6 (ref 5.0–8.0)

## 2019-08-28 LAB — COMPREHENSIVE METABOLIC PANEL
ALT: 32 U/L (ref 0–44)
AST: 32 U/L (ref 15–41)
Albumin: 3.9 g/dL (ref 3.5–5.0)
Alkaline Phosphatase: 70 U/L (ref 38–126)
Anion gap: 13 (ref 5–15)
BUN: 16 mg/dL (ref 8–23)
CO2: 30 mmol/L (ref 22–32)
Calcium: 9 mg/dL (ref 8.9–10.3)
Chloride: 94 mmol/L — ABNORMAL LOW (ref 98–111)
Creatinine, Ser: 0.79 mg/dL (ref 0.44–1.00)
GFR calc Af Amer: >60 mL/min (ref >60)
GFR calc non Af Amer: >60 mL/min (ref >60)
Glucose, Bld: 122 mg/dL — ABNORMAL HIGH (ref 70–99)
Potassium: 3 mmol/L — ABNORMAL LOW (ref 3.5–5.1)
Sodium: 137 mmol/L (ref 135–145)
Total Bilirubin: 0.4 mg/dL (ref 0.3–1.2)
Total Protein: 8.2 g/dL — ABNORMAL HIGH (ref 6.5–8.1)

## 2019-08-28 LAB — FERRITIN: Ferritin: 555 ng/mL — ABNORMAL HIGH (ref 11–307)

## 2019-08-28 LAB — D-DIMER, QUANTITATIVE: D-Dimer, Quant: 0.49 ug/mL-FEU (ref 0.00–0.50)

## 2019-08-28 LAB — TRIGLYCERIDES: Triglycerides: 98 mg/dL (ref <150)

## 2019-08-28 LAB — SARS CORONAVIRUS 2 BY RT PCR (HOSPITAL ORDER, PERFORMED IN ~~LOC~~ HOSPITAL LAB): SARS Coronavirus 2: POSITIVE — AB

## 2019-08-28 LAB — FIBRINOGEN: Fibrinogen: 594 mg/dL — ABNORMAL HIGH (ref 210–475)

## 2019-08-28 LAB — LACTIC ACID, PLASMA
Lactic Acid, Venous: 1.3 mmol/L (ref 0.5–1.9)
Lactic Acid, Venous: 0.8 mmol/L (ref 0.5–1.9)

## 2019-08-28 LAB — PROCALCITONIN: Procalcitonin: <0.1 ng/mL

## 2019-08-28 LAB — LACTATE DEHYDROGENASE: LDH: 249 U/L — ABNORMAL HIGH (ref 98–192)

## 2019-08-28 LAB — C-REACTIVE PROTEIN: CRP: 8.5 mg/dL — ABNORMAL HIGH (ref <1.0)

## 2019-08-28 MED ORDER — SODIUM CHLORIDE 0.9 % IV SOLN
INTRAVENOUS | Status: DC
  Administered 2019-08-28: 11:00:00 via INTRAVENOUS

## 2019-08-28 MED ORDER — ACETAMINOPHEN 500 MG PO TABS
1000.0000 mg | ORAL_TABLET | Freq: Once | ORAL | Status: AC
  Administered 2019-08-28: 1000 mg via ORAL
  Filled 2019-08-28: qty 2

## 2019-08-28 MED ORDER — SODIUM CHLORIDE 0.9 % IV BOLUS
1000.0000 mL | Freq: Once | INTRAVENOUS | Status: AC
  Administered 2019-08-28: 08:00:00 1000 mL via INTRAVENOUS

## 2019-08-28 MED ORDER — SODIUM CHLORIDE 0.9 % IV SOLN
1.0000 g | Freq: Once | INTRAVENOUS | Status: AC
  Administered 2019-08-28: 1 g via INTRAVENOUS
  Filled 2019-08-28: qty 10

## 2019-08-28 MED ORDER — ACETAMINOPHEN 325 MG PO TABS
650.0000 mg | ORAL_TABLET | Freq: Four times a day (QID) | ORAL | Status: DC | PRN
  Administered 2019-08-29 (×2): 650 mg via ORAL
  Filled 2019-08-28 (×3): qty 2

## 2019-08-28 MED ORDER — SODIUM CHLORIDE 0.9 % IV SOLN
500.0000 mg | Freq: Once | INTRAVENOUS | Status: AC
  Administered 2019-08-28: 500 mg via INTRAVENOUS
  Filled 2019-08-28: qty 500

## 2019-08-28 MED ORDER — IOHEXOL 300 MG/ML  SOLN
100.0000 mL | Freq: Once | INTRAMUSCULAR | Status: AC | PRN
  Administered 2019-08-28: 100 mL via INTRAVENOUS

## 2019-08-28 NOTE — ED Triage Notes (Signed)
Pt states has not felt good x 3 days, body aches, nausea, vomiting, diarrhea.   Pt taking tylenol for the body aches.

## 2019-08-28 NOTE — ED Notes (Signed)
Pt ambulated in room on ra.  o2 sats were 93% then dropped to 87%.   Did not c/o any cp. Did say she had some sob and dizziness.   Gait was good and steady.

## 2019-08-28 NOTE — H&P (Signed)
History and Physical  Sophia [name] [id number] DOB: Nov 03, 1951 DOA: 08/28/2019  Referring physician: Dr Rogene Houston, ED physician PCP: Fayrene Helper, MD  Outpatient Specialists:   Patient Coming From: home  Chief Complaint: Generalized body aches  HPI: Sophia [name] is a 68 y.o. female with a history of diabetes type 2, hypertension, anxiety, depression, GERD.  Patient seen for generalized body aches, mild cough, elevated body temperature, diminished taste and smell that started a 3 days ago.  Her symptoms are worsening.  No palliating or provoking factors.  She does have a possible COVID contact: Her neighbors mother-in-law came down from [place] in September for a wedding, and later tested COVID positive.  Emergency Department Course: COVID positive, CT abdomen pelvis shows bilateral multifocal pneumonia.  Chest x-ray shows pneumonia.  White count normal.  CRP elevated.  Patient mildly hypoxic, especially with ambulation  Review of Systems:   Pt denies any fevers, chills, nausea, vomiting, diarrhea, constipation, abdominal pain, palpitations, headache, vision changes, lightheadedness, dizziness, melena, rectal bleeding.  Review of systems are otherwise negative  Past Medical History:  Diagnosis Date   Anxiety    Arthritis    Depression    Diabetes mellitus type 2 in obese ([place]) 11/28/2010   Qualifier: Diagnosis of  By: Moshe Cipro MD, Margaret  Diet controlled in 12/2016    Diabetes mellitus without complication (HCC)    GERD (gastroesophageal reflux disease)    Hypercholesteremia    Hypertension    PONV (postoperative nausea and vomiting)    Past Surgical History:  Procedure Laterality Date   ABDOMINAL HYSTERECTOMY  1982   tubes and womb, bleeding and ectopic   ABDOMINAL SURGERY     removal of tumors   BREAST SURGERY Left 1983   benign tumor   CATARACT EXTRACTION W/PHACO Right 01/11/2014   Procedure: CATARACT EXTRACTION PHACO AND INTRAOCULAR LENS  PLACEMENT ([place]);  Surgeon: Elta Guadeloupe T. Gershon Crane, MD;  Location: [facility];  Service: Ophthalmology;  Laterality: Right;  CDE 5.57   CATARACT EXTRACTION W/PHACO Left 01/25/2014   Procedure: CATARACT EXTRACTION PHACO AND INTRAOCULAR LENS PLACEMENT ([facility]);  Surgeon: Elta Guadeloupe T. Gershon Crane, MD;  Location: [facility];  Service: Ophthalmology;  Laterality: Left;  CDE:5.19   COLONOSCOPY  2007 [facility]   NL EXAM   COLONOSCOPY N/A 04/22/2014   Dr. Fields:Normal mucosa in the terminal ileum/Two COLON polyps REMOVED/ Mild diverticulosis in the ascending colon and transverse colon/The LEFT colon IS redundant/Small internal hemorrhoids. Path: tubular adenoma. Next colonoscopy in 5-10 years   ESOPHAGOGASTRODUODENOSCOPY N/A 04/22/2014   Dr. Fields:MICROCYTIC ANEMIA MOST LIKELY DUE TO ASA/VOLTAREN/Small hiatal hernia/MODERATE Non-erosive gastritis. 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 Gershon Crane, MD;  Location: [facility];  Service: Ophthalmology;  Laterality: Right;   YAG LASER APPLICATION Left [id number]   Procedure: YAG LASER APPLICATION;  Surgeon: Rutherford Guys, MD;  Location: [facility];  Service: Ophthalmology;  Laterality: Left;  Social History:  reports that she quit smoking about 29 years ago. Her smoking use included cigarettes. She has a 5.00 pack-year smoking history. Her smokeless tobacco use includes snuff. She reports current drug  use. Drug: Marijuana. She reports that she does not drink alcohol. Patient lives at home  No Known Allergies  Family History  Problem Relation Age of Onset   Colon polyps Sister 13   Diabetes Sister    Hypertension Sister    Kidney disease Sister    Arthritis Father    Cancer Father        prostate    Hypertension Father    Diabetes Sister    Hypertension Sister    CAD Paternal Grandmother    Colon cancer Neg Hx       Prior to Admission medications   Medication Sig Start Date End Date Taking? Authorizing Provider  ALPRAZolam Duanne Moron) 1 MG tablet Take 0.5 to 1 tablet two times weekly for panic and uncontrolled anxiety 06/01/19  Yes Fayrene Helper, MD  amLODipine (NORVASC) 10 MG tablet Take 1 tablet (10 mg total) by mouth daily. 06/01/19  Yes Fayrene Helper, MD  aspirin EC 81 MG tablet Take 81 mg by mouth daily.   Yes [provider]  AZO-CRANBERRY PO Take 2 tablets by mouth 2 (two) times daily as needed (UTI).   Yes [provider]  benazepril-hydrochlorthiazide (LOTENSIN HCT) 20-12.5 MG tablet TAKE 2 TABLETS EVERY DAY 04/07/19  Yes Fayrene Helper, MD  busPIRone (BUSPAR) 5 MG tablet Take 1 tablet (5 mg total) by mouth 3 (three) times daily. 06/01/19  Yes Fayrene Helper, MD  FLUoxetine (PROZAC) 40 MG capsule Take 1 capsule (40 mg total) by mouth daily. 06/01/19  Yes Fayrene Helper, MD  gabapentin (NEURONTIN) 100 MG capsule Take 100 mg by mouth 3 (three) times daily.   Yes [provider]  Astrid Drafts (OMEGA-3) 500 MG CAPS Take 1 capsule by mouth daily.   Yes [provider]  Multiple Vitamin (MULTIVITAMIN WITH MINERALS) TABS tablet Take 1 tablet by mouth daily.   Yes [provider]  pantoprazole (PROTONIX) 40 MG tablet Take 1 tablet (40 mg total) by mouth daily. 06/01/19  Yes Fayrene Helper, MD  pravastatin (PRAVACHOL) 40 MG tablet Take 1 tablet (40 mg total) by mouth every evening. 06/01/19  Yes Fayrene Helper, MD    Physical Exam: BP 120/68    Pulse 98    Temp 100 F (37.8 C) (Oral)    Resp 19    Ht 5\' 9"  (1.753 m)    Wt 98.4 kg    SpO2 90%    BMI 32.05 kg/m    General: Elderly female. Awake and alert and oriented x3. No acute cardiopulmonary distress.   HEENT: Normocephalic atraumatic.  Right and left ears normal in appearance.  Pupils equal, round, reactive to light. Extraocular muscles are intact. Sclerae anicteric and noninjected.  Moist mucosal membranes. No mucosal lesions.   Neck: Neck supple without lymphadenopathy. No carotid bruits. No masses palpated.   Cardiovascular: Regular rate with normal S1-S2 sounds. No murmurs, rubs, gallops auscultated. No JVD.   Respiratory: Rales and rhonchi throughout, worse in bases.  No accessory muscle use.  Abdomen: Soft, nontender, nondistended. Active bowel sounds. No masses or hepatosplenomegaly   Skin: No rashes, lesions, or ulcerations.  Dry, warm to touch. 2+ dorsalis pedis and radial pulses.  Musculoskeletal: No calf or leg pain. All major joints not erythematous nontender.  No upper or lower joint deformation.  Good ROM.  No contractures   Psychiatric: Intact judgment and insight. Pleasant and cooperative.  Neurologic: No focal neurological deficits. Strength is 5/5 and symmetric in upper and lower extremities.  Cranial nerves II through XII are grossly intact.           Labs on Admission: I have personally reviewed following labs and imaging studies  CBC: Recent Labs  Lab 08/28/19 0747  WBC 5.3  NEUTROABS 3.7  HGB 12.5  HCT 40.3  MCV 82.2  PLT 123456   Basic Metabolic Panel: Recent Labs  Lab 08/28/19 0747  NA 137  K 3.0*  CL 94*  CO2 30  GLUCOSE 122*  BUN 16  CREATININE 0.79  CALCIUM 9.0   GFR: Estimated Creatinine Clearance: 82.9 mL/min (by C-G formula based on SCr of 0.79 mg/dL). Liver Function Tests: Recent Labs  Lab 08/28/19 0747  AST 32  ALT 32  ALKPHOS 70  BILITOT 0.4  PROT 8.2*  ALBUMIN  3.9   No results for input(s): LIPASE, AMYLASE in the last 168 hours. No results for input(s): AMMONIA in the last 168 hours. Coagulation Profile: No results for input(s): INR, PROTIME in the last 168 hours. Cardiac Enzymes: No results for input(s): CKTOTAL, CKMB, CKMBINDEX, TROPONINI in the last 168 hours. BNP (last 3 results) No results for input(s): PROBNP in the last 8760 hours. HbA1C: No results for input(s): HGBA1C in the last 72 hours. CBG: No results for input(s): GLUCAP in the last 168 hours. Lipid Profile: Recent Labs    08/28/19 0843  TRIG 98   Thyroid Function Tests: No results for input(s): TSH, T4TOTAL, FREET4, T3FREE, THYROIDAB in the last 72 hours. Anemia Panel: Recent Labs    08/28/19 1108  FERRITIN 555*   Urine analysis:    Component Value Date/Time   COLORURINE AMBER (A) 08/28/2019 0740   APPEARANCEUR CLEAR 08/28/2019 0740   LABSPEC 1.014 08/28/2019 0740   PHURINE 6.0 08/28/2019 0740   GLUCOSEU NEGATIVE 08/28/2019 0740   GLUCOSEU NEG mg/dL 12/31/2006 0207   HGBUR SMALL (A) 08/28/2019 0740   BILIRUBINUR NEGATIVE 08/28/2019 0740   BILIRUBINUR neg 03/25/2016 1136   KETONESUR 5 (A) 08/28/2019 0740   PROTEINUR >=300 (A) 08/28/2019 0740   UROBILINOGEN 0.2 03/25/2016 1136   UROBILINOGEN 0.2 12/31/2006 0207   NITRITE POSITIVE (A) 08/28/2019 0740   LEUKOCYTESUR NEGATIVE 08/28/2019 0740   Sepsis Labs: @LABRCNTIP (procalcitonin:4,lacticidven:4) ) Recent Results (from the past 240 hour(s))  Culture, blood (routine x 2)     Status: None (Preliminary result)   Collection Time: 08/28/19  7:47 AM   Specimen: BLOOD RIGHT FOREARM  Result Value Ref Range Status   Specimen Description BLOOD RIGHT FOREARM  Final   Special Requests   Final    BOTTLES DRAWN AEROBIC AND ANAEROBIC Blood Culture adequate volume Performed at [facility], [address], [place], [place] [address]    Culture PENDING  Incomplete   Report Status PENDING  Incomplete  SARS Coronavirus  2 by RT PCR (hospital order, performed in [facility] hospital lab) Nasopharyngeal Nasopharyngeal Swab     Status: Abnormal   Collection Time: 08/28/19  7:47 AM   Specimen: Nasopharyngeal Swab  Result Value Ref Range Status   SARS Coronavirus 2 POSITIVE (A) NEGATIVE Final    Comment: RESULT CALLED TO, READ BACK BY AND VERIFIED WITH: HOLCOLM,R@1058  BY MATTHEWS, B 10.10.2020 (NOTE) If result is NEGATIVE SARS-CoV-2 target nucleic acids are NOT DETECTED. The SARS-CoV-2 RNA is generally detectable  in upper and lower  respiratory specimens during the acute phase of infection. The lowest  concentration of SARS-CoV-2 viral copies this assay can detect is 250  copies / mL. A negative result does not preclude SARS-CoV-2 infection  and should not be used as the sole basis for treatment or other  patient management decisions.  A negative result may occur with  improper specimen collection / handling, submission of specimen other  than nasopharyngeal swab, presence of viral mutation(s) within the  areas targeted by this assay, and inadequate number of viral copies  (<250 copies / mL). A negative result must be combined with clinical  observations, patient history, and epidemiological information. If result is POSITIVE SARS-CoV-2 target nucleic acids are DETECTE D. The SARS-CoV-2 RNA is generally detectable in upper and lower  respiratory specimens during the acute phase of infection.  Positive  results are indicative of active infection with SARS-CoV-2.  Clinical  correlation with patient history and other diagnostic information is  necessary to determine patient infection status.  Positive results do  not rule out bacterial infection or co-infection with other viruses. If result is PRESUMPTIVE POSTIVE SARS-CoV-2 nucleic acids MAY BE PRESENT.   A presumptive positive result was obtained on the submitted specimen  and confirmed on repeat testing.  While 2019 novel coronavirus  (SARS-CoV-2)  nucleic acids may be present in the submitted sample  additional confirmatory testing may be necessary for epidemiological  and / or clinical management purposes  to differentiate between  SARS-CoV-2 and other Sarbecovirus currently known to infect humans.  If clinically indicated additional testing with an alternate test  methodology (LAB745 3) is advised. The SARS-CoV-2 RNA is generally  detectable in upper and lower respiratory specimens during the acute  phase of infection. The expected result is Negative. Fact Sheet for Patients:  StrictlyIdeas.no Fact Sheet for Healthcare Providers: BankingDealers.co.za This test is not yet approved or cleared by the [place] FDA and has been authorized for detection and/or diagnosis of SARS-CoV-2 by FDA under an Emergency Use Authorization (EUA).  This EUA will remain in effect (meaning this test can be used) for the duration of the COVID-19 declaration under Section 564(b)(1) of the Act, 21 U.S.C. section 360bbb-3(b)(1), unless the authorization is terminated or revoked sooner. Performed at [facility], [address] [place], [place] [address]   Culture, blood (routine x 2)     Status: None (Preliminary result)   Collection Time: 08/28/19  8:02 AM   Specimen: Left Antecubital; Blood  Result Value Ref Range Status   Specimen Description LEFT ANTECUBITAL  Final   Special Requests   Final    BOTTLES DRAWN AEROBIC AND ANAEROBIC Blood Culture adequate volume Performed at [facility], [address], [place], [place] [address]    Culture PENDING  Incomplete   Report Status PENDING  Incomplete     Radiological Exams on Admission: Ct Abdomen Pelvis W Contrast  Result Date: 08/28/2019 CLINICAL DATA:  Per ED orders.Patient presents with low-grade fever, loss of taste and smell, vomiting and diarrhea for the past 3 days. Hx diabetes Diet controlled, htn/ bj COVID positive. EXAM: CT ABDOMEN AND  PELVIS WITH CONTRAST TECHNIQUE: Multidetector CT imaging of the abdomen and pelvis was performed using the standard protocol following bolus administration of intravenous contrast. CONTRAST:  156mL OMNIPAQUE IOHEXOL 300 MG/ML  SOLN COMPARISON:  CT abdomen pelvis 05/31/2019 FINDINGS: Lower chest: Multifocal consolidation in the visualized bilateral lung bases suspicious for infection. No pleural effusion. Small hiatal hernia. Hepatobiliary:  No focal liver abnormality is seen. No gallstones or gallbladder wall thickening prominent common bile duct measuring 1.2 cm in diameter, chronic. No new intra or extrahepatic biliary duct dilation. Pancreas: Unremarkable. No pancreatic ductal dilatation or surrounding inflammatory changes. Spleen: Normal in size without focal abnormality. Adrenals/Urinary Tract: Left greater than right thickening of the adrenal glands favored to represent hyperplasia. There are a few tiny hypodensities in the bilateral kidneys are too small fully characterize. No renal calculi or hydronephrosis. Bladder is unremarkable. Stomach/Bowel: Stomach is within normal limits. Appendix is not definitely visualized and may be surgically absent. No evidence of bowel wall thickening, distention, or inflammatory changes. Vascular/Lymphatic: Moderate aortoiliac atherosclerotic calcification without evidence of aneurysm. No enlarged abdominal or pelvic lymph nodes. Reproductive: Status post hysterectomy. Other: No abdominal wall hernia or abnormality. No abdominopelvic ascites. Musculoskeletal: No acute osseous abnormality. Degenerative changes at the pubic symphysis. IMPRESSION: 1. Multiple areas of consolidation in the visualized bilateral lung bases suspicious for multifocal pneumonia. 2. No acute finding in the abdomen or pelvis. Aortic Atherosclerosis (ICD10-I70.0). Electronically Signed   By: Audie Pinto M.D.   On: 08/28/2019 13:51   Dg Chest Port 1 View  Result Date: 08/28/2019 CLINICAL DATA:   Body aches EXAM: PORTABLE CHEST 1 VIEW COMPARISON:  06/22/2018 FINDINGS: Increased opacity at the right lung base. Cardiomediastinal contours are normal. No pneumothorax or sizable pleural effusion. IMPRESSION: Increased opacity at the right lung base may indicate developing consolidation. Electronically Signed   By: Ulyses Jarred M.D.   On: 08/28/2019 07:00    Assessment/Plan: Principal Problem:   Acute respiratory disease due to COVID-19 virus Active Problems:   Essential hypertension   GERD   GAD (generalized anxiety disorder)   Prediabetes    This patient was discussed with the ED physician, including pertinent vitals, physical exam findings, labs, and imaging.  We also discussed care given by the ED provider.  1. Acute respiratory disease due to COVID 19 virus a. Admit to [employer] b. Telemetry monitoring c. Remdesivir per pharmacy consult - discussed that this is an expirimental medication and is not FDA approved, but that it has shown to be quite effective for many people in the treatment of COVID 19. Discussed that it may have side effects, such as LFT elevation. d. Add steroids e. Continue daily labs: CMP, CRP, CBC, Procalcitonin, DDimer, Ferritin, Magnesium f. Recommended that close contacts be tested 2. Prediabetes/diabetes a. SSI and CBGs, especially given will be on steroids.  3. Hypertension a. Continue home medications 4. GERD a. Protonix 5. Anxiety  DVT prophylaxis: lovenox Consultants: none Code Status: full Family Communication: husband  Disposition Plan: should be able to return home following admission.   Truett Mainland, DO

## 2019-08-28 NOTE — ED Provider Notes (Signed)
[facility] EMERGENCY DEPARTMENT Provider Note   CSN: [id number] Arrival date & time: 08/28/19  0607     History   Chief Complaint Chief Complaint  Patient presents with   Generalized Body Aches    HPI Sophia [name] is a 68 y.o. female.     Patient with complaint for the past 3 days of body aches nausea vomiting low-grade fever.  No family members are sick with any upper respiratory infection no known exposure to COVID-19.  Patient's past medical history significant for type 2 diabetes hypercholesterol hypertension.  Patient states that she has lower abdominal discomfort bilaterally.  Denies dysuria.  No cough.  Does have nausea and vomiting.  And diarrhea.  No blood in either.      Past Medical History:  Diagnosis Date   Anxiety    Arthritis    Depression    Diabetes mellitus type 2 in obese ([place]) 11/28/2010   Qualifier: Diagnosis of  By: Moshe Cipro MD, Margaret  Diet controlled in 12/2016    Diabetes mellitus without complication ([place])    GERD (gastroesophageal reflux disease)    Hypercholesteremia    Hypertension    PONV (postoperative nausea and vomiting)     Patient Active Problem List   Diagnosis Date Noted   Prediabetes 06/01/2019   Current nicotine use 06/01/2019   GAD (generalized anxiety disorder) 12/03/2018   Skin ulcer ([place]) 10/08/2018   Chest pain 06/22/2018   Leg cramps 06/15/2018   Panic attack 06/15/2018   Annual physical exam 11/14/2015   Tubular adenoma of colon 04/28/2014   History of rectal bleeding 02/19/2014   Anemia 02/17/2014   Back pain with radiation 10/05/2013   Seasonal allergies 05/01/2011   Palpitations 05/17/2009   Obesity (BMI 30-39.9) 02/24/2009   Hyperlipidemia with target LDL less than 100 06/17/2008   Depression with anxiety 11/21/2006   Essential hypertension 11/21/2006   GERD 11/21/2006    Past Surgical History:  Procedure Laterality Date   ABDOMINAL HYSTERECTOMY  1982   tubes and womb,  bleeding and ectopic   ABDOMINAL SURGERY     removal of tumors   BREAST SURGERY Left 1983   benign tumor   CATARACT EXTRACTION W/PHACO Right 01/11/2014   Procedure: CATARACT EXTRACTION PHACO AND INTRAOCULAR LENS PLACEMENT ([place]);  Surgeon: Elta Guadeloupe T. Gershon Crane, MD;  Location: [facility];  Service: Ophthalmology;  Laterality: Right;  CDE 5.57   CATARACT EXTRACTION W/PHACO Left 01/25/2014   Procedure: CATARACT EXTRACTION PHACO AND INTRAOCULAR LENS PLACEMENT ([facility]);  Surgeon: Elta Guadeloupe T. Gershon Crane, MD;  Location: [facility];  Service: Ophthalmology;  Laterality: Left;  CDE:5.19   COLONOSCOPY  2007 [facility]   NL EXAM   COLONOSCOPY N/A 04/22/2014   Dr. Fields:Normal mucosa in the terminal ileum/Two COLON polyps REMOVED/ Mild diverticulosis in the ascending colon and transverse colon/The LEFT colon IS redundant/Small internal hemorrhoids. Path: tubular adenoma. Next colonoscopy in 5-10 years   ESOPHAGOGASTRODUODENOSCOPY N/A 04/22/2014   Dr. Fields:MICROCYTIC ANEMIA MOST LIKELY DUE TO ASA/VOLTAREN/Small hiatal hernia/MODERATE Non-erosive gastritis. 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 Gershon Crane, MD;  Location: [facility];  Service: Ophthalmology;  Laterality: Right;   YAG LASER APPLICATION Left [id number]   Procedure: YAG LASER APPLICATION;  Surgeon: Rutherford Guys, MD;  Location: [facility];  Service: Ophthalmology;  Laterality:  Left;     OB History   No obstetric history on file.      Home Medications    Prior to Admission medications   Medication Sig Start Date End Date Taking? Authorizing Provider  ALPRAZolam Duanne Moron) 1 MG tablet Take 0.5 to 1 tablet two times weekly for panic and uncontrolled anxiety 06/01/19  Yes Fayrene Helper, MD  amLODipine (NORVASC) 10 MG tablet Take 1 tablet (10 mg total) by mouth daily. 06/01/19  Yes Fayrene Helper, MD  aspirin EC 81 MG tablet Take 81 mg by mouth daily.   Yes [provider]  AZO-CRANBERRY PO Take 2 tablets by mouth 2 (two) times daily as needed (UTI).   Yes [provider]  benazepril-hydrochlorthiazide (LOTENSIN HCT) 20-12.5 MG tablet TAKE 2 TABLETS EVERY DAY 04/07/19  Yes Fayrene Helper, MD  busPIRone (BUSPAR) 5 MG tablet Take 1 tablet (5 mg total) by mouth 3 (three) times daily. 06/01/19  Yes Fayrene Helper, MD  FLUoxetine (PROZAC) 40 MG capsule Take 1 capsule (40 mg total) by mouth daily. 06/01/19  Yes Fayrene Helper, MD  gabapentin (NEURONTIN) 100 MG capsule Take 100 mg by mouth 3 (three) times daily.   Yes [provider]  Astrid Drafts (OMEGA-3) 500 MG CAPS Take 1 capsule by mouth daily.   Yes [provider]  Multiple Vitamin (MULTIVITAMIN WITH MINERALS) TABS tablet Take 1 tablet by mouth daily.   Yes [provider]  pantoprazole (PROTONIX) 40 MG tablet Take 1 tablet (40 mg total) by mouth daily. 06/01/19  Yes Fayrene Helper, MD  pravastatin (PRAVACHOL) 40 MG tablet Take 1 tablet (40 mg total) by mouth every evening. 06/01/19  Yes Fayrene Helper, MD    Family History Family History  Problem Relation Age of Onset   Colon polyps Sister 13   Diabetes Sister    Hypertension Sister    Kidney disease Sister    Arthritis Father    Cancer Father        prostate    Hypertension Father    Diabetes Sister    Hypertension Sister    CAD Paternal Grandmother    Colon cancer Neg Hx       Social History Social History   Tobacco Use   Smoking status: Former Smoker    Packs/day: 0.25    Years: 20.00    Pack years: 5.00    Types: Cigarettes    Quit date: 01/05/1990    Years since quitting: 29.6   Smokeless tobacco: Current User    Types: Snuff  Substance Use Topics   Alcohol use: No   Drug use: Yes    Types: Marijuana     Allergies   Patient has no known allergies.   Review of Systems Review of Systems  Constitutional: Positive for fever. Negative for chills.  HENT: Negative for congestion, rhinorrhea and sore throat.   Eyes: Negative for visual disturbance.  Respiratory: Negative for cough and shortness of breath.   Cardiovascular: Negative for chest pain and leg swelling.  Gastrointestinal: Positive for abdominal pain, diarrhea, nausea and vomiting.  Genitourinary: Negative for dysuria.  Musculoskeletal: Positive for myalgias. Negative for back pain and neck pain.  Skin: Negative for rash.  Neurological: Negative for dizziness, light-headedness and headaches.  Hematological: Does not bruise/bleed easily.  Psychiatric/Behavioral: Negative for confusion.     Physical Exam Updated Vital Signs BP (!) 147/69    Pulse 98    Temp 100 F (37.8 C) (Oral)    Resp (!) 36    Ht 1.753 m (5\' 9" )    Wt 98.4 kg    SpO2 92%    BMI 32.05 kg/m   Physical Exam Vitals signs and nursing note reviewed.  Constitutional:      General: She is not in acute distress.    Appearance: Normal appearance. She is well-developed. She is not ill-appearing.  HENT:     Head: Normocephalic and atraumatic.  Eyes:     Extraocular Movements: Extraocular movements intact.     Conjunctiva/sclera: Conjunctivae normal.     Pupils: Pupils are equal, round, and reactive to light.  Neck:     Musculoskeletal: Neck supple.  Cardiovascular:     Rate and Rhythm: Normal rate and regular rhythm.     Heart sounds: No murmur.  Pulmonary:     Effort: Pulmonary effort is normal. No  respiratory distress.     Breath sounds: Normal breath sounds.  Abdominal:     Palpations: Abdomen is soft.     Tenderness: There is no abdominal tenderness.  Skin:    General: Skin is warm and dry.  Neurological:     General: No focal deficit present.     Mental Status: She is alert and oriented to person, place, and time.      ED Treatments / Results  Labs (all labs ordered are listed, but only abnormal results are displayed) Labs Reviewed  SARS CORONAVIRUS 2 BY RT PCR ([place], [place] [facility]) - Abnormal; Notable for the following components:      Result Value   SARS Coronavirus 2 POSITIVE (*)    All other components within normal limits  COMPREHENSIVE METABOLIC PANEL - Abnormal; Notable for the following components:   Potassium 3.0 (*)    Chloride 94 (*)    Glucose, Bld 122 (*)    Total Protein 8.2 (*)    All other components within normal limits  CBC WITH DIFFERENTIAL/PLATELET - Abnormal; Notable for the following components:   MCH 25.5 (*)    All other components within normal limits  URINALYSIS, ROUTINE W REFLEX MICROSCOPIC - Abnormal; Notable for the following components:   Color, Urine AMBER (*)    Hgb urine dipstick SMALL (*)    Ketones, ur 5 (*)    Protein, ur >=300 (*)    Nitrite POSITIVE (*)    All other components within normal limits  LACTATE DEHYDROGENASE - Abnormal; Notable for the following components:   LDH 249 (*)    All other components within normal limits  FERRITIN - Abnormal; Notable for the following components:   Ferritin 555 (*)    All other components within normal limits  FIBRINOGEN - Abnormal; Notable for the following components:   Fibrinogen 594 (*)    All other components within normal limits  C-REACTIVE PROTEIN - Abnormal; Notable for the following components:   CRP 8.5 (*)    All other components within normal limits  CULTURE, BLOOD (ROUTINE X 2)  CULTURE, BLOOD (ROUTINE X 2)  URINE CULTURE  D-DIMER,  QUANTITATIVE (NOT AT [facility])  LACTIC ACID, PLASMA  LACTIC ACID, PLASMA  PROCALCITONIN  TRIGLYCERIDES  CBC WITH DIFFERENTIAL/PLATELET    EKG EKG Interpretation  Date/Time:  Saturday August 28 2019 06:23:30 EDT Ventricular Rate:  72 PR Interval:    QRS Duration: 92 QT Interval:  390 QTC Calculation: 427 R Axis:   72 Text Interpretation:  Sinus rhythm Normal ECG No significant change since last tracing 22 Jun 2018 Confirmed by Rolland Porter [phone]) on 08/28/2019 7:09:07 AM   Radiology Ct Abdomen Pelvis W Contrast  Result Date: 08/28/2019 CLINICAL DATA:  Per ED orders.Patient presents with low-grade fever, loss of taste and smell, vomiting and diarrhea for the past 3 days. Hx diabetes Diet controlled, htn/ bj COVID positive. EXAM: CT ABDOMEN AND PELVIS WITH CONTRAST TECHNIQUE: Multidetector CT imaging of the abdomen and pelvis was performed using the standard protocol following bolus administration of intravenous contrast. CONTRAST:  148mL OMNIPAQUE IOHEXOL 300 MG/ML  SOLN COMPARISON:  CT abdomen pelvis 05/31/2019 FINDINGS: Lower chest: Multifocal consolidation in the visualized bilateral lung bases suspicious for infection. No pleural effusion. Small hiatal hernia. Hepatobiliary: No focal liver abnormality is seen. No gallstones or gallbladder wall thickening prominent common bile duct measuring 1.2 cm in diameter, chronic. No new intra or extrahepatic biliary duct dilation. Pancreas: Unremarkable. No pancreatic ductal dilatation or surrounding inflammatory changes. Spleen: Normal in size without focal abnormality. Adrenals/Urinary Tract: Left greater than right thickening of the adrenal glands favored to represent hyperplasia. There are a few tiny hypodensities in the bilateral kidneys are too small fully characterize. No renal calculi or hydronephrosis. Bladder is unremarkable. Stomach/Bowel: Stomach is within normal limits. Appendix is not definitely visualized and may be surgically absent. No  evidence of bowel wall thickening, distention, or inflammatory changes. Vascular/Lymphatic: Moderate aortoiliac atherosclerotic calcification without evidence of aneurysm. No enlarged abdominal or pelvic lymph nodes. Reproductive: Status post hysterectomy. Other: No abdominal wall hernia or abnormality. No abdominopelvic ascites. Musculoskeletal: No acute osseous abnormality. Degenerative changes at the pubic symphysis. IMPRESSION: 1. Multiple areas of consolidation in the visualized bilateral lung bases suspicious for multifocal pneumonia. 2. No acute finding in the abdomen or pelvis. Aortic Atherosclerosis (ICD10-I70.0). Electronically Signed   By: Audie Pinto M.D.   On: 08/28/2019 13:51   Dg Chest Port 1 View  Result Date: 08/28/2019 CLINICAL DATA:  Body aches EXAM: PORTABLE CHEST 1 VIEW COMPARISON:  06/22/2018 FINDINGS: Increased opacity at the right lung base. Cardiomediastinal contours are normal. No pneumothorax or sizable pleural effusion. IMPRESSION: Increased opacity at the right lung base may indicate developing consolidation. Electronically Signed   By: Ulyses Jarred M.D.   On: 08/28/2019 07:00    Procedures Procedures (including critical care time)  CRITICAL CARE Performed by: Fredia Sorrow Total critical care time: 30 minutes Critical care time was exclusive of separately billable procedures and treating other patients. Critical care was necessary to treat or prevent imminent or life-threatening deterioration. Critical care was time spent personally by me on the following activities: development of treatment plan with patient and/or surrogate as well as nursing, discussions with consultants, evaluation of patient's response to treatment, examination of patient, obtaining history from patient or surrogate, ordering and performing treatments and interventions, ordering and review of laboratory studies, ordering and review of radiographic studies, pulse oximetry and re-evaluation  of patient's condition.   Medications Ordered in ED Medications  0.9 %  sodium chloride infusion ( Intravenous Rate/Dose Change 08/28/19 1120)  sodium chloride 0.9 % bolus 1,000 mL (0 mLs Intravenous Stopped 08/28/19 1001)  cefTRIAXone (  ROCEPHIN) 1 g in sodium chloride 0.9 % 100 mL IVPB (0 g Intravenous Stopped 08/28/19 1118)  azithromycin (ZITHROMAX) 500 mg in sodium chloride 0.9 % 250 mL IVPB (0 mg Intravenous Stopped 08/28/19 1345)  iohexol (OMNIPAQUE) 300 MG/ML solution 100 mL (100 mLs Intravenous Contrast Given 08/28/19 1323)     Initial Impression / Assessment and Plan / ED Course  I have reviewed the triage vital signs and the nursing notes.  Pertinent labs & imaging results that were available during my care of the patient were reviewed by me and considered in my medical decision making (see chart for details).      CT scan of the abdomen without any acute findings.  Chest x-ray consistent with pneumonia.  CT scan cuts consistent with multifocal pneumonia.  COVID testing was positive.  Patient ambulated in the room dropped sats down to 87%.  Will start on 2 L of oxygen contact hospitalist for admission.    Final Clinical Impressions(s) / ED Diagnoses   Final diagnoses:  COVID-19 virus infection  Hypoxia    ED Discharge Orders    None       Fredia Sorrow, MD 08/28/19 [phone]

## 2019-08-28 NOTE — ED Notes (Signed)
Family at bedside. 

## 2019-08-28 NOTE — ED Notes (Signed)
Pt placed on 2L [place]

## 2019-08-28 NOTE — ED Provider Notes (Signed)
MSE was initiated and I personally evaluated the patient and placed orders (if any) at  6:21 AM on August 28, 2019.  The patient appears stable so that the remainder of the MSE may be completed by another provider.  Patient presents with low-grade fever, loss of taste and smell, vomiting and diarrhea for the past 3 days.  Patient was placed in isolation and initial orders were started.  Rolland Porter, MD, Barbette Or, MD 08/28/19 [phone]

## 2019-08-29 ENCOUNTER — Encounter (HOSPITAL_COMMUNITY): Payer: Self-pay

## 2019-08-29 LAB — CBC WITH DIFFERENTIAL/PLATELET
Abs Immature Granulocytes: 0.09 10*3/uL — ABNORMAL HIGH (ref 0.00–0.07)
Basophils Absolute: 0 10*3/uL (ref 0.0–0.1)
Basophils Relative: 0 %
Eosinophils Absolute: 0 10*3/uL (ref 0.0–0.5)
Eosinophils Relative: 0 %
HCT: 37.2 % (ref 36.0–46.0)
Hemoglobin: 11.8 g/dL — ABNORMAL LOW (ref 12.0–15.0)
Immature Granulocytes: 1 %
Lymphocytes Relative: 12 %
Lymphs Abs: 0.8 10*3/uL (ref 0.7–4.0)
MCH: 26 pg (ref 26.0–34.0)
MCHC: 31.7 g/dL (ref 30.0–36.0)
MCV: 82.1 fL (ref 80.0–100.0)
Monocytes Absolute: 0.3 10*3/uL (ref 0.1–1.0)
Monocytes Relative: 4 %
Neutro Abs: 5.6 10*3/uL (ref 1.7–7.7)
Neutrophils Relative %: 83 %
Platelets: 195 10*3/uL (ref 150–400)
RBC: 4.53 MIL/uL (ref 3.87–5.11)
RDW: 14.7 % (ref 11.5–15.5)
WBC: 6.8 10*3/uL (ref 4.0–10.5)
nRBC: 0 % (ref 0.0–0.2)

## 2019-08-29 LAB — GLUCOSE, CAPILLARY
Glucose-Capillary: 107 mg/dL — ABNORMAL HIGH (ref 70–99)
Glucose-Capillary: 110 mg/dL — ABNORMAL HIGH (ref 70–99)
Glucose-Capillary: 109 mg/dL — ABNORMAL HIGH (ref 70–99)
Glucose-Capillary: 166 mg/dL — ABNORMAL HIGH (ref 70–99)
Glucose-Capillary: 134 mg/dL — ABNORMAL HIGH (ref 70–99)
Glucose-Capillary: 132 mg/dL — ABNORMAL HIGH (ref 70–99)

## 2019-08-29 LAB — COMPREHENSIVE METABOLIC PANEL
ALT: 30 U/L (ref 0–44)
AST: 32 U/L (ref 15–41)
Albumin: 3.7 g/dL (ref 3.5–5.0)
Alkaline Phosphatase: 68 U/L (ref 38–126)
Anion gap: 14 (ref 5–15)
BUN: 10 mg/dL (ref 8–23)
CO2: 27 mmol/L (ref 22–32)
Calcium: 8.5 mg/dL — ABNORMAL LOW (ref 8.9–10.3)
Chloride: 97 mmol/L — ABNORMAL LOW (ref 98–111)
Creatinine, Ser: 0.73 mg/dL (ref 0.44–1.00)
GFR calc Af Amer: >60 mL/min (ref >60)
GFR calc non Af Amer: >60 mL/min (ref >60)
Glucose, Bld: 109 mg/dL — ABNORMAL HIGH (ref 70–99)
Potassium: 3 mmol/L — ABNORMAL LOW (ref 3.5–5.1)
Sodium: 138 mmol/L (ref 135–145)
Total Bilirubin: 0.7 mg/dL (ref 0.3–1.2)
Total Protein: 7.4 g/dL (ref 6.5–8.1)

## 2019-08-29 LAB — TYPE AND SCREEN
ABO/RH(D): B POS
Antibody Screen: NEGATIVE

## 2019-08-29 LAB — HIV ANTIBODY (ROUTINE TESTING W REFLEX): HIV Screen 4th Generation wRfx: NONREACTIVE

## 2019-08-29 LAB — FERRITIN: Ferritin: 613 ng/mL — ABNORMAL HIGH (ref 11–307)

## 2019-08-29 LAB — C-REACTIVE PROTEIN: CRP: 17.9 mg/dL — ABNORMAL HIGH (ref <1.0)

## 2019-08-29 LAB — MAGNESIUM: Magnesium: 1.8 mg/dL (ref 1.7–2.4)

## 2019-08-29 LAB — BRAIN NATRIURETIC PEPTIDE: B Natriuretic Peptide: 52.6 pg/mL (ref 0.0–100.0)

## 2019-08-29 LAB — D-DIMER, QUANTITATIVE: D-Dimer, Quant: 0.71 ug/mL-FEU — ABNORMAL HIGH (ref 0.00–0.50)

## 2019-08-29 MED ORDER — BUSPIRONE HCL 5 MG PO TABS
5.0000 mg | ORAL_TABLET | Freq: Three times a day (TID) | ORAL | Status: DC
  Administered 2019-08-29 – 2019-09-03 (×16): 5 mg via ORAL
  Filled 2019-08-29 (×20): qty 1

## 2019-08-29 MED ORDER — INSULIN ASPART 100 UNIT/ML ~~LOC~~ SOLN
0.0000 [IU] | SUBCUTANEOUS | Status: DC
  Administered 2019-08-29 (×2): 2 [IU] via SUBCUTANEOUS
  Administered 2019-08-29 – 2019-08-30 (×2): 3 [IU] via SUBCUTANEOUS
  Administered 2019-08-30 (×4): 2 [IU] via SUBCUTANEOUS
  Administered 2019-08-30 – 2019-08-31 (×2): 3 [IU] via SUBCUTANEOUS
  Administered 2019-08-31 – 2019-09-01 (×4): 2 [IU] via SUBCUTANEOUS
  Administered 2019-09-01: 3 [IU] via SUBCUTANEOUS
  Administered 2019-09-01: 2 [IU] via SUBCUTANEOUS
  Administered 2019-09-01 (×2): 3 [IU] via SUBCUTANEOUS
  Administered 2019-09-01: 2 [IU] via SUBCUTANEOUS
  Administered 2019-09-02: 3 [IU] via SUBCUTANEOUS
  Administered 2019-09-02: 2 [IU] via SUBCUTANEOUS
  Administered 2019-09-02: 3 [IU] via SUBCUTANEOUS

## 2019-08-29 MED ORDER — GABAPENTIN 100 MG PO CAPS
100.0000 mg | ORAL_CAPSULE | Freq: Three times a day (TID) | ORAL | Status: DC
  Administered 2019-08-29 – 2019-09-03 (×16): 100 mg via ORAL
  Filled 2019-08-29 (×16): qty 1

## 2019-08-29 MED ORDER — BENAZEPRIL-HYDROCHLOROTHIAZIDE 20-12.5 MG PO TABS: 2.0000 | ORAL_TABLET | Freq: Every day | ORAL | Status: DC

## 2019-08-29 MED ORDER — GUAIFENESIN-DM 100-10 MG/5ML PO SYRP: 10.0000 mL | ORAL_SOLUTION | ORAL | Status: DC | PRN

## 2019-08-29 MED ORDER — VITAMIN C 500 MG PO TABS
500.0000 mg | ORAL_TABLET | Freq: Every day | ORAL | Status: DC
  Administered 2019-08-29 – 2019-09-03 (×6): 500 mg via ORAL
  Filled 2019-08-29 (×6): qty 1

## 2019-08-29 MED ORDER — SODIUM CHLORIDE 0.9 % IV SOLN
200.0000 mg | Freq: Once | INTRAVENOUS | Status: AC
  Administered 2019-08-29: 200 mg via INTRAVENOUS
  Filled 2019-08-29: qty 40

## 2019-08-29 MED ORDER — POTASSIUM CHLORIDE CRYS ER 20 MEQ PO TBCR
20.0000 meq | EXTENDED_RELEASE_TABLET | Freq: Once | ORAL | Status: AC
  Administered 2019-08-29: 20 meq via ORAL
  Filled 2019-08-29: qty 1

## 2019-08-29 MED ORDER — ONDANSETRON HCL 4 MG PO TABS: 4.0000 mg | ORAL_TABLET | Freq: Four times a day (QID) | ORAL | Status: DC | PRN

## 2019-08-29 MED ORDER — MAGNESIUM SULFATE IN D5W 1-5 GM/100ML-% IV SOLN
1.0000 g | Freq: Once | INTRAVENOUS | Status: AC
  Administered 2019-08-29: 1 g via INTRAVENOUS
  Filled 2019-08-29: qty 100

## 2019-08-29 MED ORDER — POTASSIUM CHLORIDE CRYS ER 20 MEQ PO TBCR
40.0000 meq | EXTENDED_RELEASE_TABLET | Freq: Once | ORAL | Status: AC
  Administered 2019-08-29: 40 meq via ORAL
  Filled 2019-08-29: qty 2

## 2019-08-29 MED ORDER — METHYLPREDNISOLONE SODIUM SUCC 125 MG IJ SOLR
60.0000 mg | INTRAMUSCULAR | Status: DC
  Administered 2019-08-29: 60 mg via INTRAVENOUS
  Filled 2019-08-29: qty 2

## 2019-08-29 MED ORDER — METHYLPREDNISOLONE SODIUM SUCC 40 MG IJ SOLR
40.0000 mg | Freq: Once | INTRAMUSCULAR | Status: AC
  Administered 2019-08-29: 40 mg via INTRAVENOUS
  Filled 2019-08-29: qty 1

## 2019-08-29 MED ORDER — FLUOXETINE HCL 20 MG PO CAPS
40.0000 mg | ORAL_CAPSULE | Freq: Every day | ORAL | Status: DC
  Administered 2019-08-29 – 2019-09-03 (×6): 40 mg via ORAL
  Filled 2019-08-29 (×6): qty 2

## 2019-08-29 MED ORDER — AMLODIPINE BESYLATE 10 MG PO TABS
10.0000 mg | ORAL_TABLET | Freq: Every day | ORAL | Status: DC
  Administered 2019-08-29 – 2019-09-03 (×6): 10 mg via ORAL
  Filled 2019-08-29 (×3): qty 2
  Filled 2019-08-29 (×3): qty 1

## 2019-08-29 MED ORDER — ZINC SULFATE 220 (50 ZN) MG PO CAPS
220.0000 mg | ORAL_CAPSULE | Freq: Every day | ORAL | Status: DC
  Administered 2019-08-29 – 2019-09-03 (×6): 220 mg via ORAL
  Filled 2019-08-29 (×6): qty 1

## 2019-08-29 MED ORDER — HYDROCHLOROTHIAZIDE 25 MG PO TABS
25.0000 mg | ORAL_TABLET | Freq: Every day | ORAL | Status: DC
  Administered 2019-08-29 – 2019-09-03 (×6): 25 mg via ORAL
  Filled 2019-08-29 (×6): qty 1

## 2019-08-29 MED ORDER — ONDANSETRON HCL 4 MG/2ML IJ SOLN: 4.0000 mg | Freq: Four times a day (QID) | INTRAMUSCULAR | Status: DC | PRN

## 2019-08-29 MED ORDER — ASPIRIN EC 81 MG PO TBEC
81.0000 mg | DELAYED_RELEASE_TABLET | Freq: Every day | ORAL | Status: DC
  Administered 2019-08-29 – 2019-09-03 (×6): 81 mg via ORAL
  Filled 2019-08-29 (×6): qty 1

## 2019-08-29 MED ORDER — SODIUM CHLORIDE 0.9 % IV SOLN
100.0000 mg | INTRAVENOUS | Status: AC
  Administered 2019-08-30 – 2019-09-02 (×4): 100 mg via INTRAVENOUS
  Filled 2019-08-29 (×4): qty 20

## 2019-08-29 MED ORDER — PANTOPRAZOLE SODIUM 40 MG PO TBEC
40.0000 mg | DELAYED_RELEASE_TABLET | Freq: Every day | ORAL | Status: DC
  Administered 2019-08-29 – 2019-09-03 (×6): 40 mg via ORAL
  Filled 2019-08-29 (×6): qty 1

## 2019-08-29 MED ORDER — HYDROCOD POLST-CPM POLST ER 10-8 MG/5ML PO SUER
5.0000 mL | Freq: Two times a day (BID) | ORAL | Status: DC | PRN
  Administered 2019-09-01: 5 mL via ORAL
  Filled 2019-08-29: qty 5

## 2019-08-29 MED ORDER — ENOXAPARIN SODIUM 40 MG/0.4ML ~~LOC~~ SOLN
40.0000 mg | SUBCUTANEOUS | Status: DC
  Administered 2019-08-29 – 2019-09-03 (×6): 40 mg via SUBCUTANEOUS
  Filled 2019-08-29 (×6): qty 0.4

## 2019-08-29 MED ORDER — BENAZEPRIL HCL 20 MG PO TABS
40.0000 mg | ORAL_TABLET | Freq: Every day | ORAL | Status: DC
  Administered 2019-08-29 – 2019-09-03 (×6): 40 mg via ORAL
  Filled 2019-08-29 (×4): qty 2
  Filled 2019-08-29: qty 1
  Filled 2019-08-29 (×2): qty 2

## 2019-08-29 MED ORDER — DEXAMETHASONE 6 MG PO TABS
6.0000 mg | ORAL_TABLET | Freq: Every day | ORAL | Status: DC
  Administered 2019-08-29: 6 mg via ORAL
  Filled 2019-08-29: qty 1

## 2019-08-29 MED ORDER — PRAVASTATIN SODIUM 40 MG PO TABS
40.0000 mg | ORAL_TABLET | Freq: Every evening | ORAL | Status: DC
  Administered 2019-08-29 – 2019-09-02 (×5): 40 mg via ORAL
  Filled 2019-08-29 (×5): qty 1

## 2019-08-29 MED ORDER — ALPRAZOLAM 0.5 MG PO TABS
0.5000 mg | ORAL_TABLET | Freq: Two times a day (BID) | ORAL | Status: DC | PRN
  Administered 2019-08-30 – 2019-08-31 (×2): 0.5 mg via ORAL
  Filled 2019-08-29 (×2): qty 1

## 2019-08-29 MED ORDER — PHENOL 1.4 % MT LIQD
1.0000 | OROMUCOSAL | Status: DC | PRN
  Administered 2019-08-29: 1 via OROMUCOSAL
  Filled 2019-08-29: qty 177

## 2019-08-29 NOTE — TOC Initial Note (Signed)
Transition of Care [facility]) - Initial/Assessment Note    Patient Details  Name: Sophia [name] MRN: [id number] Date of Birth: 11-Dec-1950  [facility] CM/SW Contact:    Ninfa Meeker, RN Phone Number: 08/29/2019, 1:25 PM  Clinical Narrative: Patient is a 68 y.o. female with a history of diabetes type 2, hypertension, anxiety, depression, GERD.  Patient has generalized body aches, mild cough, elevated body temperature, diminished taste and smell that started a 3 days ago. She does have a possible COVID contact: Her neighbors mother-in-law came down from [place] in September for a wedding, and later tested COVID positive. Patient is on 2 LNC, started on Remdesivir. CM will continue to monitor for needs as patient medically improve, may she be blessed to do so.                    Patient Goals and CMS Choice        Expected Discharge Plan and Services                                                Prior Living Arrangements/Services                       Activities of Daily Living Home Assistive Devices/Equipment: None ADL Screening (condition at time of admission) Patient's cognitive ability adequate to safely complete daily activities?: Yes Is the patient deaf or have difficulty hearing?: No Does the patient have difficulty seeing, even when wearing glasses/contacts?: No Does the patient have difficulty concentrating, remembering, or making decisions?: No Patient able to express need for assistance with ADLs?: Yes Does the patient have difficulty dressing or bathing?: No Independently performs ADLs?: Yes (appropriate for developmental age) Does the patient have difficulty walking or climbing stairs?: No Weakness of Legs: None Weakness of Arms/Hands: None  Permission Sought/Granted                  Emotional Assessment              Admission diagnosis:  Hypoxia [R09.02] COVID-19 virus infection [U07.1] Patient Active  Problem List   Diagnosis Date Noted  . Acute respiratory disease due to COVID-19 virus 08/28/2019  . Prediabetes 06/01/2019  . Current nicotine use 06/01/2019  . GAD (generalized anxiety disorder) 12/03/2018  . Skin ulcer ([place]) 10/08/2018  . Chest pain 06/22/2018  . Leg cramps 06/15/2018  . Panic attack 06/15/2018  . Annual physical exam 11/14/2015  . Tubular adenoma of colon 04/28/2014  . History of rectal bleeding 02/19/2014  . Anemia 02/17/2014  . Back pain with radiation 10/05/2013  . Seasonal allergies 05/01/2011  . Palpitations 05/17/2009  . Obesity (BMI 30-39.9) 02/24/2009  . Hyperlipidemia with target LDL less than 100 06/17/2008  . Depression with anxiety 11/21/2006  . Essential hypertension 11/21/2006  . GERD 11/21/2006   PCP:  Fayrene Helper, MD Pharmacy:   [place], [address] [place] [address] Phone: [phone] Fax: [phone]  [facility] [place], [place] [address] Phone: [phone] Fax: [phone]     Social Determinants of Health (SDOH) Interventions    Readmission Risk Interventions No flowsheet data found.

## 2019-08-29 NOTE — Plan of Care (Signed)
  Problem: Education: Goal: Knowledge of risk factors and measures for prevention of condition will improve Outcome: Adequate for Discharge   Problem: Coping: Goal: Psychosocial and spiritual needs will be supported Outcome: Adequate for Discharge   Problem: Respiratory: Goal: Will maintain a patent airway Outcome: Completed/Met Goal: Complications related to the disease process, condition or treatment will be avoided or minimized Outcome: Progressing

## 2019-08-29 NOTE — ED Notes (Signed)
[facility] at bedside loading pt at this time

## 2019-08-29 NOTE — Progress Notes (Signed)
Pharmacy Antibiotic Note  Sophia [name] is a 68 y.o. female admitted on 08/28/2019 with COVID 19.  Pharmacy has been consulted for Remdesivir dosing.  CXR shows increased opacity at the right lung base may indicate developing consolidation. ALT 32  Plan: Remdesivir 200mg  IV now then 100mg  IV daily x 4 days Will f/u ALT and pt's clinical condition   Height: 5\' 9"  (175.3 cm) Weight: 217 lb (98.4 kg) IBW/kg (Calculated) : 66.2  Temp (24hrs), Avg:100 F (37.8 C), Min:100 F (37.8 C), Max:100 F (37.8 C)  Recent Labs  Lab 08/28/19 0747 08/28/19 0928  WBC 5.3  --   CREATININE 0.79  --   LATICACIDVEN 1.3 0.8    Estimated Creatinine Clearance: 82.9 mL/min (by C-G formula based on SCr of 0.79 mg/dL).    No Known Allergies  Antimicrobials this admission: 10/11 Remdesivir>>10/15  Microbiology results: 10/10 BCx:  10/10 UCx:    Thank you for allowing pharmacy to be a part of this patient's care.  Sherlon Handing, PharmD, BCPS CGV Clinical pharmacist phone [phone] 08/29/2019 1:06 AM

## 2019-08-29 NOTE — Plan of Care (Signed)

## 2019-08-29 NOTE — Progress Notes (Signed)
PROGRESS NOTE                                                                                                                                                                                                             Patient Demographics:    Sophia [name], is a 68 y.o. adult, DOB - 1951-03-29, ZOX:[id number]  Outpatient Primary MD for the patient is Fayrene Helper, MD    LOS - 1  Admit date - 08/28/2019    Chief Complaint  Patient presents with   Generalized Body Aches       Brief Narrative - Sophia [name] is a 68 y.o. female with a history of diabetes type 2, hypertension, anxiety, depression, GERD.  Patient seen for generalized body aches, mild cough, elevated body temperature, diminished taste and smell that started 3 days prior to her hospital visit and admission.  In the ER she was diagnosed with COVID-19 pneumonitis and admitted.   Subjective:    Sophia [name] today has, No headache, No chest pain, No abdominal pain - No Nausea, No new weakness tingling or numbness, mild cough but much improved shortness of breath.   Assessment  & Plan :     1. Acute Hypoxic Resp. Failure due to Acute Covid 19 Viral Pneumonitis during the ongoing 2020 Covid 19 Pandemic - she is symptom-free at rest, inflammatory markers are pending but clinically looks stable, continue on steroids and Remdisvir combination.  Encouraged to sit up in chair in the daytime use flutter valve and I-S for pulmonary toiletry and prone in bed at night.  She will be monitored closely.  Actemra off label use - patient was told that if COVID-19 pneumonitis gets worse we might potentially use Actemra off label, she denies any known history of tuberculosis or hepatitis, understands the risks and benefits and wants to proceed with Actemra treatment if required.  She is also been consented for convalescent plasma if needed on 08/29/2019.   COVID-19  Labs  Recent Labs    08/28/19 0747 08/28/19 1108  DDIMER 0.49  --   FERRITIN  --  555*  LDH 249*  --   CRP  --  8.5*    Lab Results  Component Value Date   SARSCOV2NAA POSITIVE (A) 08/28/2019     Hepatic Function Latest Ref Rng & Units 08/28/2019 05/24/2019  05/01/2019  Total Protein 6.5 - 8.1 g/dL 8.2(H) 7.2 7.7  Albumin 3.5 - 5.0 g/dL 3.9 - 4.3  AST 15 - 41 U/L 32 17 20  ALT 0 - 44 U/L 32 22 26  Alk Phosphatase 38 - 126 U/L 70 - 60  Total Bilirubin 0.3 - 1.2 mg/dL 0.4 0.4 0.4  Bilirubin, Direct 0.0 - 0.3 mg/dL - - -        Component Value Date/Time   BNP 14.0 06/22/2018 1632      2.  Essential hypertension.  Stable on Norvasc, ACE and HCTZ combination.  3.  GERD.  On PPI.  4.  Diabetic peripheral neuropathy.  Stable on Neurontin.  5. Pre DM2.  Follow with PCP.  On SSI here due to steroids.  CBG (last 3)  Recent Labs    08/29/19 0131 08/29/19 0353 08/29/19 0702  GLUCAP 107* 110* 109*       Condition - Fair  Family Communication  :  None  Code Status :  Full  Diet :   Diet Order            Diet Heart Room service appropriate? Yes; Fluid consistency: Thin  Diet effective now               Disposition Plan  :  Inpt  Consults  :  None  Procedures  :     CT -  1. Multiple areas of consolidation in the visualized bilateral lung bases suspicious for multifocal pneumonia. 2. No acute finding in the abdomen or pelvis. Aortic Atherosclerosis   PUD Prophylaxis : PPI  DVT Prophylaxis  :  Lovenox    Lab Results  Component Value Date   PLT 203 08/28/2019    Inpatient Medications  Scheduled Meds:  amLODipine  10 mg Oral Daily   aspirin EC  81 mg Oral Daily   benazepril  40 mg Oral Daily   And   hydrochlorothiazide  25 mg Oral Daily   busPIRone  5 mg Oral TID   dexamethasone  6 mg Oral Q0600   enoxaparin (LOVENOX) injection  40 mg Subcutaneous Q24H   FLUoxetine  40 mg Oral Daily   gabapentin  100 mg Oral TID   insulin aspart   0-15 Units Subcutaneous Q4H   pantoprazole  40 mg Oral Daily   pravastatin  40 mg Oral QPM   vitamin C  500 mg Oral Daily   zinc sulfate  220 mg Oral Daily   Continuous Infusions:  sodium chloride Stopped (08/29/19 0003)   [START ON 08/30/2019] remdesivir 100 mg in NS 250 mL     PRN Meds:.acetaminophen, ALPRAZolam, chlorpheniramine-HYDROcodone, guaiFENesin-dextromethorphan, ondansetron **OR** ondansetron (ZOFRAN) IV  Antibiotics  :    Anti-infectives (From admission, onward)   Start     Dose/Rate Route Frequency Ordered Stop   08/30/19 1000  remdesivir 100 mg in sodium chloride 0.9 % 250 mL IVPB     100 mg 500 mL/hr over 30 Minutes Intravenous Every 24 hours 08/29/19 0105 09/03/19 0959   08/29/19 0200  remdesivir 200 mg in sodium chloride 0.9 % 250 mL IVPB     200 mg 500 mL/hr over 30 Minutes Intravenous Once 08/29/19 0105 08/29/19 0233   08/28/19 1000  cefTRIAXone (ROCEPHIN) 1 g in sodium chloride 0.9 % 100 mL IVPB     1 g 200 mL/hr over 30 Minutes Intravenous  Once 08/28/19 0950 08/28/19 1118   08/28/19 1000  azithromycin (ZITHROMAX) 500 mg in sodium chloride  0.9 % 250 mL IVPB     500 mg 250 mL/hr over 60 Minutes Intravenous  Once 08/28/19 0950 08/28/19 1345       Time Spent in minutes  30   Lala Lund M.D on 08/29/2019 at 10:24 AM  To page go to [email or web address] - password [facility]  [facility] -  Office  [phone]     See all Orders from today for further details    Objective:   Vitals:   08/28/19 2330 08/29/19 0100 08/29/19 0357 08/29/19 0800  BP: (!) 119/58 126/74 (!) 143/65 (!) 146/57  Pulse: 60 87 77 85  Resp: '17 15 18 ' (!) 21  Temp:  99.4 F (37.4 C) 98.9 F (37.2 C) (!) 100.9 F (38.3 C)  TempSrc:  Oral Oral Oral  SpO2: 100% 94% 94% 91%  Weight:  86.2 kg    Height:  '5\' 9"'  (1.753 m)      Wt Readings from Last 3 Encounters:  08/29/19 86.2 kg  07/14/19 96.2 kg  06/01/19 89.4 kg     Intake/Output Summary (Last 24 hours) at  08/29/2019 1024 Last data filed at 08/29/2019 0400 Gross per 24 hour  Intake 1376.5 ml  Output --  Net 1376.5 ml     Physical Exam  Awake Alert, Oriented X 3, No new F.N deficits, Normal affect [place].AT,PERRAL Supple Neck,No JVD, No cervical lymphadenopathy appriciated.  Symmetrical Chest wall movement, Good air movement bilaterally, CTAB RRR,No Gallops,Rubs or new Murmurs, No Parasternal Heave +ve B.Sounds, Abd Soft, No tenderness, No organomegaly appriciated, No rebound - guarding or rigidity. No Cyanosis, Clubbing or edema, No new Rash or bruise       Data Review:    CBC Recent Labs  Lab 08/28/19 0747  WBC 5.3  HGB 12.5  HCT 40.3  PLT 203  MCV 82.2  MCH 25.5*  MCHC 31.0  RDW 14.7  LYMPHSABS 1.1  MONOABS 0.5  EOSABS 0.0  BASOSABS 0.0    Chemistries  Recent Labs  Lab 08/28/19 0747  NA 137  K 3.0*  CL 94*  CO2 30  GLUCOSE 122*  BUN 16  CREATININE 0.79  CALCIUM 9.0  AST 32  ALT 32  ALKPHOS 70  BILITOT 0.4   ------------------------------------------------------------------------------------------------------------------ Recent Labs    08/28/19 0843  TRIG 98    Lab Results  Component Value Date   HGBA1C 6.1 (H) 05/24/2019   ------------------------------------------------------------------------------------------------------------------ No results for input(s): TSH, T4TOTAL, T3FREE, THYROIDAB in the last 72 hours.  Invalid input(s): FREET3  Cardiac Enzymes No results for input(s): CKMB, TROPONINI, MYOGLOBIN in the last 168 hours.  Invalid input(s): CK ------------------------------------------------------------------------------------------------------------------    Component Value Date/Time   BNP 14.0 06/22/2018 1632    Micro Results Recent Results (from the past 240 hour(s))  Culture, blood (routine x 2)     Status: None (Preliminary result)   Collection Time: 08/28/19  7:47 AM   Specimen: BLOOD RIGHT FOREARM  Result Value Ref  Range Status   Specimen Description BLOOD RIGHT FOREARM  Final   Special Requests   Final    BOTTLES DRAWN AEROBIC AND ANAEROBIC Blood Culture adequate volume Performed at [facility], [address] [place], [place] [address]    Culture PENDING  Incomplete   Report Status PENDING  Incomplete  SARS Coronavirus 2 by RT PCR (hospital order, performed in [place] hospital lab) Nasopharyngeal Nasopharyngeal Swab     Status: Abnormal   Collection Time: 08/28/19  7:47 AM   Specimen: Nasopharyngeal Swab  Result Value Ref Range Status   SARS Coronavirus 2 POSITIVE (A) NEGATIVE Final    Comment: RESULT CALLED TO, READ BACK BY AND VERIFIED WITH: HOLCOLM,R'@1058'  BY MATTHEWS, B 10.10.2020 (NOTE) If result is NEGATIVE SARS-CoV-2 target nucleic acids are NOT DETECTED. The SARS-CoV-2 RNA is generally detectable in upper and lower  respiratory specimens during the acute phase of infection. The lowest  concentration of SARS-CoV-2 viral copies this assay can detect is 250  copies / mL. A negative result does not preclude SARS-CoV-2 infection  and should not be used as the sole basis for treatment or other  patient management decisions.  A negative result may occur with  improper specimen collection / handling, submission of specimen other  than nasopharyngeal swab, presence of viral mutation(s) within the  areas targeted by this assay, and inadequate number of viral copies  (<250 copies / mL). A negative result must be combined with clinical  observations, patient history, and epidemiological information. If result is POSITIVE SARS-CoV-2 target nucleic acids are DETECTE D. The SARS-CoV-2 RNA is generally detectable in upper and lower  respiratory specimens during the acute phase of infection.  Positive  results are indicative of active infection with SARS-CoV-2.  Clinical  correlation with patient history and other diagnostic information is  necessary to determine patient infection status.   Positive results do  not rule out bacterial infection or co-infection with other viruses. If result is PRESUMPTIVE POSTIVE SARS-CoV-2 nucleic acids MAY BE PRESENT.   A presumptive positive result was obtained on the submitted specimen  and confirmed on repeat testing.  While 2019 novel coronavirus  (SARS-CoV-2) nucleic acids may be present in the submitted sample  additional confirmatory testing may be necessary for epidemiological  and / or clinical management purposes  to differentiate between  SARS-CoV-2 and other Sarbecovirus currently known to infect humans.  If clinically indicated additional testing with an alternate test  methodology (LAB745 3) is advised. The SARS-CoV-2 RNA is generally  detectable in upper and lower respiratory specimens during the acute  phase of infection. The expected result is Negative. Fact Sheet for Patients:  StrictlyIdeas.no Fact Sheet for Healthcare Providers: BankingDealers.co.za This test is not yet approved or cleared by the [place] FDA and has been authorized for detection and/or diagnosis of SARS-CoV-2 by FDA under an Emergency Use Authorization (EUA).  This EUA will remain in effect (meaning this test can be used) for the duration of the COVID-19 declaration under Section 564(b)(1) of the Act, 21 U.S.C. section 360bbb-3(b)(1), unless the authorization is terminated or revoked sooner. Performed at [facility], [address] [place], [place] [address]   Culture, blood (routine x 2)     Status: None (Preliminary result)   Collection Time: 08/28/19  8:02 AM   Specimen: Left Antecubital; Blood  Result Value Ref Range Status   Specimen Description LEFT ANTECUBITAL  Final   Special Requests   Final    BOTTLES DRAWN AEROBIC AND ANAEROBIC Blood Culture adequate volume Performed at [facility], [address], [place], [place] [address]    Culture PENDING  Incomplete   Report Status PENDING   Incomplete    Radiology Reports Ct Abdomen Pelvis W Contrast  Result Date: 08/28/2019 CLINICAL DATA:  Per ED orders.Patient presents with low-grade fever, loss of taste and smell, vomiting and diarrhea for the past 3 days. Hx diabetes Diet controlled, htn/ bj COVID positive. EXAM: CT ABDOMEN AND PELVIS WITH CONTRAST TECHNIQUE: Multidetector CT imaging of the abdomen and pelvis was  performed using the standard protocol following bolus administration of intravenous contrast. CONTRAST:  122m OMNIPAQUE IOHEXOL 300 MG/ML  SOLN COMPARISON:  CT abdomen pelvis 05/31/2019 FINDINGS: Lower chest: Multifocal consolidation in the visualized bilateral lung bases suspicious for infection. No pleural effusion. Small hiatal hernia. Hepatobiliary: No focal liver abnormality is seen. No gallstones or gallbladder wall thickening prominent common bile duct measuring 1.2 cm in diameter, chronic. No new intra or extrahepatic biliary duct dilation. Pancreas: Unremarkable. No pancreatic ductal dilatation or surrounding inflammatory changes. Spleen: Normal in size without focal abnormality. Adrenals/Urinary Tract: Left greater than right thickening of the adrenal glands favored to represent hyperplasia. There are a few tiny hypodensities in the bilateral kidneys are too small fully characterize. No renal calculi or hydronephrosis. Bladder is unremarkable. Stomach/Bowel: Stomach is within normal limits. Appendix is not definitely visualized and may be surgically absent. No evidence of bowel wall thickening, distention, or inflammatory changes. Vascular/Lymphatic: Moderate aortoiliac atherosclerotic calcification without evidence of aneurysm. No enlarged abdominal or pelvic lymph nodes. Reproductive: Status post hysterectomy. Other: No abdominal wall hernia or abnormality. No abdominopelvic ascites. Musculoskeletal: No acute osseous abnormality. Degenerative changes at the pubic symphysis. IMPRESSION: 1. Multiple areas of consolidation  in the visualized bilateral lung bases suspicious for multifocal pneumonia. 2. No acute finding in the abdomen or pelvis. Aortic Atherosclerosis (ICD10-I70.0). Electronically Signed   By: NAudie PintoM.D.   On: 08/28/2019 13:51   Dg Chest Port 1 View  Result Date: 08/28/2019 CLINICAL DATA:  Body aches EXAM: PORTABLE CHEST 1 VIEW COMPARISON:  06/22/2018 FINDINGS: Increased opacity at the right lung base. Cardiomediastinal contours are normal. No pneumothorax or sizable pleural effusion. IMPRESSION: Increased opacity at the right lung base may indicate developing consolidation. Electronically Signed   By: KUlyses JarredM.D.   On: 08/28/2019 07:00

## 2019-08-30 LAB — C-REACTIVE PROTEIN: CRP: 16.8 mg/dL — ABNORMAL HIGH (ref <1.0)

## 2019-08-30 LAB — CBC WITH DIFFERENTIAL/PLATELET
Abs Immature Granulocytes: 0.03 10*3/uL (ref 0.00–0.07)
Basophils Absolute: 0 10*3/uL (ref 0.0–0.1)
Basophils Relative: 0 %
Eosinophils Absolute: 0 10*3/uL (ref 0.0–0.5)
Eosinophils Relative: 0 %
HCT: 38.1 % (ref 36.0–46.0)
Hemoglobin: 11.9 g/dL — ABNORMAL LOW (ref 12.0–15.0)
Immature Granulocytes: 1 %
Lymphocytes Relative: 17 %
Lymphs Abs: 0.9 10*3/uL (ref 0.7–4.0)
MCH: 25.4 pg — ABNORMAL LOW (ref 26.0–34.0)
MCHC: 31.2 g/dL (ref 30.0–36.0)
MCV: 81.4 fL (ref 80.0–100.0)
Monocytes Absolute: 0.2 10*3/uL (ref 0.1–1.0)
Monocytes Relative: 4 %
Neutro Abs: 4.1 10*3/uL (ref 1.7–7.7)
Neutrophils Relative %: 78 %
Platelets: 215 10*3/uL (ref 150–400)
RBC: 4.68 MIL/uL (ref 3.87–5.11)
RDW: 14.7 % (ref 11.5–15.5)
WBC: 5.2 10*3/uL (ref 4.0–10.5)
nRBC: 0 % (ref 0.0–0.2)

## 2019-08-30 LAB — COMPREHENSIVE METABOLIC PANEL
ALT: 28 U/L (ref 0–44)
AST: 29 U/L (ref 15–41)
Albumin: 3.3 g/dL — ABNORMAL LOW (ref 3.5–5.0)
Alkaline Phosphatase: 66 U/L (ref 38–126)
Anion gap: 13 (ref 5–15)
BUN: 17 mg/dL (ref 8–23)
CO2: 28 mmol/L (ref 22–32)
Calcium: 9 mg/dL (ref 8.9–10.3)
Chloride: 100 mmol/L (ref 98–111)
Creatinine, Ser: 0.66 mg/dL (ref 0.44–1.00)
GFR calc Af Amer: >60 mL/min (ref >60)
GFR calc non Af Amer: >60 mL/min (ref >60)
Glucose, Bld: 133 mg/dL — ABNORMAL HIGH (ref 70–99)
Potassium: 3.3 mmol/L — ABNORMAL LOW (ref 3.5–5.1)
Sodium: 141 mmol/L (ref 135–145)
Total Bilirubin: 0.4 mg/dL (ref 0.3–1.2)
Total Protein: 7.4 g/dL (ref 6.5–8.1)

## 2019-08-30 LAB — GLUCOSE, CAPILLARY
Glucose-Capillary: 139 mg/dL — ABNORMAL HIGH (ref 70–99)
Glucose-Capillary: 134 mg/dL — ABNORMAL HIGH (ref 70–99)
Glucose-Capillary: 135 mg/dL — ABNORMAL HIGH (ref 70–99)
Glucose-Capillary: 131 mg/dL — ABNORMAL HIGH (ref 70–99)
Glucose-Capillary: 150 mg/dL — ABNORMAL HIGH (ref 70–99)
Glucose-Capillary: 161 mg/dL — ABNORMAL HIGH (ref 70–99)

## 2019-08-30 LAB — FERRITIN: Ferritin: 683 ng/mL — ABNORMAL HIGH (ref 11–307)

## 2019-08-30 LAB — D-DIMER, QUANTITATIVE: D-Dimer, Quant: 0.65 ug/mL-FEU — ABNORMAL HIGH (ref 0.00–0.50)

## 2019-08-30 LAB — URINE CULTURE: Culture: <10000 — AB

## 2019-08-30 LAB — BRAIN NATRIURETIC PEPTIDE: B Natriuretic Peptide: 40.5 pg/mL (ref 0.0–100.0)

## 2019-08-30 LAB — MAGNESIUM: Magnesium: 2 mg/dL (ref 1.7–2.4)

## 2019-08-30 LAB — ABO/RH: ABO/RH(D): B POS

## 2019-08-30 LAB — LACTATE DEHYDROGENASE: LDH: 278 U/L — ABNORMAL HIGH (ref 98–192)

## 2019-08-30 MED ORDER — POTASSIUM CHLORIDE CRYS ER 20 MEQ PO TBCR
40.0000 meq | EXTENDED_RELEASE_TABLET | Freq: Two times a day (BID) | ORAL | Status: AC
  Administered 2019-08-30 (×2): 40 meq via ORAL
  Filled 2019-08-30 (×2): qty 2

## 2019-08-30 MED ORDER — METHYLPREDNISOLONE SODIUM SUCC 40 MG IJ SOLR
40.0000 mg | Freq: Two times a day (BID) | INTRAMUSCULAR | Status: DC
  Administered 2019-08-30 – 2019-09-02 (×7): 40 mg via INTRAVENOUS
  Filled 2019-08-30 (×7): qty 1

## 2019-08-30 NOTE — Plan of Care (Addendum)
Patient up to chair and side of the bed for most of the day. All medication given well tolerated. Patient currently on room air o2 sats 92-94%. No s/s of pain or distress, will continue to monitor for remainder of shift.  Problem: Education: Goal: Knowledge of risk factors and measures for prevention of condition will improve 08/30/2019 1108 by Orvan Falconer, RN Outcome: Progressing 08/30/2019 1107 by Orvan Falconer, RN Outcome: Progressing   Problem: Coping: Goal: Psychosocial and spiritual needs will be supported 08/30/2019 1108 by Orvan Falconer, RN Outcome: Progressing 08/30/2019 1107 by Orvan Falconer, RN Outcome: Progressing   Problem: Respiratory: Goal: Complications related to the disease process, condition or treatment will be avoided or minimized 08/30/2019 1108 by Orvan Falconer, RN Outcome: Progressing 08/30/2019 1107 by Orvan Falconer, RN Outcome: Progressing   Problem: Education: Goal: Knowledge of General Education information will improve Description: Including pain rating scale, medication(s)/side effects and non-pharmacologic comfort measures 08/30/2019 1108 by Orvan Falconer, RN Outcome: Progressing 08/30/2019 1107 by Orvan Falconer, RN Outcome: Progressing   Problem: Health Behavior/Discharge Planning: Goal: Ability to manage health-related needs will improve 08/30/2019 1108 by Orvan Falconer, RN Outcome: Progressing 08/30/2019 1107 by Orvan Falconer, RN Outcome: Progressing   Problem: Clinical Measurements: Goal: Ability to maintain clinical measurements within normal limits will improve 08/30/2019 1108 by Orvan Falconer, RN Outcome: Progressing 08/30/2019 1107 by Orvan Falconer, RN Outcome: Progressing Goal: Will remain free from infection 08/30/2019 1108 by Orvan Falconer, RN Outcome: Progressing 08/30/2019 1107 by Orvan Falconer, RN Outcome: Progressing Goal: Diagnostic test results will  improve 08/30/2019 1108 by Orvan Falconer, RN Outcome: Progressing 08/30/2019 1107 by Orvan Falconer, RN Outcome: Progressing Goal: Respiratory complications will improve 08/30/2019 1108 by Orvan Falconer, RN Outcome: Progressing 08/30/2019 1107 by Orvan Falconer, RN Outcome: Progressing Goal: Cardiovascular complication will be avoided 08/30/2019 1108 by Orvan Falconer, RN Outcome: Progressing 08/30/2019 1107 by Orvan Falconer, RN Outcome: Progressing   Problem: Activity: Goal: Risk for activity intolerance will decrease 08/30/2019 1108 by Orvan Falconer, RN Outcome: Progressing 08/30/2019 1107 by Orvan Falconer, RN Outcome: Progressing   Problem: Nutrition: Goal: Adequate nutrition will be maintained 08/30/2019 1108 by Orvan Falconer, RN Outcome: Progressing 08/30/2019 1107 by Orvan Falconer, RN Outcome: Progressing   Problem: Coping: Goal: Level of anxiety will decrease 08/30/2019 1108 by Orvan Falconer, RN Outcome: Progressing 08/30/2019 1107 by Orvan Falconer, RN Outcome: Progressing   Problem: Elimination: Goal: Will not experience complications related to bowel motility 08/30/2019 1108 by Orvan Falconer, RN Outcome: Progressing 08/30/2019 1107 by Orvan Falconer, RN Outcome: Progressing Goal: Will not experience complications related to urinary retention 08/30/2019 1108 by Orvan Falconer, RN Outcome: Progressing 08/30/2019 1107 by Orvan Falconer, RN Outcome: Progressing   Problem: Pain Managment: Goal: General experience of comfort will improve 08/30/2019 1108 by Orvan Falconer, RN Outcome: Progressing 08/30/2019 1107 by Orvan Falconer, RN Outcome: Progressing   Problem: Safety: Goal: Ability to remain free from injury will improve 08/30/2019 1108 by Orvan Falconer, RN Outcome: Progressing 08/30/2019 1107 by Orvan Falconer, RN Outcome: Progressing   Problem: Skin  Integrity: Goal: Risk for impaired skin integrity will decrease 08/30/2019 1108 by Orvan Falconer, RN Outcome: Progressing 08/30/2019 1107 by Orvan Falconer, RN Outcome: Progressing

## 2019-08-30 NOTE — Progress Notes (Signed)
PROGRESS NOTE                                                                                                                                                                                                             Patient Demographics:    Sophia [name], is a 68 y.o. adult, DOB - 06-17-1951, ONG:[id number]  Outpatient Primary MD for the patient is Fayrene Helper, MD    LOS - 2  Admit date - 08/28/2019    Chief Complaint  Patient presents with   Generalized Body Aches       Brief Narrative - Sophia [name] is a 68 y.o. female with a history of diabetes type 2, hypertension, anxiety, depression, GERD.  Patient seen for generalized body aches, mild cough, elevated body temperature, diminished taste and smell that started 3 days prior to her hospital visit and admission.  In the ER she was diagnosed with COVID-19 pneumonitis and admitted.   Subjective:   Patient in bed, appears comfortable, denies any headache, no fever, no chest pain or pressure, no shortness of breath at rest , no abdominal pain. No focal weakness.    Assessment  & Plan :     1. Acute Hypoxic Resp. Failure due to Acute Covid 19 Viral Pneumonitis during the ongoing 2020 Covid 19 Pandemic - she is symptom-free at rest, inflammatory markers are pending but clinically looks stable, continue on steroids and Remdisvir combination.  Encouraged to sit up in chair in the daytime use flutter valve and I-S for pulmonary toiletry and prone in bed at night.  She will be monitored closely.  Actemra off label use - patient was told that if COVID-19 pneumonitis gets worse we might potentially use Actemra off label, she denies any known history of tuberculosis or hepatitis, understands the risks and benefits and wants to proceed with Actemra treatment if required.  She is also been consented for convalescent plasma if needed on 08/29/2019.   COVID-19 Labs  Recent  Labs    08/28/19 0747 08/28/19 1108 08/29/19 0840 08/30/19 0215  DDIMER 0.49  --  0.71* 0.65*  FERRITIN  --  555* 613* 683*  LDH 249*  --   --  278*  CRP  --  8.5* 17.9* 16.8*    Lab Results  Component Value Date   SARSCOV2NAA POSITIVE (A) 08/28/2019  Hepatic Function Latest Ref Rng & Units 08/30/2019 08/29/2019 08/28/2019  Total Protein 6.5 - 8.1 g/dL 7.4 7.4 8.2(H)  Albumin 3.5 - 5.0 g/dL 3.3(L) 3.7 3.9  AST 15 - 41 U/L 29 32 32  ALT 0 - 44 U/L 28 30 32  Alk Phosphatase 38 - 126 U/L 66 68 70  Total Bilirubin 0.3 - 1.2 mg/dL 0.4 0.7 0.4  Bilirubin, Direct 0.0 - 0.3 mg/dL - - -        Component Value Date/Time   BNP 40.5 08/30/2019 0215      2.  Essential hypertension.  Stable on Norvasc, ACE and HCTZ combination.  3.  GERD.  On PPI.  4.  Diabetic peripheral neuropathy.  Stable on Neurontin.  5. Pre DM2.  Follow with PCP.  On SSI here due to steroids.  CBG (last 3)  Recent Labs    08/29/19 2058 08/30/19 0619 08/30/19 0735  GLUCAP 132* 139* 134*       Condition - Fair  Family Communication  :  None  Code Status :  Full  Diet :   Diet Order            Diet Heart Room service appropriate? Yes; Fluid consistency: Thin  Diet effective now               Disposition Plan  :  Inpt  Consults  :  None  Procedures  :     CT -  1. Multiple areas of consolidation in the visualized bilateral lung bases suspicious for multifocal pneumonia. 2. No acute finding in the abdomen or pelvis. Aortic Atherosclerosis   PUD Prophylaxis : PPI  DVT Prophylaxis  :  Lovenox    Lab Results  Component Value Date   PLT 215 08/30/2019    Inpatient Medications  Scheduled Meds:  amLODipine  10 mg Oral Daily   aspirin EC  81 mg Oral Daily   benazepril  40 mg Oral Daily   And   hydrochlorothiazide  25 mg Oral Daily   busPIRone  5 mg Oral TID   enoxaparin (LOVENOX) injection  40 mg Subcutaneous Q24H   FLUoxetine  40 mg Oral Daily   gabapentin   100 mg Oral TID   insulin aspart  0-15 Units Subcutaneous Q4H   methylPREDNISolone (SOLU-MEDROL) injection  60 mg Intravenous Q24H   pantoprazole  40 mg Oral Daily   pravastatin  40 mg Oral QPM   vitamin C  500 mg Oral Daily   zinc sulfate  220 mg Oral Daily   Continuous Infusions:  sodium chloride Stopped (08/29/19 0003)   remdesivir 100 mg in NS 250 mL 100 mg (08/30/19 0925)   PRN Meds:.acetaminophen, ALPRAZolam, chlorpheniramine-HYDROcodone, guaiFENesin-dextromethorphan, ondansetron **OR** ondansetron (ZOFRAN) IV, phenol  Antibiotics  :    Anti-infectives (From admission, onward)   Start     Dose/Rate Route Frequency Ordered Stop   08/30/19 1000  remdesivir 100 mg in sodium chloride 0.9 % 250 mL IVPB     100 mg 500 mL/hr over 30 Minutes Intravenous Every 24 hours 08/29/19 0105 09/03/19 0959   08/29/19 0200  remdesivir 200 mg in sodium chloride 0.9 % 250 mL IVPB     200 mg 500 mL/hr over 30 Minutes Intravenous Once 08/29/19 0105 08/29/19 0233   08/28/19 1000  cefTRIAXone (ROCEPHIN) 1 g in sodium chloride 0.9 % 100 mL IVPB     1 g 200 mL/hr over 30 Minutes Intravenous  Once 08/28/19 0950 08/28/19 1118  08/28/19 1000  azithromycin (ZITHROMAX) 500 mg in sodium chloride 0.9 % 250 mL IVPB     500 mg 250 mL/hr over 60 Minutes Intravenous  Once 08/28/19 0950 08/28/19 1345       Time Spent in minutes  30   Lala Lund M.D on 08/30/2019 at 9:49 AM  To page go to [email or web address] - password [facility]  [facility] -  Office  [phone]     See all Orders from today for further details    Objective:   Vitals:   08/29/19 1630 08/29/19 2016 08/30/19 0623 08/30/19 0739  BP:  135/70 137/62 139/66  Pulse: 76 76 65 64  Resp: _0 Temp:  98.9 F (37.2 C) 98.4 F (36.9 C) 98.7 F (37.1 C)  TempSrc: Oral Oral Oral Oral  SpO2: 93% 91% 90%   Weight:      Height:        Wt Readings from Last 3 Encounters:  08/29/19 86.2 kg  07/14/19 96.2 kg  06/01/19  89.4 kg     Intake/Output Summary (Last 24 hours) at 08/30/2019 0949 Last data filed at 08/30/2019 0600 Gross per 24 hour  Intake 370 ml  Output --  Net 370 ml     Physical Exam  Awake Alert, Oriented X 3, No new F.N deficits, Normal affect [place].AT,PERRAL Supple Neck,No JVD, No cervical lymphadenopathy appriciated.  Symmetrical Chest wall movement, Good air movement bilaterally, CTAB RRR,No Gallops, Rubs or new Murmurs, No Parasternal Heave +ve B.Sounds, Abd Soft, No tenderness, No organomegaly appriciated, No rebound - guarding or rigidity. No Cyanosis, Clubbing or edema, No new Rash or bruise    Data Review:    CBC Recent Labs  Lab 08/28/19 0747 08/29/19 0840 08/30/19 0215  WBC 5.3 6.8 5.2  HGB 12.5 11.8* 11.9*  HCT 40.3 37.2 38.1  PLT 203 195 215  MCV 82.2 82.1 81.4  MCH 25.5* 26.0 25.4*  MCHC 31.0 31.7 31.2  RDW 14.7 14.7 14.7  LYMPHSABS 1.1 0.8 0.9  MONOABS 0.5 0.3 0.2  EOSABS 0.0 0.0 0.0  BASOSABS 0.0 0.0 0.0    Chemistries  Recent Labs  Lab 08/28/19 0747 08/29/19 0840 08/30/19 0215  NA 137 138 141  K 3.0* 3.0* 3.3*  CL 94* 97* 100  CO2 _1 GLUCOSE 122* 109* 133*  BUN _2 CREATININE 0.79 0.73 0.66  CALCIUM 9.0 8.5* 9.0  MG  --  1.8 2.0  AST 32 32 29  ALT 32 30 28  ALKPHOS 70 68 66  BILITOT 0.4 0.7 0.4   ------------------------------------------------------------------------------------------------------------------ Recent Labs    08/28/19 0843  TRIG 98    Lab Results  Component Value Date   HGBA1C 6.1 (H) 05/24/2019   ------------------------------------------------------------------------------------------------------------------ No results for input(s): TSH, T4TOTAL, T3FREE, THYROIDAB in the last 72 hours.  Invalid input(s): FREET3  Cardiac Enzymes No results for input(s): CKMB, TROPONINI, MYOGLOBIN in the last 168 hours.  Invalid input(s):  CK ------------------------------------------------------------------------------------------------------------------    Component Value Date/Time   BNP 40.5 08/30/2019 0215    Micro Results Recent Results (from the past 240 hour(s))  Culture, blood (routine x 2)     Status: None (Preliminary result)   Collection Time: 08/28/19  7:47 AM   Specimen: BLOOD RIGHT FOREARM  Result Value Ref Range Status   Specimen Description BLOOD RIGHT FOREARM  Final   Special Requests   Final    BOTTLES DRAWN AEROBIC AND ANAEROBIC Blood Culture adequate volume  Culture   Final    NO GROWTH 2 DAYS Performed at [facility], [address] [place], [place] [address]    Report Status PENDING  Incomplete  SARS Coronavirus 2 by RT PCR (hospital order, performed in [facility] hospital lab) Nasopharyngeal Nasopharyngeal Swab     Status: Abnormal   Collection Time: 08/28/19  7:47 AM   Specimen: Nasopharyngeal Swab  Result Value Ref Range Status   SARS Coronavirus 2 POSITIVE (A) NEGATIVE Final    Comment: RESULT CALLED TO, READ BACK BY AND VERIFIED WITH: HOLCOLM,R_0  BY MATTHEWS, B 10.10.2020 (NOTE) If result is NEGATIVE SARS-CoV-2 target nucleic acids are NOT DETECTED. The SARS-CoV-2 RNA is generally detectable in upper and lower  respiratory specimens during the acute phase of infection. The lowest  concentration of SARS-CoV-2 viral copies this assay can detect is 250  copies / mL. A negative result does not preclude SARS-CoV-2 infection  and should not be used as the sole basis for treatment or other  patient management decisions.  A negative result may occur with  improper specimen collection / handling, submission of specimen other  than nasopharyngeal swab, presence of viral mutation(s) within the  areas targeted by this assay, and inadequate number of viral copies  (<250 copies / mL). A negative result must be combined with clinical  observations, patient history, and epidemiological  information. If result is POSITIVE SARS-CoV-2 target nucleic acids are DETECTE D. The SARS-CoV-2 RNA is generally detectable in upper and lower  respiratory specimens during the acute phase of infection.  Positive  results are indicative of active infection with SARS-CoV-2.  Clinical  correlation with patient history and other diagnostic information is  necessary to determine patient infection status.  Positive results do  not rule out bacterial infection or co-infection with other viruses. If result is PRESUMPTIVE POSTIVE SARS-CoV-2 nucleic acids MAY BE PRESENT.   A presumptive positive result was obtained on the submitted specimen  and confirmed on repeat testing.  While 2019 novel coronavirus  (SARS-CoV-2) nucleic acids may be present in the submitted sample  additional confirmatory testing may be necessary for epidemiological  and / or clinical management purposes  to differentiate between  SARS-CoV-2 and other Sarbecovirus currently known to infect humans.  If clinically indicated additional testing with an alternate test  methodology (LAB745 3) is advised. The SARS-CoV-2 RNA is generally  detectable in upper and lower respiratory specimens during the acute  phase of infection. The expected result is Negative. Fact Sheet for Patients:  StrictlyIdeas.no Fact Sheet for Healthcare Providers: BankingDealers.co.za This test is not yet approved or cleared by the [place] FDA and has been authorized for detection and/or diagnosis of SARS-CoV-2 by FDA under an Emergency Use Authorization (EUA).  This EUA will remain in effect (meaning this test can be used) for the duration of the COVID-19 declaration under Section 564(b)(1) of the Act, 21 U.S.C. section 360bbb-3(b)(1), unless the authorization is terminated or revoked sooner. Performed at [facility], [address], [place], [place] [address]   Culture, blood (routine x 2)      Status: None (Preliminary result)   Collection Time: 08/28/19  8:02 AM   Specimen: Left Antecubital; Blood  Result Value Ref Range Status   Specimen Description LEFT ANTECUBITAL  Final   Special Requests   Final    BOTTLES DRAWN AEROBIC AND ANAEROBIC Blood Culture adequate volume   Culture   Final    NO GROWTH 2 DAYS Performed at [facility], [address] [place], [place] [address]    Report Status PENDING  Incomplete  Urine Culture     Status: Abnormal   Collection Time: 08/28/19  8:43 AM   Specimen: Urine, Clean Catch  Result Value Ref Range Status   Specimen Description   Final    URINE, CLEAN CATCH Performed at [facility], [address], [place], [place] [address]    Special Requests   Final    NONE Performed at [facility], [address], [place], [place] [address]    Culture (A)  Final    <10,000 COLONIES/mL INSIGNIFICANT GROWTH Performed at [place] [address] [place], [place] [address]    Report Status 08/30/2019 FINAL  Final    Radiology Reports Ct Abdomen Pelvis W Contrast  Result Date: 08/28/2019 CLINICAL DATA:  Per ED orders.Patient presents with low-grade fever, loss of taste and smell, vomiting and diarrhea for the past 3 days. Hx diabetes Diet controlled, htn/ bj COVID positive. EXAM: CT ABDOMEN AND PELVIS WITH CONTRAST TECHNIQUE: Multidetector CT imaging of the abdomen and pelvis was performed using the standard protocol following bolus administration of intravenous contrast. CONTRAST:  166m OMNIPAQUE IOHEXOL 300 MG/ML  SOLN COMPARISON:  CT abdomen pelvis 05/31/2019 FINDINGS: Lower chest: Multifocal consolidation in the visualized bilateral lung bases suspicious for infection. No pleural effusion. Small hiatal hernia. Hepatobiliary: No focal liver abnormality is seen. No gallstones or gallbladder wall thickening prominent common bile duct measuring 1.2 cm in diameter, chronic. No new intra or extrahepatic biliary duct dilation. Pancreas:  Unremarkable. No pancreatic ductal dilatation or surrounding inflammatory changes. Spleen: Normal in size without focal abnormality. Adrenals/Urinary Tract: Left greater than right thickening of the adrenal glands favored to represent hyperplasia. There are a few tiny hypodensities in the bilateral kidneys are too small fully characterize. No renal calculi or hydronephrosis. Bladder is unremarkable. Stomach/Bowel: Stomach is within normal limits. Appendix is not definitely visualized and may be surgically absent. No evidence of bowel wall thickening, distention, or inflammatory changes. Vascular/Lymphatic: Moderate aortoiliac atherosclerotic calcification without evidence of aneurysm. No enlarged abdominal or pelvic lymph nodes. Reproductive: Status post hysterectomy. Other: No abdominal wall hernia or abnormality. No abdominopelvic ascites. Musculoskeletal: No acute osseous abnormality. Degenerative changes at the pubic symphysis. IMPRESSION: 1. Multiple areas of consolidation in the visualized bilateral lung bases suspicious for multifocal pneumonia. 2. No acute finding in the abdomen or pelvis. Aortic Atherosclerosis (ICD10-I70.0). Electronically Signed   By: NAudie PintoM.D.   On: 08/28/2019 13:51   Dg Chest Port 1 View  Result Date: 08/28/2019 CLINICAL DATA:  Body aches EXAM: PORTABLE CHEST 1 VIEW COMPARISON:  06/22/2018 FINDINGS: Increased opacity at the right lung base. Cardiomediastinal contours are normal. No pneumothorax or sizable pleural effusion. IMPRESSION: Increased opacity at the right lung base may indicate developing consolidation. Electronically Signed   By: KUlyses JarredM.D.   On: 08/28/2019 07:00

## 2019-08-30 NOTE — Evaluation (Signed)
Physical Therapy Evaluation Patient Details Name: MAEGHEN GENT MRN: [id number] DOB: 12-09-1950 Today's Date: 08/30/2019   History of Present Illness  68 y.o. female with a history of diabetes type 2, hypertension, anxiety, depression, GERD.  Patient seen 08/28/19 for generalized body aches, mild cough, elevated body temperature, diminished taste and smell. COVID + with pna  Clinical Impression   Pt admitted with above diagnosis. Patient required 2L of O2 to maintain sats >88% while walking short distances (35 ft, 40 ft, 15 ft x 3) with standing rest breaks between with education in pursed lip breathing. Pt currently with functional limitations due to the deficits listed below (see PT Problem List). Pt will benefit from skilled PT to increase their independence and safety with mobility to allow discharge to the venue listed below.       Follow Up Recommendations No PT follow up    Equipment Recommendations  None recommended by PT    Recommendations for Other Services OT consult     Precautions / Restrictions Precautions Precautions: Other (comment) Precaution Comments: monitor sats      Mobility  Bed Mobility Overal bed mobility: Independent                Transfers Overall transfer level: Independent               General transfer comment: from bed, toilet, chair  Ambulation/Gait Ambulation/Gait assistance: Supervision Gait Distance (Feet): 35 Feet(standing rest, 40 rest, 15 x 3 with rests) Assistive device: None Gait Pattern/deviations: Step-through pattern;Decreased stride length     General Gait Details: slow to maintain sats and lower RR  Stairs            Wheelchair Mobility    Modified Rankin (Stroke Patients Only)       Balance Overall balance assessment: No apparent balance deficits (not formally assessed)                                           Pertinent Vitals/Pain Pain Assessment: 0-10 Pain Score: 2  Pain  Location: left upper chest with deep breath Pain Descriptors / Indicators: Discomfort Pain Intervention(s): Limited activity within patient's tolerance;Monitored during session    Home Living Family/patient expects to be discharged to:: Private residence Living Arrangements: Spouse/significant other;Other relatives(brother (with cancer s/p recent back surgery); father-in-law) Available Help at Discharge: Family;Available 24 hours/day Type of Home: House Home Access: Ramped entrance       Home Equipment: Grab bars - tub/shower(has equipment for father-in-law and brother)      Prior Function Level of Independence: Independent               Hand Dominance        Extremity/Trunk Assessment   Upper Extremity Assessment Upper Extremity Assessment: Overall WFL for tasks assessed    Lower Extremity Assessment Lower Extremity Assessment: Overall WFL for tasks assessed    Cervical / Trunk Assessment Cervical / Trunk Assessment: Normal  Communication   Communication: No difficulties  Cognition Arousal/Alertness: Awake/alert Behavior During Therapy: WFL for tasks assessed/performed Overall Cognitive Status: Within Functional Limits for tasks assessed                                        General Comments General comments (skin integrity, edema, etc.): on  2L throughout; with talking, walking dropped to 85%; cued for no talking and PLB and maintained >90%    Exercises     Assessment/Plan    PT Assessment Patient needs continued PT services  PT Problem List Decreased activity tolerance;Decreased mobility;Cardiopulmonary status limiting activity       PT Treatment Interventions Gait training;DME instruction;Functional mobility training;Therapeutic activities;Therapeutic exercise;Patient/family education    PT Goals (Current goals can be found in the Care Plan section)  Acute Rehab PT Goals Patient Stated Goal: get my lungs better PT Goal  Formulation: With patient Time For Goal Achievement: 09/13/19 Potential to Achieve Goals: Good    Frequency Min 3X/week   Barriers to discharge        Co-evaluation               AM-PAC PT "6 Clicks" Mobility  Outcome Measure Help needed turning from your back to your side while in a flat bed without using bedrails?: None Help needed moving from lying on your back to sitting on the side of a flat bed without using bedrails?: None Help needed moving to and from a bed to a chair (including a wheelchair)?: None Help needed standing up from a chair using your arms (e.g., wheelchair or bedside chair)?: None Help needed to walk in hospital room?: A Little Help needed climbing 3-5 steps with a railing? : A Little 6 Click Score: 22    End of Session Equipment Utilized During Treatment: Oxygen Activity Tolerance: Treatment limited secondary to medical complications (Comment) Patient left: in chair;with call bell/phone within reach Nurse Communication: Mobility status;Other (comment)(desaturating) PT Visit Diagnosis: Difficulty in walking, not elsewhere classified (R26.2)    Time: [id number] PT Time Calculation (min) (ACUTE ONLY): 40 min   Charges:   PT Evaluation $PT Eval Low Complexity: 1 Low PT Treatments $Gait Training: 8-22 mins $Self Care/Home Management: 8-22          Barry Brunner, PT      Ludell P Maryclare Nydam 08/30/2019, 11:12 AM

## 2019-08-31 LAB — COMPREHENSIVE METABOLIC PANEL
ALT: 27 U/L (ref 0–44)
AST: 26 U/L (ref 15–41)
Albumin: 3.1 g/dL — ABNORMAL LOW (ref 3.5–5.0)
Alkaline Phosphatase: 60 U/L (ref 38–126)
Anion gap: 11 (ref 5–15)
BUN: 24 mg/dL — ABNORMAL HIGH (ref 8–23)
CO2: 27 mmol/L (ref 22–32)
Calcium: 8.9 mg/dL (ref 8.9–10.3)
Chloride: 102 mmol/L (ref 98–111)
Creatinine, Ser: 0.69 mg/dL (ref 0.44–1.00)
GFR calc Af Amer: >60 mL/min (ref >60)
GFR calc non Af Amer: >60 mL/min (ref >60)
Glucose, Bld: 136 mg/dL — ABNORMAL HIGH (ref 70–99)
Potassium: 4 mmol/L (ref 3.5–5.1)
Sodium: 140 mmol/L (ref 135–145)
Total Bilirubin: 0.5 mg/dL (ref 0.3–1.2)
Total Protein: 7 g/dL (ref 6.5–8.1)

## 2019-08-31 LAB — CBC WITH DIFFERENTIAL/PLATELET
Abs Immature Granulocytes: 0.08 10*3/uL — ABNORMAL HIGH (ref 0.00–0.07)
Basophils Absolute: 0 10*3/uL (ref 0.0–0.1)
Basophils Relative: 0 %
Eosinophils Absolute: 0 10*3/uL (ref 0.0–0.5)
Eosinophils Relative: 0 %
HCT: 37.1 % (ref 36.0–46.0)
Hemoglobin: 11.7 g/dL — ABNORMAL LOW (ref 12.0–15.0)
Immature Granulocytes: 1 %
Lymphocytes Relative: 11 %
Lymphs Abs: 1.1 10*3/uL (ref 0.7–4.0)
MCH: 25.8 pg — ABNORMAL LOW (ref 26.0–34.0)
MCHC: 31.5 g/dL (ref 30.0–36.0)
MCV: 81.9 fL (ref 80.0–100.0)
Monocytes Absolute: 0.6 10*3/uL (ref 0.1–1.0)
Monocytes Relative: 6 %
Neutro Abs: 8.3 10*3/uL — ABNORMAL HIGH (ref 1.7–7.7)
Neutrophils Relative %: 82 %
Platelets: 252 10*3/uL (ref 150–400)
RBC: 4.53 MIL/uL (ref 3.87–5.11)
RDW: 14.8 % (ref 11.5–15.5)
WBC: 10 10*3/uL (ref 4.0–10.5)
nRBC: 0 % (ref 0.0–0.2)

## 2019-08-31 LAB — LACTATE DEHYDROGENASE: LDH: 257 U/L — ABNORMAL HIGH (ref 98–192)

## 2019-08-31 LAB — FERRITIN: Ferritin: 780 ng/mL — ABNORMAL HIGH (ref 11–307)

## 2019-08-31 LAB — GLUCOSE, CAPILLARY
Glucose-Capillary: 104 mg/dL — ABNORMAL HIGH (ref 70–99)
Glucose-Capillary: 122 mg/dL — ABNORMAL HIGH (ref 70–99)
Glucose-Capillary: 131 mg/dL — ABNORMAL HIGH (ref 70–99)
Glucose-Capillary: 144 mg/dL — ABNORMAL HIGH (ref 70–99)
Glucose-Capillary: 148 mg/dL — ABNORMAL HIGH (ref 70–99)
Glucose-Capillary: 153 mg/dL — ABNORMAL HIGH (ref 70–99)

## 2019-08-31 LAB — BRAIN NATRIURETIC PEPTIDE: B Natriuretic Peptide: 44.5 pg/mL (ref 0.0–100.0)

## 2019-08-31 LAB — MAGNESIUM: Magnesium: 2 mg/dL (ref 1.7–2.4)

## 2019-08-31 LAB — D-DIMER, QUANTITATIVE: D-Dimer, Quant: 0.56 ug/mL-FEU — ABNORMAL HIGH (ref 0.00–0.50)

## 2019-08-31 LAB — C-REACTIVE PROTEIN: CRP: 6 mg/dL — ABNORMAL HIGH (ref <1.0)

## 2019-08-31 NOTE — Progress Notes (Signed)
Called and spoke with pt's husband to update him on the pt's morning. His only question what when she would be home. I told him with cautious optimism that her fifth dose of remdesivir would be completed on Friday.

## 2019-08-31 NOTE — Evaluation (Signed)
Occupational Therapy Evaluation Patient Details Name: Sophia [name] MRN: [id number] DOB: October 10, 1951 Today's Date: 08/31/2019    History of Present Illness 68 y.o. female with a history of diabetes type 2, hypertension, anxiety, depression, GERD.  Patient seen 08/28/19 for generalized body aches, mild cough, elevated body temperature, diminished taste and smell. COVID + with pna   Clinical Impression   This 68 y/o female presents with the above. Pt very pleasant and eager to participate in therapy today. PTA she reports being independent with ADL and functional mobility. Pt completing room level functional mobility without AD at minguard assist level today; completing UB and LB ADL with supervision - mod independence. Pt does require min cues/encouragement to take rest breaks PRN with activity with good carryover noted. Pt on RA-2L O2 with activity with SpO2 >/= 88% throughout. She will benefit from continued acute OT services to further progress her activity tolerance, safety and independence with ADL and mobility. Will follow.     Follow Up Recommendations  No OT follow up;Supervision/Assistance - 24 hour    Equipment Recommendations  None recommended by OT(pt's DME needs are met)           Precautions / Restrictions Precautions Precautions: Other (comment) Precaution Comments: monitor sats Restrictions Weight Bearing Restrictions: No      Mobility Bed Mobility               General bed mobility comments: OOB in recliner  Transfers Overall transfer level: Independent                    Balance Overall balance assessment: No apparent balance deficits (not formally assessed)                                         ADL either performed or assessed with clinical judgement   ADL Overall ADL's : Needs assistance/impaired Eating/Feeding: Independent;Sitting   Grooming: Supervision/safety;Standing;Wash/dry face;Wash/dry hands   Upper Body  Bathing: Supervision/ safety;Standing   Lower Body Bathing: Supervison/ safety;Min guard;Sit to/from stand Lower Body Bathing Details (indicate cue type and reason): standing at sink in bathroom; BSC present for pt to take rest breaks PRN but with preference to attempt performing in standing as much as possible  Upper Body Dressing : Set up;Sitting Upper Body Dressing Details (indicate cue type and reason): donning new gown Lower Body Dressing: Sit to/from stand;Supervision/safety;Min guard Lower Body Dressing Details (indicate cue type and reason): pt donning R sock without difficulty end of session Toilet Transfer: Min guard;Ambulation Toilet Transfer Details (indicate cue type and reason): simulated via transfer to/from recliner, room level mobility to/from bathroom without AD Toileting- Clothing Manipulation and Hygiene: Sit to/from stand;Supervision/safety Toileting - Clothing Manipulation Details (indicate cue type and reason): reports has been up to BR ad lib   Tub/Shower Transfer Details (indicate cue type and reason): discussed use of shower seat initially as means to conserve energy after return home Functional mobility during ADLs: Min guard General ADL Comments: pt ambulating to BR and performing bathing ADL, overall tolerating majority of ADL task in standing, initiated seated rest break end of ADL prior to transfer back to recliner; issued EC handout and reviewed with pt as precursor to return home; discussed activity pacing/progression of returning to daily activities to prevent "overdoing it" as pt very motivated and eager to return to daily tasks  Pertinent Vitals/Pain Pain Assessment: No/denies pain     Hand Dominance     Extremity/Trunk Assessment Upper Extremity Assessment Upper Extremity Assessment: Overall WFL for tasks assessed   Lower Extremity Assessment Lower Extremity Assessment: Defer to PT evaluation;Overall [facility] for tasks  assessed   Cervical / Trunk Assessment Cervical / Trunk Assessment: Normal   Communication Communication Communication: No difficulties   Cognition Arousal/Alertness: Awake/alert Behavior During Therapy: WFL for tasks assessed/performed Overall Cognitive Status: Within Functional Limits for tasks assessed                                     General Comments  pt on RA-2L O2 throughout with SpO2 88%-90s throughout    Exercises     Shoulder Instructions      Home Living Family/patient expects to be discharged to:: Private residence Living Arrangements: Spouse/significant other;Other relatives(brother (with cancer s/p recent back surgery); father-in-law) Available Help at Discharge: Family;Available 24 hours/day Type of Home: House Home Access: Ramped entrance           Bathroom Shower/Tub: Hospital doctor Toilet: Standard     Home Equipment: Grab bars - tub/shower;Grab bars - toilet;Bedside commode;Shower seat(has equipment for father and brother)          Prior Functioning/Environment Level of Independence: Independent        Comments: 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, driving, 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[employer])         OT Problem List: Decreased activity tolerance;Cardiopulmonary status limiting activity      OT Treatment/Interventions: Self-care/ADL training;Neuromuscular education;Energy conservation;DME and/or AE instruction;Therapeutic activities;Patient/family education;Balance training;Therapeutic exercise    OT Goals(Current goals can be found in the care plan section) Acute Rehab OT Goals Patient Stated Goal: get my lungs better OT Goal Formulation: With patient Time For Goal Achievement: 09/14/19 Potential to Achieve Goals: Good  OT Frequency: Min 2X/week   Barriers to D/C:            Co-evaluation              AM-PAC OT "6 Clicks" Daily Activity     Outcome Measure Help from another person  eating meals?: None Help from another person taking care of personal grooming?: None Help from another person toileting, which includes using toliet, bedpan, or urinal?: A Little Help from another person bathing (including washing, rinsing, drying)?: A Little Help from another person to put on and taking off regular upper body clothing?: None Help from another person to put on and taking off regular lower body clothing?: A Little 6 Click Score: 21   End of Session Equipment Utilized During Treatment: Oxygen Nurse Communication: Mobility status  Activity Tolerance: Patient tolerated treatment well Patient left: in chair;with call bell/phone within reach  OT Visit Diagnosis: Other (comment)(decreased activity tolerance)                Time: 1001-1037 OT Time Calculation (min): 36 min Charges:  OT General Charges $OT Visit: 1 Visit OT Evaluation $OT Eval Moderate Complexity: 1 Mod OT Treatments $Self Care/Home Management : 8-22 mins  Lou Cal, OT [facility] Services Pager [phone] Office [phone]   Raymondo Band 08/31/2019, 11:42 AM

## 2019-08-31 NOTE — Progress Notes (Signed)
PROGRESS NOTE                                                                                                                                                                                                             Patient Demographics:    Sophia [name], is a 68 y.o. adult, DOB - 1950-12-01, GYI:[id number]  Outpatient Primary MD for the patient is Fayrene Helper, MD    LOS - 3  Admit date - 08/28/2019    Chief Complaint  Patient presents with   Generalized Body Aches       Brief Narrative - Sophia [name] is a 68 y.o. female with a history of diabetes type 2, hypertension, anxiety, depression, GERD.  Patient seen for generalized body aches, mild cough, elevated body temperature, diminished taste and smell that started 3 days prior to her hospital visit and admission.  In the ER she was diagnosed with COVID-19 pneumonitis and admitted.   Subjective:   Patient in bed, appears comfortable, denies any headache, no fever, no chest pain or pressure, no shortness of breath , no abdominal pain. No focal weakness.   Assessment  & Plan :     1. Acute Hypoxic Resp. Failure due to Acute Covid 19 Viral Pneumonitis during the ongoing 2020 Covid 19 Pandemic - she is symptom-free at rest, inflammatory markers are pending but clinically looks stable, continue on steroids and Remdisvir combination.  Clinically has stabilized continue present light of care and finish IV Remdisvir course.  Encouraged to sit up in chair in the daytime use flutter valve and I-S for pulmonary toiletry and prone in bed at night.  She will be monitored closely.  Actemra off label use - patient was told that if COVID-19 pneumonitis gets worse we might potentially use Actemra off label, she denies any known history of tuberculosis or hepatitis, understands the risks and benefits and wants to proceed with Actemra treatment if required.  She is also been consented  for convalescent plasma if needed on 08/29/2019.   COVID-19 Labs  Recent Labs    08/29/19 0840 08/30/19 0215 08/31/19 0555  DDIMER 0.71* 0.65* 0.56*  FERRITIN 613* 683* 780*  LDH  --  278* 257*  CRP 17.9* 16.8* 6.0*    Lab Results  Component Value Date   SARSCOV2NAA POSITIVE (A) 08/28/2019  Hepatic Function Latest Ref Rng & Units 08/31/2019 08/30/2019 08/29/2019  Total Protein 6.5 - 8.1 g/dL 7.0 7.4 7.4  Albumin 3.5 - 5.0 g/dL 3.1(L) 3.3(L) 3.7  AST 15 - 41 U/L 26 29 32  ALT 0 - 44 U/L '27 28 30  ' Alk Phosphatase 38 - 126 U/L 60 66 68  Total Bilirubin 0.3 - 1.2 mg/dL 0.5 0.4 0.7  Bilirubin, Direct 0.0 - 0.3 mg/dL - - -        Component Value Date/Time   BNP 44.5 08/31/2019 0555      2.  Essential hypertension.  Stable on Norvasc, ACE and HCTZ combination.  3.  GERD.  On PPI.  4.  Diabetic peripheral neuropathy.  Stable on Neurontin.  5. Pre DM2.  Follow with PCP.  On SSI here due to steroids.  CBG (last 3)  Recent Labs    08/31/19 0010 08/31/19 0420 08/31/19 0734  GLUCAP 104* 144* 122*       Condition - Fair  Family Communication  :  None  Code Status :  Full  Diet :   Diet Order            Diet Heart Room service appropriate? Yes; Fluid consistency: Thin  Diet effective now               Disposition Plan  :  Inpt  Consults  :  None  Procedures  :     CT -  1. Multiple areas of consolidation in the visualized bilateral lung bases suspicious for multifocal pneumonia. 2. No acute finding in the abdomen or pelvis. Aortic Atherosclerosis   PUD Prophylaxis : PPI  DVT Prophylaxis  :  Lovenox    Lab Results  Component Value Date   PLT 252 08/31/2019    Inpatient Medications  Scheduled Meds:  amLODipine  10 mg Oral Daily   aspirin EC  81 mg Oral Daily   benazepril  40 mg Oral Daily   And   hydrochlorothiazide  25 mg Oral Daily   busPIRone  5 mg Oral TID   enoxaparin (LOVENOX) injection  40 mg Subcutaneous Q24H    FLUoxetine  40 mg Oral Daily   gabapentin  100 mg Oral TID   insulin aspart  0-15 Units Subcutaneous Q4H   methylPREDNISolone (SOLU-MEDROL) injection  40 mg Intravenous Q12H   pantoprazole  40 mg Oral Daily   pravastatin  40 mg Oral QPM   vitamin C  500 mg Oral Daily   zinc sulfate  220 mg Oral Daily   Continuous Infusions:  sodium chloride Stopped (08/29/19 0003)   remdesivir 100 mg in NS 250 mL 100 mg (08/31/19 0918)   PRN Meds:.acetaminophen, ALPRAZolam, chlorpheniramine-HYDROcodone, guaiFENesin-dextromethorphan, [DISCONTINUED] ondansetron **OR** ondansetron (ZOFRAN) IV, phenol  Antibiotics  :    Anti-infectives (From admission, onward)   Start     Dose/Rate Route Frequency Ordered Stop   08/30/19 1000  remdesivir 100 mg in sodium chloride 0.9 % 250 mL IVPB     100 mg 500 mL/hr over 30 Minutes Intravenous Every 24 hours 08/29/19 0105 09/03/19 0959   08/29/19 0200  remdesivir 200 mg in sodium chloride 0.9 % 250 mL IVPB     200 mg 500 mL/hr over 30 Minutes Intravenous Once 08/29/19 0105 08/29/19 0233   08/28/19 1000  cefTRIAXone (ROCEPHIN) 1 g in sodium chloride 0.9 % 100 mL IVPB     1 g 200 mL/hr over 30 Minutes Intravenous  Once 08/28/19 0950 08/28/19 1118  08/28/19 1000  azithromycin (ZITHROMAX) 500 mg in sodium chloride 0.9 % 250 mL IVPB     500 mg 250 mL/hr over 60 Minutes Intravenous  Once 08/28/19 0950 08/28/19 1345       Time Spent in minutes  30   Lala Lund M.D on 08/31/2019 at 9:51 AM  To page go to [email or web address] - password [facility]  [facility] -  Office  [phone]     See all Orders from today for further details    Objective:   Vitals:   08/31/19 0409 08/31/19 0700 08/31/19 0737 08/31/19 0800  BP: 105/85 126/67 136/67   Pulse: 71 (!) 53 (!) 57 (!) 59  Resp: 15 (!) 22 19 (!) 32  Temp: 97.6 F (36.4 C)  98.6 F (37 C)   TempSrc: Oral  Oral   SpO2: (!) 89% 94% 90% 90%  Weight:      Height:        Wt Readings from Last 3  Encounters:  08/29/19 86.2 kg  07/14/19 96.2 kg  06/01/19 89.4 kg     Intake/Output Summary (Last 24 hours) at 08/31/2019 0951 Last data filed at 08/31/2019 0800 Gross per 24 hour  Intake 730 ml  Output 2 ml  Net 728 ml     Physical Exam  Awake Alert, Oriented X 3, No new F.N deficits, Normal affect [place].AT,PERRAL Supple Neck,No JVD, No cervical lymphadenopathy appriciated.  Symmetrical Chest wall movement, Good air movement bilaterally, CTAB RRR,No Gallops, Rubs or new Murmurs, No Parasternal Heave +ve B.Sounds, Abd Soft, No tenderness, No organomegaly appriciated, No rebound - guarding or rigidity. No Cyanosis, Clubbing or edema, No new Rash or bruise    Data Review:    CBC Recent Labs  Lab 08/28/19 0747 08/29/19 0840 08/30/19 0215 08/31/19 0555  WBC 5.3 6.8 5.2 10.0  HGB 12.5 11.8* 11.9* 11.7*  HCT 40.3 37.2 38.1 37.1  PLT 203 195 215 252  MCV 82.2 82.1 81.4 81.9  MCH 25.5* 26.0 25.4* 25.8*  MCHC 31.0 31.7 31.2 31.5  RDW 14.7 14.7 14.7 14.8  LYMPHSABS 1.1 0.8 0.9 1.1  MONOABS 0.5 0.3 0.2 0.6  EOSABS 0.0 0.0 0.0 0.0  BASOSABS 0.0 0.0 0.0 0.0    Chemistries  Recent Labs  Lab 08/28/19 0747 08/29/19 0840 08/30/19 0215 08/31/19 0555  NA 137 138 141 140  K 3.0* 3.0* 3.3* 4.0  CL 94* 97* 100 102  CO2 '30 27 28 27  ' GLUCOSE 122* 109* 133* 136*  BUN '16 10 17 ' 24*  CREATININE 0.79 0.73 0.66 0.69  CALCIUM 9.0 8.5* 9.0 8.9  MG  --  1.8 2.0 2.0  AST 32 32 29 26  ALT 32 '30 28 27  ' ALKPHOS 70 68 66 60  BILITOT 0.4 0.7 0.4 0.5   ------------------------------------------------------------------------------------------------------------------ No results for input(s): CHOL, HDL, LDLCALC, TRIG, CHOLHDL, LDLDIRECT in the last 72 hours.  Lab Results  Component Value Date   HGBA1C 6.1 (H) 05/24/2019   ------------------------------------------------------------------------------------------------------------------ No results for input(s): TSH, T4TOTAL, T3FREE,  THYROIDAB in the last 72 hours.  Invalid input(s): FREET3  Cardiac Enzymes No results for input(s): CKMB, TROPONINI, MYOGLOBIN in the last 168 hours.  Invalid input(s): CK ------------------------------------------------------------------------------------------------------------------    Component Value Date/Time   BNP 44.5 08/31/2019 0555    Micro Results Recent Results (from the past 240 hour(s))  Culture, blood (routine x 2)     Status: None (Preliminary result)   Collection Time: 08/28/19  7:47 AM   Specimen: BLOOD RIGHT  FOREARM  Result Value Ref Range Status   Specimen Description BLOOD RIGHT FOREARM  Final   Special Requests   Final    BOTTLES DRAWN AEROBIC AND ANAEROBIC Blood Culture adequate volume   Culture   Final    NO GROWTH 3 DAYS Performed at [facility], [address] [place], [place] [address]    Report Status PENDING  Incomplete  SARS Coronavirus 2 by RT PCR (hospital order, performed in [place] hospital lab) Nasopharyngeal Nasopharyngeal Swab     Status: Abnormal   Collection Time: 08/28/19  7:47 AM   Specimen: Nasopharyngeal Swab  Result Value Ref Range Status   SARS Coronavirus 2 POSITIVE (A) NEGATIVE Final    Comment: RESULT CALLED TO, READ BACK BY AND VERIFIED WITH: HOLCOLM,R'@1058'  BY MATTHEWS, B 10.10.2020 (NOTE) If result is NEGATIVE SARS-CoV-2 target nucleic acids are NOT DETECTED. The SARS-CoV-2 RNA is generally detectable in upper and lower  respiratory specimens during the acute phase of infection. The lowest  concentration of SARS-CoV-2 viral copies this assay can detect is 250  copies / mL. A negative result does not preclude SARS-CoV-2 infection  and should not be used as the sole basis for treatment or other  patient management decisions.  A negative result may occur with  improper specimen collection / handling, submission of specimen other  than nasopharyngeal swab, presence of viral mutation(s) within the  areas targeted by this  assay, and inadequate number of viral copies  (<250 copies / mL). A negative result must be combined with clinical  observations, patient history, and epidemiological information. If result is POSITIVE SARS-CoV-2 target nucleic acids are DETECTE D. The SARS-CoV-2 RNA is generally detectable in upper and lower  respiratory specimens during the acute phase of infection.  Positive  results are indicative of active infection with SARS-CoV-2.  Clinical  correlation with patient history and other diagnostic information is  necessary to determine patient infection status.  Positive results do  not rule out bacterial infection or co-infection with other viruses. If result is PRESUMPTIVE POSTIVE SARS-CoV-2 nucleic acids MAY BE PRESENT.   A presumptive positive result was obtained on the submitted specimen  and confirmed on repeat testing.  While 2019 novel coronavirus  (SARS-CoV-2) nucleic acids may be present in the submitted sample  additional confirmatory testing may be necessary for epidemiological  and / or clinical management purposes  to differentiate between  SARS-CoV-2 and other Sarbecovirus currently known to infect humans.  If clinically indicated additional testing with an alternate test  methodology (LAB745 3) is advised. The SARS-CoV-2 RNA is generally  detectable in upper and lower respiratory specimens during the acute  phase of infection. The expected result is Negative. Fact Sheet for Patients:  StrictlyIdeas.no Fact Sheet for Healthcare Providers: BankingDealers.co.za This test is not yet approved or cleared by the [place] FDA and has been authorized for detection and/or diagnosis of SARS-CoV-2 by FDA under an Emergency Use Authorization (EUA).  This EUA will remain in effect (meaning this test can be used) for the duration of the COVID-19 declaration under Section 564(b)(1) of the Act, 21 U.S.C. section 360bbb-3(b)(1),  unless the authorization is terminated or revoked sooner. Performed at [facility], [address] [place], [place] [address]   Culture, blood (routine x 2)     Status: None (Preliminary result)   Collection Time: 08/28/19  8:02 AM   Specimen: Left Antecubital; Blood  Result Value Ref Range Status   Specimen Description LEFT ANTECUBITAL  Final  Special Requests   Final    BOTTLES DRAWN AEROBIC AND ANAEROBIC Blood Culture adequate volume   Culture   Final    NO GROWTH 3 DAYS Performed at [facility], [address] [place], [place] [address]    Report Status PENDING  Incomplete  Urine Culture     Status: Abnormal   Collection Time: 08/28/19  8:43 AM   Specimen: Urine, Clean Catch  Result Value Ref Range Status   Specimen Description   Final    URINE, CLEAN CATCH Performed at [facility], [address], [place], [place] [address]    Special Requests   Final    NONE Performed at [facility], [address] [place], [place] [address]    Culture (A)  Final    <10,000 COLONIES/mL INSIGNIFICANT GROWTH Performed at [facility] [address], [place], [place] [address]    Report Status 08/30/2019 FINAL  Final    Radiology Reports Ct Abdomen Pelvis W Contrast  Result Date: 08/28/2019 CLINICAL DATA:  Per ED orders.Patient presents with low-grade fever, loss of taste and smell, vomiting and diarrhea for the past 3 days. Hx diabetes Diet controlled, htn/ bj COVID positive. EXAM: CT ABDOMEN AND PELVIS WITH CONTRAST TECHNIQUE: Multidetector CT imaging of the abdomen and pelvis was performed using the standard protocol following bolus administration of intravenous contrast. CONTRAST:  179m OMNIPAQUE IOHEXOL 300 MG/ML  SOLN COMPARISON:  CT abdomen pelvis 05/31/2019 FINDINGS: Lower chest: Multifocal consolidation in the visualized bilateral lung bases suspicious for infection. No pleural effusion. Small hiatal hernia. Hepatobiliary: No focal liver abnormality is seen. No  gallstones or gallbladder wall thickening prominent common bile duct measuring 1.2 cm in diameter, chronic. No new intra or extrahepatic biliary duct dilation. Pancreas: Unremarkable. No pancreatic ductal dilatation or surrounding inflammatory changes. Spleen: Normal in size without focal abnormality. Adrenals/Urinary Tract: Left greater than right thickening of the adrenal glands favored to represent hyperplasia. There are a few tiny hypodensities in the bilateral kidneys are too small fully characterize. No renal calculi or hydronephrosis. Bladder is unremarkable. Stomach/Bowel: Stomach is within normal limits. Appendix is not definitely visualized and may be surgically absent. No evidence of bowel wall thickening, distention, or inflammatory changes. Vascular/Lymphatic: Moderate aortoiliac atherosclerotic calcification without evidence of aneurysm. No enlarged abdominal or pelvic lymph nodes. Reproductive: Status post hysterectomy. Other: No abdominal wall hernia or abnormality. No abdominopelvic ascites. Musculoskeletal: No acute osseous abnormality. Degenerative changes at the pubic symphysis. IMPRESSION: 1. Multiple areas of consolidation in the visualized bilateral lung bases suspicious for multifocal pneumonia. 2. No acute finding in the abdomen or pelvis. Aortic Atherosclerosis (ICD10-I70.0). Electronically Signed   By: NAudie PintoM.D.   On: 08/28/2019 13:51   Dg Chest Port 1 View  Result Date: 08/28/2019 CLINICAL DATA:  Body aches EXAM: PORTABLE CHEST 1 VIEW COMPARISON:  06/22/2018 FINDINGS: Increased opacity at the right lung base. Cardiomediastinal contours are normal. No pneumothorax or sizable pleural effusion. IMPRESSION: Increased opacity at the right lung base may indicate developing consolidation. Electronically Signed   By: KUlyses JarredM.D.   On: 08/28/2019 07:00

## 2019-09-01 LAB — COMPREHENSIVE METABOLIC PANEL
ALT: 46 U/L — ABNORMAL HIGH (ref 0–44)
AST: 32 U/L (ref 15–41)
Albumin: 3.2 g/dL — ABNORMAL LOW (ref 3.5–5.0)
Alkaline Phosphatase: 62 U/L (ref 38–126)
Anion gap: 11 (ref 5–15)
BUN: 24 mg/dL — ABNORMAL HIGH (ref 8–23)
CO2: 29 mmol/L (ref 22–32)
Calcium: 8.8 mg/dL — ABNORMAL LOW (ref 8.9–10.3)
Chloride: 101 mmol/L (ref 98–111)
Creatinine, Ser: 0.73 mg/dL (ref 0.44–1.00)
GFR calc Af Amer: >60 mL/min (ref >60)
GFR calc non Af Amer: >60 mL/min (ref >60)
Glucose, Bld: 134 mg/dL — ABNORMAL HIGH (ref 70–99)
Potassium: 3.9 mmol/L (ref 3.5–5.1)
Sodium: 141 mmol/L (ref 135–145)
Total Bilirubin: 0.9 mg/dL (ref 0.3–1.2)
Total Protein: 6.9 g/dL (ref 6.5–8.1)

## 2019-09-01 LAB — CBC WITH DIFFERENTIAL/PLATELET
Abs Immature Granulocytes: 0.15 10*3/uL — ABNORMAL HIGH (ref 0.00–0.07)
Basophils Absolute: 0 10*3/uL (ref 0.0–0.1)
Basophils Relative: 0 %
Eosinophils Absolute: 0 10*3/uL (ref 0.0–0.5)
Eosinophils Relative: 0 %
HCT: 37.7 % (ref 36.0–46.0)
Hemoglobin: 11.9 g/dL — ABNORMAL LOW (ref 12.0–15.0)
Immature Granulocytes: 2 %
Lymphocytes Relative: 13 %
Lymphs Abs: 1.3 10*3/uL (ref 0.7–4.0)
MCH: 25.8 pg — ABNORMAL LOW (ref 26.0–34.0)
MCHC: 31.6 g/dL (ref 30.0–36.0)
MCV: 81.6 fL (ref 80.0–100.0)
Monocytes Absolute: 0.5 10*3/uL (ref 0.1–1.0)
Monocytes Relative: 5 %
Neutro Abs: 8.3 10*3/uL — ABNORMAL HIGH (ref 1.7–7.7)
Neutrophils Relative %: 80 %
Platelets: 272 10*3/uL (ref 150–400)
RBC: 4.62 MIL/uL (ref 3.87–5.11)
RDW: 14.7 % (ref 11.5–15.5)
WBC: 10.2 10*3/uL (ref 4.0–10.5)
nRBC: 0 % (ref 0.0–0.2)

## 2019-09-01 LAB — LACTATE DEHYDROGENASE: LDH: 259 U/L — ABNORMAL HIGH (ref 98–192)

## 2019-09-01 LAB — BRAIN NATRIURETIC PEPTIDE: B Natriuretic Peptide: 30.7 pg/mL (ref 0.0–100.0)

## 2019-09-01 LAB — GLUCOSE, CAPILLARY
Glucose-Capillary: 149 mg/dL — ABNORMAL HIGH (ref 70–99)
Glucose-Capillary: 133 mg/dL — ABNORMAL HIGH (ref 70–99)
Glucose-Capillary: 166 mg/dL — ABNORMAL HIGH (ref 70–99)
Glucose-Capillary: 155 mg/dL — ABNORMAL HIGH (ref 70–99)
Glucose-Capillary: 175 mg/dL — ABNORMAL HIGH (ref 70–99)
Glucose-Capillary: 145 mg/dL — ABNORMAL HIGH (ref 70–99)

## 2019-09-01 LAB — C-REACTIVE PROTEIN: CRP: 3.3 mg/dL — ABNORMAL HIGH (ref <1.0)

## 2019-09-01 LAB — D-DIMER, QUANTITATIVE: D-Dimer, Quant: 0.56 ug/mL-FEU — ABNORMAL HIGH (ref 0.00–0.50)

## 2019-09-01 LAB — FERRITIN: Ferritin: 753 ng/mL — ABNORMAL HIGH (ref 11–307)

## 2019-09-01 LAB — MAGNESIUM: Magnesium: 2 mg/dL (ref 1.7–2.4)

## 2019-09-01 NOTE — Plan of Care (Signed)
  Problem: Respiratory: Goal: Complications related to the disease process, condition or treatment will be avoided or minimized Outcome: Progressing   

## 2019-09-01 NOTE — Progress Notes (Signed)
Spoke with patient's husband.  Answered all questions and addressed all concerns.

## 2019-09-01 NOTE — Progress Notes (Signed)
Physical Therapy Treatment Patient Details Name: Sophia [name] MRN: [id number] DOB: 04-26-1951 Today's Date: 09/01/2019    History of Present Illness 69 y.o. female with a history of diabetes type 2, hypertension, anxiety, depression, GERD.  Patient seen 08/28/19 for generalized body aches, mild cough, elevated body temperature, diminished taste and smell. COVID + with pna    PT Comments    Pt does well with mobility functionally, was found in room standing at window with no lines or 02 in place, states she has been told to ambulate as much as able to in room. Pt does well but needs reinforcement on energy conservation and also monitoring signs of hypoxia. Pt ambulated approx 473ft with no AD and on room air, sats remained 85%-low 90s throughout, did notice signs of fatigue towards end of ambulation distance.     Follow Up Recommendations  No PT follow up     Equipment Recommendations  None recommended by PT    Recommendations for Other Services       Precautions / Restrictions Precautions Precautions: Other (comment) Precaution Comments: monitor sats Restrictions Weight Bearing Restrictions: No    Mobility  Bed Mobility Overal bed mobility: Independent             General bed mobility comments: found standing at window. no lines or 02 connected  Transfers Overall transfer level: Modified independent                  Ambulation/Gait Ambulation/Gait assistance: Supervision Gait Distance (Feet): 400 Feet Assistive device: None Gait Pattern/deviations: WFL(Within Functional Limits);Step-through pattern     General Gait Details: cues needed to keep breathing, initially was able to ambulate at normal pace and carry on conversation but became fatigued, even though she would not take short cut nor admit to fatigue. was able to complete distance on room air sats dropped to min 85% but was able to briefly stand and take deep breath to bring back to low 90s.  measured on finger probe, 02 tank brought along in case of need.   Stairs             Wheelchair Mobility    Modified Rankin (Stroke Patients Only)       Balance Overall balance assessment: No apparent balance deficits (not formally assessed)                                          Cognition Arousal/Alertness: Awake/alert Behavior During Therapy: WFL for tasks assessed/performed Overall Cognitive Status: Within Functional Limits for tasks assessed                                        Exercises      General Comments General comments (skin integrity, edema, etc.): on room air throughout and sat ranged from 85%- high 90s.       Pertinent Vitals/Pain Pain Assessment: No/denies pain    Home Living                      Prior Function            PT Goals (current goals can now be found in the care plan section) Acute Rehab PT Goals Patient Stated Goal: get my lungs better PT Goal Formulation: With patient Time For  Goal Achievement: 09/13/19 Potential to Achieve Goals: Good Progress towards PT goals: Progressing toward goals    Frequency    Min 3X/week      PT Plan      Co-evaluation              AM-PAC PT "6 Clicks" Mobility   Outcome Measure  Help needed turning from your back to your side while in a flat bed without using bedrails?: None Help needed moving from lying on your back to sitting on the side of a flat bed without using bedrails?: None Help needed moving to and from a bed to a chair (including a wheelchair)?: None Help needed standing up from a chair using your arms (e.g., wheelchair or bedside chair)?: None Help needed to walk in hospital room?: A Little Help needed climbing 3-5 steps with a railing? : A Little 6 Click Score: 22    End of Session   Activity Tolerance: Treatment limited secondary to medical complications (Comment) Patient left: in chair;with call bell/phone within  reach Nurse Communication: Mobility status;Other (comment) PT Visit Diagnosis: Difficulty in walking, not elsewhere classified (R26.2)     Time: 1013-1030 PT Time Calculation (min) (ACUTE ONLY): 17 min  Charges:  $Gait Training: 8-22 mins                     Horald Chestnut, PT    Delford Field 09/01/2019, 1:38 PM

## 2019-09-01 NOTE — Progress Notes (Signed)
PROGRESS NOTE                                                                                                                                                                                                             Patient Demographics:    Sophia [name], is a 68 y.o. adult, DOB - 08-25-1951, EXN:[id number]  Outpatient Primary MD for the patient is Fayrene Helper, MD    LOS - 4  Admit date - 08/28/2019    Chief Complaint  Patient presents with   Generalized Body Aches       Brief Narrative - Sophia [name] is a 68 y.o. female with a history of diabetes type 2, hypertension, anxiety, depression, GERD.  Patient seen for generalized body aches, mild cough, elevated body temperature, diminished taste and smell that started 3 days prior to her hospital visit and admission.  In the ER she was diagnosed with COVID-19 pneumonitis and admitted.   Subjective:   Patient in bed, appears comfortable, denies any headache, no fever, no chest pain or pressure, no shortness of breath , no abdominal pain. No focal weakness.   Assessment  & Plan :     1. Acute Hypoxic Resp. Failure due to Acute Covid 19 Viral Pneumonitis during the ongoing 2020 Covid 19 Pandemic - she is symptom-free at rest, inflammatory markers are pending but clinically looks stable, continue on steroids and Remdisvir combination.  Clinically has stabilized continue present light of care and finish IV Remdisvir course.  Encouraged to sit up in chair in the daytime use flutter valve and I-S for pulmonary toiletry and prone in bed at night.  She will be monitored closely.   COVID-19 Labs  Recent Labs    08/30/19 0215 08/31/19 0555 09/01/19 0410  DDIMER 0.65* 0.56* 0.56*  FERRITIN 683* 780* 753*  LDH 278* 257* 259*  CRP 16.8* 6.0* 3.3*    Lab Results  Component Value Date   SARSCOV2NAA POSITIVE (A) 08/28/2019     Hepatic Function Latest Ref Rng & Units  09/01/2019 08/31/2019 08/30/2019  Total Protein 6.5 - 8.1 g/dL 6.9 7.0 7.4  Albumin 3.5 - 5.0 g/dL 3.2(L) 3.1(L) 3.3(L)  AST 15 - 41 U/L 32 26 29  ALT 0 - 44 U/L 46(H) 27 28  Alk Phosphatase 38 - 126 U/L 62 60 66  Total  Bilirubin 0.3 - 1.2 mg/dL 0.9 0.5 0.4  Bilirubin, Direct 0.0 - 0.3 mg/dL - - -        Component Value Date/Time   BNP 30.7 09/01/2019 0410     SpO2: 92 % O2 Flow Rate (L/min): 1 L/min   2.  Essential hypertension.  Stable on Norvasc, ACE and HCTZ combination.  3.  GERD.  On PPI.  4.  Diabetic peripheral neuropathy.  Stable on Neurontin.  5. Pre DM2.  Follow with PCP.  On SSI here due to steroids.  CBG (last 3)  Recent Labs    08/31/19 1558 08/31/19 2108 09/01/19 0401  GLUCAP 153* 148* 149*       Condition - Fair  Family Communication  :  None  Code Status :  Full  Diet :   Diet Order            Diet Heart Room service appropriate? Yes; Fluid consistency: Thin  Diet effective now               Disposition Plan  :  Inpt  Consults  :  None  Procedures  :     CT -  1. Multiple areas of consolidation in the visualized bilateral lung bases suspicious for multifocal pneumonia. 2. No acute finding in the abdomen or pelvis. Aortic Atherosclerosis   PUD Prophylaxis : PPI  DVT Prophylaxis  :  Lovenox    Lab Results  Component Value Date   PLT 272 09/01/2019    Inpatient Medications  Scheduled Meds:  amLODipine  10 mg Oral Daily   aspirin EC  81 mg Oral Daily   benazepril  40 mg Oral Daily   And   hydrochlorothiazide  25 mg Oral Daily   busPIRone  5 mg Oral TID   enoxaparin (LOVENOX) injection  40 mg Subcutaneous Q24H   FLUoxetine  40 mg Oral Daily   gabapentin  100 mg Oral TID   insulin aspart  0-15 Units Subcutaneous Q4H   methylPREDNISolone (SOLU-MEDROL) injection  40 mg Intravenous Q12H   pantoprazole  40 mg Oral Daily   pravastatin  40 mg Oral QPM   vitamin C  500 mg Oral Daily   zinc sulfate  220 mg Oral  Daily   Continuous Infusions:  sodium chloride Stopped (08/29/19 0003)   remdesivir 100 mg in NS 250 mL 100 mg (08/31/19 0918)   PRN Meds:.acetaminophen, ALPRAZolam, chlorpheniramine-HYDROcodone, guaiFENesin-dextromethorphan, [DISCONTINUED] ondansetron **OR** ondansetron (ZOFRAN) IV, phenol  Antibiotics  :    Anti-infectives (From admission, onward)   Start     Dose/Rate Route Frequency Ordered Stop   08/30/19 1000  remdesivir 100 mg in sodium chloride 0.9 % 250 mL IVPB     100 mg 500 mL/hr over 30 Minutes Intravenous Every 24 hours 08/29/19 0105 09/03/19 0959   08/29/19 0200  remdesivir 200 mg in sodium chloride 0.9 % 250 mL IVPB     200 mg 500 mL/hr over 30 Minutes Intravenous Once 08/29/19 0105 08/29/19 0233   08/28/19 1000  cefTRIAXone (ROCEPHIN) 1 g in sodium chloride 0.9 % 100 mL IVPB     1 g 200 mL/hr over 30 Minutes Intravenous  Once 08/28/19 0950 08/28/19 1118   08/28/19 1000  azithromycin (ZITHROMAX) 500 mg in sodium chloride 0.9 % 250 mL IVPB     500 mg 250 mL/hr over 60 Minutes Intravenous  Once 08/28/19 0950 08/28/19 1345       Time Spent in minutes  30  Lala Lund M.D on 09/01/2019 at 9:40 AM  To page go to [email or web address] - password [facility]  [facility] -  Office  [phone]     See all Orders from today for further details    Objective:   Vitals:   08/31/19 1800 08/31/19 2109 09/01/19 0445 09/01/19 0748  BP:  136/69 126/73 131/73  Pulse: 73 (!) 59 66 (!) 55  Resp: (!) '21 20 18 20  ' Temp:  98 F (36.7 C) 98.3 F (36.8 C) 98.1 F (36.7 C)  TempSrc:  Oral Oral Oral  SpO2: 94% 95% 93% 92%  Weight:      Height:        Wt Readings from Last 3 Encounters:  08/29/19 86.2 kg  07/14/19 96.2 kg  06/01/19 89.4 kg     Intake/Output Summary (Last 24 hours) at 09/01/2019 0940 Last data filed at 09/01/2019 0926 Gross per 24 hour  Intake 1450 ml  Output --  Net 1450 ml     Physical Exam  Awake Alert, Oriented X 3, No new F.N  deficits, Normal affect Bent.AT,PERRAL Supple Neck,No JVD, No cervical lymphadenopathy appriciated.  Symmetrical Chest wall movement, Good air movement bilaterally, CTAB RRR,No Gallops, Rubs or new Murmurs, No Parasternal Heave +ve B.Sounds, Abd Soft, No tenderness, No organomegaly appriciated, No rebound - guarding or rigidity. No Cyanosis, Clubbing or edema, No new Rash or bruise    Data Review:    CBC Recent Labs  Lab 08/28/19 0747 08/29/19 0840 08/30/19 0215 08/31/19 0555 09/01/19 0410  WBC 5.3 6.8 5.2 10.0 10.2  HGB 12.5 11.8* 11.9* 11.7* 11.9*  HCT 40.3 37.2 38.1 37.1 37.7  PLT 203 195 215 252 272  MCV 82.2 82.1 81.4 81.9 81.6  MCH 25.5* 26.0 25.4* 25.8* 25.8*  MCHC 31.0 31.7 31.2 31.5 31.6  RDW 14.7 14.7 14.7 14.8 14.7  LYMPHSABS 1.1 0.8 0.9 1.1 1.3  MONOABS 0.5 0.3 0.2 0.6 0.5  EOSABS 0.0 0.0 0.0 0.0 0.0  BASOSABS 0.0 0.0 0.0 0.0 0.0    Chemistries  Recent Labs  Lab 08/28/19 0747 08/29/19 0840 08/30/19 0215 08/31/19 0555 09/01/19 0410  NA 137 138 141 140 141  K 3.0* 3.0* 3.3* 4.0 3.9  CL 94* 97* 100 102 101  CO2 '30 27 28 27 29  ' GLUCOSE 122* 109* 133* 136* 134*  BUN '16 10 17 ' 24* 24*  CREATININE 0.79 0.73 0.66 0.69 0.73  CALCIUM 9.0 8.5* 9.0 8.9 8.8*  MG  --  1.8 2.0 2.0 2.0  AST 32 32 29 26 32  ALT 32 '30 28 27 ' 46*  ALKPHOS 70 68 66 60 62  BILITOT 0.4 0.7 0.4 0.5 0.9   ------------------------------------------------------------------------------------------------------------------ No results for input(s): CHOL, HDL, LDLCALC, TRIG, CHOLHDL, LDLDIRECT in the last 72 hours.  Lab Results  Component Value Date   HGBA1C 6.1 (H) 05/24/2019   ------------------------------------------------------------------------------------------------------------------ No results for input(s): TSH, T4TOTAL, T3FREE, THYROIDAB in the last 72 hours.  Invalid input(s): FREET3  Cardiac Enzymes No results for input(s): CKMB, TROPONINI, MYOGLOBIN in the last 168  hours.  Invalid input(s): CK ------------------------------------------------------------------------------------------------------------------    Component Value Date/Time   BNP 30.7 09/01/2019 0410    Micro Results Recent Results (from the past 240 hour(s))  Culture, blood (routine x 2)     Status: None (Preliminary result)   Collection Time: 08/28/19  7:47 AM   Specimen: BLOOD RIGHT FOREARM  Result Value Ref Range Status   Specimen Description BLOOD RIGHT FOREARM  Final   Special  Requests   Final    BOTTLES DRAWN AEROBIC AND ANAEROBIC Blood Culture adequate volume   Culture   Final    NO GROWTH 4 DAYS Performed at [facility], [address], [place], [place] [address]    Report Status PENDING  Incomplete  SARS Coronavirus 2 by RT PCR (hospital order, performed in [place] hospital lab) Nasopharyngeal Nasopharyngeal Swab     Status: Abnormal   Collection Time: 08/28/19  7:47 AM   Specimen: Nasopharyngeal Swab  Result Value Ref Range Status   SARS Coronavirus 2 POSITIVE (A) NEGATIVE Final    Comment: RESULT CALLED TO, READ BACK BY AND VERIFIED WITH: HOLCOLM,R'@1058'  BY MATTHEWS, B 10.10.2020 (NOTE) If result is NEGATIVE SARS-CoV-2 target nucleic acids are NOT DETECTED. The SARS-CoV-2 RNA is generally detectable in upper and lower  respiratory specimens during the acute phase of infection. The lowest  concentration of SARS-CoV-2 viral copies this assay can detect is 250  copies / mL. A negative result does not preclude SARS-CoV-2 infection  and should not be used as the sole basis for treatment or other  patient management decisions.  A negative result may occur with  improper specimen collection / handling, submission of specimen other  than nasopharyngeal swab, presence of viral mutation(s) within the  areas targeted by this assay, and inadequate number of viral copies  (<250 copies / mL). A negative result must be combined with clinical  observations, patient history,  and epidemiological information. If result is POSITIVE SARS-CoV-2 target nucleic acids are DETECTE D. The SARS-CoV-2 RNA is generally detectable in upper and lower  respiratory specimens during the acute phase of infection.  Positive  results are indicative of active infection with SARS-CoV-2.  Clinical  correlation with patient history and other diagnostic information is  necessary to determine patient infection status.  Positive results do  not rule out bacterial infection or co-infection with other viruses. If result is PRESUMPTIVE POSTIVE SARS-CoV-2 nucleic acids MAY BE PRESENT.   A presumptive positive result was obtained on the submitted specimen  and confirmed on repeat testing.  While 2019 novel coronavirus  (SARS-CoV-2) nucleic acids may be present in the submitted sample  additional confirmatory testing may be necessary for epidemiological  and / or clinical management purposes  to differentiate between  SARS-CoV-2 and other Sarbecovirus currently known to infect humans.  If clinically indicated additional testing with an alternate test  methodology (LAB745 3) is advised. The SARS-CoV-2 RNA is generally  detectable in upper and lower respiratory specimens during the acute  phase of infection. The expected result is Negative. Fact Sheet for Patients:  StrictlyIdeas.no Fact Sheet for Healthcare Providers: BankingDealers.co.za This test is not yet approved or cleared by the [place] FDA and has been authorized for detection and/or diagnosis of SARS-CoV-2 by FDA under an Emergency Use Authorization (EUA).  This EUA will remain in effect (meaning this test can be used) for the duration of the COVID-19 declaration under Section 564(b)(1) of the Act, 21 U.S.C. section 360bbb-3(b)(1), unless the authorization is terminated or revoked sooner. Performed at [facility], [address], [place], [place] [address]   Culture, blood  (routine x 2)     Status: None (Preliminary result)   Collection Time: 08/28/19  8:02 AM   Specimen: Left Antecubital; Blood  Result Value Ref Range Status   Specimen Description LEFT ANTECUBITAL  Final   Special Requests   Final    BOTTLES DRAWN AEROBIC AND ANAEROBIC Blood Culture adequate volume  Culture   Final    NO GROWTH 4 DAYS Performed at [facility], [address] [place], [place] [address]    Report Status PENDING  Incomplete  Urine Culture     Status: Abnormal   Collection Time: 08/28/19  8:43 AM   Specimen: Urine, Clean Catch  Result Value Ref Range Status   Specimen Description   Final    URINE, CLEAN CATCH Performed at [facility], [address] [place], [place] [address]    Special Requests   Final    NONE Performed at [facility], [address], [place], [place] [address]    Culture (A)  Final    <10,000 COLONIES/mL INSIGNIFICANT GROWTH Performed at [facility] [address] [place], [place] [address]    Report Status 08/30/2019 FINAL  Final    Radiology Reports Ct Abdomen Pelvis W Contrast  Result Date: 08/28/2019 CLINICAL DATA:  Per ED orders.Patient presents with low-grade fever, loss of taste and smell, vomiting and diarrhea for the past 3 days. Hx diabetes Diet controlled, htn/ bj COVID positive. EXAM: CT ABDOMEN AND PELVIS WITH CONTRAST TECHNIQUE: Multidetector CT imaging of the abdomen and pelvis was performed using the standard protocol following bolus administration of intravenous contrast. CONTRAST:  160m OMNIPAQUE IOHEXOL 300 MG/ML  SOLN COMPARISON:  CT abdomen pelvis 05/31/2019 FINDINGS: Lower chest: Multifocal consolidation in the visualized bilateral lung bases suspicious for infection. No pleural effusion. Small hiatal hernia. Hepatobiliary: No focal liver abnormality is seen. No gallstones or gallbladder wall thickening prominent common bile duct measuring 1.2 cm in diameter, chronic. No new intra or extrahepatic biliary duct  dilation. Pancreas: Unremarkable. No pancreatic ductal dilatation or surrounding inflammatory changes. Spleen: Normal in size without focal abnormality. Adrenals/Urinary Tract: Left greater than right thickening of the adrenal glands favored to represent hyperplasia. There are a few tiny hypodensities in the bilateral kidneys are too small fully characterize. No renal calculi or hydronephrosis. Bladder is unremarkable. Stomach/Bowel: Stomach is within normal limits. Appendix is not definitely visualized and may be surgically absent. No evidence of bowel wall thickening, distention, or inflammatory changes. Vascular/Lymphatic: Moderate aortoiliac atherosclerotic calcification without evidence of aneurysm. No enlarged abdominal or pelvic lymph nodes. Reproductive: Status post hysterectomy. Other: No abdominal wall hernia or abnormality. No abdominopelvic ascites. Musculoskeletal: No acute osseous abnormality. Degenerative changes at the pubic symphysis. IMPRESSION: 1. Multiple areas of consolidation in the visualized bilateral lung bases suspicious for multifocal pneumonia. 2. No acute finding in the abdomen or pelvis. Aortic Atherosclerosis (ICD10-I70.0). Electronically Signed   By: NAudie PintoM.D.   On: 08/28/2019 13:51   Dg Chest Port 1 View  Result Date: 08/28/2019 CLINICAL DATA:  Body aches EXAM: PORTABLE CHEST 1 VIEW COMPARISON:  06/22/2018 FINDINGS: Increased opacity at the right lung base. Cardiomediastinal contours are normal. No pneumothorax or sizable pleural effusion. IMPRESSION: Increased opacity at the right lung base may indicate developing consolidation. Electronically Signed   By: KUlyses JarredM.D.   On: 08/28/2019 07:00

## 2019-09-01 NOTE — Consult Note (Signed)
   [facility] CM Inpatient Consult   09/01/2019  Sophia [name] 1950/11/24 [id number]    Patient screened for medium risk score for unplanned readmission score  and/or for hospitalizations to check if potential [facility] Management services are needed.  Review of patient's medical record reveals from MD notes and includes but not limited to the patient is:  Sophia [name] is a 68 y.o. female with a history of diabetes type 2, hypertension, anxiety, depression, GERD.  Patient seen for generalized body aches, mild cough, elevated body temperature, diminished taste and smell that started a 3 days ago.  Her symptoms are worsening.  No palliating or provoking factors.  She does have a possible COVID contact: Her neighbors mother-in-law came down from [place] in September for a wedding, and later tested COVID positive.  Primary Care Provider is Tula Nakayama, MD  Plan:  Follow for disposition and post hospital care management needs. Follow with inpatient [facility] team member.  Please place a [facility] Management consult as appropriate and for questions contact:   Natividad Brood, RN BSN [facility] Liaison  [phone] business mobile phone Toll free office [phone]  Fax number: [phone] [email or web address]

## 2019-09-02 LAB — COMPREHENSIVE METABOLIC PANEL
ALT: 61 U/L — ABNORMAL HIGH (ref 0–44)
AST: 33 U/L (ref 15–41)
Albumin: 3 g/dL — ABNORMAL LOW (ref 3.5–5.0)
Alkaline Phosphatase: 56 U/L (ref 38–126)
Anion gap: 12 (ref 5–15)
BUN: 24 mg/dL — ABNORMAL HIGH (ref 8–23)
CO2: 28 mmol/L (ref 22–32)
Calcium: 8.8 mg/dL — ABNORMAL LOW (ref 8.9–10.3)
Chloride: 99 mmol/L (ref 98–111)
Creatinine, Ser: 0.75 mg/dL (ref 0.44–1.00)
GFR calc Af Amer: >60 mL/min (ref >60)
GFR calc non Af Amer: >60 mL/min (ref >60)
Glucose, Bld: 75 mg/dL (ref 70–99)
Potassium: 3.5 mmol/L (ref 3.5–5.1)
Sodium: 139 mmol/L (ref 135–145)
Total Bilirubin: 0.4 mg/dL (ref 0.3–1.2)
Total Protein: 6.4 g/dL — ABNORMAL LOW (ref 6.5–8.1)

## 2019-09-02 LAB — CBC WITH DIFFERENTIAL/PLATELET
Abs Immature Granulocytes: 0.26 10*3/uL — ABNORMAL HIGH (ref 0.00–0.07)
Basophils Absolute: 0 10*3/uL (ref 0.0–0.1)
Basophils Relative: 0 %
Eosinophils Absolute: 0 10*3/uL (ref 0.0–0.5)
Eosinophils Relative: 0 %
HCT: 35.9 % — ABNORMAL LOW (ref 36.0–46.0)
Hemoglobin: 11.4 g/dL — ABNORMAL LOW (ref 12.0–15.0)
Immature Granulocytes: 3 %
Lymphocytes Relative: 24 %
Lymphs Abs: 2.3 10*3/uL (ref 0.7–4.0)
MCH: 25.9 pg — ABNORMAL LOW (ref 26.0–34.0)
MCHC: 31.8 g/dL (ref 30.0–36.0)
MCV: 81.6 fL (ref 80.0–100.0)
Monocytes Absolute: 0.8 10*3/uL (ref 0.1–1.0)
Monocytes Relative: 9 %
Neutro Abs: 6.1 10*3/uL (ref 1.7–7.7)
Neutrophils Relative %: 64 %
Platelets: 287 10*3/uL (ref 150–400)
RBC: 4.4 MIL/uL (ref 3.87–5.11)
RDW: 14.6 % (ref 11.5–15.5)
WBC: 9.5 10*3/uL (ref 4.0–10.5)
nRBC: 0 % (ref 0.0–0.2)

## 2019-09-02 LAB — FERRITIN: Ferritin: 596 ng/mL — ABNORMAL HIGH (ref 11–307)

## 2019-09-02 LAB — GLUCOSE, CAPILLARY
Glucose-Capillary: 102 mg/dL — ABNORMAL HIGH (ref 70–99)
Glucose-Capillary: 121 mg/dL — ABNORMAL HIGH (ref 70–99)
Glucose-Capillary: 152 mg/dL — ABNORMAL HIGH (ref 70–99)
Glucose-Capillary: 162 mg/dL — ABNORMAL HIGH (ref 70–99)
Glucose-Capillary: 166 mg/dL — ABNORMAL HIGH (ref 70–99)
Glucose-Capillary: 87 mg/dL (ref 70–99)
Glucose-Capillary: 89 mg/dL (ref 70–99)

## 2019-09-02 LAB — CULTURE, BLOOD (ROUTINE X 2)
Culture: NO GROWTH
Culture: NO GROWTH
Special Requests: ADEQUATE
Special Requests: ADEQUATE

## 2019-09-02 LAB — C-REACTIVE PROTEIN: CRP: 1.5 mg/dL — ABNORMAL HIGH (ref <1.0)

## 2019-09-02 LAB — LACTATE DEHYDROGENASE: LDH: 253 U/L — ABNORMAL HIGH (ref 98–192)

## 2019-09-02 LAB — MAGNESIUM: Magnesium: 2 mg/dL (ref 1.7–2.4)

## 2019-09-02 LAB — BRAIN NATRIURETIC PEPTIDE: B Natriuretic Peptide: 26.6 pg/mL (ref 0.0–100.0)

## 2019-09-02 LAB — D-DIMER, QUANTITATIVE: D-Dimer, Quant: 0.69 ug/mL-FEU — ABNORMAL HIGH (ref 0.00–0.50)

## 2019-09-02 MED ORDER — METHYLPREDNISOLONE SODIUM SUCC 40 MG IJ SOLR
20.0000 mg | Freq: Every day | INTRAMUSCULAR | Status: DC
  Administered 2019-09-03: 20 mg via INTRAVENOUS
  Filled 2019-09-02: qty 1

## 2019-09-02 NOTE — Progress Notes (Signed)
Pt declined family update.  Pt stated she called and talked to her husband about the plan of care.

## 2019-09-02 NOTE — Progress Notes (Signed)
Spoke to patient's husband and updated him on patient's status.  Husband is very excited about patient hopefully coming home tomorrow.

## 2019-09-02 NOTE — Plan of Care (Signed)
  Problem: Respiratory: Goal: Complications related to the disease process, condition or treatment will be avoided or minimized Outcome: Progressing   

## 2019-09-02 NOTE — Progress Notes (Signed)
PROGRESS NOTE                                                                                                                                                                                                             Patient Demographics:    Sophia [name], is a 68 y.o. adult, DOB - March 08, 1951, MWN:[id number]  Outpatient Primary MD for the patient is Fayrene Helper, MD    LOS - 5  Admit date - 08/28/2019    Chief Complaint  Patient presents with   Generalized Body Aches       Brief Narrative - ZI SEK is a 68 y.o. female with a history of diabetes type 2, hypertension, anxiety, depression, GERD.  Patient seen for generalized body aches, mild cough, elevated body temperature, diminished taste and smell that started 3 days prior to her hospital visit and admission.  In the ER she was diagnosed with COVID-19 pneumonitis and admitted.   Subjective:   Patient in bed, appears comfortable, denies any headache, no fever, no chest pain or pressure, no shortness of breath , no abdominal pain. No focal weakness.   Assessment  & Plan :     1. Acute Hypoxic Resp. Failure due to Acute Covid 19 Viral Pneumonitis during the ongoing 2020 Covid 19 Pandemic - she is symptom-free at rest, inflammatory markers are pending but clinically looks stable, continue on steroids which is being tapered rapidly and Remdisvir combination.  Clinically has stabilized continue present line of care and finish IV Remdisvir course.  Encouraged to sit up in chair in the daytime use flutter valve and I-S for pulmonary toiletry and prone in bed at night.  She will be monitored closely.   COVID-19 Labs  Recent Labs    08/31/19 0555 09/01/19 0410 09/02/19 0435  DDIMER 0.56* 0.56* 0.69*  FERRITIN 780* 753* 596*  LDH 257* 259* 253*  CRP 6.0* 3.3* 1.5*    Lab Results  Component Value Date   SARSCOV2NAA POSITIVE (A) 08/28/2019     Hepatic  Function Latest Ref Rng & Units 09/02/2019 09/01/2019 08/31/2019  Total Protein 6.5 - 8.1 g/dL 6.4(L) 6.9 7.0  Albumin 3.5 - 5.0 g/dL 3.0(L) 3.2(L) 3.1(L)  AST 15 - 41 U/L 33 32 26  ALT 0 - 44 U/L 61(H) 46(H) 27  Alk Phosphatase 38 - 126 U/L  56 62 60  Total Bilirubin 0.3 - 1.2 mg/dL 0.4 0.9 0.5  Bilirubin, Direct 0.0 - 0.3 mg/dL - - -        Component Value Date/Time   BNP 26.6 09/02/2019 0435     SpO2: 90 % O2 Flow Rate (L/min): 1 L/min   2.  Essential hypertension.  Stable on Norvasc, ACE and HCTZ combination.  3.  GERD.  On PPI.  4.  Diabetic peripheral neuropathy.  Stable on Neurontin.  5. Pre DM2.  Follow with PCP.  On SSI here due to steroids.  CBG (last 3)  Recent Labs    09/02/19 0021 09/02/19 0408 09/02/19 0736  GLUCAP 121* 87 89       Condition - Fair  Family Communication  :  None  Code Status :  Full  Diet :   Diet Order            Diet Heart Room service appropriate? Yes; Fluid consistency: Thin  Diet effective now               Disposition Plan  :  Inpt  Consults  :  None  Procedures  :     CT -  1. Multiple areas of consolidation in the visualized bilateral lung bases suspicious for multifocal pneumonia. 2. No acute finding in the abdomen or pelvis. Aortic Atherosclerosis   PUD Prophylaxis : PPI  DVT Prophylaxis  :  Lovenox    Lab Results  Component Value Date   PLT 287 09/02/2019    Inpatient Medications  Scheduled Meds:  amLODipine  10 mg Oral Daily   aspirin EC  81 mg Oral Daily   benazepril  40 mg Oral Daily   And   hydrochlorothiazide  25 mg Oral Daily   busPIRone  5 mg Oral TID   enoxaparin (LOVENOX) injection  40 mg Subcutaneous Q24H   FLUoxetine  40 mg Oral Daily   gabapentin  100 mg Oral TID   insulin aspart  0-15 Units Subcutaneous Q4H   methylPREDNISolone (SOLU-MEDROL) injection  40 mg Intravenous Q12H   pantoprazole  40 mg Oral Daily   pravastatin  40 mg Oral QPM   vitamin C  500 mg Oral  Daily   zinc sulfate  220 mg Oral Daily   Continuous Infusions:  PRN Meds:.acetaminophen, ALPRAZolam, chlorpheniramine-HYDROcodone, guaiFENesin-dextromethorphan, [DISCONTINUED] ondansetron **OR** ondansetron (ZOFRAN) IV, phenol  Antibiotics  :    Anti-infectives (From admission, onward)   Start     Dose/Rate Route Frequency Ordered Stop   08/30/19 1000  remdesivir 100 mg in sodium chloride 0.9 % 250 mL IVPB     100 mg 500 mL/hr over 30 Minutes Intravenous Every 24 hours 08/29/19 0105 09/02/19 1001   08/29/19 0200  remdesivir 200 mg in sodium chloride 0.9 % 250 mL IVPB     200 mg 500 mL/hr over 30 Minutes Intravenous Once 08/29/19 0105 08/29/19 0233   08/28/19 1000  cefTRIAXone (ROCEPHIN) 1 g in sodium chloride 0.9 % 100 mL IVPB     1 g 200 mL/hr over 30 Minutes Intravenous  Once 08/28/19 0950 08/28/19 1118   08/28/19 1000  azithromycin (ZITHROMAX) 500 mg in sodium chloride 0.9 % 250 mL IVPB     500 mg 250 mL/hr over 60 Minutes Intravenous  Once 08/28/19 0950 08/28/19 1345       Time Spent in minutes  Malakoff M.D on 09/02/2019 at 10:52 AM  To page go to [email or web address] -  password [email or web address]  [phone]     See all Orders from today for further details    Objective:   Vitals:   09/01/19 1702 09/01/19 2120 09/02/19 0447 09/02/19 0738  BP: 132/67 134/70 137/70 113/80  Pulse: 68 (!) 58 66 74  Resp: _0 (!) 22  Temp: 97.9 F (36.6 C) 98.2 F (36.8 C) 98 F (36.7 C) 97.8 F (36.6 C)  TempSrc: Oral Oral Oral Oral  SpO2: 94% 91% 90% 90%  Weight:      Height:        Wt Readings from Last 3 Encounters:  08/29/19 86.2 kg  07/14/19 96.2 kg  06/01/19 89.4 kg     Intake/Output Summary (Last 24 hours) at 09/02/2019 1052 Last data filed at 09/02/2019 0959 Gross per 24 hour  Intake 1690 ml  Output --  Net 1690 ml     Physical Exam  Awake Alert, Oriented X 3, No new F.N deficits, Normal affect [employer].AT,PERRAL Supple  Neck,No JVD, No cervical lymphadenopathy appriciated.  Symmetrical Chest wall movement, Good air movement bilaterally, CTAB RRR,No Gallops, Rubs or new Murmurs, No Parasternal Heave +ve B.Sounds, Abd Soft, No tenderness, No organomegaly appriciated, No rebound - guarding or rigidity. No Cyanosis, Clubbing or edema, No new Rash or bruise   Data Review:    CBC Recent Labs  Lab 08/29/19 0840 08/30/19 0215 08/31/19 0555 09/01/19 0410 09/02/19 0435  WBC 6.8 5.2 10.0 10.2 9.5  HGB 11.8* 11.9* 11.7* 11.9* 11.4*  HCT 37.2 38.1 37.1 37.7 35.9*  PLT 195 215 252 272 287  MCV 82.1 81.4 81.9 81.6 81.6  MCH 26.0 25.4* 25.8* 25.8* 25.9*  MCHC 31.7 31.2 31.5 31.6 31.8  RDW 14.7 14.7 14.8 14.7 14.6  LYMPHSABS 0.8 0.9 1.1 1.3 2.3  MONOABS 0.3 0.2 0.6 0.5 0.8  EOSABS 0.0 0.0 0.0 0.0 0.0  BASOSABS 0.0 0.0 0.0 0.0 0.0    Chemistries  Recent Labs  Lab 08/29/19 0840 08/30/19 0215 08/31/19 0555 09/01/19 0410 09/02/19 0435  NA 138 141 140 141 139  K 3.0* 3.3* 4.0 3.9 3.5  CL 97* 100 102 101 99  CO2 _1 GLUCOSE 109* 133* 136* 134* 75  BUN 10 17 24* 24* 24*  CREATININE 0.73 0.66 0.69 0.73 0.75  CALCIUM 8.5* 9.0 8.9 8.8* 8.8*  MG 1.8 2.0 2.0 2.0 2.0  AST 32 29 26 32 33  ALT _2 46* 61*  ALKPHOS 68 66 60 62 56  BILITOT 0.7 0.4 0.5 0.9 0.4   ------------------------------------------------------------------------------------------------------------------ No results for input(s): CHOL, HDL, LDLCALC, TRIG, CHOLHDL, LDLDIRECT in the last 72 hours.  Lab Results  Component Value Date   HGBA1C 6.1 (H) 05/24/2019   ------------------------------------------------------------------------------------------------------------------ No results for input(s): TSH, T4TOTAL, T3FREE, THYROIDAB in the last 72 hours.  Invalid input(s): FREET3  Cardiac Enzymes No results for input(s): CKMB, TROPONINI, MYOGLOBIN in the last 168 hours.  Invalid input(s):  CK ------------------------------------------------------------------------------------------------------------------    Component Value Date/Time   BNP 26.6 09/02/2019 0435    Micro Results Recent Results (from the past 240 hour(s))  Culture, blood (routine x 2)     Status: None   Collection Time: 08/28/19  7:47 AM   Specimen: BLOOD RIGHT FOREARM  Result Value Ref Range Status   Specimen Description BLOOD RIGHT FOREARM  Final   Special Requests   Final    BOTTLES DRAWN AEROBIC AND ANAEROBIC Blood Culture adequate volume  Culture   Final    NO GROWTH 5 DAYS Performed at [facility], [address] [place], [place] [address]    Report Status 09/02/2019 FINAL  Final  SARS Coronavirus 2 by RT PCR (hospital order, performed in [facility] hospital lab) Nasopharyngeal Nasopharyngeal Swab     Status: Abnormal   Collection Time: 08/28/19  7:47 AM   Specimen: Nasopharyngeal Swab  Result Value Ref Range Status   SARS Coronavirus 2 POSITIVE (A) NEGATIVE Final    Comment: RESULT CALLED TO, READ BACK BY AND VERIFIED WITH: HOLCOLM,R_0  BY MATTHEWS, B 10.10.2020 (NOTE) If result is NEGATIVE SARS-CoV-2 target nucleic acids are NOT DETECTED. The SARS-CoV-2 RNA is generally detectable in upper and lower  respiratory specimens during the acute phase of infection. The lowest  concentration of SARS-CoV-2 viral copies this assay can detect is 250  copies / mL. A negative result does not preclude SARS-CoV-2 infection  and should not be used as the sole basis for treatment or other  patient management decisions.  A negative result may occur with  improper specimen collection / handling, submission of specimen other  than nasopharyngeal swab, presence of viral mutation(s) within the  areas targeted by this assay, and inadequate number of viral copies  (<250 copies / mL). A negative result must be combined with clinical  observations, patient history, and epidemiological information. If result  is POSITIVE SARS-CoV-2 target nucleic acids are DETECTE D. The SARS-CoV-2 RNA is generally detectable in upper and lower  respiratory specimens during the acute phase of infection.  Positive  results are indicative of active infection with SARS-CoV-2.  Clinical  correlation with patient history and other diagnostic information is  necessary to determine patient infection status.  Positive results do  not rule out bacterial infection or co-infection with other viruses. If result is PRESUMPTIVE POSTIVE SARS-CoV-2 nucleic acids MAY BE PRESENT.   A presumptive positive result was obtained on the submitted specimen  and confirmed on repeat testing.  While 2019 novel coronavirus  (SARS-CoV-2) nucleic acids may be present in the submitted sample  additional confirmatory testing may be necessary for epidemiological  and / or clinical management purposes  to differentiate between  SARS-CoV-2 and other Sarbecovirus currently known to infect humans.  If clinically indicated additional testing with an alternate test  methodology (LAB745 3) is advised. The SARS-CoV-2 RNA is generally  detectable in upper and lower respiratory specimens during the acute  phase of infection. The expected result is Negative. Fact Sheet for Patients:  StrictlyIdeas.no Fact Sheet for Healthcare Providers: BankingDealers.co.za This test is not yet approved or cleared by the [place] FDA and has been authorized for detection and/or diagnosis of SARS-CoV-2 by FDA under an Emergency Use Authorization (EUA).  This EUA will remain in effect (meaning this test can be used) for the duration of the COVID-19 declaration under Section 564(b)(1) of the Act, 21 U.S.C. section 360bbb-3(b)(1), unless the authorization is terminated or revoked sooner. Performed at [facility], [address], [place], [place] [address]   Culture, blood (routine x 2)     Status: None   Collection  Time: 08/28/19  8:02 AM   Specimen: Left Antecubital; Blood  Result Value Ref Range Status   Specimen Description LEFT ANTECUBITAL  Final   Special Requests   Final    BOTTLES DRAWN AEROBIC AND ANAEROBIC Blood Culture adequate volume   Culture   Final    NO GROWTH 5 DAYS Performed at [facility], [place]  [address], [place], [place] [address]    Report Status 09/02/2019 FINAL  Final  Urine Culture     Status: Abnormal   Collection Time: 08/28/19  8:43 AM   Specimen: Urine, Clean Catch  Result Value Ref Range Status   Specimen Description   Final    URINE, CLEAN CATCH Performed at [facility], [address] [place], [place] [address]    Special Requests   Final    NONE Performed at [facility], [address], [place], [place] [address]    Culture (A)  Final    <10,000 COLONIES/mL INSIGNIFICANT GROWTH Performed at [facility] [address], [place], [place] [address]    Report Status 08/30/2019 FINAL  Final    Radiology Reports Ct Abdomen Pelvis W Contrast  Result Date: 08/28/2019 CLINICAL DATA:  Per ED orders.Patient presents with low-grade fever, loss of taste and smell, vomiting and diarrhea for the past 3 days. Hx diabetes Diet controlled, htn/ bj COVID positive. EXAM: CT ABDOMEN AND PELVIS WITH CONTRAST TECHNIQUE: Multidetector CT imaging of the abdomen and pelvis was performed using the standard protocol following bolus administration of intravenous contrast. CONTRAST:  154m OMNIPAQUE IOHEXOL 300 MG/ML  SOLN COMPARISON:  CT abdomen pelvis 05/31/2019 FINDINGS: Lower chest: Multifocal consolidation in the visualized bilateral lung bases suspicious for infection. No pleural effusion. Small hiatal hernia. Hepatobiliary: No focal liver abnormality is seen. No gallstones or gallbladder wall thickening prominent common bile duct measuring 1.2 cm in diameter, chronic. No new intra or extrahepatic biliary duct dilation. Pancreas: Unremarkable. No pancreatic ductal dilatation or  surrounding inflammatory changes. Spleen: Normal in size without focal abnormality. Adrenals/Urinary Tract: Left greater than right thickening of the adrenal glands favored to represent hyperplasia. There are a few tiny hypodensities in the bilateral kidneys are too small fully characterize. No renal calculi or hydronephrosis. Bladder is unremarkable. Stomach/Bowel: Stomach is within normal limits. Appendix is not definitely visualized and may be surgically absent. No evidence of bowel wall thickening, distention, or inflammatory changes. Vascular/Lymphatic: Moderate aortoiliac atherosclerotic calcification without evidence of aneurysm. No enlarged abdominal or pelvic lymph nodes. Reproductive: Status post hysterectomy. Other: No abdominal wall hernia or abnormality. No abdominopelvic ascites. Musculoskeletal: No acute osseous abnormality. Degenerative changes at the pubic symphysis. IMPRESSION: 1. Multiple areas of consolidation in the visualized bilateral lung bases suspicious for multifocal pneumonia. 2. No acute finding in the abdomen or pelvis. Aortic Atherosclerosis (ICD10-I70.0). Electronically Signed   By: NAudie PintoM.D.   On: 08/28/2019 13:51   Dg Chest Port 1 View  Result Date: 08/28/2019 CLINICAL DATA:  Body aches EXAM: PORTABLE CHEST 1 VIEW COMPARISON:  06/22/2018 FINDINGS: Increased opacity at the right lung base. Cardiomediastinal contours are normal. No pneumothorax or sizable pleural effusion. IMPRESSION: Increased opacity at the right lung base may indicate developing consolidation. Electronically Signed   By: KUlyses JarredM.D.   On: 08/28/2019 07:00

## 2019-09-02 NOTE — Progress Notes (Signed)
Pt ambulated in hallway.  Pt's o2 assessed before ambulation.  Pt's o2 94% on room air.  Pt's oxygen level dropped to 87% during ambulation twice. Pt denied chest pain, shortness of breath, dizziness, and lightheadedness.  Pt's o2 increased back to 93% without stopping.  Pt was returned back to the room.  Pt's o2 post ambulation was 94% on room air.  Will continue to monitor.

## 2019-09-03 LAB — MAGNESIUM: Magnesium: 2 mg/dL (ref 1.7–2.4)

## 2019-09-03 LAB — BASIC METABOLIC PANEL
Anion gap: 11 (ref 5–15)
BUN: 21 mg/dL (ref 8–23)
CO2: 29 mmol/L (ref 22–32)
Calcium: 8.7 mg/dL — ABNORMAL LOW (ref 8.9–10.3)
Chloride: 100 mmol/L (ref 98–111)
Creatinine, Ser: 0.64 mg/dL (ref 0.44–1.00)
GFR calc Af Amer: >60 mL/min (ref >60)
GFR calc non Af Amer: >60 mL/min (ref >60)
Glucose, Bld: 69 mg/dL — ABNORMAL LOW (ref 70–99)
Potassium: 3.5 mmol/L (ref 3.5–5.1)
Sodium: 140 mmol/L (ref 135–145)

## 2019-09-03 LAB — GLUCOSE, CAPILLARY
Glucose-Capillary: 77 mg/dL (ref 70–99)
Glucose-Capillary: 73 mg/dL (ref 70–99)

## 2019-09-03 MED ORDER — POTASSIUM CHLORIDE CRYS ER 20 MEQ PO TBCR
40.0000 meq | EXTENDED_RELEASE_TABLET | Freq: Once | ORAL | Status: AC
  Administered 2019-09-03: 40 meq via ORAL
  Filled 2019-09-03: qty 2

## 2019-09-03 NOTE — Progress Notes (Signed)
Discharge instructions completed with pt.  Pt verbalized understanding of the information.  Pt denies chest pain, shortness of breath, dizziness, lightheadedness, and n/v.  Pt's IV discontinued.  Pt discharged home.  

## 2019-09-03 NOTE — Discharge Summary (Signed)
Sophia [name] [id number] DOB: 04-28-1951 DOA: 08/28/2019  PCP: Sophia Helper, MD  Admit date: 08/28/2019  Discharge date: 09/03/2019  Admitted From: Home   Disposition:  Home   Recommendations for Outpatient Follow-up:   Follow up with PCP in 1-2 weeks  PCP Please obtain BMP/CBC, 2 view CXR in 1week,  (see Discharge instructions)   PCP Please follow up on the following pending results:    Home Health: None   Equipment/Devices: None  Consultations: None  Discharge Condition: Stable    CODE STATUS: Full    Diet Recommendation: Heart Healthy     Chief Complaint  Patient presents with   Generalized Body Aches     Brief history of present illness from the day of admission and additional interim summary    Sophia L Thomasis a 68 y.o.femalewith a history of diabetes type 2, hypertension, anxiety, depression, GERD. Patient seen for generalized body aches, mild cough, elevated body temperature, diminished taste and smell that started 3 days prior to her hospital visit and admission.  In the ER she was diagnosed with COVID-19 pneumonitis and admitted.                                                                  Hospital Course   1. 1. Acute Hypoxic Resp. Failure due to Acute Covid 19 Viral Pneumonitis during the ongoing 2020 Covid 19 Pandemic - she was treated with IV Remdisvir and steroid combination to good effect, completely symptom-free on room air now.  Upon exertion she is staying above 92% without any distress on room air.  Will be discharged with outpatient PCP follow-up in a week.   COVID-19 Labs  Recent Labs    09/01/19 0410 09/02/19 0435  DDIMER 0.56* 0.69*  FERRITIN 753* 596*  LDH 259* 253*  CRP 3.3* 1.5*    Lab Results  Component Value Date   SARSCOV2NAA POSITIVE (A)  08/28/2019     2.  Essential hypertension.  Stable on Norvasc, ACE and HCTZ combination.  3.  GERD.  On PPI.  4.  Diabetic peripheral neuropathy.  Stable on Neurontin.  5. Pre DM2.  Follow with PCP.      Discharge diagnosis     Principal Problem:   Acute respiratory disease due to COVID-19 virus Active Problems:   Essential hypertension   GERD   GAD (generalized anxiety disorder)   Prediabetes    Discharge instructions    Discharge Instructions    Diet - low sodium heart healthy   Complete by: As directed    Discharge instructions   Complete by: As directed    Follow with Primary MD Sophia Helper, MD in 7 days   Get CBC, CMP, 2 view Chest X ray -  checked next visit within 1 week  by Primary MD   Activity: As tolerated with Full fall precautions use walker/cane & assistance as needed  Disposition Home   Diet: Heart Healthy    Special Instructions: If you have smoked or chewed Tobacco  in the last 2 yrs please stop smoking, stop any regular Alcohol  and or any Recreational drug use.  On your next visit with your primary care physician please Get Medicines reviewed and adjusted.  Please request your Prim.MD to go over all Hospital Tests and Procedure/Radiological results at the follow up, please get all Hospital records sent to your Prim MD by signing hospital release before you go home.  If you experience worsening of your admission symptoms, develop shortness of breath, life threatening emergency, suicidal or homicidal thoughts you must seek medical attention immediately by calling 911 or calling your MD immediately  if symptoms less severe.  You Must read complete instructions/literature along with all the possible adverse reactions/side effects for all the Medicines you take and that have been prescribed to you. Take any new Medicines after you have completely understood and accpet all the possible adverse reactions/side effects.   Increase activity  slowly   Complete by: As directed    MyChart COVID-19 home monitoring program   Complete by: Sep 03, 2019    Is the patient willing to use the [place] for home monitoring?: Yes   Temperature monitoring   Complete by: Sep 03, 2019    After how many days would you like to receive a notification of this patient's flowsheet entries?: 1      Discharge Medications   Allergies as of 09/03/2019   No Known Allergies     Medication List    TAKE these medications   ALPRAZolam 1 MG tablet Commonly known as: XANAX Take 0.5 to 1 tablet two times weekly for panic and uncontrolled anxiety   amLODipine 10 MG tablet Commonly known as: NORVASC Take 1 tablet (10 mg total) by mouth daily.   aspirin EC 81 MG tablet Take 81 mg by mouth daily.   AZO-CRANBERRY PO Take 2 tablets by mouth 2 (two) times daily as needed (UTI).   benazepril-hydrochlorthiazide 20-12.5 MG tablet Commonly known as: LOTENSIN HCT TAKE 2 TABLETS EVERY DAY   busPIRone 5 MG tablet Commonly known as: BUSPAR Take 1 tablet (5 mg total) by mouth 3 (three) times daily.   FLUoxetine 40 MG capsule Commonly known as: PROZAC Take 1 capsule (40 mg total) by mouth daily.   gabapentin 100 MG capsule Commonly known as: NEURONTIN Take 100 mg by mouth 3 (three) times daily.   multivitamin with minerals Tabs tablet Take 1 tablet by mouth daily.   Omega-3 500 MG Caps Take 1 capsule by mouth daily.   pantoprazole 40 MG tablet Commonly known as: PROTONIX Take 1 tablet (40 mg total) by mouth daily.   pravastatin 40 MG tablet Commonly known as: PRAVACHOL Take 1 tablet (40 mg total) by mouth every evening.       Follow-up Information    Sophia Helper, MD. Schedule an appointment as soon as possible for a visit in 1 week(s).   Specialty: Family Medicine Contact information: [address] [place] [address] [phone]        Josue Hector, MD .   Specialty: Cardiology Contact  information: [phone] [address] [place] [address] [phone]           Major procedures and Radiology Reports - PLEASE  review detailed and final reports thoroughly  -         Ct Abdomen Pelvis W Contrast  Result Date: 08/28/2019 CLINICAL DATA:  Per ED orders.Patient presents with low-grade fever, loss of taste and smell, vomiting and diarrhea for the past 3 days. Hx diabetes Diet controlled, htn/ bj COVID positive. EXAM: CT ABDOMEN AND PELVIS WITH CONTRAST TECHNIQUE: Multidetector CT imaging of the abdomen and pelvis was performed using the standard protocol following bolus administration of intravenous contrast. CONTRAST:  148mL OMNIPAQUE IOHEXOL 300 MG/ML  SOLN COMPARISON:  CT abdomen pelvis 05/31/2019 FINDINGS: Lower chest: Multifocal consolidation in the visualized bilateral lung bases suspicious for infection. No pleural effusion. Small hiatal hernia. Hepatobiliary: No focal liver abnormality is seen. No gallstones or gallbladder wall thickening prominent common bile duct measuring 1.2 cm in diameter, chronic. No new intra or extrahepatic biliary duct dilation. Pancreas: Unremarkable. No pancreatic ductal dilatation or surrounding inflammatory changes. Spleen: Normal in size without focal abnormality. Adrenals/Urinary Tract: Left greater than right thickening of the adrenal glands favored to represent hyperplasia. There are a few tiny hypodensities in the bilateral kidneys are too small fully characterize. No renal calculi or hydronephrosis. Bladder is unremarkable. Stomach/Bowel: Stomach is within normal limits. Appendix is not definitely visualized and may be surgically absent. No evidence of bowel wall thickening, distention, or inflammatory changes. Vascular/Lymphatic: Moderate aortoiliac atherosclerotic calcification without evidence of aneurysm. No enlarged abdominal or pelvic lymph nodes. Reproductive: Status post hysterectomy. Other: No abdominal wall hernia or  abnormality. No abdominopelvic ascites. Musculoskeletal: No acute osseous abnormality. Degenerative changes at the pubic symphysis. IMPRESSION: 1. Multiple areas of consolidation in the visualized bilateral lung bases suspicious for multifocal pneumonia. 2. No acute finding in the abdomen or pelvis. Aortic Atherosclerosis (ICD10-I70.0). Electronically Signed   By: Audie Pinto M.D.   On: 08/28/2019 13:51   Dg Chest Port 1 View  Result Date: 08/28/2019 CLINICAL DATA:  Body aches EXAM: PORTABLE CHEST 1 VIEW COMPARISON:  06/22/2018 FINDINGS: Increased opacity at the right lung base. Cardiomediastinal contours are normal. No pneumothorax or sizable pleural effusion. IMPRESSION: Increased opacity at the right lung base may indicate developing consolidation. Electronically Signed   By: Ulyses Jarred M.D.   On: 08/28/2019 07:00    Micro Results     Recent Results (from the past 240 hour(s))  Culture, blood (routine x 2)     Status: None   Collection Time: 08/28/19  7:47 AM   Specimen: BLOOD RIGHT FOREARM  Result Value Ref Range Status   Specimen Description BLOOD RIGHT FOREARM  Final   Special Requests   Final    BOTTLES DRAWN AEROBIC AND ANAEROBIC Blood Culture adequate volume   Culture   Final    NO GROWTH 5 DAYS Performed at [facility], [address] [place], [place] [address]    Report Status 09/02/2019 FINAL  Final  SARS Coronavirus 2 by RT PCR (hospital order, performed in [place] hospital lab) Nasopharyngeal Nasopharyngeal Swab     Status: Abnormal   Collection Time: 08/28/19  7:47 AM   Specimen: Nasopharyngeal Swab  Result Value Ref Range Status   SARS Coronavirus 2 POSITIVE (A) NEGATIVE Final    Comment: RESULT CALLED TO, READ BACK BY AND VERIFIED WITH: HOLCOLM,R@[email or web address]  BY MATTHEWS, B 10.10.2020 (NOTE) If result is NEGATIVE SARS-CoV-2 target nucleic acids are NOT DETECTED. The SARS-CoV-2 RNA is generally detectable in upper and lower  respiratory specimens during the  acute phase of infection. The lowest  concentration of SARS-CoV-2  viral copies this assay can detect is 250  copies / mL. A negative result does not preclude SARS-CoV-2 infection  and should not be used as the sole basis for treatment or other  patient management decisions.  A negative result may occur with  improper specimen collection / handling, submission of specimen other  than nasopharyngeal swab, presence of viral mutation(s) within the  areas targeted by this assay, and inadequate number of viral copies  (<250 copies / mL). A negative result must be combined with clinical  observations, patient history, and epidemiological information. If result is POSITIVE SARS-CoV-2 target nucleic acids are DETECTE D. The SARS-CoV-2 RNA is generally detectable in upper and lower  respiratory specimens during the acute phase of infection.  Positive  results are indicative of active infection with SARS-CoV-2.  Clinical  correlation with patient history and other diagnostic information is  necessary to determine patient infection status.  Positive results do  not rule out bacterial infection or co-infection with other viruses. If result is PRESUMPTIVE POSTIVE SARS-CoV-2 nucleic acids MAY BE PRESENT.   A presumptive positive result was obtained on the submitted specimen  and confirmed on repeat testing.  While 2019 novel coronavirus  (SARS-CoV-2) nucleic acids may be present in the submitted sample  additional confirmatory testing may be necessary for epidemiological  and / or clinical management purposes  to differentiate between  SARS-CoV-2 and other Sarbecovirus currently known to infect humans.  If clinically indicated additional testing with an alternate test  methodology (LAB745 3) is advised. The SARS-CoV-2 RNA is generally  detectable in upper and lower respiratory specimens during the acute  phase of infection. The expected result is Negative. Fact Sheet for Patients:   StrictlyIdeas.no Fact Sheet for Healthcare Providers: BankingDealers.co.za This test is not yet approved or cleared by the [place] FDA and has been authorized for detection and/or diagnosis of SARS-CoV-2 by FDA under an Emergency Use Authorization (EUA).  This EUA will remain in effect (meaning this test can be used) for the duration of the COVID-19 declaration under Section 564(b)(1) of the Act, 21 U.S.C. section 360bbb-3(b)(1), unless the authorization is terminated or revoked sooner. Performed at [facility], [address], [place], [place] [address]   Culture, blood (routine x 2)     Status: None   Collection Time: 08/28/19  8:02 AM   Specimen: Left Antecubital; Blood  Result Value Ref Range Status   Specimen Description LEFT ANTECUBITAL  Final   Special Requests   Final    BOTTLES DRAWN AEROBIC AND ANAEROBIC Blood Culture adequate volume   Culture   Final    NO GROWTH 5 DAYS Performed at [facility], [address] [place], [place] [address]    Report Status 09/02/2019 FINAL  Final  Urine Culture     Status: Abnormal   Collection Time: 08/28/19  8:43 AM   Specimen: Urine, Clean Catch  Result Value Ref Range Status   Specimen Description   Final    URINE, CLEAN CATCH Performed at [facility], [address] [place], [place] [address]    Special Requests   Final    NONE Performed at [facility], [address], [place], [place] [address]    Culture (A)  Final    <10,000 COLONIES/mL INSIGNIFICANT GROWTH Performed at [facility], [place] [address] [place],  [address]    Report Status 08/30/2019 FINAL  Final    Today   Subjective    Lasya Woolf today has  no headache,no chest abdominal pain,no new weakness tingling or numbness, feels much better wants to go home today.     Objective   Blood pressure 138/70, pulse 65, temperature 97.8 F (36.6 C), temperature source Oral, resp. rate 16, height 5\' 9"   (1.753 m), weight 86.2 kg, SpO2 94 %.   Intake/Output Summary (Last 24 hours) at 09/03/2019 0859 Last data filed at 09/03/2019 0400 Gross per 24 hour  Intake 1920 ml  Output --  Net 1920 ml    Exam Awake Alert, Oriented x 3, No new F.N deficits, Normal affect [place].AT,PERRAL Supple Neck,No JVD, No cervical lymphadenopathy appriciated.  Symmetrical Chest wall movement, Good air movement bilaterally, CTAB RRR,No Gallops,Rubs or new Murmurs, No Parasternal Heave +ve B.Sounds, Abd Soft, Non tender, No organomegaly appriciated, No rebound -guarding or rigidity. No Cyanosis, Clubbing or edema, No new Rash or bruise   Data Review   CBC w Diff:  Lab Results  Component Value Date   WBC 9.5 09/02/2019   HGB 11.4 (L) 09/02/2019   HCT 35.9 (L) 09/02/2019   PLT 287 09/02/2019   LYMPHOPCT 24 09/02/2019   MONOPCT 9 09/02/2019   EOSPCT 0 09/02/2019   BASOPCT 0 09/02/2019    CMP:  Lab Results  Component Value Date   NA 140 09/03/2019   K 3.5 09/03/2019   CL 100 09/03/2019   CO2 29 09/03/2019   BUN 21 09/03/2019   CREATININE 0.64 09/03/2019   CREATININE 0.74 05/24/2019   PROT 6.4 (L) 09/02/2019   ALBUMIN 3.0 (L) 09/02/2019   BILITOT 0.4 09/02/2019   ALKPHOS 56 09/02/2019   AST 33 09/02/2019   ALT 61 (H) 09/02/2019  .   Total Time in preparing paper work, data evaluation and todays exam - 74 minutes  Lala Lund M.D on 09/03/2019 at [place]  [phone]

## 2019-09-03 NOTE — Discharge Instructions (Signed)
Follow with Primary MD Fayrene Helper, MD in 7 days   Get CBC, CMP, 2 view Chest X ray -  checked next visit within 1 week by Primary MD   Activity: As tolerated with Full fall precautions use walker/cane & assistance as needed  Disposition Home   Diet: Heart Healthy    Special Instructions: If you have smoked or chewed Tobacco  in the last 2 yrs please stop smoking, stop any regular Alcohol  and or any Recreational drug use.  On your next visit with your primary care physician please Get Medicines reviewed and adjusted.  Please request your Prim.MD to go over all Hospital Tests and Procedure/Radiological results at the follow up, please get all Hospital records sent to your Prim MD by signing hospital release before you go home.  If you experience worsening of your admission symptoms, develop shortness of breath, life threatening emergency, suicidal or homicidal thoughts you must seek medical attention immediately by calling 911 or calling your MD immediately  if symptoms less severe.  You Must read complete instructions/literature along with all the possible adverse reactions/side effects for all the Medicines you take and that have been prescribed to you. Take any new Medicines after you have completely understood and accpet all the possible adverse reactions/side effects.       Person Under Monitoring Name: Sophia [name]  Location: [place] [address]   Infection Prevention Recommendations for Individuals Confirmed to have, or Being Evaluated for, 2019 Novel Coronavirus (COVID-19) Infection Who Receive Care at Home  Individuals who are confirmed to have, or are being evaluated for, COVID-19 should follow the prevention steps below until a healthcare provider or local or state health department says they can return to normal activities.  Stay home except to get medical care You should restrict activities outside your home, except for getting medical care. Do  not go to work, school, or public areas, and do not use public transportation or taxis.  Call ahead before visiting your doctor Before your medical appointment, call the healthcare provider and tell them that you have, or are being evaluated for, COVID-19 infection. This will help the healthcare providers office take steps to keep other people from getting infected. Ask your healthcare provider to call the local or state health department.  Monitor your symptoms Seek prompt medical attention if your illness is worsening (e.g., difficulty breathing). Before going to your medical appointment, call the healthcare provider and tell them that you have, or are being evaluated for, COVID-19 infection. Ask your healthcare provider to call the local or state health department.  Wear a facemask You should wear a facemask that covers your nose and mouth when you are in the same room with other people and when you visit a healthcare provider. People who live with or visit you should also wear a facemask while they are in the same room with you.  Separate yourself from other people in your home As much as possible, you should stay in a different room from other people in your home. Also, you should use a separate bathroom, if available.  Avoid sharing household items You should not share dishes, drinking glasses, cups, eating utensils, towels, bedding, or other items with other people in your home. After using these items, you should wash them thoroughly with soap and water.  Cover your coughs and sneezes Cover your mouth and nose with a tissue when you cough or sneeze, or you can cough or sneeze  into your sleeve. Throw used tissues in a lined trash can, and immediately wash your hands with soap and water for at least 20 seconds or use an alcohol-based hand rub.  Wash your [facility] your hands often and thoroughly with soap and water for at least 20 seconds. You can use an alcohol-based  hand sanitizer if soap and water are not available and if your hands are not visibly dirty. Avoid touching your eyes, nose, and mouth with unwashed hands.   Prevention Steps for Caregivers and Household Members of Individuals Confirmed to have, or Being Evaluated for, COVID-19 Infection Being Cared for in the Home  If you live with, or provide care at home for, a person confirmed to have, or being evaluated for, COVID-19 infection please follow these guidelines to prevent infection:  Follow healthcare providers instructions Make sure that you understand and can help the patient follow any healthcare provider instructions for all care.  Provide for the patients basic needs You should help the patient with basic needs in the home and provide support for getting groceries, prescriptions, and other personal needs.  Monitor the patients symptoms If they are getting sicker, call his or her medical provider and tell them that the patient has, or is being evaluated for, COVID-19 infection. This will help the healthcare providers office take steps to keep other people from getting infected. Ask the healthcare provider to call the local or state health department.  Limit the number of people who have contact with the patient  If possible, have only one caregiver for the patient.  Other household members should stay in another home or place of residence. If this is not possible, they should stay  in another room, or be separated from the patient as much as possible. Use a separate bathroom, if available.  Restrict visitors who do not have an essential need to be in the home.  Keep older adults, very young children, and other sick people away from the patient Keep older adults, very young children, and those who have compromised immune systems or chronic health conditions away from the patient. This includes people with chronic heart, lung, or kidney conditions, diabetes, and  cancer.  Ensure good ventilation Make sure that shared spaces in the home have good air flow, such as from an air conditioner or an opened window, weather permitting.  Wash your hands often  Wash your hands often and thoroughly with soap and water for at least 20 seconds. You can use an alcohol based hand sanitizer if soap and water are not available and if your hands are not visibly dirty.  Avoid touching your eyes, nose, and mouth with unwashed hands.  Use disposable paper towels to dry your hands. If not available, use dedicated cloth towels and replace them when they become wet.  Wear a facemask and gloves  Wear a disposable facemask at all times in the room and gloves when you touch or have contact with the patients blood, body fluids, and/or secretions or excretions, such as sweat, saliva, sputum, nasal mucus, vomit, urine, or feces.  Ensure the mask fits over your nose and mouth tightly, and do not touch it during use.  Throw out disposable facemasks and gloves after using them. Do not reuse.  Wash your hands immediately after removing your facemask and gloves.  If your personal clothing becomes contaminated, carefully remove clothing and launder. Wash your hands after handling contaminated clothing.  Place all used disposable facemasks, gloves, and other waste in  a lined container before disposing them with other household waste.  Remove gloves and wash your hands immediately after handling these items.  Do not share dishes, glasses, or other household items with the patient  Avoid sharing household items. You should not share dishes, drinking glasses, cups, eating utensils, towels, bedding, or other items with a patient who is confirmed to have, or being evaluated for, COVID-19 infection.  After the person uses these items, you should wash them thoroughly with soap and water.  Wash laundry thoroughly  Immediately remove and wash clothes or bedding that have blood, body  fluids, and/or secretions or excretions, such as sweat, saliva, sputum, nasal mucus, vomit, urine, or feces, on them.  Wear gloves when handling laundry from the patient.  Read and follow directions on labels of laundry or clothing items and detergent. In general, wash and dry with the warmest temperatures recommended on the label.  Clean all areas the individual has used often  Clean all touchable surfaces, such as counters, tabletops, doorknobs, bathroom fixtures, toilets, phones, keyboards, tablets, and bedside tables, every day. Also, clean any surfaces that may have blood, body fluids, and/or secretions or excretions on them.  Wear gloves when cleaning surfaces the patient has come in contact with.  Use a diluted bleach solution (e.g., dilute bleach with 1 part bleach and 10 parts water) or a household disinfectant with a label that says EPA-registered for coronaviruses. To make a bleach solution at home, add 1 tablespoon of bleach to 1 quart (4 cups) of water. For a larger supply, add  cup of bleach to 1 gallon (16 cups) of water.  Read labels of cleaning products and follow recommendations provided on product labels. Labels contain instructions for safe and effective use of the cleaning product including precautions you should take when applying the product, such as wearing gloves or eye protection and making sure you have good ventilation during use of the product.  Remove gloves and wash hands immediately after cleaning.  Monitor yourself for signs and symptoms of illness Caregivers and household members are considered close contacts, should monitor their health, and will be asked to limit movement outside of the home to the extent possible. Follow the monitoring steps for close contacts listed on the symptom monitoring form.   ? If you have additional questions, contact your local health department or call the epidemiologist on call at [phone] (available 24/7). ? This  guidance is subject to change. For the most up-to-date guidance from [facility], please refer to their website: [email or web address]

## 2019-09-03 NOTE — Plan of Care (Signed)
  Problem: Education: Goal: Knowledge of risk factors and measures for prevention of condition will improve Outcome: Progressing   

## 2019-09-06 ENCOUNTER — Telehealth: Payer: Self-pay

## 2019-09-06 NOTE — Telephone Encounter (Signed)
Transition Care Management Follow-up Telephone Call   Date discharged? 09/03/2019              How have you been since you were released from the hospital? I feel pretty good, just get tired easy.   Do you understand why you were in the hospital? Covid 19   Do you understand the discharge instructions? yes   Where were you discharged to? home   Items Reviewed:  Medications reviewed: yes  Allergies reviewed: yes  Dietary changes reviewed: yes  Referrals reviewed: yes   Functional Questionnaire:   Activities of Daily Living (ADLs):  yes and has help if needed    Any transportation issues/concerns?: no   Any patient concerns? no   Confirmed importance and date/time of follow-up visits scheduled 09/10/2019     Confirmed with patient if condition begins to worsen call PCP or go to the ER.  Patient was given the office number and encouraged to call back with question or concerns.yes with verbal umderstanding

## 2019-09-10 ENCOUNTER — Other Ambulatory Visit: Payer: Self-pay

## 2019-09-10 ENCOUNTER — Encounter: Payer: Self-pay | Admitting: Family Medicine

## 2019-09-10 ENCOUNTER — Ambulatory Visit (INDEPENDENT_AMBULATORY_CARE_PROVIDER_SITE_OTHER): Payer: Medicare PPO | Admitting: Family Medicine

## 2019-09-10 DIAGNOSIS — J1289 Other viral pneumonia: Secondary | ICD-10-CM

## 2019-09-10 DIAGNOSIS — R7303 Prediabetes: Secondary | ICD-10-CM | POA: Diagnosis not present

## 2019-09-10 DIAGNOSIS — E669 Obesity, unspecified: Secondary | ICD-10-CM | POA: Diagnosis not present

## 2019-09-10 DIAGNOSIS — F411 Generalized anxiety disorder: Secondary | ICD-10-CM | POA: Diagnosis not present

## 2019-09-10 DIAGNOSIS — U071 COVID-19: Secondary | ICD-10-CM | POA: Diagnosis not present

## 2019-09-10 DIAGNOSIS — Z7689 Persons encountering health services in other specified circumstances: Secondary | ICD-10-CM | POA: Diagnosis not present

## 2019-09-10 DIAGNOSIS — I10 Essential (primary) hypertension: Secondary | ICD-10-CM

## 2019-09-10 DIAGNOSIS — J1282 Pneumonia due to coronavirus disease 2019: Secondary | ICD-10-CM

## 2019-09-10 NOTE — Progress Notes (Signed)
Virtual Visit via Telephone Note   This visit type was conducted due to national recommendations for restrictions regarding the COVID-19 Pandemic (e.g. social distancing) in an effort to limit this patient's exposure and mitigate transmission in our community.  Due to her co-morbid illnesses, this patient is at least at moderate risk for complications without adequate follow up.  This format is felt to be most appropriate for this patient at this time.  The patient did not have access to video technology/had technical difficulties with video requiring transitioning to audio format only (telephone).  All issues noted in this document were discussed and addressed.  No physical exam could be performed with this format.     Evaluation Performed:  Follow-up visit  Date:  09/10/2019   ID:  Sophia [name], DOB [birth year], MRN [id number]  Patient Location: Home Provider Location: Office  Location of Patient: Home Location of Provider: Telehealth Consent was obtain for visit to be over via telehealth. I verified that I am speaking with the correct person using two identifiers.  PCP:  Fayrene Helper, MD   Chief Complaint:  TOC   History of Present Illness:    Sophia [name] is a 68 y.o. adult with history of diabetes type 2, hypertension, anxiety, depression, GERD.  Patient was admitted after being seen for generalized body aches, mild cough, elevated body temperature, diminished taste and smell that started 3 days prior to her hospital visit it admission.  In the ER she was diagnosed with COVID-19 pneumonitis and admitted on October 10.   Acute hypoxic respiratory failure due to Covid viral pneumonitis during the COVID-19 pandemic.  She was treated with IV remdesivir and steroid combination to good effect.  Completely symptom-free was on room air at discharge.  Upon exertion she was staying around 92% without any distress on room air.  Today she reports that she is feeling okay as long  as she does not stand for long periods of time.  She denies having any falls does not have to use any assistive devices.  She reports that she is eating well and drinking water well.  She reports that she is sleeping well and without any disturbance.  She denies having any skin infections breakdown or wounds.  She denies having any changes in her memory or any confusion.  She denies have any changes in her bowel or bladder.  Denies any incontinence issues.  When she first came home she reported that she had some sweating but that is much better now.  She denies having any fevers, chills, cough, shortness of breath that is new.  Of note her brother lives with her he is a cancer patient they had him tested after she was sick.  He was positive he is quarantined to one room.  She reports that she is practicing good hand hygiene and wearing her mask.  She is very concerned about him as his chemotherapy has been put on hold.  The patient does not have symptoms concerning for COVID-19 infection (fever, chills, cough, or new shortness of breath).   Past Medical, Surgical, Social History, Allergies, and Medications have been Reviewed.   Past Medical History:  Diagnosis Date   Anxiety    Arthritis    Depression    Diabetes mellitus type 2 in obese ([place]) 11/28/2010   Qualifier: Diagnosis of  By: Moshe Cipro MD, Margaret  Diet controlled in 12/2016    Diabetes mellitus without complication ([place])    GERD (  gastroesophageal reflux disease)    Hypercholesteremia    Hypertension    PONV (postoperative nausea and vomiting)    Past Surgical History:  Procedure Laterality Date   ABDOMINAL HYSTERECTOMY  1982   tubes and womb, bleeding and ectopic   ABDOMINAL SURGERY     removal of tumors   BREAST SURGERY Left 1983   benign tumor   CATARACT EXTRACTION W/PHACO Right 01/11/2014   Procedure: CATARACT EXTRACTION PHACO AND INTRAOCULAR LENS PLACEMENT ([place]);  Surgeon: Elta Guadeloupe T. Gershon Crane, MD;  Location: [facility];   Service: Ophthalmology;  Laterality: Right;  CDE 5.57   CATARACT EXTRACTION W/PHACO Left 01/25/2014   Procedure: CATARACT EXTRACTION PHACO AND INTRAOCULAR LENS PLACEMENT ([facility]);  Surgeon: Elta Guadeloupe T. Gershon Crane, MD;  Location: [facility];  Service: Ophthalmology;  Laterality: Left;  CDE:5.19   COLONOSCOPY  2007 [facility]   NL EXAM   COLONOSCOPY N/A 04/22/2014   Dr. Fields:Normal mucosa in the terminal ileum/Two COLON polyps REMOVED/ Mild diverticulosis in the ascending colon and transverse colon/The LEFT colon IS redundant/Small internal hemorrhoids. Path: tubular adenoma. Next colonoscopy in 5-10 years   ESOPHAGOGASTRODUODENOSCOPY N/A 04/22/2014   Dr. Fields:MICROCYTIC ANEMIA MOST LIKELY DUE TO ASA/VOLTAREN/Small hiatal hernia/MODERATE Non-erosive gastritis. 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 Gershon Crane, MD;  Location: [facility];  Service: Ophthalmology;  Laterality: Right;   YAG LASER APPLICATION Left [id number]   Procedure: YAG LASER APPLICATION;  Surgeon: Rutherford Guys, MD;  Location: [facility];  Service: Ophthalmology;  Laterality: Left;     No outpatient medications have been marked as taking for the 09/10/19 encounter (Appointment) with Perlie Mayo, NP.     Allergies:   Patient has no known allergies.   Social History   Tobacco Use   Smoking status: Former Smoker    Packs/day: 0.25    Years: 20.00     Pack years: 5.00    Types: Cigarettes    Quit date: 01/05/1990    Years since quitting: 29.6   Smokeless tobacco: Current User    Types: Snuff  Substance Use Topics   Alcohol use: No   Drug use: Yes    Types: Marijuana     Family Hx: The patient's family history includes Arthritis in her father; CAD in her paternal grandmother; Cancer in her father; Colon polyps (age of onset: 91) in her sister; Diabetes in her sister and sister; Hypertension in her father, sister, and sister; Kidney disease in her sister. There is no history of Colon cancer.  ROS:   Please see the history of present illness.    All other systems reviewed and are negative.   Labs/Other Tests and Data Reviewed:    Recent Labs: 05/24/2019: TSH 2.03 09/02/2019: ALT 61; B Natriuretic Peptide 26.6; Hemoglobin 11.4; Platelets 287 09/03/2019: BUN 21; Creatinine, Ser 0.64; Magnesium 2.0; Potassium 3.5; Sodium 140   Recent Lipid Panel Lab Results  Component Value Date/Time  CHOL 171 05/24/2019 07:35 AM   TRIG 98 08/28/2019 08:43 AM   HDL 57 05/24/2019 07:35 AM   CHOLHDL 3.0 05/24/2019 07:35 AM   LDLCALC 92 05/24/2019 07:35 AM    Wt Readings from Last 3 Encounters:  08/29/19 190 lb (86.2 kg)  07/14/19 212 lb (96.2 kg)  06/01/19 197 lb (89.4 kg)     Objective:    Vital Signs:  There were no vitals taken for this visit.   GEN:  alert and oriented RESPIRATORY:  no shortness of breath noted on conversation PSYCH:  Normal affect and mood, good communication and pleasant  ASSESSMENT & PLAN:    1. Encounter for support and coordination of transition of care As documented above.  Recheck of CBC and being that and two-view x-ray ordered today.  Patient reports that she is unable to get it today we will get it first thing Monday.  Denies having any signs and symptoms of active Covid at this time.  Reports that she is feeling much better.  Does have a brother that is in the home that is asymptomatic tested  positive that is in quarantine.  She was educated on making sure that she practices good hand hygiene and wears a mask when she is around him.  2. Essential hypertension Controlled, continue current medication regime.  She is encouraged to make sure that she starts moving around and walking as she can.  3. Prediabetes Last hemoglobin A1c was in July of this year 6.1% well controlled by diet.  Advised to continue to make sure that she is eating healthy to prevent this from going up.  4. Obesity (BMI 30-39.9) Encouraged to make sure that she is back on track with eating well and trying to get some form of exercise and 30 minutes to 60 minutes of walking is encouraged.  As well as maintaining a heart healthy, low sugar low carbohydrate low-fat diet.  5. GAD (generalized anxiety disorder) Does have anxiety but overall she reports that she is doing okay and much better since she is been at home.  Most concern is right now for her brother.  6. Pneumonia due to COVID-19 virus Improved, discharged from hospital a week ago.  Overall feels that she is doing much better.  Does not require any oxygen.  Does not have any new signs and symptoms of repeat occurrence.  Will be checking x-ray this upcoming Monday.   Time:   Today, I have spent 15 minutes with the patient with telehealth technology discussing the above problems.     Medication Adjustments/Labs and Tests Ordered: Current medicines are reviewed at length with the patient today.  Concerns regarding medicines are outlined above.   Tests Ordered: No orders of the defined types were placed in this encounter.   Medication Changes: No orders of the defined types were placed in this encounter.   Disposition:  Follow up 12/07/2019   Signed, Perlie Mayo, NP  09/10/2019 8:38 AM     [facility]

## 2019-09-10 NOTE — Patient Instructions (Signed)
    I appreciate the opportunity to provide you with the care for your health and wellness. Today we discussed: Recent hospital stay  Follow up: 12/07/2019 as scheduled, do not hesitate to reach out if you need [place] before then.  Labs placed today please get these on Monday.  These are to help make sure that your signs of infection have not returned.  X-ray ordered today please get this at [facility] on Monday this is to make sure that your lungs are doing okay after having Covid.  Your next appointment with [place] is not until January so we hope that you have a safe happy wonderful holiday season.  We hope that you can stay healthy throughout the winter and we look forward to seeing you in the new year.   Please continue to practice social distancing to keep you, your family, and our community safe.  If you must go out, please wear a mask and practice good handwashing.  It was a pleasure to see you and I look forward to continuing to work together on your health and well-being. Please do not hesitate to call the office if you need care or have questions about your care.  Have a wonderful day and week. With Gratitude, Cherly Beach, DNP, AGNP-BC

## 2019-09-13 ENCOUNTER — Other Ambulatory Visit: Payer: Self-pay

## 2019-09-13 ENCOUNTER — Ambulatory Visit (HOSPITAL_COMMUNITY)
Admission: RE | Admit: 2019-09-13 | Discharge: 2019-09-13 | Disposition: A | Discharge status: Home / Self Care | Payer: Medicare PPO | Source: Ambulatory Visit | Attending: Family Medicine | Admitting: Family Medicine

## 2019-09-13 ENCOUNTER — Encounter (HOSPITAL_COMMUNITY): Payer: Self-pay

## 2019-09-13 DIAGNOSIS — Z7689 Persons encountering health services in other specified circumstances: Secondary | ICD-10-CM | POA: Diagnosis not present

## 2019-09-13 DIAGNOSIS — U071 COVID-19: Secondary | ICD-10-CM | POA: Diagnosis not present

## 2019-09-13 DIAGNOSIS — J984 Other disorders of lung: Secondary | ICD-10-CM | POA: Diagnosis not present

## 2019-09-13 DIAGNOSIS — J1289 Other viral pneumonia: Secondary | ICD-10-CM | POA: Insufficient documentation

## 2019-09-14 LAB — CBC
HCT: 35.4 % (ref 35.0–45.0)
Hemoglobin: 11.1 g/dL — ABNORMAL LOW (ref 11.7–15.5)
MCH: 25.5 pg — ABNORMAL LOW (ref 27.0–33.0)
MCHC: 31.4 g/dL — ABNORMAL LOW (ref 32.0–36.0)
MCV: 81.4 fL (ref 80.0–100.0)
MPV: 12.6 fL — ABNORMAL HIGH (ref 7.5–12.5)
Platelets: 254 10*3/uL (ref 140–400)
RBC: 4.35 10*6/uL (ref 3.80–5.10)
RDW: 14.5 % (ref 11.0–15.0)
WBC: 5.9 10*3/uL (ref 3.8–10.8)

## 2019-09-14 LAB — BASIC METABOLIC PANEL WITH GFR
BUN: 16 mg/dL (ref 7–25)
CO2: 30 mmol/L (ref 20–32)
Calcium: 9.5 mg/dL (ref 8.6–10.4)
Chloride: 104 mmol/L (ref 98–110)
Creat: 0.83 mg/dL (ref 0.50–0.99)
GFR, Est African American: 83 mL/min/{1.73_m2} (ref >=60)
GFR, Est Non African American: 72 mL/min/{1.73_m2} (ref >=60)
Glucose, Bld: 92 mg/dL (ref 65–139)
Potassium: 4.3 mmol/L (ref 3.5–5.3)
Sodium: 143 mmol/L (ref 135–146)

## 2019-09-14 NOTE — Progress Notes (Signed)
Please call Sophia [name] with her labs and chest x-ray.  She appears to have some mild leftover scarring secondary to Covid.  But all other parts of her lungs look good.   The left liver scarring is not unusual to be seen in some individuals who get Covid.  Please have her contact us if she has any breathing difficulties.

## 2019-09-14 NOTE — Progress Notes (Signed)
Please let Sophia [name] know that her kidney function looks good. Her hemoglobin levels are little bit low.  Would like to see if she can get extra iron in her diet.  She can get this for foods such as greens and beans.   Or she can get an extra supplement to take over-the-counter.  She does take a multivitamin but this probably does not have enough iron in it to help boost her blood counts. Please let her know when her next visit is.  Thank you

## 2019-09-20 DIAGNOSIS — G4733 Obstructive sleep apnea (adult) (pediatric): Secondary | ICD-10-CM | POA: Diagnosis not present

## 2019-09-25 DIAGNOSIS — G4733 Obstructive sleep apnea (adult) (pediatric): Secondary | ICD-10-CM | POA: Diagnosis not present

## 2019-10-21 ENCOUNTER — Other Ambulatory Visit: Payer: Self-pay | Admitting: Family Medicine

## 2019-10-25 DIAGNOSIS — G4733 Obstructive sleep apnea (adult) (pediatric): Secondary | ICD-10-CM | POA: Diagnosis not present

## 2019-11-01 ENCOUNTER — Telehealth: Payer: Self-pay | Admitting: *Deleted

## 2019-11-01 ENCOUNTER — Other Ambulatory Visit: Payer: Self-pay

## 2019-11-01 MED ORDER — GABAPENTIN 100 MG PO CAPS: ORAL_CAPSULE | ORAL | 0 refills | Status: DC

## 2019-11-01 MED ORDER — BENAZEPRIL-HYDROCHLOROTHIAZIDE 20-12.5 MG PO TABS: 2.0000 | ORAL_TABLET | Freq: Every day | ORAL | 1 refills | Status: DC

## 2019-11-01 NOTE — Telephone Encounter (Signed)
Sophia [name] with [facility] pharmacy called on behalf of the patient they have sent the pts benazepril and gabapentin through the mail order but there is a delay and she wanted to know if the pt could get a 2 weeks supply sent to [facility]. She stated if there are any questions we could call the pt or reach her by [phone]

## 2019-11-01 NOTE — Telephone Encounter (Signed)
Bridge supply sent to local pharmacy

## 2019-11-25 DIAGNOSIS — G4733 Obstructive sleep apnea (adult) (pediatric): Secondary | ICD-10-CM | POA: Diagnosis not present

## 2019-11-29 NOTE — Progress Notes (Signed)
Spoke to [place] on the phone on 11/29/19 about scheduling a counseling appointment via phone to discuss stress related to caring for a family member in treatment for cancer. Pt agreed to schedule an appointment later this week and requested I call her on her home phone.  Intake Counseling Appt: Thursday, December 02, 2019 at 10:00am via phone  Sophia [name] [facility] Counseling Intern Voicemail:  [phone]

## 2019-12-07 ENCOUNTER — Encounter: Payer: Self-pay | Admitting: Family Medicine

## 2019-12-07 ENCOUNTER — Other Ambulatory Visit: Payer: Self-pay

## 2019-12-07 ENCOUNTER — Ambulatory Visit (INDEPENDENT_AMBULATORY_CARE_PROVIDER_SITE_OTHER): Payer: Medicare PPO | Admitting: Family Medicine

## 2019-12-07 VITALS — BP 118/78 | HR 77 | Temp 98.2°F | Resp 16 | Ht 69.0 in | Wt 201.4 lb

## 2019-12-07 DIAGNOSIS — E785 Hyperlipidemia, unspecified: Secondary | ICD-10-CM

## 2019-12-07 DIAGNOSIS — F410 Panic disorder [episodic paroxysmal anxiety] without agoraphobia: Secondary | ICD-10-CM | POA: Diagnosis not present

## 2019-12-07 DIAGNOSIS — I10 Essential (primary) hypertension: Secondary | ICD-10-CM

## 2019-12-07 DIAGNOSIS — F324 Major depressive disorder, single episode, in partial remission: Secondary | ICD-10-CM

## 2019-12-07 DIAGNOSIS — R7303 Prediabetes: Secondary | ICD-10-CM | POA: Diagnosis not present

## 2019-12-07 DIAGNOSIS — R7301 Impaired fasting glucose: Secondary | ICD-10-CM | POA: Diagnosis not present

## 2019-12-07 DIAGNOSIS — F411 Generalized anxiety disorder: Secondary | ICD-10-CM

## 2019-12-07 MED ORDER — BUSPIRONE HCL 7.5 MG PO TABS: 7.5000 mg | ORAL_TABLET | Freq: Three times a day (TID) | ORAL | 3 refills | Status: DC

## 2019-12-07 MED ORDER — ALPRAZOLAM 2 MG PO TABS: ORAL_TABLET | ORAL | 0 refills | Status: DC

## 2019-12-07 NOTE — Progress Notes (Signed)
Sophia [name]     MRN: [id number]      DOB: [birth year]   HPI Sophia [name] is here for follow up and re-evaluation of chronic medical conditions, medication management and review of any available recent lab and radiology data.  Preventive health is updated, specifically  Cancer screening and Immunization.   Questions or concerns regarding consultations or procedures which the PT has had in the interim are  addressed. The PT denies any adverse reactions to current medications since the last visit.  There are no new concerns.  There are no specific complaints   ROS Denies recent fever or chills. Denies sinus pressure, nasal congestion, ear pain or sore throat. Denies chest congestion, productive cough or wheezing. Denies chest pains, palpitations and leg swelling Denies abdominal pain, nausea, vomiting,diarrhea or constipation.   Denies dysuria, frequency, hesitancy or incontinence. Denies joint pain, swelling and limitation in mobility. Denies headaches, seizures, numbness, or tingling. Denies depression, anxiety or insomnia. Denies skin break down or rash.   PE  BP 118/78   Pulse 77   Temp 98.2 F (36.8 C) (Temporal)   Resp 16   Ht 5\' 9"  (1.753 m)   Wt 201 lb 6.4 oz (91.4 kg)   SpO2 95%   BMI 29.74 kg/m   Patient alert and oriented and in no cardiopulmonary distress.  HEENT: No facial asymmetry, EOMI,     Neck supple .  Chest: Clear to auscultation bilaterally.  CVS: S1, S2 no murmurs, no S3.Regular rate.  ABD: Soft non tender.   Ext: No edema  MS: Adequate ROM spine, shoulders, hips and knees.  Skin: Intact, no ulcerations or rash noted.  Psych: Good eye contact, normal affect. Memory intact not anxious or depressed appearing.  CNS: CN 2-12 intact, power,  normal throughout.no focal deficits noted.   Assessment & Plan  Depression, major, single episode, complete remission ([place]) Major stressor is illness in family  Panic attack Increased with family  illness, dose increase in buspar and xanax and refer to therapist   GAD (generalized anxiety disorder) Uncontrolled and increased increase buspar  Essential hypertension Controlled, no change in medication DASH diet and commitment to daily physical activity for a minimum of 30 minutes discussed and encouraged, as a part of hypertension management. The importance of attaining a healthy weight is also discussed.  BP/Weight 12/07/2019 09/03/2019 08/29/2019 07/14/2019 06/01/2019 05/01/2019 [id number]  Systolic BP 123456 0000000 - 123XX123 123456 [id number]  Diastolic BP 78 70 - 80 80 65 74  Wt. (Lbs) 201.4 - 190 212 197 212.3 205  BMI 29.74 - 28.06 31.31 29.09 31.35 30.27       Prediabetes Patient educated about the importance of limiting  Carbohydrate intake , the need to commit to daily physical activity for a minimum of 30 minutes , and to commit weight loss. The fact that changes in all these areas will reduce or eliminate all together the development of diabetes is stressed.  Updated lab needed at/ before next visit.   Diabetic Labs Latest Ref Rng & Units 09/13/2019 09/03/2019 09/02/2019 09/01/2019 08/31/2019  HbA1c <5.7 % of total Hgb - - - - -  Microalbumin Not estab mg/dL - - - - -  Micro/Creat Ratio <30 mcg/mg creat - - - - -  Chol <200 mg/dL - - - - -  HDL > OR = 50 mg/dL - - - - -  Calc LDL mg/dL (calc) - - - - -  Triglycerides <150 mg/dL - - - - -  Creatinine 0.50 - 0.99 mg/dL 0.83 0.64 0.75 0.73 0.69   BP/Weight 12/07/2019 09/03/2019 08/29/2019 07/14/2019 06/01/2019 05/01/2019 [id number]  Systolic BP 123456 0000000 - [id number] 123456 [id number]  Diastolic BP 78 70 - 80 80 65 74  Wt. (Lbs) 201.4 - 190 212 197 212.3 205  BMI 29.74 - 28.06 31.31 29.09 31.35 30.27   Foot/eye exam completion dates Latest Ref Rng & Units 01/21/2019 05/20/2018  Eye Exam No Retinopathy No Retinopathy -  Foot Form Completion - - Done

## 2019-12-07 NOTE — Patient Instructions (Addendum)
F/U in office with mD in 8 weeks, call if you need me sooner  You are  Referred to therapist  [place] TO dR fIELDS RE UNCONTROLLED REFLUX WITH DIFFICULTY SWALLOWING AND ALSO FOR YOUR COLONOSCIOPY  PLEASE GET FASTING LIPID, CMP AND EgfR, Hba1C AND CBC IN THE 2ND WEEK IN February  Thanks for choosing [facility], we consider it a privelige to serve you.

## 2019-12-12 ENCOUNTER — Encounter: Payer: Self-pay | Admitting: Family Medicine

## 2019-12-12 DIAGNOSIS — F325 Major depressive disorder, single episode, in full remission: Secondary | ICD-10-CM | POA: Insufficient documentation

## 2019-12-12 DIAGNOSIS — F324 Major depressive disorder, single episode, in partial remission: Secondary | ICD-10-CM | POA: Insufficient documentation

## 2019-12-12 NOTE — Assessment & Plan Note (Signed)
Major stressor is illness in family

## 2019-12-12 NOTE — Assessment & Plan Note (Signed)
Controlled, no change in medication DASH diet and commitment to daily physical activity for a minimum of 30 minutes discussed and encouraged, as a part of hypertension management. The importance of attaining a healthy weight is also discussed.  BP/Weight 12/07/2019 09/03/2019 08/29/2019 07/14/2019 06/01/2019 05/01/2019 [id number]  Systolic BP 123456 0000000 - [id number] 123456 [id number]  Diastolic BP 78 70 - 80 80 65 74  Wt. (Lbs) 201.4 - 190 212 197 212.3 205  BMI 29.74 - 28.06 31.31 29.09 31.35 30.27

## 2019-12-12 NOTE — Assessment & Plan Note (Signed)
Increased with family illness, dose increase in buspar and xanax and refer to therapist

## 2019-12-12 NOTE — Assessment & Plan Note (Signed)
Patient educated about the importance of limiting  Carbohydrate intake , the need to commit to daily physical activity for a minimum of 30 minutes , and to commit weight loss. The fact that changes in all these areas will reduce or eliminate all together the development of diabetes is stressed.  Updated lab needed at/ before next visit.   Diabetic Labs Latest Ref Rng & Units 09/13/2019 09/03/2019 09/02/2019 09/01/2019 08/31/2019  HbA1c <5.7 % of total Hgb - - - - -  Microalbumin Not estab mg/dL - - - - -  Micro/Creat Ratio <30 mcg/mg creat - - - - -  Chol <200 mg/dL - - - - -  HDL > OR = 50 mg/dL - - - - -  Calc LDL mg/dL (calc) - - - - -  Triglycerides <150 mg/dL - - - - -  Creatinine 0.50 - 0.99 mg/dL 0.83 0.64 0.75 0.73 0.69   BP/Weight 12/07/2019 09/03/2019 08/29/2019 07/14/2019 06/01/2019 05/01/2019 [id number]  Systolic BP 123456 0000000 - [id number] 123456 [id number]  Diastolic BP 78 70 - 80 80 65 74  Wt. (Lbs) 201.4 - 190 212 197 212.3 205  BMI 29.74 - 28.06 31.31 29.09 31.35 30.27   Foot/eye exam completion dates Latest Ref Rng & Units 01/21/2019 05/20/2018  Eye Exam No Retinopathy No Retinopathy -  Foot Form Completion - - Done

## 2019-12-12 NOTE — Assessment & Plan Note (Signed)
Uncontrolled and increased increase buspar

## 2019-12-23 NOTE — Progress Notes (Signed)
Spoke to patient om 12/23/19 for a scheduled telehealth counseling session via phone. Counseling Intern provided space for pt to discuss her challenges and provided empathy, compassion and normalization of feelings. Pt reported that taking a half Xanax tablet once weekly has helped her feel better and emotionally stable. She explained that she is now able to handle the stress related to her brother's care and the distress related to witnessing his suffering. Pt and CI discussed difficulty of watching a loved one suffer. Pt also reported accepting the possibility of her brother's impending death, saying she has given her worries to God and believes that he knows best. However, pt stated she is waiting until her brother's next appointment to focus on next steps, knowing on Monday they will get the information needed to determine if more tx is necessary or if hospice will be called in. Pt stated she plans to keep her brother at home in either case, but will arrange hospice support in the home. Pt and CI discussed importance of eating regular meals for increased emotional stability when pt reported sometimes forgetting to eat lunch. Pt also stated she plans to bring her Dad home from the nursing home once "the situation with her brother is figured out". Pt stated she still finds joy in her life despite all the stress and believes she is blessed. Pt Pt repeated mantra: "I know we will be fine" many times during this session. Pt stated counseling continues to be a helpful space for her to talk about and think about her life and challenges.    Next Counseling Appt: Thursday, December 30, 2019 at noon via phone  [place] Counseling Intern Voicemail:  [phone]

## 2019-12-26 DIAGNOSIS — G4733 Obstructive sleep apnea (adult) (pediatric): Secondary | ICD-10-CM | POA: Diagnosis not present

## 2020-01-07 NOTE — Progress Notes (Signed)
Counseling intern spoke to patient on 01/07/20 for a scheduled counseling session via phone. CI provided therapeutic listening, empathy, and normalization of feelings. Pt reported she is doing "much better" and explained that she now is able to accept the challenges in her life without resisting. Pt and CI discussed the power in radical acceptance and how giving up the resistance to a situation can give you the strength to cope and enhanced feelings of well being. Pt stated "God doesn't make mistakes" and she now accepts what life brings and understands that she is doing the best she can. Pt stated she used to believe that she was "never doing enough" but her new perspective allows her to be at peace with her efforts to help her brother and her father. Pt stated she feels that she no longer needs regularly scheduled counseling sessions. CI proposed that we will "check-in" in 3 weeks and pt agreed to this plan.   Sophia [name] [place] Counseling Intern Voicemail:  [phone]

## 2020-01-24 DIAGNOSIS — E119 Type 2 diabetes mellitus without complications: Secondary | ICD-10-CM | POA: Diagnosis not present

## 2020-01-24 DIAGNOSIS — Z961 Presence of intraocular lens: Secondary | ICD-10-CM | POA: Diagnosis not present

## 2020-01-24 LAB — HM DIABETES EYE EXAM

## 2020-01-28 DIAGNOSIS — R7301 Impaired fasting glucose: Secondary | ICD-10-CM | POA: Diagnosis not present

## 2020-01-28 DIAGNOSIS — I10 Essential (primary) hypertension: Secondary | ICD-10-CM | POA: Diagnosis not present

## 2020-01-28 DIAGNOSIS — E785 Hyperlipidemia, unspecified: Secondary | ICD-10-CM | POA: Diagnosis not present

## 2020-01-29 LAB — COMPLETE METABOLIC PANEL WITH GFR
AG Ratio: 1.6 (calc) (ref 1.0–2.5)
ALT: 17 U/L (ref 6–29)
AST: 16 U/L (ref 10–35)
Albumin: 4.4 g/dL (ref 3.6–5.1)
Alkaline phosphatase (APISO): 66 U/L (ref 37–153)
BUN: 13 mg/dL (ref 7–25)
CO2: 33 mmol/L — ABNORMAL HIGH (ref 20–32)
Calcium: 9.9 mg/dL (ref 8.6–10.4)
Chloride: 101 mmol/L (ref 98–110)
Creat: 0.83 mg/dL (ref 0.50–0.99)
GFR, Est African American: 83 mL/min/{1.73_m2} (ref >=60)
GFR, Est Non African American: 72 mL/min/{1.73_m2} (ref >=60)
Globulin: 2.8 g/dL (calc) (ref 1.9–3.7)
Glucose, Bld: 91 mg/dL (ref 65–99)
Potassium: 4.3 mmol/L (ref 3.5–5.3)
Sodium: 143 mmol/L (ref 135–146)
Total Bilirubin: 0.4 mg/dL (ref 0.2–1.2)
Total Protein: 7.2 g/dL (ref 6.1–8.1)

## 2020-01-29 LAB — CBC
HCT: 39.4 % (ref 35.0–45.0)
Hemoglobin: 12.4 g/dL (ref 11.7–15.5)
MCH: 25.8 pg — ABNORMAL LOW (ref 27.0–33.0)
MCHC: 31.5 g/dL — ABNORMAL LOW (ref 32.0–36.0)
MCV: 82.1 fL (ref 80.0–100.0)
MPV: 11.1 fL (ref 7.5–12.5)
Platelets: 209 10*3/uL (ref 140–400)
RBC: 4.8 10*6/uL (ref 3.80–5.10)
RDW: 15.5 % — ABNORMAL HIGH (ref 11.0–15.0)
WBC: 5 10*3/uL (ref 3.8–10.8)

## 2020-01-29 LAB — HEMOGLOBIN A1C
Hgb A1c MFr Bld: 6.2 % of total Hgb — ABNORMAL HIGH (ref <5.7)
Mean Plasma Glucose: 131 (calc)
eAG (mmol/L): 7.3 (calc)

## 2020-01-29 LAB — LIPID PANEL
Cholesterol: 190 mg/dL (ref <200)
HDL: 61 mg/dL (ref >=50)
LDL Cholesterol (Calc): 107 mg/dL (calc) — ABNORMAL HIGH
Non-HDL Cholesterol (Calc): 129 mg/dL (calc) (ref <130)
Total CHOL/HDL Ratio: 3.1 (calc) (ref <5.0)
Triglycerides: 119 mg/dL (ref <150)

## 2020-02-03 ENCOUNTER — Ambulatory Visit: Payer: Medicare PPO | Admitting: Family Medicine

## 2020-02-09 ENCOUNTER — Ambulatory Visit: Payer: Medicare PPO | Admitting: Family Medicine

## 2020-02-09 ENCOUNTER — Other Ambulatory Visit: Payer: Self-pay | Admitting: Family Medicine

## 2020-02-10 NOTE — Telephone Encounter (Signed)
Pt requesting refill for xanax last appointment 12-07-19

## 2020-02-11 DIAGNOSIS — G4733 Obstructive sleep apnea (adult) (pediatric): Secondary | ICD-10-CM | POA: Diagnosis not present

## 2020-02-18 ENCOUNTER — Other Ambulatory Visit: Payer: Self-pay | Admitting: Family Medicine

## 2020-02-18 ENCOUNTER — Telehealth: Payer: Self-pay | Admitting: Family Medicine

## 2020-02-18 MED ORDER — ALPRAZOLAM 2 MG PO TABS: ORAL_TABLET | ORAL | 3 refills | Status: DC

## 2020-02-18 NOTE — Telephone Encounter (Signed)
pls let herkow that prescription sent in to be filled on 04/07 for alprazolam 2 mg tablets as requested, this also has refills for the next several months

## 2020-02-18 NOTE — Telephone Encounter (Signed)
Pt notified this was sent to pharmacy to be picked up April 7 with verbal understanding

## 2020-04-21 ENCOUNTER — Other Ambulatory Visit: Payer: Self-pay | Admitting: Family Medicine

## 2020-05-30 ENCOUNTER — Other Ambulatory Visit (HOSPITAL_COMMUNITY): Payer: Self-pay | Admitting: Family Medicine

## 2020-05-30 DIAGNOSIS — Z1231 Encounter for screening mammogram for malignant neoplasm of breast: Secondary | ICD-10-CM

## 2020-06-08 ENCOUNTER — Other Ambulatory Visit (HOSPITAL_COMMUNITY)
Admission: AD | Admit: 2020-06-08 | Discharge: 2020-06-08 | Disposition: A | Discharge status: Home / Self Care | Payer: Medicare PPO | Source: Skilled Nursing Facility | Attending: Family Medicine | Admitting: Family Medicine

## 2020-06-08 ENCOUNTER — Ambulatory Visit (INDEPENDENT_AMBULATORY_CARE_PROVIDER_SITE_OTHER): Payer: Medicare PPO | Admitting: Family Medicine

## 2020-06-08 ENCOUNTER — Other Ambulatory Visit: Payer: Self-pay

## 2020-06-08 ENCOUNTER — Encounter: Payer: Self-pay | Admitting: Family Medicine

## 2020-06-08 VITALS — BP 159/72 | HR 69 | Resp 16 | Ht 69.0 in | Wt 197.0 lb

## 2020-06-08 DIAGNOSIS — Z720 Tobacco use: Secondary | ICD-10-CM

## 2020-06-08 DIAGNOSIS — K219 Gastro-esophageal reflux disease without esophagitis: Secondary | ICD-10-CM | POA: Diagnosis not present

## 2020-06-08 DIAGNOSIS — F418 Other specified anxiety disorders: Secondary | ICD-10-CM

## 2020-06-08 DIAGNOSIS — R7303 Prediabetes: Secondary | ICD-10-CM | POA: Insufficient documentation

## 2020-06-08 DIAGNOSIS — D126 Benign neoplasm of colon, unspecified: Secondary | ICD-10-CM

## 2020-06-08 DIAGNOSIS — E559 Vitamin D deficiency, unspecified: Secondary | ICD-10-CM

## 2020-06-08 DIAGNOSIS — E785 Hyperlipidemia, unspecified: Secondary | ICD-10-CM | POA: Diagnosis not present

## 2020-06-08 DIAGNOSIS — F17200 Nicotine dependence, unspecified, uncomplicated: Secondary | ICD-10-CM

## 2020-06-08 DIAGNOSIS — I10 Essential (primary) hypertension: Secondary | ICD-10-CM | POA: Diagnosis not present

## 2020-06-08 LAB — POCT GLYCOSYLATED HEMOGLOBIN (HGB A1C)
HbA1c POC (<> result, manual entry): 6 % (ref 4.0–5.6)
HbA1c, POC (controlled diabetic range): 6 % (ref 0.0–7.0)
HbA1c, POC (prediabetic range): 6 % (ref 5.7–6.4)
Hemoglobin A1C: 6 % — AB (ref 4.0–5.6)

## 2020-06-08 IMAGING — CR DG CHEST 1V PORT
1 series · 1 of 1 positions shown · non-contrast
Comparison: 06/22/2018

CLINICAL DATA: Body aches

EXAM:
PORTABLE CHEST 1 VIEW

[portable]
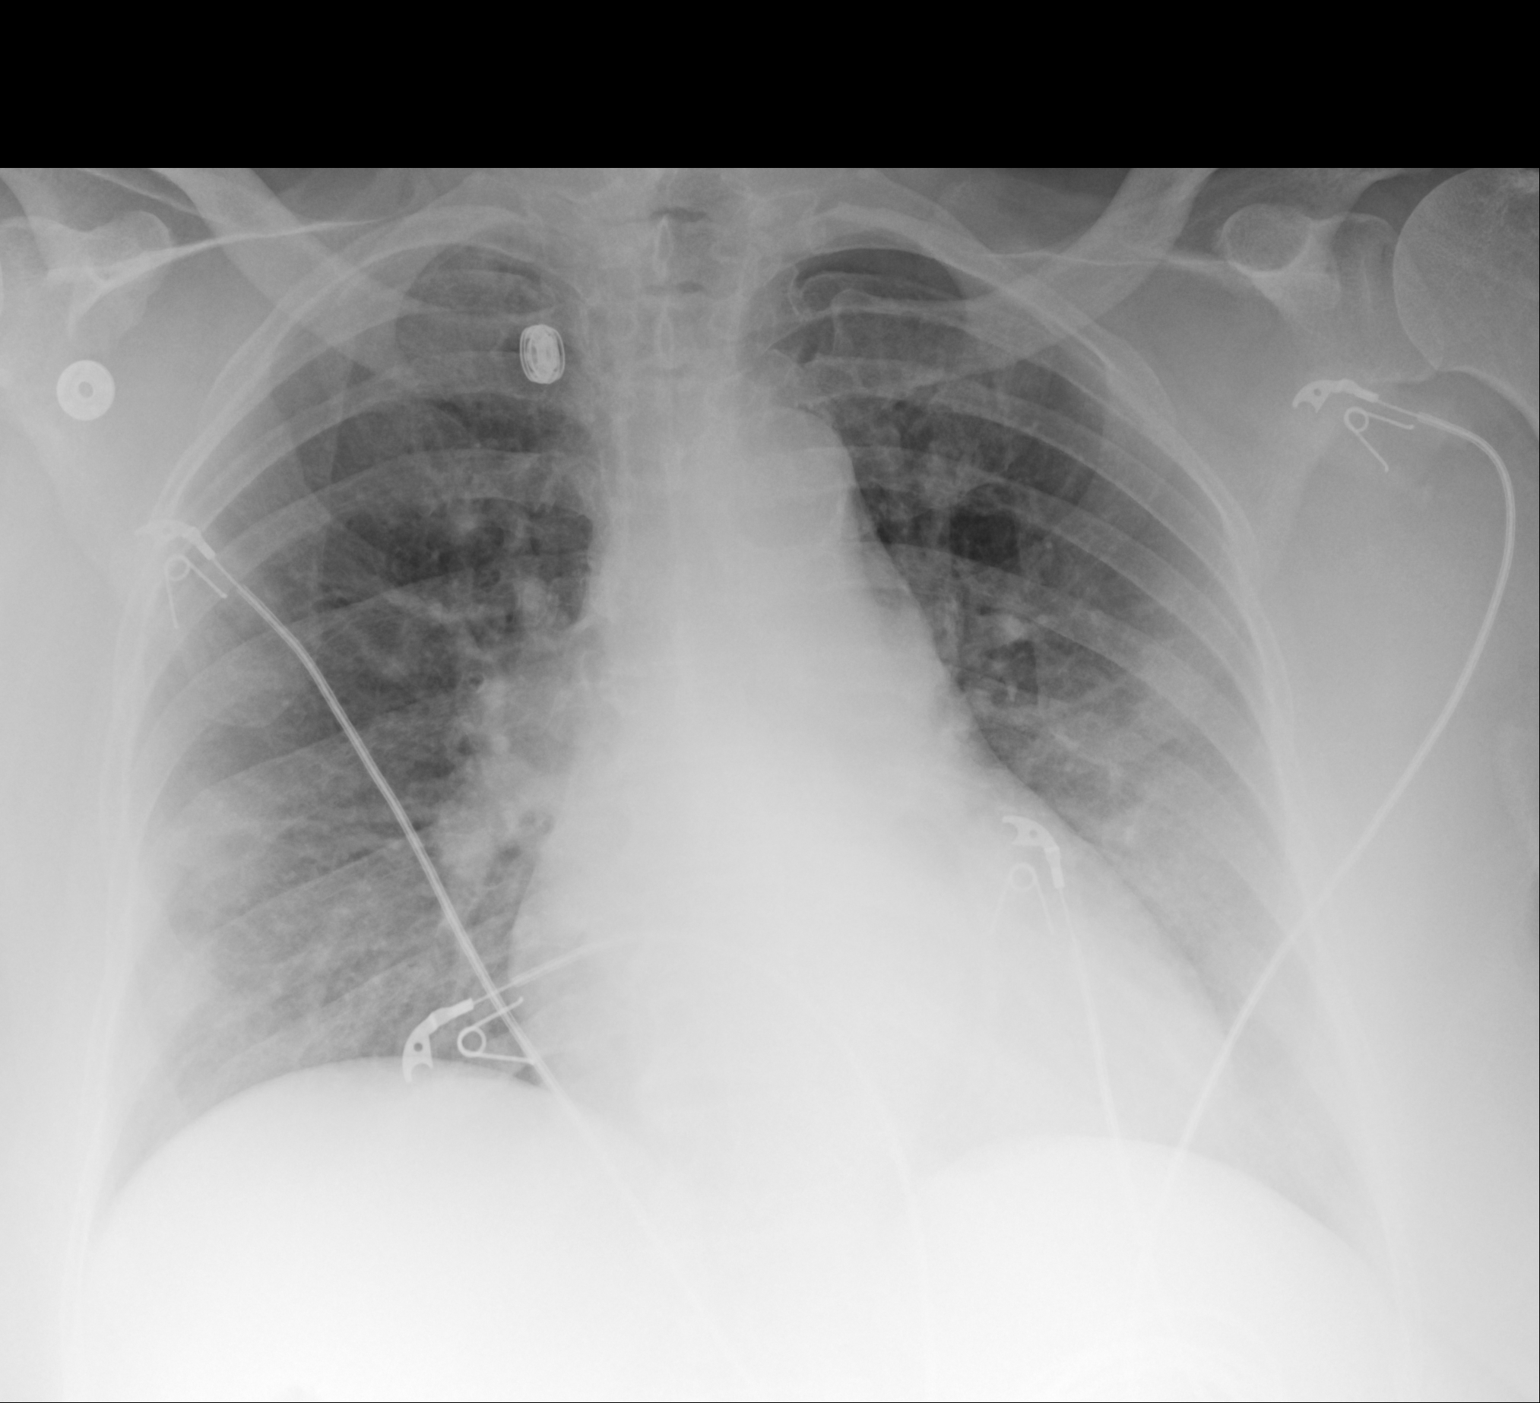

[1 of 1 positions shown; findings below may reference images not displayed]

FINDINGS: Increased opacity at the right lung base. Cardiomediastinal contours
are normal. No pneumothorax or sizable pleural effusion.
IMPRESSION: Increased opacity at the right lung base may indicate developing
consolidation.

## 2020-06-08 MED ORDER — BENAZEPRIL-HYDROCHLOROTHIAZIDE 20-12.5 MG PO TABS: 2.0000 | ORAL_TABLET | Freq: Every day | ORAL | 1 refills | Status: DC

## 2020-06-08 MED ORDER — ALPRAZOLAM 2 MG PO TABS: ORAL_TABLET | ORAL | 5 refills | Status: DC

## 2020-06-08 MED ORDER — BENAZEPRIL-HYDROCHLOROTHIAZIDE 20-12.5 MG PO TABS: 2.0000 | ORAL_TABLET | Freq: Every day | ORAL | 0 refills | Status: DC

## 2020-06-08 NOTE — Progress Notes (Signed)
Sophia [name]     MRN: [id number]      DOB: [birth year]   HPI [name] is here for follow up and re-evaluation of chronic medical conditions, medication management and review of any available recent lab and radiology data.  Preventive health is updated, specifically  Cancer screening and Immunization.   Questions or concerns regarding consultations or procedures which the PT has had in the interim are  addressed. The PT denies any adverse reactions to current medications since the last visit.  There are no new concerns.  There are no specific complaints   ROS Denies recent fever or chills. Denies sinus pressure, nasal congestion, ear pain or sore throat. Denies chest congestion, productive cough or wheezing. Denies chest pains, palpitations and leg swelling Denies abdominal pain, nausea, vomiting,diarrhea or constipation.   Denies dysuria, frequency, hesitancy or incontinence. Denies joint pain, swelling and limitation in mobility. Denies headaches, seizures, numbness, or tingling. Denies depression, anxiety or insomnia. Denies skin break down or rash.   PE  BP (!) 159/72   Pulse 69   Resp 16   Ht 5\' 9"  (1.753 m)   Wt 197 lb (89.4 kg)   SpO2 97%   BMI 29.09 kg/m   Patient alert and oriented and in no cardiopulmonary distress.  HEENT: No facial asymmetry, EOMI,     Neck supple .  Chest: Clear to auscultation bilaterally.  CVS: S1, S2 no murmurs, no S3.Regular rate.  ABD: Soft non tender.   Ext: No edema  MS: Adequate ROM spine, shoulders, hips and knees.  Skin: Intact, no ulcerations or rash noted.  Psych: Good eye contact, normal affect. Memory intact not anxious or depressed appearing.  CNS: CN 2-12 intact, power,  normal throughout.no focal deficits noted.   Assessment & Plan  Depression with anxiety Controlled, no change in medication Improved anxiety since last visit though reports needinmg increased xanax as her brother is terminally ill and she is  caring for him, small dose increase   Essential hypertension Uncontrolled, out of one of her meds, will resume same  DASH diet and commitment to daily physical activity for a minimum of 30 minutes discussed and encouraged, as a part of hypertension management. The importance of attaining a healthy weight is also discussed.  BP/Weight 06/08/2020 12/07/2019 09/03/2019 08/29/2019 07/14/2019 06/01/2019 03/12/9562  Systolic BP 875 643 329 - 518 841 660  Diastolic BP 72 78 70 - 80 80 65  Wt. (Lbs) 197 201.4 - 190 212 197 212.3  BMI 29.09 29.74 - 28.06 31.31 29.09 31.35       GERD Controlled, no change in medication   Current nicotine use Has resumed snuff ,in modest amount, but uses when anxious  Asked:confirms currently dips snuff Assess: Unwilling to quit but cutting back Advise: needs to QUIT to reduce risk of cancer, cardio and cerebrovascular disease Assist: counseled for 5 minutes and literature provided Arrange: follow up in 3 months '  Prediabetes Improved, she is congratulated on this Patient educated about the importance of limiting  Carbohydrate intake , the need to commit to daily physical activity for a minimum of 30 minutes , and to commit weight loss. The fact that changes in all these areas will reduce or eliminate all together the development of diabetes is stressed.   Diabetic Labs Latest Ref Rng & Units 06/08/2020 06/08/2020 06/08/2020 06/08/2020 01/28/2020  HbA1c 4.0 - 5.6 % 6.0 6.0 6.0 6.0(A) 6.2(H)  Microalbumin Not Estab. ug/mL 98.9(H) - - - -  Micro/Creat Ratio 0 - 29 mg/g creat 71(H) - - - -  Chol <200 mg/dL - - - - 190  HDL > OR = 50 mg/dL - - - - 61  Calc LDL mg/dL (calc) - - - - 107(H)  Triglycerides <150 mg/dL - - - - 119  Creatinine 0.50 - 0.99 mg/dL - - - - 0.83   BP/Weight 06/08/2020 12/07/2019 09/03/2019 08/29/2019 07/14/2019 06/01/2019 3/84/5364  Systolic BP 680 321 224 - 825 003 704  Diastolic BP 72 78 70 - 80 80 65  Wt. (Lbs) 197 201.4 - 190 212 197  212.3  BMI 29.09 29.74 - 28.06 31.31 29.09 31.35   Foot/eye exam completion dates Latest Ref Rng & Units 01/24/2020 01/21/2019  Eye Exam No Retinopathy No Retinopathy No Retinopathy  Foot Form Completion - - -

## 2020-06-08 NOTE — Patient Instructions (Signed)
Annual physical exam with MD in 3 months call if you need me sooner.  Microalbumin from office today.  Glycohemoglobin in office today.  I will refer you for screening colonoscopy.  Fasting lipids CMP and EGFR TSH vitamin D to be obtained 3 to 5 days before next visit  Please stop dipping snuff  Totally,  when stressed find something else to do.  Thankful that you are doing better overall.  Dose of Xanax is adjusted as we discussed.  It is important that you exercise regularly at least 30 minutes 5 times a week. If you develop chest pain, have severe difficulty breathing, or feel very tired, stop exercising immediately and seek medical attention  Think about what you will eat, plan ahead. Choose " clean, green, fresh or frozen" over canned, processed or packaged foods which are more sugary, salty and fatty. 70 to 75% of food eaten should be vegetables and fruit. Three meals at set times with snacks allowed between meals, but they must be fruit or vegetables. Aim to eat over a 12 hour period , example 7 am to 7 pm, and STOP after  your last meal of the day. Drink water,generally about 64 ounces per day, no other drink is as healthy. Fruit juice is best enjoyed in a healthy way, by EATING the fruit.    

## 2020-06-09 LAB — MICROALBUMIN / CREATININE URINE RATIO
Creatinine, Urine: 138.6 mg/dL
Microalb Creat Ratio: 71 mg/g creat — ABNORMAL HIGH (ref 0–29)
Microalb, Ur: 98.9 ug/mL — ABNORMAL HIGH

## 2020-06-11 ENCOUNTER — Encounter: Payer: Self-pay | Admitting: Family Medicine

## 2020-06-11 NOTE — Assessment & Plan Note (Signed)
Controlled, no change in medication Improved anxiety since last visit though reports needinmg increased xanax as her brother is terminally ill and she is caring for him, small dose increase

## 2020-06-11 NOTE — Assessment & Plan Note (Signed)
Controlled, no change in medication  

## 2020-06-11 NOTE — Assessment & Plan Note (Signed)
Uncontrolled, out of one of her meds, will resume same  DASH diet and commitment to daily physical activity for a minimum of 30 minutes discussed and encouraged, as a part of hypertension management. The importance of attaining a healthy weight is also discussed.  BP/Weight 06/08/2020 12/07/2019 09/03/2019 08/29/2019 07/14/2019 06/01/2019 06/16/8568  Systolic BP 437 005 259 - 102 890 228  Diastolic BP 72 78 70 - 80 80 65  Wt. (Lbs) 197 201.4 - 190 212 197 212.3  BMI 29.09 29.74 - 28.06 31.31 29.09 31.35

## 2020-06-11 NOTE — Assessment & Plan Note (Signed)
Has resumed snuff ,in modest amount, but uses when anxious  Asked:confirms currently dips snuff Assess: Unwilling to quit but cutting back Advise: needs to QUIT to reduce risk of cancer, cardio and cerebrovascular disease Assist: counseled for 5 minutes and literature provided Arrange: follow up in 3 months '

## 2020-06-11 NOTE — Assessment & Plan Note (Signed)
Improved, she is congratulated on this Patient educated about the importance of limiting  Carbohydrate intake , the need to commit to daily physical activity for a minimum of 30 minutes , and to commit weight loss. The fact that changes in all these areas will reduce or eliminate all together the development of diabetes is stressed.   Diabetic Labs Latest Ref Rng & Units 06/08/2020 06/08/2020 06/08/2020 06/08/2020 01/28/2020  HbA1c 4.0 - 5.6 % 6.0 6.0 6.0 6.0(A) 6.2(H)  Microalbumin Not Estab. ug/mL 98.9(H) - - - -  Micro/Creat Ratio 0 - 29 mg/g creat 71(H) - - - -  Chol <200 mg/dL - - - - 190  HDL > OR = 50 mg/dL - - - - 61  Calc LDL mg/dL (calc) - - - - 107(H)  Triglycerides <150 mg/dL - - - - 119  Creatinine 0.50 - 0.99 mg/dL - - - - 0.83   BP/Weight 06/08/2020 12/07/2019 09/03/2019 08/29/2019 07/14/2019 06/01/2019 0/93/8182  Systolic BP 993 716 967 - 893 810 175  Diastolic BP 72 78 70 - 80 80 65  Wt. (Lbs) 197 201.4 - 190 212 197 212.3  BMI 29.09 29.74 - 28.06 31.31 29.09 31.35   Foot/eye exam completion dates Latest Ref Rng & Units 01/24/2020 01/21/2019  Eye Exam No Retinopathy No Retinopathy No Retinopathy  Foot Form Completion - - -

## 2020-06-12 ENCOUNTER — Other Ambulatory Visit: Payer: Self-pay

## 2020-06-12 ENCOUNTER — Encounter: Payer: Self-pay | Admitting: Internal Medicine

## 2020-06-12 MED ORDER — GABAPENTIN 100 MG PO CAPS: ORAL_CAPSULE | ORAL | 0 refills | Status: DC

## 2020-06-12 MED ORDER — BENAZEPRIL-HYDROCHLOROTHIAZIDE 20-12.5 MG PO TABS: 2.0000 | ORAL_TABLET | Freq: Every day | ORAL | 0 refills | Status: DC

## 2020-06-12 MED ORDER — AMLODIPINE BESYLATE 10 MG PO TABS: 10.0000 mg | ORAL_TABLET | Freq: Every day | ORAL | 0 refills | Status: DC

## 2020-06-12 MED ORDER — FLUOXETINE HCL 40 MG PO CAPS: 40.0000 mg | ORAL_CAPSULE | Freq: Every day | ORAL | 0 refills | Status: DC

## 2020-06-12 MED ORDER — PRAVASTATIN SODIUM 40 MG PO TABS: 40.0000 mg | ORAL_TABLET | Freq: Every evening | ORAL | 0 refills | Status: DC

## 2020-06-24 IMAGING — DX DG CHEST 2V
2 series · 2 of 2 positions shown · non-contrast
Comparison: 08/28/2019

CLINICAL DATA: History of [id number] positivity 1 month ago,
follow-up exam

EXAM:
CHEST - 2 VIEW

[chest pa]
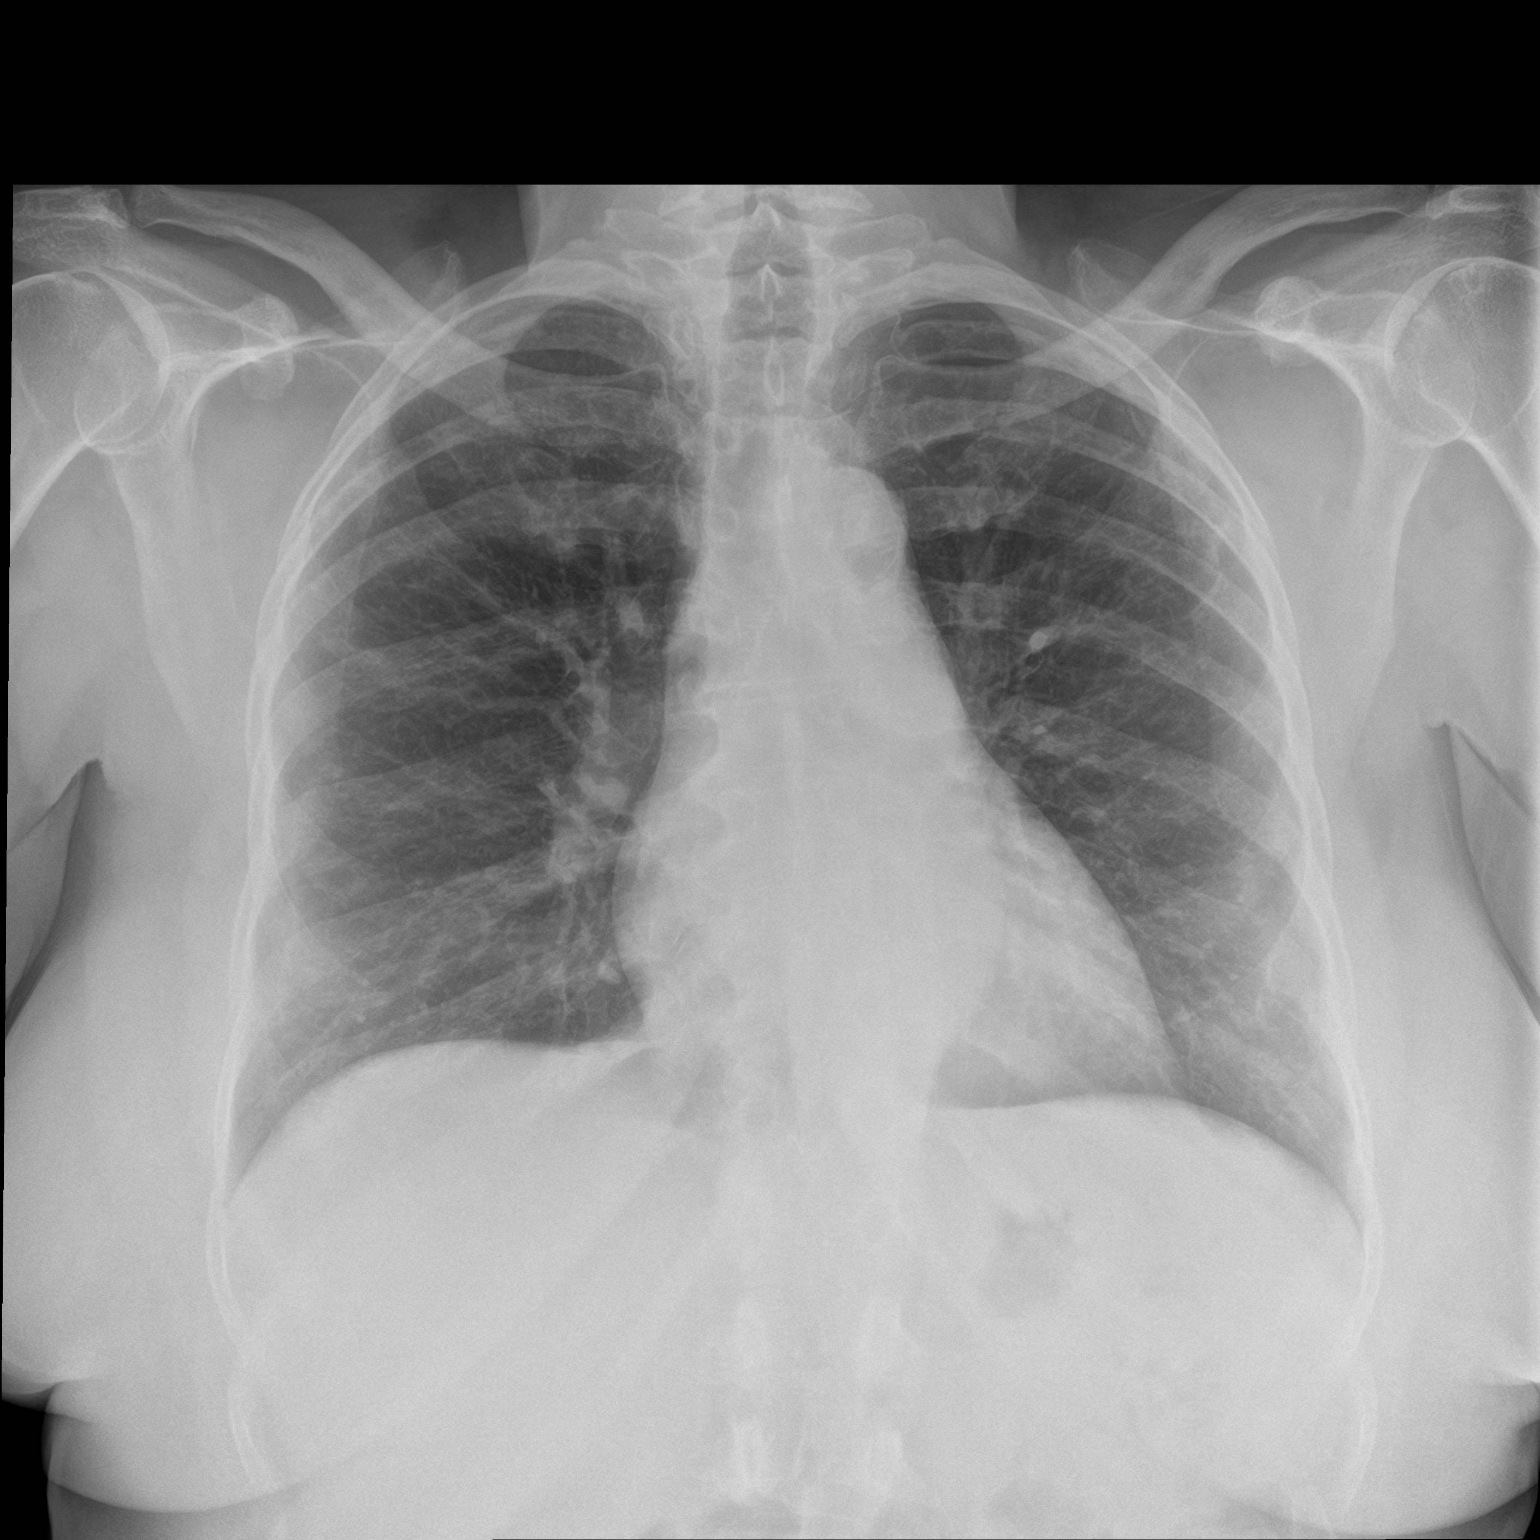

[chest lat]
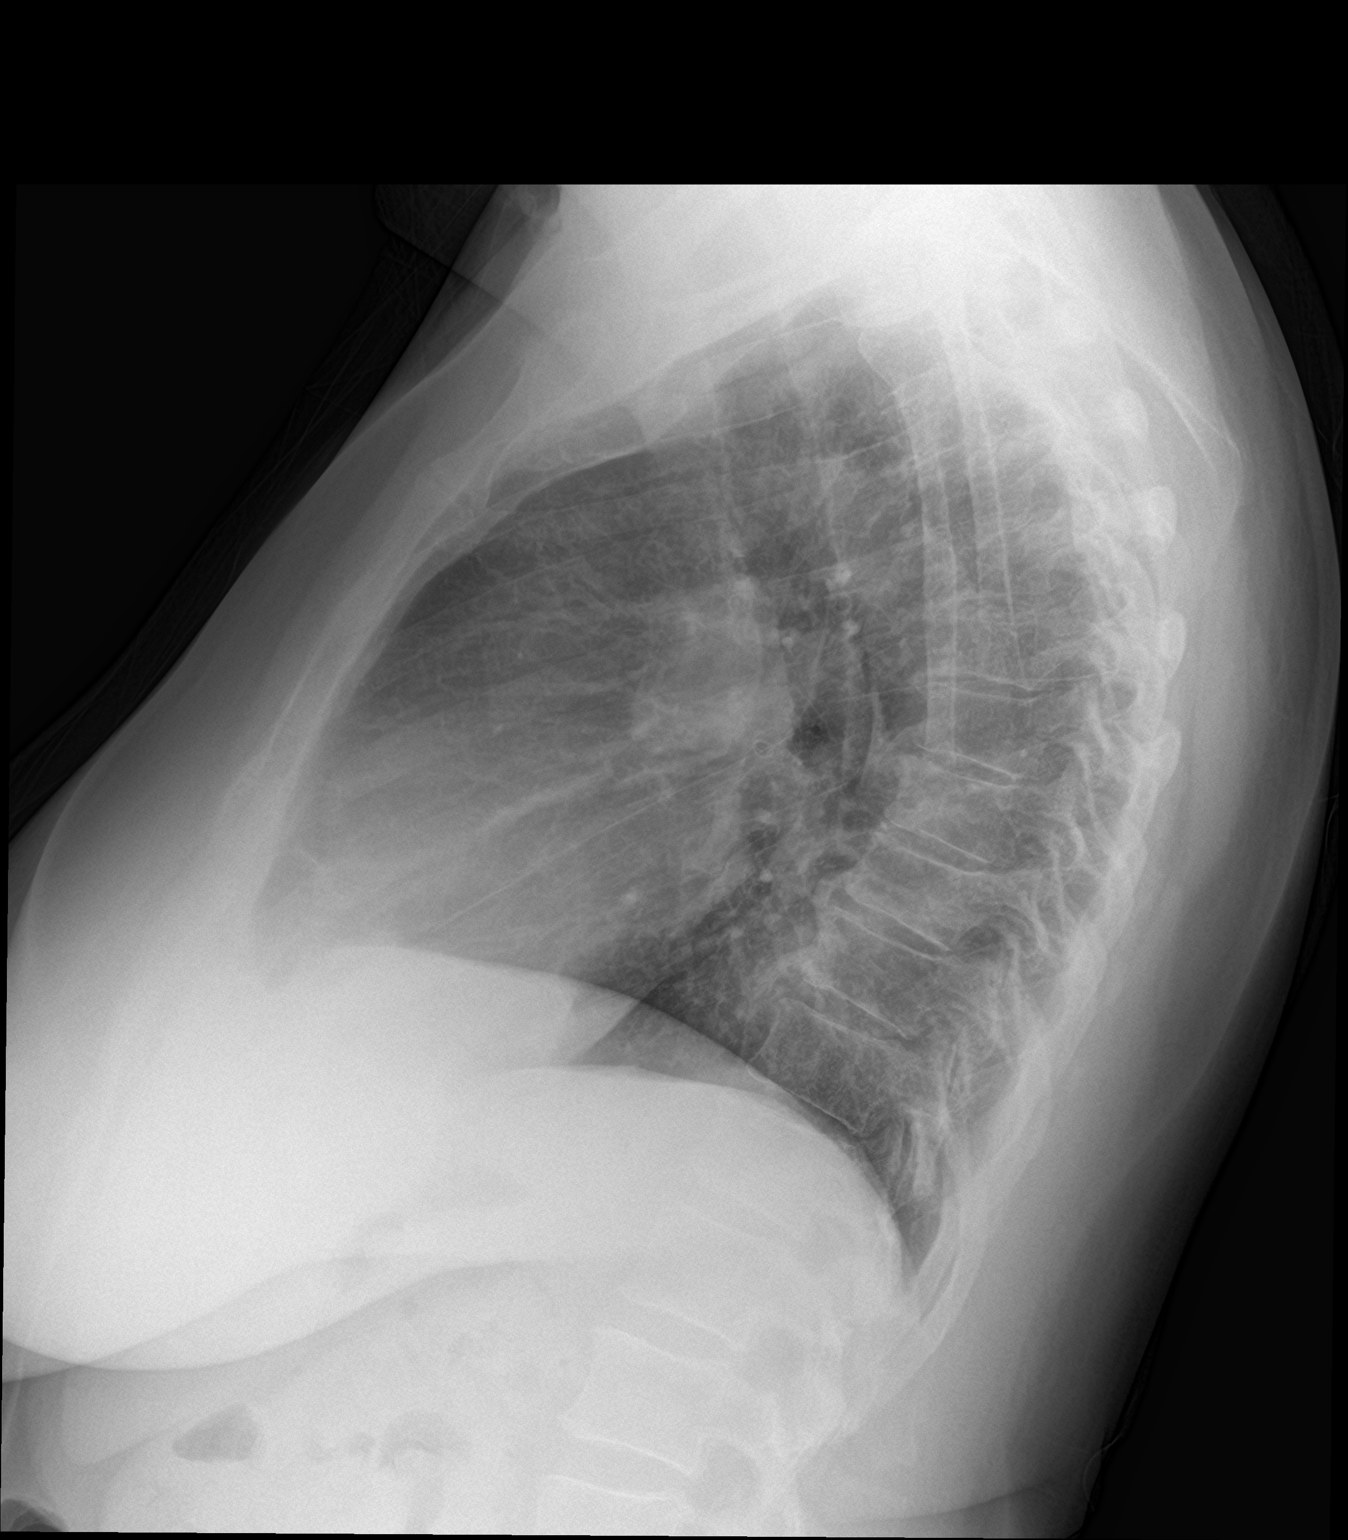

[2 of 2 positions shown; findings below may reference images not displayed]

FINDINGS: Cardiac shadow is stable. Aortic calcifications are seen. Patchy
infiltrates seen on the prior exam are no longer identified. Some
minimal residual scarring remains. No sizable effusion is noted. No
bony abnormality is seen.
IMPRESSION: Mild residual scarring particularly in the apices bilaterally. No
acute infiltrate is seen.

## 2020-07-10 ENCOUNTER — Other Ambulatory Visit: Payer: Self-pay

## 2020-07-10 ENCOUNTER — Ambulatory Visit (HOSPITAL_COMMUNITY)
Admission: RE | Admit: 2020-07-10 | Discharge: 2020-07-10 | Disposition: A | Discharge status: Home / Self Care | Payer: Medicare PPO | Source: Ambulatory Visit | Attending: Family Medicine | Admitting: Family Medicine

## 2020-07-10 DIAGNOSIS — Z1231 Encounter for screening mammogram for malignant neoplasm of breast: Secondary | ICD-10-CM | POA: Insufficient documentation

## 2020-07-17 ENCOUNTER — Ambulatory Visit (INDEPENDENT_AMBULATORY_CARE_PROVIDER_SITE_OTHER): Payer: Medicare PPO

## 2020-07-17 ENCOUNTER — Other Ambulatory Visit: Payer: Self-pay

## 2020-07-17 DIAGNOSIS — Z0000 Encounter for general adult medical examination without abnormal findings: Secondary | ICD-10-CM | POA: Diagnosis not present

## 2020-07-17 NOTE — Patient Instructions (Signed)
Sophia [name] , Thank you for taking time to come for your Medicare Wellness Visit. I appreciate your ongoing commitment to your health goals. Please review the following plan we discussed and let me know if I can assist you in the future.   Screening recommendations/referrals: Colonoscopy: scheduled with [facility] Mammogram: up to date Bone Density: completed in 2019- normal  Recommended yearly ophthalmology/optometry visit for glaucoma screening and checkup Recommended yearly dental visit for hygiene and checkup  Vaccinations: Influenza vaccine: Come back Mid Sept on fri from 8-12 for high dose flu vaccine Pneumococcal vaccine: up to date Tdap vaccine: up to date  Shingles vaccine: info given    Advanced directives: form given     Next appointment: wellness in 1 year   Preventive Care 70 Years and Older, Female Preventive care refers to lifestyle choices and visits with your health care provider that can promote health and wellness. What does preventive care include?  A yearly physical exam. This is also called an annual well check.  Dental exams once or twice a year.  Routine eye exams. Ask your health care provider how often you should have your eyes checked.  Personal lifestyle choices, including:  Daily care of your teeth and gums.  Regular physical activity.  Eating a healthy diet.  Avoiding tobacco and drug use.  Limiting alcohol use.  Practicing safe sex.  Taking low-dose aspirin every day.  Taking vitamin and mineral supplements as recommended by your health care provider. What happens during an annual well check? The services and screenings done by your health care provider during your annual well check will depend on your age, overall health, lifestyle risk factors, and family history of disease. Counseling  Your health care provider may ask you questions about your:  Alcohol use.  Tobacco use.  Drug use.  Emotional well-being.  Home and  relationship well-being.  Sexual activity.  Eating habits.  History of falls.  Memory and ability to understand (cognition).  Work and work Statistician.  Reproductive health. Screening  You may have the following tests or measurements:  Height, weight, and BMI.  Blood pressure.  Lipid and cholesterol levels. These may be checked every 5 years, or more frequently if you are over 70 years old.  Skin check.  Lung cancer screening. You may have this screening every year starting at age 36 if you have a 30-pack-year history of smoking and currently smoke or have quit within the past 15 years.  Fecal occult blood test (FOBT) of the stool. You may have this test every year starting at age 36.  Flexible sigmoidoscopy or colonoscopy. You may have a sigmoidoscopy every 5 years or a colonoscopy every 10 years starting at age 70.  Hepatitis C blood test.  Hepatitis B blood test.  Sexually transmitted disease (STD) testing.  Diabetes screening. This is done by checking your blood sugar (glucose) after you have not eaten for a while (fasting). You may have this done every 1-3 years.  Bone density scan. This is done to screen for osteoporosis. You may have this done starting at age 70.  Mammogram. This may be done every 1-2 years. Talk to your health care provider about how often you should have regular mammograms. Talk with your health care provider about your test results, treatment options, and if necessary, the need for more tests. Vaccines  Your health care provider may recommend certain vaccines, such as:  Influenza vaccine. This is recommended every year.  Tetanus, diphtheria, and acellular  pertussis (Tdap, Td) vaccine. You may need a Td booster every 10 years.  Zoster vaccine. You may need this after age 70.  Pneumococcal 13-valent conjugate (PCV13) vaccine. One dose is recommended after age 70.  Pneumococcal polysaccharide (PPSV23) vaccine. One dose is recommended after  age 69. Talk to your health care provider about which screenings and vaccines you need and how often you need them. This information is not intended to replace advice given to you by your health care provider. Make sure you discuss any questions you have with your health care provider. Document Released: 12/01/2015 Document Revised: 07/24/2016 Document Reviewed: 09/05/2015 Elsevier Interactive Patient Education  2017 [place] Prevention in the Home Falls can cause injuries. They can happen to people of all ages. There are many things you can do to make your home safe and to help prevent falls. What can I do on the outside of my home?  Regularly fix the edges of walkways and driveways and fix any cracks.  Remove anything that might make you trip as you walk through a door, such as a raised step or threshold.  Trim any bushes or trees on the path to your home.  Use bright outdoor lighting.  Clear any walking paths of anything that might make someone trip, such as rocks or tools.  Regularly check to see if handrails are loose or broken. Make sure that both sides of any steps have handrails.  Any raised decks and porches should have guardrails on the edges.  Have any leaves, snow, or ice cleared regularly.  Use sand or salt on walking paths during winter.  Clean up any spills in your garage right away. This includes oil or grease spills. What can I do in the bathroom?  Use night lights.  Install grab bars by the toilet and in the tub and shower. Do not use towel bars as grab bars.  Use non-skid mats or decals in the tub or shower.  If you need to sit down in the shower, use a plastic, non-slip stool.  Keep the floor dry. Clean up any water that spills on the floor as soon as it happens.  Remove soap buildup in the tub or shower regularly.  Attach bath mats securely with double-sided non-slip rug tape.  Do not have throw rugs and other things on the floor that can  make you trip. What can I do in the bedroom?  Use night lights.  Make sure that you have a light by your bed that is easy to reach.  Do not use any sheets or blankets that are too big for your bed. They should not hang down onto the floor.  Have a firm chair that has side arms. You can use this for support while you get dressed.  Do not have throw rugs and other things on the floor that can make you trip. What can I do in the kitchen?  Clean up any spills right away.  Avoid walking on wet floors.  Keep items that you use a lot in easy-to-reach places.  If you need to reach something above you, use a strong step stool that has a grab bar.  Keep electrical cords out of the way.  Do not use floor polish or wax that makes floors slippery. If you must use wax, use non-skid floor wax.  Do not have throw rugs and other things on the floor that can make you trip. What can I do with my stairs?  Do not leave any items on the stairs.  Make sure that there are handrails on both sides of the stairs and use them. Fix handrails that are broken or loose. Make sure that handrails are as long as the stairways.  Check any carpeting to make sure that it is firmly attached to the stairs. Fix any carpet that is loose or worn.  Avoid having throw rugs at the top or bottom of the stairs. If you do have throw rugs, attach them to the floor with carpet tape.  Make sure that you have a light switch at the top of the stairs and the bottom of the stairs. If you do not have them, ask someone to add them for you. What else can I do to help prevent falls?  Wear shoes that:  Do not have high heels.  Have rubber bottoms.  Are comfortable and fit you well.  Are closed at the toe. Do not wear sandals.  If you use a stepladder:  Make sure that it is fully opened. Do not climb a closed stepladder.  Make sure that both sides of the stepladder are locked into place.  Ask someone to hold it for you,  if possible.  Clearly mark and make sure that you can see:  Any grab bars or handrails.  First and last steps.  Where the edge of each step is.  Use tools that help you move around (mobility aids) if they are needed. These include:  Canes.  Walkers.  Scooters.  Crutches.  Turn on the lights when you go into a dark area. Replace any light bulbs as soon as they burn out.  Set up your furniture so you have a clear path. Avoid moving your furniture around.  If any of your floors are uneven, fix them.  If there are any pets around you, be aware of where they are.  Review your medicines with your doctor. Some medicines can make you feel dizzy. This can increase your chance of falling. Ask your doctor what other things that you can do to help prevent falls. This information is not intended to replace advice given to you by your health care provider. Make sure you discuss any questions you have with your health care provider. Document Released: 08/31/2009 Document Revised: 04/11/2016 Document Reviewed: 12/09/2014 Elsevier Interactive Patient Education  2017 [employer].

## 2020-07-17 NOTE — Progress Notes (Signed)
Subjective:   Sophia [name] is a 69 y.o. female who presents for Medicare Annual (Subsequent) preventive examination.  Review of Systems     Cardiac Risk Factors include: advanced age (>44men, >47 women);dyslipidemia;hypertension;smoking/ tobacco exposure     Objective:    Today's Vitals   07/17/20 1116  BP: 104/68  Weight: 194 lb (88 kg)  Height: 5\' 9"  (1.753 m)   Body mass index is 28.65 kg/m.  Advanced Directives 07/17/2020 08/31/2019 08/29/2019 08/29/2019 07/14/2019 05/01/2019 07/06/2018  Does Patient Have a Medical Advance Directive? No No No No No No No  Would patient like information on creating a medical advance directive? Yes (ED - Information included in AVS) - - No - Patient declined No - Patient declined No - Patient declined Yes (ED - Information included in AVS)  Pre-existing out of facility DNR order (yellow form or pink MOST form) - - - - - - -    Current Medications (verified) Outpatient Encounter Medications as of 07/17/2020  Medication Sig  . alprazolam (XANAX) 2 MG tablet Take one tablet by mouth three times weekly, as needed, for uncontrolled anxiety and panic  . amLODipine (NORVASC) 10 MG tablet Take 1 tablet (10 mg total) by mouth daily.  . Ascorbic Acid (VITAMIN C) 500 MG CAPS Take 1 capsule by mouth daily.  Marland Kitchen aspirin EC 81 MG tablet Take 81 mg by mouth daily.  . AZO-CRANBERRY PO Take 2 tablets by mouth 2 (two) times daily as needed (UTI).  Marland Kitchen benazepril-hydrochlorthiazide (LOTENSIN HCT) 20-12.5 MG tablet Take 2 tablets by mouth daily.  . busPIRone (BUSPAR) 7.5 MG tablet Take 1 tablet (7.5 mg total) by mouth 3 (three) times daily.  . Cholecalciferol (VITAMIN D3) 250 MCG (10000 UT) capsule Take 10,000 Units by mouth daily.  . ferrous sulfate 325 (65 FE) MG EC tablet Take 325 mg by mouth daily with breakfast.  . FLUoxetine (PROZAC) 40 MG capsule Take 1 capsule (40 mg total) by mouth daily.  Marland Kitchen gabapentin (NEURONTIN) 100 MG capsule Take one capsule by mouth  three times daily  . Krill Oil (OMEGA-3) 500 MG CAPS Take 1 capsule by mouth daily.  . Multiple Vitamin (MULTIVITAMIN WITH MINERALS) TABS tablet Take 1 tablet by mouth daily.  . pantoprazole (PROTONIX) 40 MG tablet TAKE 1 TABLET EVERY DAY  . pravastatin (PRAVACHOL) 40 MG tablet Take 1 tablet (40 mg total) by mouth every evening.   No facility-administered encounter medications on file as of 07/17/2020.    Allergies (verified) Patient has no known allergies.   History: Past Medical History:  Diagnosis Date  . Acute respiratory disease due to COVID-19 virus 08/28/2019  . Anxiety   . Arthritis   . Depression   . Diabetes mellitus type 2 in obese [facility]) 11/28/2010   Qualifier: Diagnosis of  By: Moshe Cipro MD, Margaret  Diet controlled in 12/2016   . Diabetes mellitus without complication ([place])   . GERD (gastroesophageal reflux disease)   . Hypercholesteremia   . Hypertension   . PONV (postoperative nausea and vomiting)    Past Surgical History:  Procedure Laterality Date  . ABDOMINAL HYSTERECTOMY  1982   tubes and womb, bleeding and ectopic  . ABDOMINAL SURGERY     removal of tumors  . BREAST SURGERY Left 1983   benign tumor  . CATARACT EXTRACTION W/PHACO Right 01/11/2014   Procedure: CATARACT EXTRACTION PHACO AND INTRAOCULAR LENS PLACEMENT (IOC);  Surgeon: Elta Guadeloupe T. Gershon Crane, MD;  Location: [facility];  Service: Ophthalmology;  Laterality:  Right;  CDE 5.57  . CATARACT EXTRACTION W/PHACO Left 01/25/2014   Procedure: CATARACT EXTRACTION PHACO AND INTRAOCULAR LENS PLACEMENT (IOC);  Surgeon: Elta Guadeloupe T. Gershon Crane, MD;  Location: [facility];  Service: Ophthalmology;  Laterality: Left;  CDE:5.19  . COLONOSCOPY  2007 [facility]   NL EXAM  . COLONOSCOPY N/A 04/22/2014   Dr. Fields:Normal mucosa in the terminal ileum/Two COLON polyps REMOVED/ Mild diverticulosis in the ascending colon and transverse colon/The LEFT colon IS redundant/Small internal hemorrhoids. Path: tubular adenoma. Next colonoscopy in 5-10 years  .  ESOPHAGOGASTRODUODENOSCOPY N/A 04/22/2014   Dr. Fields:MICROCYTIC ANEMIA MOST LIKELY DUE TO ASA/VOLTAREN/Small hiatal hernia/MODERATE Non-erosive gastritis. Negative H.pylori  . LEFT HEART CATH AND CORONARY ANGIOGRAPHY N/A 06/23/2018   Procedure: LEFT HEART CATH AND CORONARY ANGIOGRAPHY;  Surgeon: Jettie Booze, MD;  Location: [facility];  Service: Cardiovascular;  Laterality: N/A;  . 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 ULTRASOUND GUIDANCE FOR VASCULAR ACCESS  06/23/2018   Procedure: Ultrasound Guidance For Vascular Access;  Surgeon: Jettie Booze, MD;  Location: [place] [facility];  Service: Cardiovascular;;  . UPPER GASTROINTESTINAL ENDOSCOPY  2007 CHEST PAIN   NL EXAM  . YAG LASER APPLICATION Right 0/76/2263   Procedure: YAG LASER APPLICATION;  Surgeon: Elta Guadeloupe T. Gershon Crane, MD;  Location: [facility];  Service: Ophthalmology;  Laterality: Right;  . YAG LASER APPLICATION Left 3/35/4562   Procedure: YAG LASER APPLICATION;  Surgeon: Rutherford Guys, MD;  Location: [facility];  Service: Ophthalmology;  Laterality: Left;   Family History  Problem Relation Age of Onset  . Colon polyps Sister 42  . Diabetes Sister   . Hypertension Sister   . Kidney disease Sister   . Arthritis Father   . Cancer Father        prostate   . Hypertension Father   . Diabetes Sister   . Hypertension Sister   . CAD Paternal Grandmother   . Colon cancer Neg Hx    Social History   Socioeconomic History  . Marital status: Married    Spouse name: Not on file  . Number of children: Not on file  . Years of education: Not on file  . Highest education level: Not on file  Occupational History  . Not on file  Tobacco Use  . Smoking status: Former Smoker    Packs/day: 0.25    Years: 20.00    Pack years: 5.00    Types: Cigarettes    Quit date: 01/05/1990    Years since quitting: 30.5  . Smokeless tobacco: Current User    Types: Snuff  Substance and Sexual Activity  . Alcohol use: No  . Drug  use: Yes    Types: Marijuana  . Sexual activity: Not Currently    Birth control/protection: Surgical  Other Topics Concern  . Not on file  Social History Narrative  . Not on file   Social Determinants of Health   Financial Resource Strain: Low Risk   . Difficulty of Paying Living Expenses: Not hard at all  Food Insecurity: No Food Insecurity  . Worried About Charity fundraiser in the Last Year: Never true  . Ran Out of Food in the Last Year: Never true  Transportation Needs: No Transportation Needs  . Lack of Transportation (Medical): No  . Lack of Transportation (Non-Medical): No  Physical Activity: Insufficiently Active  . Days of Exercise per Week: 3 days  . Minutes of Exercise per Session: 30 min  Stress: No  Stress Concern Present  . Feeling of Stress : Only a little  Social Connections: Moderately Integrated  . Frequency of Communication with Friends and Family: More than three times a week  . Frequency of Social Gatherings with Friends and Family: Twice a week  . Attends Religious Services: 1 to 4 times per year  . Active Member of Clubs or Organizations: No  . Attends Archivist Meetings: Never  . Marital Status: Married    Tobacco Counseling Ready to quit: Not Answered Counseling given: Not Answered   Clinical Intake:  Pre-visit preparation completed: Yes  Pain : No/denies pain     Nutritional Status: BMI 25 -29 Overweight Diabetes: No  How often do you need to have someone help you when you read instructions, pamphlets, or other written materials from your doctor or pharmacy?: 1 - Never What is the last grade level you completed in school?: 9th grade  Diabetic? yes  Interpreter Needed?: No      Activities of Daily Living In your present state of health, do you have any difficulty performing the following activities: 07/17/2020 08/29/2019  Hearing? N N  Vision? N N  Difficulty concentrating or making decisions? N N  Walking or climbing  stairs? N N  Dressing or bathing? N N  Doing errands, shopping? N -  Preparing Food and eating ? N -  Using the Toilet? N -  In the past six months, have you accidently leaked urine? N -  Do you have problems with loss of bowel control? N -  Managing your Medications? N -  Managing your Finances? N -  Housekeeping or managing your Housekeeping? N -  Some recent data might be hidden    Patient Care Team: Fayrene Helper, MD as PCP - General Josue Hector, MD as PCP - Cardiology (Cardiology) Danie Binder, MD (Inactive) as Consulting Physician (Gastroenterology) Rutherford Guys, MD as Consulting Physician (Ophthalmology) Carole Civil, MD as Consulting Physician (Orthopedic Surgery) [name], DO as Consulting Physician (Internal Medicine)  Indicate any recent Medical Services you may have received from other than [facility] providers in the past year (date may be approximate).     Assessment:   This is a routine wellness examination for Sophia [name].  Hearing/Vision screen No exam data present  Dietary issues and exercise activities discussed: Current Exercise Habits: Home exercise routine, Type of exercise: Other - see comments (rides stationary bike), Time (Minutes): 30, Frequency (Times/Week): 4, Weekly Exercise (Minutes/Week): 120, Intensity: Moderate  Goals    . Blood Pressure < 140/90    . DIET - REDUCE PORTION SIZE    . LIFESTYLE - DECREASE FALLS RISK      Depression Screen PHQ 2/9 Scores 12/07/2019 07/14/2019 06/01/2019 02/02/2019 12/02/2018 08/26/2018 07/06/2018  PHQ - 2 Score 3 1 0 - 1 0 0  PHQ- 9 Score 11 - - - 1 - 5  Exception Documentation - - - Patient refusal - - -    Fall Risk Fall Risk  07/17/2020 06/08/2020 07/14/2019 06/01/2019 02/02/2019  Falls in the past year? 0 0 0 0 0  Number falls in past yr: 0 0 0 0 0  Injury with Fall? 0 0 0 0 0  Follow up - - Falls evaluation completed;Education provided;Falls prevention discussed - -    Any stairs in or  around the home? Yes  If so, are there any without handrails? No  Home free of loose throw rugs in walkways, pet beds, electrical cords,  etc? Yes  Adequate lighting in your home to reduce risk of falls? Yes   ASSISTIVE DEVICES UTILIZED TO PREVENT FALLS:  Life alert? No  Use of a cane, walker or w/c? Yes  Grab bars in the bathroom? Yes  Shower chair or bench in shower? Yes  Elevated toilet seat or a handicapped toilet? Yes   TIMED UP AND GO:  Was the test performed? Yes .  Length of time to ambulate 10 feet: 8 sec.   Gait slow and steady without use of assistive device  Cognitive Function:     6CIT Screen 07/17/2020 07/14/2019 07/06/2018  What Year? 0 points 0 points 0 points  What month? 0 points 0 points 0 points  What time? - 0 points 0 points  Count back from 20 4 points 0 points 0 points  Months in reverse - 4 points 4 points  Repeat phrase - 2 points 2 points  Total Score - 6 6    Immunizations Immunization History  Administered Date(s) Administered  . Fluad Quad(high Dose 65+) 08/16/2019  . Influenza Split 08/19/2014  . Influenza Whole 09/20/1996  . Influenza,inj,Quad PF,6+ Mos 09/15/2015, 08/06/2016, 07/06/2018  . Moderna SARS-COVID-2 Vaccination 01/12/2020, 02/09/2020  . Pneumococcal Conjugate-13 01/25/2015  . Pneumococcal Polysaccharide-23 11/28/2010, 03/25/2016  . Tdap 04/29/2014  . Zoster 11/28/2010    TDAP status: Up to date Flu Vaccine status: Up to date Pneumococcal vaccine status: Up to date Covid-19 vaccine status: Completed vaccines  Qualifies for Shingles Vaccine? Yes   Zostavax completed Yes   Shingrix Completed?: No.    Education has been provided regarding the importance of this vaccine. Patient has been advised to call insurance company to determine out of pocket expense if they have not yet received this vaccine. Advised may also receive vaccine at local pharmacy or Health Dept. Verbalized acceptance and understanding.  Screening  Tests Health Maintenance  Topic Date Due  . COLONOSCOPY  04/23/2019  . INFLUENZA VACCINE  06/18/2020  . FOOT EXAM  12/06/2020  . HEMOGLOBIN A1C  12/09/2020  . OPHTHALMOLOGY EXAM  01/23/2021  . MAMMOGRAM  07/10/2022  . TETANUS/TDAP  04/29/2024  . DEXA SCAN  Completed  . COVID-19 Vaccine  Completed  . Hepatitis C Screening  Completed  . PNA vac Low Risk Adult  Completed    Health Maintenance  Health Maintenance Due  Topic Date Due  . COLONOSCOPY  04/23/2019  . INFLUENZA VACCINE  06/18/2020    Colorectal cancer screening: Completed 2015. Repeat every 5 years Mammogram status: Completed yes. Repeat every year Bone Density status: Completed 2019. Results reflect: Bone density results: NORMAL. Repeat every 3 years.  Lung Cancer Screening: (Low Dose CT Chest recommended if Age 79-80 years, 30 pack-year currently smoking OR have quit w/in 15years.) does not qualify.   Lung Cancer Screening Referral: no  Additional Screening:    Hepatitis C Screening: does qualify; Completed   Vision Screening: Recommended annual ophthalmology exams for early detection of glaucoma and other disorders of the eye. Is the patient up to date with their annual eye exam?  Yes  Who is the provider or what is the name of the office in which the patient attends annual eye exams? [facility] If pt is not established with a provider, would they like to be referred to a provider to establish care? No .   Dental Screening: Recommended annual dental exams for proper oral hygiene  Community Resource Referral / Chronic Care Management: CRR required this visit?  No  CCM required this visit?  No      Plan:     I have personally reviewed and noted the following in the patient's chart:   . Medical and social history . Use of alcohol, tobacco or illicit drugs  . Current medications and supplements . Functional ability and status . Nutritional status . Physical activity . Advanced directives . List of  other physicians . Hospitalizations, surgeries, and ER visits in previous 12 months . Vitals . Screenings to include cognitive, depression, and falls . Referrals and appointments  In addition, I have reviewed and discussed with patient certain preventive protocols, quality metrics, and best practice recommendations. A written personalized care plan for preventive services as well as general preventive health recommendations were provided to patient.     Kate Sable, LPN, LPN   01/16/3142   Nurse Notes: Visit comepleted in person in office. Provider in the office and time spent with pt 30 mins.

## 2020-07-18 ENCOUNTER — Encounter: Payer: Medicare PPO | Admitting: Family Medicine

## 2020-07-26 ENCOUNTER — Ambulatory Visit (INDEPENDENT_AMBULATORY_CARE_PROVIDER_SITE_OTHER): Payer: Self-pay | Admitting: *Deleted

## 2020-07-26 ENCOUNTER — Other Ambulatory Visit: Payer: Self-pay

## 2020-07-26 VITALS — Ht 69.0 in | Wt 191.6 lb

## 2020-07-26 DIAGNOSIS — Z86010 Personal history of colonic polyps: Secondary | ICD-10-CM

## 2020-07-26 NOTE — Progress Notes (Signed)
Pt came in but decided to hold off on scheduling a procedure right now.  She has a lot going on and her brother is not doing well.  Pt will call us back to reschedule when things are better.

## 2020-08-04 ENCOUNTER — Ambulatory Visit (INDEPENDENT_AMBULATORY_CARE_PROVIDER_SITE_OTHER): Payer: Medicare PPO

## 2020-08-04 ENCOUNTER — Other Ambulatory Visit: Payer: Self-pay

## 2020-08-04 DIAGNOSIS — Z23 Encounter for immunization: Secondary | ICD-10-CM

## 2020-08-09 ENCOUNTER — Other Ambulatory Visit: Payer: Self-pay | Admitting: Family Medicine

## 2020-08-10 ENCOUNTER — Other Ambulatory Visit: Payer: Self-pay | Admitting: Family Medicine

## 2020-09-01 ENCOUNTER — Other Ambulatory Visit: Payer: Self-pay | Admitting: Family Medicine

## 2020-09-01 DIAGNOSIS — E559 Vitamin D deficiency, unspecified: Secondary | ICD-10-CM | POA: Diagnosis not present

## 2020-09-01 DIAGNOSIS — I10 Essential (primary) hypertension: Secondary | ICD-10-CM | POA: Diagnosis not present

## 2020-09-01 DIAGNOSIS — E785 Hyperlipidemia, unspecified: Secondary | ICD-10-CM | POA: Diagnosis not present

## 2020-09-02 LAB — TSH: TSH: 1.27 mIU/L (ref 0.40–4.50)

## 2020-09-02 LAB — LIPID PANEL
Cholesterol: 185 mg/dL (ref <200)
HDL: 66 mg/dL (ref >=50)
LDL Cholesterol (Calc): 97 mg/dL (calc)
Non-HDL Cholesterol (Calc): 119 mg/dL (calc) (ref <130)
Total CHOL/HDL Ratio: 2.8 (calc) (ref <5.0)
Triglycerides: 122 mg/dL (ref <150)

## 2020-09-02 LAB — COMPLETE METABOLIC PANEL WITH GFR
AG Ratio: 2 (calc) (ref 1.0–2.5)
ALT: 14 U/L (ref 6–29)
AST: 14 U/L (ref 10–35)
Albumin: 5 g/dL (ref 3.6–5.1)
Alkaline phosphatase (APISO): 57 U/L (ref 37–153)
BUN/Creatinine Ratio: 32 (calc) — ABNORMAL HIGH (ref 6–22)
BUN: 31 mg/dL — ABNORMAL HIGH (ref 7–25)
CO2: 30 mmol/L (ref 20–32)
Calcium: 10.7 mg/dL — ABNORMAL HIGH (ref 8.6–10.4)
Chloride: 101 mmol/L (ref 98–110)
Creat: 0.97 mg/dL — ABNORMAL HIGH (ref 0.60–0.93)
GFR, Est African American: 69 mL/min/{1.73_m2} (ref >=60)
GFR, Est Non African American: 59 mL/min/{1.73_m2} — ABNORMAL LOW (ref >=60)
Globulin: 2.5 g/dL (calc) (ref 1.9–3.7)
Glucose, Bld: 96 mg/dL (ref 65–99)
Potassium: 4.2 mmol/L (ref 3.5–5.3)
Sodium: 142 mmol/L (ref 135–146)
Total Bilirubin: 0.4 mg/dL (ref 0.2–1.2)
Total Protein: 7.5 g/dL (ref 6.1–8.1)

## 2020-09-02 LAB — VITAMIN D 25 HYDROXY (VIT D DEFICIENCY, FRACTURES): Vit D, 25-Hydroxy: 39 ng/mL (ref 30–100)

## 2020-09-12 ENCOUNTER — Encounter: Payer: Self-pay | Admitting: Family Medicine

## 2020-09-12 ENCOUNTER — Ambulatory Visit (INDEPENDENT_AMBULATORY_CARE_PROVIDER_SITE_OTHER): Payer: Medicare PPO | Admitting: Family Medicine

## 2020-09-12 ENCOUNTER — Other Ambulatory Visit: Payer: Self-pay

## 2020-09-12 VITALS — BP 130/76 | HR 71 | Resp 16 | Ht 69.0 in | Wt 191.0 lb

## 2020-09-12 DIAGNOSIS — Z0000 Encounter for general adult medical examination without abnormal findings: Secondary | ICD-10-CM | POA: Diagnosis not present

## 2020-09-12 DIAGNOSIS — E785 Hyperlipidemia, unspecified: Secondary | ICD-10-CM

## 2020-09-12 DIAGNOSIS — I10 Essential (primary) hypertension: Secondary | ICD-10-CM

## 2020-09-12 DIAGNOSIS — R002 Palpitations: Secondary | ICD-10-CM

## 2020-09-12 DIAGNOSIS — E669 Obesity, unspecified: Secondary | ICD-10-CM

## 2020-09-12 DIAGNOSIS — E1169 Type 2 diabetes mellitus with other specified complication: Secondary | ICD-10-CM

## 2020-09-12 DIAGNOSIS — Z01810 Encounter for preprocedural cardiovascular examination: Secondary | ICD-10-CM | POA: Insufficient documentation

## 2020-09-12 NOTE — Progress Notes (Signed)
    Sophia [name]     MRN: [id number]      DOB: 1951-10-20  HPI: Patient is in for annual physical exam. Counseled re need to stop snuff, unwilling to set a quit date.  Recent labs, if available are reviewed. Immunization is reviewed , and  Is up to date  PE: BP 130/76   Pulse 71   Resp 16   Ht 5\' 9"  (1.753 m)   Wt 191 lb (86.6 kg)   SpO2 95%   BMI 28.21 kg/m   Pleasant  female, alert and oriented x 3, in no cardio-pulmonary distress. Afebrile. HEENT No facial trauma or asymetry. Sinuses non tender.  Extra occullar muscles intact.. External ears normal, . Neck: supple, no adenopathy,JVD or thyromegaly.No bruits.  Chest: Clear to ascultation bilaterally.No crackles or wheezes. Non tender to palpation  Breast: Not examined Normal mammogram in 06/2020  Cardiovascular system; Heart sounds normal,  S1 and  S2 ,no S3.  No murmur, or thrill. Apical beat not displaced Peripheral pulses normal.  Abdomen: Soft, non tender, no organomegaly or masses. No bruits. Bowel sounds normal. No guarding, tenderness or rebound.    Musculoskeletal exam: Decreased though adequate  ROM of spine, hips , shoulders and knees. Mild  deformity ,swelling or crepitus noted. No muscle wasting or atrophy.   Neurologic: Cranial nerves 2 to 12 intact. Power, tone ,sensation and reflexes normal throughout. No disturbance in gait. No tremor.  Skin: Intact, no ulceration, erythema , scaling or rash noted. Pigmentation normal throughout  Psych; Normal mood and affect. Judgement and concentration normal   Assessment & Plan:  Encounter for annual general medical examination without abnormal findings in adult Annual exam as documented. Counseling done  re healthy lifestyle involving commitment to 150 minutes exercise per week, heart healthy diet, and attaining healthy weight.The importance of adequate sleep also discussed. Regular seat belt use and home safety, is also discussed. Changes  in health habits are decided on by the patient with goals and time frames  set for achieving them. Immunization and cancer screening needs are specifically addressed at this visit.

## 2020-09-12 NOTE — Assessment & Plan Note (Signed)

## 2020-09-12 NOTE — Patient Instructions (Addendum)
Follow-up in office with MD in January call if you need me sooner.  Please continue to work on stopping using snuff.  Nonfasting Chem-7 and EGFR with calcium as well as hemoglobin A1c and CBC 5 days before follow-up visit.  Please stop azo and iron tablets  Keep up the great health habits and continue to work on dropping the bad ones!  Think about what you will eat, plan ahead. Choose " clean, green, fresh or frozen" over canned, processed or packaged foods which are more sugary, salty and fatty. 70 to 75% of food eaten should be vegetables and fruit. Three meals at set times with snacks allowed between meals, but they must be fruit or vegetables. Aim to eat over a 12 hour period , example 7 am to 7 pm, and STOP after  your last meal of the day. Drink water,generally about 64 ounces per day, no other drink is as healthy. Fruit juice is best enjoyed in a healthy way, by EATING the fruit. It is important that you exercise regularly at least 30 minutes 5 times a week. If you develop chest pain, have severe difficulty breathing, or feel very tired, stop exercising immediately and seek medical attention  Thanks for choosing [facility], we consider it a privelige to serve you.

## 2020-09-17 ENCOUNTER — Encounter: Payer: Self-pay | Admitting: Family Medicine

## 2020-09-19 ENCOUNTER — Ambulatory Visit (INDEPENDENT_AMBULATORY_CARE_PROVIDER_SITE_OTHER): Payer: Self-pay | Admitting: *Deleted

## 2020-09-19 ENCOUNTER — Other Ambulatory Visit: Payer: Self-pay

## 2020-09-19 VITALS — Ht 69.0 in | Wt 186.8 lb

## 2020-09-19 DIAGNOSIS — Z86010 Personal history of colonic polyps: Secondary | ICD-10-CM

## 2020-09-19 MED ORDER — PEG 3350-KCL-NA BICARB-NACL 420 G PO SOLR: 4000.0000 mL | Freq: Once | ORAL | 0 refills | Status: AC

## 2020-09-19 NOTE — Patient Instructions (Addendum)
Sophia [name]   February 19, 1951 MRN: [id number]    Procedure Date: 11/27/2020 Time to register: 6:30 am Place to register: [facility] Procedure Time: 8:00 am Scheduled provider: Dr. Abbey Chatters  PREPARATION FOR COLONOSCOPY WITH TRI-LYTE SPLIT PREP  Please notify us immediately if you are diabetic, take iron supplements, or if you are on Coumadin or any other blood thinners.   Please hold the following medications: n/a  You will need to purchase 1 fleet enema and 1 box of Bisacodyl $RemoveBefo'5mg'KUBsBnyvJjh$  tablets.   2 DAYS BEFORE PROCEDURE:  DATE: 11/25/2020  DAY: Saturday Begin clear liquid diet AFTER your lunch meal. NO SOLID FOODS after this point.  1 DAY BEFORE PROCEDURE:  DATE: 11/26/2020   DAY: Sunday Continue clear liquids the entire day - NO SOLID FOOD.   Diabetic medications adjustments for today: n/a  At 2:00 pm:  Take 2 Bisacodyl tablets.   At 4:00pm:  Start drinking your solution. Make sure you mix well per instructions on the bottle. Try to drink 1 (one) 8 ounce glass every 10-15 minutes until you have consumed HALF the jug. You should complete by 6:00pm.You must keep the left over solution refrigerated until completed next day.  Continue clear liquids. You must drink plenty of clear liquids to prevent dehyration and kidney failure.     DAY OF PROCEDURE:   DATE: 11/27/2020   DAY: Monday If you take medications for your heart, blood pressure or breathing, you may take these medications.  Diabetic medications adjustments for today: n/a  Five hours before your procedure time @ 3:00 am:  Finish remaining amout of bowel prep, drinking 1 (one) 8 ounce glass every 10-15 minutes until complete. You have two hours to consume remaining prep.   Three hours before your procedure time @ 5:00 am:  Nothing by mouth.   At least one hour before going to the hospital:  Give yourself one Fleet enema. You may take your morning medications with sip of water unless we have instructed otherwise.       Please see below for Dietary Information.  CLEAR LIQUIDS INCLUDE:  Water Jello (NOT red in color)   Ice Popsicles (NOT red in color)   Tea (sugar ok, no milk/cream) Powdered fruit flavored drinks  Coffee (sugar ok, no milk/cream) Gatorade/ Lemonade/ Kool-Aid  (NOT red in color)   Juice: apple, white grape, white cranberry Soft drinks  Clear bullion, consomme, broth (fat free beef/chicken/vegetable)  Carbonated beverages (any kind)  Strained chicken noodle soup Hard Candy   Remember: Clear liquids are liquids that will allow you to see your fingers on the other side of a clear glass. Be sure liquids are NOT red in color, and not cloudy, but CLEAR.  DO NOT EAT OR DRINK ANY OF THE FOLLOWING:  Dairy products of any kind   Cranberry juice Tomato juice / V8 juice   Grapefruit juice Orange juice     Red grape juice  Do not eat any solid foods, including such foods as: cereal, oatmeal, yogurt, fruits, vegetables, creamed soups, eggs, bread, crackers, pureed foods in a blender, etc.   HELPFUL HINTS FOR DRINKING PREP SOLUTION:   Make sure prep is extremely cold. Mix and refrigerate the the morning of the prep. You may also put in the freezer.   You may try mixing some Crystal Light or Country Time Lemonade if you prefer. Mix in small amounts; add more if necessary.  Try drinking through a straw  Rinse mouth with water or  a mouthwash between glasses, to remove after-taste.  Try sipping on a cold beverage /ice/ popsicles between glasses of prep.  Place a piece of sugar-free hard candy in mouth between glasses.  If you become nauseated, try consuming smaller amounts, or stretch out the time between glasses. Stop for 30-60 minutes, then slowly start back drinking.        OTHER INSTRUCTIONS  You will need a responsible adult at least 70 years of age to accompany you and drive you home. This person must remain in the waiting room during your procedure. The hospital will cancel  your procedure if you do not have a responsible adult with you.   1. Wear loose fitting clothing that is easily removed. 2. Leave jewelry and other valuables at home.  3. Remove all body piercing jewelry and leave at home. 4. Total time from sign-in until discharge is approximately 2-3 hours. 5. You should go home directly after your procedure and rest. You can resume normal activities the day after your procedure. 6. The day of your procedure you should not:  Drive  Make legal decisions  Operate machinery  Drink alcohol  Return to work   You may call the office (Dept: [phone]) before 5:00pm, or page the doctor on call [phone]) after 5:00pm, for further instructions, if necessary.   Insurance Information YOU WILL NEED TO CHECK WITH YOUR INSURANCE COMPANY FOR THE BENEFITS OF COVERAGE YOU HAVE FOR THIS PROCEDURE.  UNFORTUNATELY, NOT ALL INSURANCE COMPANIES HAVE BENEFITS TO COVER ALL OR PART OF THESE TYPES OF PROCEDURES.  IT IS YOUR RESPONSIBILITY TO CHECK YOUR BENEFITS, HOWEVER, WE WILL BE GLAD TO ASSIST YOU WITH ANY CODES YOUR INSURANCE COMPANY MAY NEED.    PLEASE NOTE THAT MOST INSURANCE COMPANIES WILL NOT COVER A SCREENING COLONOSCOPY FOR PEOPLE UNDER THE AGE OF 50  IF YOU HAVE BCBS INSURANCE, YOU MAY HAVE BENEFITS FOR A SCREENING COLONOSCOPY BUT IF POLYPS ARE FOUND THE DIAGNOSIS WILL CHANGE AND THEN YOU MAY HAVE A DEDUCTIBLE THAT WILL NEED TO BE MET. SO PLEASE MAKE SURE YOU CHECK YOUR BENEFITS FOR A SCREENING COLONOSCOPY AS WELL AS A DIAGNOSTIC COLONOSCOPY.

## 2020-09-19 NOTE — Progress Notes (Addendum)
Gastroenterology Pre-Procedure Review  Request Date: 09/19/2020 Requesting Physician: Dr. Moshe Cipro, Last TCS done 04/22/2014 by Dr. Oneida Alar, tubular adenoma, family hx of colon cancer (aunt)  PATIENT REVIEW QUESTIONS: The patient responded to the following health history questions as indicated:    1. Diabetes Melitis: yes, but controlled without medicine 2. Joint replacements in the past 12 months: no 3. Major health problems in the past 3 months: no 4. Has an artificial valve or MVP: no 5. Has a defibrillator: no 6. Has been advised in past to take antibiotics in advance of a procedure like teeth cleaning: no 7. Family history of colon cancer: yes, aunt: age 57's  8. Alcohol Use: no 9. Illicit drug Use: yes, occasional marijuana 10. History of sleep apnea: yes, but doesn't use it because there was a recall on machine 11. History of coronary artery or other vascular stents placed within the last 12 months: no 12. History of any prior anesthesia complications: no 13. Body mass index is 27.59 kg/m.    MEDICATIONS & ALLERGIES:    Patient reports the following regarding taking any blood thinners:   Plavix? no Aspirin? yes Coumadin? no Brilinta? no Xarelto? no Eliquis? no Pradaxa? no Savaysa? no Effient? no  Patient confirms/reports the following medications:  Current Outpatient Medications  Medication Sig Dispense Refill   alprazolam (XANAX) 2 MG tablet Take one tablet by mouth three times weekly, as needed, for uncontrolled anxiety and panic 12 tablet 5   amLODipine (NORVASC) 10 MG tablet TAKE 1 TABLET EVERY DAY 90 tablet 0   Ascorbic Acid (VITAMIN C) 500 MG CAPS Take 1 capsule by mouth daily.     aspirin EC 81 MG tablet Take 81 mg by mouth daily.     benazepril-hydrochlorthiazide (LOTENSIN HCT) 20-12.5 MG tablet TAKE 2 TABLETS EVERY DAY 180 tablet 0   busPIRone (BUSPAR) 7.5 MG tablet Take 1 tablet (7.5 mg total) by mouth 3 (three) times daily. 270 tablet 3   Cholecalciferol  (VITAMIN D3) 250 MCG (10000 UT) capsule Take 10,000 Units by mouth daily.     FLUoxetine (PROZAC) 40 MG capsule TAKE 1 CAPSULE EVERY DAY 90 capsule 0   gabapentin (NEURONTIN) 100 MG capsule TAKE 1 CAPSULE THREE TIMES DAILY 270 capsule 0   Krill Oil (OMEGA-3) 500 MG CAPS Take 1 capsule by mouth daily.     Multiple Vitamin (MULTIVITAMIN WITH MINERALS) TABS tablet Take 1 tablet by mouth daily.     pantoprazole (PROTONIX) 40 MG tablet TAKE 1 TABLET EVERY DAY 90 tablet 3   pravastatin (PRAVACHOL) 40 MG tablet TAKE 1 TABLET EVERY EVENING. 90 tablet 0   No current facility-administered medications for this visit.    Patient confirms/reports the following allergies:  No Known Allergies  No orders of the defined types were placed in this encounter.   AUTHORIZATION INFORMATION Primary Insurance: [place],  [place] #: [id number],  Group #: [id number] Pre-Cert / Josem Kaufmann required: Yes, approved by Otila Kluver 22/12/9796-07/20/1193 Pre-Cert / Auth #: [id number]  SCHEDULE INFORMATION: Procedure has been scheduled as follows:  Date: 10/23/2020, Time: 10:00 Location: [facility] with Dr. Abbey Chatters  This Gastroenterology Pre-Precedure Review Form is being routed to the following provider(s): Neil Crouch, PA

## 2020-09-20 NOTE — Progress Notes (Signed)
Ok to schedule with Abbey Chatters. ASA II.

## 2020-09-21 ENCOUNTER — Other Ambulatory Visit: Payer: Self-pay | Admitting: *Deleted

## 2020-10-18 ENCOUNTER — Telehealth: Payer: Self-pay | Admitting: *Deleted

## 2020-10-18 NOTE — Telephone Encounter (Signed)
Called pt and informed her that we needed to reschedule her procedure due to a death in Dr. Ave Filter family.  Pt rescheduled her Covid screening to 11/24/2020 at 8:10.  Pt rescheduled her procedure to 11/27/2020 at 8:00 with arrival at 6:30.  Pt aware that I am mailing out prep instructions.  Hoyle Sauer in Endo notified.

## 2020-10-18 NOTE — Telephone Encounter (Signed)
Called [place] and spoke to Keddie.  He updated authorization for date of service 11/27/2020.  It is good until 12/27/2020.  Authorization #: [id number]

## 2020-10-20 ENCOUNTER — Other Ambulatory Visit (HOSPITAL_COMMUNITY): Payer: Medicare PPO

## 2020-11-24 ENCOUNTER — Other Ambulatory Visit (HOSPITAL_COMMUNITY)
Admission: RE | Admit: 2020-11-24 | Discharge: 2020-11-24 | Disposition: A | Discharge status: Home / Self Care | Payer: Medicare PPO | Source: Ambulatory Visit | Attending: Internal Medicine | Admitting: Internal Medicine

## 2020-11-24 ENCOUNTER — Other Ambulatory Visit: Payer: Self-pay

## 2020-11-24 DIAGNOSIS — Z01812 Encounter for preprocedural laboratory examination: Secondary | ICD-10-CM | POA: Diagnosis not present

## 2020-11-24 DIAGNOSIS — Z20822 Contact with and (suspected) exposure to covid-19: Secondary | ICD-10-CM | POA: Diagnosis not present

## 2020-11-24 LAB — BASIC METABOLIC PANEL
Anion gap: 10 (ref 5–15)
BUN: 18 mg/dL (ref 8–23)
CO2: 27 mmol/L (ref 22–32)
Calcium: 9.5 mg/dL (ref 8.9–10.3)
Chloride: 102 mmol/L (ref 98–111)
Creatinine, Ser: 0.76 mg/dL (ref 0.44–1.00)
GFR, Estimated: >60 mL/min (ref >60)
Glucose, Bld: 98 mg/dL (ref 70–99)
Potassium: 3.7 mmol/L (ref 3.5–5.1)
Sodium: 139 mmol/L (ref 135–145)

## 2020-11-24 LAB — SARS CORONAVIRUS 2 (TAT 6-24 HRS): SARS Coronavirus 2: NEGATIVE

## 2020-11-27 ENCOUNTER — Encounter (HOSPITAL_COMMUNITY)
Admission: RE | Disposition: A | Discharge status: Home / Self Care | Payer: Self-pay | Source: Home / Self Care | Attending: Internal Medicine

## 2020-11-27 ENCOUNTER — Other Ambulatory Visit: Payer: Self-pay

## 2020-11-27 ENCOUNTER — Ambulatory Visit (HOSPITAL_COMMUNITY): Payer: Medicare PPO | Admitting: Anesthesiology

## 2020-11-27 ENCOUNTER — Ambulatory Visit (HOSPITAL_COMMUNITY)
Admission: RE | Admit: 2020-11-27 | Discharge: 2020-11-27 | Disposition: A | Discharge status: Home / Self Care | Payer: Medicare PPO | Attending: Internal Medicine | Admitting: Internal Medicine

## 2020-11-27 ENCOUNTER — Encounter (HOSPITAL_COMMUNITY): Payer: Self-pay

## 2020-11-27 DIAGNOSIS — Z800 Family history of malignant neoplasm of digestive organs: Secondary | ICD-10-CM | POA: Insufficient documentation

## 2020-11-27 DIAGNOSIS — Z8616 Personal history of COVID-19: Secondary | ICD-10-CM | POA: Insufficient documentation

## 2020-11-27 DIAGNOSIS — Z1211 Encounter for screening for malignant neoplasm of colon: Secondary | ICD-10-CM | POA: Diagnosis not present

## 2020-11-27 DIAGNOSIS — K5730 Diverticulosis of large intestine without perforation or abscess without bleeding: Secondary | ICD-10-CM | POA: Insufficient documentation

## 2020-11-27 DIAGNOSIS — D123 Benign neoplasm of transverse colon: Secondary | ICD-10-CM | POA: Insufficient documentation

## 2020-11-27 DIAGNOSIS — K635 Polyp of colon: Secondary | ICD-10-CM

## 2020-11-27 DIAGNOSIS — Z87891 Personal history of nicotine dependence: Secondary | ICD-10-CM | POA: Insufficient documentation

## 2020-11-27 DIAGNOSIS — K648 Other hemorrhoids: Secondary | ICD-10-CM | POA: Insufficient documentation

## 2020-11-27 DIAGNOSIS — Z86010 Personal history of colonic polyps: Secondary | ICD-10-CM

## 2020-11-27 DIAGNOSIS — I10 Essential (primary) hypertension: Secondary | ICD-10-CM | POA: Diagnosis not present

## 2020-11-27 DIAGNOSIS — E119 Type 2 diabetes mellitus without complications: Secondary | ICD-10-CM | POA: Diagnosis not present

## 2020-11-27 DIAGNOSIS — Z79899 Other long term (current) drug therapy: Secondary | ICD-10-CM | POA: Insufficient documentation

## 2020-11-27 DIAGNOSIS — Z7982 Long term (current) use of aspirin: Secondary | ICD-10-CM | POA: Insufficient documentation

## 2020-11-27 DIAGNOSIS — Z09 Encounter for follow-up examination after completed treatment for conditions other than malignant neoplasm: Secondary | ICD-10-CM | POA: Diagnosis not present

## 2020-11-27 HISTORY — DX: Sleep apnea, unspecified: G47.30

## 2020-11-27 HISTORY — PX: POLYPECTOMY: SHX5525

## 2020-11-27 HISTORY — PX: COLONOSCOPY WITH PROPOFOL: SHX5780

## 2020-11-27 SURGERY — COLONOSCOPY WITH PROPOFOL
Anesthesia: General

## 2020-11-27 MED ORDER — LACTATED RINGERS IV SOLN: INTRAVENOUS | Status: DC | PRN

## 2020-11-27 MED ORDER — CHLORHEXIDINE GLUCONATE CLOTH 2 % EX PADS: 6.0000 | MEDICATED_PAD | Freq: Once | CUTANEOUS | Status: DC

## 2020-11-27 MED ORDER — PROPOFOL 10 MG/ML IV BOLUS
INTRAVENOUS | Status: DC | PRN
  Administered 2020-11-27: 60 mg via INTRAVENOUS

## 2020-11-27 MED ORDER — PROPOFOL 500 MG/50ML IV EMUL
INTRAVENOUS | Status: DC | PRN
  Administered 2020-11-27: 125 ug/kg/min via INTRAVENOUS

## 2020-11-27 MED ORDER — LACTATED RINGERS IV SOLN: Freq: Once | INTRAVENOUS | Status: AC

## 2020-11-27 NOTE — Op Note (Signed)
[facility] Patient Name: Sophia [name] Procedure Date: 11/27/2020 7:31 AM MRN: [id number] Date of Birth: 10-26-1951 Attending MD: Elon Alas. Abbey Chatters DO CSN: [id number] Age: 70 Admit Type: Outpatient Procedure:                Colonoscopy Indications:              Surveillance: Personal history of adenomatous                            polyps on last colonoscopy > 5 years ago Providers:                Elon Alas. Abbey Chatters, DO, Janeece Riggers, RN, Raphael Gibney, Technician Referring MD:              Medicines:                See the Anesthesia note for documentation of the                            administered medications Complications:            No immediate complications. Estimated Blood Loss:     Estimated blood loss was minimal. Procedure:                Pre-Anesthesia Assessment:                           - The anesthesia plan was to use monitored                            anesthesia care (MAC).                           After obtaining informed consent, the colonoscope                            was passed under direct vision. Throughout the                            procedure, the patient's blood pressure, pulse, and                            oxygen saturations were monitored continuously. The                            [id number]-HQ190L ([id number]) scope was introduced through                            the anus and advanced to the the cecum, identified                            by appendiceal orifice and ileocecal valve. The                            colonoscopy was performed without  difficulty. The                            patient tolerated the procedure well. The quality                            of the bowel preparation was evaluated using the                            BBPS [facility] Bowel Preparation Scale) with scores                            of: Right Colon = 3, Transverse Colon = 3 and Left                            Colon = 3 (entire mucosa  seen well with no residual                            staining, small fragments of stool or opaque                            liquid). The total BBPS score equals 9. Scope In: 7:79:39 AM Scope Out: 7:59:40 AM Scope Withdrawal Time: 0 hours 7 minutes 16 seconds  Total Procedure Duration: 0 hours 12 minutes 52 seconds  Findings:      The perianal and digital rectal examinations were normal.      Non-bleeding internal hemorrhoids were found during endoscopy.      A 6 mm polyp was found in the transverse colon. The polyp was sessile.       The polyp was removed with a cold snare. Resection and retrieval were       complete.      Multiple small-mouthed diverticula were found in the sigmoid colon. Impression:               - Non-bleeding internal hemorrhoids.                           - One 6 mm polyp in the transverse colon, removed                            with a cold snare. Resected and retrieved.                           - Diverticulosis in the sigmoid colon. Moderate Sedation:      Per Anesthesia Care Recommendation:           - Patient has a contact number available for                            emergencies. The signs and symptoms of potential                            delayed complications were discussed with the  patient. Return to normal activities tomorrow.                            Written discharge instructions were provided to the                            patient.                           - Resume previous diet.                           - Continue present medications.                           - Await pathology results.                           - Repeat colonoscopy in 5 years for surveillance.                           - Return to GI clinic PRN. Procedure Code(s):        --- Professional ---                           [phone], Colonoscopy, flexible; with removal of                            tumor(s), polyp(s), or other lesion(s) by snare                             technique Diagnosis Code(s):        --- Professional ---                           Z86.010, Personal history of colonic polyps                           K63.5, Polyp of colon                           K64.8, Other hemorrhoids                           K57.30, Diverticulosis of large intestine without                            perforation or abscess without bleeding CPT copyright 2019 American Medical Association. All rights reserved. The codes documented in this report are preliminary and upon coder review may  be revised to meet current compliance requirements. Elon Alas. Abbey Chatters, DO Spencer Abbey Chatters, DO 11/27/2020 8:07:22 AM This report has been signed electronically. Number of Addenda: 0

## 2020-11-27 NOTE — Anesthesia Preprocedure Evaluation (Addendum)
Anesthesia Evaluation  Patient identified by MRN, date of birth, ID band Patient awake    Reviewed: Allergy & Precautions, NPO status , Patient's Chart, lab work & pertinent test results  History of Anesthesia Complications (+) PONV and history of anesthetic complications  Airway Mallampati: III  TM Distance: >3 FB Neck ROM: Full    Dental  (+) Dental Advisory Given, Upper Dentures   Pulmonary sleep apnea and Continuous Positive Airway Pressure Ventilation , pneumonia (covid in 2020), resolved, former smoker,    Pulmonary exam normal breath sounds clear to auscultation       Cardiovascular Exercise Tolerance: Good hypertension, Pt. on medications + DOE  Normal cardiovascular exam Rhythm:Regular Rate:Normal     Neuro/Psych PSYCHIATRIC DISORDERS Anxiety Depression    GI/Hepatic GERD  Medicated,(+)     substance abuse  marijuana use,   Endo/Other  diabetes (not on meds), Well Controlled, Type 2  Renal/GU      Musculoskeletal  (+) Arthritis  (back pain with radiation),   Abdominal   Peds  Hematology   Anesthesia Other Findings   Reproductive/Obstetrics                           Anesthesia Physical Anesthesia Plan  ASA: III  Anesthesia Plan: General   Post-op Pain Management:    Induction: Intravenous  PONV Risk Score and Plan:   Airway Management Planned: Nasal Cannula and Natural Airway  Additional Equipment:   Intra-op Plan:   Post-operative Plan:   Informed Consent: I have reviewed the patients History and Physical, chart, labs and discussed the procedure including the risks, benefits and alternatives for the proposed anesthesia with the patient or authorized representative who has indicated his/her understanding and acceptance.       Plan Discussed with: CRNA  Anesthesia Plan Comments:        Anesthesia Quick Evaluation

## 2020-11-27 NOTE — Anesthesia Postprocedure Evaluation (Signed)
Anesthesia Post Note  Patient: Sophia [name]  Procedure(s) Performed: COLONOSCOPY WITH PROPOFOL (N/A ) POLYPECTOMY  Patient location during evaluation: Phase II Anesthesia Type: General Level of consciousness: oriented and awake Pain management: satisfactory to patient Vital Signs Assessment: post-procedure vital signs reviewed and stable Respiratory status: spontaneous breathing Cardiovascular status: blood pressure returned to baseline and stable Postop Assessment: no apparent nausea or vomiting Anesthetic complications: no   No complications documented.   Last Vitals:  Vitals:   11/27/20 0653 11/27/20 0802  BP: (!) 151/73 (!) 97/50  Pulse: 76   Resp: 16 20  Temp: 36.8 C 36.5 C  SpO2: 95% 97%    Last Pain:  Vitals:   11/27/20 0802  TempSrc: Oral  PainSc: 0-No pain                 Karna Dupes

## 2020-11-27 NOTE — Transfer of Care (Signed)
Immediate Anesthesia Transfer of Care Note  Patient: Sophia [name]  Procedure(s) Performed: COLONOSCOPY WITH PROPOFOL (N/A ) POLYPECTOMY  Patient Location: PACU  Anesthesia Type:General  Level of Consciousness: awake and alert   Airway & Oxygen Therapy: Patient Spontanous Breathing  Post-op Assessment: Report given to RN and Post -op Vital signs reviewed and stable  Post vital signs: Reviewed and stable  Last Vitals:  Vitals Value Taken Time  BP 97/50   Temp    Pulse 60   Resp 20   SpO2 100     Last Pain:  Vitals:   11/27/20 0728  TempSrc:   PainSc: 0-No pain      Patients Stated Pain Goal: 10 (24/26/83 4196)  Complications: No complications documented.

## 2020-11-27 NOTE — Discharge Instructions (Addendum)
Colonoscopy Discharge Instructions  Read the instructions outlined below and refer to this sheet in the next few weeks. These discharge instructions provide you with general information on caring for yourself after you leave the hospital. Your doctor may also give you specific instructions. While your treatment has been planned according to the most current medical practices available, unavoidable complications occasionally occur.   ACTIVITY  You may resume your regular activity, but move at a slower pace for the next 24 hours.   Take frequent rest periods for the next 24 hours.   Walking will help get rid of the air and reduce the bloated feeling in your belly (abdomen).   No driving for 24 hours (because of the medicine (anesthesia) used during the test).    Do not sign any important legal documents or operate any machinery for 24 hours (because of the anesthesia used during the test).  NUTRITION  Drink plenty of fluids.   You may resume your normal diet as instructed by your doctor.   Begin with a light meal and progress to your normal diet. Heavy or fried foods are harder to digest and may make you feel sick to your stomach (nauseated).   Avoid alcoholic beverages for 24 hours or as instructed.  MEDICATIONS  You may resume your normal medications unless your doctor tells you otherwise.  WHAT YOU CAN EXPECT TODAY  Some feelings of bloating in the abdomen.   Passage of more gas than usual.   Spotting of blood in your stool or on the toilet paper.  IF YOU HAD POLYPS REMOVED DURING THE COLONOSCOPY:  No aspirin products for 7 days or as instructed.   No alcohol for 7 days or as instructed.   Eat a soft diet for the next 24 hours.  FINDING OUT THE RESULTS OF YOUR TEST Not all test results are available during your visit. If your test results are not back during the visit, make an appointment with your caregiver to find out the results. Do not assume everything is normal if  you have not heard from your caregiver or the medical facility. It is important for you to follow up on all of your test results.  SEEK IMMEDIATE MEDICAL ATTENTION IF:  You have more than a spotting of blood in your stool.   Your belly is swollen (abdominal distention).   You are nauseated or vomiting.   You have a temperature over 101.   You have abdominal pain or discomfort that is severe or gets worse throughout the day.   Your colonoscopy revealed 1 polyp(s) which I removed successfully. Await pathology results, my office will contact you. I recommend repeating colonoscopy in 5 years for surveillance purposes. You also have diverticulosis and internal hemorrhoids. I would recommend increasing fiber in your diet or adding OTC Benefiber/Metamucil. Be sure to drink at least 4 to 6 glasses of water daily. Follow-up with GI as needed.    I hope you have a great rest of your week!  Sophia [name]. Abbey Chatters, D.O. Gastroenterology and Hepatology [facility]    Diverticulosis  Diverticulosis is a condition that develops when small pouches (diverticula) form in the wall of the large intestine (colon). The colon is where water is absorbed and stool (feces) is formed. The pouches form when the inside layer of the colon pushes through weak spots in the outer layers of the colon. You may have a few pouches or many of them. The pouches usually do not cause problems unless  they become inflamed or infected. When this happens, the condition is called diverticulitis. What are the causes? The cause of this condition is not known. What increases the risk? The following factors may make you more likely to develop this condition:  Being older than age 43. Your risk for this condition increases with age. Diverticulosis is rare among people younger than age 45. By age 55, many people have it.  Eating a low-fiber diet.  Having frequent constipation.  Being overweight.  Not  getting enough exercise.  Smoking.  Taking over-the-counter pain medicines, like aspirin and ibuprofen.  Having a family history of diverticulosis. What are the signs or symptoms? In most people, there are no symptoms of this condition. If you do have symptoms, they may include:  Bloating.  Cramps in the abdomen.  Constipation or diarrhea.  Pain in the lower left side of the abdomen. How is this diagnosed? Because diverticulosis usually has no symptoms, it is most often diagnosed during an exam for other colon problems. The condition may be diagnosed by:  Using a flexible scope to examine the colon (colonoscopy).  Taking an X-ray of the colon after dye has been put into the colon (barium enema).  Having a CT scan. How is this treated? You may not need treatment for this condition. Your health care provider may recommend treatment to prevent problems. You may need treatment if you have symptoms or if you previously had diverticulitis. Treatment may include:  Eating a high-fiber diet.  Taking a fiber supplement.  Taking a live bacteria supplement (probiotic).  Taking medicine to relax your colon.   Follow these instructions at home: Medicines  Take over-the-counter and prescription medicines only as told by your health care provider.  If told by your health care provider, take a fiber supplement or probiotic. Constipation prevention Your condition may cause constipation. To prevent or treat constipation, you may need to:  Drink enough fluid to keep your urine pale yellow.  Take over-the-counter or prescription medicines.  Eat foods that are high in fiber, such as beans, whole grains, and fresh fruits and vegetables.  Limit foods that are high in fat and processed sugars, such as fried or sweet foods.   General instructions  Try not to strain when you have a bowel movement.  Keep all follow-up visits as told by your health care provider. This is important. Contact  a health care provider if you:  Have pain in your abdomen.  Have bloating.  Have cramps.  Have not had a bowel movement in 3 days. Get help right away if:  Your pain gets worse.  Your bloating becomes very bad.  You have a fever or chills, and your symptoms suddenly get worse.  You vomit.  You have bowel movements that are bloody or black.  You have bleeding from your rectum. Summary  Diverticulosis is a condition that develops when small pouches (diverticula) form in the wall of the large intestine (colon).  You may have a few pouches or many of them.  This condition is most often diagnosed during an exam for other colon problems.  Treatment may include increasing the fiber in your diet, taking supplements, or taking medicines. This information is not intended to replace advice given to you by your health care provider. Make sure you discuss any questions you have with your health care provider. Document Revised: 06/03/2019 Document Reviewed: 06/03/2019 Elsevier Patient Education  2021 [place].  Hemorrhoids Hemorrhoids are swollen veins in and around the  rectum or anus. There are two types of hemorrhoids:  Internal hemorrhoids. These occur in the veins that are just inside the rectum. They may poke through to the outside and become irritated and painful.  External hemorrhoids. These occur in the veins that are outside the anus and can be felt as a painful swelling or hard lump near the anus. Most hemorrhoids do not cause serious problems, and they can be managed with home treatments such as diet and lifestyle changes. If home treatments do not help the symptoms, procedures can be done to shrink or remove the hemorrhoids. What are the causes? This condition is caused by increased pressure in the anal area. This pressure may result from various things, including:  Constipation.  Straining to have a bowel movement.  Diarrhea.  Pregnancy.  Obesity.  Sitting  for long periods of time.  Heavy lifting or other activity that causes you to strain.  Anal sex.  Riding a bike for a long period of time. What are the signs or symptoms? Symptoms of this condition include:  Pain.  Anal itching or irritation.  Rectal bleeding.  Leakage of stool (feces).  Anal swelling.  One or more lumps around the anus. How is this diagnosed? This condition can often be diagnosed through a visual exam. Other exams or tests may also be done, such as:  An exam that involves feeling the rectal area with a gloved hand (digital rectal exam).  An exam of the anal canal that is done using a small tube (anoscope).  A blood test, if you have lost a significant amount of blood.  A test to look inside the colon using a flexible tube with a camera on the end (sigmoidoscopy or colonoscopy). How is this treated? This condition can usually be treated at home. However, various procedures may be done if dietary changes, lifestyle changes, and other home treatments do not help your symptoms. These procedures can help make the hemorrhoids smaller or remove them completely. Some of these procedures involve surgery, and others do not. Common procedures include:  Rubber band ligation. Rubber bands are placed at the base of the hemorrhoids to cut off their blood supply.  Sclerotherapy. Medicine is injected into the hemorrhoids to shrink them.  Infrared coagulation. A type of light energy is used to get rid of the hemorrhoids.  Hemorrhoidectomy surgery. The hemorrhoids are surgically removed, and the veins that supply them are tied off.  Stapled hemorrhoidopexy surgery. The surgeon staples the base of the hemorrhoid to the rectal wall. Follow these instructions at home: Eating and drinking  Eat foods that have a lot of fiber in them, such as whole grains, beans, nuts, fruits, and vegetables.  Ask your health care provider about taking products that have added fiber (fiber  supplements).  Reduce the amount of fat in your diet. You can do this by eating low-fat dairy products, eating less red meat, and avoiding processed foods.  Drink enough fluid to keep your urine pale yellow.   Managing pain and swelling  Take warm sitz baths for 20 minutes, 3-4 times a day to ease pain and discomfort. You may do this in a bathtub or using a portable sitz bath that fits over the toilet.  If directed, apply ice to the affected area. Using ice packs between sitz baths may be helpful. ? Put ice in a plastic bag. ? Place a towel between your skin and the bag. ? Leave the ice on for 20 minutes, 2-3 times  a day.   General instructions  Take over-the-counter and prescription medicines only as told by your health care provider.  Use medicated creams or suppositories as told.  Get regular exercise. Ask your health care provider how much and what kind of exercise is best for you. In general, you should do moderate exercise for at least 30 minutes on most days of the week (150 minutes each week). This can include activities such as walking, biking, or yoga.  Go to the bathroom when you have the urge to have a bowel movement. Do not wait.  Avoid straining to have bowel movements.  Keep the anal area dry and clean. Use wet toilet paper or moist towelettes after a bowel movement.  Do not sit on the toilet for long periods of time. This increases blood pooling and pain.  Keep all follow-up visits as told by your health care provider. This is important. Contact a health care provider if you have:  Increasing pain and swelling that are not controlled by treatment or medicine.  Difficulty having a bowel movement, or you are unable to have a bowel movement.  Pain or inflammation outside the area of the hemorrhoids. Get help right away if you have:  Uncontrolled bleeding from your rectum. Summary  Hemorrhoids are swollen veins in and around the rectum or anus.  Most  hemorrhoids can be managed with home treatments such as diet and lifestyle changes.  Taking warm sitz baths can help ease pain and discomfort.  In severe cases, procedures or surgery can be done to shrink or remove the hemorrhoids. This information is not intended to replace advice given to you by your health care provider. Make sure you discuss any questions you have with your health care provider. Document Revised: 04/02/2019 Document Reviewed: 03/26/2018 Elsevier Patient Education  [place].  Colon Polyps  Colon polyps are tissue growths inside the colon, which is part of the large intestine. They are one of the types of polyps that can grow in the body. A polyp may be a round bump or a mushroom-shaped growth. You could have one polyp or more than one. Most colon polyps are noncancerous (benign). However, some colon polyps can become cancerous over time. Finding and removing the polyps early can help prevent this. What are the causes? The exact cause of colon polyps is not known. What increases the risk? The following factors may make you more likely to develop this condition:  Having a family history of colorectal cancer or colon polyps.  Being older than 71 years of age.  Being younger than 71 years of age and having a significant family history of colorectal cancer or colon polyps or a genetic condition that puts you at higher risk of getting colon polyps.  Having inflammatory bowel disease, such as ulcerative colitis or Crohn's disease.  Having certain conditions passed from parent to child (hereditary conditions), such as: ? Familial adenomatous polyposis (FAP). ? Lynch syndrome. ? Turcot syndrome. ? Peutz-Jeghers syndrome. ? MUTYH-associated polyposis (MAP).  Being overweight.  Certain lifestyle factors. These include smoking cigarettes, drinking too much alcohol, not getting enough exercise, and eating a diet that is high in fat and red meat and low in  fiber.  Having had childhood cancer that was treated with radiation of the abdomen. What are the signs or symptoms? Many times, there are no symptoms. If you have symptoms, they may include:  Blood coming from the rectum during a bowel movement.  Blood in the stool (  feces). The blood may be bright red or very dark in color.  Pain in the abdomen.  A change in bowel habits, such as constipation or diarrhea. How is this diagnosed? This condition is diagnosed with a colonoscopy. This is a procedure in which a lighted, flexible scope is inserted into the opening between the buttocks (anus) and then passed into the colon to examine the area. Polyps are sometimes found when a colonoscopy is done as part of routine cancer screening tests. How is this treated? This condition is treated by removing any polyps that are found. Most polyps can be removed during a colonoscopy. Those polyps will then be tested for cancer. Additional treatment may be needed depending on the results of testing. Follow these instructions at home: Eating and drinking  Eat foods that are high in fiber, such as fruits, vegetables, and whole grains.  Eat foods that are high in calcium and vitamin D, such as milk, cheese, yogurt, eggs, liver, fish, and broccoli.  Limit foods that are high in fat, such as fried foods and desserts.  Limit the amount of red meat, precooked or cured meat, or other processed meat that you eat, such as hot dogs, sausages, bacon, or meat loaves.  Limit sugary drinks.   Lifestyle  Maintain a healthy weight, or lose weight if recommended by your health care provider.  Exercise every day or as told by your health care provider.  Do not use any products that contain nicotine or tobacco, such as cigarettes, e-cigarettes, and chewing tobacco. If you need help quitting, ask your health care provider.  Do not drink alcohol if: ? Your health care provider tells you not to drink. ? You are pregnant,  may be pregnant, or are planning to become pregnant.  If you drink alcohol: ? Limit how much you use to:  0-1 drink a day for women.  0-2 drinks a day for men. ? Know how much alcohol is in your drink. In the [place] one drink equals one 12 oz bottle of beer (355 mL), one 5 oz glass of wine (148 mL), or one 1 oz glass of hard liquor (44 mL). General instructions  Take over-the-counter and prescription medicines only as told by your health care provider.  Keep all follow-up visits. This is important. This includes having regularly scheduled colonoscopies. Talk to your health care provider about when you need a colonoscopy. Contact a health care provider if:  You have new or worsening bleeding during a bowel movement.  You have new or increased blood in your stool.  You have a change in bowel habits.  You lose weight for no known reason. Summary  Colon polyps are tissue growths inside the colon, which is part of the large intestine. They are one type of polyp that can grow in the body.  Most colon polyps are noncancerous (benign), but some can become cancerous over time.  This condition is diagnosed with a colonoscopy.  This condition is treated by removing any polyps that are found. Most polyps can be removed during a colonoscopy. This information is not intended to replace advice given to you by your health care provider. Make sure you discuss any questions you have with your health care provider. Document Revised: 02/23/2020 Document Reviewed: 02/23/2020 Elsevier Patient Education  2021 [employer].

## 2020-11-27 NOTE — H&P (Signed)
Primary Care Physician:  Fayrene Helper, MD Primary Gastroenterologist:  Dr. Abbey Chatters  Pre-Procedure History & Physical: HPI:  Sophia [name] is a 70 y.o. adult is here for a colonoscopy to be performed for surveillance purposes due to personal history of adenomatous colon polyps. Last CLN 2015 with 2 polyps removed. .  Notes an aunt with history of colorectal cancer.  No melena or hematochezia.  No abdominal pain or unintentional weight loss.  No change in bowel habits.  Overall feels well from a GI standpoint.  Past Medical History:  Diagnosis Date  . Acute respiratory disease due to COVID-19 virus 08/28/2019  . Anxiety   . Arthritis   . Depression   . Diabetes mellitus type 2 in obese [facility]) 11/28/2010   Qualifier: Diagnosis of  By: Moshe Cipro MD, Margaret  Diet controlled in 12/2016   . Diabetes mellitus without complication ([place])   . GERD (gastroesophageal reflux disease)   . Hypercholesteremia   . Hypertension   . PONV (postoperative nausea and vomiting)   . Sleep apnea     Past Surgical History:  Procedure Laterality Date  . ABDOMINAL HYSTERECTOMY  1982   tubes and womb, bleeding and ectopic  . ABDOMINAL SURGERY     removal of tumors  . BREAST SURGERY Left 1983   benign tumor  . CATARACT EXTRACTION W/PHACO Right 01/11/2014   Procedure: CATARACT EXTRACTION PHACO AND INTRAOCULAR LENS PLACEMENT (IOC);  Surgeon: Elta Guadeloupe T. Gershon Crane, MD;  Location: [facility];  Service: Ophthalmology;  Laterality: Right;  CDE 5.57  . CATARACT EXTRACTION W/PHACO Left 01/25/2014   Procedure: CATARACT EXTRACTION PHACO AND INTRAOCULAR LENS PLACEMENT (IOC);  Surgeon: Elta Guadeloupe T. Gershon Crane, MD;  Location: [facility];  Service: Ophthalmology;  Laterality: Left;  CDE:5.19  . COLONOSCOPY  2007 [facility]   NL EXAM  . COLONOSCOPY N/A 04/22/2014   Dr. Fields:Normal mucosa in the terminal ileum/Two COLON polyps REMOVED/ Mild diverticulosis in the ascending colon and transverse colon/The LEFT colon IS redundant/Small internal  hemorrhoids. Path: tubular adenoma. Next colonoscopy in 5-10 years  . ESOPHAGOGASTRODUODENOSCOPY N/A 04/22/2014   Dr. Fields:MICROCYTIC ANEMIA MOST LIKELY DUE TO ASA/VOLTAREN/Small hiatal hernia/MODERATE Non-erosive gastritis. Negative H.pylori  . LEFT HEART CATH AND CORONARY ANGIOGRAPHY N/A 06/23/2018   Procedure: LEFT HEART CATH AND CORONARY ANGIOGRAPHY;  Surgeon: Jettie Booze, MD;  Location: [place] [facility];  Service: Cardiovascular;  Laterality: N/A;  . 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 ULTRASOUND GUIDANCE FOR VASCULAR ACCESS  06/23/2018   Procedure: Ultrasound Guidance For Vascular Access;  Surgeon: Jettie Booze, MD;  Location: [place] [facility];  Service: Cardiovascular;;  . UPPER GASTROINTESTINAL ENDOSCOPY  2007 CHEST PAIN   NL EXAM  . YAG LASER APPLICATION Right 01/31/1760   Procedure: YAG LASER APPLICATION;  Surgeon: Elta Guadeloupe T. Gershon Crane, MD;  Location: [facility];  Service: Ophthalmology;  Laterality: Right;  . YAG LASER APPLICATION Left 04/24/3709   Procedure: YAG LASER APPLICATION;  Surgeon: Rutherford Guys, MD;  Location: [facility];  Service: Ophthalmology;  Laterality: Left;    Prior to Admission medications   Medication Sig Start Date End Date Taking? Authorizing Provider  amLODipine (NORVASC) 10 MG tablet TAKE 1 TABLET EVERY DAY Patient taking differently: Take 10 mg by mouth daily. 08/11/20  Yes Fayrene Helper, MD  Ascorbic Acid (VITAMIN C) 500 MG CAPS Take 500 mg by mouth daily.    Yes [provider]  aspirin EC 325 MG tablet Take 325 mg by mouth daily.  Yes [provider]  benazepril-hydrochlorthiazide (LOTENSIN HCT) 20-12.5 MG tablet TAKE 2 TABLETS EVERY DAY Patient taking differently: Take 2 tablets by mouth daily. 08/11/20  Yes Kerri Perches, MD  Black Currant Seed Oil 500 MG CAPS Take 500 mg by mouth daily. 5 ml liquid   Yes [provider]  busPIRone (BUSPAR) 7.5 MG tablet Take 1 tablet (7.5 mg total) by mouth 3  (three) times daily. 12/07/19  Yes Kerri Perches, MD  Cholecalciferol (VITAMIN D3) 250 MCG (10000 UT) capsule Take 10,000 Units by mouth daily.   Yes [provider]  FLUoxetine (PROZAC) 40 MG capsule TAKE 1 CAPSULE EVERY DAY Patient taking differently: Take 40 mg by mouth daily. 08/11/20  Yes Kerri Perches, MD  gabapentin (NEURONTIN) 100 MG capsule TAKE 1 CAPSULE THREE TIMES DAILY Patient taking differently: Take 100 mg by mouth 3 (three) times daily. 08/11/20  Yes Kerri Perches, MD  Krill Oil (OMEGA-3) 500 MG CAPS Take 500 mg by mouth daily.    Yes [provider]  Multiple Vitamin (MULTIVITAMIN WITH MINERALS) TABS tablet Take 1 tablet by mouth daily.   Yes [provider]  pantoprazole (PROTONIX) 40 MG tablet TAKE 1 TABLET EVERY DAY Patient taking differently: Take 40 mg by mouth daily. 04/21/20  Yes Kerri Perches, MD  polyethylene glycol (MIRALAX / GLYCOLAX) 17 g packet Take 17 g by mouth daily as needed for mild constipation or moderate constipation.   Yes [provider]  pravastatin (PRAVACHOL) 40 MG tablet TAKE 1 TABLET EVERY EVENING. Patient taking differently: Take 40 mg by mouth at bedtime. 08/11/20  Yes Kerri Perches, MD  alprazolam Prudy Feeler) 2 MG tablet Take one tablet by mouth three times weekly, as needed, for uncontrolled anxiety and panic Patient taking differently: Take 2 mg by mouth See admin instructions. Take 2 mg  by mouth three times weekly, as needed, for uncontrolled anxiety and panic 06/08/20   Kerri Perches, MD    Allergies as of 09/21/2020  . (No Known Allergies)    Family History  Problem Relation Age of Onset  . Colon polyps Sister 21  . Diabetes Sister   . Hypertension Sister   . Kidney disease Sister   . Arthritis Father   . Cancer Father        prostate   . Hypertension Father   . Diabetes Sister   . Hypertension Sister   . CAD Paternal Grandmother   . Colon cancer Neg Hx     Social  History   Socioeconomic History  . Marital status: Married    Spouse name: Not on file  . Number of children: Not on file  . Years of education: Not on file  . Highest education level: Not on file  Occupational History  . Not on file  Tobacco Use  . Smoking status: Former Smoker    Packs/day: 0.25    Years: 20.00    Pack years: 5.00    Types: Cigarettes    Quit date: 01/05/1990    Years since quitting: 30.9  . Smokeless tobacco: Current User    Types: Snuff  Vaping Use  . Vaping Use: Never used  Substance and Sexual Activity  . Alcohol use: No  . Drug use: Yes    Types: Marijuana  . Sexual activity: Not Currently    Birth control/protection: Surgical  Other Topics Concern  . Not on file  Social History Narrative  . Not on file  Social Determinants of Health   Financial Resource Strain: Low Risk   . Difficulty of Paying Living Expenses: Not hard at all  Food Insecurity: No Food Insecurity  . Worried About Charity fundraiser in the Last Year: Never true  . Ran Out of Food in the Last Year: Never true  Transportation Needs: No Transportation Needs  . Lack of Transportation (Medical): No  . Lack of Transportation (Non-Medical): No  Physical Activity: Insufficiently Active  . Days of Exercise per Week: 3 days  . Minutes of Exercise per Session: 30 min  Stress: No Stress Concern Present  . Feeling of Stress : Only a little  Social Connections: Moderately Integrated  . Frequency of Communication with Friends and Family: More than three times a week  . Frequency of Social Gatherings with Friends and Family: Twice a week  . Attends Religious Services: 1 to 4 times per year  . Active Member of Clubs or Organizations: No  . Attends Archivist Meetings: Never  . Marital Status: Married  Human resources officer Violence: Not At Risk  . Fear of Current or Ex-Partner: No  . Emotionally Abused: No  . Physically Abused: No  . Sexually Abused: No    Review of  Systems: See HPI, otherwise negative ROS  Impression/Plan: MARQUETTA WEISKOPF is here for a colonoscopy to be performed for surveillance purposes due to personal history of adenomatous colon polyps.  The risks of the procedure including infection, bleed, or perforation as well as benefits, limitations, alternatives and imponderables have been reviewed with the patient. Questions have been answered. All parties agreeable.

## 2020-11-28 LAB — SURGICAL PATHOLOGY

## 2020-11-29 ENCOUNTER — Encounter (HOSPITAL_COMMUNITY): Payer: Self-pay | Admitting: Internal Medicine

## 2020-11-30 DIAGNOSIS — E669 Obesity, unspecified: Secondary | ICD-10-CM | POA: Diagnosis not present

## 2020-11-30 DIAGNOSIS — E1169 Type 2 diabetes mellitus with other specified complication: Secondary | ICD-10-CM | POA: Diagnosis not present

## 2020-11-30 DIAGNOSIS — I10 Essential (primary) hypertension: Secondary | ICD-10-CM | POA: Diagnosis not present

## 2020-11-30 DIAGNOSIS — R002 Palpitations: Secondary | ICD-10-CM | POA: Diagnosis not present

## 2020-11-30 NOTE — Progress Notes (Signed)
Dear Sophia [name],  Your pathology report from the colonoscopy showed that the polyp(s) removed were tubular adenomas. (non-cancerous). Dr. Abbey Chatters wants to repeat your colonoscopy in 5 years.   Please follow-up with [place] as needed.   Thank you, Floria Raveling, CMA

## 2020-12-01 ENCOUNTER — Other Ambulatory Visit: Payer: Self-pay | Admitting: Family Medicine

## 2020-12-01 LAB — CBC
Hematocrit: 38.7 % (ref 34.0–46.6)
Hemoglobin: 12.3 g/dL (ref 11.1–15.9)
MCH: 26.6 pg (ref 26.6–33.0)
MCHC: 31.8 g/dL (ref 31.5–35.7)
MCV: 84 fL (ref 79–97)
Platelets: 232 10*3/uL (ref 150–450)
RBC: 4.62 x10E6/uL (ref 3.77–5.28)
RDW: 15.2 % (ref 11.7–15.4)
WBC: 5.1 10*3/uL (ref 3.4–10.8)

## 2020-12-01 LAB — BMP8+EGFR
BUN/Creatinine Ratio: 19 (ref 12–28)
BUN: 16 mg/dL (ref 8–27)
CO2: 25 mmol/L (ref 20–29)
Calcium: 10 mg/dL (ref 8.7–10.3)
Chloride: 103 mmol/L (ref 96–106)
Creatinine, Ser: 0.86 mg/dL (ref 0.57–1.00)
GFR calc Af Amer: 79 mL/min/{1.73_m2} (ref >59)
GFR calc non Af Amer: 69 mL/min/{1.73_m2} (ref >59)
Glucose: 104 mg/dL — ABNORMAL HIGH (ref 65–99)
Potassium: 3.9 mmol/L (ref 3.5–5.2)
Sodium: 143 mmol/L (ref 134–144)

## 2020-12-01 LAB — HEMOGLOBIN A1C
Est. average glucose Bld gHb Est-mCnc: 120 mg/dL
Hgb A1c MFr Bld: 5.8 % — ABNORMAL HIGH (ref 4.8–5.6)

## 2020-12-04 ENCOUNTER — Other Ambulatory Visit: Payer: Self-pay | Admitting: Family Medicine

## 2020-12-05 ENCOUNTER — Telehealth (INDEPENDENT_AMBULATORY_CARE_PROVIDER_SITE_OTHER): Payer: Medicare PPO | Admitting: Family Medicine

## 2020-12-05 ENCOUNTER — Encounter: Payer: Self-pay | Admitting: Family Medicine

## 2020-12-05 ENCOUNTER — Other Ambulatory Visit: Payer: Self-pay

## 2020-12-05 VITALS — BP 162/73 | Ht 69.0 in | Wt 187.0 lb

## 2020-12-05 DIAGNOSIS — M549 Dorsalgia, unspecified: Secondary | ICD-10-CM

## 2020-12-05 DIAGNOSIS — F4321 Adjustment disorder with depressed mood: Secondary | ICD-10-CM | POA: Diagnosis not present

## 2020-12-05 DIAGNOSIS — R7303 Prediabetes: Secondary | ICD-10-CM | POA: Diagnosis not present

## 2020-12-05 DIAGNOSIS — I10 Essential (primary) hypertension: Secondary | ICD-10-CM | POA: Diagnosis not present

## 2020-12-05 DIAGNOSIS — F410 Panic disorder [episodic paroxysmal anxiety] without agoraphobia: Secondary | ICD-10-CM

## 2020-12-05 DIAGNOSIS — F322 Major depressive disorder, single episode, severe without psychotic features: Secondary | ICD-10-CM | POA: Diagnosis not present

## 2020-12-05 MED ORDER — BUPROPION HCL ER (XL) 150 MG PO TB24: 150.0000 mg | ORAL_TABLET | Freq: Every day | ORAL | 2 refills | Status: DC

## 2020-12-05 NOTE — Patient Instructions (Addendum)
F/U in office with MD to re evaluate blood pressure, depression and anxiety in 5 weeks, call if you need me sooner  New additional medication for depression is Wellbutrin 150 mg one daily  You are referred to Therapist, P Bynum , for help with depression and grief  Blood sugar is nearly normal, cONGRATS, kidney and liver function are both normal, GREAT!  Thanks for choosing [facility], we consider it a privelige to serve you.

## 2020-12-05 NOTE — Progress Notes (Signed)
Virtual Visit via Telephone Note  I connected with Coolidge Breeze on 12/05/20 at  9:20 AM EST by telephone and verified that I am speaking with the correct person using two identifiers.  Location: Patient: home Provider: work   I discussed the limitations, risks, security and privacy concerns of performing an evaluation and management service by telephone and the availability of in person appointments. I also discussed with the patient that there may be a patient responsible charge related to this service. The patient expressed understanding and agreed to proceed.   History of Present Illness:   f/U chronic problems Doing well BUT too often I feel very sad, depressed , nothing is the same since I lost my brother in 07/2020 Denies recent fever or chills. Denies sinus pressure, nasal congestion, ear pain or sore throat. Denies chest congestion, productive cough or wheezing. Denies chest pains, palpitations and leg swelling Denies abdominal pain, nausea, vomiting,diarrhea or constipation.   Denies dysuria, frequency, hesitancy or incontinence. Denies uncontrolled  joint pain, swelling and limitation in mobility.Occasional back ache depending on the day's activities Denies headaches, seizures, numbness, or tingling.  Denies skin break down or rash.     Observations/Objective: BP (!) 162/73   Ht 5\' 9"  (1.753 m)   Wt 187 lb (84.8 kg)   BMI 27.62 kg/m  Good communication with no confusion and intact memory. Alert and oriented x 3 No signs of respiratory distress during speech    Assessment and Plan: Essential hypertension Uncontrolled, needs OV DASH diet and commitment to daily physical activity for a minimum of 30 minutes discussed and encouraged, as a part of hypertension management. The importance of attaining a healthy weight is also discussed.  BP/Weight 12/05/2020 11/27/2020 09/19/2020 09/12/2020 07/26/2020 07/17/2020 04/24/3709  Systolic BP 626 948 - 546 - 270 350  Diastolic  BP 73 57 - 76 - 68 72  Wt. (Lbs) 187 187 186.8 191 191.6 194 197  BMI 27.62 27.62 27.59 28.21 28.29 28.65 29.09       Depression, major, single episode, complete remission (HCC) Not adequately treated, add welbutrin and refer to therapy, re eval in 5 weeks  Back pain with radiation Denies any current or recent flare  Prediabetes Patient educated about the importance of limiting  Carbohydrate intake , the need to commit to daily physical activity for a minimum of 30 minutes , and to commit weight loss. The fact that changes in all these areas will reduce or eliminate all together the development of diabetes is stressed.  Excellent, reanding towards normal blood sugar   Diabetic Labs Latest Ref Rng & Units 11/30/2020 11/24/2020 09/01/2020 06/08/2020 06/08/2020  HbA1c 4.8 - 5.6 % 5.8(H) - - 6.0 6.0  Microalbumin Not Estab. ug/mL - - - 98.9(H) -  Micro/Creat Ratio 0 - 29 mg/g creat - - - 71(H) -  Chol <200 mg/dL - - 185 - -  HDL > OR = 50 mg/dL - - 66 - -  Calc LDL mg/dL (calc) - - 97 - -  Triglycerides <150 mg/dL - - 122 - -  Creatinine 0.57 - 1.00 mg/dL 0.86 0.76 0.97(H) - -   BP/Weight 12/05/2020 11/27/2020 09/19/2020 09/12/2020 07/26/2020 07/17/2020 0/93/8182  Systolic BP 993 716 - 967 - 893 810  Diastolic BP 73 57 - 76 - 68 72  Wt. (Lbs) 187 187 186.8 191 191.6 194 197  BMI 27.62 27.62 27.59 28.21 28.29 28.65 29.09   Foot/eye exam completion dates Latest Ref Rng & Units 09/12/2020 01/24/2020  Eye Exam No Retinopathy - No Retinopathy  Foot Form Completion - Done -      Panic anxiety syndrome Continue current dose of xanax, advised against driving when she takes medication, referring to therapy , will work on behavior modification    Follow Up Instructions:    I discussed the assessment and treatment plan with the patient. The patient was provided an opportunity to ask questions and all were answered. The patient agreed with the plan and demonstrated an understanding of the  instructions.   The patient was advised to call back or seek an in-person evaluation if the symptoms worsen or if the condition fails to improve as anticipated.  I provided 30 minutes of non-face-to-face time during this encounter.   Tula Nakayama, MD

## 2020-12-06 ENCOUNTER — Telehealth: Payer: Self-pay | Admitting: Family Medicine

## 2020-12-06 ENCOUNTER — Other Ambulatory Visit: Payer: Self-pay | Admitting: Nurse Practitioner

## 2020-12-06 ENCOUNTER — Encounter: Payer: Self-pay | Admitting: Family Medicine

## 2020-12-06 DIAGNOSIS — F322 Major depressive disorder, single episode, severe without psychotic features: Secondary | ICD-10-CM

## 2020-12-06 DIAGNOSIS — F410 Panic disorder [episodic paroxysmal anxiety] without agoraphobia: Secondary | ICD-10-CM | POA: Insufficient documentation

## 2020-12-06 MED ORDER — ALPRAZOLAM 2 MG PO TABS: ORAL_TABLET | ORAL | 1 refills | Status: DC

## 2020-12-06 NOTE — Assessment & Plan Note (Signed)
Uncontrolled, needs OV DASH diet and commitment to daily physical activity for a minimum of 30 minutes discussed and encouraged, as a part of hypertension management. The importance of attaining a healthy weight is also discussed.  BP/Weight 12/05/2020 11/27/2020 09/19/2020 09/12/2020 07/26/2020 07/17/2020 07/12/538  Systolic BP 767 341 - 937 - 902 409  Diastolic BP 73 57 - 76 - 68 72  Wt. (Lbs) 187 187 186.8 191 191.6 194 197  BMI 27.62 27.62 27.59 28.21 28.29 28.65 29.09

## 2020-12-06 NOTE — Progress Notes (Signed)
On recall  °

## 2020-12-06 NOTE — Assessment & Plan Note (Signed)
Continue current dose of xanax, advised against driving when she takes medication, referring to therapy , will work on behavior modification

## 2020-12-06 NOTE — Assessment & Plan Note (Signed)
Patient educated about the importance of limiting  Carbohydrate intake , the need to commit to daily physical activity for a minimum of 30 minutes , and to commit weight loss. The fact that changes in all these areas will reduce or eliminate all together the development of diabetes is stressed.  Excellent, reanding towards normal blood sugar   Diabetic Labs Latest Ref Rng & Units 11/30/2020 11/24/2020 09/01/2020 06/08/2020 06/08/2020  HbA1c 4.8 - 5.6 % 5.8(H) - - 6.0 6.0  Microalbumin Not Estab. ug/mL - - - 98.9(H) -  Micro/Creat Ratio 0 - 29 mg/g creat - - - 71(H) -  Chol <200 mg/dL - - 185 - -  HDL > OR = 50 mg/dL - - 66 - -  Calc LDL mg/dL (calc) - - 97 - -  Triglycerides <150 mg/dL - - 122 - -  Creatinine 0.57 - 1.00 mg/dL 0.86 0.76 0.97(H) - -   BP/Weight 12/05/2020 11/27/2020 09/19/2020 09/12/2020 07/26/2020 07/17/2020 07/16/5620  Systolic BP 308 657 - 846 - 962 952  Diastolic BP 73 57 - 76 - 68 72  Wt. (Lbs) 187 187 186.8 191 191.6 194 197  BMI 27.62 27.62 27.59 28.21 28.29 28.65 29.09   Foot/eye exam completion dates Latest Ref Rng & Units 09/12/2020 01/24/2020  Eye Exam No Retinopathy - No Retinopathy  Foot Form Completion - Done -

## 2020-12-06 NOTE — Assessment & Plan Note (Signed)
Denies any current or recent flare

## 2020-12-06 NOTE — Assessment & Plan Note (Signed)
Not adequately treated, add welbutrin and refer to therapy, re eval in 5 weeks

## 2020-12-09 ENCOUNTER — Other Ambulatory Visit: Payer: Self-pay | Admitting: Family Medicine

## 2020-12-13 ENCOUNTER — Ambulatory Visit (HOSPITAL_COMMUNITY): Payer: Self-pay | Admitting: Psychiatry

## 2020-12-18 ENCOUNTER — Other Ambulatory Visit: Payer: Self-pay | Admitting: Family Medicine

## 2020-12-18 DIAGNOSIS — F321 Major depressive disorder, single episode, moderate: Secondary | ICD-10-CM

## 2020-12-18 DIAGNOSIS — F419 Anxiety disorder, unspecified: Secondary | ICD-10-CM

## 2020-12-18 NOTE — Telephone Encounter (Signed)
Referral sent 

## 2020-12-21 ENCOUNTER — Other Ambulatory Visit: Payer: Self-pay | Admitting: Family Medicine

## 2020-12-21 ENCOUNTER — Telehealth: Payer: Self-pay

## 2020-12-21 MED ORDER — OMEPRAZOLE 40 MG PO CPDR: 40.0000 mg | DELAYED_RELEASE_CAPSULE | Freq: Every day | ORAL | 3 refills | Status: DC

## 2020-12-21 NOTE — Telephone Encounter (Signed)
Had a recent visit and following up in early March but wants to know if she can try something else for the GERD because the protonix isn't working. Wants it sent to [facility] and delivered

## 2020-12-21 NOTE — Telephone Encounter (Signed)
I sent omeprazole, if not covered please let me know what is, thanks

## 2020-12-24 ENCOUNTER — Emergency Department (HOSPITAL_COMMUNITY): Payer: Medicare PPO

## 2020-12-24 ENCOUNTER — Other Ambulatory Visit: Payer: Self-pay

## 2020-12-24 ENCOUNTER — Encounter (HOSPITAL_COMMUNITY): Payer: Self-pay | Admitting: *Deleted

## 2020-12-24 ENCOUNTER — Emergency Department (HOSPITAL_COMMUNITY)
Admission: EM | Admit: 2020-12-24 | Discharge: 2020-12-24 | Disposition: A | Discharge status: Home / Self Care | Payer: Medicare PPO | Attending: Emergency Medicine | Admitting: Emergency Medicine

## 2020-12-24 DIAGNOSIS — M542 Cervicalgia: Secondary | ICD-10-CM | POA: Insufficient documentation

## 2020-12-24 DIAGNOSIS — R079 Chest pain, unspecified: Secondary | ICD-10-CM | POA: Diagnosis not present

## 2020-12-24 DIAGNOSIS — R1013 Epigastric pain: Secondary | ICD-10-CM | POA: Diagnosis not present

## 2020-12-24 DIAGNOSIS — Z79899 Other long term (current) drug therapy: Secondary | ICD-10-CM | POA: Diagnosis not present

## 2020-12-24 DIAGNOSIS — Z8616 Personal history of COVID-19: Secondary | ICD-10-CM | POA: Diagnosis not present

## 2020-12-24 DIAGNOSIS — Z955 Presence of coronary angioplasty implant and graft: Secondary | ICD-10-CM | POA: Insufficient documentation

## 2020-12-24 DIAGNOSIS — E119 Type 2 diabetes mellitus without complications: Secondary | ICD-10-CM | POA: Insufficient documentation

## 2020-12-24 DIAGNOSIS — Z87891 Personal history of nicotine dependence: Secondary | ICD-10-CM | POA: Insufficient documentation

## 2020-12-24 DIAGNOSIS — K219 Gastro-esophageal reflux disease without esophagitis: Secondary | ICD-10-CM | POA: Insufficient documentation

## 2020-12-24 DIAGNOSIS — R109 Unspecified abdominal pain: Secondary | ICD-10-CM | POA: Diagnosis not present

## 2020-12-24 DIAGNOSIS — I10 Essential (primary) hypertension: Secondary | ICD-10-CM | POA: Insufficient documentation

## 2020-12-24 DIAGNOSIS — R0789 Other chest pain: Secondary | ICD-10-CM | POA: Diagnosis not present

## 2020-12-24 LAB — HEPATIC FUNCTION PANEL
ALT: 31 U/L (ref 0–44)
AST: 28 U/L (ref 15–41)
Albumin: 4.7 g/dL (ref 3.5–5.0)
Alkaline Phosphatase: 64 U/L (ref 38–126)
Bilirubin, Direct: 0.2 mg/dL (ref 0.0–0.2)
Indirect Bilirubin: 0.4 mg/dL (ref 0.3–0.9)
Total Bilirubin: 0.6 mg/dL (ref 0.3–1.2)
Total Protein: 8.2 g/dL — ABNORMAL HIGH (ref 6.5–8.1)

## 2020-12-24 LAB — TROPONIN I (HIGH SENSITIVITY)
Troponin I (High Sensitivity): 3 ng/L (ref <18)
Troponin I (High Sensitivity): 3 ng/L (ref <18)

## 2020-12-24 LAB — BASIC METABOLIC PANEL
Anion gap: 10 (ref 5–15)
BUN: 14 mg/dL (ref 8–23)
CO2: 26 mmol/L (ref 22–32)
Calcium: 9.9 mg/dL (ref 8.9–10.3)
Chloride: 99 mmol/L (ref 98–111)
Creatinine, Ser: 0.83 mg/dL (ref 0.44–1.00)
GFR, Estimated: >60 mL/min (ref >60)
Glucose, Bld: 107 mg/dL — ABNORMAL HIGH (ref 70–99)
Potassium: 3.6 mmol/L (ref 3.5–5.1)
Sodium: 135 mmol/L (ref 135–145)

## 2020-12-24 LAB — CBC
HCT: 39.3 % (ref 36.0–46.0)
Hemoglobin: 12.6 g/dL (ref 12.0–15.0)
MCH: 26.8 pg (ref 26.0–34.0)
MCHC: 32.1 g/dL (ref 30.0–36.0)
MCV: 83.6 fL (ref 80.0–100.0)
Platelets: 212 10*3/uL (ref 150–400)
RBC: 4.7 MIL/uL (ref 3.87–5.11)
RDW: 15.1 % (ref 11.5–15.5)
WBC: 5.3 10*3/uL (ref 4.0–10.5)
nRBC: 0 % (ref 0.0–0.2)

## 2020-12-24 LAB — LIPASE, BLOOD: Lipase: 41 U/L (ref 11–51)

## 2020-12-24 MED ORDER — FAMOTIDINE 20 MG PO TABS: 20.0000 mg | ORAL_TABLET | Freq: Two times a day (BID) | ORAL | 0 refills | Status: DC

## 2020-12-24 MED ORDER — ALUM & MAG HYDROXIDE-SIMETH 200-200-20 MG/5ML PO SUSP
30.0000 mL | Freq: Once | ORAL | Status: AC
  Administered 2020-12-24: 30 mL via ORAL
  Filled 2020-12-24: qty 30

## 2020-12-24 MED ORDER — IOHEXOL 300 MG/ML  SOLN
100.0000 mL | Freq: Once | INTRAMUSCULAR | Status: AC | PRN
  Administered 2020-12-24: 100 mL via INTRAVENOUS

## 2020-12-24 MED ORDER — FAMOTIDINE IN NACL 20-0.9 MG/50ML-% IV SOLN
20.0000 mg | Freq: Once | INTRAVENOUS | Status: AC
  Administered 2020-12-24: 20 mg via INTRAVENOUS
  Filled 2020-12-24: qty 50

## 2020-12-24 MED ORDER — PANTOPRAZOLE SODIUM 40 MG IV SOLR
40.0000 mg | Freq: Once | INTRAVENOUS | Status: AC
  Administered 2020-12-24: 40 mg via INTRAVENOUS
  Filled 2020-12-24: qty 40

## 2020-12-24 MED ORDER — ALUM & MAG HYDROXIDE-SIMETH 400-400-40 MG/5ML PO SUSP: 10.0000 mL | Freq: Two times a day (BID) | ORAL | 0 refills | Status: AC | PRN

## 2020-12-24 NOTE — ED Notes (Signed)
PO gingerale given per [place] request.

## 2020-12-24 NOTE — Discharge Instructions (Addendum)
Continue taking your omeprazole at home. I have also prescribed pepcid and maalox/mylanta. Please take as directed.   Please follow up with your primary care provider within 5-7 days for re-evaluation of your symptoms. If you do not have a primary care provider, information for a healthcare clinic has been provided for you to make arrangements for follow up care. Please return to the emergency department for any new or worsening symptoms.

## 2020-12-24 NOTE — ED Provider Notes (Signed)
I provided a substantive portion of the care of this patient.  I personally performed the entirety of the medical decision making for this encounter.  EKG Interpretation  Date/Time:  Sunday December 24 2020 12:45:46 EST Ventricular Rate:  73 PR Interval:  126 QRS Duration: 82 QT Interval:  406 QTC Calculation: 447 R Axis:   79 Text Interpretation: Normal sinus rhythm Right atrial enlargement Borderline ECG Confirmed by Fredia Sorrow [phone]) on 12/24/2020 3:05:30 PM   Patient seen by me along with the physician assistant.  Patient with a complaint of epigastric abdominal pain may be with some lower chest pain ongoing since last week.  Patient was tearful in triage seem to be in some additional pain.  Also complained of some right neck pain for months since traveling to [place].  The main concern is more abdomen chest.  Initial troponin was 3 delta troponin is pending.  CT scan of her abdomen without any acute findings.  No leukocytosis liver function test without any significant abnormalities and lipase was normal at 41.  Course CT scan of the abdomen would not necessarily rule out peptic ulcer disease so that still a possibility.  If her second troponin is normal patient could be discharged home and treated as an outpatient.  On exam abdomen was flat no significant tenderness or guarding on my exam.  Addition regarding the chest discomfort her EKG had no acute findings.  And chest x-ray had no acute findings.   Fredia Sorrow, MD 12/24/20 1530

## 2020-12-24 NOTE — ED Triage Notes (Addendum)
Pt with mid CP since last week, pt called her pcp and acid reflux med was changed.  Pt states medication is not helping.  Pt tearful in triage. Pt with right neck pain as well for months since traveling to [place].

## 2020-12-24 NOTE — ED Provider Notes (Signed)
[facility] EMERGENCY DEPARTMENT Provider Note   CSN: [id number] Arrival date & time: 12/24/20  1243     History Chief Complaint  Patient presents with  . Chest Pain    Sophia [name] is a 70 y.o. adult.  HPI   71 y/o female with a h/o covid, anxiety, arthritis, depression, DM, GERD,  who presents to the ED today for eval of lower chest pain/epigastric abd pain that has been present for the last week. States it started after eating spaghetti. States sxs feel similar to when she has had acid reflux in the past. States she feels like she needs to burp and she is not able to. Denies that pain is worse with exertion.  Rates pain 10/10.  She tried taking pepto bismol without relief. Denies SOB, pleuritic pain, cough, nausea, vomiting, diaphoresis.  C/o pain to the right side of her neck for several months which is unchanged today. Denies leg pain/swelling, hemoptysis, recent surgery/trauma, recent long travel, hormone use, personal hx of cancer, or hx of DVT/PE.  Nov she flew to [place] and flight was 1 hour. No extended periods of travel.   States pcp started her on omeprazole and this is not improving sxs.  Reviewed records. Cath in 2019 showed no significant CAD.  Past Medical History:  Diagnosis Date  . Acute respiratory disease due to COVID-19 virus 08/28/2019  . Anxiety   . Arthritis   . Depression   . Diabetes mellitus type 2 in obese [facility]) 11/28/2010   Qualifier: Diagnosis of  By: Moshe Cipro MD, Margaret  Diet controlled in 12/2016   . Diabetes mellitus without complication ([place])   . GERD (gastroesophageal reflux disease)   . Hypercholesteremia   . Hypertension   . PONV (postoperative nausea and vomiting)   . Sleep apnea     Patient Active Problem List   Diagnosis Date Noted  . Panic anxiety syndrome 12/06/2020  . Depression, major, single episode, complete remission ([place]) 12/12/2019  . Prediabetes 06/01/2019  . Current nicotine use 06/01/2019  . GAD (generalized anxiety  disorder) 12/03/2018  . Leg cramps 06/15/2018  . Panic attack 06/15/2018  . Tubular adenoma of colon 04/28/2014  . History of rectal bleeding 02/19/2014  . Back pain with radiation 10/05/2013  . Seasonal allergies 05/01/2011  . Palpitations 05/17/2009  . Obesity (BMI 30-39.9) 02/24/2009  . Hyperlipidemia with target LDL less than 100 06/17/2008  . Depression with anxiety 11/21/2006  . Essential hypertension 11/21/2006  . GERD 11/21/2006    Past Surgical History:  Procedure Laterality Date  . ABDOMINAL HYSTERECTOMY  1982   tubes and womb, bleeding and ectopic  . ABDOMINAL SURGERY     removal of tumors  . BREAST SURGERY Left 1983   benign tumor  . CATARACT EXTRACTION W/PHACO Right 01/11/2014   Procedure: CATARACT EXTRACTION PHACO AND INTRAOCULAR LENS PLACEMENT (IOC);  Surgeon: Elta Guadeloupe T. Gershon Crane, MD;  Location: [facility];  Service: Ophthalmology;  Laterality: Right;  CDE 5.57  . CATARACT EXTRACTION W/PHACO Left 01/25/2014   Procedure: CATARACT EXTRACTION PHACO AND INTRAOCULAR LENS PLACEMENT (IOC);  Surgeon: Elta Guadeloupe T. Gershon Crane, MD;  Location: [facility];  Service: Ophthalmology;  Laterality: Left;  CDE:5.19  . COLONOSCOPY  2007 [facility]   NL EXAM  . COLONOSCOPY N/A 04/22/2014   Dr. Fields:Normal mucosa in the terminal ileum/Two COLON polyps REMOVED/ Mild diverticulosis in the ascending colon and transverse colon/The LEFT colon IS redundant/Small internal hemorrhoids. Path: tubular adenoma. Next colonoscopy in 5-10 years  . COLONOSCOPY WITH PROPOFOL  N/A 11/27/2020   Procedure: COLONOSCOPY WITH PROPOFOL;  Surgeon: [name], DO;  Location: [facility];  Service: Endoscopy;  Laterality: N/A;  10:00  . ESOPHAGOGASTRODUODENOSCOPY N/A 04/22/2014   Dr. Fields:MICROCYTIC ANEMIA MOST LIKELY DUE TO ASA/VOLTAREN/Small hiatal hernia/MODERATE Non-erosive gastritis. Negative H.pylori  . LEFT HEART CATH AND CORONARY ANGIOGRAPHY N/A 06/23/2018   Procedure: LEFT HEART CATH AND CORONARY ANGIOGRAPHY;  Surgeon:  Jettie Booze, MD;  Location: [facility];  Service: Cardiovascular;  Laterality: N/A;  . POLYPECTOMY  11/27/2020   Procedure: POLYPECTOMY;  Surgeon: [name], DO;  [facility];  Service: Endoscopy;;  . 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 ULTRASOUND GUIDANCE FOR VASCULAR ACCESS  06/23/2018   Procedure: Ultrasound Guidance For Vascular Access;  Surgeon: Jettie Booze, MD;  Location: [facility];  Service: Cardiovascular;;  . UPPER GASTROINTESTINAL ENDOSCOPY  2007 CHEST PAIN   NL EXAM  . YAG LASER APPLICATION Right [id number]   Procedure: YAG LASER APPLICATION;  Surgeon: Elta Guadeloupe T. Gershon Crane, MD;  Location: [facility];  Service: Ophthalmology;  Laterality: Right;  . YAG LASER APPLICATION Left [id number]   Procedure: YAG LASER APPLICATION;  Surgeon: Rutherford Guys, MD;  Location: [facility];  Service: Ophthalmology;  Laterality: Left;     OB History   No obstetric history on file.     Family History  Problem Relation Age of Onset  . Colon polyps Sister 40  . Diabetes Sister   . Hypertension Sister   . Kidney disease Sister   . Arthritis Father   . Cancer Father        prostate   . Hypertension Father   . Diabetes Sister   . Hypertension Sister   . CAD Paternal Grandmother   . Colon cancer Neg Hx     Social History   Tobacco Use  . Smoking status: Former Smoker    Packs/day: 0.25    Years: 20.00    Pack years: 5.00    Types: Cigarettes    Quit date: 01/05/1990    Years since quitting: 30.9  . Smokeless tobacco: Current User    Types: Snuff  Vaping Use  . Vaping Use: Never used  Substance Use Topics  . Alcohol use: No  . Drug use: Yes    Types: Marijuana    Home Medications Prior to Admission medications   Medication Sig Start Date End Date Taking? Authorizing Provider  alum & mag hydroxide-simeth (MAALOX PLUS) 400-400-40 MG/5ML suspension Take 10 mLs by mouth every 12 (twelve) hours as needed for up to 7 days for  indigestion. 12/24/20 12/31/20 Yes Dimitrious Micciche S, PA-C  famotidine (PEPCID) 20 MG tablet Take 1 tablet (20 mg total) by mouth 2 (two) times daily. 12/24/20  Yes Amaiyah Nordhoff S, PA-C  alprazolam (XANAX) 2 MG tablet Take one tablet by mouth three times weekly, as needed, for anxiety and panic 12/06/20   Fayrene Helper, MD  amLODipine (NORVASC) 10 MG tablet TAKE 1 TABLET EVERY DAY 12/11/20   Fayrene Helper, MD  Ascorbic Acid (VITAMIN C) 500 MG CAPS Take 500 mg by mouth daily.     [provider]  benazepril-hydrochlorthiazide (LOTENSIN HCT) 20-12.5 MG tablet TAKE 2 TABLETS EVERY DAY 12/11/20   Fayrene Helper, MD  Black Currant Seed Oil 500 MG CAPS Take 500 mg by mouth daily. 5 ml liquid    [provider]  buPROPion (WELLBUTRIN XL) 150 MG 24 hr tablet  Take 1 tablet (150 mg total) by mouth daily. 12/05/20   Fayrene Helper, MD  busPIRone (BUSPAR) 7.5 MG tablet TAKE 1 TABLET THREE TIMES DAILY (DOSE INCREASE) 12/04/20   Fayrene Helper, MD  Cholecalciferol (VITAMIN D3) 250 MCG (10000 UT) capsule Take 10,000 Units by mouth daily.    [provider]  FLUoxetine (PROZAC) 40 MG capsule TAKE 1 CAPSULE EVERY DAY 12/11/20   Fayrene Helper, MD  gabapentin (NEURONTIN) 100 MG capsule TAKE 1 CAPSULE THREE TIMES DAILY 12/04/20   Fayrene Helper, MD  Javier Docker Oil (OMEGA-3) 500 MG CAPS Take 500 mg by mouth daily.     [provider]  Multiple Vitamin (MULTIVITAMIN WITH MINERALS) TABS tablet Take 1 tablet by mouth daily.    [provider]  omeprazole (PRILOSEC) 40 MG capsule Take 1 capsule (40 mg total) by mouth daily. 12/21/20   Fayrene Helper, MD  pravastatin (PRAVACHOL) 40 MG tablet TAKE 1 TABLET EVERY EVENING 12/11/20   Fayrene Helper, MD    Allergies    Patient has no known allergies.  Review of Systems   Review of Systems  Constitutional: Negative for chills and fever.  HENT: Negative for ear pain and sore throat.   Eyes: Negative  for pain and visual disturbance.  Respiratory: Negative for cough and shortness of breath.   Cardiovascular: Positive for chest pain. Negative for leg swelling.  Gastrointestinal: Positive for abdominal pain. Negative for diarrhea, nausea and vomiting.  Genitourinary: Negative for dysuria and hematuria.  Musculoskeletal: Negative for back pain.  Skin: Negative for color change and rash.  Neurological: Negative for seizures and syncope.  All other systems reviewed and are negative.   Physical Exam Updated Vital Signs BP 140/70   Pulse 68   Temp 98 F (36.7 C) (Oral)   Resp 13   Ht 5\' 9"  (1.753 m)   Wt 81.6 kg   SpO2 100%   BMI 26.58 kg/m   Physical Exam Vitals and nursing note reviewed.  Constitutional:      General: She is not in acute distress.    Appearance: She is well-developed and well-nourished.  HENT:     Head: Normocephalic and atraumatic.  Eyes:     Conjunctiva/sclera: Conjunctivae normal.  Cardiovascular:     Rate and Rhythm: Normal rate and regular rhythm.     Heart sounds: Normal heart sounds. No murmur heard.   Pulmonary:     Effort: Pulmonary effort is normal. No respiratory distress.     Breath sounds: Normal breath sounds. No decreased breath sounds, wheezing, rhonchi or rales.  Abdominal:     General: Bowel sounds are normal.     Palpations: Abdomen is soft.     Tenderness: There is abdominal tenderness in the right upper quadrant and epigastric area. There is guarding. There is no rebound.  Musculoskeletal:        General: No edema.     Cervical back: Neck supple.     Right lower leg: No tenderness. No edema.     Left lower leg: No tenderness. No edema.  Skin:    General: Skin is warm and dry.  Neurological:     Mental Status: She is alert.  Psychiatric:        Mood and Affect: Mood and affect normal.     ED Results / Procedures / Treatments   Labs (all labs ordered are listed, but only abnormal results are displayed) Labs Reviewed   BASIC METABOLIC PANEL -  Abnormal; Notable for the following components:      Result Value   Glucose, Bld 107 (*)    All other components within normal limits  HEPATIC FUNCTION PANEL - Abnormal; Notable for the following components:   Total Protein 8.2 (*)    All other components within normal limits  CBC  LIPASE, BLOOD  TROPONIN I (HIGH SENSITIVITY)  TROPONIN I (HIGH SENSITIVITY)    EKG EKG Interpretation  Date/Time:  Sunday December 24 2020 12:45:46 EST Ventricular Rate:  73 PR Interval:  126 QRS Duration: 82 QT Interval:  406 QTC Calculation: 447 R Axis:   79 Text Interpretation: Normal sinus rhythm Right atrial enlargement Borderline ECG Confirmed by Zackowski, Scott ([id number]) on 12/24/2020 3:05:30 PM   Radiology DG Chest 2 View  Result Date: 12/24/2020 CLINICAL DATA:  Chest pain EXAM: CHEST - 2 VIEW COMPARISON:  09/13/2019 FINDINGS: The heart size and mediastinal contours are within normal limits. Both lungs are clear. The visualized skeletal structures are unremarkable. IMPRESSION: No active cardiopulmonary disease. Electronically Signed   By: Hetal  Patel   On: 12/24/2020 14:26   CT ABDOMEN PELVIS W CONTRAST  Result Date: 12/24/2020 CLINICAL DATA:  Acute epigastric abdominal pain. EXAM: CT ABDOMEN AND PELVIS WITH CONTRAST TECHNIQUE: Multidetector CT imaging of the abdomen and pelvis was performed using the standard protocol following bolus administration of intravenous contrast. CONTRAST:  100mL OMNIPAQUE IOHEXOL 300 MG/ML  SOLN COMPARISON:  August 28, 2019. FINDINGS: Lower chest: No acute abnormality. Hepatobiliary: No cholelithiasis is noted. Common bile duct is dilated at 13 mm which is not significantly changed compared to prior exam. The liver is unremarkable. Pancreas: Unremarkable. No pancreatic ductal dilatation or surrounding inflammatory changes. Spleen: Normal in size without focal abnormality. Adrenals/Urinary Tract: Adrenal glands are unremarkable. Kidneys are normal,  without renal calculi, focal lesion, or hydronephrosis. Bladder is unremarkable. Stomach/Bowel: The stomach is unremarkable. There is no evidence of bowel obstruction or inflammation. The appendix is not visualized. Vascular/Lymphatic: Aortic atherosclerosis. No enlarged abdominal or pelvic lymph nodes. Reproductive: Status post hysterectomy. No adnexal masses. Other: No abdominal wall hernia or abnormality. No abdominopelvic ascites. Musculoskeletal: No acute or significant osseous findings. IMPRESSION: 1. Aortic atherosclerosis. 2. No acute abnormality seen in the abdomen or pelvis. Aortic Atherosclerosis (ICD10-I70.0). Electronically Signed   By: James  Green Jr M.D.   On: 12/24/2020 15:09    Procedures Procedures   Medications Ordered in ED Medications  famotidine (PEPCID) IVPB 20 mg premix (0 mg Intravenous Stopped 12/24/20 1426)  pantoprazole (PROTONIX) injection 40 mg (40 mg Intravenous Given 12/24/20 1357)  alum & mag hydroxide-simeth (MAALOX/MYLANTA) 200-200-20 MG/5ML suspension 30 mL (30 mLs Oral Given 12/24/20 1356)  iohexol (OMNIPAQUE) 300 MG/ML solution 100 mL (100 mLs Intravenous Contrast Given 12/24/20 1444)    ED Course  I have reviewed the triage vital signs and the nursing notes.  Pertinent labs & imaging results that were available during my care of the patient were reviewed by me and considered in my medical decision making (see chart for details).    MDM Rules/Calculators/A&P                          69  y/o F presenting for eval of epigastric abd pain/lower chest pain. She feels sxs are consistent with prior hx of GERD  Reviewed/interpreted labs CBC wnl BMP unremarkable Liver enzymes unremarkable Lipase wnl Trop neg x2  EKG - Normal sinus rhythm Right atrial enlargement Borderline ECG   Reviewed/interpreted  imaging CXR - unremarkable CT abd/pelvis - 1. Aortic atherosclerosis. 2. No acute abnormality seen in the abdomen or pelvis.  Pt was given protonix, pepcid and a  GI cocktail. On reassessment she feels completely improved. She is now able to tolerate PO. I suspect her sxs are likely GI related rather than cardiac or pulmonary. Recent cath in 2019 was clean. Will give rx for pepcid and maalox/mylanta. Have advised that she also continue her ppi. Advised on close f/u with pcp and strict return precautions. She voices understanding of the plan and reasons to return. All questions answered, pt stable for d/c.   Case discussed with Dr. Rogene Houston who personally evaluated the patient and is in agreement with the plan.   Final Clinical Impression(s) / ED Diagnoses Final diagnoses:  Atypical chest pain  Epigastric abdominal pain    Rx / DC Orders ED Discharge Orders         Ordered    alum & mag hydroxide-simeth (MAALOX PLUS) 400-400-40 MG/5ML suspension  Every 12 hours PRN        12/24/20 1529    famotidine (PEPCID) 20 MG tablet  2 times daily        12/24/20 [address] Magdalena Skilton S, PA-C 12/24/20 1644    Fredia Sorrow, MD 12/29/20 [phone]

## 2020-12-25 ENCOUNTER — Telehealth: Payer: Self-pay | Admitting: Family Medicine

## 2020-12-25 ENCOUNTER — Encounter: Payer: Self-pay | Admitting: Internal Medicine

## 2020-12-25 DIAGNOSIS — K219 Gastro-esophageal reflux disease without esophagitis: Secondary | ICD-10-CM

## 2020-12-25 NOTE — Telephone Encounter (Signed)
Please advise that I have reviewed her recent ED visit, and recommend evaluation for uncontrolled GERD by GI, and have entered the referral so that office should call her with an appt

## 2020-12-26 ENCOUNTER — Other Ambulatory Visit: Payer: Self-pay

## 2020-12-26 ENCOUNTER — Encounter: Payer: Self-pay | Admitting: Nurse Practitioner

## 2020-12-26 ENCOUNTER — Ambulatory Visit (INDEPENDENT_AMBULATORY_CARE_PROVIDER_SITE_OTHER): Payer: Medicare PPO | Admitting: Nurse Practitioner

## 2020-12-26 DIAGNOSIS — S161XXA Strain of muscle, fascia and tendon at neck level, initial encounter: Secondary | ICD-10-CM | POA: Diagnosis not present

## 2020-12-26 MED ORDER — IBUPROFEN 600 MG PO TABS: 600.0000 mg | ORAL_TABLET | Freq: Three times a day (TID) | ORAL | 0 refills | Status: DC | PRN

## 2020-12-26 MED ORDER — CYCLOBENZAPRINE HCL 5 MG PO TABS: 5.0000 mg | ORAL_TABLET | Freq: Three times a day (TID) | ORAL | 1 refills | Status: DC | PRN

## 2020-12-26 NOTE — Assessment & Plan Note (Signed)
-  Rx. Cyclobenzaprine -Rx. Ibuprofen

## 2020-12-26 NOTE — Progress Notes (Signed)
Acute Office Visit  Subjective:    Patient ID: Sophia [name], adult    DOB: 08/04/1951, 70 y.o.   MRN: [id number]  Chief Complaint  Patient presents with  . Otalgia    R ear pain x 1 week.     HPI Patient is in today for fight ear pain x 1 week. Her pain radiates into her neck.  Earlier in she was having sinus congestion and sinus pressure, but that has since resolved.   Denies changes in her hearing. Her pain is worse when she moves her head.  Past Medical History:  Diagnosis Date  . Acute respiratory disease due to COVID-19 virus 08/28/2019  . Anxiety   . Arthritis   . Depression   . Diabetes mellitus type 2 in obese [facility]) 11/28/2010   Qualifier: Diagnosis of  By: Moshe Cipro MD, Margaret  Diet controlled in 12/2016   . Diabetes mellitus without complication ([place])   . GERD (gastroesophageal reflux disease)   . Hypercholesteremia   . Hypertension   . PONV (postoperative nausea and vomiting)   . Sleep apnea     Past Surgical History:  Procedure Laterality Date  . ABDOMINAL HYSTERECTOMY  1982   tubes and womb, bleeding and ectopic  . ABDOMINAL SURGERY     removal of tumors  . BREAST SURGERY Left 1983   benign tumor  . CATARACT EXTRACTION W/PHACO Right 01/11/2014   Procedure: CATARACT EXTRACTION PHACO AND INTRAOCULAR LENS PLACEMENT (IOC);  Surgeon: Elta Guadeloupe T. Gershon Crane, MD;  Location: [facility];  Service: Ophthalmology;  Laterality: Right;  CDE 5.57  . CATARACT EXTRACTION W/PHACO Left 01/25/2014   Procedure: CATARACT EXTRACTION PHACO AND INTRAOCULAR LENS PLACEMENT (IOC);  Surgeon: Elta Guadeloupe T. Gershon Crane, MD;  Location: [facility];  Service: Ophthalmology;  Laterality: Left;  CDE:5.19  . COLONOSCOPY  2007 [facility]   NL EXAM  . COLONOSCOPY N/A 04/22/2014   Dr. Fields:Normal mucosa in the terminal ileum/Two COLON polyps REMOVED/ Mild diverticulosis in the ascending colon and transverse colon/The LEFT colon IS redundant/Small internal hemorrhoids. Path: tubular adenoma. Next colonoscopy in 5-10 years   . COLONOSCOPY WITH PROPOFOL N/A 11/27/2020   Procedure: COLONOSCOPY WITH PROPOFOL;  Surgeon: [name], DO;  Location: [facility];  Service: Endoscopy;  Laterality: N/A;  10:00  . ESOPHAGOGASTRODUODENOSCOPY N/A 04/22/2014   Dr. Fields:MICROCYTIC ANEMIA MOST LIKELY DUE TO ASA/VOLTAREN/Small hiatal hernia/MODERATE Non-erosive gastritis. Negative H.pylori  . LEFT HEART CATH AND CORONARY ANGIOGRAPHY N/A 06/23/2018   Procedure: LEFT HEART CATH AND CORONARY ANGIOGRAPHY;  Surgeon: Jettie Booze, MD;  Location: [place] [facility];  Service: Cardiovascular;  Laterality: N/A;  . POLYPECTOMY  11/27/2020   Procedure: POLYPECTOMY;  Surgeon: [name], DO;  [facility];  Service: Endoscopy;;  . 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 ULTRASOUND GUIDANCE FOR VASCULAR ACCESS  06/23/2018   Procedure: Ultrasound Guidance For Vascular Access;  Surgeon: Jettie Booze, MD;  Location: [place] [facility];  Service: Cardiovascular;;  . UPPER GASTROINTESTINAL ENDOSCOPY  2007 CHEST PAIN   NL EXAM  . YAG LASER APPLICATION Right 8/67/6195   Procedure: YAG LASER APPLICATION;  Surgeon: Elta Guadeloupe T. Gershon Crane, MD;  Location: [facility];  Service: Ophthalmology;  Laterality: Right;  . YAG LASER APPLICATION Left 0/93/2671   Procedure: YAG LASER APPLICATION;  Surgeon: Rutherford Guys, MD;  Location: [facility];  Service: Ophthalmology;  Laterality: Left;    Family History  Problem Relation Age of Onset  . Colon polyps Sister  20  . Diabetes Sister   . Hypertension Sister   . Kidney disease Sister   . Arthritis Father   . Cancer Father        prostate   . Hypertension Father   . Diabetes Sister   . Hypertension Sister   . CAD Paternal Grandmother   . Colon cancer Neg Hx     Social History   Socioeconomic History  . Marital status: Married    Spouse name: Not on file  . Number of children: Not on file  . Years of education: Not on file  . Highest education level: Not on file   Occupational History  . Not on file  Tobacco Use  . Smoking status: Former Smoker    Packs/day: 0.25    Years: 20.00    Pack years: 5.00    Types: Cigarettes    Quit date: 01/05/1990    Years since quitting: 30.9  . Smokeless tobacco: Current User    Types: Snuff  Vaping Use  . Vaping Use: Never used  Substance and Sexual Activity  . Alcohol use: No  . Drug use: Yes    Types: Marijuana  . Sexual activity: Not Currently    Birth control/protection: Surgical  Other Topics Concern  . Not on file  Social History Narrative  . Not on file   Social Determinants of Health   Financial Resource Strain: Low Risk   . Difficulty of Paying Living Expenses: Not hard at all  Food Insecurity: No Food Insecurity  . Worried About Charity fundraiser in the Last Year: Never true  . Ran Out of Food in the Last Year: Never true  Transportation Needs: No Transportation Needs  . Lack of Transportation (Medical): No  . Lack of Transportation (Non-Medical): No  Physical Activity: Insufficiently Active  . Days of Exercise per Week: 3 days  . Minutes of Exercise per Session: 30 min  Stress: No Stress Concern Present  . Feeling of Stress : Only a little  Social Connections: Moderately Integrated  . Frequency of Communication with Friends and Family: More than three times a week  . Frequency of Social Gatherings with Friends and Family: Twice a week  . Attends Religious Services: 1 to 4 times per year  . Active Member of Clubs or Organizations: No  . Attends Archivist Meetings: Never  . Marital Status: Married  Human resources officer Violence: Not At Risk  . Fear of Current or Ex-Partner: No  . Emotionally Abused: No  . Physically Abused: No  . Sexually Abused: No    Outpatient Medications Prior to Visit  Medication Sig Dispense Refill  . alprazolam (XANAX) 2 MG tablet Take one tablet by mouth three times weekly, as needed, for anxiety and panic 12 tablet 1  . alum & mag  hydroxide-simeth (MAALOX PLUS) 400-400-40 MG/5ML suspension Take 10 mLs by mouth every 12 (twelve) hours as needed for up to 7 days for indigestion. 140 mL 0  . amLODipine (NORVASC) 10 MG tablet TAKE 1 TABLET EVERY DAY 90 tablet 0  . Ascorbic Acid (VITAMIN C) 500 MG CAPS Take 500 mg by mouth daily.     . benazepril-hydrochlorthiazide (LOTENSIN HCT) 20-12.5 MG tablet TAKE 2 TABLETS EVERY DAY 180 tablet 0  . Black Currant Seed Oil 500 MG CAPS Take 500 mg by mouth daily. 5 ml liquid    . buPROPion (WELLBUTRIN XL) 150 MG 24 hr tablet Take 1 tablet (150 mg total) by mouth daily.  30 tablet 2  . busPIRone (BUSPAR) 7.5 MG tablet TAKE 1 TABLET THREE TIMES DAILY (DOSE INCREASE) 270 tablet 3  . Cholecalciferol (VITAMIN D3) 250 MCG (10000 UT) capsule Take 10,000 Units by mouth daily.    . famotidine (PEPCID) 20 MG tablet Take 1 tablet (20 mg total) by mouth 2 (two) times daily. 30 tablet 0  . FLUoxetine (PROZAC) 40 MG capsule TAKE 1 CAPSULE EVERY DAY 90 capsule 0  . gabapentin (NEURONTIN) 100 MG capsule TAKE 1 CAPSULE THREE TIMES DAILY 270 capsule 0  . Krill Oil (OMEGA-3) 500 MG CAPS Take 500 mg by mouth daily.     . Multiple Vitamin (MULTIVITAMIN WITH MINERALS) TABS tablet Take 1 tablet by mouth daily.    Marland Kitchen omeprazole (PRILOSEC) 40 MG capsule Take 1 capsule (40 mg total) by mouth daily. 30 capsule 3  . pravastatin (PRAVACHOL) 40 MG tablet TAKE 1 TABLET EVERY EVENING 90 tablet 0   No facility-administered medications prior to visit.    No Known Allergies  Review of Systems  Constitutional: Negative.   HENT: Positive for ear pain.   Respiratory: Negative.   Cardiovascular: Negative.        Objective:    Physical Exam Constitutional:      Appearance: Normal appearance.  HENT:     Right Ear: Tympanic membrane, ear canal and external ear normal.     Left Ear: Tympanic membrane, ear canal and external ear normal.  Neck:     Comments: Pain with ROM exercises; right neck muscles  stiff Musculoskeletal:     Cervical back: Tenderness present.  Neurological:     Mental Status: She is alert.     BP 128/69   Pulse 84   Temp 99.3 F (37.4 C)   Resp 20   Ht 5\' 9"  (1.753 m)   Wt 187 lb (84.8 kg)   SpO2 94%   BMI 27.62 kg/m  Wt Readings from Last 3 Encounters:  12/26/20 187 lb (84.8 kg)  12/24/20 180 lb (81.6 kg)  12/05/20 187 lb (84.8 kg)    There are no preventive care reminders to display for this patient.  There are no preventive care reminders to display for this patient.   Lab Results  Component Value Date   TSH 1.27 09/01/2020   Lab Results  Component Value Date   WBC 5.3 12/24/2020   HGB 12.6 12/24/2020   HCT 39.3 12/24/2020   MCV 83.6 12/24/2020   PLT 212 12/24/2020   Lab Results  Component Value Date   NA 135 12/24/2020   K 3.6 12/24/2020   CO2 26 12/24/2020   GLUCOSE 107 (H) 12/24/2020   BUN 14 12/24/2020   CREATININE 0.83 12/24/2020   BILITOT 0.6 12/24/2020   ALKPHOS 64 12/24/2020   AST 28 12/24/2020   ALT 31 12/24/2020   PROT 8.2 (H) 12/24/2020   ALBUMIN 4.7 12/24/2020   CALCIUM 9.9 12/24/2020   ANIONGAP 10 12/24/2020   Lab Results  Component Value Date   CHOL 185 09/01/2020   Lab Results  Component Value Date   HDL 66 09/01/2020   Lab Results  Component Value Date   LDLCALC 97 09/01/2020   Lab Results  Component Value Date   TRIG 122 09/01/2020   Lab Results  Component Value Date   CHOLHDL 2.8 09/01/2020   Lab Results  Component Value Date   HGBA1C 5.8 (H) 11/30/2020       Assessment & Plan:   Problem List Items Addressed This  Visit      Musculoskeletal and Integument   Neck muscle strain, initial encounter    -Rx. Cyclobenzaprine -Rx. Ibuprofen           Meds ordered this encounter  Medications  . ibuprofen (ADVIL) 600 MG tablet    Sig: Take 1 tablet (600 mg total) by mouth every 8 (eight) hours as needed for headache, mild pain or moderate pain.    Dispense:  30 tablet    Refill:  0   . cyclobenzaprine (FLEXERIL) 5 MG tablet    Sig: Take 1 tablet (5 mg total) by mouth 3 (three) times daily as needed for muscle spasms.    Dispense:  30 tablet    Refill:  [place], NP

## 2020-12-26 NOTE — Patient Instructions (Signed)
I sent in a muscle relaxer and ibuprofen for your right neck pain. If you aren't better in a week, return to the clinic.

## 2020-12-27 NOTE — Telephone Encounter (Signed)
Pt aware.

## 2021-01-15 ENCOUNTER — Ambulatory Visit: Payer: Medicare PPO | Admitting: Family Medicine

## 2021-01-16 ENCOUNTER — Other Ambulatory Visit: Payer: Self-pay

## 2021-01-16 ENCOUNTER — Ambulatory Visit: Payer: Medicare PPO | Admitting: Family Medicine

## 2021-01-16 ENCOUNTER — Encounter: Payer: Self-pay | Admitting: Family Medicine

## 2021-01-16 VITALS — BP 154/75 | HR 86 | Resp 16 | Ht 69.0 in | Wt 196.0 lb

## 2021-01-16 DIAGNOSIS — F411 Generalized anxiety disorder: Secondary | ICD-10-CM | POA: Diagnosis not present

## 2021-01-16 DIAGNOSIS — I10 Essential (primary) hypertension: Secondary | ICD-10-CM | POA: Diagnosis not present

## 2021-01-16 DIAGNOSIS — F418 Other specified anxiety disorders: Secondary | ICD-10-CM

## 2021-01-16 DIAGNOSIS — E785 Hyperlipidemia, unspecified: Secondary | ICD-10-CM

## 2021-01-16 MED ORDER — CHLORTHALIDONE 25 MG PO TABS: 25.0000 mg | ORAL_TABLET | Freq: Every day | ORAL | 2 refills | Status: DC

## 2021-01-16 NOTE — Patient Instructions (Addendum)
F/U in 8 weeks,  Call if you need me before  New additional medication for blood pressure is chlorthalidone 25 mg one daly, continue your 2 current medications as befoe  Thankful that depression is much better , will find a face to face therapist for you   pls get fasting lipid, cmp and EGFR 5 days before next visit  It is important that you exercise regularly at least 30 minutes 5 times a week. If you develop chest pain, have severe difficulty breathing, or feel very tired, stop exercising immediately and seek medical attention    Thanks for choosing [facility], we consider it a privelige to serve you.

## 2021-01-20 NOTE — Progress Notes (Addendum)
Virtual Visit via Video Note  I connected with Sophia [name] on 01/20/21 at  9:40 AM EST by a video enabled telemedicine application and verified that I am speaking with the correct person using two identifiers.  Location: Patient: office Provider: remote office   I discussed the limitations of evaluation and management by telemedicine and the availability of in person appointments. The patient expressed understanding and agreed to proceed.  History of Present Illness: F/u depression and hypertension Denies recent fever or chills. Denies sinus pressure, nasal congestion, ear pain or sore throat. Denies chest congestion, productive cough or wheezing. Denies chest pains, palpitations and leg swelling Denies abdominal pain, nausea, vomiting,diarrhea or constipation.   Denies dysuria, frequency, hesitancy or incontinence. Denies joint pain, swelling and limitation in mobility. Denies headaches, seizures, numbness, or tingling. Denies uncontrolled depression, anxiety or insomnia. Denies skin break down or rash.       Observations/Objective:  BP (!) 154/75   Pulse 86   Resp 16   Ht 5\' 9"  (1.753 m)   Wt 196 lb (88.9 kg)   SpO2 96%   BMI 28.94 kg/m  Good communication with no confusion and intact memory. Alert and oriented x 3 No signs of respiratory distress during speech   Assessment and Plan: Essential hypertension Uncontrolled add chlorthalidone DASH diet and commitment to daily physical activity for a minimum of 30 minutes discussed and encouraged, as a part of hypertension management. The importance of attaining a healthy weight is also discussed.  BP/Weight 01/16/2021 12/26/2020 12/24/2020 12/05/2020 11/27/2020 09/19/2020 80/01/4916  Systolic BP 915 056 979 480 165 - 537  Diastolic BP 75 69 76 73 57 - 76  Wt. (Lbs) 196 187 180 187 187 186.8 191  BMI 28.94 27.62 26.58 27.62 27.62 27.59 28.21       Depression with anxiety Improved on current medication wants to find an  in person therapist, will work on this  GAD (generalized anxiety disorder) Improved, no med change , needs therapy    Follow Up Instructions:    I discussed the assessment and treatment plan with the patient. The patient was provided an opportunity to ask questions and all were answered. The patient agreed with the plan and demonstrated an understanding of the instructions.   The patient was advised to call back or seek an in-person evaluation if the symptoms worsen or if the condition fails to improve as anticipated.  I provided 15 minutes of non-face-to-face time during this encounter.   Tula Nakayama, MD

## 2021-01-20 NOTE — Assessment & Plan Note (Signed)
Improved, no med change , needs therapy

## 2021-01-20 NOTE — Assessment & Plan Note (Signed)
Improved on current medication wants to find an in person therapist, will work on this

## 2021-01-20 NOTE — Assessment & Plan Note (Signed)
Uncontrolled add chlorthalidone DASH diet and commitment to daily physical activity for a minimum of 30 minutes discussed and encouraged, as a part of hypertension management. The importance of attaining a healthy weight is also discussed.  BP/Weight 01/16/2021 12/26/2020 12/24/2020 12/05/2020 11/27/2020 09/19/2020 11/64/3539  Systolic BP 122 583 462 194 712 - 527  Diastolic BP 75 69 76 73 57 - 76  Wt. (Lbs) 196 187 180 187 187 186.8 191  BMI 28.94 27.62 26.58 27.62 27.62 27.59 28.21

## 2021-01-29 ENCOUNTER — Other Ambulatory Visit: Payer: Self-pay

## 2021-01-29 MED ORDER — OMEPRAZOLE 40 MG PO CPDR: 40.0000 mg | DELAYED_RELEASE_CAPSULE | Freq: Every day | ORAL | 0 refills | Status: DC

## 2021-01-31 ENCOUNTER — Encounter: Payer: Self-pay | Admitting: Internal Medicine

## 2021-01-31 ENCOUNTER — Ambulatory Visit: Payer: Medicare PPO | Admitting: Internal Medicine

## 2021-01-31 ENCOUNTER — Telehealth: Payer: Self-pay

## 2021-01-31 ENCOUNTER — Other Ambulatory Visit: Payer: Self-pay

## 2021-01-31 VITALS — BP 130/77 | HR 100 | Temp 96.6°F | Ht 69.0 in | Wt 193.8 lb

## 2021-01-31 DIAGNOSIS — D126 Benign neoplasm of colon, unspecified: Secondary | ICD-10-CM | POA: Diagnosis not present

## 2021-01-31 DIAGNOSIS — R1319 Other dysphagia: Secondary | ICD-10-CM | POA: Diagnosis not present

## 2021-01-31 DIAGNOSIS — K219 Gastro-esophageal reflux disease without esophagitis: Secondary | ICD-10-CM | POA: Diagnosis not present

## 2021-01-31 DIAGNOSIS — R1310 Dysphagia, unspecified: Secondary | ICD-10-CM | POA: Insufficient documentation

## 2021-01-31 NOTE — Patient Instructions (Signed)
We will schedule you for EGD with dilation to further evaluate your worsening reflux and difficulty swallowing.  Continue on omeprazole 40 mg daily.  Further recommendations to follow.  Lifestyle and home remedies TO MANAGE REFLUX/HEARTBURN    You may eliminate or reduce the frequency of heartburn by making the following lifestyle changes:    Control your weight. Being overweight is a major risk factor for heartburn and GERD. Excess pounds put pressure on your abdomen, pushing up your stomach and causing acid to back up into your esophagus.     Eat smaller meals. 4 TO 6 MEALS A DAY. This reduces pressure on the lower esophageal sphincter, helping to prevent the valve from opening and acid from washing back into your esophagus.      Loosen your belt. Clothes that fit tightly around your waist put pressure on your abdomen and the lower esophageal sphincter.      Eliminate heartburn triggers. Everyone has specific triggers. Common triggers such as fatty or fried foods, spicy food, tomato sauce, carbonated beverages, alcohol, chocolate, mint, garlic, onion, caffeine and nicotine may make heartburn worse.     Avoid stooping or bending. Tying your shoes is OK. Bending over for longer periods to weed your garden isn't, especially soon after eating.     Don't lie down after a meal. Wait at least three to four hours after eating before going to bed, and don't lie down right after eating.   At [facility] we value your feedback. You may receive a survey about your visit today. Please share your experience as we strive to create trusting relationships with our patients to provide genuine, compassionate, quality care.  We appreciate your understanding and patience as we review any laboratory studies, imaging, and other diagnostic tests that are ordered as we care for you. Our office policy is 5 business days for review of these results, and any emergent or urgent results are  addressed in a timely manner for your best interest. If you do not hear from our office in 1 week, please contact us.   We also encourage the use of MyChart, which contains your medical information for your review as well. If you are not enrolled in this feature, an access code is on this after visit summary for your convenience. Thank you for allowing [place] to be involved in your care.  It was great to see you today!  I hope you have a great rest of your spring!!    Elon Alas. Abbey Chatters, D.O. Gastroenterology and Hepatology [facility]

## 2021-01-31 NOTE — Progress Notes (Signed)
Referring Provider: Fayrene Helper, MD Primary Care Physician:  Fayrene Helper, MD Primary GI:  Dr. Abbey Chatters  Chief Complaint  Patient presents with  . Gastroesophageal Reflux    Food getting stuck, V8 juice helps    HPI:   Sophia [name] is a 70 y.o. adult who presents to the clinic today for follow-up visit.  Recently had a colonoscopy in January which were showed 1 tubular adenoma.  Colonoscopy recall 5 years.  She presents today to discuss worsening reflux.  She states she has had progressively worsening heartburn and reflux.  Previously on famotidine with breakthrough symptoms.  Recently switched to omeprazole 40 mg daily approximately 1 week ago.  Has not noticed any difference as of yet.  Does take ibuprofen 600 mg 3 times a day for chronic arthritis.  Also notes progressively worsening dysphagia primarily with foods getting stuck in her substernal region.  Underwent EGD in 2015 with H. pylori negative gastritis and small hiatal hernia  Past Medical History:  Diagnosis Date  . Acute respiratory disease due to COVID-19 virus 08/28/2019  . Anxiety   . Arthritis   . Depression   . Diabetes mellitus type 2 in obese [facility]) 11/28/2010   Qualifier: Diagnosis of  By: Moshe Cipro MD, Margaret  Diet controlled in 12/2016   . Diabetes mellitus without complication ([place])   . GERD (gastroesophageal reflux disease)   . Hypercholesteremia   . Hypertension   . PONV (postoperative nausea and vomiting)   . Sleep apnea     Past Surgical History:  Procedure Laterality Date  . ABDOMINAL HYSTERECTOMY  1982   tubes and womb, bleeding and ectopic  . ABDOMINAL SURGERY     removal of tumors  . BREAST SURGERY Left 1983   benign tumor  . CATARACT EXTRACTION W/PHACO Right 01/11/2014   Procedure: CATARACT EXTRACTION PHACO AND INTRAOCULAR LENS PLACEMENT (IOC);  Surgeon: Elta Guadeloupe T. Gershon Crane, MD;  Location: [facility];  Service: Ophthalmology;  Laterality: Right;  CDE 5.57  . CATARACT EXTRACTION  W/PHACO Left 01/25/2014   Procedure: CATARACT EXTRACTION PHACO AND INTRAOCULAR LENS PLACEMENT (IOC);  Surgeon: Elta Guadeloupe T. Gershon Crane, MD;  Location: [facility];  Service: Ophthalmology;  Laterality: Left;  CDE:5.19  . COLONOSCOPY  2007 [facility]   NL EXAM  . COLONOSCOPY N/A 04/22/2014   Dr. Fields:Normal mucosa in the terminal ileum/Two COLON polyps REMOVED/ Mild diverticulosis in the ascending colon and transverse colon/The LEFT colon IS redundant/Small internal hemorrhoids. Path: tubular adenoma. Next colonoscopy in 5-10 years  . COLONOSCOPY WITH PROPOFOL N/A 11/27/2020   Procedure: COLONOSCOPY WITH PROPOFOL;  Surgeon: [name], DO;  [facility];  Service: Endoscopy;  Laterality: N/A;  10:00  . ESOPHAGOGASTRODUODENOSCOPY N/A 04/22/2014   Dr. Fields:MICROCYTIC ANEMIA MOST LIKELY DUE TO ASA/VOLTAREN/Small hiatal hernia/MODERATE Non-erosive gastritis. Negative H.pylori  . LEFT HEART CATH AND CORONARY ANGIOGRAPHY N/A 06/23/2018   Procedure: LEFT HEART CATH AND CORONARY ANGIOGRAPHY;  Surgeon: Jettie Booze, MD;  Location: [place] [facility];  Service: Cardiovascular;  Laterality: N/A;  . POLYPECTOMY  11/27/2020   Procedure: POLYPECTOMY;  Surgeon: [name], DO;  [facility];  Service: Endoscopy;;  . 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 ULTRASOUND GUIDANCE FOR VASCULAR ACCESS  06/23/2018   Procedure: Ultrasound Guidance For Vascular Access;  Surgeon: Jettie Booze, MD;  Location: [place] [facility];  Service: Cardiovascular;;  . UPPER GASTROINTESTINAL ENDOSCOPY  2007 CHEST PAIN   NL EXAM  .  YAG LASER APPLICATION Right 1/61/0960   Procedure: YAG LASER APPLICATION;  Surgeon: Elta Guadeloupe T. Gershon Crane, MD;  [facility]: [facility];  Service: Ophthalmology;  Laterality: Right;  . YAG LASER APPLICATION Left 4/54/0981   Procedure: YAG LASER APPLICATION;  Surgeon: Rutherford Guys, MD;  Location: [facility];  Service: Ophthalmology;  Laterality: Left;    Current Outpatient Medications   Medication Sig Dispense Refill  . alprazolam (XANAX) 2 MG tablet Take one tablet by mouth three times weekly, as needed, for anxiety and panic 12 tablet 1  . amLODipine (NORVASC) 10 MG tablet TAKE 1 TABLET EVERY DAY 90 tablet 0  . Ascorbic Acid (VITAMIN C) 500 MG CAPS Take 500 mg by mouth daily.     . benazepril-hydrochlorthiazide (LOTENSIN HCT) 20-12.5 MG tablet TAKE 2 TABLETS EVERY DAY 180 tablet 0  . Black Currant Seed Oil 500 MG CAPS Take 500 mg by mouth daily. 5 ml liquid    . busPIRone (BUSPAR) 7.5 MG tablet TAKE 1 TABLET THREE TIMES DAILY (DOSE INCREASE) 270 tablet 3  . chlorthalidone (HYGROTON) 25 MG tablet Take 1 tablet (25 mg total) by mouth daily. 30 tablet 2  . Cholecalciferol (VITAMIN D3) 250 MCG (10000 UT) capsule Take 10,000 Units by mouth daily.    . cyclobenzaprine (FLEXERIL) 5 MG tablet Take 1 tablet (5 mg total) by mouth 3 (three) times daily as needed for muscle spasms. 30 tablet 1  . FLUoxetine (PROZAC) 40 MG capsule TAKE 1 CAPSULE EVERY DAY 90 capsule 0  . gabapentin (NEURONTIN) 100 MG capsule TAKE 1 CAPSULE THREE TIMES DAILY 270 capsule 0  . ibuprofen (ADVIL) 600 MG tablet Take 1 tablet (600 mg total) by mouth every 8 (eight) hours as needed for headache, mild pain or moderate pain. 30 tablet 0  . Krill Oil (OMEGA-3) 500 MG CAPS Take 500 mg by mouth daily.     . Multiple Vitamin (MULTIVITAMIN WITH MINERALS) TABS tablet Take 1 tablet by mouth daily.    Marland Kitchen omeprazole (PRILOSEC) 40 MG capsule Take 1 capsule (40 mg total) by mouth daily. 90 capsule 0  . pravastatin (PRAVACHOL) 40 MG tablet TAKE 1 TABLET EVERY EVENING 90 tablet 0  . buPROPion (WELLBUTRIN XL) 150 MG 24 hr tablet Take 1 tablet (150 mg total) by mouth daily. (Patient not taking: Reported on 01/31/2021) 30 tablet 2  . famotidine (PEPCID) 20 MG tablet Take 1 tablet (20 mg total) by mouth 2 (two) times daily. (Patient not taking: Reported on 01/31/2021) 30 tablet 0   No current facility-administered medications for  this visit.    Allergies as of 01/31/2021  . (No Known Allergies)    Family History  Problem Relation Age of Onset  . Colon polyps Sister 64  . Diabetes Sister   . Hypertension Sister   . Kidney disease Sister   . Arthritis Father   . Cancer Father        prostate   . Hypertension Father   . Diabetes Sister   . Hypertension Sister   . CAD Paternal Grandmother   . Colon cancer Neg Hx     Social History   Socioeconomic History  . Marital status: Married    Spouse name: Not on file  . Number of children: Not on file  . Years of education: Not on file  . Highest education level: Not on file  Occupational History  . Not on file  Tobacco Use  . Smoking status: Former Smoker    Packs/day: 0.25  Years: 20.00    Pack years: 5.00    Types: Cigarettes    Quit date: 01/05/1990    Years since quitting: 31.0  . Smokeless tobacco: Current User    Types: Snuff  Vaping Use  . Vaping Use: Never used  Substance and Sexual Activity  . Alcohol use: No  . Drug use: Yes    Types: Marijuana  . Sexual activity: Not Currently    Birth control/protection: Surgical  Other Topics Concern  . Not on file  Social History Narrative  . Not on file   Social Determinants of Health   Financial Resource Strain: Low Risk   . Difficulty of Paying Living Expenses: Not hard at all  Food Insecurity: No Food Insecurity  . Worried About Charity fundraiser in the Last Year: Never true  . Ran Out of Food in the Last Year: Never true  Transportation Needs: No Transportation Needs  . Lack of Transportation (Medical): No  . Lack of Transportation (Non-Medical): No  Physical Activity: Insufficiently Active  . Days of Exercise per Week: 3 days  . Minutes of Exercise per Session: 30 min  Stress: No Stress Concern Present  . Feeling of Stress : Only a little  Social Connections: Moderately Integrated  . Frequency of Communication with Friends and Family: More than three times a week  . Frequency  of Social Gatherings with Friends and Family: Twice a week  . Attends Religious Services: 1 to 4 times per year  . Active Member of Clubs or Organizations: No  . Attends Archivist Meetings: Never  . Marital Status: Married    Subjective: Review of Systems  Constitutional: Negative for chills and fever.  HENT: Negative for congestion and hearing loss.   Eyes: Negative for blurred vision and double vision.  Respiratory: Negative for cough and shortness of breath.   Cardiovascular: Negative for chest pain and palpitations.  Gastrointestinal: Positive for heartburn. Negative for abdominal pain, blood in stool, constipation, diarrhea, melena and vomiting.       Dysphagia  Genitourinary: Negative for dysuria and urgency.  Musculoskeletal: Negative for joint pain and myalgias.  Skin: Negative for itching and rash.  Neurological: Negative for dizziness and headaches.  Psychiatric/Behavioral: Negative for depression. The patient is not nervous/anxious.   All other systems reviewed and are negative.    Objective: BP 130/77   Pulse 100   Temp (!) 96.6 F (35.9 C) (Temporal)   Ht 5\' 9"  (1.753 m)   Wt 193 lb 12.8 oz (87.9 kg)   BMI 28.62 kg/m  Physical Exam Constitutional:      Appearance: Normal appearance.  HENT:     Head: Normocephalic and atraumatic.  Eyes:     Extraocular Movements: Extraocular movements intact.     Conjunctiva/sclera: Conjunctivae normal.  Cardiovascular:     Rate and Rhythm: Normal rate and regular rhythm.  Pulmonary:     Effort: Pulmonary effort is normal.     Breath sounds: Normal breath sounds.  Abdominal:     General: Bowel sounds are normal.     Palpations: Abdomen is soft.  Musculoskeletal:        General: Normal range of motion.     Cervical back: Normal range of motion and neck supple.  Skin:    General: Skin is warm.  Neurological:     General: No focal deficit present.     Mental Status: She is alert and oriented to person,  place, and time.  Psychiatric:  Mood and Affect: Mood normal.        Behavior: Behavior normal.      Assessment: *Chronic GERD-breakthrough symptoms *Dysphagia-new, worsening *Adenomatous colon polyp  Plan: Will schedule for EGD with potential dilation to evaluate for peptic ulcer disease, esophagitis, gastritis, H. Pylori, duodenitis, or other. Will also evaluate for esophageal stricture, Schatzki's ring, esophageal web or other.   The risks including infection, bleed, or perforation as well as benefits, limitations, alternatives and imponderables have been reviewed with the patient. Potential for esophageal dilation, biopsy, etc. have also been reviewed.  Questions have been answered. All parties agreeable.  Continue on omeprazole 40 mg daily for now.  We may need to adjust this depending on endoscopic findings.  Colonoscopy recall January 2027  01/31/2021 9:37 AM   Disclaimer: This note was dictated with voice recognition software. Similar sounding words can inadvertently be transcribed and may not be corrected upon review.

## 2021-01-31 NOTE — H&P (View-Only) (Signed)
Referring Provider: Fayrene Helper, MD Primary Care Physician:  Fayrene Helper, MD Primary GI:  Dr. Abbey Chatters  Chief Complaint  Patient presents with  . Gastroesophageal Reflux    Food getting stuck, V8 juice helps    HPI:   Sophia [name] is a 70 y.o. adult who presents to the clinic today for follow-up visit.  Recently had a colonoscopy in January which were showed 1 tubular adenoma.  Colonoscopy recall 5 years.  She presents today to discuss worsening reflux.  She states she has had progressively worsening heartburn and reflux.  Previously on famotidine with breakthrough symptoms.  Recently switched to omeprazole 40 mg daily approximately 1 week ago.  Has not noticed any difference as of yet.  Does take ibuprofen 600 mg 3 times a day for chronic arthritis.  Also notes progressively worsening dysphagia primarily with foods getting stuck in her substernal region.  Underwent EGD in 2015 with H. pylori negative gastritis and small hiatal hernia  Past Medical History:  Diagnosis Date  . Acute respiratory disease due to COVID-19 virus 08/28/2019  . Anxiety   . Arthritis   . Depression   . Diabetes mellitus type 2 in obese [facility]) 11/28/2010   Qualifier: Diagnosis of  By: Moshe Cipro MD, Margaret  Diet controlled in 12/2016   . Diabetes mellitus without complication ([place])   . GERD (gastroesophageal reflux disease)   . Hypercholesteremia   . Hypertension   . PONV (postoperative nausea and vomiting)   . Sleep apnea     Past Surgical History:  Procedure Laterality Date  . ABDOMINAL HYSTERECTOMY  1982   tubes and womb, bleeding and ectopic  . ABDOMINAL SURGERY     removal of tumors  . BREAST SURGERY Left 1983   benign tumor  . CATARACT EXTRACTION W/PHACO Right 01/11/2014   Procedure: CATARACT EXTRACTION PHACO AND INTRAOCULAR LENS PLACEMENT (IOC);  Surgeon: Elta Guadeloupe T. Gershon Crane, MD;  Location: [facility];  Service: Ophthalmology;  Laterality: Right;  CDE 5.57  . CATARACT EXTRACTION  W/PHACO Left 01/25/2014   Procedure: CATARACT EXTRACTION PHACO AND INTRAOCULAR LENS PLACEMENT (IOC);  Surgeon: Elta Guadeloupe T. Gershon Crane, MD;  [facility]: [facility];  Service: Ophthalmology;  Laterality: Left;  CDE:5.19  . COLONOSCOPY  2007 [facility]   NL EXAM  . COLONOSCOPY N/A 04/22/2014   Dr. Fields:Normal mucosa in the terminal ileum/Two COLON polyps REMOVED/ Mild diverticulosis in the ascending colon and transverse colon/The LEFT colon IS redundant/Small internal hemorrhoids. Path: tubular adenoma. Next colonoscopy in 5-10 years  . COLONOSCOPY WITH PROPOFOL N/A 11/27/2020   Procedure: COLONOSCOPY WITH PROPOFOL;  Surgeon: [name], DO;  [facility];  Service: Endoscopy;  Laterality: N/A;  10:00  . ESOPHAGOGASTRODUODENOSCOPY N/A 04/22/2014   Dr. Fields:MICROCYTIC ANEMIA MOST LIKELY DUE TO ASA/VOLTAREN/Small hiatal hernia/MODERATE Non-erosive gastritis. Negative H.pylori  . LEFT HEART CATH AND CORONARY ANGIOGRAPHY N/A 06/23/2018   Procedure: LEFT HEART CATH AND CORONARY ANGIOGRAPHY;  Surgeon: Jettie Booze, MD;  Location: [place] [facility];  Service: Cardiovascular;  Laterality: N/A;  . POLYPECTOMY  11/27/2020   Procedure: POLYPECTOMY;  Surgeon: [name], DO;  [facility];  Service: Endoscopy;;  . 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 ULTRASOUND GUIDANCE FOR VASCULAR ACCESS  06/23/2018   Procedure: Ultrasound Guidance For Vascular Access;  Surgeon: Jettie Booze, MD;  Location: [place] [facility];  Service: Cardiovascular;;  . UPPER GASTROINTESTINAL ENDOSCOPY  2007 CHEST PAIN   NL EXAM  .  YAG LASER APPLICATION Right 8/33/8250   Procedure: YAG LASER APPLICATION;  Surgeon: Elta Guadeloupe T. Gershon Crane, MD;  Location: [facility];  Service: Ophthalmology;  Laterality: Right;  . YAG LASER APPLICATION Left 5/39/7673   Procedure: YAG LASER APPLICATION;  Surgeon: Rutherford Guys, MD;  Location: [facility];  Service: Ophthalmology;  Laterality: Left;    Current Outpatient Medications   Medication Sig Dispense Refill  . alprazolam (XANAX) 2 MG tablet Take one tablet by mouth three times weekly, as needed, for anxiety and panic 12 tablet 1  . amLODipine (NORVASC) 10 MG tablet TAKE 1 TABLET EVERY DAY 90 tablet 0  . Ascorbic Acid (VITAMIN C) 500 MG CAPS Take 500 mg by mouth daily.     . benazepril-hydrochlorthiazide (LOTENSIN HCT) 20-12.5 MG tablet TAKE 2 TABLETS EVERY DAY 180 tablet 0  . Black Currant Seed Oil 500 MG CAPS Take 500 mg by mouth daily. 5 ml liquid    . busPIRone (BUSPAR) 7.5 MG tablet TAKE 1 TABLET THREE TIMES DAILY (DOSE INCREASE) 270 tablet 3  . chlorthalidone (HYGROTON) 25 MG tablet Take 1 tablet (25 mg total) by mouth daily. 30 tablet 2  . Cholecalciferol (VITAMIN D3) 250 MCG (10000 UT) capsule Take 10,000 Units by mouth daily.    . cyclobenzaprine (FLEXERIL) 5 MG tablet Take 1 tablet (5 mg total) by mouth 3 (three) times daily as needed for muscle spasms. 30 tablet 1  . FLUoxetine (PROZAC) 40 MG capsule TAKE 1 CAPSULE EVERY DAY 90 capsule 0  . gabapentin (NEURONTIN) 100 MG capsule TAKE 1 CAPSULE THREE TIMES DAILY 270 capsule 0  . ibuprofen (ADVIL) 600 MG tablet Take 1 tablet (600 mg total) by mouth every 8 (eight) hours as needed for headache, mild pain or moderate pain. 30 tablet 0  . Krill Oil (OMEGA-3) 500 MG CAPS Take 500 mg by mouth daily.     . Multiple Vitamin (MULTIVITAMIN WITH MINERALS) TABS tablet Take 1 tablet by mouth daily.    Marland Kitchen omeprazole (PRILOSEC) 40 MG capsule Take 1 capsule (40 mg total) by mouth daily. 90 capsule 0  . pravastatin (PRAVACHOL) 40 MG tablet TAKE 1 TABLET EVERY EVENING 90 tablet 0  . buPROPion (WELLBUTRIN XL) 150 MG 24 hr tablet Take 1 tablet (150 mg total) by mouth daily. (Patient not taking: Reported on 01/31/2021) 30 tablet 2  . famotidine (PEPCID) 20 MG tablet Take 1 tablet (20 mg total) by mouth 2 (two) times daily. (Patient not taking: Reported on 01/31/2021) 30 tablet 0   No current facility-administered medications for  this visit.    Allergies as of 01/31/2021  . (No Known Allergies)    Family History  Problem Relation Age of Onset  . Colon polyps Sister 16  . Diabetes Sister   . Hypertension Sister   . Kidney disease Sister   . Arthritis Father   . Cancer Father        prostate   . Hypertension Father   . Diabetes Sister   . Hypertension Sister   . CAD Paternal Grandmother   . Colon cancer Neg Hx     Social History   Socioeconomic History  . Marital status: Married    Spouse name: Not on file  . Number of children: Not on file  . Years of education: Not on file  . Highest education level: Not on file  Occupational History  . Not on file  Tobacco Use  . Smoking status: Former Smoker    Packs/day: 0.25  Years: 20.00    Pack years: 5.00    Types: Cigarettes    Quit date: 01/05/1990    Years since quitting: 31.0  . Smokeless tobacco: Current User    Types: Snuff  Vaping Use  . Vaping Use: Never used  Substance and Sexual Activity  . Alcohol use: No  . Drug use: Yes    Types: Marijuana  . Sexual activity: Not Currently    Birth control/protection: Surgical  Other Topics Concern  . Not on file  Social History Narrative  . Not on file   Social Determinants of Health   Financial Resource Strain: Low Risk   . Difficulty of Paying Living Expenses: Not hard at all  Food Insecurity: No Food Insecurity  . Worried About Charity fundraiser in the Last Year: Never true  . Ran Out of Food in the Last Year: Never true  Transportation Needs: No Transportation Needs  . Lack of Transportation (Medical): No  . Lack of Transportation (Non-Medical): No  Physical Activity: Insufficiently Active  . Days of Exercise per Week: 3 days  . Minutes of Exercise per Session: 30 min  Stress: No Stress Concern Present  . Feeling of Stress : Only a little  Social Connections: Moderately Integrated  . Frequency of Communication with Friends and Family: More than three times a week  . Frequency  of Social Gatherings with Friends and Family: Twice a week  . Attends Religious Services: 1 to 4 times per year  . Active Member of Clubs or Organizations: No  . Attends Archivist Meetings: Never  . Marital Status: Married    Subjective: Review of Systems  Constitutional: Negative for chills and fever.  HENT: Negative for congestion and hearing loss.   Eyes: Negative for blurred vision and double vision.  Respiratory: Negative for cough and shortness of breath.   Cardiovascular: Negative for chest pain and palpitations.  Gastrointestinal: Positive for heartburn. Negative for abdominal pain, blood in stool, constipation, diarrhea, melena and vomiting.       Dysphagia  Genitourinary: Negative for dysuria and urgency.  Musculoskeletal: Negative for joint pain and myalgias.  Skin: Negative for itching and rash.  Neurological: Negative for dizziness and headaches.  Psychiatric/Behavioral: Negative for depression. The patient is not nervous/anxious.   All other systems reviewed and are negative.    Objective: BP 130/77   Pulse 100   Temp (!) 96.6 F (35.9 C) (Temporal)   Ht 5\' 9"  (1.753 m)   Wt 193 lb 12.8 oz (87.9 kg)   BMI 28.62 kg/m  Physical Exam Constitutional:      Appearance: Normal appearance.  HENT:     Head: Normocephalic and atraumatic.  Eyes:     Extraocular Movements: Extraocular movements intact.     Conjunctiva/sclera: Conjunctivae normal.  Cardiovascular:     Rate and Rhythm: Normal rate and regular rhythm.  Pulmonary:     Effort: Pulmonary effort is normal.     Breath sounds: Normal breath sounds.  Abdominal:     General: Bowel sounds are normal.     Palpations: Abdomen is soft.  Musculoskeletal:        General: Normal range of motion.     Cervical back: Normal range of motion and neck supple.  Skin:    General: Skin is warm.  Neurological:     General: No focal deficit present.     Mental Status: She is alert and oriented to person,  place, and time.  Psychiatric:  Mood and Affect: Mood normal.        Behavior: Behavior normal.      Assessment: *Chronic GERD-breakthrough symptoms *Dysphagia-new, worsening *Adenomatous colon polyp  Plan: Will schedule for EGD with potential dilation to evaluate for peptic ulcer disease, esophagitis, gastritis, H. Pylori, duodenitis, or other. Will also evaluate for esophageal stricture, Schatzki's ring, esophageal web or other.   The risks including infection, bleed, or perforation as well as benefits, limitations, alternatives and imponderables have been reviewed with the patient. Potential for esophageal dilation, biopsy, etc. have also been reviewed.  Questions have been answered. All parties agreeable.  Continue on omeprazole 40 mg daily for now.  We may need to adjust this depending on endoscopic findings.  Colonoscopy recall January 2027  01/31/2021 9:37 AM   Disclaimer: This note was dictated with voice recognition software. Similar sounding words can inadvertently be transcribed and may not be corrected upon review.

## 2021-01-31 NOTE — Telephone Encounter (Signed)
PA for EGD submitted via [employer] website. [employer]# [id number], valid 02/16/21-03/18/21.

## 2021-02-05 ENCOUNTER — Other Ambulatory Visit: Payer: Self-pay | Admitting: Family Medicine

## 2021-02-09 ENCOUNTER — Encounter (HOSPITAL_COMMUNITY): Payer: Self-pay

## 2021-02-14 ENCOUNTER — Other Ambulatory Visit: Payer: Self-pay

## 2021-02-14 ENCOUNTER — Other Ambulatory Visit (HOSPITAL_COMMUNITY)
Admission: RE | Admit: 2021-02-14 | Discharge: 2021-02-14 | Disposition: A | Discharge status: Home / Self Care | Payer: Medicare PPO | Source: Ambulatory Visit | Attending: Internal Medicine | Admitting: Internal Medicine

## 2021-02-14 DIAGNOSIS — Z01812 Encounter for preprocedural laboratory examination: Secondary | ICD-10-CM | POA: Diagnosis not present

## 2021-02-14 DIAGNOSIS — Z20822 Contact with and (suspected) exposure to covid-19: Secondary | ICD-10-CM | POA: Insufficient documentation

## 2021-02-14 LAB — BASIC METABOLIC PANEL
Anion gap: 10 (ref 5–15)
BUN: 28 mg/dL — ABNORMAL HIGH (ref 8–23)
CO2: 27 mmol/L (ref 22–32)
Calcium: 9.9 mg/dL (ref 8.9–10.3)
Chloride: 102 mmol/L (ref 98–111)
Creatinine, Ser: 0.92 mg/dL (ref 0.44–1.00)
GFR, Estimated: >60 mL/min (ref >60)
Glucose, Bld: 87 mg/dL (ref 70–99)
Potassium: 3.6 mmol/L (ref 3.5–5.1)
Sodium: 139 mmol/L (ref 135–145)

## 2021-02-14 LAB — SARS CORONAVIRUS 2 (TAT 6-24 HRS): SARS Coronavirus 2: NEGATIVE

## 2021-02-16 ENCOUNTER — Encounter (HOSPITAL_COMMUNITY)
Admission: RE | Disposition: A | Discharge status: Home / Self Care | Payer: Self-pay | Source: Ambulatory Visit | Attending: Internal Medicine

## 2021-02-16 ENCOUNTER — Encounter (HOSPITAL_COMMUNITY): Payer: Self-pay

## 2021-02-16 ENCOUNTER — Ambulatory Visit (HOSPITAL_COMMUNITY): Payer: Medicare PPO | Admitting: Anesthesiology

## 2021-02-16 ENCOUNTER — Ambulatory Visit (HOSPITAL_COMMUNITY)
Admission: RE | Admit: 2021-02-16 | Discharge: 2021-02-16 | Disposition: A | Discharge status: Home / Self Care | Payer: Medicare PPO | Source: Ambulatory Visit | Attending: Internal Medicine | Admitting: Internal Medicine

## 2021-02-16 ENCOUNTER — Other Ambulatory Visit: Payer: Self-pay

## 2021-02-16 DIAGNOSIS — Z87891 Personal history of nicotine dependence: Secondary | ICD-10-CM | POA: Diagnosis not present

## 2021-02-16 DIAGNOSIS — Z79899 Other long term (current) drug therapy: Secondary | ICD-10-CM | POA: Insufficient documentation

## 2021-02-16 DIAGNOSIS — K2970 Gastritis, unspecified, without bleeding: Secondary | ICD-10-CM | POA: Insufficient documentation

## 2021-02-16 DIAGNOSIS — R1310 Dysphagia, unspecified: Secondary | ICD-10-CM | POA: Diagnosis not present

## 2021-02-16 DIAGNOSIS — K222 Esophageal obstruction: Secondary | ICD-10-CM | POA: Diagnosis not present

## 2021-02-16 DIAGNOSIS — K219 Gastro-esophageal reflux disease without esophagitis: Secondary | ICD-10-CM | POA: Insufficient documentation

## 2021-02-16 DIAGNOSIS — G4730 Sleep apnea, unspecified: Secondary | ICD-10-CM | POA: Diagnosis not present

## 2021-02-16 HISTORY — PX: BIOPSY: SHX5522

## 2021-02-16 HISTORY — PX: ESOPHAGOGASTRODUODENOSCOPY (EGD) WITH PROPOFOL: SHX5813

## 2021-02-16 HISTORY — PX: BALLOON DILATION: SHX5330

## 2021-02-16 LAB — GLUCOSE, CAPILLARY: Glucose-Capillary: 87 mg/dL (ref 70–99)

## 2021-02-16 SURGERY — ESOPHAGOGASTRODUODENOSCOPY (EGD) WITH PROPOFOL
Anesthesia: General

## 2021-02-16 MED ORDER — PROPOFOL 10 MG/ML IV BOLUS
INTRAVENOUS | Status: DC | PRN
  Administered 2021-02-16: 30 mg via INTRAVENOUS
  Administered 2021-02-16: 20 mg via INTRAVENOUS
  Administered 2021-02-16: 120 mg via INTRAVENOUS

## 2021-02-16 MED ORDER — LACTATED RINGERS IV SOLN
INTRAVENOUS | Status: DC
  Administered 2021-02-16: 1000 mL via INTRAVENOUS

## 2021-02-16 MED ORDER — LIDOCAINE HCL (CARDIAC) PF 100 MG/5ML IV SOSY
PREFILLED_SYRINGE | INTRAVENOUS | Status: DC | PRN
  Administered 2021-02-16: 50 mg via INTRAVENOUS

## 2021-02-16 NOTE — Discharge Instructions (Addendum)
EGD Discharge instructions Please read the instructions outlined below and refer to this sheet in the next few weeks. These discharge instructions provide you with general information on caring for yourself after you leave the hospital. Your doctor may also give you specific instructions. While your treatment has been planned according to the most current medical practices available, unavoidable complications occasionally occur. If you have any problems or questions after discharge, please call your doctor. ACTIVITY  You may resume your regular activity but move at a slower pace for the next 24 hours.   Take frequent rest periods for the next 24 hours.   Walking will help expel (get rid of) the air and reduce the bloated feeling in your abdomen.   No driving for 24 hours (because of the anesthesia (medicine) used during the test).   You may shower.   Do not sign any important legal documents or operate any machinery for 24 hours (because of the anesthesia used during the test).  NUTRITION  Drink plenty of fluids.   You may resume your normal diet.   Begin with a light meal and progress to your normal diet.   Avoid alcoholic beverages for 24 hours or as instructed by your caregiver.  MEDICATIONS  You may resume your normal medications unless your caregiver tells you otherwise.  WHAT YOU CAN EXPECT TODAY  You may experience abdominal discomfort such as a feeling of fullness or "gas" pains.  FOLLOW-UP  Your doctor will discuss the results of your test with you.  SEEK IMMEDIATE MEDICAL ATTENTION IF ANY OF THE FOLLOWING OCCUR:  Excessive nausea (feeling sick to your stomach) and/or vomiting.   Severe abdominal pain and distention (swelling).   Trouble swallowing.   Temperature over 101 F (37.8 C).   Rectal bleeding or vomiting of blood.    Your EGD showed a mild amount of inflammation in your stomach.  I took biopsies of this to rule out infection with a bacteria called H.  pylori.  You did have a slight narrowing of your esophagus so I started with the balloon.  Hopefully this helps with your swallowing.  Continue on omeprazole daily.  Await pathology results, my office follow-up with GI in 2 to 3 months  I hope you have a great rest of your week!  Elon Alas. Abbey Chatters, D.O. Gastroenterology and Hepatology [facility]   [email or web address]">  Esophageal Dilatation Esophageal dilatation, also called esophageal dilation, is a procedure to widen or open a blocked or narrowed part of the esophagus. The esophagus is the part of the body that moves food and liquid from the mouth to the stomach. You may need this procedure if:  You have a buildup of scar tissue in your esophagus that makes it difficult, painful, or impossible to swallow. This can be caused by gastroesophageal reflux disease (GERD).  You have cancer of the esophagus.  There is a problem with how food moves through your esophagus. In some cases, you may need this procedure repeated at a later time to dilate the esophagus gradually. Tell a health care provider about:  Any allergies you have.  All medicines you are taking, including vitamins, herbs, eye drops, creams, and over-the-counter medicines.  Any problems you or family members have had with anesthetic medicines.  Any blood disorders you have.  Any surgeries you have had.  Any medical conditions you have.  Any antibiotic medicines you are required to take before dental procedures.  Whether you are pregnant or may be pregnant.  What are the risks? Generally, this is a safe procedure. However, problems may occur, including:  Bleeding due to a tear in the lining of the esophagus.  A hole, or perforation, in the esophagus. What happens before the procedure?  Ask your health care provider about: ? Changing or stopping your regular  medicines. This is especially important if you are taking diabetes medicines or blood thinners. ? Taking medicines such as aspirin and ibuprofen. These medicines can thin your blood. Do not take these medicines unless your health care provider tells you to take them. ? Taking over-the-counter medicines, vitamins, herbs, and supplements.  Follow instructions from your health care provider about eating or drinking restrictions.  Plan to have a responsible adult take you home from the hospital or clinic.  Plan to have a responsible adult care for you for the time you are told after you leave the hospital or clinic. This is important. What happens during the procedure?  You may be given a medicine to help you relax (sedative).  A numbing medicine may be sprayed into the back of your throat, or you may gargle the medicine.  Your health care provider may perform the dilatation using various surgical instruments, such as: ? Simple dilators. This instrument is carefully placed in the esophagus to stretch it. ? Guided wire bougies. This involves using an endoscope to insert a wire into the esophagus. A dilator is passed over this wire to enlarge the esophagus. Then the wire is removed. ? Balloon dilators. An endoscope with a small balloon is inserted into the esophagus. The balloon is inflated to stretch the esophagus and open it up. The procedure may vary among health care providers and hospitals. What can I expect after the procedure?  Your blood pressure, heart rate, breathing rate, and blood oxygen level will be monitored until you leave the hospital or clinic.  Your throat may feel slightly sore and numb. This will get better over time.  You will not be allowed to eat or drink until your throat is no longer numb.  When you are able to drink, urinate, and sit on the edge of the bed without nausea or dizziness, you may be able to return home. Follow these instructions at home:  Take  over-the-counter and prescription medicines only as told by your health care provider.  If you were given a sedative during the procedure, it can affect you for several hours. Do not drive or operate machinery until your health care provider says that it is safe.  Plan to have a responsible adult care for you for the time you are told. This is important.  Follow instructions from your health care provider about any eating or drinking restrictions.  Do not use any products that contain nicotine or tobacco, such as cigarettes, e-cigarettes, and chewing tobacco. If you need help quitting, ask your health care provider.  Keep all follow-up visits. This is important. Contact a health care provider if:  You have a fever.  You have pain that is not relieved by medicine. Get help right away if:  You have chest pain.  You have trouble breathing.  You have trouble swallowing.  You vomit blood.  You have black, tarry, or bloody stools. These symptoms may represent a serious problem that is an emergency. Do not wait to see if the symptoms will go away. Get medical help right away. Call your local emergency services (911 in the [place] Do not drive yourself to the hospital. Summary  Esophageal dilatation, also called esophageal dilation, is a procedure to widen or open a blocked or narrowed part of the esophagus.  Plan to have a responsible adult take you home from the hospital or clinic.  For this procedure, a numbing medicine may be sprayed into the back of your throat, or you may gargle the medicine.  Do not drive or operate machinery until your health care provider says that it is safe. This information is not intended to replace advice given to you by your health care provider. Make sure you discuss any questions you have with your health care provider. Document Revised: 03/22/2020 Document Reviewed: 03/22/2020 Elsevier Patient Education  [place].

## 2021-02-16 NOTE — Interval H&P Note (Signed)
History and Physical Interval Note:  02/16/2021 11:22 AM  Sophia [name] Right  has presented today for surgery, with the diagnosis of GERD, dysphagia.  The various methods of treatment have been discussed with the patient and family. After consideration of risks, benefits and other options for treatment, the patient has consented to  Procedure(s) with comments: ESOPHAGOGASTRODUODENOSCOPY (EGD) WITH PROPOFOL (N/A) - PM BALLOON DILATION (N/A) as a surgical intervention.  The patient's history has been reviewed, patient examined, no change in status, stable for surgery.  I have reviewed the patient's chart and labs.  Questions were answered to the patient's satisfaction.     [name]

## 2021-02-16 NOTE — Anesthesia Postprocedure Evaluation (Signed)
Anesthesia Post Note  Patient: Sophia [name]  Procedure(s) Performed: ESOPHAGOGASTRODUODENOSCOPY (EGD) WITH PROPOFOL (N/A ) BALLOON DILATION (N/A ) BIOPSY  Patient location during evaluation: Phase II Anesthesia Type: General Level of consciousness: awake and alert Pain management: satisfactory to patient Vital Signs Assessment: post-procedure vital signs reviewed and stable Respiratory status: spontaneous breathing and respiratory function stable Cardiovascular status: blood pressure returned to baseline and stable Postop Assessment: no apparent nausea or vomiting Anesthetic complications: no   No complications documented.   Last Vitals:  Vitals:   02/16/21 0918 02/16/21 1145  BP: 123/71 122/62  Pulse: 64 63  Resp: 14 18  Temp: 36.6 C 37.1 C  SpO2: 98% 100%    Last Pain:  Vitals:   02/16/21 1145  TempSrc: Oral  PainSc: 0-No pain                 Karna Dupes

## 2021-02-16 NOTE — Transfer of Care (Signed)
Immediate Anesthesia Transfer of Care Note  Patient: Sophia [name]  Procedure(s) Performed: ESOPHAGOGASTRODUODENOSCOPY (EGD) WITH PROPOFOL (N/A ) BALLOON DILATION (N/A ) BIOPSY  Patient Location: PACU  Anesthesia Type:General  Level of Consciousness: awake and alert   Airway & Oxygen Therapy: Patient Spontanous Breathing  Post-op Assessment: Report given to RN and Post -op Vital signs reviewed and stable  Post vital signs: Reviewed and stable  Last Vitals:  Vitals Value Taken Time  BP    Temp    Pulse 59   Resp    SpO2 99     Last Pain:  Vitals:   02/16/21 1133  TempSrc:   PainSc: 0-No pain      Patients Stated Pain Goal: 9 (50/15/86 8257)  Complications: No complications documented.

## 2021-02-16 NOTE — Op Note (Signed)
[facility] Patient Name: Sophia [name] Procedure Date: 02/16/2021 11:13 AM MRN: [id number] Date of Birth: 04/08/1951 Attending MD: Elon Alas. Abbey Chatters DO CSN: [id number] Age: 70 Admit Type: Outpatient Procedure:                Upper GI endoscopy Indications:              Dysphagia Providers:                Elon Alas. Abbey Chatters, DO, Janeece Riggers, RN, Aram Candela Referring MD:              Medicines:                See the Anesthesia note for documentation of the                            administered medications Complications:            No immediate complications. Estimated Blood Loss:     Estimated blood loss was minimal. Procedure:                Pre-Anesthesia Assessment:                           - The anesthesia plan was to use monitored                            anesthesia care (MAC).                           After obtaining informed consent, the endoscope was                            passed under direct vision. Throughout the                            procedure, the patient's blood pressure, pulse, and                            oxygen saturations were monitored continuously. The                            GIF-H190 ([id number]) scope was introduced through the                            mouth, and advanced to the second part of duodenum.                            The upper GI endoscopy was accomplished without                            difficulty. The patient tolerated the procedure                            well. Scope In: 11:36:06 AM Scope Out: 11:41:47 AM Total Procedure Duration: 0 hours 5 minutes 41 seconds  Findings:      One benign-appearing, intrinsic mild stenosis was found in the lower  third of the esophagus. The stenosis was traversed. A TTS dilator was       passed through the scope. Dilation with an 18-19-20 mm balloon dilator       was performed to 20 mm. The dilation site was examined and showed       moderate improvement in luminal narrowing.       Diffuse mild inflammation characterized by erythema was found in the       entire examined stomach. Biopsies were taken with a cold forceps for       Helicobacter pylori testing.      The duodenal bulb, first portion of the duodenum and second portion of       the duodenum were normal. Impression:               - Benign-appearing esophageal stenosis. Dilated.                           - Gastritis. Biopsied.                           - Normal duodenal bulb, first portion of the                            duodenum and second portion of the duodenum. Moderate Sedation:      Per Anesthesia Care Recommendation:           - Patient has a contact number available for                            emergencies. The signs and symptoms of potential                            delayed complications were discussed with the                            patient. Return to normal activities tomorrow.                            Written discharge instructions were provided to the                            patient.                           - Resume previous diet.                           - Continue present medications.                           - Await pathology results.                           - Repeat upper endoscopy PRN for retreatment.                           - Return to GI clinic in 3 months.                           -  Use a proton pump inhibitor PO daily. Procedure Code(s):        --- Professional ---                           [phone], Esophagogastroduodenoscopy, flexible,                            transoral; with transendoscopic balloon dilation of                            esophagus (less than 30 mm diameter)                           43239, 59, Esophagogastroduodenoscopy, flexible,                            transoral; with biopsy, single or multiple Diagnosis Code(s):        --- Professional ---                           K22.2, Esophageal obstruction                           K29.70,  Gastritis, unspecified, without bleeding                           R13.10, Dysphagia, unspecified CPT copyright 2019 American Medical Association. All rights reserved. The codes documented in this report are preliminary and upon coder review may  be revised to meet current compliance requirements. Elon Alas. Abbey Chatters, DO Sophia Abbey Chatters, DO 02/16/2021 11:44:25 AM This report has been signed electronically. Number of Addenda: 0

## 2021-02-16 NOTE — Anesthesia Preprocedure Evaluation (Signed)
Anesthesia Evaluation  Patient identified by MRN, date of birth, ID band Patient awake    Reviewed: Allergy & Precautions, NPO status , Patient's Chart, lab work & pertinent test results  History of Anesthesia Complications (+) PONV and history of anesthetic complications  Airway Mallampati: II  TM Distance: >3 FB Neck ROM: Full    Dental  (+) Upper Dentures, Dental Advisory Given   Pulmonary sleep apnea , former smoker,    Pulmonary exam normal breath sounds clear to auscultation       Cardiovascular hypertension, Pt. on medications Normal cardiovascular exam Rhythm:Regular Rate:Normal     Neuro/Psych PSYCHIATRIC DISORDERS Anxiety Depression  Neuromuscular disease    GI/Hepatic Neg liver ROS, GERD  Medicated,  Endo/Other  diabetes, Well Controlled, Type 2, Oral Hypoglycemic Agents  Renal/GU negative Renal ROS  negative genitourinary   Musculoskeletal  (+) Arthritis ,   Abdominal   Peds negative pediatric ROS (+)  Hematology negative hematology ROS (+)   Anesthesia Other Findings   Reproductive/Obstetrics negative OB ROS                             Anesthesia Physical Anesthesia Plan  ASA: II  Anesthesia Plan: General   Post-op Pain Management:    Induction: Intravenous  PONV Risk Score and Plan:   Airway Management Planned: Nasal Cannula and Natural Airway  Additional Equipment:   Intra-op Plan:   Post-operative Plan:   Informed Consent: I have reviewed the patients History and Physical, chart, labs and discussed the procedure including the risks, benefits and alternatives for the proposed anesthesia with the patient or authorized representative who has indicated his/her understanding and acceptance.     Dental advisory given  Plan Discussed with: Surgeon  Anesthesia Plan Comments:         Anesthesia Quick Evaluation

## 2021-02-19 LAB — SURGICAL PATHOLOGY

## 2021-02-19 NOTE — Addendum Note (Signed)
Addendum  created 02/19/21 0848 by Karna Dupes, CRNA   Charge Capture section accepted

## 2021-02-23 ENCOUNTER — Encounter (HOSPITAL_COMMUNITY): Payer: Self-pay | Admitting: Internal Medicine

## 2021-03-01 DIAGNOSIS — I10 Essential (primary) hypertension: Secondary | ICD-10-CM | POA: Diagnosis not present

## 2021-03-01 DIAGNOSIS — E785 Hyperlipidemia, unspecified: Secondary | ICD-10-CM | POA: Diagnosis not present

## 2021-03-02 LAB — LIPID PANEL
Chol/HDL Ratio: 3.3 ratio (ref 0.0–4.4)
Cholesterol, Total: 207 mg/dL — ABNORMAL HIGH (ref 100–199)
HDL: 62 mg/dL (ref >39)
LDL Chol Calc (NIH): 118 mg/dL — ABNORMAL HIGH (ref 0–99)
Triglycerides: 152 mg/dL — ABNORMAL HIGH (ref 0–149)
VLDL Cholesterol Cal: 27 mg/dL (ref 5–40)

## 2021-03-02 LAB — CMP14+EGFR
ALT: 20 IU/L (ref 0–32)
AST: 17 IU/L (ref 0–40)
Albumin/Globulin Ratio: 1.7 (ref 1.2–2.2)
Albumin: 4.8 g/dL (ref 3.8–4.8)
Alkaline Phosphatase: 81 IU/L (ref 44–121)
BUN/Creatinine Ratio: 23 (ref 12–28)
BUN: 23 mg/dL (ref 8–27)
Bilirubin Total: 0.2 mg/dL (ref 0.0–1.2)
CO2: 25 mmol/L (ref 20–29)
Calcium: 9.8 mg/dL (ref 8.7–10.3)
Chloride: 100 mmol/L (ref 96–106)
Creatinine, Ser: 1.01 mg/dL — ABNORMAL HIGH (ref 0.57–1.00)
Globulin, Total: 2.8 g/dL (ref 1.5–4.5)
Glucose: 99 mg/dL (ref 65–99)
Potassium: 4.1 mmol/L (ref 3.5–5.2)
Sodium: 141 mmol/L (ref 134–144)
Total Protein: 7.6 g/dL (ref 6.0–8.5)
eGFR: 60 mL/min/{1.73_m2} (ref >59)

## 2021-03-13 ENCOUNTER — Other Ambulatory Visit: Payer: Self-pay

## 2021-03-13 ENCOUNTER — Telehealth: Payer: Self-pay

## 2021-03-13 MED ORDER — BUPROPION HCL ER (XL) 150 MG PO TB24: 150.0000 mg | ORAL_TABLET | Freq: Every day | ORAL | 3 refills | Status: DC

## 2021-03-14 ENCOUNTER — Other Ambulatory Visit: Payer: Self-pay

## 2021-03-14 ENCOUNTER — Ambulatory Visit: Payer: Medicare PPO | Admitting: Family Medicine

## 2021-03-14 ENCOUNTER — Encounter: Payer: Self-pay | Admitting: Family Medicine

## 2021-03-14 ENCOUNTER — Other Ambulatory Visit: Payer: Self-pay | Admitting: Family Medicine

## 2021-03-14 VITALS — BP 128/69 | HR 73 | Temp 97.5°F | Ht 69.0 in | Wt 196.0 lb

## 2021-03-14 DIAGNOSIS — E785 Hyperlipidemia, unspecified: Secondary | ICD-10-CM | POA: Diagnosis not present

## 2021-03-14 DIAGNOSIS — E663 Overweight: Secondary | ICD-10-CM

## 2021-03-14 DIAGNOSIS — Z9189 Other specified personal risk factors, not elsewhere classified: Secondary | ICD-10-CM

## 2021-03-14 DIAGNOSIS — F17210 Nicotine dependence, cigarettes, uncomplicated: Secondary | ICD-10-CM

## 2021-03-14 DIAGNOSIS — R7303 Prediabetes: Secondary | ICD-10-CM

## 2021-03-14 DIAGNOSIS — F418 Other specified anxiety disorders: Secondary | ICD-10-CM

## 2021-03-14 DIAGNOSIS — E559 Vitamin D deficiency, unspecified: Secondary | ICD-10-CM | POA: Diagnosis not present

## 2021-03-14 DIAGNOSIS — I10 Essential (primary) hypertension: Secondary | ICD-10-CM | POA: Diagnosis not present

## 2021-03-14 DIAGNOSIS — F324 Major depressive disorder, single episode, in partial remission: Secondary | ICD-10-CM | POA: Diagnosis not present

## 2021-03-14 DIAGNOSIS — Z1231 Encounter for screening mammogram for malignant neoplasm of breast: Secondary | ICD-10-CM

## 2021-03-14 DIAGNOSIS — Z720 Tobacco use: Secondary | ICD-10-CM

## 2021-03-14 DIAGNOSIS — F410 Panic disorder [episodic paroxysmal anxiety] without agoraphobia: Secondary | ICD-10-CM

## 2021-03-14 DIAGNOSIS — T733XXA Exhaustion due to excessive exertion, initial encounter: Secondary | ICD-10-CM

## 2021-03-14 MED ORDER — PRAVASTATIN SODIUM 80 MG PO TABS: 80.0000 mg | ORAL_TABLET | Freq: Every day | ORAL | 3 refills | Status: DC

## 2021-03-14 MED ORDER — BUPROPION HCL ER (XL) 150 MG PO TB24: 150.0000 mg | ORAL_TABLET | Freq: Every day | ORAL | 3 refills | Status: DC

## 2021-03-14 MED ORDER — CHLORTHALIDONE 25 MG PO TABS: 25.0000 mg | ORAL_TABLET | Freq: Every day | ORAL | 3 refills | Status: DC

## 2021-03-14 NOTE — Patient Instructions (Addendum)
F/u end August, call if you need me sooner  Please schedule mammogram at checkout  2nd covid booster due in My, please get this  You will be referred to Cardiology to evaluate new fatigue with activity   Increase pravastatin dose to 80 mg once daily, may take TWO 40 mg tablets daily together til you get the new higher dose  ( nurse please review with pt again, she has the bottle)   Fasting lipid, cmp and eGFr and hBA1C and vit D 5 days before next visit  Continue to work on stopping snuff!  Happy Birthday when it comes!  Thanks for choosing [facility], we consider it a privelige to serve you.

## 2021-03-16 ENCOUNTER — Encounter: Payer: Self-pay | Admitting: Family Medicine

## 2021-03-16 DIAGNOSIS — E663 Overweight: Secondary | ICD-10-CM | POA: Insufficient documentation

## 2021-03-16 DIAGNOSIS — R5383 Other fatigue: Secondary | ICD-10-CM | POA: Insufficient documentation

## 2021-03-16 DIAGNOSIS — Z9189 Other specified personal risk factors, not elsewhere classified: Secondary | ICD-10-CM | POA: Insufficient documentation

## 2021-03-16 NOTE — Assessment & Plan Note (Signed)
Hyperlipidemia:Low fat diet discussed and encouraged.   Lipid Panel  Lab Results  Component Value Date   CHOL 207 (H) 03/01/2021   HDL 62 03/01/2021   LDLCALC 118 (H) 03/01/2021   TRIG 152 (H) 03/01/2021   CHOLHDL 3.3 03/01/2021   Inc med dose and reduce fat intake

## 2021-03-16 NOTE — Assessment & Plan Note (Signed)
Well controlled on medication.

## 2021-03-16 NOTE — Assessment & Plan Note (Signed)
  Patient re-educated about  the importance of commitment to a  minimum of 150 minutes of exercise per week as able.  The importance of healthy food choices with portion control discussed, as well as eating regularly and within a 12 hour window most days. The need to choose "clean , green" food 50 to 75% of the time is discussed, as well as to make water the primary drink and set a goal of 64 ounces water daily.    Weight /BMI 03/14/2021 02/16/2021 01/31/2021  WEIGHT 196 lb 189 lb 193 lb 12.8 oz  HEIGHT 5' 9" 5' 9" 5' 9"  BMI 28.94 kg/m2 27.91 kg/m2 28.62 kg/m2     

## 2021-03-16 NOTE — Assessment & Plan Note (Signed)
New onset exertional fatigue noted in past 4 to 8 weeks, cardiology to eval at increased risk for coronary event

## 2021-03-16 NOTE — Assessment & Plan Note (Signed)
  Patient re-educated about  the importance of commitment to a  minimum of 150 minutes of exercise per week as able.  The importance of healthy food choices with portion control discussed, as well as eating regularly and within a 12 hour window most days. The need to choose "clean , green" food 50 to 75% of the time is discussed, as well as to make water the primary drink and set a goal of 64 ounces water daily.    Weight /BMI 03/14/2021 02/16/2021 01/31/2021  WEIGHT 196 lb 189 lb 193 lb 12.8 oz  HEIGHT 5\' 9"  5\' 9"  5\' 9"   BMI 28.94 kg/m2 27.91 kg/m2 28.62 kg/m2

## 2021-03-16 NOTE — Assessment & Plan Note (Signed)
Patient educated about the importance of limiting  Carbohydrate intake , the need to commit to daily physical activity for a minimum of 30 minutes , and to commit weight loss. The fact that changes in all these areas will reduce or eliminate all together the development of diabetes is stressed.   Diabetic Labs Latest Ref Rng & Units 03/01/2021 02/14/2021 12/24/2020 11/30/2020 11/24/2020  HbA1c 4.8 - 5.6 % - - - 5.8(H) -  Microalbumin Not Estab. ug/mL - - - - -  Micro/Creat Ratio 0 - 29 mg/g creat - - - - -  Chol 100 - 199 mg/dL 207(H) - - - -  HDL >39 mg/dL 62 - - - -  Calc LDL 0 - 99 mg/dL 118(H) - - - -  Triglycerides 0 - 149 mg/dL 152(H) - - - -  Creatinine 0.57 - 1.00 mg/dL 1.01(H) 0.92 0.83 0.86 0.76   BP/Weight 03/14/2021 02/16/2021 01/31/2021 01/16/2021 12/26/2020 12/24/2020 02/24/8118  Systolic BP 147 829 562 130 865 784 696  Diastolic BP 69 62 77 75 69 76 73  Wt. (Lbs) 196 189 193.8 196 187 180 187  BMI 28.94 27.91 28.62 28.94 27.62 26.58 27.62   Foot/eye exam completion dates Latest Ref Rng & Units 09/12/2020 01/24/2020  Eye Exam No Retinopathy - No Retinopathy  Foot Form Completion - Done -

## 2021-03-16 NOTE — Assessment & Plan Note (Addendum)
Nicotine, pre diabetes, h HTN and hyperlipidemia place pt at invc risk and reporting relative inc in exertional dyspnea, refer to cardioloogy

## 2021-03-16 NOTE — Assessment & Plan Note (Signed)
Controlled, no change in medication  

## 2021-03-16 NOTE — Assessment & Plan Note (Signed)
Improved and controlled on current medications

## 2021-03-16 NOTE — Progress Notes (Signed)
Sophia [name]     MRN: [id number]      DOB: 06-12-1951   HPI Sophia [name] is here for follow up and re-evaluation of chronic medical conditions, medication management and review of any available recent lab and radiology data.  Preventive health is updated, specifically  Cancer screening and Immunization.   Questions or concerns regarding consultations or procedures which the PT has had in the interim are  addressed. The PT denies any adverse reactions to current medications since the last visit.  C/o increased exertional fatigue with activity, noted in the past 2 months, denies chest pain, at increased risk for CAD   ROS Denies recent fever or chills. Denies sinus pressure, nasal congestion, ear pain or sore throat. Denies chest congestion, productive cough or wheezing. Denies chest pains, palpitations and leg swelling Denies abdominal pain, nausea, vomiting,diarrhea or constipation.   Denies dysuria, frequency, hesitancy or incontinence. Denies uncontrolled  joint pain, swelling and limitation in mobility. Denies headaches, seizures, numbness, or tingling. Denies depression, anxiety or insomnia. Denies skin break down or rash.   PE  BP 128/69 (BP Location: Left Arm, Patient Position: Sitting, Cuff Size: Normal)   Pulse 73   Temp (!) 97.5 F (36.4 C) (Temporal)   Ht 5\' 9"  (1.753 m)   Wt 196 lb (88.9 kg)   SpO2 95%   BMI 28.94 kg/m   Patient alert and oriented and in no cardiopulmonary distress.  HEENT: No facial asymmetry, EOMI,     Neck decreased ROM.  Chest: Clear to auscultation bilaterally.  CVS: S1, S2 no murmurs, no S3.Regular rate.  ABD: Soft non tender.   Ext: No edema  MS: Adequate though reduced  ROM spine, shoulders, hips and knees.  Skin: Intact, no ulcerations or rash noted.  Psych: Good eye contact, normal affect. Memory intact not anxious or depressed appearing.  CNS: CN 2-12 intact, power,  normal throughout.no focal deficits noted.   Assessment  & Plan  Fatigue New onset exertional fatigue noted in past 4 to 8 weeks, cardiology to eval at increased risk for coronary event  Current nicotine use Asked:confirms currently dips snuff Assess: Unwilling to set a quit date, but is cutting back Advise: needs to QUIT to reduce risk of cancer, cardio and cerebrovascular disease Assist: counseled for 5 minutes and literature provided Arrange: follow up in 2 to 4 months   At increased risk for cardiovascular disease Nicotine, pre diabetes, h HTN and hyperlipidemia place pt at invc risk and reporting relative inc in exertional dyspnea, refer to cardioloogy  Depression with anxiety Controlled, no change in medication   Depression, major, single episode, in partial remission ([place]) Well controlled on medication  Obesity (BMI 30-39.9)  Patient re-educated about  the importance of commitment to a  minimum of 150 minutes of exercise per week as able.  The importance of healthy food choices with portion control discussed, as well as eating regularly and within a 12 hour window most days. The need to choose "clean , green" food 50 to 75% of the time is discussed, as well as to make water the primary drink and set a goal of 64 ounces water daily.    Weight /BMI 03/14/2021 02/16/2021 01/31/2021  WEIGHT 196 lb 189 lb 193 lb 12.8 oz  HEIGHT 5\' 9"  5\' 9"  5\' 9"   BMI 28.94 kg/m2 27.91 kg/m2 28.62 kg/m2      Panic anxiety syndrome Improved and controlled on current medications  Hyperlipidemia with target LDL less than 100  Hyperlipidemia:Low fat diet discussed and encouraged.   Lipid Panel  Lab Results  Component Value Date   CHOL 207 (H) 03/01/2021   HDL 62 03/01/2021   LDLCALC 118 (H) 03/01/2021   TRIG 152 (H) 03/01/2021   CHOLHDL 3.3 03/01/2021   Inc med dose and reduce fat intake    Overweight (BMI 25.0-29.9)  Patient re-educated about  the importance of commitment to a  minimum of 150 minutes of exercise per week as  able.  The importance of healthy food choices with portion control discussed, as well as eating regularly and within a 12 hour window most days. The need to choose "clean , green" food 50 to 75% of the time is discussed, as well as to make water the primary drink and set a goal of 64 ounces water daily.    Weight /BMI 03/14/2021 02/16/2021 01/31/2021  WEIGHT 196 lb 189 lb 193 lb 12.8 oz  HEIGHT 5\' 9"  5\' 9"  5\' 9"   BMI 28.94 kg/m2 27.91 kg/m2 28.62 kg/m2      Prediabetes Patient educated about the importance of limiting  Carbohydrate intake , the need to commit to daily physical activity for a minimum of 30 minutes , and to commit weight loss. The fact that changes in all these areas will reduce or eliminate all together the development of diabetes is stressed.   Diabetic Labs Latest Ref Rng & Units 03/01/2021 02/14/2021 12/24/2020 11/30/2020 11/24/2020  HbA1c 4.8 - 5.6 % - - - 5.8(H) -  Microalbumin Not Estab. ug/mL - - - - -  Micro/Creat Ratio 0 - 29 mg/g creat - - - - -  Chol 100 - 199 mg/dL 207(H) - - - -  HDL >39 mg/dL 62 - - - -  Calc LDL 0 - 99 mg/dL 118(H) - - - -  Triglycerides 0 - 149 mg/dL 152(H) - - - -  Creatinine 0.57 - 1.00 mg/dL 1.01(H) 0.92 0.83 0.86 0.76   BP/Weight 03/14/2021 02/16/2021 01/31/2021 01/16/2021 12/26/2020 12/24/2020 3/53/2992  Systolic BP 426 834 196 222 979 892 119  Diastolic BP 69 62 77 75 69 76 73  Wt. (Lbs) 196 189 193.8 196 187 180 187  BMI 28.94 27.91 28.62 28.94 27.62 26.58 27.62   Foot/eye exam completion dates Latest Ref Rng & Units 09/12/2020 01/24/2020  Eye Exam No Retinopathy - No Retinopathy  Foot Form Completion - Done -

## 2021-03-16 NOTE — Assessment & Plan Note (Signed)
Asked:confirms currently dips snuff °Assess: Unwilling to set a quit date, but is cutting back °Advise: needs to QUIT to reduce risk of cancer, cardio and cerebrovascular disease °Assist: counseled for 5 minutes and literature provided °Arrange: follow up in 2 to 4 months ° °

## 2021-03-20 ENCOUNTER — Encounter: Payer: Self-pay | Admitting: Internal Medicine

## 2021-05-07 ENCOUNTER — Other Ambulatory Visit: Payer: Self-pay | Admitting: Family Medicine

## 2021-05-10 NOTE — Progress Notes (Signed)
CARDIOLOGY CONSULT NOTE       Patient ID: Sophia [name] MRN: [id number] DOB/AGE: Oct 24, 1951 70 y.o.  Admit date: (Not on file) Referring Physician: Moshe Cipro Primary Physician: Fayrene Helper, MD Primary Cardiologist: New Reason for Consultation: Fatigue CAD Risk  Active Problems:   * No active hospital problems. *   HPI:  70 y.o. referred by Dr Moshe Cipro for progressive fatigue last 8 weeks and risk of CAD. May be associated with more dyspnea. CRF;s include dipping snuff, HTN, HLD and pre DM.  Office visit 03/14/21 pulse in 70's BP fine   Lab review shows LDL 118 normal renal function Hct 39.3 TSH normal most recently 09/01/20 A1c generally in low 6 range   Has been seen by Dr Acie Fredrickson in 2019 Cath at that time showed no CAD TTE with normal EF. She had fatigue and dyspnea at that time as well Advised to lose weight   She really only has mild chronic dyspnea that is functional Mild fatigue use to work at 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 Married. Two older children One just moved back in with Her for a bit which is stressful  No chest pain palpitations syncope. Compliant with meds   ROS All other systems reviewed and negative except as noted above  Past Medical History:  Diagnosis Date   Acute respiratory disease due to COVID-19 virus 08/28/2019   Anxiety    Arthritis    Depression    Diabetes mellitus type 2 in obese ([place]) 11/28/2010   Qualifier: Diagnosis of  By: Moshe Cipro MD, Margaret  Diet controlled in 12/2016    Diabetes mellitus without complication (HCC)    GERD (gastroesophageal reflux disease)    Hypercholesteremia    Hypertension    PONV (postoperative nausea and vomiting)    Sleep apnea     Family History  Problem Relation Age of Onset   Colon polyps Sister 37   Diabetes Sister    Hypertension Sister    Kidney disease Sister    Arthritis Father    Cancer Father        prostate    Hypertension Father    Diabetes Sister    Hypertension  Sister    CAD Paternal Grandmother    Colon cancer Neg Hx     Social History   Socioeconomic History   Marital status: Married    Spouse name: Not on file   Number of children: Not on file   Years of education: Not on file   Highest education level: Not on file  Occupational History   Not on file  Tobacco Use   Smoking status: Some Days    Packs/day: 0.25    Years: 20.00    Pack years: 5.00    Types: Cigarettes    Last attempt to quit: 01/05/1990    Years since quitting: 31.3   Smokeless tobacco: Current    Types: Snuff  Vaping Use   Vaping Use: Never used  Substance and Sexual Activity   Alcohol use: No   Drug use: Yes    Types: Marijuana   Sexual activity: Not Currently    Birth control/protection: Surgical  Other Topics Concern   Not on file  Social History Narrative   Not on file   Social Determinants of Health   Financial Resource Strain: Low Risk    Difficulty of Paying Living Expenses: Not hard at all  Food Insecurity: No Food Insecurity   Worried About Running Out of  Food in the Last Year: Never true   [place] in the Last Year: Never true  Transportation Needs: No Transportation Needs   Lack of Transportation (Medical): No   Lack of Transportation (Non-Medical): No  Physical Activity: Insufficiently Active   Days of Exercise per Week: 3 days   Minutes of Exercise per Session: 30 min  Stress: No Stress Concern Present   Feeling of Stress : Only a little  Social Connections: Moderately Integrated   Frequency of Communication with Friends and Family: More than three times a week   Frequency of Social Gatherings with Friends and Family: Twice a week   Attends Religious Services: 1 to 4 times per year   Active Member of [employer] or Organizations: No   Attends Archivist Meetings: Never   Marital Status: Married  Human resources officer Violence: Not At Risk   Fear of Current or Ex-Partner: No   Emotionally Abused: No   Physically Abused: No    Sexually Abused: No    Past Surgical History:  Procedure Laterality Date   ABDOMINAL HYSTERECTOMY  1982   tubes and womb, bleeding and ectopic   ABDOMINAL SURGERY     removal of tumors   BALLOON DILATION N/A 02/16/2021   Procedure: BALLOON DILATION;  Surgeon: [name], DO;  [facility];  Service: Endoscopy;  Laterality: N/A;   BIOPSY  02/16/2021   Procedure: BIOPSY;  Surgeon: [name], DO;  [facility];  Service: Endoscopy;;   BREAST SURGERY Left 1983   benign tumor   CATARACT EXTRACTION W/PHACO Right 01/11/2014   Procedure: CATARACT EXTRACTION PHACO AND INTRAOCULAR LENS PLACEMENT (IOC);  Surgeon: Elta Guadeloupe T. Gershon Crane, MD;  Location: [facility];  Service: Ophthalmology;  Laterality: Right;  CDE 5.57   CATARACT EXTRACTION W/PHACO Left 01/25/2014   Procedure: CATARACT EXTRACTION PHACO AND INTRAOCULAR LENS PLACEMENT (IOC);  Surgeon: Elta Guadeloupe T. Gershon Crane, MD;  Location: [facility];  Service: Ophthalmology;  Laterality: Left;  CDE:5.19   COLONOSCOPY  2007 [facility]   NL EXAM   COLONOSCOPY N/A 04/22/2014   Dr. Fields:Normal mucosa in the terminal ileum/Two COLON polyps REMOVED/ Mild diverticulosis in the ascending colon and transverse colon/The LEFT colon IS redundant/Small internal hemorrhoids. Path: tubular adenoma. Next colonoscopy in 5-10 years   COLONOSCOPY WITH PROPOFOL N/A 11/27/2020   Procedure: COLONOSCOPY WITH PROPOFOL;  Surgeon: [name], DO;  [facility];  Service: Endoscopy;  Laterality: N/A;  10:00   ESOPHAGOGASTRODUODENOSCOPY N/A 04/22/2014   Dr. Fields:MICROCYTIC ANEMIA MOST LIKELY DUE TO ASA/VOLTAREN/Small hiatal hernia/MODERATE Non-erosive gastritis. 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 Gershon Crane, MD;  Location: [facility];  Service: Ophthalmology;  Laterality: Right;   YAG LASER APPLICATION Left 04/05/8415   Procedure: YAG LASER APPLICATION;  Surgeon: Rutherford Guys, MD;  Location: [facility];  Service: Ophthalmology;  Laterality: Left;      Current Outpatient Medications:    acetaminophen (TYLENOL) 650 MG CR tablet, Take 650 mg by mouth 2 (two) times daily., Disp: , Rfl:    amLODipine (NORVASC) 10 MG tablet, TAKE 1 TABLET EVERY DAY, Disp: 90 tablet, Rfl: 0   aspirin EC 81 MG tablet, Take 81 mg by mouth daily. Swallow whole., Disp: , Rfl:    benazepril-hydrochlorthiazide (LOTENSIN HCT) 20-12.5 MG tablet, TAKE 2 TABLETS EVERY DAY, Disp: 180 tablet, Rfl: 0   BLACK CURRANT SEED OIL PO, Take 5 mLs by mouth daily as needed (energy). (Patient not taking: Reported on 03/14/2021), Disp: , Rfl:    buPROPion (WELLBUTRIN XL) 150 MG 24 hr tablet, Take 1 tablet (150 mg total) by mouth daily., Disp: 90 tablet, Rfl: 3   busPIRone (BUSPAR) 7.5 MG tablet, TAKE 1 TABLET THREE TIMES DAILY (DOSE INCREASE) (Patient taking differently: Take 7.5 mg by mouth 3 (three) times daily.), Disp: 270 tablet, Rfl: 3   chlorthalidone (HYGROTON) 25 MG tablet, Take 1 tablet (25 mg total) by mouth daily., Disp: 90 tablet, Rfl: 3    chlorthalidone (HYGROTON) 25 MG tablet, TAKE ONE TABLET BY MOUTH ONCE DAILY., Disp: 30 tablet, Rfl: 0   Cyanocobalamin (VITAMIN B-12 PO), Take 2 gummies daily., Disp: , Rfl:    FLUoxetine (PROZAC) 40 MG capsule, TAKE 1 CAPSULE EVERY DAY, Disp: 90 capsule, Rfl: 0   gabapentin (NEURONTIN) 100 MG capsule, TAKE 1 CAPSULE THREE TIMES DAILY, Disp: 270 capsule, Rfl: 0   Multiple Vitamin (MULTIVITAMIN) tablet, Take 1 tablet by mouth daily., Disp: , Rfl:    omeprazole (PRILOSEC) 40 MG capsule, TAKE 1 CAPSULE EVERY DAY, Disp: 90 capsule, Rfl: 0   pravastatin (PRAVACHOL) 80 MG tablet, Take 1 tablet (80 mg total) by mouth daily., Disp: 90 tablet, Rfl: 3   psyllium (REGULOID) 0.52 g capsule, Take 1 capsule by mouth daily., Disp: , Rfl:    zinc gluconate 50 MG tablet, Take 50 mg by mouth daily., Disp: , Rfl:     Physical Exam: There were no vitals taken for this visit.    Affect appropriate Obese black female  HEENT: normal Neck supple with no adenopathy JVP normal no bruits no thyromegaly Lungs clear with no wheezing and good diaphragmatic motion Heart:  S1/S2 no murmur, no rub, gallop or click PMI normal Abdomen: benighn, BS positve, no tenderness, no AAA no bruit.  No HSM or HJR Distal pulses intact with no bruits No edema Neuro non-focal Skin warm and dry No muscular weakness   Labs:   Lab Results  Component Value Date   WBC 5.3 12/24/2020   HGB 12.6 12/24/2020   HCT 39.3 12/24/2020   MCV 83.6 12/24/2020   PLT 212 12/24/2020   No results for input(s): NA, K, CL, CO2, BUN, CREATININE, CALCIUM, PROT, BILITOT, ALKPHOS, ALT, AST, GLUCOSE in the last 168 hours.  Invalid input(s): LABALBU Lab Results  Component Value Date   TROPONINI <0.03 06/23/2018    Lab Results  Component Value Date   CHOL 207 (H) 03/01/2021   CHOL 185 09/01/2020   CHOL 190 01/28/2020   Lab Results  Component Value Date   HDL 62 03/01/2021  HDL 66 09/01/2020   HDL 61 01/28/2020   Lab Results   Component Value Date   LDLCALC 118 (H) 03/01/2021   LDLCALC 97 09/01/2020   LDLCALC 107 (H) 01/28/2020   Lab Results  Component Value Date   TRIG 152 (H) 03/01/2021   TRIG 122 09/01/2020   TRIG 119 01/28/2020   Lab Results  Component Value Date   CHOLHDL 3.3 03/01/2021   CHOLHDL 2.8 09/01/2020   CHOLHDL 3.1 01/28/2020   No results found for: LDLDIRECT    Radiology: No results found.  EKG: SR rate 72 normal 12/25/20    ASSESSMENT AND PLAN:   Dyspnea/Fatigue:  Does not appear to be cardiac etiology Sedentary and obese. History of normal EF and no CAD by cath/TTE 2019. ECG is normal will update echo for EF. Labs including Hct/Renal function and thyroid appear normal.  HtN:  Well controlled.  Continue current medications and low sodium Dash type diet.   HLD:  on statin labs with primary  Anxiety/Depression: continue Buspar mood seems appropriate CAD:  risk no obstructive disease by cath 2019 Recommended coronary calcium score for risk stratification   Calcium score  Echo  F/U cardiology in a year   Signed: Jenkins Rouge 05/10/2021, 3:21 PM

## 2021-05-18 ENCOUNTER — Ambulatory Visit: Payer: Medicare PPO | Admitting: Gastroenterology

## 2021-05-23 ENCOUNTER — Other Ambulatory Visit: Payer: Self-pay

## 2021-05-23 ENCOUNTER — Encounter: Payer: Self-pay | Admitting: Cardiovascular Disease

## 2021-05-23 ENCOUNTER — Ambulatory Visit: Payer: Medicare PPO | Admitting: Cardiovascular Disease

## 2021-05-23 VITALS — BP 114/58 | HR 80 | Resp 95 | Ht 69.0 in | Wt 188.0 lb

## 2021-05-23 DIAGNOSIS — I10 Essential (primary) hypertension: Secondary | ICD-10-CM | POA: Diagnosis not present

## 2021-05-23 DIAGNOSIS — E782 Mixed hyperlipidemia: Secondary | ICD-10-CM

## 2021-05-23 DIAGNOSIS — R0600 Dyspnea, unspecified: Secondary | ICD-10-CM | POA: Diagnosis not present

## 2021-05-23 DIAGNOSIS — R5383 Other fatigue: Secondary | ICD-10-CM

## 2021-05-23 NOTE — Patient Instructions (Addendum)
Medication Instructions:  Your physician recommends that you continue on your current medications as directed. Please refer to the Current Medication list given to you today.  *If you need a refill on your cardiac medications before your next appointment, please call your pharmacy*   Lab Work: None today  If you have labs (blood work) drawn today and your tests are completely normal, you will receive your results only by: [place] (if you have MyChart) OR A paper copy in the mail If you have any lab test that is abnormal or we need to change your treatment, we will call you to review the results.   Testing/Procedures: Your physician has requested that you have an echocardiogram. Echocardiography is a painless test that uses sound waves to create images of your heart. It provides your doctor with information about the size and shape of your heart and how well your heart's chambers and valves are working. This procedure takes approximately one hour. There are no restrictions for this procedure.    Coronary calcium Score   Follow-Up: At [facility], you and your health needs are our priority.  As part of our continuing mission to provide you with exceptional heart care, we have created designated Provider Care Teams.  These Care Teams include your primary Cardiologist (physician) and Advanced Practice Providers (APPs -  Physician Assistants and Nurse Practitioners) who all work together to provide you with the care you need, when you need it.  We recommend signing up for the patient portal called "MyChart".  Sign up information is provided on this After Visit Summary.  MyChart is used to connect with patients for Virtual Visits (Telemedicine).  Patients are able to view lab/test results, encounter notes, upcoming appointments, etc.  Non-urgent messages can be sent to your provider as well.   To learn more about what you can do with MyChart, go to [email or web address].    Your  next appointment:  Follow up to be determined after echocardiogram. We will call you with results.      Thank you for choosing [facility] !

## 2021-06-04 ENCOUNTER — Ambulatory Visit (HOSPITAL_COMMUNITY)
Admission: RE | Admit: 2021-06-04 | Discharge: 2021-06-04 | Disposition: A | Discharge status: Home / Self Care | Payer: Medicare PPO | Source: Ambulatory Visit | Attending: Cardiovascular Disease | Admitting: Cardiovascular Disease

## 2021-06-04 ENCOUNTER — Other Ambulatory Visit: Payer: Self-pay

## 2021-06-04 DIAGNOSIS — R5383 Other fatigue: Secondary | ICD-10-CM | POA: Diagnosis not present

## 2021-06-04 DIAGNOSIS — R0609 Other forms of dyspnea: Secondary | ICD-10-CM | POA: Diagnosis not present

## 2021-06-04 DIAGNOSIS — R0600 Dyspnea, unspecified: Secondary | ICD-10-CM

## 2021-06-04 LAB — ECHOCARDIOGRAM COMPLETE
Area-P 1/2: 2.8 cm2
S' Lateral: 2.2 cm

## 2021-06-04 NOTE — Progress Notes (Signed)
*  PRELIMINARY RESULTS* Echocardiogram 2D Echocardiogram has been performed.  Samuel Germany 06/04/2021, 10:16 AM

## 2021-06-14 ENCOUNTER — Ambulatory Visit (HOSPITAL_COMMUNITY)
Admission: RE | Admit: 2021-06-14 | Discharge: 2021-06-14 | Disposition: A | Discharge status: Home / Self Care | Payer: Self-pay | Source: Ambulatory Visit | Attending: Cardiovascular Disease | Admitting: Cardiovascular Disease

## 2021-06-14 ENCOUNTER — Telehealth: Payer: Self-pay

## 2021-06-14 ENCOUNTER — Other Ambulatory Visit: Payer: Self-pay

## 2021-06-14 DIAGNOSIS — Z136 Encounter for screening for cardiovascular disorders: Secondary | ICD-10-CM | POA: Insufficient documentation

## 2021-06-14 DIAGNOSIS — I700 Atherosclerosis of aorta: Secondary | ICD-10-CM | POA: Insufficient documentation

## 2021-06-14 MED ORDER — ROSUVASTATIN CALCIUM 20 MG PO TABS: 20.0000 mg | ORAL_TABLET | Freq: Every day | ORAL | 3 refills | Status: DC

## 2021-06-14 NOTE — Telephone Encounter (Signed)
I spoke with patient and discussed cardiac CT results. Patient agrees to change to crestor 20 mg qd.She requested [employer]

## 2021-06-14 NOTE — Telephone Encounter (Signed)
-----   Message from Josue Hector, MD sent at 06/14/2021  2:11 PM EDT ----- Calcium score high for age She is already on high dose pravastatin d/c and change to crestor 20 mg daily and repeat labs in 3 months

## 2021-06-24 NOTE — Progress Notes (Signed)
Referring Provider: Fayrene Helper, MD Primary Care Physician:  Fayrene Helper, MD Primary GI Physician: Dr. Abbey Chatters  Chief Complaint  Patient presents with   pp f/u    Swallowing ok, GERD ok    HPI:   Sophia [name] is a 70 y.o. female presenting today for follow-up of GERD and dysphagia s/p EGD 02/16/2021 revealing benign-appearing esophageal stenosis s/p dilation, gastritis biopsied.  Biopsies were benign.   Colonoscopy also up-to-date 11/27/2020: Nonbleeding internal hemorrhoids, one 6 mm transverse colon polyp resected, diverticulosis in the sigmoid colon.  Pathology with tubular adenoma.  Repeat in 5 years.  Today: Dysphagia resolved.   GERD well controlled on omeprazole 40 mg daily.  Denies abdominal pain, nausea, vomiting.  Bowels move well.  Occasionally, she will use MiraLAX, but nothing routine.  Denies BRBPR or melena.    Past Medical History:  Diagnosis Date   Acute respiratory disease due to COVID-19 virus 08/28/2019   Anxiety    Arthritis    Depression    Diabetes mellitus type 2 in obese ([place]) 11/28/2010   Qualifier: Diagnosis of  By: Moshe Cipro MD, Margaret  Diet controlled in 12/2016    Diabetes mellitus without complication ([place])    GERD (gastroesophageal reflux disease)    Hypercholesteremia    Hypertension    PONV (postoperative nausea and vomiting)    Sleep apnea     Past Surgical History:  Procedure Laterality Date   ABDOMINAL HYSTERECTOMY  11/18/1980   tubes and womb, bleeding and ectopic   ABDOMINAL SURGERY     removal of tumors   BALLOON DILATION N/A 02/16/2021   Procedure: BALLOON DILATION;  Surgeon: [name], DO;  [facility];  Service: Endoscopy;  Laterality: N/A;   BIOPSY  02/16/2021   Procedure: BIOPSY;  Surgeon: [name], DO;  [facility];  Service: Endoscopy;;   BREAST SURGERY Left 11/18/1981   benign tumor   CATARACT EXTRACTION W/PHACO Right 01/11/2014   Procedure: CATARACT  EXTRACTION PHACO AND INTRAOCULAR LENS PLACEMENT (IOC);  Surgeon: Elta Guadeloupe T. Gershon Crane, MD;  Location: [facility];  Service: Ophthalmology;  Laterality: Right;  CDE 5.57   CATARACT EXTRACTION W/PHACO Left 01/25/2014   Procedure: CATARACT EXTRACTION PHACO AND INTRAOCULAR LENS PLACEMENT (IOC);  Surgeon: Elta Guadeloupe T. Gershon Crane, MD;  Location: [facility];  Service: Ophthalmology;  Laterality: Left;  CDE:5.19   COLONOSCOPY  2007 BRBPR   NL EXAM   COLONOSCOPY N/A 04/22/2014   Dr. Fields:Normal mucosa in the terminal ileum/Two COLON polyps REMOVED/ Mild diverticulosis in the ascending colon and transverse colon/The LEFT colon IS redundant/Small internal hemorrhoids. Path: tubular adenoma. Next colonoscopy in 5-10 years   COLONOSCOPY WITH PROPOFOL N/A 11/27/2020   Surgeon: [name], DO;  Nonbleeding internal hemorrhoids, one 6 mm transverse colon polyp resected, diverticulosis in the sigmoid colon.  Pathology with tubular adenoma.  Repeat in 5 years.   ESOPHAGOGASTRODUODENOSCOPY N/A 04/22/2014   Dr. Fields:MICROCYTIC ANEMIA MOST LIKELY DUE TO ASA/VOLTAREN/Small hiatal hernia/MODERATE Non-erosive gastritis. Negative H.pylori   ESOPHAGOGASTRODUODENOSCOPY (EGD) WITH PROPOFOL N/A 02/16/2021   Surgeon: [name], DO; benign-appearing esophageal stenosis s/p dilation, gastritis biopsied.  Biopsies were benign.   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 Gershon Crane, MD;  Location: [facility];  Service: Ophthalmology;  Laterality: Right;   YAG LASER APPLICATION Left [id number]   Procedure: YAG LASER APPLICATION;  Surgeon: Rutherford Guys, MD;  Location: [facility];  Service: Ophthalmology;  Laterality: Left;    Current Outpatient Medications  Medication Sig Dispense Refill   acetaminophen (TYLENOL) 650 MG CR tablet Take 650 mg by mouth 2 (two) times daily.     amLODipine (NORVASC) 10 MG tablet TAKE 1 TABLET EVERY DAY 90 tablet 0   aspirin EC 81 MG tablet Take 81 mg by mouth daily. Swallow whole.     benazepril-hydrochlorthiazide (LOTENSIN HCT) 20-12.5 MG tablet TAKE 2 TABLETS EVERY DAY 180 tablet 0   buPROPion (WELLBUTRIN XL) 150 MG 24 hr tablet Take 1 tablet (150 mg total) by mouth daily. 90 tablet 3   busPIRone (BUSPAR) 7.5 MG tablet TAKE 1 TABLET THREE TIMES DAILY (DOSE INCREASE) (Patient taking differently: Take 7.5 mg by mouth 3 (three) times daily.) 270 tablet 3   chlorthalidone (HYGROTON) 25 MG tablet Take 1 tablet (25 mg total) by mouth daily. 90 tablet 3   chlorthalidone (HYGROTON) 25 MG tablet TAKE ONE TABLET BY MOUTH ONCE DAILY. 30 tablet 0   Cyanocobalamin (VITAMIN B-12 PO) Take 2 gummies daily.     FLUoxetine (PROZAC) 40 MG capsule TAKE 1 CAPSULE EVERY DAY 90 capsule 0   gabapentin (NEURONTIN) 100 MG capsule TAKE 1 CAPSULE THREE TIMES DAILY 270 capsule 0   Multiple Vitamin (MULTIVITAMIN) tablet Take 1 tablet by mouth daily.     omeprazole (PRILOSEC) 40 MG capsule TAKE 1 CAPSULE EVERY DAY 90 capsule 0   polyethylene glycol (MIRALAX / GLYCOLAX) 17 g packet Take 17 g by mouth daily as needed.     psyllium (REGULOID) 0.52 g capsule Take 1 capsule by mouth daily.     rosuvastatin (CRESTOR) 20 MG tablet Take 1 tablet (20 mg total) by mouth daily. 90 tablet 3   zinc gluconate 50 MG tablet Take 50  mg by mouth daily.     No current facility-administered medications for this visit.    Allergies as of 06/25/2021   (No Known Allergies)    Family History  Problem Relation Age of Onset   Colon polyps Sister 50   Diabetes Sister    Hypertension Sister    Kidney disease Sister    Arthritis Father    Cancer Father        prostate    Hypertension Father    Diabetes Sister    Hypertension Sister    CAD Paternal Grandmother    Colon cancer Neg Hx     Social History   Socioeconomic History   Marital status: Married    Spouse name: Not on file   Number of children: Not on file   Years of education: Not on file   Highest education level: Not on file  Occupational History   Not on file  Tobacco Use   Smoking status: Former    Packs/day: 0.25    Years: 20.00  Pack years: 5.00    Types: Cigarettes    Quit date: 01/05/1990    Years since quitting: 31.4   Smokeless tobacco: Current    Types: Snuff  Vaping Use   Vaping Use: Never used  Substance and Sexual Activity   Alcohol use: No   Drug use: Yes    Types: Marijuana    Comment: occ marijuana   Sexual activity: Not Currently    Birth control/protection: Surgical  Other Topics Concern   Not on file  Social History Narrative   Not on file   Social Determinants of Health   Financial Resource Strain: Low Risk    Difficulty of Paying Living Expenses: Not hard at all  Food Insecurity: No Food Insecurity   Worried About Charity fundraiser in the Last Year: Never true   Ran Out of Food in the Last Year: Never true  Transportation Needs: No Transportation Needs   Lack of Transportation (Medical): No   Lack of Transportation (Non-Medical): No  Physical Activity: Insufficiently Active   Days of Exercise per Week: 3 days   Minutes of Exercise per Session: 30 min  Stress: No Stress Concern Present   Feeling of Stress : Only a little  Social Connections: Moderately Integrated   Frequency of Communication with Friends  and Family: More than three times a week   Frequency of Social Gatherings with Friends and Family: Twice a week   Attends Religious Services: 1 to 4 times per year   Active Member of [employer] or Organizations: No   Attends Music therapist: Never   Marital Status: Married    Review of Systems: Gen: Denies fever, chills, cold or flulike symptoms, presyncope, syncope. CV: Denies chest pain, palpitations Resp: Mild shortness of breath with exertion.  No shortness of breath at rest.  No cough. GI: See HPI Heme: See HPI  Physical Exam: BP 115/73   Pulse 72   Temp (!) 97.3 F (36.3 C) (Temporal)   Ht '5\' 9"'$  (1.753 m)   Wt 183 lb 12.8 oz (83.4 kg)   BMI 27.14 kg/m  General:   Alert and oriented. No distress noted. Pleasant and cooperative.  Head:  Normocephalic and atraumatic. Eyes:  Conjuctiva clear without scleral icterus. Heart:  S1, S2 present without murmurs appreciated. Lungs:  Clear to auscultation bilaterally. No wheezes, rales, or rhonchi. No distress.  Abdomen:  +BS, soft, non-tender and non-distended. No rebound or guarding. No HSM or masses noted. Msk:  Symmetrical without gross deformities. Normal posture. Extremities:  Without edema. Neurologic:  Alert and  oriented x4 Psych:  Normal mood and affect.     Assessment: 70 year old female presenting today for follow-up of GERD and dysphagia s/p EGD completed 02/16/2021 revealing benign-appearing esophageal stenosis s/p dilation, gastritis biopsied.  Biopsies were benign.  Clinically, she is doing very well.  GERD is well controlled on omeprazole 40 mg daily.  Dysphagia has resolved.  She does have history of adenomatous colon polyps.  Colonoscopy is up-to-date in January 2022, due for repeat in 2027.   Plan: 1.  Continue omeprazole 40 mg daily 30 minutes before breakfast. 2.  Monitor for return of dysphagia symptoms and let us know if this occurs. 3.  Follow-up in 1 year or sooner if needed.    Aliene Altes, PA-C [facility] 06/25/2021

## 2021-06-25 ENCOUNTER — Ambulatory Visit: Payer: Medicare PPO | Admitting: Gastroenterology

## 2021-06-25 ENCOUNTER — Encounter: Payer: Self-pay | Admitting: Gastroenterology

## 2021-06-25 ENCOUNTER — Other Ambulatory Visit: Payer: Self-pay

## 2021-06-25 VITALS — BP 115/73 | HR 72 | Temp 97.3°F | Ht 69.0 in | Wt 183.8 lb

## 2021-06-25 DIAGNOSIS — K219 Gastro-esophageal reflux disease without esophagitis: Secondary | ICD-10-CM

## 2021-06-25 DIAGNOSIS — R1319 Other dysphagia: Secondary | ICD-10-CM

## 2021-06-25 NOTE — Patient Instructions (Signed)
Continue omeprazole 40 mg daily 30 minutes before breakfast.  Monitor for return of swallowing problems and let us know if this occurs.  We will plan to follow-up with you in 1 year.  Do not hesitate to call if you have any questions or concerns prior to your next visit.   It was great meeting you today!  I wish you the best of luck with everything moving forward!  Aliene Altes, PA-C [facility]

## 2021-06-29 DIAGNOSIS — E785 Hyperlipidemia, unspecified: Secondary | ICD-10-CM | POA: Diagnosis not present

## 2021-06-29 DIAGNOSIS — E559 Vitamin D deficiency, unspecified: Secondary | ICD-10-CM | POA: Diagnosis not present

## 2021-06-29 DIAGNOSIS — R7303 Prediabetes: Secondary | ICD-10-CM | POA: Diagnosis not present

## 2021-06-29 DIAGNOSIS — I10 Essential (primary) hypertension: Secondary | ICD-10-CM | POA: Diagnosis not present

## 2021-06-30 LAB — CMP14+EGFR
ALT: 20 IU/L (ref 0–32)
AST: 19 IU/L (ref 0–40)
Albumin/Globulin Ratio: 1.8 (ref 1.2–2.2)
Albumin: 4.8 g/dL — ABNORMAL HIGH (ref 3.7–4.7)
Alkaline Phosphatase: 77 IU/L (ref 44–121)
BUN/Creatinine Ratio: 23 (ref 12–28)
BUN: 27 mg/dL (ref 8–27)
Bilirubin Total: <0.2 mg/dL (ref 0.0–1.2)
CO2: 29 mmol/L (ref 20–29)
Calcium: 9.9 mg/dL (ref 8.7–10.3)
Chloride: 99 mmol/L (ref 96–106)
Creatinine, Ser: 1.15 mg/dL — ABNORMAL HIGH (ref 0.57–1.00)
Globulin, Total: 2.6 g/dL (ref 1.5–4.5)
Glucose: 109 mg/dL — ABNORMAL HIGH (ref 65–99)
Potassium: 4.1 mmol/L (ref 3.5–5.2)
Sodium: 142 mmol/L (ref 134–144)
Total Protein: 7.4 g/dL (ref 6.0–8.5)
eGFR: 51 mL/min/{1.73_m2} — ABNORMAL LOW (ref >59)

## 2021-06-30 LAB — HEMOGLOBIN A1C
Est. average glucose Bld gHb Est-mCnc: 128 mg/dL
Hgb A1c MFr Bld: 6.1 % — ABNORMAL HIGH (ref 4.8–5.6)

## 2021-06-30 LAB — LIPID PANEL
Chol/HDL Ratio: 2 ratio (ref 0.0–4.4)
Cholesterol, Total: 115 mg/dL (ref 100–199)
HDL: 57 mg/dL (ref >39)
LDL Chol Calc (NIH): 41 mg/dL (ref 0–99)
Triglycerides: 87 mg/dL (ref 0–149)
VLDL Cholesterol Cal: 17 mg/dL (ref 5–40)

## 2021-06-30 LAB — VITAMIN D 25 HYDROXY (VIT D DEFICIENCY, FRACTURES): Vit D, 25-Hydroxy: 50 ng/mL (ref 30.0–100.0)

## 2021-07-12 ENCOUNTER — Ambulatory Visit (HOSPITAL_COMMUNITY): Payer: Medicare PPO

## 2021-07-13 ENCOUNTER — Ambulatory Visit (HOSPITAL_COMMUNITY)
Admission: RE | Admit: 2021-07-13 | Discharge: 2021-07-13 | Disposition: A | Discharge status: Home / Self Care | Payer: Medicare PPO | Source: Ambulatory Visit | Attending: Family Medicine | Admitting: Family Medicine

## 2021-07-13 ENCOUNTER — Other Ambulatory Visit: Payer: Self-pay

## 2021-07-13 DIAGNOSIS — Z1231 Encounter for screening mammogram for malignant neoplasm of breast: Secondary | ICD-10-CM | POA: Insufficient documentation

## 2021-07-17 ENCOUNTER — Other Ambulatory Visit: Payer: Self-pay

## 2021-07-17 ENCOUNTER — Ambulatory Visit: Payer: Medicare PPO | Admitting: Family Medicine

## 2021-07-17 ENCOUNTER — Encounter: Payer: Self-pay | Admitting: Family Medicine

## 2021-07-17 VITALS — BP 128/65 | HR 70 | Temp 98.7°F | Resp 18 | Ht 69.0 in | Wt 180.0 lb

## 2021-07-17 DIAGNOSIS — F418 Other specified anxiety disorders: Secondary | ICD-10-CM | POA: Diagnosis not present

## 2021-07-17 DIAGNOSIS — Z23 Encounter for immunization: Secondary | ICD-10-CM | POA: Diagnosis not present

## 2021-07-17 DIAGNOSIS — I10 Essential (primary) hypertension: Secondary | ICD-10-CM | POA: Diagnosis not present

## 2021-07-17 DIAGNOSIS — F411 Generalized anxiety disorder: Secondary | ICD-10-CM | POA: Diagnosis not present

## 2021-07-17 DIAGNOSIS — E663 Overweight: Secondary | ICD-10-CM

## 2021-07-17 DIAGNOSIS — Z716 Tobacco abuse counseling: Secondary | ICD-10-CM | POA: Diagnosis not present

## 2021-07-17 DIAGNOSIS — R7303 Prediabetes: Secondary | ICD-10-CM

## 2021-07-17 DIAGNOSIS — F17220 Nicotine dependence, chewing tobacco, uncomplicated: Secondary | ICD-10-CM | POA: Diagnosis not present

## 2021-07-17 DIAGNOSIS — E785 Hyperlipidemia, unspecified: Secondary | ICD-10-CM

## 2021-07-17 DIAGNOSIS — Z720 Tobacco use: Secondary | ICD-10-CM | POA: Diagnosis not present

## 2021-07-17 NOTE — Patient Instructions (Addendum)
Annual exam in office with MD in November, call if you need me sooner.  Flu vaccine today  Please schedule retinal eye exam in office diabetic screening at checkout  Please work on stopping snuff  Please send for mammogram report done 07/13/2021  Excellent labs and blood pressure  It is important that you exercise regularly at least 30 minutes 5 times a week. If you develop chest pain, have severe difficulty breathing, or feel very tired, stop exercising immediately and seek medical attention   Think about what you will eat, plan ahead. Choose " clean, green, fresh or frozen" over canned, processed or packaged foods which are more sugary, salty and fatty. 70 to 75% of food eaten should be vegetables and fruit. Three meals at set times with snacks allowed between meals, but they must be fruit or vegetables. Aim to eat over a 12 hour period , example 7 am to 7 pm, and STOP after  your last meal of the day. Drink water,generally about 64 ounces per day, no other drink is as healthy. Fruit juice is best enjoyed in a healthy way, by EATING the fruit.   Thanks for choosing [facility], we consider it a privelige to serve you.

## 2021-07-17 NOTE — Assessment & Plan Note (Signed)
Controlled, no change in medication  

## 2021-07-17 NOTE — Assessment & Plan Note (Signed)
Controlled, no change in medication DASH diet and commitment to daily physical activity for a minimum of 30 minutes discussed and encouraged, as a part of hypertension management. The importance of attaining a healthy weight is also discussed.  BP/Weight 07/17/2021 06/25/2021 05/23/2021 03/14/2021 02/16/2021 0000000 [id number]  Systolic BP 0000000 [id number] 0000000 [id number]  Diastolic BP 65 73 58 69 62 77 75  Wt. (Lbs) 180.04 183.8 188 196 189 193.8 196  BMI 26.59 27.14 27.76 28.94 27.91 28.62 28.94

## 2021-07-17 NOTE — Assessment & Plan Note (Signed)
Hyperlipidemia:Low fat diet discussed and encouraged.   Lipid Panel  Lab Results  Component Value Date   CHOL 115 06/29/2021   HDL 57 06/29/2021   LDLCALC 41 06/29/2021   TRIG 87 06/29/2021   CHOLHDL 2.0 06/29/2021    '

## 2021-07-17 NOTE — Assessment & Plan Note (Signed)
Asked:confirms currently dips snuff Assess: Unwilling to set a quit date, but is cutting back Advise: needs to QUIT to reduce risk of cancer, cardio and cerebrovascular disease Assist: counseled for 5 minutes  Arrange: follow up in 2 to 4 months

## 2021-07-17 NOTE — Assessment & Plan Note (Addendum)
Patient educated about the importance of limiting  Carbohydrate intake , the need to commit to daily physical activity for a minimum of 30 minutes , and to commit weight loss. The fact that changes in all these areas will reduce or eliminate all together the development of diabetes is stressed.  Not as well controlled  Diabetic Labs Latest Ref Rng & Units 06/29/2021 03/01/2021 02/14/2021 12/24/2020 11/30/2020  HbA1c 4.8 - 5.6 % 6.1(H) - - - 5.8(H)  Microalbumin Not Estab. ug/mL - - - - -  Micro/Creat Ratio 0 - 29 mg/g creat - - - - -  Chol 100 - 199 mg/dL 115 207(H) - - -  HDL >39 mg/dL 57 62 - - -  Calc LDL 0 - 99 mg/dL 41 118(H) - - -  Triglycerides 0 - 149 mg/dL 87 152(H) - - -  Creatinine 0.57 - 1.00 mg/dL 1.15(H) 1.01(H) 0.92 0.83 0.86   BP/Weight 07/17/2021 06/25/2021 05/23/2021 03/14/2021 02/16/2021 0000000 [id number]  Systolic BP 0000000 [id number] 0000000 [id number]  Diastolic BP 65 73 58 69 62 77 75  Wt. (Lbs) 180.04 183.8 188 196 189 193.8 196  BMI 26.59 27.14 27.76 28.94 27.91 28.62 28.94   Foot/eye exam completion dates Latest Ref Rng & Units 09/12/2020 01/24/2020  Eye Exam No Retinopathy - No Retinopathy  Foot Form Completion - Done -

## 2021-07-17 NOTE — Progress Notes (Signed)
Sophia [name]     MRN: [id number]      DOB: December 13, 1950   HPI Sophia [name] is here for follow up and re-evaluation of chronic medical conditions, medication management and review of any available recent lab and radiology data.  Preventive health is updated, specifically  Cancer screening and Immunization.   Has worked hard on lifestyle modification and feels well, also taking new cholesterol medication. The PT denies any adverse reactions to current medications since the last visit.  Still working  on quitting snuff. ROS Denies recent fever or chills. Denies sinus pressure, nasal congestion, ear pain or sore throat. Denies chest congestion, productive cough or wheezing. Denies chest pains, palpitations and leg swelling Denies abdominal pain, nausea, vomiting,diarrhea or constipation.   Denies dysuria, frequency, hesitancy or incontinence. Denies  uncontrolled joint pain, swelling and limitation in mobility. Denies headaches, seizures, numbness, or tingling. Denies depression, anxiety or insomnia. Denies skin break down or rash.   PE  BP 128/65   Pulse 70   Temp 98.7 F (37.1 C)   Resp 18   Ht '5\' 9"'$  (1.753 m)   Wt 180 lb 0.6 oz (81.7 kg)   SpO2 94%   BMI 26.59 kg/m   Patient alert and oriented and in no cardiopulmonary distress.  HEENT: No facial asymmetry, EOMI,     Neck supple .  Chest: Clear to auscultation bilaterally.  CVS: S1, S2 no murmurs, no S3.Regular rate.  ABD: Soft non tender.   Ext: No edema  MS: Adequate ROM spine, shoulders, hips and knees.  Skin: Intact, no ulcerations or rash noted.  Psych: Good eye contact, normal affect. Memory intact not anxious or depressed appearing.  CNS: CN 2-12 intact, power,  normal throughout.no focal deficits noted.   Assessment & Plan  Depression with anxiety Controlled, no change in medication   Current nicotine use Asked:confirms currently dips snuff Assess: Unwilling to set a quit date, but is cutting  back Advise: needs to QUIT to reduce risk of cancer, cardio and cerebrovascular disease Assist: counseled for 5 minutes  Arrange: follow up in 2 to 4 months   Essential hypertension Controlled, no change in medication DASH diet and commitment to daily physical activity for a minimum of 30 minutes discussed and encouraged, as a part of hypertension management. The importance of attaining a healthy weight is also discussed.  BP/Weight 07/17/2021 06/25/2021 05/23/2021 03/14/2021 02/16/2021 0000000 [id number]  Systolic BP 0000000 [id number] 0000000 [id number]  Diastolic BP 65 73 58 69 62 77 75  Wt. (Lbs) 180.04 183.8 188 196 189 193.8 196  BMI 26.59 27.14 27.76 28.94 27.91 28.62 28.94       Prediabetes Patient educated about the importance of limiting  Carbohydrate intake , the need to commit to daily physical activity for a minimum of 30 minutes , and to commit weight loss. The fact that changes in all these areas will reduce or eliminate all together the development of diabetes is stressed.  Not as well controlled  Diabetic Labs Latest Ref Rng & Units 06/29/2021 03/01/2021 02/14/2021 12/24/2020 11/30/2020  HbA1c 4.8 - 5.6 % 6.1(H) - - - 5.8(H)  Microalbumin Not Estab. ug/mL - - - - -  Micro/Creat Ratio 0 - 29 mg/g creat - - - - -  Chol 100 - 199 mg/dL 115 207(H) - - -  HDL >39 mg/dL 57 62 - - -  Calc LDL 0 - 99 mg/dL 41 118(H) - - -  Triglycerides 0 -  149 mg/dL 87 152(H) - - -  Creatinine 0.57 - 1.00 mg/dL 1.15(H) 1.01(H) 0.92 0.83 0.86   BP/Weight 07/17/2021 06/25/2021 05/23/2021 03/14/2021 02/16/2021 0000000 [id number]  Systolic BP 0000000 [id number] 0000000 [id number]  Diastolic BP 65 73 58 69 62 77 75  Wt. (Lbs) 180.04 183.8 188 196 189 193.8 196  BMI 26.59 27.14 27.76 28.94 27.91 28.62 28.94   Foot/eye exam completion dates Latest Ref Rng & Units 09/12/2020 01/24/2020  Eye Exam No Retinopathy - No Retinopathy  Foot Form Completion - Done -        GAD (generalized anxiety disorder) Controlled, no change in  medication   Hyperlipidemia with target LDL less than 100 Hyperlipidemia:Low fat diet discussed and encouraged.   Lipid Panel  Lab Results  Component Value Date   CHOL 115 06/29/2021   HDL 57 06/29/2021   LDLCALC 41 06/29/2021   TRIG 87 06/29/2021   CHOLHDL 2.0 06/29/2021    '    Overweight (BMI 25.0-29.9)  Patient re-educated about  the importance of commitment to a  minimum of 150 minutes of exercise per week as able.  The importance of healthy food choices with portion control discussed, as well as eating regularly and within a 12 hour window most days. The need to choose "clean , green" food 50 to 75% of the time is discussed, as well as to make water the primary drink and set a goal of 64 ounces water daily.    Weight /BMI 07/17/2021 06/25/2021 05/23/2021  WEIGHT 180 lb 0.6 oz 183 lb 12.8 oz 188 lb  HEIGHT '5\' 9"'$  '5\' 9"'$  '5\' 9"'$   BMI 26.59 kg/m2 27.14 kg/m2 27.76 kg/m2

## 2021-07-17 NOTE — Assessment & Plan Note (Signed)
  Patient re-educated about  the importance of commitment to a  minimum of 150 minutes of exercise per week as able.  The importance of healthy food choices with portion control discussed, as well as eating regularly and within a 12 hour window most days. The need to choose "clean , green" food 50 to 75% of the time is discussed, as well as to make water the primary drink and set a goal of 64 ounces water daily.    Weight /BMI 07/17/2021 06/25/2021 05/23/2021  WEIGHT 180 lb 0.6 oz 183 lb 12.8 oz 188 lb  HEIGHT '5\' 9"'$  '5\' 9"'$  '5\' 9"'$   BMI 26.59 kg/m2 27.14 kg/m2 27.76 kg/m2

## 2021-07-25 ENCOUNTER — Other Ambulatory Visit: Payer: Self-pay

## 2021-07-25 ENCOUNTER — Ambulatory Visit (INDEPENDENT_AMBULATORY_CARE_PROVIDER_SITE_OTHER): Payer: Medicare PPO

## 2021-07-25 DIAGNOSIS — Z0000 Encounter for general adult medical examination without abnormal findings: Secondary | ICD-10-CM | POA: Diagnosis not present

## 2021-07-25 NOTE — Progress Notes (Signed)
Subjective:   Sophia [name] is a 70 y.o. female who presents for Medicare Annual (Subsequent) preventive examination.I connected with  Coolidge Breeze on 07/25/21 by a audio enabled telemedicine application and verified that I am speaking with the correct person using two identifiers.   I discussed the limitations of evaluation and management by telemedicine. The patient expressed understanding and agreed to proceed.   Location of patient:Home  Location of Provider:Office  Persons participating in virtual visit: Sophia [name] (patient) and Edgar Frisk, CMA  Review of Systems    Defer to PCP       Objective:    Today's Vitals   07/25/21 1403  PainSc: 0-No pain   There is no height or weight on file to calculate BMI.  Advanced Directives 07/25/2021 02/16/2021 11/27/2020 07/17/2020 08/31/2019 08/29/2019 08/29/2019  Does Patient Have a Medical Advance Directive? No No No No No No No  Would patient like information on creating a medical advance directive? - Yes (MAU/Ambulatory/Procedural Areas - Information given) No - Patient declined Yes (ED - Information included in AVS) - - No - Patient declined  Pre-existing out of facility DNR order (yellow form or pink MOST form) - - - - - - -    Current Medications (verified) Outpatient Encounter Medications as of 07/25/2021  Medication Sig   acetaminophen (TYLENOL) 650 MG CR tablet Take 650 mg by mouth 2 (two) times daily.   amLODipine (NORVASC) 10 MG tablet TAKE 1 TABLET EVERY DAY   aspirin EC 81 MG tablet Take 81 mg by mouth daily. Swallow whole.   benazepril-hydrochlorthiazide (LOTENSIN HCT) 20-12.5 MG tablet TAKE 2 TABLETS EVERY DAY   buPROPion (WELLBUTRIN XL) 150 MG 24 hr tablet Take 1 tablet (150 mg total) by mouth daily.   busPIRone (BUSPAR) 7.5 MG tablet TAKE 1 TABLET THREE TIMES DAILY (DOSE INCREASE) (Patient taking differently: Take 7.5 mg by mouth 3 (three) times daily.)   chlorthalidone (HYGROTON) 25 MG tablet Take 1 tablet (25 mg total)  by mouth daily.   Cyanocobalamin (VITAMIN B-12 PO) Take 2 gummies daily.   FLUoxetine (PROZAC) 40 MG capsule TAKE 1 CAPSULE EVERY DAY   gabapentin (NEURONTIN) 100 MG capsule TAKE 1 CAPSULE THREE TIMES DAILY   Multiple Vitamin (MULTIVITAMIN) tablet Take 1 tablet by mouth daily.   Omega-3 Fatty Acids (FISH OIL) 1360 MG CAPS Take by mouth. Pt takes '1400mg'$  daily   omeprazole (PRILOSEC) 40 MG capsule TAKE 1 CAPSULE EVERY DAY   polyethylene glycol (MIRALAX / GLYCOLAX) 17 g packet Take 17 g by mouth daily as needed.   psyllium (REGULOID) 0.52 g capsule Take 1 capsule by mouth daily.   rosuvastatin (CRESTOR) 20 MG tablet Take 1 tablet (20 mg total) by mouth daily.   zinc gluconate 50 MG tablet Take 50 mg by mouth daily.   [DISCONTINUED] chlorthalidone (HYGROTON) 25 MG tablet TAKE ONE TABLET BY MOUTH ONCE DAILY.   No facility-administered encounter medications on file as of 07/25/2021.    Allergies (verified) Patient has no known allergies.   History: Past Medical History:  Diagnosis Date   Acute respiratory disease due to COVID-19 virus 08/28/2019   Anxiety    Arthritis    Depression    Diabetes mellitus type 2 in obese ([place]) 11/28/2010   Qualifier: Diagnosis of  By: Moshe Cipro MD, Margaret  Diet controlled in 12/2016    Diabetes mellitus without complication ([place])    GERD (gastroesophageal reflux disease)    Hypercholesteremia    Hypertension  PONV (postoperative nausea and vomiting)    Sleep apnea    Past Surgical History:  Procedure Laterality Date   ABDOMINAL HYSTERECTOMY  11/18/1980   tubes and womb, bleeding and ectopic   ABDOMINAL SURGERY     removal of tumors   BALLOON DILATION N/A 02/16/2021   Procedure: BALLOON DILATION;  Surgeon: [name], DO;  [facility];  Service: Endoscopy;  Laterality: N/A;   BIOPSY  02/16/2021   Procedure: BIOPSY;  Surgeon: [name], DO;  [facility];  Service: Endoscopy;;   BREAST SURGERY Left 11/18/1981    benign tumor   CATARACT EXTRACTION W/PHACO Right 01/11/2014   Procedure: CATARACT EXTRACTION PHACO AND INTRAOCULAR LENS PLACEMENT (IOC);  Surgeon: Elta Guadeloupe T. Gershon Crane, MD;  Location: [facility];  Service: Ophthalmology;  Laterality: Right;  CDE 5.57   CATARACT EXTRACTION W/PHACO Left 01/25/2014   Procedure: CATARACT EXTRACTION PHACO AND INTRAOCULAR LENS PLACEMENT (IOC);  Surgeon: Elta Guadeloupe T. Gershon Crane, MD;  Location: [facility];  Service: Ophthalmology;  Laterality: Left;  CDE:5.19   COLONOSCOPY  2007 [facility]   NL EXAM   COLONOSCOPY N/A 04/22/2014   Dr. Fields:Normal mucosa in the terminal ileum/Two COLON polyps REMOVED/ Mild diverticulosis in the ascending colon and transverse colon/The LEFT colon IS redundant/Small internal hemorrhoids. Path: tubular adenoma. Next colonoscopy in 5-10 years   COLONOSCOPY WITH PROPOFOL N/A 11/27/2020   Surgeon: [name], DO;  Nonbleeding internal hemorrhoids, one 6 mm transverse colon polyp resected, diverticulosis in the sigmoid colon.  Pathology with tubular adenoma.  Repeat in 5 years.   ESOPHAGOGASTRODUODENOSCOPY N/A 04/22/2014   Dr. Fields:MICROCYTIC ANEMIA MOST LIKELY DUE TO ASA/VOLTAREN/Small hiatal hernia/MODERATE Non-erosive gastritis. Negative H.pylori   ESOPHAGOGASTRODUODENOSCOPY (EGD) WITH PROPOFOL N/A 02/16/2021   Surgeon: [name], DO; benign-appearing esophageal stenosis s/p dilation, gastritis biopsied.  Biopsies were benign.   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 Gershon Crane, MD;  Location: [facility];  Service: Ophthalmology;  Laterality: Right;   YAG LASER APPLICATION Left [id number]   Procedure: YAG LASER APPLICATION;  Surgeon: Rutherford Guys, MD;  Location: [facility];  Service: Ophthalmology;  Laterality: Left;   Family History  Problem Relation Age of Onset   Colon polyps Sister 20   Diabetes Sister    Hypertension Sister    Kidney disease Sister    Arthritis Father    Cancer Father        prostate    Hypertension Father    Diabetes Sister    Hypertension Sister    CAD Paternal Grandmother    Colon cancer Neg Hx    Social History   Socioeconomic History   Marital status: Married    Spouse name: Not on file   Number of children: Not on file   Years  of education: Not on file   Highest education level: Not on file  Occupational History   Not on file  Tobacco Use   Smoking status: Former    Packs/day: 0.25    Years: 20.00    Pack years: 5.00    Types: Cigarettes    Quit date: 01/05/1990    Years since quitting: 31.5   Smokeless tobacco: Current    Types: Snuff  Vaping Use   Vaping Use: Never used  Substance and Sexual Activity   Alcohol use: No   Drug use: Yes    Types: Marijuana    Comment: occ marijuana   Sexual activity: Not Currently    Birth control/protection: Surgical  Other Topics Concern   Not on file  Social History Narrative   Not on file   Social Determinants of Health   Financial Resource Strain: Low Risk    Difficulty of Paying Living Expenses: Not hard at all  Food Insecurity: No Food Insecurity   Worried About Charity fundraiser in the Last Year: Never true   Ran Out of Food in the Last Year: Never true  Transportation Needs: No Transportation Needs   Lack of  Transportation (Medical): No   Lack of Transportation (Non-Medical): No  Physical Activity: Sufficiently Active   Days of Exercise per Week: 7 days   Minutes of Exercise per Session: 30 min  Stress: No Stress Concern Present   Feeling of Stress : Only a little  Social Connections: Moderately Integrated   Frequency of Communication with Friends and Family: More than three times a week   Frequency of Social Gatherings with Friends and Family: Twice a week   Attends Religious Services: More than 4 times per year   Active Member of [employer] or Organizations: No   Attends Music therapist: Never   Marital Status: Married    Tobacco Counseling Ready to quit: Not Answered Counseling given: Not Answered   Clinical Intake:  Pre-visit preparation completed: Yes  Pain : No/denies pain Pain Score: 0-No pain        How often do you need to have someone help you when you read instructions, pamphlets, or other written materials from your doctor or pharmacy?: 3 - Sometimes What is the last grade level you completed in school?: 8TH GRADE  Diabetic?Yes Nutrition Risk Assessment:  Has the patient had any N/V/D within the last 2 months?  No  Does the patient have any non-healing wounds?  No  Has the patient had any unintentional weight loss or weight gain?  No   Diabetes:  Is the patient diabetic?   Was previously diagnosed with Diabetes but it is now controlled and patient is no longer taking medication for diabetes.  If diabetic, was a CBG obtained today?  No  Did the patient bring in their glucometer from home?  No  How often do you monitor your CBG's? Not monitored.   Financial Strains and Diabetes Management:  Are you having any financial strains with the device, your supplies or your medication? No .  Does the patient want to be seen by Chronic Care Management for management of their diabetes?  No  Would the patient like to be referred to a Nutritionist or for Diabetic  Management?  No   Diabetic Exams:  Diabetic Eye Exam: Overdue for diabetic eye exam. Pt has been advised about the importance in completing this exam. Patient advised to call and schedule an eye exam. Diabetic Foot Exam: Completed  09/12/2020    Interpreter Needed?: No  Information entered by :: Lathyn Griggs J,CMA   Activities of Daily Living In your present state of health, do you have any difficulty performing the following activities: 07/25/2021  Hearing? N  Vision? N  Difficulty concentrating or making decisions? Y  Walking or climbing stairs? N  Dressing or bathing? N  Doing errands, shopping? N  Preparing Food and eating ? N  Using the Toilet? N  In the past six months, have you accidently leaked urine? N  Do you have problems with loss of bowel control? N  Managing your Medications? N  Managing your Finances? N  Housekeeping or managing your Housekeeping? N  Some recent data might be hidden    Patient Care Team: Fayrene Helper, MD as PCP - General Josue Hector, MD as PCP - Cardiology (Cardiology) Danie Binder, MD (Inactive) as Consulting Physician (Gastroenterology) Rutherford Guys, MD as Consulting Physician (Ophthalmology) Carole Civil, MD as Consulting Physician (Orthopedic Surgery) [name], DO as Consulting Physician (Internal Medicine)  Indicate any recent Medical Services you may have received from other than [facility] providers in the past year (date may be approximate).     Assessment:   This is a routine wellness examination for Sophia [name].  Hearing/Vision screen No results found.  Dietary issues and exercise activities discussed: Current Exercise Habits: Home exercise routine, Type of exercise: Other - see comments (BIKE), Time (Minutes): 30, Frequency (Times/Week): 7, Weekly Exercise (Minutes/Week): 210   Goals Addressed   None    Depression Screen PHQ 2/9 Scores 07/25/2021 07/17/2021 03/14/2021 01/16/2021 12/26/2020 12/05/2020 12/05/2020  PHQ -  2 Score 0 0 1 1 0 3 0  PHQ- 9 Score - - - - 3 12 -  Exception Documentation - - - - - - -    Fall Risk Fall Risk  07/25/2021 07/17/2021 03/14/2021 01/16/2021 12/26/2020  Falls in the past year? 0 - 0 0 0  Number falls in past yr: 0 0 0 0 0  Injury with Fall? 0 0 0 0 0  Risk for fall due to : No Fall Risks - No Fall Risks - No Fall Risks  Follow up Falls evaluation completed - Falls evaluation completed - Falls evaluation completed    FALL RISK PREVENTION PERTAINING TO THE HOME:  Any stairs in or around the home? Yes  If so, are there any without handrails? No  Home free of loose throw rugs in walkways, pet beds, electrical cords, etc? Yes  Adequate lighting in your home to reduce risk of falls? Yes   ASSISTIVE DEVICES UTILIZED TO PREVENT FALLS:  Life alert? No  Use of a cane, walker or w/c? No  Grab bars in the bathroom? Yes  Shower chair or bench in shower? Yes  Elevated toilet seat or a handicapped toilet? Yes   TIMED UP AND GO:  Was the test performed?  N/A .  Length of time to ambulate 10 feet: N/A sec.     Cognitive Function:     6CIT Screen 07/25/2021 07/17/2020 07/14/2019 07/06/2018  What Year? 0 points 0 points 0 points 0 points  What month? 0 points 0 points 0 points 0 points  What time? 0 points - 0 points 0 points  Count back from 20 0 points 4 points 0 points 0 points  Months in reverse 0 points - 4 points 4 points  Repeat phrase 10 points - 2 points 2 points  Total Score 10 - 6  6    Immunizations Immunization History  Administered Date(s) Administered   Fluad Quad(high Dose 65+) 08/16/2019, 08/04/2020, 07/17/2021   Influenza Split 08/19/2014   Influenza Whole 09/20/1996   Influenza,inj,Quad PF,6+ Mos 09/15/2015, 08/06/2016, 07/06/2018   Moderna SARS-COV2 Booster Vaccination 06/27/2021   Moderna Sars-Covid-2 Vaccination 01/12/2020, 02/09/2020, 09/18/2020   Pneumococcal Conjugate-13 01/25/2015   Pneumococcal Polysaccharide-23 11/28/2010, 03/25/2016   Tdap  04/29/2014   Zoster, Live 11/28/2010    TDAP status: Up to date  Flu Vaccine status: Up to date  Pneumococcal vaccine status: Up to date  Covid-19 vaccine status: Completed vaccines  Qualifies for Shingles Vaccine? Yes   Zostavax completed No   Shingrix Completed?: No.    Education has been provided regarding the importance of this vaccine. Patient has been advised to call insurance company to determine out of pocket expense if they have not yet received this vaccine. Advised may also receive vaccine at local pharmacy or Health Dept. Verbalized acceptance and understanding.  Screening Tests Health Maintenance  Topic Date Due   Zoster Vaccines- Shingrix (1 of 2) Never done   OPHTHALMOLOGY EXAM  01/23/2021   FOOT EXAM  09/12/2021   HEMOGLOBIN A1C  12/30/2021   MAMMOGRAM  07/14/2023   TETANUS/TDAP  04/29/2024   COLONOSCOPY (Pts 45-2yr Insurance coverage will need to be confirmed)  11/27/2025   INFLUENZA VACCINE  Completed   DEXA SCAN  Completed   COVID-19 Vaccine  Completed   Hepatitis C Screening  Completed   PNA vac Low Risk Adult  Completed   HPV VACCINES  Aged Out    Health Maintenance  Health Maintenance Due  Topic Date Due   Zoster Vaccines- Shingrix (1 of 2) Never done   OPHTHALMOLOGY EXAM  01/23/2021    Colorectal cancer screening: Type of screening: Colonoscopy. Completed 11/27/2020. Repeat every 5 years  Mammogram status: Completed 07/13/2021. Repeat every year  Bone Density status: Completed 11/26/2017. Results reflect: Bone density results: NORMAL. Repeat every 0 years.  Lung Cancer Screening: (Low Dose CT Chest recommended if Age 71-80years, 30 pack-year currently smoking OR have quit w/in 15years.) does qualify.   Lung Cancer Screening Referral: No  Additional Screening:  Hepatitis C Screening: does qualify; Completed 11/07/2015  Vision Screening: Recommended annual ophthalmology exams for early detection of glaucoma and other disorders of the  eye. Is the patient up to date with their annual eye exam?  No  Who is the provider or what is the name of the office in which the patient attends annual eye exams? Dr. SGershon CraneIf pt is not established with a provider, would they like to be referred to a provider to establish care? No .   Dental Screening: Recommended annual dental exams for proper oral hygiene  Community Resource Referral / Chronic Care Management: CRR required this visit?  No   CCM required this visit?  No      Plan:     I have personally reviewed and noted the following in the patient's chart:   Medical and social history Use of alcohol, tobacco or illicit drugs  Current medications and supplements including opioid prescriptions.  Functional ability and status Nutritional status Physical activity Advanced directives List of other physicians Hospitalizations, surgeries, and ER visits in previous 12 months Vitals Screenings to include cognitive, depression, and falls Referrals and appointments  In addition, I have reviewed and discussed with patient certain preventive protocols, quality metrics, and best practice recommendations. A written personalized care plan for preventive services as well as  general preventive health recommendations were provided to patient.     Edgar Frisk, [facility]   07/25/2021   Nurse Notes: Non Face to Face 30 minute visit Encounter    Sophia [name] , Thank you for taking time to come for your Medicare Wellness Visit. I appreciate your ongoing commitment to your health goals. Please review the following plan we discussed and let me know if I can assist you in the future.   These are the goals we discussed:  Goals      Blood Pressure < 140/90     DIET - REDUCE PORTION SIZE     LIFESTYLE - DECREASE FALLS RISK        This is a list of the screening recommended for you and due dates:  Health Maintenance  Topic Date Due   Zoster (Shingles) Vaccine (1 of 2) Never done   Eye exam  for diabetics  01/23/2021   Complete foot exam   09/12/2021   Hemoglobin A1C  12/30/2021   Mammogram  07/14/2023   Tetanus Vaccine  04/29/2024   Colon Cancer Screening  11/27/2025   Flu Shot  Completed   DEXA scan (bone density measurement)  Completed   COVID-19 Vaccine  Completed   Hepatitis C Screening: USPSTF Recommendation to screen - Ages 34-79 yo.  Completed   Pneumonia vaccines  Completed   HPV Vaccine  Aged Out   Patient advised AVS will be mailed by PCP office.

## 2021-07-25 NOTE — Patient Instructions (Signed)
Health Maintenance, Female Adopting a healthy lifestyle and getting preventive care are important in promoting health and wellness. Ask your health care provider about: The right schedule for you to have regular tests and exams. Things you can do on your own to prevent diseases and keep yourself healthy. What should I know about diet, weight, and exercise? Eat a healthy diet  Eat a diet that includes plenty of vegetables, fruits, low-fat dairy products, and lean protein. Do not eat a lot of foods that are high in solid fats, added sugars, or sodium. Maintain a healthy weight Body mass index (BMI) is used to identify weight problems. It estimates body fat based on height and weight. Your health care provider can help determine your BMI and help you achieve or maintain a healthy weight. Get regular exercise Get regular exercise. This is one of the most important things you can do for your health. Most adults should: Exercise for at least 150 minutes each week. The exercise should increase your heart rate and make you sweat (moderate-intensity exercise). Do strengthening exercises at least twice a week. This is in addition to the moderate-intensity exercise. Spend less time sitting. Even light physical activity can be beneficial. Watch cholesterol and blood lipids Have your blood tested for lipids and cholesterol at 71 years of age, then have this test every 5 years. Have your cholesterol levels checked more often if: Your lipid or cholesterol levels are high. You are older than 71 years of age. You are at high risk for heart disease. What should I know about cancer screening? Depending on your health history and family history, you may need to have cancer screening at various ages. This may include screening for: Breast cancer. Cervical cancer. Colorectal cancer. Skin cancer. Lung cancer. What should I know about heart disease, diabetes, and high blood pressure? Blood pressure and heart  disease High blood pressure causes heart disease and increases the risk of stroke. This is more likely to develop in people who have high blood pressure readings, are of African descent, or are overweight. Have your blood pressure checked: Every 3-5 years if you are 18-39 years of age. Every year if you are 40 years old or older. Diabetes Have regular diabetes screenings. This checks your fasting blood sugar level. Have the screening done: Once every three years after age 40 if you are at a normal weight and have a low risk for diabetes. More often and at a younger age if you are overweight or have a high risk for diabetes. What should I know about preventing infection? Hepatitis B If you have a higher risk for hepatitis B, you should be screened for this virus. Talk with your health care provider to find out if you are at risk for hepatitis B infection. Hepatitis C Testing is recommended for: Everyone born from 1945 through 1965. Anyone with known risk factors for hepatitis C. Sexually transmitted infections (STIs) Get screened for STIs, including gonorrhea and chlamydia, if: You are sexually active and are younger than 71 years of age. You are older than 71 years of age and your health care provider tells you that you are at risk for this type of infection. Your sexual activity has changed since you were last screened, and you are at increased risk for chlamydia or gonorrhea. Ask your health care provider if you are at risk. Ask your health care provider about whether you are at high risk for HIV. Your health care provider may recommend a prescription medicine   to help prevent HIV infection. If you choose to take medicine to prevent HIV, you should first get tested for HIV. You should then be tested every 3 months for as long as you are taking the medicine. Pregnancy If you are about to stop having your period (premenopausal) and you may become pregnant, seek counseling before you get  pregnant. Take 400 to 800 micrograms (mcg) of folic acid every day if you become pregnant. Ask for birth control (contraception) if you want to prevent pregnancy. Osteoporosis and menopause Osteoporosis is a disease in which the bones lose minerals and strength with aging. This can result in bone fractures. If you are 65 years old or older, or if you are at risk for osteoporosis and fractures, ask your health care provider if you should: Be screened for bone loss. Take a calcium or vitamin D supplement to lower your risk of fractures. Be given hormone replacement therapy (HRT) to treat symptoms of menopause. Follow these instructions at home: Lifestyle Do not use any products that contain nicotine or tobacco, such as cigarettes, e-cigarettes, and chewing tobacco. If you need help quitting, ask your health care provider. Do not use street drugs. Do not share needles. Ask your health care provider for help if you need support or information about quitting drugs. Alcohol use Do not drink alcohol if: Your health care provider tells you not to drink. You are pregnant, may be pregnant, or are planning to become pregnant. If you drink alcohol: Limit how much you use to 0-1 drink a day. Limit intake if you are breastfeeding. Be aware of how much alcohol is in your drink. In the [place] one drink equals one 12 oz bottle of beer (355 mL), one 5 oz glass of wine (148 mL), or one 1 oz glass of hard liquor (44 mL). General instructions Schedule regular health, dental, and eye exams. Stay current with your vaccines. Tell your health care provider if: You often feel depressed. You have ever been abused or do not feel safe at home. Summary Adopting a healthy lifestyle and getting preventive care are important in promoting health and wellness. Follow your health care provider's instructions about healthy diet, exercising, and getting tested or screened for diseases. Follow your health care provider's  instructions on monitoring your cholesterol and blood pressure. This information is not intended to replace advice given to you by your health care provider. Make sure you discuss any questions you have with your health care provider. Document Revised: 01/12/2021 Document Reviewed: 10/28/2018 Elsevier Patient Education  2022 [employer]  

## 2021-08-09 ENCOUNTER — Telehealth: Payer: Self-pay

## 2021-08-09 NOTE — Telephone Encounter (Signed)
[place] tech From [employer] called received prescription Benazepril-hydrochlorthiazide 20-12.5 mg and also has an active prescription for Chlorthalidone 25 mg. Which one is the correct one?  Call back # [phone] and fax # [phone].

## 2021-08-10 NOTE — Telephone Encounter (Signed)
Pharmacist notified both prescriptions are active on medication list.

## 2021-08-15 ENCOUNTER — Ambulatory Visit: Payer: Medicare PPO

## 2021-09-17 ENCOUNTER — Telehealth: Payer: Self-pay

## 2021-09-17 NOTE — Telephone Encounter (Signed)
error 

## 2021-09-18 ENCOUNTER — Encounter (INDEPENDENT_AMBULATORY_CARE_PROVIDER_SITE_OTHER): Payer: Self-pay

## 2021-09-18 ENCOUNTER — Other Ambulatory Visit: Payer: Self-pay

## 2021-09-18 ENCOUNTER — Ambulatory Visit (INDEPENDENT_AMBULATORY_CARE_PROVIDER_SITE_OTHER): Payer: Medicare PPO | Admitting: Family Medicine

## 2021-09-18 ENCOUNTER — Encounter: Payer: Self-pay | Admitting: Family Medicine

## 2021-09-18 VITALS — BP 126/51 | HR 86 | Ht 69.0 in | Wt 178.0 lb

## 2021-09-18 DIAGNOSIS — E785 Hyperlipidemia, unspecified: Secondary | ICD-10-CM | POA: Diagnosis not present

## 2021-09-18 DIAGNOSIS — I10 Essential (primary) hypertension: Secondary | ICD-10-CM

## 2021-09-18 DIAGNOSIS — R531 Weakness: Secondary | ICD-10-CM | POA: Diagnosis not present

## 2021-09-18 DIAGNOSIS — K529 Noninfective gastroenteritis and colitis, unspecified: Secondary | ICD-10-CM

## 2021-09-18 NOTE — Patient Instructions (Signed)
F/U as before, call if you need me sooner   You had acute viral gastroenteritis which is much improved, be careful what you eat and where  you eat!  Labs tomorrow, since feel weak, cBC, tSH, chem 7 and eGFR  Thanks for choosing [facility], we consider it a privelige to serve you.

## 2021-09-18 NOTE — Progress Notes (Signed)
Virtual Visit via Telephone Note  I connected with Sophia [name] on 09/18/21 at  4:40 PM EDT by telephone and verified that I am speaking with the correct person using two identifiers.  Locatio Patient: home Provider: office   I discussed the limitations, risks, security and privacy concerns of performing an evaluation and management service by telephone and the availability of in person appointments. I also discussed with the patient that there may be a patient responsible charge related to this service. The patient expressed understanding and agreed to proceed.   History of Present Illness: [place] from 10/20 to 10/25, ate hot dogs on 10/20, then became acutely ill. Had nausea, vomiting  and diarrhea Symptoms lasted for 5 days, now have completely resolved, however still feels very weak. She denies fever , chills urinary symptoms   Observations/Objective: BP (!) 126/51   Pulse 86   Ht '5\' 9"'  (1.753 m)   Wt 178 lb (80.7 kg)   BMI 26.29 kg/m  Good communication with no confusion and intact memory. Alert and oriented x 3 No signs of respiratory distress during speech   Assessment and Plan: Acute gastroenteritis Duration 5 days from 10/20 to 09/11/2021. Now completely resolved, however, /o generalized weakness, CBC, chem 7 and EGFr and TSH to be checked Educated re viral gastroenteritis and the need to keep adequately hydrated durint the acute period and avoid potentially contaminated food  Weakness C/o weakness following recent acute illness, check CBC,, TSH and chem 7     Follow Up Instructions:    I discussed the assessment and treatment plan with the patient. The patient was provided an opportunity to ask questions and all were answered. The patient agreed with the plan and demonstrated an understanding of the instructions.   The patient was advised to call back or seek an in-person evaluation if the symptoms worsen or if the condition fails to improve as  anticipated.  I provided 11 minutes of non-face-to-face time during this encounter.   Tula Nakayama, MD

## 2021-09-19 ENCOUNTER — Encounter: Payer: Self-pay | Admitting: Family Medicine

## 2021-09-19 DIAGNOSIS — R531 Weakness: Secondary | ICD-10-CM | POA: Insufficient documentation

## 2021-09-19 DIAGNOSIS — D539 Nutritional anemia, unspecified: Secondary | ICD-10-CM | POA: Diagnosis not present

## 2021-09-19 DIAGNOSIS — I10 Essential (primary) hypertension: Secondary | ICD-10-CM | POA: Diagnosis not present

## 2021-09-19 NOTE — Assessment & Plan Note (Signed)
Duration 5 days from 10/20 to 09/11/2021. Now completely resolved, however, /o generalized weakness, CBC, chem 7 and EGFr and TSH to be checked Educated re viral gastroenteritis and the need to keep adequately hydrated durint the acute period and avoid potentially contaminated food

## 2021-09-19 NOTE — Assessment & Plan Note (Signed)
C/o weakness following recent acute illness, check CBC,, TSH and chem 7

## 2021-09-20 LAB — CBC
Hematocrit: 32.8 % — ABNORMAL LOW (ref 34.0–46.6)
Hemoglobin: 10.1 g/dL — ABNORMAL LOW (ref 11.1–15.9)
MCH: 25.3 pg — ABNORMAL LOW (ref 26.6–33.0)
MCHC: 30.8 g/dL — ABNORMAL LOW (ref 31.5–35.7)
MCV: 82 fL (ref 79–97)
Platelets: 362 10*3/uL (ref 150–450)
RBC: 3.99 x10E6/uL (ref 3.77–5.28)
RDW: 14.3 % (ref 11.7–15.4)
WBC: 7.9 10*3/uL (ref 3.4–10.8)

## 2021-09-20 LAB — BMP8+EGFR
BUN/Creatinine Ratio: 25 (ref 12–28)
BUN: 23 mg/dL (ref 8–27)
CO2: 33 mmol/L — ABNORMAL HIGH (ref 20–29)
Calcium: 9.9 mg/dL (ref 8.7–10.3)
Chloride: 98 mmol/L (ref 96–106)
Creatinine, Ser: 0.92 mg/dL (ref 0.57–1.00)
Glucose: 101 mg/dL — ABNORMAL HIGH (ref 70–99)
Potassium: 3.9 mmol/L (ref 3.5–5.2)
Sodium: 146 mmol/L — ABNORMAL HIGH (ref 134–144)
eGFR: 67 mL/min/{1.73_m2} (ref >59)

## 2021-09-20 LAB — TSH: TSH: 0.998 u[IU]/mL (ref 0.450–4.500)

## 2021-09-22 LAB — FERRITIN: Ferritin: 546 ng/mL — ABNORMAL HIGH (ref 15–150)

## 2021-09-22 LAB — SPECIMEN STATUS REPORT

## 2021-09-22 LAB — IRON: Iron: 35 ug/dL (ref 27–139)

## 2021-10-15 ENCOUNTER — Other Ambulatory Visit: Payer: Self-pay | Admitting: Family Medicine

## 2021-10-17 ENCOUNTER — Encounter: Payer: Medicare PPO | Admitting: Family Medicine

## 2021-11-09 ENCOUNTER — Other Ambulatory Visit: Payer: Self-pay

## 2021-11-09 ENCOUNTER — Ambulatory Visit (INDEPENDENT_AMBULATORY_CARE_PROVIDER_SITE_OTHER): Payer: Medicare PPO | Admitting: Family Medicine

## 2021-11-09 ENCOUNTER — Encounter: Payer: Self-pay | Admitting: Family Medicine

## 2021-11-09 VITALS — BP 130/72 | HR 88 | Resp 16 | Ht 69.0 in | Wt 180.0 lb

## 2021-11-09 DIAGNOSIS — R7303 Prediabetes: Secondary | ICD-10-CM

## 2021-11-09 DIAGNOSIS — Z720 Tobacco use: Secondary | ICD-10-CM

## 2021-11-09 DIAGNOSIS — M549 Dorsalgia, unspecified: Secondary | ICD-10-CM

## 2021-11-09 DIAGNOSIS — Z0000 Encounter for general adult medical examination without abnormal findings: Secondary | ICD-10-CM

## 2021-11-09 DIAGNOSIS — D539 Nutritional anemia, unspecified: Secondary | ICD-10-CM

## 2021-11-09 NOTE — Patient Instructions (Addendum)
F/U in mid March call if you need me sooner  CBC, iron and ferrtin today  TSH, fasting lipid, chem 7 and EGFR and hBa1C 3 to 5 days before March appt  Reduce gabapentin to one twice daily for two weeks, then one daily for 2 weeks, then stop taking. Only if you have uncontrolled pain, then you start again  Thanks for choosing [facility], we consider it a privelige to serve you.

## 2021-11-10 LAB — FERRITIN: Ferritin: 463 ng/mL — ABNORMAL HIGH (ref 15–150)

## 2021-11-10 LAB — CBC
Hematocrit: 36 % (ref 34.0–46.6)
Hemoglobin: 11.6 g/dL (ref 11.1–15.9)
MCH: 26.5 pg — ABNORMAL LOW (ref 26.6–33.0)
MCHC: 32.2 g/dL (ref 31.5–35.7)
MCV: 82 fL (ref 79–97)
Platelets: 228 10*3/uL (ref 150–450)
RBC: 4.38 x10E6/uL (ref 3.77–5.28)
RDW: 14.5 % (ref 11.7–15.4)
WBC: 6 10*3/uL (ref 3.4–10.8)

## 2021-11-10 LAB — IRON: Iron: 47 ug/dL (ref 27–139)

## 2021-11-11 ENCOUNTER — Encounter: Payer: Self-pay | Admitting: Family Medicine

## 2021-11-11 DIAGNOSIS — D539 Nutritional anemia, unspecified: Secondary | ICD-10-CM | POA: Insufficient documentation

## 2021-11-11 NOTE — Assessment & Plan Note (Signed)
Check Hb , iron and ferritin

## 2021-11-11 NOTE — Assessment & Plan Note (Signed)
Plan to taper off of gabapentin over next 4 to 6 weeks if able

## 2021-11-11 NOTE — Progress Notes (Signed)
Sophia [name]     MRN: [id number]      DOB: [birth year]  HPI: Patient is in for annual physical exam. Meds are reviewed and plan is to taper off gabapentin if possible Concern re iron and ferritin is addresed with lab update Recent labs,  are reviewed. Immunization is reviewed , and  updated if needed.   PE: BP 130/72    Pulse 88    Resp 16    Ht 5\' 9"  (1.753 m)    Wt 180 lb (81.6 kg)    SpO2 95%    BMI 26.58 kg/m   Pleasant  female, alert and oriented x 3, in no cardio-pulmonary distress. Afebrile. HEENT No facial trauma or asymetry. Sinuses non tender.  Extra occullar muscles intact.. External ears normal, . Neck: supple, no adenopathy,JVD or thyromegaly.No bruits.  Chest: Clear to ascultation bilaterally.No crackles or wheezes. Non tender to palpation    Cardiovascular system; Heart sounds normal,  S1 and  S2 ,no S3.  No murmur, or thrill. Apical beat not displaced Peripheral pulses normal.  Abdomen: Soft, non tender, no organomegaly or masses. No bruits. Bowel sounds normal. No guarding, tenderness or rebound.       Musculoskeletal exam: Decreased  ROM of spine, hips , shoulders and knees. No deformity ,swelling or crepitus noted. No muscle wasting or atrophy.   Neurologic: Cranial nerves 2 to 12 intact. Power, tone ,sensation and reflexes normal throughout. No disturbance in gait. No tremor.  Skin: Intact, no ulceration, erythema , scaling or rash noted. Pigmentation normal throughout  Psych; Normal mood and affect. Judgement and concentration normal   Assessment & Plan:  Annual physical exam Annual exam as documented. Counseling done  re healthy lifestyle involving commitment to 150 minutes exercise per week, heart healthy diet, and attaining healthy weight.The importance of adequate sleep also discussed. Regular seat belt use and home safety, is also discussed. Changes in health habits are decided on by the patient with goals and time  frames  set for achieving them. Immunization and cancer screening needs are specifically addressed at this visit.   Deficiency anemia Check Hb , iron and ferritin  Current nicotine use Asked:confirms currently dips snuff Assess: Unwilling to set a quit date, but is cutting back Advise: needs to QUIT to reduce risk of cancer, cardio and cerebrovascular disease Assist: counseled for 5 minutes and literature provided Arrange: follow up in 2 to 4 months   Prediabetes Patient educated about the importance of limiting  Carbohydrate intake , the need to commit to daily physical activity for a minimum of 30 minutes , and to commit weight loss. The fact that changes in all these areas will reduce or eliminate all together the development of diabetes is stressed.   Diabetic Labs Latest Ref Rng & Units 09/19/2021 06/29/2021 03/01/2021 02/14/2021 12/24/2020  HbA1c 4.8 - 5.6 % - 6.1(H) - - -  Microalbumin Not Estab. ug/mL - - - - -  Micro/Creat Ratio 0 - 29 mg/g creat - - - - -  Chol 100 - 199 mg/dL - 115 207(H) - -  HDL >39 mg/dL - 57 62 - -  Calc LDL 0 - 99 mg/dL - 41 118(H) - -  Triglycerides 0 - 149 mg/dL - 87 152(H) - -  Creatinine 0.57 - 1.00 mg/dL 0.92 1.15(H) 1.01(H) 0.92 0.83   BP/Weight 11/09/2021 09/18/2021 07/17/2021 06/25/2021 05/23/2021 12/12/5807 07/26/3381  Systolic BP 505 397 673 419 379 024 097  Diastolic BP 72 51  65 73 58 69 62  Wt. (Lbs) 180 178 180.04 183.8 188 196 189  BMI 26.58 26.29 26.59 27.14 27.76 28.94 27.91   Foot/eye exam completion dates Latest Ref Rng & Units 11/09/2021 09/12/2020  Eye Exam No Retinopathy - -  Foot Form Completion - Done Done      Back pain with radiation Plan to taper off of gabapentin over next 4 to 6 weeks if able

## 2021-11-11 NOTE — Assessment & Plan Note (Signed)

## 2021-11-11 NOTE — Assessment & Plan Note (Signed)
Asked:confirms currently dips snuff Assess: Unwilling to set a quit date, but is cutting back Advise: needs to QUIT to reduce risk of cancer, cardio and cerebrovascular disease Assist: counseled for 5 minutes and literature provided Arrange: follow up in 2 to 4 months

## 2021-11-11 NOTE — Assessment & Plan Note (Signed)
Patient educated about the importance of limiting  Carbohydrate intake , the need to commit to daily physical activity for a minimum of 30 minutes , and to commit weight loss. The fact that changes in all these areas will reduce or eliminate all together the development of diabetes is stressed.   Diabetic Labs Latest Ref Rng & Units 09/19/2021 06/29/2021 03/01/2021 02/14/2021 12/24/2020  HbA1c 4.8 - 5.6 % - 6.1(H) - - -  Microalbumin Not Estab. ug/mL - - - - -  Micro/Creat Ratio 0 - 29 mg/g creat - - - - -  Chol 100 - 199 mg/dL - 115 207(H) - -  HDL >39 mg/dL - 57 62 - -  Calc LDL 0 - 99 mg/dL - 41 118(H) - -  Triglycerides 0 - 149 mg/dL - 87 152(H) - -  Creatinine 0.57 - 1.00 mg/dL 0.92 1.15(H) 1.01(H) 0.92 0.83   BP/Weight 11/09/2021 09/18/2021 07/17/2021 06/25/2021 05/23/2021 0/93/2671 12/23/5807  Systolic BP 983 382 505 397 673 419 379  Diastolic BP 72 51 65 73 58 69 62  Wt. (Lbs) 180 178 180.04 183.8 188 196 189  BMI 26.58 26.29 26.59 27.14 27.76 28.94 27.91   Foot/eye exam completion dates Latest Ref Rng & Units 11/09/2021 09/12/2020  Eye Exam No Retinopathy - -  Foot Form Completion - Done Done

## 2022-01-14 ENCOUNTER — Other Ambulatory Visit: Payer: Self-pay | Admitting: Family Medicine

## 2022-01-24 ENCOUNTER — Other Ambulatory Visit: Payer: Self-pay

## 2022-01-24 DIAGNOSIS — R7303 Prediabetes: Secondary | ICD-10-CM | POA: Diagnosis not present

## 2022-01-24 DIAGNOSIS — E785 Hyperlipidemia, unspecified: Secondary | ICD-10-CM | POA: Diagnosis not present

## 2022-01-25 LAB — BMP8+EGFR
BUN/Creatinine Ratio: 34 — ABNORMAL HIGH (ref 12–28)
BUN: 30 mg/dL — ABNORMAL HIGH (ref 8–27)
CO2: 29 mmol/L (ref 20–29)
Calcium: 10 mg/dL (ref 8.7–10.3)
Chloride: 98 mmol/L (ref 96–106)
Creatinine, Ser: 0.87 mg/dL (ref 0.57–1.00)
Glucose: 91 mg/dL (ref 70–99)
Potassium: 3.6 mmol/L (ref 3.5–5.2)
Sodium: 141 mmol/L (ref 134–144)
eGFR: 71 mL/min/{1.73_m2} (ref >59)

## 2022-01-25 LAB — LIPID PANEL
Chol/HDL Ratio: 2.2 ratio (ref 0.0–4.4)
Cholesterol, Total: 143 mg/dL (ref 100–199)
HDL: 65 mg/dL (ref >39)
LDL Chol Calc (NIH): 60 mg/dL (ref 0–99)
Triglycerides: 96 mg/dL (ref 0–149)
VLDL Cholesterol Cal: 18 mg/dL (ref 5–40)

## 2022-01-25 LAB — HEMOGLOBIN A1C
Est. average glucose Bld gHb Est-mCnc: 123 mg/dL
Hgb A1c MFr Bld: 5.9 % — ABNORMAL HIGH (ref 4.8–5.6)

## 2022-01-25 LAB — TSH: TSH: 1.74 u[IU]/mL (ref 0.450–4.500)

## 2022-01-31 ENCOUNTER — Encounter: Payer: Self-pay | Admitting: Family Medicine

## 2022-01-31 ENCOUNTER — Ambulatory Visit (INDEPENDENT_AMBULATORY_CARE_PROVIDER_SITE_OTHER): Payer: Medicare HMO | Admitting: Family Medicine

## 2022-01-31 ENCOUNTER — Other Ambulatory Visit: Payer: Self-pay

## 2022-01-31 VITALS — BP 128/68 | HR 78 | Ht 69.0 in | Wt 179.0 lb

## 2022-01-31 DIAGNOSIS — E663 Overweight: Secondary | ICD-10-CM

## 2022-01-31 DIAGNOSIS — F418 Other specified anxiety disorders: Secondary | ICD-10-CM

## 2022-01-31 DIAGNOSIS — Z1231 Encounter for screening mammogram for malignant neoplasm of breast: Secondary | ICD-10-CM

## 2022-01-31 DIAGNOSIS — F411 Generalized anxiety disorder: Secondary | ICD-10-CM

## 2022-01-31 DIAGNOSIS — I10 Essential (primary) hypertension: Secondary | ICD-10-CM | POA: Diagnosis not present

## 2022-01-31 DIAGNOSIS — E785 Hyperlipidemia, unspecified: Secondary | ICD-10-CM

## 2022-01-31 DIAGNOSIS — R7303 Prediabetes: Secondary | ICD-10-CM

## 2022-01-31 DIAGNOSIS — K219 Gastro-esophageal reflux disease without esophagitis: Secondary | ICD-10-CM | POA: Diagnosis not present

## 2022-01-31 NOTE — Assessment & Plan Note (Signed)
?  Patient re-educated about  the importance of commitment to a  minimum of 150 minutes of exercise per week as able. ? ?The importance of healthy food choices with portion control discussed, as well as eating regularly and within a 12 hour window most days. ?The need to choose "clean , green" food 50 to 75% of the time is discussed, as well as to make water the primary drink and set a goal of 64 ounces water daily. ? ?  ?Weight /BMI 01/31/2022 11/09/2021 09/18/2021  ?WEIGHT 179 lb 0.6 oz 180 lb 178 lb  ?HEIGHT '5\' 9"'$  '5\' 9"'$  '5\' 9"'$   ?BMI 26.44 kg/m2 26.58 kg/m2 26.29 kg/m2  ? ? ? ?

## 2022-01-31 NOTE — Progress Notes (Signed)
? ?AZULA ZAPPIA     MRN: [id number]      DOB: Aug 24, 1951 ? ? ?HPI ?Ms. Epling is here for follow up and re-evaluation of chronic medical conditions, medication management and review of any available recent lab and radiology data.  ?Preventive health is updated, specifically  Cancer screening and Immunization.   ?Questions or concerns regarding consultations or procedures which the PT has had in the interim are  addressed. ?The PT denies any adverse reactions to current medications since the last visit.  ?There are no new concerns.  ?There are no specific complaints  ? ?ROS ?Denies recent fever or chills. ?Denies sinus pressure, nasal congestion, ear pain or sore throat. ?Denies chest congestion, productive cough or wheezing. ?Denies chest pains, palpitations and leg swelling ?Denies abdominal pain, nausea, vomiting,diarrhea or constipation.   ?Denies dysuria, frequency, hesitancy or incontinence. ?Denies joint pain, swelling and limitation in mobility. ?Denies headaches, seizures, numbness, or tingling. ?Denies depression, anxiety or insomnia. ?Denies skin break down or rash. ? ? ?PE ? ?BP 128/68   Pulse 78   Ht '5\' 9"'$  (1.753 m)   Wt 179 lb 0.6 oz (81.2 kg)   SpO2 95%   BMI 26.44 kg/m?  ? ?Patient alert and oriented and in no cardiopulmonary distress. ? ?HEENT: No facial asymmetry, EOMI,     Neck supple . ? ?Chest: Clear to auscultation bilaterally. ? ?CVS: S1, S2 no murmurs, no S3.Regular rate. ? ?ABD: Soft non tender.  ? ?Ext: No edema ? ?MS: Adequate ROM spine, shoulders, hips and knees. ? ?Skin: Intact, no ulcerations or rash noted. ? ?Psych: Good eye contact, normal affect. Memory intact not anxious or depressed appearing. ? ?CNS: CN 2-12 intact, power,  normal throughout.no focal deficits noted. ? ? ?Assessment & Plan ? ?Essential hypertension ?Controlled, no change in medication ?DASH diet and commitment to daily physical activity for a minimum of 30 minutes discussed and encouraged, as a part of  hypertension management. ?The importance of attaining a healthy weight is also discussed. ? ?BP/Weight 01/31/2022 11/09/2021 09/18/2021 07/17/2021 06/25/2021 05/23/2021 03/14/2021  ?Systolic BP 858 850 277 412 115 114 128  ?Diastolic BP 68 72 51 65 73 58 69  ?Wt. (Lbs) 179.04 180 178 180.04 183.8 188 196  ?BMI 26.44 26.58 26.29 26.59 27.14 27.76 28.94  ? ? ? ? ? ?Hyperlipidemia with target LDL less than 100 ?Hyperlipidemia:Low fat diet discussed and encouraged. ? ? ?Lipid Panel  ?Lab Results  ?Component Value Date  ? CHOL 143 01/24/2022  ? HDL 65 01/24/2022  ? [place] 60 01/24/2022  ? TRIG 96 01/24/2022  ? CHOLHDL 2.2 01/24/2022  ? ?Controlled, no change in medication ? ? ? ? ?Prediabetes ?Patient educated about the importance of limiting  Carbohydrate intake , the need to commit to daily physical activity for a minimum of 30 minutes , and to commit weight loss. ?The fact that changes in all these areas will reduce or eliminate all together the development of diabetes is stressed.  ?Improved, she is applauded on this ? ?Diabetic Labs Latest Ref Rng & Units 01/24/2022 09/19/2021 06/29/2021 03/01/2021 02/14/2021  ?HbA1c 4.8 - 5.6 % 5.9(H) - 6.1(H) - -  ?Microalbumin Not Estab. ug/mL - - - - -  ?Micro/Creat Ratio 0 - 29 mg/g creat - - - - -  ?Chol 100 - 199 mg/dL 143 - 115 207(H) -  ?HDL >39 mg/dL 65 - 57 62 -  ?Calc LDL 0 - 99 mg/dL 60 - 41 118(H) -  ?  Triglycerides 0 - 149 mg/dL 96 - 87 152(H) -  ?Creatinine 0.57 - 1.00 mg/dL 0.87 0.92 1.15(H) 1.01(H) 0.92  ? ?BP/Weight 01/31/2022 11/09/2021 09/18/2021 07/17/2021 06/25/2021 05/23/2021 03/14/2021  ?Systolic BP 440 102 725 366 115 114 128  ?Diastolic BP 68 72 51 65 73 58 69  ?Wt. (Lbs) 179.04 180 178 180.04 183.8 188 196  ?BMI 26.44 26.58 26.29 26.59 27.14 27.76 28.94  ? ?Foot/eye exam completion dates Latest Ref Rng & Units 11/09/2021 09/12/2020  ?Eye Exam No Retinopathy - -  ?Foot Form Completion - Done Done  ? ? ? ? ?Overweight (BMI 25.0-29.9) ? ?Patient re-educated about  the importance  of commitment to a  minimum of 150 minutes of exercise per week as able. ? ?The importance of healthy food choices with portion control discussed, as well as eating regularly and within a 12 hour window most days. ?The need to choose "clean , green" food 50 to 75% of the time is discussed, as well as to make water the primary drink and set a goal of 64 ounces water daily. ? ?  ?Weight /BMI 01/31/2022 11/09/2021 09/18/2021  ?WEIGHT 179 lb 0.6 oz 180 lb 178 lb  ?HEIGHT '5\' 9"'$  '5\' 9"'$  '5\' 9"'$   ?BMI 26.44 kg/m2 26.58 kg/m2 26.29 kg/m2  ? ? ? ? ?Depression with anxiety ?Controlled, no change in medication ? ? ?GAD (generalized anxiety disorder) ?Controlled, no change in medication ? ? ?Gastroesophageal reflux disease ?Controlled, no change in medication ? ?  ?

## 2022-01-31 NOTE — Assessment & Plan Note (Signed)
Patient educated about the importance of limiting  Carbohydrate intake , the need to commit to daily physical activity for a minimum of 30 minutes , and to commit weight loss. ?The fact that changes in all these areas will reduce or eliminate all together the development of diabetes is stressed.  ?Improved, she is applauded on this ? ?Diabetic Labs Latest Ref Rng & Units 01/24/2022 09/19/2021 06/29/2021 03/01/2021 02/14/2021  ?HbA1c 4.8 - 5.6 % 5.9(H) - 6.1(H) - -  ?Microalbumin Not Estab. ug/mL - - - - -  ?Micro/Creat Ratio 0 - 29 mg/g creat - - - - -  ?Chol 100 - 199 mg/dL 143 - 115 207(H) -  ?HDL >39 mg/dL 65 - 57 62 -  ?Calc LDL 0 - 99 mg/dL 60 - 41 118(H) -  ?Triglycerides 0 - 149 mg/dL 96 - 87 152(H) -  ?Creatinine 0.57 - 1.00 mg/dL 0.87 0.92 1.15(H) 1.01(H) 0.92  ? ?BP/Weight 01/31/2022 11/09/2021 09/18/2021 07/17/2021 06/25/2021 05/23/2021 03/14/2021  ?Systolic BP 875 643 329 518 115 114 128  ?Diastolic BP 68 72 51 65 73 58 69  ?Wt. (Lbs) 179.04 180 178 180.04 183.8 188 196  ?BMI 26.44 26.58 26.29 26.59 27.14 27.76 28.94  ? ?Foot/eye exam completion dates Latest Ref Rng & Units 11/09/2021 09/12/2020  ?Eye Exam No Retinopathy - -  ?Foot Form Completion - Done Done  ? ? ? ?

## 2022-01-31 NOTE — Assessment & Plan Note (Signed)
Hyperlipidemia:Low fat diet discussed and encouraged. ? ? ?Lipid Panel  ?Lab Results  ?Component Value Date  ? CHOL 143 01/24/2022  ? HDL 65 01/24/2022  ? [place] 60 01/24/2022  ? TRIG 96 01/24/2022  ? CHOLHDL 2.2 01/24/2022  ? ?Controlled, no change in medication ? ? ? ?

## 2022-01-31 NOTE — Assessment & Plan Note (Signed)
Controlled, no change in medication ?DASH diet and commitment to daily physical activity for a minimum of 30 minutes discussed and encouraged, as a part of hypertension management. ?The importance of attaining a healthy weight is also discussed. ? ?BP/Weight 01/31/2022 11/09/2021 09/18/2021 07/17/2021 06/25/2021 05/23/2021 03/14/2021  ?Systolic BP 423 536 144 315 115 114 128  ?Diastolic BP 68 72 51 65 73 58 69  ?Wt. (Lbs) 179.04 180 178 180.04 183.8 188 196  ?BMI 26.44 26.58 26.29 26.59 27.14 27.76 28.94  ? ? ? ? ?

## 2022-01-31 NOTE — Assessment & Plan Note (Signed)
Controlled, no change in medication  

## 2022-01-31 NOTE — Patient Instructions (Signed)
Follow-up end  August call if you need me sooner.  Flu vaccine at that visit. ? ?Congrats on excellent labs and blood pressure. ? ?Congrats on quitting snuff  x3 weeks. ? ? ? ?Please schedule August Mammogram at checkout. ? ?No changes in medication at this time. ? ?Please get the COVID booster and your 2 Shingrix vaccines at the pharmacy. ? ?It is important that you exercise regularly at least 30 minutes 5 times a week. If you develop chest pain, have severe difficulty breathing, or feel very tired, stop exercising immediately and seek medical attention  ? ?Thanks for choosing [facility], we consider it a privelige to serve you. ? ?

## 2022-03-04 ENCOUNTER — Other Ambulatory Visit: Payer: Self-pay | Admitting: Family Medicine

## 2022-03-25 DIAGNOSIS — H52203 Unspecified astigmatism, bilateral: Secondary | ICD-10-CM | POA: Diagnosis not present

## 2022-03-25 DIAGNOSIS — H5203 Hypermetropia, bilateral: Secondary | ICD-10-CM | POA: Diagnosis not present

## 2022-03-25 DIAGNOSIS — H524 Presbyopia: Secondary | ICD-10-CM | POA: Diagnosis not present

## 2022-03-25 DIAGNOSIS — E119 Type 2 diabetes mellitus without complications: Secondary | ICD-10-CM | POA: Diagnosis not present

## 2022-03-25 DIAGNOSIS — Z961 Presence of intraocular lens: Secondary | ICD-10-CM | POA: Diagnosis not present

## 2022-03-25 LAB — HM DIABETES EYE EXAM

## 2022-04-02 ENCOUNTER — Telehealth: Payer: Self-pay | Admitting: Family Medicine

## 2022-04-02 NOTE — Telephone Encounter (Signed)
Pls call and advise that checking on her  ask generally about how she is doing / stress/ depression or anxiety issues. If reports doing well, with no concerns still offer an appt for her to come in for eval in next 1 to 2 weeks, I do treat her depression ?Thanks ?( Niece is not on DPR, Marijean Heath who sent the message of concern) ?

## 2022-04-03 NOTE — Telephone Encounter (Signed)
Appt made for Friday  °

## 2022-04-04 ENCOUNTER — Encounter: Payer: Self-pay | Admitting: Family Medicine

## 2022-04-04 ENCOUNTER — Ambulatory Visit (INDEPENDENT_AMBULATORY_CARE_PROVIDER_SITE_OTHER): Payer: Medicare HMO | Admitting: Family Medicine

## 2022-04-04 DIAGNOSIS — F411 Generalized anxiety disorder: Secondary | ICD-10-CM

## 2022-04-04 DIAGNOSIS — F32A Depression, unspecified: Secondary | ICD-10-CM

## 2022-04-04 DIAGNOSIS — F17228 Nicotine dependence, chewing tobacco, with other nicotine-induced disorders: Secondary | ICD-10-CM

## 2022-04-04 DIAGNOSIS — F419 Anxiety disorder, unspecified: Secondary | ICD-10-CM

## 2022-04-04 MED ORDER — BUSPIRONE HCL 10 MG PO TABS: 10.0000 mg | ORAL_TABLET | Freq: Three times a day (TID) | ORAL | 3 refills | Status: DC

## 2022-04-04 NOTE — Progress Notes (Signed)
Virtual Visit via Telephone Note  I connected with Sophia [name] on 04/04/22 at  9:20 AM EDT by telephone and verified that I am speaking with the correct person using two identifiers.  Location: Patient: home Provider: office   I discussed the limitations, risks, security and privacy concerns of performing an evaluation and management service by telephone and the availability of in person appointments. I also discussed with the patient that there may be a patient responsible charge related to this service. The patient expressed understanding and agreed to proceed.   History of Present Illness: Fsther in long term care for over 2 years , but getting worse as far as dementia is concerned, does not recognize her an dspeaking only about people who are dead, in the past 2 weeks when she visited him, he did not recognixze her and this is upsetting her , also has unresolved anger / pain with regard to lack of involvement and support caring for her Dad Cries easily and gets angry easily, has started dipping snuff again for her nerves, wants to stop. Wants mental health help Not suicidal or homicidal. Overwhelmed and anxiety uncontrolled    Observations/Objective: There were no vitals taken for this visit. Good communication with no confusion and intact memory. Alert and oriented x 3 No signs of respiratory distress during speech, crying sporadically during the interview   Assessment and Plan: GAD (generalized anxiety disorder) Trigger is deteriorating health of her dad increase buspar dose and refer toTherapy  Nicotine dependence Asked:confirms currently dipping snuff Assess: Unwilling to set a quit date, but is cutting back Advise: needs to QUIT to reduce risk of cancer, cardio and cerebrovascular disease Assist: counseled for 5 minutes and literature provided Arrange: follow up in 2 to 4 months    Follow Up Instructions:    I discussed the assessment and treatment plan with the  patient. The patient was provided an opportunity to ask questions and all were answered. The patient agreed with the plan and demonstrated an understanding of the instructions.   The patient was advised to call back or seek an in-person evaluation if the symptoms worsen or if the condition fails to improve as anticipated.  I provided 14 minutes of non-face-to-face time during this encounter.   Tula Nakayama, MD

## 2022-04-04 NOTE — Assessment & Plan Note (Signed)
Asked:confirms currently dipping snuff Assess: Unwilling to set a quit date, but is cutting back Advise: needs to QUIT to reduce risk of cancer, cardio and cerebrovascular disease Assist: counseled for 5 minutes and literature provided Arrange: follow up in 2 to 4 months

## 2022-04-04 NOTE — Assessment & Plan Note (Signed)
Trigger is deteriorating health of her dad increase buspar dose and refer toTherapy

## 2022-04-04 NOTE — Patient Instructions (Signed)
F/u in office in 2 months, call if you need me sooner  You are referred to therapist  Dose increase in Buspar to 10 mg three times daily, STOP the 7.5 mg tablet. Medication is at [place]  Please stop dipping snuff again!  Thanks for choosing [facility], we consider it a privelige to serve you.

## 2022-04-05 ENCOUNTER — Telehealth: Payer: Self-pay | Admitting: *Deleted

## 2022-04-05 ENCOUNTER — Telehealth: Payer: Medicare HMO | Admitting: Family Medicine

## 2022-04-05 NOTE — Chronic Care Management (AMB) (Signed)
  Chronic Care Management   Note  04/05/2022 Name: Sophia [name] MRN: [id number] DOB: 12/14/1950  Sophia [name] is a 71 y.o. year old adult who is a primary care patient of Moshe Cipro Norwood Levo, MD. I reached out to [place] by phone today in response to a referral sent by Ms. [place] PCP.  [name] was given information about Chronic Care Management services today including:  CCM service includes personalized support from designated clinical staff supervised by her physician, including individualized plan of care and coordination with other care providers 24/7 contact phone numbers for assistance for urgent and routine care needs. Service will only be billed when office clinical staff spend 20 minutes or more in a month to coordinate care. Only one practitioner may furnish and bill the service in a calendar month. The patient may stop CCM services at any time (effective at the end of the month) by phone call to the office staff. The patient is responsible for co-pay (up to 20% after annual deductible is met) if co-pay is required by the individual health plan.   Patient agreed to services and verbal consent obtained.   Follow up plan: Face to Face appointment with care management team member scheduled for: 04/11/22  [facility]  Direct Dial: [phone]

## 2022-04-11 ENCOUNTER — Ambulatory Visit (INDEPENDENT_AMBULATORY_CARE_PROVIDER_SITE_OTHER): Payer: Medicare HMO

## 2022-04-11 DIAGNOSIS — I10 Essential (primary) hypertension: Secondary | ICD-10-CM

## 2022-04-11 DIAGNOSIS — K219 Gastro-esophageal reflux disease without esophagitis: Secondary | ICD-10-CM

## 2022-04-11 DIAGNOSIS — F410 Panic disorder [episodic paroxysmal anxiety] without agoraphobia: Secondary | ICD-10-CM

## 2022-04-11 DIAGNOSIS — F322 Major depressive disorder, single episode, severe without psychotic features: Secondary | ICD-10-CM

## 2022-04-11 DIAGNOSIS — T733XXA Exhaustion due to excessive exertion, initial encounter: Secondary | ICD-10-CM

## 2022-04-11 DIAGNOSIS — F411 Generalized anxiety disorder: Secondary | ICD-10-CM

## 2022-04-11 NOTE — Chronic Care Management (AMB) (Signed)
Chronic Care Management    Clinical Social Work Note  04/11/2022 Name: Sophia [name] MRN: [id number] DOB: 01/21/1951  Sophia [name] is a 71 y.o. year old adult who is a primary care patient of [name], Sophia E, MD. The CCM team was consulted to assist the patient with chronic disease management and/or care coordination needs related to: [employer] .   Engaged with patient face to face for initial visit in response to provider referral for social work chronic care management and care coordination services.   Consent to Services:  The patient was given the following information about Chronic Care Management services today, agreed to services, and gave verbal consent: 1. CCM service includes personalized support from designated clinical staff supervised by the primary care provider, including individualized plan of care and coordination with other care providers 2. 24/7 contact phone numbers for assistance for urgent and routine care needs. 3. Service will only be billed when office clinical staff spend 20 minutes or more in a month to coordinate care. 4. Only one practitioner may furnish and bill the service in a calendar month. 5.The patient may stop CCM services at any time (effective at the end of the month) by phone call to the office staff. 6. The patient will be responsible for cost sharing (co-pay) of up to 20% of the service fee (after annual deductible is met). Patient agreed to services and consent obtained.  Patient agreed to services and consent obtained.   Assessment: Review of patient past medical history, allergies, medications, and health status, including review of relevant consultants reports was performed today as part of a comprehensive evaluation and provision of chronic care management and care coordination services.     SDOH (Social Determinants of Health) assessments and interventions performed:  SDOH Interventions    Flowsheet Row Most Recent Value  SDOH  Interventions   Stress Interventions Provide Counseling  [client has stress related to care needs of her father]   Client had hx of using snuff. She said she stopped using snuff on 03/28/22. She said she does use marijuana occasionally.  She said it help her to relax. She has caregiver stress issues. She is concerned about her own health needs and concerned about needs medically of her father.     Advanced Directives Status: See Vynca application for related entries.  CCM Care Plan  No Known Allergies  Outpatient Encounter Medications as of 04/11/2022  Medication Sig Note   acetaminophen (TYLENOL) 650 MG CR tablet Take 650 mg by mouth 2 (two) times daily.    amLODipine (NORVASC) 10 MG tablet TAKE 1 TABLET EVERY DAY    aspirin EC 81 MG tablet Take 81 mg by mouth daily. Swallow whole.    benazepril-hydrochlorthiazide (LOTENSIN HCT) 20-12.5 MG tablet TAKE 2 TABLETS EVERY DAY    buPROPion (WELLBUTRIN XL) 150 MG 24 hr tablet TAKE 1 TABLET EVERY DAY    busPIRone (BUSPAR) 10 MG tablet Take 1 tablet (10 mg total) by mouth 3 (three) times daily.    chlorthalidone (HYGROTON) 25 MG tablet TAKE 1 TABLET EVERY DAY    Cyanocobalamin (VITAMIN B-12 PO) Take 2 gummies daily.    FLUoxetine (PROZAC) 40 MG capsule TAKE 1 CAPSULE EVERY DAY    gabapentin (NEURONTIN) 100 MG capsule TAKE 1 CAPSULE THREE TIMES DAILY (Patient not taking: Reported on 01/31/2022)    Multiple Vitamin (MULTIVITAMIN) tablet Take 1 tablet by mouth daily.    Omega-3 Fatty Acids (FISH OIL) 1360 MG CAPS Take by mouth.   Pt takes 1400mg daily    omeprazole (PRILOSEC) 40 MG capsule TAKE 1 CAPSULE EVERY DAY    OVER THE COUNTER MEDICATION 300 mg 2 (two) times daily. Omega XL    polyethylene glycol (MIRALAX / GLYCOLAX) 17 g packet Take 17 g by mouth daily as needed.    psyllium (REGULOID) 0.52 g capsule Take 1 capsule by mouth daily. (Patient not taking: Reported on 01/31/2022)    rosuvastatin (CRESTOR) 20 MG tablet Take 1 tablet (20 mg total) by  mouth daily.    zinc gluconate 50 MG tablet Take 50 mg by mouth daily. 07/17/2021: Pt reports taking med once a week   No facility-administered encounter medications on file as of 04/11/2022.    Patient Active Problem List   Diagnosis Date Noted   Deficiency anemia 11/11/2021   Weakness 09/19/2021   Fatigue 03/16/2021   At increased risk for cardiovascular disease 03/16/2021   Overweight (BMI 25.0-29.9) 03/16/2021   Esophageal dysphagia 01/31/2021   Neck muscle strain, initial encounter 12/26/2020   Panic anxiety syndrome 12/06/2020   Depression, major, single episode, in partial remission (HCC) 12/12/2019   Prediabetes 06/01/2019   GAD (generalized anxiety disorder) 12/03/2018   Leg cramps 06/15/2018   Tubular adenoma of colon 04/28/2014   Nicotine dependence 04/28/2014   History of rectal bleeding 02/19/2014   Back pain with radiation 10/05/2013   Seasonal allergies 05/01/2011   Palpitations 05/17/2009   Hyperlipidemia with target LDL less than 100 06/17/2008   Depression with anxiety 11/21/2006   Essential hypertension 11/21/2006   Gastroesophageal reflux disease 11/21/2006    Conditions to be addressed/monitored: monitor client management of anxiety issues  Care Plan : LCSW Care Plan  Updates made by Sophia [name], Sophia S, LCSW since 04/11/2022 12:00 AM     Problem: Anxiety Identification (Anxiety)      Goal: Anxiety Symptoms Identified. Manage anxiety symptoms . Manage depression symptoms   Start Date: 04/11/2022  Expected End Date: 07/15/2022  This Visit'[name] Progress: Not on track  Priority: Medium  Note:   Current Barriers:  Anxiety issues Depression issues Pain issues Suicidal Ideation/Homicidal Ideation: No  Clinical Social Work Goal([name]):  patient will work with SW monthly by telephone or in person to reduce or manage symptoms related to anxiety issues or stress issues Patient will communicate with RNCM as needed to discuss nursing needs of client Patient to  attend scheduled medical appointments in next 30 days  Interventions: Patient interviewed and appropriate assessments performed: GAD-7 and PHQ  2/9 1:1 collaboration with [name], Sophia E, MD regarding development and update of comprehensive plan of care as evidenced by provider attestation and co-signature Discussed client needs with Lagretta L Zakrzewski Discussed sleeping issues of client Provided counseling support for client Discussed anxiety issues of client . She has anxiety issues. She said she is taking medications as prescribed.  Discussed relaxation techniques of client. She likes to relax by listening to music. She likes playing games on her tablet to help her relax. Discussed medication procurement Discussed pain issues. She said she wears a back brace to help with housework.  She said she takes Omega XL to help with pain. She said she thinks it is helping  Reviewed health needs of her father. Her father is in SNF and has specific needs.        She is concerned over health needs of her father. He has memory issues. He is at [facility] in [place], [place].  Reviewed transport  needs. She drives as   needed to appointments Reviewed family support.  Encouraged Auri to talk with RNCM, Julie Farmer, as needed to discuss nursing needs of client Discussed with client CCM program support Discussed self care, relaxation of client. Reviewed deep breathing approach to manage stress. Discussed taking short nap to help with stress. Reviewed sleeping issues. She said she is resting well at present  Patient Coping Activities:  Enjoys listening to music Enjoys playing games on tablet Enjoys doing activities by herself  Patient Coping Strengths:  Takes medications as prescribed Has no transportation issues Attends scheduled medical appointments  Patient Deficits:  Anxiety issues Depression issues Pain issues   Patient Goals:  - spend time or talk with others at least 2 to 3 times per week -  practice relaxation or meditation daily - keep a calendar with appointment dates  Follow Up Plan: LCSW to call client on 05/16/22 at 1:00 PM    Sophia [name].Sophia [name] MSW, LCSW Licensed Clinical Social Worker [facility] Care Management [phone] 

## 2022-04-11 NOTE — Patient Instructions (Addendum)
Visit Information  Patient goals:   Anxiety symptoms monitored and managed. Manage anxiety issues. Manage depression issues  Time Frame:  Short Term Goal Priority : Medium Progress: Not On Track  Start Date: 04/11/22 End Date:  07/11/22  Follow Up Date:  05/16/22 at 1:00 PM  Anxiety symptoms Monitored and managed. Manage anxiety issues. Manage depression issues  Patient Coping Activities:  Enjoys listening to music Enjoys playing games on tablet Enjoys doing activities by herself  Patient Coping Strengths:  Takes medications as prescribed Has no transportation issues Attends scheduled medical appointments  Patient Deficits:  Anxiety issues Depression issues Pain issues   Patient Goals:  - spend time or talk with others at least 2 to 3 times per week - practice relaxation or meditation daily - keep a calendar with appointment dates  Follow Up Plan: LCSW to call client on 05/16/22 at 1:00 PM   Norva Riffle.Gaile Allmon MSW, [place] [employer] representative [facility] Care Management [phone]

## 2022-04-17 DIAGNOSIS — F32A Depression, unspecified: Secondary | ICD-10-CM

## 2022-04-17 DIAGNOSIS — I10 Essential (primary) hypertension: Secondary | ICD-10-CM

## 2022-05-16 ENCOUNTER — Other Ambulatory Visit: Payer: Self-pay | Admitting: Family Medicine

## 2022-05-16 ENCOUNTER — Ambulatory Visit (INDEPENDENT_AMBULATORY_CARE_PROVIDER_SITE_OTHER): Payer: Medicare HMO | Admitting: Licensed Clinical Social Worker

## 2022-05-16 DIAGNOSIS — E785 Hyperlipidemia, unspecified: Secondary | ICD-10-CM

## 2022-05-16 DIAGNOSIS — D539 Nutritional anemia, unspecified: Secondary | ICD-10-CM

## 2022-05-16 DIAGNOSIS — F410 Panic disorder [episodic paroxysmal anxiety] without agoraphobia: Secondary | ICD-10-CM

## 2022-05-16 DIAGNOSIS — I10 Essential (primary) hypertension: Secondary | ICD-10-CM

## 2022-05-16 DIAGNOSIS — T733XXA Exhaustion due to excessive exertion, initial encounter: Secondary | ICD-10-CM

## 2022-05-16 NOTE — Chronic Care Management (AMB) (Signed)
Chronic Care Management    Clinical Social Work Note  05/16/2022 Name: Sophia [name] MRN: [id number] DOB: [birth year]  Sophia [name] is a 71 y.o. year old adult who is a primary care patient of Moshe Cipro Norwood Levo, MD. The CCM team was consulted to assist the patient with chronic disease management and/or care coordination needs related to: [employer] .   Engaged with patient by telephone for follow up visit in response to provider referral for social work chronic care management and care coordination services.   Consent to Services:  The patient was given information about Chronic Care Management services, agreed to services, and gave verbal consent prior to initiation of services.  Please see initial visit note for detailed documentation.   Patient agreed to services and consent obtained.   Assessment: Review of patient past medical history, allergies, medications, and health status, including review of relevant consultants reports was performed today as part of a comprehensive evaluation and provision of chronic care management and care coordination services.     SDOH (Social Determinants of Health) assessments and interventions performed:  SDOH Interventions    Flowsheet Row Most Recent Value  SDOH Interventions   Stress Interventions Provide Counseling. Client has anxiety and stress management challenges  Depression Interventions/Treatment  Medication, Counseling        Advanced Directives Status: See Vynca application for related entries.  CCM Care Plan  No Known Allergies  Outpatient Encounter Medications as of 05/16/2022  Medication Sig Note   acetaminophen (TYLENOL) 650 MG CR tablet Take 650 mg by mouth 2 (two) times daily.    amLODipine (NORVASC) 10 MG tablet TAKE 1 TABLET EVERY DAY    aspirin EC 81 MG tablet Take 81 mg by mouth daily. Swallow whole.    benazepril-hydrochlorthiazide (LOTENSIN HCT) 20-12.5 MG tablet TAKE 2 TABLETS EVERY DAY    buPROPion  (WELLBUTRIN XL) 150 MG 24 hr tablet TAKE 1 TABLET EVERY DAY    busPIRone (BUSPAR) 10 MG tablet Take 1 tablet (10 mg total) by mouth 3 (three) times daily.    chlorthalidone (HYGROTON) 25 MG tablet TAKE 1 TABLET EVERY DAY    Cyanocobalamin (VITAMIN B-12 PO) Take 2 gummies daily.    FLUoxetine (PROZAC) 40 MG capsule TAKE 1 CAPSULE EVERY DAY    gabapentin (NEURONTIN) 100 MG capsule TAKE 1 CAPSULE THREE TIMES DAILY (Patient not taking: Reported on 01/31/2022)    Multiple Vitamin (MULTIVITAMIN) tablet Take 1 tablet by mouth daily.    Omega-3 Fatty Acids (FISH OIL) 1360 MG CAPS Take by mouth. Pt takes '1400mg'$  daily    omeprazole (PRILOSEC) 40 MG capsule TAKE 1 CAPSULE EVERY DAY    OVER THE COUNTER MEDICATION 300 mg 2 (two) times daily. Omega XL    polyethylene glycol (MIRALAX / GLYCOLAX) 17 g packet Take 17 g by mouth daily as needed.    psyllium (REGULOID) 0.52 g capsule Take 1 capsule by mouth daily. (Patient not taking: Reported on 01/31/2022)    rosuvastatin (CRESTOR) 20 MG tablet Take 1 tablet (20 mg total) by mouth daily.    zinc gluconate 50 MG tablet Take 50 mg by mouth daily. 07/17/2021: Pt reports taking med once a week   No facility-administered encounter medications on file as of 05/16/2022.    Patient Active Problem List   Diagnosis Date Noted   Deficiency anemia 11/11/2021   Weakness 09/19/2021   Fatigue 03/16/2021   At increased risk for cardiovascular disease 03/16/2021   Overweight (BMI 25.0-29.9) 03/16/2021  Esophageal dysphagia 01/31/2021   Neck muscle strain, initial encounter 12/26/2020   Panic anxiety syndrome 12/06/2020   Depression, major, single episode, in partial remission (Sophia [name]) 12/12/2019   Prediabetes 06/01/2019   GAD (generalized anxiety disorder) 12/03/2018   Leg cramps 06/15/2018   Tubular adenoma of colon 04/28/2014   Nicotine dependence 04/28/2014   History of rectal bleeding 02/19/2014   Back pain with radiation 10/05/2013   Seasonal allergies 05/01/2011    Palpitations 05/17/2009   Hyperlipidemia with target LDL less than 100 06/17/2008   Depression with anxiety 11/21/2006   Essential hypertension 11/21/2006   Gastroesophageal reflux disease 11/21/2006    Conditions to be addressed/monitored: monitor client management of anxiety issues  Care Plan : [facility]  Updates made by Katha Cabal, LCSW since 05/16/2022 12:00 AM     Problem: Anxiety Identification (Anxiety)      Goal: Anxiety Symptoms Identified. Manage anxiety symptoms . Manage depression symptoms   Start Date: 04/11/2022  Expected End Date: 08/12/2022  This Visit's Progress: On track  Recent Progress: Not on track  Priority: Medium  Note:   Current Barriers:  Anxiety issues Depression issues Pain issues Suicidal Ideation/Homicidal Ideation: No  Clinical Social Work Goal(s):  patient will work with SW monthly by telephone or in person to reduce or manage symptoms related to anxiety issues or stress issues Patient will communicate with RNCM as needed to discuss nursing needs of client Patient to attend scheduled medical appointments in next 30 days  Interventions: Patient interviewed and appropriate assessments performed: GAD-7 and PHQ  2/9 1:1 collaboration with Fayrene Helper, MD regarding development and update of comprehensive plan of care as evidenced by provider attestation and co-signature Discussed client needs with [place] B[place]ze Discussed sleeping issues of client. Sophia [name] said she is now sleeping better Provided counseling support for client Discussed anxiety issues of client . She said she feels that her mood has improved with latest medication adjustment. She said she is not feeling as nervious or anxious; she said she is sleeping better. Overall she thinks her mood has improved recently. Discussed medication procurement Discussed pain issues. She said she wears a back brace to help with housework.   Reviewed transport  needs. She drives as  needed to appointments Encouraged Sophia [name] to talk with RNCM, Jacqlyn Larsen, as needed to discuss nursing needs of client  Patient Coping Activities:  Enjoys listening to music Enjoys playing games on tablet Enjoys doing activities by herself  Patient Coping Strengths:  Takes medications as prescribed Has no transportation issues Attends scheduled medical appointments  Patient Deficits:  Anxiety issues Depression issues Pain issues   Patient Goals:  - spend time or talk with others at least 2 to 3 times per week - practice relaxation or meditation daily - keep a calendar with appointment dates  Follow Up Plan: LCSW to call client on 06/20/22 at 1:00 PM      Norva Riffle.Charmine Bockrath MSW, [place] [employer] representative [facility] Care Management [phone]

## 2022-05-16 NOTE — Patient Instructions (Addendum)
Visit Information  Patient Goals:  Anxiety Symptoms Monitored and Managed. Manage anxiety symptoms . Manage depression issues  Time Frame:  Short Term Goal Priority : Medium Progress: On Track  Start Date: 04/11/22 End Date:  08/12/22    Follow Up Date:  06/20/22 at 1:00 PM   Anxiety symptoms Monitored and managed. Manage anxiety issues. Manage depression issues  Patient Coping Activities:  Enjoys listening to music Enjoys playing games on tablet Enjoys doing activities by herself  Patient Coping Strengths:  Takes medications as prescribed Has no transportation issues Attends scheduled medical appointments  Patient Deficits:  Anxiety issues Depression issues Pain issues   Patient Goals:  - spend time or talk with others at least 2 to 3 times per week - practice relaxation or meditation daily - keep a calendar with appointment dates  Follow Up Plan: LCSW to call client on 06/20/22 at 1:00 PM    Sophia [name] MSW, [place] Holiday representative [facility] Care Management [phone]

## 2022-05-17 DIAGNOSIS — E785 Hyperlipidemia, unspecified: Secondary | ICD-10-CM

## 2022-05-17 DIAGNOSIS — I10 Essential (primary) hypertension: Secondary | ICD-10-CM

## 2022-05-17 DIAGNOSIS — D539 Nutritional anemia, unspecified: Secondary | ICD-10-CM

## 2022-05-31 ENCOUNTER — Other Ambulatory Visit: Payer: Self-pay | Admitting: Family Medicine

## 2022-05-31 DIAGNOSIS — R7303 Prediabetes: Secondary | ICD-10-CM

## 2022-05-31 DIAGNOSIS — E785 Hyperlipidemia, unspecified: Secondary | ICD-10-CM

## 2022-05-31 DIAGNOSIS — I10 Essential (primary) hypertension: Secondary | ICD-10-CM

## 2022-06-01 LAB — BMP8+EGFR
BUN/Creatinine Ratio: 33 — ABNORMAL HIGH (ref 12–28)
BUN: 35 mg/dL — ABNORMAL HIGH (ref 8–27)
CO2: 29 mmol/L (ref 20–29)
Calcium: 10.1 mg/dL (ref 8.7–10.3)
Chloride: 99 mmol/L (ref 96–106)
Creatinine, Ser: 1.07 mg/dL — ABNORMAL HIGH (ref 0.57–1.00)
Glucose: 99 mg/dL (ref 70–99)
Potassium: 4.1 mmol/L (ref 3.5–5.2)
Sodium: 143 mmol/L (ref 134–144)
eGFR: 55 mL/min/{1.73_m2} — ABNORMAL LOW (ref >59)

## 2022-06-01 LAB — LIPID PANEL
Chol/HDL Ratio: 2.3 ratio (ref 0.0–4.4)
Cholesterol, Total: 133 mg/dL (ref 100–199)
HDL: 58 mg/dL (ref >39)
LDL Chol Calc (NIH): 55 mg/dL (ref 0–99)
Triglycerides: 113 mg/dL (ref 0–149)
VLDL Cholesterol Cal: 20 mg/dL (ref 5–40)

## 2022-06-01 LAB — HEMOGLOBIN A1C
Est. average glucose Bld gHb Est-mCnc: 126 mg/dL
Hgb A1c MFr Bld: 6 % — ABNORMAL HIGH (ref 4.8–5.6)

## 2022-06-03 ENCOUNTER — Encounter: Payer: Self-pay | Admitting: Internal Medicine

## 2022-06-04 ENCOUNTER — Ambulatory Visit (INDEPENDENT_AMBULATORY_CARE_PROVIDER_SITE_OTHER): Payer: Medicare HMO | Admitting: Family Medicine

## 2022-06-04 ENCOUNTER — Encounter: Payer: Self-pay | Admitting: Family Medicine

## 2022-06-04 VITALS — BP 122/68 | HR 73 | Resp 16 | Ht 69.0 in | Wt 172.0 lb

## 2022-06-04 DIAGNOSIS — E785 Hyperlipidemia, unspecified: Secondary | ICD-10-CM

## 2022-06-04 DIAGNOSIS — E663 Overweight: Secondary | ICD-10-CM | POA: Diagnosis not present

## 2022-06-04 DIAGNOSIS — F411 Generalized anxiety disorder: Secondary | ICD-10-CM | POA: Diagnosis not present

## 2022-06-04 DIAGNOSIS — I10 Essential (primary) hypertension: Secondary | ICD-10-CM

## 2022-06-04 DIAGNOSIS — F324 Major depressive disorder, single episode, in partial remission: Secondary | ICD-10-CM | POA: Diagnosis not present

## 2022-06-04 MED ORDER — BUSPIRONE HCL 10 MG PO TABS: 10.0000 mg | ORAL_TABLET | Freq: Three times a day (TID) | ORAL | 3 refills | Status: DC

## 2022-06-04 MED ORDER — BUSPIRONE HCL 10 MG PO TABS
10.0000 mg | ORAL_TABLET | Freq: Three times a day (TID) | ORAL | 1 refills | Status: DC
Start: 2022-06-04 — End: 2022-06-04

## 2022-06-04 MED ORDER — ROSUVASTATIN CALCIUM 20 MG PO TABS: 20.0000 mg | ORAL_TABLET | Freq: Every day | ORAL | 1 refills | Status: DC

## 2022-06-04 NOTE — Assessment & Plan Note (Signed)
Improved, applauded on this and encouraged to continue same  Patient re-educated about  the importance of commitment to a  minimum of 150 minutes of exercise per week as able.  The importance of healthy food choices with portion control discussed, as well as eating regularly and within a 12 hour window most days. The need to choose "clean , green" food 50 to 75% of the time is discussed, as well as to make water the primary drink and set a goal of 64 ounces water daily.       06/04/2022   11:15 AM 01/31/2022   11:06 AM 11/09/2021   11:06 AM  Weight /BMI  Weight 172 lb 179 lb 0.6 oz 180 lb  Height '5\' 9"'$  (1.753 m) '5\' 9"'$  (1.753 m) '5\' 9"'$  (1.753 m)  BMI 25.4 kg/m2 26.44 kg/m2 26.58 kg/m2

## 2022-06-04 NOTE — Assessment & Plan Note (Signed)
Hyperlipidemia:Low fat diet discussed and encouraged.   Lipid Panel  Lab Results  Component Value Date   CHOL 133 05/31/2022   HDL 58 05/31/2022   LDLCALC 55 05/31/2022   TRIG 113 05/31/2022   CHOLHDL 2.3 05/31/2022     Controlled, no change in medication

## 2022-06-04 NOTE — Assessment & Plan Note (Signed)
Much improved, continue current meds, mag to therapist to stoip service per pt request

## 2022-06-04 NOTE — Assessment & Plan Note (Signed)
Controlled, no change in medication DASH diet and commitment to daily physical activity for a minimum of 30 minutes discussed and encouraged, as a part of hypertension management. The importance of attaining a healthy weight is also discussed.     06/04/2022   11:15 AM 01/31/2022   11:06 AM 11/09/2021   11:06 AM 09/18/2021    4:30 PM 07/17/2021    9:40 AM 06/25/2021    1:34 PM 05/23/2021    9:16 AM  BP/Weight  Systolic BP 641 583 094 076 808 811 031  Diastolic BP 68 68 72 51 65 73 58  Wt. (Lbs) 172 179.04 180 178 180.04 183.8 188  BMI 25.4 kg/m2 26.44 kg/m2 26.58 kg/m2 26.29 kg/m2 26.59 kg/m2 27.14 kg/m2 27.76 kg/m2

## 2022-06-04 NOTE — Progress Notes (Signed)
Sophia [name]     MRN: [id number]      DOB: 1950/12/07   HPI Sophia [name] is here for follow up and re-evaluation of chronic medical conditions, medication management and review of any available recent lab and radiology data.  Preventive health is updated, specifically  Cancer screening and Immunization.   Questions or concerns regarding consultations or procedures which the PT has had in the interim are  addressed.Had good session with therapist and feels she needs no more. Depression and anxiety and mood have all improved, very focused on and dedicated to her Dad's care The PT denies any adverse reactions to current medications since the last visit.  There are no new concerns.  There are no specific complaints   ROS Denies recent fever or chills. Denies sinus pressure, nasal congestion, ear pain or sore throat. Denies chest congestion, productive cough or wheezing. Denies chest pains, palpitations and leg swelling Denies abdominal pain, nausea, vomiting,diarrhea or constipation.   Denies dysuria, frequency, hesitancy or incontinence. Deniesuncontrolled  joint pain, swelling and limitation in mobility. Denies headaches, seizures, numbness, or tingling. Denies uncontrolled  depression, anxiety or insomnia. Denies skin break down or rash.   PE  BP 122/68   Pulse 73   Resp 16   Ht '5\' 9"'$  (1.753 m)   Wt 172 lb (78 kg)   SpO2 96%   BMI 25.40 kg/m   Patient alert and oriented and in no cardiopulmonary distress.  HEENT: No facial asymmetry, EOMI,     Neck supple .  Chest: Clear to auscultation bilaterally.  CVS: S1, S2 no murmurs, no S3.Regular rate.  ABD: Soft non tender.   Ext: No edema  MS: Adequate though reduced ROM spine, shoulders, hips and knees.  Skin: Intact, no ulcerations or rash noted.  Psych: Good eye contact, normal affect. Memory intact not anxious or depressed appearing.  CNS: CN 2-12 intact, power,  normal throughout.no focal deficits  noted.   Assessment & Plan  Depression, major, single episode, in partial remission ([place]) Much improved, continue current meds, mag to therapist to stoip service per pt request  GAD (generalized anxiety disorder) Controlled, no change in medication Will stop therapy  Hyperlipidemia with target LDL less than 100 Hyperlipidemia:Low fat diet discussed and encouraged.   Lipid Panel  Lab Results  Component Value Date   CHOL 133 05/31/2022   HDL 58 05/31/2022   LDLCALC 55 05/31/2022   TRIG 113 05/31/2022   CHOLHDL 2.3 05/31/2022     Controlled, no change in medication   Essential hypertension Controlled, no change in medication DASH diet and commitment to daily physical activity for a minimum of 30 minutes discussed and encouraged, as a part of hypertension management. The importance of attaining a healthy weight is also discussed.     06/04/2022   11:15 AM 01/31/2022   11:06 AM 11/09/2021   11:06 AM 09/18/2021    4:30 PM 07/17/2021    9:40 AM 06/25/2021    1:34 PM 05/23/2021    9:16 AM  BP/Weight  Systolic BP 626 948 546 270 350 093 818  Diastolic BP 68 68 72 51 65 73 58  Wt. (Lbs) 172 179.04 180 178 180.04 183.8 188  BMI 25.4 kg/m2 26.44 kg/m2 26.58 kg/m2 26.29 kg/m2 26.59 kg/m2 27.14 kg/m2 27.76 kg/m2       Overweight (BMI 25.0-29.9) Improved, applauded on this and encouraged to continue same  Patient re-educated about  the importance of commitment to a  minimum of  150 minutes of exercise per week as able.  The importance of healthy food choices with portion control discussed, as well as eating regularly and within a 12 hour window most days. The need to choose "clean , green" food 50 to 75% of the time is discussed, as well as to make water the primary drink and set a goal of 64 ounces water daily.       06/04/2022   11:15 AM 01/31/2022   11:06 AM 11/09/2021   11:06 AM  Weight /BMI  Weight 172 lb 179 lb 0.6 oz 180 lb  Height '5\' 9"'$  (1.753 m) '5\' 9"'$  (1.753 m) 5'  9" (1.753 m)  BMI 25.4 kg/m2 26.44 kg/m2 26.58 kg/m2

## 2022-06-04 NOTE — Assessment & Plan Note (Signed)
Controlled, no change in medication Will stop therapy

## 2022-06-04 NOTE — Patient Instructions (Signed)
Annual exam 12/28/ or after, call if you need me sooner  PLEASE ensure you drink 64 ounces of water every day, you are dehydrated  Non fast chem 7 and EGFr end August  No medication changes  Call and come for flu vaccine in the Fall  Continue to stay off of snuff  Thanks for choosing [facility], we consider it a privelige to serve you.

## 2022-06-19 ENCOUNTER — Other Ambulatory Visit: Payer: Self-pay | Admitting: Family Medicine

## 2022-06-20 ENCOUNTER — Ambulatory Visit: Payer: Medicare HMO | Admitting: Licensed Clinical Social Worker

## 2022-06-20 DIAGNOSIS — T733XXA Exhaustion due to excessive exertion, initial encounter: Secondary | ICD-10-CM

## 2022-06-20 DIAGNOSIS — K219 Gastro-esophageal reflux disease without esophagitis: Secondary | ICD-10-CM

## 2022-06-20 DIAGNOSIS — I10 Essential (primary) hypertension: Secondary | ICD-10-CM

## 2022-06-20 DIAGNOSIS — D539 Nutritional anemia, unspecified: Secondary | ICD-10-CM

## 2022-06-20 DIAGNOSIS — F324 Major depressive disorder, single episode, in partial remission: Secondary | ICD-10-CM

## 2022-06-20 DIAGNOSIS — F411 Generalized anxiety disorder: Secondary | ICD-10-CM

## 2022-06-20 DIAGNOSIS — F410 Panic disorder [episodic paroxysmal anxiety] without agoraphobia: Secondary | ICD-10-CM

## 2022-06-20 NOTE — Chronic Care Management (AMB) (Signed)
Chronic Care Management    Clinical Social Work Note  06/20/2022 Name: Sophia [name] MRN: [id number] DOB: 01/26/1951  Sophia [name] is a 71 y.o. year old adult who is a primary care patient of Moshe Cipro [place] Levo, Sophia [name]. The CCM team was consulted to assist the patient with chronic disease management and/or care coordination needs related to: [employer] .   Engaged with patient by telephone for follow up visit in response to provider referral for social work chronic care management and care coordination services.   Consent to Services:  The patient was given information about Chronic Care Management services, agreed to services, and gave verbal consent prior to initiation of services.  Please see initial visit note for detailed documentation.   Patient agreed to services and consent obtained.   Assessment: Review of patient past medical history, allergies, medications, and health status, including review of relevant consultants reports was performed today as part of a comprehensive evaluation and provision of chronic care management and care coordination services.     SDOH (Social Determinants of Health) assessments and interventions performed:  SDOH Interventions    Flowsheet Row Most Recent Value  SDOH Interventions   Stress Interventions Provide Counseling  [client has stress related to managing medical needs]  Depression Interventions/Treatment  Counseling        Advanced Directives Status: See Vynca application for related entries.  CCM Care Plan  No Known Allergies  Outpatient Encounter Medications as of 06/20/2022  Medication Sig Note   acetaminophen (TYLENOL) 650 MG CR tablet Take 650 mg by mouth 2 (two) times daily.    amLODipine (NORVASC) 10 MG tablet TAKE 1 TABLET EVERY DAY    aspirin EC 81 MG tablet Take 81 mg by mouth daily. Swallow whole.    benazepril-hydrochlorthiazide (LOTENSIN HCT) 20-12.5 MG tablet TAKE 2 TABLETS EVERY DAY    buPROPion (WELLBUTRIN XL)  150 MG 24 hr tablet TAKE 1 TABLET EVERY DAY    busPIRone (BUSPAR) 10 MG tablet Take 1 tablet (10 mg total) by mouth 3 (three) times daily.    chlorthalidone (HYGROTON) 25 MG tablet TAKE 1 TABLET EVERY DAY    Cyanocobalamin (VITAMIN B-12 PO) Take 2 gummies daily.    ferrous gluconate (FERGON) 324 MG tablet Take 324 mg by mouth daily with breakfast.    FLUoxetine (PROZAC) 40 MG capsule TAKE 1 CAPSULE EVERY DAY    Multiple Vitamin (MULTIVITAMIN) tablet Take 1 tablet by mouth daily.    omeprazole (PRILOSEC) 40 MG capsule TAKE 1 CAPSULE EVERY DAY    OVER THE COUNTER MEDICATION 300 mg 2 (two) times daily. Omega XL    polyethylene glycol (MIRALAX / GLYCOLAX) 17 g packet Take 17 g by mouth daily as needed.    rosuvastatin (CRESTOR) 20 MG tablet Take 1 tablet (20 mg total) by mouth daily.    zinc gluconate 50 MG tablet Take 50 mg by mouth daily. 07/17/2021: Pt reports taking med once a week   No facility-administered encounter medications on file as of 06/20/2022.    Patient Active Problem List   Diagnosis Date Noted   Deficiency anemia 11/11/2021   Weakness 09/19/2021   Fatigue 03/16/2021   At increased risk for cardiovascular disease 03/16/2021   Overweight (BMI 25.0-29.9) 03/16/2021   Esophageal dysphagia 01/31/2021   Neck muscle strain, initial encounter 12/26/2020   Panic anxiety syndrome 12/06/2020   Depression, major, single episode, in partial remission ([place]) 12/12/2019   Prediabetes 06/01/2019   GAD (generalized anxiety disorder) 12/03/2018  Leg cramps 06/15/2018   Tubular adenoma of colon 04/28/2014   Nicotine dependence 04/28/2014   History of rectal bleeding 02/19/2014   Back pain with radiation 10/05/2013   Seasonal allergies 05/01/2011   Palpitations 05/17/2009   Hyperlipidemia with target LDL less than 100 06/17/2008   Depression with anxiety 11/21/2006   Essential hypertension 11/21/2006   Gastroesophageal reflux disease 11/21/2006    Conditions to be  addressed/monitored: monitor anxiety needs of client  Care Plan : [place]  Updates made by Sophia Cabal, Sophia [name] since 06/20/2022 12:00 AM     Problem: Anxiety Identification (Anxiety)      Goal: Anxiety Symptoms Identified. Manage anxiety symptoms . Manage depression symptoms   Start Date: 04/11/2022  Expected End Date: 08/12/2022  This Visit's Progress: On track  Recent Progress: On track  Priority: Medium  Note:   Current Barriers:  Anxiety issues Depression issues Pain issues Suicidal Ideation/Homicidal Ideation: No  Clinical Social Work Goal(s):  patient will work with SW monthly by telephone or in person to reduce or manage symptoms related to anxiety issues or stress issues Patient to attend scheduled medical appointments in next 30 days  Interventions: 1:1 collaboration with Sophia Helper, Sophia [name] regarding development and update of comprehensive plan of care as evidenced by provider attestation and co-signature Discussed client needs with Sophia [name] Discussed sleeping issues of client. Sophia [name] said she is now sleeping better Provided counseling support for client Discussed anxiety issues of client . She said she feels that her mood has improved with latest medication adjustment. She said she is not feeling as nervious or anxious; she said she is sleeping better. Overall she thinks her mood has improved recently. Discussed medication procurement Discussed appetite of client Discussed current SW needs. Leshawn said she feels that she is stable related to her SW needs. She and Sophia [name] agreed for Sophia [name] to discharge her today from [place]. Sophia [name] also informed her of Care Coordination services through [facility]. She appreciated information about Care Coordination servcies Sophia [name] to discharge client today from [place]. Client agreed to this plan  Patient Coping Activities:  Enjoys listening to music Enjoys playing games on tablet Enjoys doing activities by  herself  Patient Coping Strengths:  Takes medications as prescribed Has no transportation issues Attends scheduled medical appointments  Patient Deficits:  Anxiety issues Depression issues Pain issues   Patient Goals:  - spend time or talk with others at least 2 to 3 times per week - practice relaxation or meditation daily - keep a calendar with appointment dates  Follow Up Plan: Sophia [name] is discharging client today from [place].Illona Bulman MSW, [place] [facility] representative [facility] Care Management [phone]

## 2022-06-20 NOTE — Patient Instructions (Addendum)
Visit Information  Patient Goals:  Anxiety symptoms monitored and managed. Manage anxiety issues. Manage depression issues  Time Frame:  Short Term Goal Priority : Medium Progress: On Track  Start Date: 04/11/22 End Date:  08/12/22    Follow Up Date:  LCSW is discharging client today from [place].    Anxiety symptoms Monitored and managed. Manage anxiety issues. Manage depression issues  Patient Coping Activities:  Enjoys listening to music Enjoys playing games on tablet Enjoys doing activities by herself  Patient Coping Strengths:  Takes medications as prescribed Has no transportation issues Attends scheduled medical appointments  Patient Deficits:  Anxiety issues Depression issues Pain issues   Patient Goals:  - spend time or talk with others at least 2 to 3 times per week - practice relaxation or meditation daily - keep a calendar with appointment dates  Follow Up Plan: LCSW is discharging client today from [place]. Client agreed to this plan .    Norva Riffle.Isadore Bokhari MSW, [place] [employer] representative [facility] Care Management [phone]

## 2022-07-16 ENCOUNTER — Ambulatory Visit: Payer: Medicare HMO | Admitting: Family Medicine

## 2022-07-17 ENCOUNTER — Ambulatory Visit (HOSPITAL_COMMUNITY): Payer: Medicare HMO

## 2022-07-18 ENCOUNTER — Ambulatory Visit (HOSPITAL_COMMUNITY)
Admission: RE | Admit: 2022-07-18 | Discharge: 2022-07-18 | Disposition: A | Discharge status: Home / Self Care | Payer: Medicare HMO | Source: Ambulatory Visit | Attending: Family Medicine | Admitting: Family Medicine

## 2022-07-18 DIAGNOSIS — Z1231 Encounter for screening mammogram for malignant neoplasm of breast: Secondary | ICD-10-CM | POA: Insufficient documentation

## 2022-07-26 ENCOUNTER — Other Ambulatory Visit: Payer: Self-pay

## 2022-07-26 ENCOUNTER — Ambulatory Visit (INDEPENDENT_AMBULATORY_CARE_PROVIDER_SITE_OTHER): Payer: Medicare HMO | Admitting: Nurse Practitioner

## 2022-07-26 ENCOUNTER — Encounter: Payer: Self-pay | Admitting: Nurse Practitioner

## 2022-07-26 VITALS — BP 122/71 | HR 66 | Ht 69.0 in | Wt 169.0 lb

## 2022-07-26 DIAGNOSIS — N3000 Acute cystitis without hematuria: Secondary | ICD-10-CM | POA: Insufficient documentation

## 2022-07-26 DIAGNOSIS — M545 Low back pain, unspecified: Secondary | ICD-10-CM

## 2022-07-26 LAB — POCT URINALYSIS DIP (CLINITEK)
Bilirubin, UA: NEGATIVE
Glucose, UA: NEGATIVE mg/dL
Ketones, POC UA: NEGATIVE mg/dL
Leukocytes, UA: NEGATIVE
Nitrite, UA: POSITIVE — AB
POC PROTEIN,UA: 30 — AB
Spec Grav, UA: >=1.03 — AB (ref 1.010–1.025)
Urobilinogen, UA: 0.2 E.U./dL
pH, UA: 5.5 (ref 5.0–8.0)

## 2022-07-26 MED ORDER — SULFAMETHOXAZOLE-TRIMETHOPRIM 800-160 MG PO TABS: 1.0000 | ORAL_TABLET | Freq: Two times a day (BID) | ORAL | 0 refills | Status: DC

## 2022-07-26 NOTE — Addendum Note (Signed)
Addended by: Jill Side on: 07/26/2022 10:36 AM   Modules accepted: Orders

## 2022-07-26 NOTE — Progress Notes (Signed)
   Sophia [name]     MRN: [id number]      DOB: 1950/12/06   HPI [name] with past medical history of essential hypertension, GERD, depression, prediabetes is here for c/o urinary hesitancy, Pain in her right lower abdomen, back pain since the past 4 days . she has been taking OTC UTI medication, urine is flowing better now, abdominal pain is better not as bad as it was. Denies bloody urine,dysuria vaginitis, fever, chills, nausea, vomiting.       ROS Denies recent fever or chills. Denies sinus pressure, nasal congestion, ear pain or sore throat. Denies chest congestion, productive cough or wheezing. Denies chest pains, palpitations and leg swelling Denies , nausea, vomiting,diarrhea or constipation.   Denies headaches, seizures, numbness, or tingling. Denies depression, anxiety or insomnia.    PE  BP 122/71 (BP Location: Left Arm, Patient Position: Sitting, Cuff Size: Normal)   Pulse 66   Ht '5\' 9"'$  (1.753 m)   Wt 169 lb (76.7 kg)   SpO2 96%   BMI 24.96 kg/m   Patient alert and oriented and in no cardiopulmonary distress.  Chest: Clear to auscultation bilaterally.  CVS: S1, S2 no murmurs, no S3.Regular rate.  ABD: Soft , right lower quadrant of the abdomen and right flank area tender on palpation. No masses palpated.   Ext: No edema  MS: Adequate ROM spine, shoulders, hips and knees.  Psych: Good eye contact, normal affect. Memory intact not anxious or depressed appearing.  CNS: CN 2-12 intact, power,  normal throughout.no focal deficits noted.   Assessment & Plan  Acute cystitis without hematuria UA shows positive nitrates negative leukocyte Start Bactrim DS 1 tablet twice daily for 7 days Drink at least 64 ounces of water daily Tylenol 650 mg every 6 hours as needed proper perineal hygiene discussed Follow-up in 4 days if symptoms does not improve Urine sent to the lab for culture

## 2022-07-26 NOTE — Patient Instructions (Signed)
Please take Septra DS 1 tablet 2 times daily for 7 days Take Tylenol 650 mg every 6 hours as needed for pain Drink at least 64 ounces of water daily to stay hydrated   Thanks for choosing [facility], we consider it a privelige to serve you.

## 2022-07-26 NOTE — Assessment & Plan Note (Signed)
UA shows positive nitrates negative leukocyte Start Bactrim DS 1 tablet twice daily for 7 days Drink at least 64 ounces of water daily Tylenol 650 mg every 6 hours as needed proper perineal hygiene discussed Follow-up in 4 days if symptoms does not improve Urine sent to the lab for culture

## 2022-07-29 LAB — URINE CULTURE

## 2022-08-11 ENCOUNTER — Emergency Department (HOSPITAL_COMMUNITY): Payer: Medicare HMO

## 2022-08-11 ENCOUNTER — Other Ambulatory Visit: Payer: Self-pay

## 2022-08-11 ENCOUNTER — Encounter (HOSPITAL_COMMUNITY): Payer: Self-pay

## 2022-08-11 ENCOUNTER — Emergency Department (HOSPITAL_COMMUNITY)
Admission: EM | Admit: 2022-08-11 | Discharge: 2022-08-11 | Disposition: A | Discharge status: Home / Self Care | Payer: Medicare HMO | Attending: Emergency Medicine | Admitting: Emergency Medicine

## 2022-08-11 DIAGNOSIS — Z7982 Long term (current) use of aspirin: Secondary | ICD-10-CM | POA: Diagnosis not present

## 2022-08-11 DIAGNOSIS — Z79899 Other long term (current) drug therapy: Secondary | ICD-10-CM | POA: Insufficient documentation

## 2022-08-11 DIAGNOSIS — E119 Type 2 diabetes mellitus without complications: Secondary | ICD-10-CM | POA: Diagnosis not present

## 2022-08-11 DIAGNOSIS — R109 Unspecified abdominal pain: Secondary | ICD-10-CM | POA: Diagnosis not present

## 2022-08-11 DIAGNOSIS — I10 Essential (primary) hypertension: Secondary | ICD-10-CM | POA: Insufficient documentation

## 2022-08-11 DIAGNOSIS — Z8616 Personal history of COVID-19: Secondary | ICD-10-CM | POA: Insufficient documentation

## 2022-08-11 DIAGNOSIS — I700 Atherosclerosis of aorta: Secondary | ICD-10-CM | POA: Diagnosis not present

## 2022-08-11 DIAGNOSIS — R1030 Lower abdominal pain, unspecified: Secondary | ICD-10-CM | POA: Insufficient documentation

## 2022-08-11 DIAGNOSIS — R1032 Left lower quadrant pain: Secondary | ICD-10-CM | POA: Diagnosis not present

## 2022-08-11 DIAGNOSIS — R8289 Other abnormal findings on cytological and histological examination of urine: Secondary | ICD-10-CM | POA: Insufficient documentation

## 2022-08-11 LAB — CBC
HCT: 35.4 % — ABNORMAL LOW (ref 36.0–46.0)
Hemoglobin: 11.4 g/dL — ABNORMAL LOW (ref 12.0–15.0)
MCH: 26.9 pg (ref 26.0–34.0)
MCHC: 32.2 g/dL (ref 30.0–36.0)
MCV: 83.5 fL (ref 80.0–100.0)
Platelets: 207 10*3/uL (ref 150–400)
RBC: 4.24 MIL/uL (ref 3.87–5.11)
RDW: 15.7 % — ABNORMAL HIGH (ref 11.5–15.5)
WBC: 4.8 10*3/uL (ref 4.0–10.5)
nRBC: 0 % (ref 0.0–0.2)

## 2022-08-11 LAB — COMPREHENSIVE METABOLIC PANEL
ALT: 24 U/L (ref 0–44)
AST: 21 U/L (ref 15–41)
Albumin: 4.4 g/dL (ref 3.5–5.0)
Alkaline Phosphatase: 64 U/L (ref 38–126)
Anion gap: 8 (ref 5–15)
BUN: 27 mg/dL — ABNORMAL HIGH (ref 8–23)
CO2: 28 mmol/L (ref 22–32)
Calcium: 9.7 mg/dL (ref 8.9–10.3)
Chloride: 104 mmol/L (ref 98–111)
Creatinine, Ser: 0.93 mg/dL (ref 0.44–1.00)
GFR, Estimated: >60 mL/min (ref >60)
Glucose, Bld: 103 mg/dL — ABNORMAL HIGH (ref 70–99)
Potassium: 3.6 mmol/L (ref 3.5–5.1)
Sodium: 140 mmol/L (ref 135–145)
Total Bilirubin: 0.5 mg/dL (ref 0.3–1.2)
Total Protein: 7.4 g/dL (ref 6.5–8.1)

## 2022-08-11 LAB — LIPASE, BLOOD: Lipase: 56 U/L — ABNORMAL HIGH (ref 11–51)

## 2022-08-11 LAB — URINALYSIS, ROUTINE W REFLEX MICROSCOPIC
Bacteria, UA: NONE SEEN
Bilirubin Urine: NEGATIVE
Glucose, UA: NEGATIVE mg/dL
Ketones, ur: NEGATIVE mg/dL
Leukocytes,Ua: NEGATIVE
Nitrite: POSITIVE — AB
Protein, ur: 30 mg/dL — AB
Specific Gravity, Urine: 1.023 (ref 1.005–1.030)
pH: 6 (ref 5.0–8.0)

## 2022-08-11 MED ORDER — OXYCODONE-ACETAMINOPHEN 5-325 MG PO TABS: 1.0000 | ORAL_TABLET | Freq: Three times a day (TID) | ORAL | 0 refills | Status: DC | PRN

## 2022-08-11 MED ORDER — KETOROLAC TROMETHAMINE 15 MG/ML IJ SOLN
7.5000 mg | Freq: Once | INTRAMUSCULAR | Status: AC
  Administered 2022-08-11: 7.5 mg via INTRAVENOUS
  Filled 2022-08-11: qty 1

## 2022-08-11 MED ORDER — HYDROMORPHONE HCL 1 MG/ML IJ SOLN
0.5000 mg | Freq: Once | INTRAMUSCULAR | Status: AC
  Administered 2022-08-11: 0.5 mg via INTRAVENOUS
  Filled 2022-08-11: qty 0.5

## 2022-08-11 MED ORDER — IOHEXOL 300 MG/ML  SOLN
100.0000 mL | Freq: Once | INTRAMUSCULAR | Status: AC | PRN
  Administered 2022-08-11: 100 mL via INTRAVENOUS

## 2022-08-11 NOTE — ED Provider Notes (Signed)
[facility] EMERGENCY DEPARTMENT Provider Note   CSN: [id number] Arrival date & time: 08/11/22  7209     History  Chief Complaint  Patient presents with   Abdominal Pain    Sophia [name] is a 71 y.o. adult.   Abdominal Pain Patient presents with lower abdominal pain.  Began a couple weeks ago with UTI.  Reportedly had dysuria and was given antibiotics.  States felt better for couple days but then worsened.  Now continued and worsening pain.  Lower abdomen.  States she feels as if she has to urinate but sometimes has difficulty doing it.  No fevers.  No diarrhea.  No constipation.  States it is painful.  States years ago she had to have something stretched in her bladder.  She has had previous hysterectomy.  Of note the documentation shows patient was assigned female at birth.  Patient states there was an error on her birth certificate that stated she was female but she has always been female.    Past Medical History:  Diagnosis Date   Acute respiratory disease due to COVID-19 virus 08/28/2019   Anxiety    Arthritis    Depression    Diabetes mellitus type 2 in obese ([place]) 11/28/2010   Qualifier: Diagnosis of  By: Moshe Cipro MD, Margaret  Diet controlled in 12/2016    Diabetes mellitus without complication ([place])    GERD (gastroesophageal reflux disease)    Hypercholesteremia    Hypertension    PONV (postoperative nausea and vomiting)    Sleep apnea    Past Surgical History:  Procedure Laterality Date   ABDOMINAL HYSTERECTOMY  11/18/1980   tubes and womb, bleeding and ectopic   ABDOMINAL SURGERY     removal of tumors   BALLOON DILATION N/A 02/16/2021   Procedure: BALLOON DILATION;  Surgeon: [name], DO;  [facility] ENDO [facility];  Service: Endoscopy;  Laterality: N/A;   BIOPSY  02/16/2021   Procedure: BIOPSY;  Surgeon: [name], DO;  [facility] END[facility];  Service: Endoscopy;;   BREAST SURGERY Left 11/18/1981   benign tumor   CATARACT EXTRACTION W/PHACO  Right 01/11/2014   Procedure: CATARACT EXTRACTION PHACO AND INTRAOCULAR LENS PLACEMENT (IOC);  Surgeon: Elta Guadeloupe T. Gershon Crane, MD;  Location: [facility] ORS;  Service: Ophthalmology;  Laterality: Right;  CDE 5.57   CATARACT EXTRACTION W/PHACO Left 01/25/2014   Procedure: CATARACT EXTRACTION PHACO AND INTRAOCULAR LENS PLACEMENT (IOC);  Surgeon: Elta Guadeloupe T. Gershon Crane, MD;  Location: [facility] ORS;  Service: Ophthalmology;  Laterality: Left;  CDE:5.19   COLONOSCOPY  2007 [facility]   NL EXAM   COLONOSCOPY N/A 04/22/2014   Dr. Fields:Normal mucosa in the terminal ileum/Two COLON polyps REMOVED/ Mild diverticulosis in the ascending colon and transverse colon/The LEFT colon IS redundant/Small internal hemorrhoids. Path: tubular adenoma. Next colonoscopy in 5-10 years   COLONOSCOPY WITH PROPOFOL N/A 11/27/2020   Surgeon: [name], DO;  Nonbleeding internal hemorrhoids, one 6 mm transverse colon polyp resected, diverticulosis in the sigmoid colon.  Pathology with tubular adenoma.  Repeat in 5 years.   ESOPHAGOGASTRODUODENOSCOPY N/A 04/22/2014   Dr. Fields:MICROCYTIC ANEMIA MOST LIKELY DUE TO ASA/VOLTAREN/Small hiatal hernia/MODERATE Non-erosive gastritis. Negative H.pylori   ESOPHAGOGASTRODUODENOSCOPY (EGD) WITH PROPOFOL N/A 02/16/2021   Surgeon: [name], DO; benign-appearing esophageal stenosis s/p dilation, gastritis biopsied.  Biopsies were benign.   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 Gershon Crane, MD;  Location: [facility] ORS;  Service: Ophthalmology;  Laterality: Right;   YAG LASER APPLICATION Left 99/35/7017   Procedure: YAG LASER APPLICATION;  Surgeon: Rutherford Guys, MD;  Location: [facility] ORS;  Service: Ophthalmology;  Laterality: Left;    Home Medications Prior to Admission medications   Medication Sig Start Date End Date Taking? Authorizing Provider  acetaminophen (TYLENOL) 650 MG CR tablet Take 650 mg by mouth 2 (two) times daily.   Yes [provider]  amLODipine (NORVASC) 10 MG tablet TAKE 1 TABLET EVERY DAY 03/05/22  Yes Fayrene Helper, MD  aspirin EC 81 MG tablet Take 81 mg by mouth daily. Swallow whole.   Yes [provider]  benazepril-hydrochlorthiazide (LOTENSIN HCT) 20-12.5 MG tablet TAKE 2 TABLETS EVERY DAY 06/19/22  Yes Fayrene Helper, MD  busPIRone (BUSPAR) 10 MG tablet Take 1 tablet (10 mg total) by mouth 3 (three) times daily. 06/04/22  Yes Fayrene Helper, MD  chlorthalidone (HYGROTON) 25 MG tablet TAKE 1 TABLET EVERY DAY 01/15/22  Yes Fayrene Helper, MD  Cyanocobalamin (VITAMIN B-12 PO) Take 2 gummies daily.   Yes [provider]  diltiazem (CARDIZEM CD) 180 MG 24 hr capsule Take 180 mg by mouth daily. 06/05/16  Yes [provider]  ferrous gluconate (FERGON) 324 MG tablet Take 324 mg by mouth daily with breakfast.   Yes [provider]  FLUoxetine (PROZAC) 40 MG capsule TAKE 1 CAPSULE EVERY DAY 03/05/22  Yes Fayrene Helper, MD  Multiple Vitamin (MULTIVITAMIN) tablet Take 1 tablet by mouth daily.   Yes [provider]  omeprazole (PRILOSEC) 40 MG capsule TAKE 1 CAPSULE EVERY DAY 03/05/22  Yes Fayrene Helper, MD  OVER THE COUNTER MEDICATION 300 mg 2 (two) times daily. Omega XL   Yes [provider]  oxyCODONE-acetaminophen (PERCOCET/ROXICET) 5-325 MG tablet Take 1-2 tablets by mouth every 8 (eight) hours as needed for severe pain. 08/11/22  Yes Davonna Belling, MD  polyethylene glycol (MIRALAX / GLYCOLAX) 17 g packet Take 17 g by mouth daily as needed.   Yes [provider]  rosuvastatin (CRESTOR) 20 MG tablet Take 1 tablet (20 mg total) by mouth daily. 06/04/22 12/01/22 Yes Fayrene Helper, MD  zinc gluconate 50 MG tablet Take 50 mg by mouth daily.   Yes [provider]  buPROPion (WELLBUTRIN XL) 150 MG 24 hr tablet TAKE 1 TABLET EVERY DAY Patient not taking: Reported on 08/11/2022 03/05/22   Fayrene Helper, MD  sulfamethoxazole-trimethoprim (BACTRIM DS) 800-160 MG tablet Take 1 tablet by mouth 2 (two) times daily. Patient not taking: Reported on 08/11/2022 07/26/22   Renee Rival, FNP      Allergies    Patient has no known allergies.    Review of Systems   Review of Systems  Gastrointestinal:  Positive for abdominal pain.    Physical Exam Updated Vital  Signs BP (!) 128/56   Pulse (!) 54   Temp 97.7 F (36.5 C) (Oral)   Resp 11   Ht '5\' 9"'$  (1.753 m)   Wt 76.7 kg   SpO2 100%   BMI 24.96 kg/m  Physical Exam Vitals and nursing note reviewed.  HENT:     Head: Atraumatic.  Cardiovascular:     Rate and Rhythm: Regular rhythm.  Pulmonary:     Breath sounds: No wheezing.  Abdominal:     Tenderness: There is abdominal tenderness.     Comments: Lower abdominal tenderness without rebound or guarding.  No hernias palpated.  Musculoskeletal:        General: No tenderness.     Cervical back: Neck supple.  Skin:    General: Skin is warm.     Capillary Refill: Capillary refill takes less than 2 seconds.  Neurological:     Mental Status: She is alert.     ED Results / Procedures / Treatments   Labs (all labs ordered are listed, but only abnormal results are displayed) Labs Reviewed  LIPASE, BLOOD - Abnormal; Notable for the following  components:      Result Value   Lipase 56 (*)    All other components within normal limits  COMPREHENSIVE METABOLIC PANEL - Abnormal; Notable for the following components:   Glucose, Bld 103 (*)    BUN 27 (*)    All other components within normal limits  CBC - Abnormal; Notable for the following components:   Hemoglobin 11.4 (*)    HCT 35.4 (*)    RDW 15.7 (*)    All other components within normal limits  URINALYSIS, ROUTINE W REFLEX MICROSCOPIC - Abnormal; Notable for the following components:   Color, Urine AMBER (*)    Hgb urine dipstick SMALL (*)    Protein, ur 30 (*)    Nitrite POSITIVE (*)    All other components within normal limits  URINE CULTURE    EKG None  Radiology CT ABDOMEN PELVIS W CONTRAST  Result Date: 08/11/2022 CLINICAL DATA:  71 year old female with LEFT abdominal and pelvic pain. EXAM: CT ABDOMEN AND PELVIS WITH CONTRAST TECHNIQUE: Multidetector CT imaging of the abdomen and pelvis was performed using the standard protocol following bolus administration of intravenous contrast. RADIATION DOSE REDUCTION: This exam was performed according to the departmental dose-optimization program which includes automated exposure control, adjustment of the mA and/or kV according to patient size and/or use of iterative reconstruction technique. CONTRAST:  149m OMNIPAQUE IOHEXOL 300 MG/ML  SOLN COMPARISON:  12/24/2020 and prior CTs FINDINGS: Lower chest: No acute abnormality. Hepatobiliary: CBD and intrahepatic biliary dilatation is not significantly changed from 2020 with the CBD measuring 14 mm in greatest diameter. The liver and gallbladder are otherwise unremarkable. Pancreas: Unremarkable Spleen: Unremarkable Adrenals/Urinary Tract: Fullness of both adrenal glands again noted. No significant renal abnormalities are identified. There is no evidence of hydronephrosis or obstructing urinary calculi. The bladder is unremarkable. Stomach/Bowel: Stomach is within normal limits. No  evidence of bowel wall thickening, distention, or inflammatory changes. Vascular/Lymphatic: Aortic atherosclerosis. No enlarged abdominal or pelvic lymph nodes. Reproductive: Status post hysterectomy. No adnexal masses. Other: No ascites, focal collection or pneumoperitoneum. No abdominal wall hernia is identified. Musculoskeletal: No acute or suspicious bony abnormalities are noted. IMPRESSION: 1. No evidence of acute abnormality. 2. Unchanged CBD and intrahepatic biliary dilatation from 2020. 3. Aortic Atherosclerosis (ICD10-I70.0). Electronically Signed   By: JMargarette CanadaM.D.   On: 08/11/2022 11:11  Procedures Procedures    Medications Ordered in ED Medications  iohexol (OMNIPAQUE) 300 MG/ML solution 100 mL (100 mLs Intravenous Contrast Given 08/11/22 1028)  HYDROmorphone (DILAUDID) injection 0.5 mg (0.5 mg Intravenous Given 08/11/22 1249)  ketorolac (TORADOL) 15 MG/ML injection 7.5 mg (7.5 mg Intravenous Given 08/11/22 1249)    ED Course/ Medical Decision Making/ A&P                           Medical Decision Making Amount and/or Complexity of Data Reviewed Labs: ordered. Radiology: ordered.  Risk Prescription drug management.   Patient with low abdominal pain.  Reviewed past surgical history and past medical history.  Has been treated recently for UTI with antibiotics now pain still in the lower abdomen but goes to the back.  Differential diagnosis includes UTI, diverticulitis, obstruction.  We will get basic blood work urinalysis and CT scan.  Urinalysis shows nitrite positive but no white cells or bacteria.  CT scan done and reassuring.  No clear cause of the pain.  Continued pain.  Felt better after some treatment however.  We will treat with pain medicine at home and outpatient follow-up.  Do not find a clear cause.  CT scan reassuring and GU cause felt less likely with previous hysterectomy and reassuring scan.  Discharge home with outpatient follow-up.        Final  Clinical Impression(s) / ED Diagnoses Final diagnoses:  Lower abdominal pain    Rx / DC Orders ED Discharge Orders          Ordered    oxyCODONE-acetaminophen (PERCOCET/ROXICET) 5-325 MG tablet  Every 8 hours PRN        08/11/22 1353              Davonna Belling, MD 08/11/22 1440

## 2022-08-11 NOTE — ED Triage Notes (Signed)
Pt c/o abd pain x 2 . Some nausea noted and previously had a UTI and finished ABT on the 15th.

## 2022-08-11 NOTE — Discharge Instructions (Addendum)
Your work-up was reassuring today.  No clear cause of the pain was found.  A urine culture was done and you will be notified if it does show an infection.

## 2022-08-12 LAB — URINE CULTURE: Culture: NO GROWTH

## 2022-08-13 ENCOUNTER — Encounter: Payer: Self-pay | Admitting: Nurse Practitioner

## 2022-08-13 ENCOUNTER — Ambulatory Visit (INDEPENDENT_AMBULATORY_CARE_PROVIDER_SITE_OTHER): Payer: Medicare HMO | Admitting: Nurse Practitioner

## 2022-08-13 DIAGNOSIS — Z0000 Encounter for general adult medical examination without abnormal findings: Secondary | ICD-10-CM

## 2022-08-13 NOTE — Progress Notes (Signed)
I connected with  Coolidge Breeze on 08/13/22 by a audio enabled telemedicine application and verified that I am speaking with the correct person using two identifiers.  Patient Location: Home  Provider Location: Office/Clinic  I discussed the limitations of evaluation and management by telemedicine. The patient expressed understanding and agreed to proceed.  Subjective:   Sophia [name] is a 71 y.o. female who presents for Medicare Annual (Subsequent) preventive examination.  Review of Systems           Objective:    There were no vitals filed for this visit. There is no height or weight on file to calculate BMI.     08/11/2022    8:45 AM 07/25/2021    2:18 PM 02/16/2021    9:09 AM 11/27/2020    6:39 AM 07/17/2020   11:24 AM 08/31/2019   12:00 AM 08/29/2019    2:00 AM  Advanced Directives  Does Patient Have a Medical Advance Directive? No No No No No No No  Would patient like information on creating a medical advance directive? No - Patient declined  Yes (MAU/Ambulatory/Procedural Areas - Information given) No - Patient declined Yes (ED - Information included in AVS)      Current Medications (verified) Outpatient Encounter Medications as of 08/13/2022  Medication Sig   acetaminophen (TYLENOL) 650 MG CR tablet Take 650 mg by mouth 2 (two) times daily.   amLODipine (NORVASC) 10 MG tablet TAKE 1 TABLET EVERY DAY   aspirin EC 81 MG tablet Take 81 mg by mouth daily. Swallow whole.   benazepril-hydrochlorthiazide (LOTENSIN HCT) 20-12.5 MG tablet TAKE 2 TABLETS EVERY DAY   buPROPion (WELLBUTRIN XL) 150 MG 24 hr tablet TAKE 1 TABLET EVERY DAY (Patient not taking: Reported on 08/11/2022)   busPIRone (BUSPAR) 10 MG tablet Take 1 tablet (10 mg total) by mouth 3 (three) times daily.   chlorthalidone (HYGROTON) 25 MG tablet TAKE 1 TABLET EVERY DAY   Cyanocobalamin (VITAMIN B-12 PO) Take 2 gummies daily.   diltiazem (CARDIZEM CD) 180 MG 24 hr capsule Take 180 mg by mouth daily.   ferrous  gluconate (FERGON) 324 MG tablet Take 324 mg by mouth daily with breakfast.   FLUoxetine (PROZAC) 40 MG capsule TAKE 1 CAPSULE EVERY DAY   Multiple Vitamin (MULTIVITAMIN) tablet Take 1 tablet by mouth daily.   omeprazole (PRILOSEC) 40 MG capsule TAKE 1 CAPSULE EVERY DAY   OVER THE COUNTER MEDICATION 300 mg 2 (two) times daily. Omega XL   oxyCODONE-acetaminophen (PERCOCET/ROXICET) 5-325 MG tablet Take 1-2 tablets by mouth every 8 (eight) hours as needed for severe pain.   polyethylene glycol (MIRALAX / GLYCOLAX) 17 g packet Take 17 g by mouth daily as needed.   rosuvastatin (CRESTOR) 20 MG tablet Take 1 tablet (20 mg total) by mouth daily.   sulfamethoxazole-trimethoprim (BACTRIM DS) 800-160 MG tablet Take 1 tablet by mouth 2 (two) times daily. (Patient not taking: Reported on 08/11/2022)   zinc gluconate 50 MG tablet Take 50 mg by mouth daily.   No facility-administered encounter medications on file as of 08/13/2022.    Allergies (verified) Patient has no known allergies.   History: Past Medical History:  Diagnosis Date   Acute respiratory disease due to COVID-19 virus 08/28/2019   Anxiety    Arthritis    Depression    Diabetes mellitus type 2 in obese ([place]) 11/28/2010   Qualifier: Diagnosis of  By: Moshe Cipro MD, Margaret  Diet controlled in 12/2016    Diabetes mellitus without  complication (HCC)    GERD (gastroesophageal reflux disease)    Hypercholesteremia    Hypertension    PONV (postoperative nausea and vomiting)    Sleep apnea    Past Surgical History:  Procedure Laterality Date   ABDOMINAL HYSTERECTOMY  11/18/1980   tubes and womb, bleeding and ectopic   ABDOMINAL SURGERY     removal of tumors   BALLOON DILATION N/A 02/16/2021   Procedure: BALLOON DILATION;  Surgeon: [name], DO;  [facility];  Service: Endoscopy;  Laterality: N/A;   BIOPSY  02/16/2021   Procedure: BIOPSY;  Surgeon: [name], DO;  [facility];  Service:  Endoscopy;;   BREAST SURGERY Left 11/18/1981   benign tumor   CATARACT EXTRACTION W/PHACO Right 01/11/2014   Procedure: CATARACT EXTRACTION PHACO AND INTRAOCULAR LENS PLACEMENT (IOC);  Surgeon: Elta Guadeloupe T. Gershon Crane, MD;  Location: [facility];  Service: Ophthalmology;  Laterality: Right;  CDE 5.57   CATARACT EXTRACTION W/PHACO Left 01/25/2014   Procedure: CATARACT EXTRACTION PHACO AND INTRAOCULAR LENS PLACEMENT (IOC);  Surgeon: Elta Guadeloupe T. Gershon Crane, MD;  Location: [facility];  Service: Ophthalmology;  Laterality: Left;  CDE:5.19   COLONOSCOPY  2007 [facility]   NL EXAM   COLONOSCOPY N/A 04/22/2014   Dr. Fields:Normal mucosa in the terminal ileum/Two COLON polyps REMOVED/ Mild diverticulosis in the ascending colon and transverse colon/The LEFT colon IS redundant/Small internal hemorrhoids. Path: tubular adenoma. Next colonoscopy in 5-10 years   COLONOSCOPY WITH PROPOFOL N/A 11/27/2020   Surgeon: [name], DO;  Nonbleeding internal hemorrhoids, one 6 mm transverse colon polyp resected, diverticulosis in the sigmoid colon.  Pathology with tubular adenoma.  Repeat in 5 years.   ESOPHAGOGASTRODUODENOSCOPY N/A 04/22/2014   Dr. Fields:MICROCYTIC ANEMIA MOST LIKELY DUE TO ASA/VOLTAREN/Small hiatal hernia/MODERATE Non-erosive gastritis. Negative H.pylori   ESOPHAGOGASTRODUODENOSCOPY (EGD) WITH PROPOFOL N/A 02/16/2021   Surgeon: [name], DO; benign-appearing esophageal stenosis s/p dilation, gastritis biopsied.  Biopsies were benign.   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 Gershon Crane, MD;  Location: [facility];  Service: Ophthalmology;  Laterality: Right;   YAG LASER APPLICATION Left 38/18/2993   Procedure: YAG LASER APPLICATION;  Surgeon: Rutherford Guys, MD;  Location: [facility];  Service: Ophthalmology;  Laterality: Left;   Family History  Problem Relation Age of Onset   Colon polyps Sister 66   Diabetes Sister    Hypertension Sister    Kidney disease Sister    Arthritis Father    Cancer Father        prostate    Hypertension Father    Diabetes Sister    Hypertension Sister    CAD Paternal Grandmother    Colon cancer Neg Hx    Social History   Socioeconomic History   Marital status:  Married    Spouse name: Not on file   Number of children: Not on file   Years of education: Not on file   Highest education level: Not on file  Occupational History   Not on file  Tobacco Use   Smoking status: Former    Packs/day: 0.25    Years: 20.00    Total pack years: 5.00    Types: Cigarettes    Quit date: 01/05/1990    Years since quitting: 32.6   Smokeless tobacco: Former    Types: Snuff    Quit date: 03/28/2022   Tobacco comments:    Client said she no longer is using snuff. She uses marijuana occasionally per client  Vaping Use   Vaping Use: Never used  Substance and Sexual Activity   Alcohol use: No   Drug use: Yes    Types: Marijuana    Comment: occ marijuana   Sexual activity: Not Currently    Birth control/protection: Surgical  Other Topics Concern   Not on file  Social History Narrative   Not on file   Social Determinants of Health   Financial Resource Strain: Low Risk  (07/25/2021)   Overall Financial Resource Strain (CARDIA)    Difficulty of Paying Living  Expenses: Not hard at all  Food Insecurity: No Food Insecurity (07/25/2021)   Hunger Vital Sign    Worried About Running Out of Food in the Last Year: Never true    [place] in the Last Year: Never true  Transportation Needs: No Transportation Needs (07/25/2021)   PRAPARE - Hydrologist (Medical): No    Lack of Transportation (Non-Medical): No  Physical Activity: Sufficiently Active (07/25/2021)   Exercise Vital Sign    Days of Exercise per Week: 7 days    Minutes of Exercise per Session: 30 min  Stress: Stress Concern Present (06/20/2022)   [place]    Feeling of Stress : To some extent  Social Connections: Moderately Integrated (07/25/2021)   Social Connection and Isolation Panel [NHANES]    Frequency of Communication with Friends and Family: More than three times a week    Frequency of Social Gatherings with Friends and Family: Twice a week    Attends Religious Services: More than 4 times per year    Active Member of [employer] or Organizations: No    Attends Archivist Meetings: Never    Marital Status: Married    Tobacco Counseling Counseling given: Not Answered Tobacco comments: Client said she no longer is using snuff. She uses marijuana occasionally per client   Clinical Intake:                 Diabetic?yes  Nutrition Risk Assessment:  Has the patient had any N/V/D within the last 2 months?  No  Does the patient have any non-healing wounds?  No  Has the patient had any unintentional weight loss or weight gain?  No   Diabetes:  Is the patient diabetic?  Yes  If diabetic, was a CBG obtained today?  No  Did the patient bring in their glucometer from home?  N/A  How often do you monitor your CBG's? Not currently   Financial Strains and Diabetes Management:  Are you having any financial strains with the device, your supplies or your medication? No .  Does  the patient want to be seen by Chronic Care Management for management of their diabetes?  No  Would the patient like to be referred to a Nutritionist or for Diabetic Management?  No  diabetes well controlled with diet and exercise   Diabetic Exams:    Up to date with diabetic eye exam and foot exam           Activities of Daily Living     No data to display          Patient Care Team: Fayrene Helper, MD as PCP - General (Family Medicine) Josue Hector, MD as PCP - Cardiology (Cardiology) Danie Binder, MD (Inactive) as Consulting Physician (Gastroenterology) Rutherford Guys, MD as Consulting Physician (Ophthalmology) Carole Civil, MD as Consulting Physician (Orthopedic Surgery) [name], DO as Consulting Physician (Internal Medicine)  Indicate any recent Medical Services you may have received from other than [facility] providers in the past year (date may be approximate).     Assessment:   This is a routine wellness examination for Sophia [name].  Hearing/Vision screen No results found.  Dietary issues and exercise activities discussed:     Goals Addressed   None    Depression Screen    07/26/2022   10:03 AM 06/20/2022    4:22 PM 06/04/2022   11:48 AM 05/16/2022    2:28 PM 05/16/2022    2:25 PM 04/11/2022   10:02 AM 04/04/2022    9:24 AM  PHQ 2/9 Scores  PHQ - 2 Score 0 2 0 2 2 0 0  PHQ- 9 Score 0 '6 2 6 6      '$ Fall Risk    07/26/2022   10:02 AM 06/04/2022   11:13 AM 04/04/2022    9:24 AM 01/31/2022   11:07 AM 11/09/2021   11:06 AM  Fall Risk   Falls in the past year? 0 0 0 0 0  Number falls in past yr: 0 0 0 0 0  Injury with Fall? 0 0 0 0 0  Risk for fall due to : No Fall Risks  No Fall Risks No Fall Risks   Follow up Falls evaluation completed  Falls evaluation completed Falls evaluation completed     FALL RISK PREVENTION PERTAINING TO THE HOME:  Any stairs in or around the home? Yes  If so, are there any without handrails? No  Home free of  loose throw rugs in walkways, pet beds, electrical cords, etc? Yes  Adequate lighting in your home to reduce risk of falls? Yes   ASSISTIVE DEVICES UTILIZED TO PREVENT FALLS:  Life alert? No  Use of a cane, walker or w/c? No  Grab bars in the bathroom? Yes  Shower chair or bench in shower? Yes  Elevated toilet seat or a handicapped toilet? Yes   TIMED UP AND GO:          07/25/2021    2:22 PM 07/17/2020   11:26 AM 07/14/2019    3:00 PM 07/06/2018    2:19 PM  6CIT Screen  What Year? 0 points 0 points 0 points 0 points  What month? 0 points 0 points 0 points 0 points  What time? 0 points  0 points 0 points  Count back from 20 0 points 4 points 0 points 0 points  Months in reverse 0 points  4 points 4 points  Repeat phrase 10 points  2 points 2 points  Total Score 10 points  6 points 6 points    Immunizations Immunization History  Administered Date(s) Administered   Fluad Quad(high Dose 65+) 08/16/2019,  08/04/2020, 07/17/2021   Influenza Split 08/19/2014   Influenza Whole 09/20/1996   Influenza,inj,Quad PF,6+ Mos 09/15/2015, 08/06/2016, 07/06/2018   Moderna SARS-COV2 Booster Vaccination 06/27/2021, 03/29/2022   Moderna Sars-Covid-2 Vaccination 01/12/2020, 02/09/2020, 09/18/2020   Pneumococcal Conjugate-13 01/25/2015   Pneumococcal Polysaccharide-23 11/28/2010, 03/25/2016   Tdap 04/29/2014   Zoster Recombinat (Shingrix) 03/29/2022, 06/03/2022   Zoster, Live 11/28/2010    TDAP status: Up to date  Flu Vaccine status: Up to date  Pneumococcal vaccine status: Up to date  Covid-19 vaccine status: Completed vaccines  Qualifies for Shingles Vaccine?   Shingrix Completed?: Yes  Screening Tests Health Maintenance  Topic Date Due   Diabetic kidney evaluation - Urine ACR  06/08/2021   INFLUENZA VACCINE  06/18/2022   COVID-19 Vaccine (4 - Moderna series) 08/17/2022 (Originally 05/24/2022)   FOOT EXAM  11/09/2022   HEMOGLOBIN A1C  12/01/2022   OPHTHALMOLOGY EXAM   03/26/2023   Diabetic kidney evaluation - GFR measurement  08/12/2023   TETANUS/TDAP  04/29/2024   MAMMOGRAM  07/18/2024   COLONOSCOPY (Pts 45-67yr Insurance coverage will need to be confirmed)  11/27/2025   Pneumonia Vaccine 72 Years old  Completed   DEXA SCAN  Completed   Hepatitis C Screening  Completed   Zoster Vaccines- Shingrix  Completed   HPV VACCINES  Aged Out    Health Maintenance  Health Maintenance Due  Topic Date Due   Diabetic kidney evaluation - Urine ACR  06/08/2021   INFLUENZA VACCINE  06/18/2022   Up to date with colonoscopy, mammogram, DEXA scan     Lung Cancer Screening: (Low Dose CT Chest recommended if Age 10526-80years, 30 pack-year currently smoking OR have quit w/in 15years.) does not qualify.   Lung Cancer Screening Referral: N/A  Additional Screening:  Hepatitis C Screening:completed   Vision Screening: Recommended annual ophthalmology exams for early detection of glaucoma and other disorders of the eye. Is the patient up to date with their annual eye exam?  Yes  Who is the provider or what is the name of the office in which the patient attends annual eye exams?  If pt is not established with a provider, would they like to be referred to a provider to establish care?   Dental Screening: Recommended annual dental exams for proper oral hygiene  Community Resource Referral / Chronic Care Management:     Plan:     I have personally reviewed and noted the following in the patient's chart:   Medical and social history Use of alcohol, tobacco or illicit drugs  Current medications and supplements including opioid prescriptions. Patient is currently taking opioid prescriptions. Information provided to patient regarding non-opioid alternatives. Patient advised to discuss non-opioid treatment plan with their provider. Functional ability and status Nutritional status Physical activity Advanced directives List of other physicians Hospitalizations,  surgeries, and ER visits in previous 12 months Vitals Screenings to include cognitive, depression, and falls Referrals and appointments  In addition, I have reviewed and discussed with patient certain preventive protocols, quality metrics, and best practice recommendations. A written personalized care plan for preventive services as well as general preventive health recommendations were provided to patient.     FRenee Rival FNP   08/13/2022   Nurse Notes:   Advanced directives forms mailed today   Has a question about her gender identity, states that she was told by the doctor at the ED that she was a female at birth, she would like to go to the birth registery  and investigate  about this , wants to know if she had a twin that was a female. She does not want me to correct the information in her chart today.

## 2022-08-13 NOTE — Patient Instructions (Signed)
  Sophia [name] , Thank you for taking time to come for your Medicare Wellness Visit. I appreciate your ongoing commitment to your health goals. Please review the following plan we discussed and let me know if I can assist you in the future.   These are the goals we discussed: Please complete the advanced care directives forms as dicussed    Goals       Anxiety Symptoms Monitored and Managed. Manage anxiety issues. Manage depression issues (pt-stated)      Time Frame:  Short Term Goal Priority : Medium Progress: On Track  Start Date: 04/11/22 End Date:  08/12/22    Follow Up Date:  LCSW is discharging client today from [place].    Anxiety symptoms Monitored and managed. Manage anxiety issues. Manage depression issues  Patient Coping Activities:  Enjoys listening to music Enjoys playing games on tablet Enjoys doing activities by herself  Patient Coping Strengths:  Takes medications as prescribed Has no transportation issues Attends scheduled medical appointments  Patient Deficits:  Anxiety issues Depression issues Pain issues   Patient Goals:  - spend time or talk with others at least 2 to 3 times per week - practice relaxation or meditation daily - keep a calendar with appointment dates  Follow Up Plan: LCSW is discharging client today from [place]. Client agreed to this plan .       Blood Pressure < 140/90      DIET - REDUCE PORTION SIZE      Increase physical activity      LIFESTYLE - DECREASE FALLS RISK        This is a list of the screening recommended for you and due dates:  Health Maintenance  Topic Date Due   Yearly kidney health urinalysis for diabetes  06/08/2021   COVID-19 Vaccine (4 - Moderna series) 08/17/2022*   Complete foot exam   11/09/2022   Hemoglobin A1C  12/01/2022   Eye exam for diabetics  03/26/2023   Yearly kidney function blood test for diabetes  08/12/2023   Tetanus Vaccine  04/29/2024   Mammogram  07/18/2024   Colon  Cancer Screening  11/27/2025   Pneumonia Vaccine  Completed   Flu Shot  Completed   DEXA scan (bone density measurement)  Completed   Hepatitis C Screening: USPSTF Recommendation to screen - Ages 67-79 yo.  Completed   Zoster (Shingles) Vaccine  Completed   HPV Vaccine  Aged Out  *Topic was postponed. The date shown is not the original due date.

## 2022-08-16 NOTE — Addendum Note (Signed)
Addended by: Eual Fines on: 08/16/2022 01:53 PM   Modules accepted: Orders, Level of Service

## 2022-09-28 ENCOUNTER — Other Ambulatory Visit: Payer: Self-pay | Admitting: Family Medicine

## 2022-10-15 ENCOUNTER — Ambulatory Visit (INDEPENDENT_AMBULATORY_CARE_PROVIDER_SITE_OTHER): Payer: Medicare HMO | Admitting: Family Medicine

## 2022-10-15 ENCOUNTER — Encounter: Payer: Self-pay | Admitting: Family Medicine

## 2022-10-15 VITALS — BP 120/72 | Ht 69.0 in | Wt 169.0 lb

## 2022-10-15 DIAGNOSIS — R1319 Other dysphagia: Secondary | ICD-10-CM | POA: Diagnosis not present

## 2022-10-15 DIAGNOSIS — I10 Essential (primary) hypertension: Secondary | ICD-10-CM | POA: Diagnosis not present

## 2022-10-15 DIAGNOSIS — R1011 Right upper quadrant pain: Secondary | ICD-10-CM

## 2022-10-15 DIAGNOSIS — R1013 Epigastric pain: Secondary | ICD-10-CM

## 2022-10-15 DIAGNOSIS — K839 Disease of biliary tract, unspecified: Secondary | ICD-10-CM

## 2022-10-15 DIAGNOSIS — K2100 Gastro-esophageal reflux disease with esophagitis, without bleeding: Secondary | ICD-10-CM

## 2022-10-15 MED ORDER — SUCRALFATE 1 GM/10ML PO SUSP: 1.0000 g | Freq: Two times a day (BID) | ORAL | 1 refills | Status: DC

## 2022-10-15 NOTE — Patient Instructions (Signed)
Follow-up as before, call if you need me sooner.  H. pylori Cmp and EGFR today.  You referred to GI for uncontrolled reflux symptoms and difficulty swallowing solid foods that office will call you.  Increase omeprazole to 1 capsule twice daily.for 2 months   To help with heartburn symptoms is Carafate liquid 3 times daily.  Please stop all caffeine products and any nicotine to reduce your uncontrolled reflux symptoms.  You are referred for an ultrasound of your gallbladder we will call with appointment information.  Thanks for choosing [facility], we consider it a privelige to serve you.

## 2022-10-16 ENCOUNTER — Encounter: Payer: Self-pay | Admitting: Family Medicine

## 2022-10-16 ENCOUNTER — Encounter: Payer: Self-pay | Admitting: Internal Medicine

## 2022-10-16 DIAGNOSIS — R1011 Right upper quadrant pain: Secondary | ICD-10-CM | POA: Insufficient documentation

## 2022-10-16 NOTE — Assessment & Plan Note (Signed)
con

## 2022-10-16 NOTE — Assessment & Plan Note (Signed)
Currently uncontrolled, check H pylori, double dose of PPI and add carafate

## 2022-10-16 NOTE — Assessment & Plan Note (Signed)
Pain and bloating, needs [place] to evaluate bile ducts and gall bladder, also refer GI

## 2022-10-16 NOTE — Assessment & Plan Note (Signed)
Reports solid dysphagia in past several weeks, refer GI, has had dilation in the past

## 2022-10-16 NOTE — Progress Notes (Signed)
   Sophia [name]     MRN: [id number]      DOB: May 27, 1951   HPI Ms. Purdie is here with a 6 day h/o epigastric and substernal pain, and also c/o constipation for past 6 days , also c/o difficulty swallowing solid food Also c/o bloating and RUQ pain with nausea No fevr or chills , no urinary symptoms  ROS Denies recent fever or chills. Denies sinus pressure, nasal congestion, ear pain or sore throat. Denies chest congestion, productive cough or wheezing. Denies chest pains, palpitations and leg swelling Denies dysuria, frequency, hesitancy or incontinence. Denies uncontrolled  joint pain, swelling and limitation in mobility. Denies headaches, seizures, numbness, or tingling. Denies skin break down or rash.   PE  BP 120/72 (BP Location: Right Arm, Patient Position: Sitting, Cuff Size: Normal)   Patient alert and oriented and in no cardiopulmonary distress.  HEENT: No facial asymmetry, EOMI,     Neck supple .  Chest: Clear to auscultation bilaterally.  CVS: S1, S2 no murmurs, no S3.Regular rate.  ABD: Soft mild epigastric and RUQ tenderness, no guardinf or rebound, no palpable organomegaly or mass, normal bS.   Ext: No edema  MS: Adequate ROM spine, shoulders, hips and knees.  Skin: Intact, no ulcerations or rash noted.  Psych: Good eye contact, normal affect. Memory intact mildly  anxious not  depressed appearing.  CNS: CN 2-12 intact, power,  normal throughout.no focal deficits noted.   Assessment & Plan  RUQ pain Pain and bloating, needs [place] to evaluate bile ducts and gall bladder, also refer GI  Esophageal dysphagia Reports solid dysphagia in past several weeks, refer GI, has had dilation in the past  Essential hypertension .con  Gastroesophageal reflux disease Currently uncontrolled, check H pylori, double dose of PPI and add carafate

## 2022-10-21 ENCOUNTER — Other Ambulatory Visit: Payer: Self-pay

## 2022-10-21 DIAGNOSIS — R1013 Epigastric pain: Secondary | ICD-10-CM

## 2022-10-24 LAB — H. PYLORI ANTIGEN, STOOL: H pylori Ag, Stl: NEGATIVE

## 2022-10-25 ENCOUNTER — Ambulatory Visit (HOSPITAL_COMMUNITY)
Admission: RE | Admit: 2022-10-25 | Discharge: 2022-10-25 | Disposition: A | Discharge status: Home / Self Care | Payer: Medicare HMO | Source: Ambulatory Visit | Attending: Family Medicine | Admitting: Family Medicine

## 2022-10-25 DIAGNOSIS — R1011 Right upper quadrant pain: Secondary | ICD-10-CM | POA: Insufficient documentation

## 2022-10-25 DIAGNOSIS — R140 Abdominal distension (gaseous): Secondary | ICD-10-CM | POA: Diagnosis not present

## 2022-10-25 DIAGNOSIS — K838 Other specified diseases of biliary tract: Secondary | ICD-10-CM | POA: Diagnosis not present

## 2022-10-25 DIAGNOSIS — K760 Fatty (change of) liver, not elsewhere classified: Secondary | ICD-10-CM | POA: Diagnosis not present

## 2022-11-14 ENCOUNTER — Ambulatory Visit (INDEPENDENT_AMBULATORY_CARE_PROVIDER_SITE_OTHER): Payer: Medicare HMO | Admitting: Family Medicine

## 2022-11-14 ENCOUNTER — Ambulatory Visit (HOSPITAL_COMMUNITY)
Admission: RE | Admit: 2022-11-14 | Discharge: 2022-11-14 | Disposition: A | Discharge status: Home / Self Care | Payer: Medicare HMO | Source: Ambulatory Visit | Attending: Family Medicine | Admitting: Family Medicine

## 2022-11-14 ENCOUNTER — Encounter: Payer: Self-pay | Admitting: Family Medicine

## 2022-11-14 VITALS — BP 112/77 | HR 86 | Ht 69.0 in | Wt 171.1 lb

## 2022-11-14 DIAGNOSIS — E785 Hyperlipidemia, unspecified: Secondary | ICD-10-CM | POA: Diagnosis not present

## 2022-11-14 DIAGNOSIS — I10 Essential (primary) hypertension: Secondary | ICD-10-CM | POA: Diagnosis not present

## 2022-11-14 DIAGNOSIS — Z0001 Encounter for general adult medical examination with abnormal findings: Secondary | ICD-10-CM | POA: Diagnosis not present

## 2022-11-14 DIAGNOSIS — E559 Vitamin D deficiency, unspecified: Secondary | ICD-10-CM | POA: Diagnosis not present

## 2022-11-14 DIAGNOSIS — R0602 Shortness of breath: Secondary | ICD-10-CM

## 2022-11-14 DIAGNOSIS — R7303 Prediabetes: Secondary | ICD-10-CM

## 2022-11-14 DIAGNOSIS — Z0000 Encounter for general adult medical examination without abnormal findings: Secondary | ICD-10-CM

## 2022-11-14 DIAGNOSIS — T733XXA Exhaustion due to excessive exertion, initial encounter: Secondary | ICD-10-CM | POA: Diagnosis not present

## 2022-11-14 NOTE — Patient Instructions (Addendum)
F/u in 4 months, call if you need me sooner   CXR today due to fatigue and poor exercise tolerance, just go to the Radiology dept , the order is in  [place] are referred to Cardiology in [place], their office will call with appt  Keep appt with GI doctor to manage your reflux and heartburn, thanklful you are doing better  It is important that you exercise regularly at least 30 minutes 5 times a week. If you develop chest pain, have severe difficulty breathing, or feel very tired, stop exercising immediately and seek medical attention    Best for 2024!  Fasting cBC, lipid, cmp and egFR , hBA1C , tSH and vit d 3 to 5 days before next appt in April or May  Thanks for choosing [facility], we consider it a privelige to serve you.

## 2022-11-14 NOTE — Progress Notes (Signed)
    Sophia [name]     MRN: [id number]      DOB: 12/06/1950  HPI: Patient is in for annual physical exam. Increased fatigue and reduced exercise tolerance noted since August 18, 2022 Recent labs,  are reviewed. Immunization is reviewed , and  updated if needed.   PE: BP 112/77 (BP Location: Right Arm, Patient Position: Sitting, Cuff Size: Large)   Pulse 86   Ht '5\' 9"'$  (1.753 m)   Wt 171 lb 1.3 oz (77.6 kg)   SpO2 95%   BMI 25.26 kg/m   Pleasant  female, alert and oriented x 3, in no cardio-pulmonary distress. Afebrile. HEENT No facial trauma or asymetry. Sinuses non tender.  Extra occullar muscles intact.. External ears normal, . Neck: supple, no adenopathy,JVD or thyromegaly.No bruits.  Chest:clear , no crackles or wheezes, good air entry bilaterally.  Cardiovascular system; Heart sounds normal,  S1 and  S2 ,no S3.  No murmur, or thrill. Apical beat not displaced Peripheral pulses normal.  Abdomen: Soft, non tender, no organomegaly or masses. No bruits. Bowel sounds normal. No guarding, tenderness or rebound.    Musculoskeletal exam: Full ROM of spine, hips , shoulders and knees. No deformity ,swelling or crepitus noted. No muscle wasting or atrophy.   Neurologic: Cranial nerves 2 to 12 intact. Power, tone ,sensation and reflexes normal throughout. No disturbance in gait. No tremor.  Skin: Intact, no ulceration, erythema , scaling or rash noted. Pigmentation normal throughout  Psych; Normal mood and affect. Judgement and concentration normal   Assessment & Plan:  Encounter for annual physical exam Annual exam as documented. Counseling done  re healthy lifestyle involving commitment to 150 minutes exercise per week, heart healthy diet, and attaining healthy weight.The importance of adequate sleep also discussed. Regular seat belt use and home safety, is also discussed. Changes in health habits are decided on by the patient with goals and time frames  set  for achieving them. Immunization and cancer screening needs are specifically addressed at this visit.   Shortness of breath 3 month history noted to be progressive, CXr and refer to Cardiology, as reports poor exercise tolerance since 10/01 and has multiple cardiac risk factors and long h/o HTN  Fatigue Increased exertional fatigue since 10/01, refer to Cardiology

## 2022-11-15 ENCOUNTER — Encounter: Payer: Self-pay | Admitting: Family Medicine

## 2022-11-15 ENCOUNTER — Encounter: Payer: Self-pay | Admitting: Internal Medicine

## 2022-11-15 ENCOUNTER — Ambulatory Visit: Payer: Medicare HMO | Attending: Internal Medicine | Admitting: Internal Medicine

## 2022-11-15 VITALS — BP 116/64 | HR 68 | Ht 69.0 in | Wt 171.2 lb

## 2022-11-15 DIAGNOSIS — I10 Essential (primary) hypertension: Secondary | ICD-10-CM | POA: Diagnosis not present

## 2022-11-15 DIAGNOSIS — R0609 Other forms of dyspnea: Secondary | ICD-10-CM | POA: Insufficient documentation

## 2022-11-15 NOTE — Assessment & Plan Note (Signed)
3 month history noted to be progressive, CXr and refer to Cardiology, as reports poor exercise tolerance since 10/01 and has multiple cardiac risk factors and long h/o HTN

## 2022-11-15 NOTE — Assessment & Plan Note (Signed)

## 2022-11-15 NOTE — Progress Notes (Signed)
Cardiology Office Note  Date: 11/15/2022   ID: Sophia [name], DOB 20-Sep-1951, MRN [id number]  PCP:  Fayrene Helper, MD  Cardiologist:  Jenkins Rouge, MD Electrophysiologist:  None   Reason for Office Visit: SOB and palpitation evaluation at the request of Dr. Moshe Cipro   History of Present Illness: Sophia [name] is a 71 y.o. adult known to have HTN, DM2, HLD was referred to cardiology clinic for evaluation of SOB and palpitations.  Patient reported that she was diagnosed with COVID infection in 2020 and has severe shortness of breath at that time. She continues to have improvement in her symptoms of SOB since then and currently her SOB is not as bad as before, in 2020.  She had a chest x-ray recently and was told it was normal. She does biking at home, 3 times per week and has no symptoms of SOB or chest pain. She did have palpitations lasting for a few seconds while she was in the church and never recurred. Denied having any dizziness, lightheadedness, syncope, LE swelling.  Past Medical History:  Diagnosis Date   Acute respiratory disease due to COVID-19 virus 08/28/2019   Anxiety    Arthritis    Depression    Diabetes mellitus type 2 in obese ([place]) 11/28/2010   Qualifier: Diagnosis of  By: Moshe Cipro MD, Margaret  Diet controlled in 12/2016    Diabetes mellitus without complication ([place])    GERD (gastroesophageal reflux disease)    Hypercholesteremia    Hypertension    PONV (postoperative nausea and vomiting)    Sleep apnea     Past Surgical History:  Procedure Laterality Date   ABDOMINAL HYSTERECTOMY  11/18/1980   tubes and womb, bleeding and ectopic   ABDOMINAL SURGERY     removal of tumors   BALLOON DILATION N/A 02/16/2021   Procedure: BALLOON DILATION;  Surgeon: [name], DO;  [facility];  Service: Endoscopy;  Laterality: N/A;   BIOPSY  02/16/2021   Procedure: BIOPSY;  Surgeon: [name], DO;  [facility];  Service:  Endoscopy;;   BREAST SURGERY Left 11/18/1981   benign tumor   CATARACT EXTRACTION W/PHACO Right 01/11/2014   Procedure: CATARACT EXTRACTION PHACO AND INTRAOCULAR LENS PLACEMENT (IOC);  Surgeon: Elta Guadeloupe T. Gershon Crane, MD;  Location: [facility];  Service: Ophthalmology;  Laterality: Right;  CDE 5.57   CATARACT EXTRACTION W/PHACO Left 01/25/2014   Procedure: CATARACT EXTRACTION PHACO AND INTRAOCULAR LENS PLACEMENT (IOC);  Surgeon: Elta Guadeloupe T. Gershon Crane, MD;  Location: [facility];  Service: Ophthalmology;  Laterality: Left;  CDE:5.19   COLONOSCOPY  2007 [facility]   NL EXAM   COLONOSCOPY N/A 04/22/2014   Dr. Fields:Normal mucosa in the terminal ileum/Two COLON polyps REMOVED/ Mild diverticulosis in the ascending colon and transverse colon/The LEFT colon IS redundant/Small internal hemorrhoids. Path: tubular adenoma. Next colonoscopy in 5-10 years   COLONOSCOPY WITH PROPOFOL N/A 11/27/2020   Surgeon: [name], DO;  Nonbleeding internal hemorrhoids, one 6 mm transverse colon polyp resected, diverticulosis in the sigmoid colon.  Pathology with tubular adenoma.  Repeat in 5 years.   ESOPHAGOGASTRODUODENOSCOPY N/A 04/22/2014   Dr. Fields:MICROCYTIC ANEMIA MOST LIKELY DUE TO ASA/VOLTAREN/Small hiatal hernia/MODERATE Non-erosive gastritis. Negative H.pylori   ESOPHAGOGASTRODUODENOSCOPY (EGD) WITH PROPOFOL N/A 02/16/2021   Surgeon: [name], DO; benign-appearing esophageal stenosis s/p dilation, gastritis biopsied.  Biopsies were benign.   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 Gershon Crane, MD;  Location: [facility];  Service: Ophthalmology;  Laterality: Right;   YAG LASER APPLICATION Left 35/70/1779   Procedure: YAG LASER APPLICATION;  Surgeon: Rutherford Guys, MD;  Location: [facility];  Service: Ophthalmology;  Laterality: Left;    Current Outpatient Medications  Medication Sig Dispense Refill   acetaminophen (TYLENOL) 650 MG CR tablet Take 650 mg by mouth 2 (two) times daily.     amLODipine (NORVASC) 10 MG tablet TAKE 1 TABLET EVERY DAY 90 tablet 10   aspirin EC 81 MG tablet Take 81 mg by mouth daily. Swallow whole.     benazepril-hydrochlorthiazide (LOTENSIN HCT) 20-12.5 MG tablet TAKE 2 TABLETS EVERY DAY 180 tablet 0   buPROPion (WELLBUTRIN XL) 150 MG 24 hr tablet TAKE 1 TABLET EVERY DAY 90 tablet 3   busPIRone (BUSPAR) 10 MG tablet Take 1 tablet (10 mg total) by mouth 3 (three) times daily. 270 tablet 3   chlorthalidone (HYGROTON) 25 MG tablet TAKE 1 TABLET EVERY DAY 90 tablet 3   Cyanocobalamin (VITAMIN B-12 PO) Take 2 gummies daily.     diltiazem (CARDIZEM CD) 180 MG 24 hr capsule Take 180 mg by mouth daily.     ferrous gluconate (FERGON) 324 MG tablet Take 324 mg by mouth daily with breakfast.     FLUoxetine (PROZAC) 40 MG capsule TAKE 1 CAPSULE EVERY DAY 90 capsule 10   Multiple Vitamin (MULTIVITAMIN) tablet Take 1 tablet by mouth daily.     omeprazole (PRILOSEC) 40 MG capsule Take one capsule by mouth two times daily for 2 month only 30 capsule 3   OVER THE COUNTER MEDICATION 300 mg 2 (two) times daily. Omega XL     polyethylene glycol (MIRALAX / GLYCOLAX) 17 g packet Take 17 g by mouth daily as needed.     rosuvastatin (CRESTOR) 20 MG tablet Take 1 tablet (20 mg total) by mouth  daily. 90 tablet 1   sucralfate (CARAFATE) 1 GM/10ML suspension Take 10 mLs (1 g total) by mouth 2 (two) times daily. 420 mL 1   zinc gluconate 50 MG tablet Take 50 mg by mouth daily.     No current facility-administered medications for this visit.   Allergies:  Patient has no known allergies.   Social History: The patient  reports that she quit smoking about 32 years ago. Her smoking use included cigarettes. She has a 5.00 pack-year smoking history. She quit smokeless tobacco use about 7 months ago.  Her smokeless tobacco use included snuff. She reports current drug use. Drug: Marijuana. She reports that she does not drink alcohol.   Family History: The patient's family history includes Arthritis in her father; CAD in her paternal grandmother; Cancer in her father; Colon polyps (age of onset: 55) in her sister; Diabetes in her sister and sister; Hypertension in her father, sister, and sister; Kidney  disease in her sister.   ROS:  Please see the history of present illness. Otherwise, complete review of systems is positive for none.  All other systems are reviewed and negative.   Physical Exam: VS:  BP 116/64   Pulse 68   Ht '5\' 9"'$  (1.753 m)   Wt 171 lb 3.2 oz (77.7 kg)   SpO2 94%   BMI 25.28 kg/m , BMI Body mass index is 25.28 kg/m.  Wt Readings from Last 3 Encounters:  11/15/22 171 lb 3.2 oz (77.7 kg)  11/14/22 171 lb 1.3 oz (77.6 kg)  10/16/22 169 lb (76.7 kg)    General: Patient appears comfortable at rest. HEENT: Conjunctiva and lids normal, oropharynx clear with moist mucosa. Neck: Supple, no elevated JVP or carotid bruits, no thyromegaly. Lungs: Clear to auscultation, nonlabored breathing at rest. Cardiac: Regular rate and rhythm, no S3 or significant systolic murmur, no pericardial rub. Abdomen: Soft, nontender, no hepatomegaly, bowel sounds present, no guarding or rebound. Extremities: No pitting edema, distal pulses 2+. Skin: Warm and dry. Musculoskeletal: No  kyphosis. Neuropsychiatric: Alert and oriented x3, affect grossly appropriate.  ECG:  An ECG dated 11/15/22 was personally reviewed today and demonstrated:  Normal sinus rhythm  Recent Labwork: 01/24/2022: TSH 1.740 08/11/2022: ALT 24; AST 21; BUN 27; Creatinine, Ser 0.93; Hemoglobin 11.4; Platelets 207; Potassium 3.6; Sodium 140     Component Value Date/Time   CHOL 133 05/31/2022 1009   TRIG 113 05/31/2022 1009   HDL 58 05/31/2022 1009   CHOLHDL 2.3 05/31/2022 1009   CHOLHDL 2.8 09/01/2020 0941   VLDL 53 (H) 06/23/2018 0352   LDLCALC 55 05/31/2022 1009   [place] 97 09/01/2020 0941    Other Studies Reviewed Today:   Assessment and Plan: Patient is a 71 year old F known to have HTN, DM 2, HLD was referred to cardiology clinic for evaluation of SOB and palpitations.  # DOE -Patient had improvement in her SOB symptoms since COVID infection in 2020 and currently exercises (biking) 3 times per week with no symptoms of SOB. I do not think this SOB is cardiac in etiology. LHC in 2019 showed angiographically normal coronaries. Echo from 2022 showed normal LVEF and no valve abnormalities.  # Palpitations -Palpitations lasted for a few seconds while she was in the church and never recurred.  She never had any palpitations in the past. No indication of further cardiac testing with event monitor at this time. In the future, if her palpitations become more frequent, she will benefit from event monitor.  # HTN, controlled -Management per PCP  I have spent a total of 33 minutes with patient reviewing chart, EKGs, labs and examining patient as well as establishing an assessment and plan that was discussed with the patient.  > 50% of time was spent in direct patient care.     Medication Adjustments/Labs and Tests Ordered: Current medicines are reviewed at length with the patient today.  Concerns regarding medicines are outlined above.   Tests Ordered: No orders of the defined types were placed  in this encounter.   Medication Changes: No orders of the defined types were placed in this encounter.   Disposition:  Follow up prn  Signed, Maryjane Benedict Fidel Levy, MD, 11/15/2022 2:26 PM    [facility] at [facility] [address], [place], [place] [address]

## 2022-11-15 NOTE — Assessment & Plan Note (Signed)
Increased exertional fatigue since 10/01, refer to Cardiology

## 2022-11-15 NOTE — Patient Instructions (Signed)
Medication Instructions:  Your physician recommends that you continue on your current medications as directed. Please refer to the Current Medication list given to you today.   *If you need a refill on your cardiac medications before your next appointment, please call your pharmacy*   Lab Work: NONE ordered at this time of appointment   If you have labs (blood work) drawn today and your tests are completely normal, you will receive your results only by: [place] (if you have MyChart) OR A paper copy in the mail If you have any lab test that is abnormal or we need to change your treatment, we will call you to review the results.   Testing/Procedures: NONE ordered at this time of appointment     Follow-Up: At [facility], you and your health needs are our priority.  As part of our continuing mission to provide you with exceptional heart care, we have created designated Provider Care Teams.  These Care Teams include your primary Cardiologist (physician) and Advanced Practice Providers (APPs -  Physician Assistants and Nurse Practitioners) who all work together to provide you with the care you need, when you need it.  We recommend signing up for the patient portal called "MyChart".  Sign up information is provided on this After Visit Summary.  MyChart is used to connect with patients for Virtual Visits (Telemedicine).  Patients are able to view lab/test results, encounter notes, upcoming appointments, etc.  Non-urgent messages can be sent to your provider as well.   To learn more about what you can do with MyChart, go to [email or web address].    Your next appointment:   As Needed   The format for your next appointment:   In Person  Provider:   Claudina Lick, MD    Other Instructions   Important Information About Sugar

## 2022-11-28 ENCOUNTER — Encounter: Payer: Self-pay | Admitting: Internal Medicine

## 2022-11-28 ENCOUNTER — Ambulatory Visit (INDEPENDENT_AMBULATORY_CARE_PROVIDER_SITE_OTHER): Payer: Medicare HMO | Admitting: Internal Medicine

## 2022-11-28 VITALS — BP 107/70 | HR 73 | Temp 97.3°F | Ht 69.0 in | Wt 173.6 lb

## 2022-11-28 DIAGNOSIS — D126 Benign neoplasm of colon, unspecified: Secondary | ICD-10-CM

## 2022-11-28 DIAGNOSIS — K219 Gastro-esophageal reflux disease without esophagitis: Secondary | ICD-10-CM | POA: Diagnosis not present

## 2022-11-28 DIAGNOSIS — K5904 Chronic idiopathic constipation: Secondary | ICD-10-CM

## 2022-11-28 MED ORDER — OMEPRAZOLE 40 MG PO CPDR: 40.0000 mg | DELAYED_RELEASE_CAPSULE | Freq: Two times a day (BID) | ORAL | 3 refills | Status: DC

## 2022-11-28 MED ORDER — SUCRALFATE 1 G PO TABS: 1.0000 g | ORAL_TABLET | Freq: Two times a day (BID) | ORAL | 5 refills | Status: DC

## 2022-11-28 NOTE — Patient Instructions (Addendum)
I am happy to hear that you are doing well.  Continue on omeprazole twice daily for your chronic reflux.  I will change your sucralfate to the tablets which are cheaper.  I have sent this to [facility].    Continue on MiraLAX for your chronic constipation.  We will plan on colonoscopy 2027 given your history of polyps.  Follow-up in 4 months or sooner if needed.  It was very nice seeing you today.  Dr. Abbey Chatters  Lifestyle and home remedies TO MANAGE REFLUX/HEARTBURN    You may eliminate or reduce the frequency of heartburn by making the following lifestyle changes:   Control your weight. Being overweight is a major risk factor for heartburn and GERD. Excess pounds put pressure on your abdomen, pushing up your stomach and causing acid to back up into your esophagus.    Eat smaller meals. 4 TO 6 MEALS A DAY. This reduces pressure on the lower esophageal sphincter, helping to prevent the valve from opening and acid from washing back into your esophagus.     Loosen your belt. Clothes that fit tightly around your waist put pressure on your abdomen and the lower esophageal sphincter.     Eliminate heartburn triggers. Everyone has specific triggers. Common triggers such as fatty or fried foods, spicy food, tomato sauce, carbonated beverages, alcohol, chocolate, mint, garlic, onion, caffeine and nicotine may make heartburn worse.    Avoid stooping or bending. Tying your shoes is OK. Bending over for longer periods to weed your garden isn't, especially soon after eating.    Don't lie down after a meal. Wait at least three to four hours after eating before going to bed, and don't lie down right after eating.

## 2022-11-28 NOTE — Progress Notes (Signed)
Referring Provider: Fayrene Helper, MD Primary Care Physician:  Fayrene Helper, MD Primary GI:  Dr. Abbey Chatters  Chief Complaint  Patient presents with   Follow-up    Pt fine today . Follow up on GERD    HPI:   Sophia [name] is a 72 y.o. adult who presents follow-up of GERD and dysphagia s/p EGD 02/16/2021 revealing benign-appearing esophageal stenosis s/p dilation, gastritis biopsied.  Biopsies were benign.    Colonoscopy also up-to-date 11/27/2020: Nonbleeding internal hemorrhoids, one 6 mm transverse colon polyp resected, diverticulosis in the sigmoid colon.  Pathology with tubular adenoma.  Repeat in 5 years.   Today: Dysphagia resolved.    GERD well controlled on omeprazole 40 mg twice daily.  Takes Carafate as needed for breakthrough symptoms.  Denies abdominal pain, nausea, vomiting.   Bowels move well.  Occasionally, she will use MiraLAX, but nothing routine.  Denies BRBPR or melena.    Past Medical History:  Diagnosis Date   Acute respiratory disease due to COVID-19 virus 08/28/2019   Anxiety    Arthritis    Depression    Diabetes mellitus type 2 in obese ([place]) 11/28/2010   Qualifier: Diagnosis of  By: Moshe Cipro MD, Margaret  Diet controlled in 12/2016    Diabetes mellitus without complication ([place])    GERD (gastroesophageal reflux disease)    Hypercholesteremia    Hypertension    PONV (postoperative nausea and vomiting)    Sleep apnea     Past Surgical History:  Procedure Laterality Date   ABDOMINAL HYSTERECTOMY  11/18/1980   tubes and womb, bleeding and ectopic   ABDOMINAL SURGERY     removal of tumors   BALLOON DILATION N/A 02/16/2021   Procedure: BALLOON DILATION;  Surgeon: [name], DO;  [facility];  Service: Endoscopy;  Laterality: N/A;   BIOPSY  02/16/2021   Procedure: BIOPSY;  Surgeon: [name], DO;  [facility];  Service: Endoscopy;;   BREAST SURGERY Left 11/18/1981   benign tumor   CATARACT  EXTRACTION W/PHACO Right 01/11/2014   Procedure: CATARACT EXTRACTION PHACO AND INTRAOCULAR LENS PLACEMENT (IOC);  Surgeon: Elta Guadeloupe T. Gershon Crane, MD;  Location: [facility];  Service: Ophthalmology;  Laterality: Right;  CDE 5.57   CATARACT EXTRACTION W/PHACO Left 01/25/2014   Procedure: CATARACT EXTRACTION PHACO AND INTRAOCULAR LENS PLACEMENT (IOC);  Surgeon: Elta Guadeloupe T. Gershon Crane, MD;  Location: [facility];  Service: Ophthalmology;  Laterality: Left;  CDE:5.19   COLONOSCOPY  2007 BRBPR   NL EXAM   COLONOSCOPY N/A 04/22/2014   Dr. Fields:Normal mucosa in the terminal ileum/Two COLON polyps REMOVED/ Mild diverticulosis in the ascending colon and transverse colon/The LEFT colon IS redundant/Small internal hemorrhoids. Path: tubular adenoma. Next colonoscopy in 5-10 years   COLONOSCOPY WITH PROPOFOL N/A 11/27/2020   Surgeon: [name], DO;  Nonbleeding internal hemorrhoids, one 6 mm transverse colon polyp resected, diverticulosis in the sigmoid colon.  Pathology with tubular adenoma.  Repeat in 5 years.   ESOPHAGOGASTRODUODENOSCOPY N/A 04/22/2014   Dr. Fields:MICROCYTIC ANEMIA MOST LIKELY DUE TO ASA/VOLTAREN/Small hiatal hernia/MODERATE Non-erosive gastritis. Negative H.pylori   ESOPHAGOGASTRODUODENOSCOPY (EGD) WITH PROPOFOL N/A 02/16/2021   Surgeon: [name], DO; benign-appearing esophageal stenosis s/p dilation, gastritis biopsied.  Biopsies were benign.   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 Gershon Crane, MD;  Location: [facility];  Service: Ophthalmology;  Laterality: Right;   YAG LASER APPLICATION Left 62/56/3893   Procedure: YAG LASER APPLICATION;  Surgeon: Rutherford Guys, MD;  Location: [facility];  Service: Ophthalmology;  Laterality: Left;    Current Outpatient Medications  Medication Sig Dispense Refill   acetaminophen (TYLENOL) 650 MG CR tablet Take 650 mg by mouth 2 (two) times daily.     amLODipine (NORVASC) 10 MG tablet TAKE 1 TABLET EVERY DAY 90 tablet 10   aspirin EC 81 MG tablet Take 81 mg by mouth daily. Swallow whole.     benazepril-hydrochlorthiazide (LOTENSIN HCT) 20-12.5 MG tablet TAKE 2 TABLETS EVERY DAY 180 tablet 0   buPROPion (WELLBUTRIN XL) 150 MG 24 hr tablet TAKE 1 TABLET EVERY DAY 90 tablet 3   busPIRone (BUSPAR) 10 MG tablet Take 1 tablet (10 mg total) by mouth 3 (three) times daily. 270 tablet 3   chlorthalidone (HYGROTON) 25 MG tablet TAKE 1 TABLET EVERY DAY 90 tablet 3   Cyanocobalamin (VITAMIN B-12 PO) Take 2 gummies daily.     diltiazem (CARDIZEM CD) 180 MG 24 hr capsule Take 180 mg by mouth daily.     ferrous gluconate (FERGON) 324 MG tablet Take 324 mg by mouth daily with breakfast.     FLUoxetine (PROZAC) 40 MG capsule TAKE 1 CAPSULE EVERY DAY 90 capsule 10   Multiple Vitamin (MULTIVITAMIN) tablet Take 1 tablet by mouth daily.     omeprazole (PRILOSEC) 40 MG capsule Take one capsule by mouth two times daily for 2 month only 30 capsule 3   OVER THE COUNTER MEDICATION 300 mg 2 (two) times daily. Omega XL     polyethylene glycol (MIRALAX / GLYCOLAX) 17 g packet Take 17 g by mouth daily as needed.     rosuvastatin (CRESTOR) 20 MG tablet Take 1 tablet (20 mg total) by mouth daily. 90 tablet 1   sucralfate (CARAFATE) 1 GM/10ML suspension Take 10 mLs (1  g total) by mouth 2 (two) times daily. 420 mL 1   zinc gluconate 50 MG tablet Take 50 mg by mouth daily.     No current facility-administered medications for this visit.    Allergies as of 11/28/2022   (No Known Allergies)    Family History  Problem Relation Age of Onset   Colon polyps Sister 84   Diabetes Sister    Hypertension Sister    Kidney disease Sister    Arthritis Father    Cancer Father        prostate    Hypertension Father    Diabetes Sister    Hypertension Sister    CAD Paternal Grandmother    Colon cancer Neg Hx     Social History   Socioeconomic History   Marital status: Married    Spouse name: Not on file   Number of children: Not on file   Years of education: Not on file   Highest education level:  Not on file  Occupational History   Not on file  Tobacco Use   Smoking status: Former    Packs/day: 0.25    Years: 20.00    Total pack years: 5.00    Types: Cigarettes    Quit date: 01/05/1990    Years since quitting: 32.9   Smokeless tobacco: Former    Types: Snuff    Quit date: 03/28/2022   Tobacco comments:    Client said she no longer is using snuff. She uses marijuana occasionally per client  Vaping Use   Vaping Use: Never used  Substance and Sexual Activity   Alcohol use: No   Drug use: Yes    Types: Marijuana    Comment: occ marijuana   Sexual activity: Not Currently    Birth control/protection: Surgical  Other Topics Concern   Not on file  Social History Narrative   Not on file   Social Determinants of Health   Financial Resource Strain: Low Risk  (08/13/2022)   Overall Financial Resource Strain (CARDIA)    Difficulty of Paying Living Expenses: Not very hard  Food Insecurity: No Food Insecurity (08/13/2022)   Hunger Vital Sign    Worried About Running Out of Food in the Last Year: Never true    Ran Out of Food in the Last Year: Never true  Transportation Needs: No Transportation Needs (08/13/2022)   PRAPARE - Armed forces logistics/support/administrative officer (Medical): No    Lack of Transportation (Non-Medical): No  Physical Activity: Insufficiently Active (08/13/2022)   Exercise Vital Sign    Days of Exercise per Week: 3 days    Minutes of Exercise per Session: 20 min  Stress: No Stress Concern Present (08/13/2022)   [place]    Feeling of Stress : Not at all  Recent Concern: Stress - Stress Concern Present (06/20/2022)   [place]    Feeling of Stress : To some extent  Social Connections: Moderately Integrated (08/13/2022)   Social Connection and Isolation Panel [NHANES]    Frequency of Communication with Friends and Family: More than three times a week    Frequency of Social Gatherings with Friends and Family: More than three times a week    Attends Religious Services: More than 4 times per year    Active Member of [employer] or Organizations: No    Attends Archivist Meetings: Never    Marital Status: Married    Subjective: Review of Systems  Constitutional:  Negative for chills and fever.  HENT:  Negative for congestion and hearing loss.   Eyes:  Negative for blurred vision and double vision.  Respiratory:  Negative for cough and shortness of breath.   Cardiovascular:  Negative for chest pain and palpitations.  Gastrointestinal:  Positive for constipation and heartburn. Negative for abdominal pain, blood in stool, diarrhea, melena and vomiting.  Genitourinary:  Negative for dysuria and urgency.  Musculoskeletal:  Negative for joint pain and myalgias.  Skin:  Negative for itching and rash.  Neurological:  Negative for dizziness and headaches.  Psychiatric/Behavioral:  Negative for depression. The patient is not nervous/anxious.      Objective: BP 107/70   Pulse 73   Temp (!) 97.3 F (36.3 C)   Ht '5\' 9"'$  (1.753 m)   Wt 173 lb 9.6 oz (78.7 kg)   BMI 25.64 kg/m   Physical Exam Constitutional:  Appearance: Normal appearance.  HENT:     Head: Normocephalic and atraumatic.  Eyes:     Extraocular Movements: Extraocular movements intact.     Conjunctiva/sclera: Conjunctivae normal.  Cardiovascular:     Rate and Rhythm: Normal rate and regular rhythm.  Pulmonary:     Effort: Pulmonary effort is normal.     Breath sounds: Normal breath sounds.  Abdominal:     General: Bowel sounds are normal.     Palpations: Abdomen is soft.  Musculoskeletal:        General: No swelling. Normal range of motion.     Cervical back: Normal range of motion and neck supple.  Skin:    General: Skin is warm and dry.     Coloration: Skin is not jaundiced.  Neurological:     General: No focal deficit present.     Mental Status: She is alert and oriented to person, place, and time.  Psychiatric:        Mood and Affect: Mood normal.        Behavior: Behavior normal.      Assessment: *Chronic GERD-well-controlled omeprazole twice daily *Chronic idiopathic constipation-well-controlled MiraLAX *Dysphagia-resolved status post dilation *Adenomatous colon polyps  Plan: Chronic GERD well-controlled omeprazole twice daily.  Will continue.  I will change her Carafate to the tablet form given cost issues.  Continue on MiraLAX for chronic constipation.  Repeat colonoscopy 2027 for surveillance purposes.  Follow-up in 4 months or sooner if needed.  11/28/2022 3:26 PM   Disclaimer: This note was dictated with voice recognition software. Similar sounding words can inadvertently be transcribed and may not be corrected upon review.

## 2022-11-29 ENCOUNTER — Other Ambulatory Visit: Payer: Self-pay | Admitting: Family Medicine

## 2023-01-22 ENCOUNTER — Other Ambulatory Visit: Payer: Self-pay | Admitting: Family Medicine

## 2023-02-24 DIAGNOSIS — I10 Essential (primary) hypertension: Secondary | ICD-10-CM | POA: Diagnosis not present

## 2023-02-24 DIAGNOSIS — E785 Hyperlipidemia, unspecified: Secondary | ICD-10-CM | POA: Diagnosis not present

## 2023-02-24 DIAGNOSIS — R7303 Prediabetes: Secondary | ICD-10-CM | POA: Diagnosis not present

## 2023-02-24 DIAGNOSIS — E559 Vitamin D deficiency, unspecified: Secondary | ICD-10-CM | POA: Diagnosis not present

## 2023-02-25 ENCOUNTER — Encounter: Payer: Self-pay | Admitting: Family Medicine

## 2023-02-25 ENCOUNTER — Ambulatory Visit (INDEPENDENT_AMBULATORY_CARE_PROVIDER_SITE_OTHER): Payer: Medicare HMO | Admitting: Family Medicine

## 2023-02-25 ENCOUNTER — Ambulatory Visit (HOSPITAL_COMMUNITY)
Admission: RE | Admit: 2023-02-25 | Discharge: 2023-02-25 | Disposition: A | Discharge status: Home / Self Care | Payer: Medicare HMO | Source: Ambulatory Visit | Attending: Family Medicine | Admitting: Family Medicine

## 2023-02-25 VITALS — BP 115/71 | HR 70 | Ht 69.0 in | Wt 179.1 lb

## 2023-02-25 DIAGNOSIS — M542 Cervicalgia: Secondary | ICD-10-CM | POA: Diagnosis not present

## 2023-02-25 DIAGNOSIS — F418 Other specified anxiety disorders: Secondary | ICD-10-CM

## 2023-02-25 DIAGNOSIS — I10 Essential (primary) hypertension: Secondary | ICD-10-CM | POA: Diagnosis not present

## 2023-02-25 DIAGNOSIS — F324 Major depressive disorder, single episode, in partial remission: Secondary | ICD-10-CM

## 2023-02-25 LAB — CMP14+EGFR
ALT: 22 IU/L (ref 0–32)
AST: 16 IU/L (ref 0–40)
Albumin/Globulin Ratio: 2 (ref 1.2–2.2)
Albumin: 4.7 g/dL (ref 3.8–4.8)
Alkaline Phosphatase: 71 IU/L (ref 44–121)
BUN/Creatinine Ratio: 28 (ref 12–28)
BUN: 30 mg/dL — ABNORMAL HIGH (ref 8–27)
Bilirubin Total: <0.2 mg/dL (ref 0.0–1.2)
CO2: 25 mmol/L (ref 20–29)
Calcium: 10.3 mg/dL (ref 8.7–10.3)
Chloride: 99 mmol/L (ref 96–106)
Creatinine, Ser: 1.06 mg/dL — ABNORMAL HIGH (ref 0.57–1.00)
Globulin, Total: 2.4 g/dL (ref 1.5–4.5)
Glucose: 98 mg/dL (ref 70–99)
Potassium: 4 mmol/L (ref 3.5–5.2)
Sodium: 142 mmol/L (ref 134–144)
Total Protein: 7.1 g/dL (ref 6.0–8.5)
eGFR: 56 mL/min/{1.73_m2} — ABNORMAL LOW (ref >59)

## 2023-02-25 LAB — LIPID PANEL
Chol/HDL Ratio: 2.1 ratio (ref 0.0–4.4)
Cholesterol, Total: 159 mg/dL (ref 100–199)
HDL: 75 mg/dL (ref >39)
LDL Chol Calc (NIH): 66 mg/dL (ref 0–99)
Triglycerides: 102 mg/dL (ref 0–149)
VLDL Cholesterol Cal: 18 mg/dL (ref 5–40)

## 2023-02-25 LAB — CBC
Hematocrit: 37.9 % (ref 34.0–46.6)
Hemoglobin: 12 g/dL (ref 11.1–15.9)
MCH: 26 pg — ABNORMAL LOW (ref 26.6–33.0)
MCHC: 31.7 g/dL (ref 31.5–35.7)
MCV: 82 fL (ref 79–97)
Platelets: 223 10*3/uL (ref 150–450)
RBC: 4.61 x10E6/uL (ref 3.77–5.28)
RDW: 14.1 % (ref 11.7–15.4)
WBC: 5.2 10*3/uL (ref 3.4–10.8)

## 2023-02-25 LAB — TSH: TSH: 2.31 u[IU]/mL (ref 0.450–4.500)

## 2023-02-25 LAB — HEMOGLOBIN A1C
Est. average glucose Bld gHb Est-mCnc: 140 mg/dL
Hgb A1c MFr Bld: 6.5 % — ABNORMAL HIGH (ref 4.8–5.6)

## 2023-02-25 LAB — VITAMIN D 25 HYDROXY (VIT D DEFICIENCY, FRACTURES): Vit D, 25-Hydroxy: 37.6 ng/mL (ref 30.0–100.0)

## 2023-02-25 MED ORDER — CYCLOBENZAPRINE HCL 5 MG PO TABS: 5.0000 mg | ORAL_TABLET | Freq: Every day | ORAL | 0 refills | Status: DC

## 2023-02-25 MED ORDER — METHYLPREDNISOLONE ACETATE 80 MG/ML IJ SUSP
40.0000 mg | Freq: Once | INTRAMUSCULAR | Status: AC
  Administered 2023-02-25: 40 mg via INTRAMUSCULAR

## 2023-02-25 MED ORDER — BUPROPION HCL ER (XL) 300 MG PO TB24: 300.0000 mg | ORAL_TABLET | Freq: Every day | ORAL | 1 refills | Status: DC

## 2023-02-25 MED ORDER — KETOROLAC TROMETHAMINE 60 MG/2ML IM SOLN
60.0000 mg | Freq: Once | INTRAMUSCULAR | Status: AC
  Administered 2023-02-25: 60 mg via INTRAMUSCULAR

## 2023-02-25 MED ORDER — PREDNISONE 10 MG PO TABS: 10.0000 mg | ORAL_TABLET | Freq: Two times a day (BID) | ORAL | 0 refills | Status: DC

## 2023-02-25 MED ORDER — SUCRALFATE 1 G PO TABS: 1.0000 g | ORAL_TABLET | Freq: Two times a day (BID) | ORAL | 1 refills | Status: DC

## 2023-02-25 NOTE — Patient Instructions (Addendum)
F/u in 6 to 8 weeks, call if you need me sooner  Neck pain and spasm is from arthritis. Toradol 60 mg IM in office and depo medrol 40 mg iM in office.  Start predniosne twice daily for neck pain and muscle relaxant at bedtime  Please get X ray of your neck today   Wellbutrin  is increased to 300 mg daily  Thanks for choosing [facility], we consider it a privelige to serve you.

## 2023-03-02 NOTE — Assessment & Plan Note (Addendum)
Uncontrolled , toradol and depo medrol in office folowed by short course of prednisone and also X ray of C spine and bed time muscle relaxant

## 2023-03-02 NOTE — Assessment & Plan Note (Signed)
Controlled, no change in medication DASH diet and commitment to daily physical activity for a minimum of 30 minutes discussed and encouraged, as a part of hypertension management. The importance of attaining a healthy weight is also discussed.     02/25/2023    3:56 PM 11/28/2022    3:05 PM 11/15/2022    2:15 PM 11/14/2022    8:53 AM 10/16/2022    9:10 PM 10/15/2022    3:08 PM 10/15/2022    3:02 PM  BP/Weight  Systolic BP 115 107 116 112  120 145  Diastolic BP 71 70 64 77  72 74  Wt. (Lbs) 179.08 173.6 171.2 171.08 169    BMI 26.45 kg/m2 25.64 kg/m2 25.28 kg/m2 25.26 kg/m2 24.96 kg/m2

## 2023-03-02 NOTE — Progress Notes (Signed)
   KAIRIE LUKER     MRN: [id number]      DOB: 07-Jan-1951   HPI Ms. Hodgdon is here for follow up and re-evaluation of chronic medical conditions, medication management and review of any available recent lab and radiology data.  Preventive health is updated, specifically  Cancer screening and Immunization.   Questions or concerns regarding consultations or procedures which the PT has had in the interim are  addressed. 1 week h/o neck pain radiating down arms, aggravated by movement of the neck, also increased stress as far as needs of her family are concerned and illness   ROS Denies recent fever or chills. Denies sinus pressure, nasal congestion, ear pain or sore throat. Denies chest congestion, productive cough or wheezing. Denies chest pains, palpitations and leg swelling Denies abdominal pain, nausea, vomiting,diarrhea or constipation.   Denies dysuria, frequency, hesitancy or incontinence. Denies headaches, seizures, numbness, or tingling.  Denies skin break down or rash.   PE  BP 115/71 (BP Location: Right Arm, Patient Position: Sitting, Cuff Size: Large)   Pulse 70   Ht 5\' 9"  (1.753 m)   Wt 179 lb 1.3 oz (81.2 kg)   SpO2 96%   BMI 26.45 kg/m   Patient alert and oriented and in no cardiopulmonary distress.  HEENT: No facial asymmetry, EOMI,     Neck decreased ROM and neck spasm  Chest: Clear to auscultation bilaterally.  CVS: S1, S2 no murmurs, no S3.Regular rate.  ABD: Soft non tender.   Ext: No edema  MS: decreased  ROM spine, shoulders, hips and knees.  Skin: Intact, no ulcerations or rash noted.  Psych: Good eye contact, normal affect. Memory intact mildly  anxious and  depressed appearing.  CNS: CN 2-12 intact, power,  normal throughout.no focal deficits noted.   Assessment & Plan  Essential hypertension Controlled, no change in medication DASH diet and commitment to daily physical activity for a minimum of 30 minutes discussed and encouraged, as a  part of hypertension management. The importance of attaining a healthy weight is also discussed.     02/25/2023    3:56 PM 11/28/2022    3:05 PM 11/15/2022    2:15 PM 11/14/2022    8:53 AM 10/16/2022    9:10 PM 10/15/2022    3:08 PM 10/15/2022    3:02 PM  BP/Weight  Systolic BP 115 107 116 112  120 145  Diastolic BP 71 70 64 77  72 74  Wt. (Lbs) 179.08 173.6 171.2 171.08 169    BMI 26.45 kg/m2 25.64 kg/m2 25.28 kg/m2 25.26 kg/m2 24.96 kg/m2         Depression, major, single episode, in partial remission (HCC) Uncontrolled wellbutrin dose increased , needs to speak with therapist  Neck pain Uncontrolled , toradol and depo medrol in office folowed by short course of prednisone and also X ray of C spine and bed time muscle relaxant

## 2023-03-02 NOTE — Assessment & Plan Note (Addendum)
Uncontrolled wellbutrin dose increased , needs to speak with therapist

## 2023-03-03 ENCOUNTER — Telehealth: Payer: Self-pay | Admitting: Family Medicine

## 2023-03-03 NOTE — Telephone Encounter (Signed)
We called her on Friday, when she called back our office was already closed. She is following up about Xray results.

## 2023-03-03 NOTE — Telephone Encounter (Signed)
Pt aware.

## 2023-03-05 ENCOUNTER — Ambulatory Visit: Payer: Medicare HMO | Admitting: Family Medicine

## 2023-03-20 ENCOUNTER — Ambulatory Visit: Payer: Medicare HMO | Admitting: Family Medicine

## 2023-03-24 ENCOUNTER — Encounter: Payer: Self-pay | Admitting: Internal Medicine

## 2023-03-27 DIAGNOSIS — H524 Presbyopia: Secondary | ICD-10-CM | POA: Diagnosis not present

## 2023-03-27 DIAGNOSIS — Z961 Presence of intraocular lens: Secondary | ICD-10-CM | POA: Diagnosis not present

## 2023-03-27 DIAGNOSIS — H52203 Unspecified astigmatism, bilateral: Secondary | ICD-10-CM | POA: Diagnosis not present

## 2023-04-09 ENCOUNTER — Encounter: Payer: Self-pay | Admitting: Family Medicine

## 2023-04-09 ENCOUNTER — Ambulatory Visit: Payer: Medicare HMO | Admitting: Family Medicine

## 2023-04-09 ENCOUNTER — Ambulatory Visit (INDEPENDENT_AMBULATORY_CARE_PROVIDER_SITE_OTHER): Payer: Medicare HMO | Admitting: Family Medicine

## 2023-04-09 VITALS — BP 116/69 | HR 80 | Ht 69.0 in | Wt 192.0 lb

## 2023-04-09 DIAGNOSIS — R0609 Other forms of dyspnea: Secondary | ICD-10-CM

## 2023-04-09 DIAGNOSIS — R0602 Shortness of breath: Secondary | ICD-10-CM | POA: Diagnosis not present

## 2023-04-09 DIAGNOSIS — I10 Essential (primary) hypertension: Secondary | ICD-10-CM

## 2023-04-09 DIAGNOSIS — F418 Other specified anxiety disorders: Secondary | ICD-10-CM | POA: Diagnosis not present

## 2023-04-09 DIAGNOSIS — E785 Hyperlipidemia, unspecified: Secondary | ICD-10-CM | POA: Diagnosis not present

## 2023-04-09 DIAGNOSIS — F5104 Psychophysiologic insomnia: Secondary | ICD-10-CM

## 2023-04-09 DIAGNOSIS — E1159 Type 2 diabetes mellitus with other circulatory complications: Secondary | ICD-10-CM

## 2023-04-09 DIAGNOSIS — Z1231 Encounter for screening mammogram for malignant neoplasm of breast: Secondary | ICD-10-CM | POA: Diagnosis not present

## 2023-04-09 DIAGNOSIS — R7303 Prediabetes: Secondary | ICD-10-CM

## 2023-04-09 MED ORDER — BUPROPION HCL ER (XL) 150 MG PO TB24: 150.0000 mg | ORAL_TABLET | Freq: Every day | ORAL | 3 refills | Status: DC

## 2023-04-09 MED ORDER — ALBUTEROL SULFATE HFA 108 (90 BASE) MCG/ACT IN AERS: 2.0000 | INHALATION_SPRAY | Freq: Four times a day (QID) | RESPIRATORY_TRACT | 0 refills | Status: DC | PRN

## 2023-04-09 NOTE — Patient Instructions (Addendum)
F/U inn 4 months, call if you need me sooner  HBA1C , non fasting chem 7 and EGFR, 3 to 5 days before next appointment   You are referred for diabetic ed  Please schedule a mammogram at checkout for 09/01/or after, at [facility]   Happy 72[facility]  [place] Ouch when it comes!   Lower dose Wellbutrin resumed  You are referred to Nutrtition / diabetic ed  Albuterol inhaler and referral to Lung Specialist has been done  Sweets, candy, sugar has run your blood sugar avg back into a diabetic range.Changing your food choice starting today will correct this and you will also lose th 19 pounds yo put on this past 4 months  It is important that you exercise regularly at least 30 minutes 5 times a week. If you develop chest pain, have severe difficulty breathing, or feel very tired, stop exercising immediately and seek medical attention    Thanks for choosing [facility], we consider it a privelige to serve you.

## 2023-04-09 NOTE — Assessment & Plan Note (Signed)
Increasing shortness of breathin past 4 month

## 2023-04-09 NOTE — Progress Notes (Signed)
Sophia [name]     MRN: [id number]      DOB: [birth year]  Chief Complaint  Patient presents with   Follow-up    Follow up requesting portable oxygen, discuss dose of bupropion,requesting sleeping medication    HPI [name] is here for follow up and re-evaluation of chronic medical conditions, medication management and review of any available recent lab and radiology data.  Preventive health is updated, specifically  Cancer screening and Immunization.   Requesting inhaler to help with breathing due to increased SOB The PT got headaches with 300 mg wellbutrin wants to resume lower dose C/o increased shortness of breath with activity, feels this is as a result of Covid, which she had in 2020, says notes this started in December ROS Denies recent fever or chills. Denies sinus pressure, nasal congestion, ear pain or sore throat. Denies chest congestion, productive cough or wheezing. Denies chest pains, palpitations and leg swelling Denies abdominal pain, nausea, vomiting,diarrhea or constipation.   Denies dysuria, frequency, hesitancy or incontinence. Denies joint pain, swelling and limitation in mobility. Denies headaches, seizures, numbness, or tingling. Denies uncontrolled  depression, anxiety or insomnia. Denies skin break down or rash.   PE  BP 116/69 (BP Location: Right Arm, Patient Position: Sitting, Cuff Size: Large)   Pulse 80   Ht 5\' 9"  (1.753 m)   Wt 192 lb (87.1 kg)   SpO2 96%   BMI 28.35 kg/m   Patient alert and oriented and in no cardiopulmonary distress.  HEENT: No facial asymmetry, EOMI,     Neck supple .  Chest: Clear to auscultation bilaterally.  CVS: S1, S2 no murmurs, no S3.Regular rate.  ABD: Soft non tender.   Ext: No edema  MS: decreased  ROM spine, shoulders, hips and knees.  Skin: Intact, no ulcerations or rash noted.  Psych: Good eye contact, normal affect. Memory intact not anxious or depressed appearing.  CNS: CN 2-12 intact, power,   normal throughout.no focal deficits noted.   Assessment & Plan  DYSPNEA Increasing shortness of breathin past 4 month  Depression with anxiety Reduce Wellbutrin dose to 150 mg daily  DOE (dyspnea on exertion) Notes that this is progressing , feels symptoms first developed after covd infection , albuterol prescribed and refer to Pulmonary for eval  Essential hypertension Controlled, no change in medication DASH diet and commitment to daily physical activity for a minimum of 30 minutes discussed and encouraged, as a part of hypertension management. The importance of attaining a healthy weight is also discussed.     04/09/2023    3:20 PM 02/25/2023    3:56 PM 11/28/2022    3:05 PM 11/15/2022    2:15 PM 11/14/2022    8:53 AM 10/16/2022    9:10 PM 10/15/2022    3:08 PM  BP/Weight  Systolic BP 116 115 107 116 112  120  Diastolic BP 69 71 70 64 77  72  Wt. (Lbs) 192 179.08 173.6 171.2 171.08 169   BMI 28.35 kg/m2 26.45 kg/m2 25.64 kg/m2 25.28 kg/m2 25.26 kg/m2 24.96 kg/m2        Hyperlipidemia with target LDL less than 100 Hyperlipidemia:Low fat diet discussed and encouraged.   Lipid Panel  Lab Results  Component Value Date   CHOL 159 02/24/2023   HDL 75 02/24/2023   LDLCALC 66 02/24/2023   TRIG 102 02/24/2023   CHOLHDL 2.1 02/24/2023     Controlled, no change in medication   Type 2 diabetes mellitus with vascular disease (  HCC) [name] is reminded of the importance of commitment to daily physical activity for 30 minutes or more, as able and the need to limit carbohydrate intake to 30 to 60 grams per meal to help with blood sugar control.   [name] is reminded of the importance of daily foot exam, annual eye examination, and good blood sugar, blood pressure and cholesterol control.     Latest Ref Rng & Units 02/24/2023    8:57 AM 08/11/2022    9:13 AM 05/31/2022   10:09 AM 01/24/2022    9:14 AM 09/19/2021   10:06 AM  Diabetic Labs  HbA1c 4.8 - 5.6 % 6.5   6.0   5.9    Chol 100 - 199 mg/dL 161   096  045    HDL >40 mg/dL 75   58  65    Calc LDL 0 - 99 mg/dL 66   55  60    Triglycerides 0 - 149 mg/dL 981   191  96    Creatinine 0.57 - 1.00 mg/dL 4.78  2.95  6.21  3.08  0.92       04/09/2023    3:20 PM 02/25/2023    3:56 PM 11/28/2022    3:05 PM 11/15/2022    2:15 PM 11/14/2022    8:53 AM 10/16/2022    9:10 PM 10/15/2022    3:08 PM  BP/Weight  Systolic BP 116 115 107 116 112  120  Diastolic BP 69 71 70 64 77  72  Wt. (Lbs) 192 179.08 173.6 171.2 171.08 169   BMI 28.35 kg/m2 26.45 kg/m2 25.64 kg/m2 25.28 kg/m2 25.26 kg/m2 24.96 kg/m2       Latest Ref Rng & Units 11/14/2022    9:00 AM 03/25/2022   12:00 AM  Foot/eye exam completion dates  Eye Exam No Retinopathy No Retinopathy  No Retinopathy       No Retinopathy      Foot Form Completion  Done      This result is from an external source.   Multiple values from one day are sorted in reverse-chronological order      Refer nutrition  Insomnia Sleep hygiene reviewed and written information offered also. Prescription sent for  medication needed.

## 2023-04-13 ENCOUNTER — Encounter: Payer: Self-pay | Admitting: Family Medicine

## 2023-04-13 DIAGNOSIS — G4700 Insomnia, unspecified: Secondary | ICD-10-CM | POA: Insufficient documentation

## 2023-04-13 DIAGNOSIS — E1169 Type 2 diabetes mellitus with other specified complication: Secondary | ICD-10-CM | POA: Insufficient documentation

## 2023-04-13 DIAGNOSIS — E1159 Type 2 diabetes mellitus with other circulatory complications: Secondary | ICD-10-CM | POA: Insufficient documentation

## 2023-04-13 MED ORDER — MELATONIN 3 MG PO CAPS: 1.0000 | ORAL_CAPSULE | Freq: Every day | ORAL | 5 refills | Status: DC

## 2023-04-13 MED ORDER — FLUOXETINE HCL 40 MG PO CAPS: 40.0000 mg | ORAL_CAPSULE | Freq: Every day | ORAL | 3 refills | Status: DC

## 2023-04-13 MED ORDER — AMLODIPINE BESYLATE 10 MG PO TABS: 10.0000 mg | ORAL_TABLET | Freq: Every day | ORAL | 3 refills | Status: DC

## 2023-04-13 MED ORDER — BUSPIRONE HCL 10 MG PO TABS: 10.0000 mg | ORAL_TABLET | Freq: Three times a day (TID) | ORAL | 3 refills | Status: DC

## 2023-04-13 NOTE — Assessment & Plan Note (Signed)
Hyperlipidemia:Low fat diet discussed and encouraged.   Lipid Panel  Lab Results  Component Value Date   CHOL 159 02/24/2023   HDL 75 02/24/2023   LDLCALC 66 02/24/2023   TRIG 102 02/24/2023   CHOLHDL 2.1 02/24/2023     Controlled, no change in medication

## 2023-04-13 NOTE — Assessment & Plan Note (Signed)
Sophia [name] is reminded of the importance of commitment to daily physical activity for 30 minutes or more, as able and the need to limit carbohydrate intake to 30 to 60 grams per meal to help with blood sugar control.   Sophia [name] is reminded of the importance of daily foot exam, annual eye examination, and good blood sugar, blood pressure and cholesterol control.     Latest Ref Rng & Units 02/24/2023    8:57 AM 08/11/2022    9:13 AM 05/31/2022   10:09 AM 01/24/2022    9:14 AM 09/19/2021   10:06 AM  Diabetic Labs  HbA1c 4.8 - 5.6 % 6.5   6.0  5.9    Chol 100 - 199 mg/dL 161   096  045    HDL >40 mg/dL 75   58  65    Calc LDL 0 - 99 mg/dL 66   55  60    Triglycerides 0 - 149 mg/dL 981   191  96    Creatinine 0.57 - 1.00 mg/dL 4.78  2.95  6.21  3.08  0.92       04/09/2023    3:20 PM 02/25/2023    3:56 PM 11/28/2022    3:05 PM 11/15/2022    2:15 PM 11/14/2022    8:53 AM 10/16/2022    9:10 PM 10/15/2022    3:08 PM  BP/Weight  Systolic BP 116 115 107 116 112  120  Diastolic BP 69 71 70 64 77  72  Wt. (Lbs) 192 179.08 173.6 171.2 171.08 169   BMI 28.35 kg/m2 26.45 kg/m2 25.64 kg/m2 25.28 kg/m2 25.26 kg/m2 24.96 kg/m2       Latest Ref Rng & Units 11/14/2022    9:00 AM 03/25/2022   12:00 AM  Foot/eye exam completion dates  Eye Exam No Retinopathy No Retinopathy  No Retinopathy       No Retinopathy      Foot Form Completion  Done      This result is from an external source.   Multiple values from one day are sorted in reverse-chronological order      Refer nutrition

## 2023-04-13 NOTE — Assessment & Plan Note (Signed)
Sleep hygiene reviewed and written information offered also. Prescription sent for  medication needed.  

## 2023-04-13 NOTE — Assessment & Plan Note (Signed)
Notes that this is progressing , feels symptoms first developed after covd infection , albuterol prescribed and refer to Pulmonary for eval

## 2023-04-13 NOTE — Assessment & Plan Note (Signed)
Controlled, no change in medication DASH diet and commitment to daily physical activity for a minimum of 30 minutes discussed and encouraged, as a part of hypertension management. The importance of attaining a healthy weight is also discussed.     04/09/2023    3:20 PM 02/25/2023    3:56 PM 11/28/2022    3:05 PM 11/15/2022    2:15 PM 11/14/2022    8:53 AM 10/16/2022    9:10 PM 10/15/2022    3:08 PM  BP/Weight  Systolic BP 116 115 107 116 112  120  Diastolic BP 69 71 70 64 77  72  Wt. (Lbs) 192 179.08 173.6 171.2 171.08 169   BMI 28.35 kg/m2 26.45 kg/m2 25.64 kg/m2 25.28 kg/m2 25.26 kg/m2 24.96 kg/m2

## 2023-04-13 NOTE — Assessment & Plan Note (Signed)
Reduce Wellbutrin dose to 150 mg daily

## 2023-04-21 ENCOUNTER — Other Ambulatory Visit: Payer: Self-pay

## 2023-04-21 MED ORDER — BUSPIRONE HCL 10 MG PO TABS: 10.0000 mg | ORAL_TABLET | Freq: Three times a day (TID) | ORAL | 3 refills | Status: DC

## 2023-04-25 ENCOUNTER — Ambulatory Visit: Payer: Medicare HMO | Admitting: Internal Medicine

## 2023-04-25 ENCOUNTER — Ambulatory Visit (HOSPITAL_COMMUNITY)
Admission: RE | Admit: 2023-04-25 | Discharge: 2023-04-25 | Disposition: A | Discharge status: Home / Self Care | Payer: Medicare HMO | Source: Ambulatory Visit | Attending: Internal Medicine | Admitting: Internal Medicine

## 2023-04-25 ENCOUNTER — Encounter: Payer: Self-pay | Admitting: Internal Medicine

## 2023-04-25 VITALS — BP 110/66 | HR 80 | Ht 69.0 in | Wt 192.6 lb

## 2023-04-25 DIAGNOSIS — R0609 Other forms of dyspnea: Secondary | ICD-10-CM | POA: Insufficient documentation

## 2023-04-25 DIAGNOSIS — I10 Essential (primary) hypertension: Secondary | ICD-10-CM | POA: Diagnosis not present

## 2023-04-25 DIAGNOSIS — R0600 Dyspnea, unspecified: Secondary | ICD-10-CM | POA: Diagnosis not present

## 2023-04-25 MED ORDER — OLMESARTAN MEDOXOMIL-HCTZ 20-12.5 MG PO TABS: 1.0000 | ORAL_TABLET | Freq: Every day | ORAL | 11 refills | Status: DC

## 2023-04-25 NOTE — Patient Instructions (Signed)
Stop the benazapril and start olmesartan 20-12.5 one daily in its place  If blood pressure is too low, stop chlorthalidone  - if still too low, hold amlodipine  Please remember to go to the  x-ray department  @  [facility] for your tests - we will call you with the results when they are available     Please schedule a follow up office visit in 6 weeks, call sooner if needed

## 2023-04-25 NOTE — Progress Notes (Unsigned)
Sophia [name], adult    DOB: January 06, 1951    MRN: [id number]   Brief patient profile:  19  yobf  quit smoking 1991  referred to pulmonary clinic in [place]  04/25/2023 by Lodema Hong  for doe ever since 08/2019 COVID 19 gradually worse with neg cardiac eval.      History of Present Illness  04/25/2023  Pulmonary/ 1st office eval/ Sherene Sires / [place] [facility] on ACEi  Chief Complaint  Patient presents with   Consult    Pt consult for SOB - states that is has been ongoing 38mo. PCP did provide albuterol inhaler that pt reports taking 1-3/day  Dyspnea:  food lion x 4 aisles/ used to do ex bike fatigue > sob Cough: ever since breathing uses H1  Sleep: level bed one pillow  SABA use: now needing saba p ex  02: none     Past Medical History:  Diagnosis Date   Acute respiratory disease due to COVID-19 virus 08/28/2019   Anxiety    Arthritis    Depression    Diabetes mellitus type 2 in obese 11/28/2010   Qualifier: Diagnosis of  By: Lodema Hong MD, Margaret  Diet controlled in 12/2016    Diabetes mellitus without complication (HCC)    GERD (gastroesophageal reflux disease)    Hypercholesteremia    Hypertension    PONV (postoperative nausea and vomiting)    Sleep apnea     Outpatient Medications Prior to Visit  Medication Sig Dispense Refill   acetaminophen (TYLENOL) 650 MG CR tablet Take 650 mg by mouth 2 (two) times daily.     albuterol (VENTOLIN HFA) 108 (90 Base) MCG/ACT inhaler Inhale 2 puffs into the lungs every 6 (six) hours as needed for wheezing or shortness of breath. 8 g 0   amLODipine (NORVASC) 10 MG tablet Take 1 tablet (10 mg total) by mouth daily. 90 tablet 3   aspirin EC 81 MG tablet Take 81 mg by mouth daily. Swallow whole.     benazepril-hydrochlorthiazide (LOTENSIN HCT) 20-12.5 MG tablet TAKE 2 TABLETS EVERY DAY 180 tablet 3   buPROPion (WELLBUTRIN XL) 150 MG 24 hr tablet Take 1 tablet (150 mg total) by mouth daily. 90 tablet 3   busPIRone (BUSPAR) 10 MG tablet Take 1 tablet  (10 mg total) by mouth 3 (three) times daily. 270 tablet 3   chlorthalidone (HYGROTON) 25 MG tablet TAKE 1 TABLET EVERY DAY 90 tablet 3   Cyanocobalamin (VITAMIN B-12 PO) Take 2 gummies daily.     ferrous gluconate (FERGON) 324 MG tablet Take 324 mg by mouth daily with breakfast.     FLUoxetine (PROZAC) 40 MG capsule Take 1 capsule (40 mg total) by mouth daily. 90 capsule 3   Melatonin 3 MG CAPS Take 1 capsule (3 mg total) by mouth at bedtime. 30 capsule 5   Multiple Vitamin (MULTIVITAMIN) tablet Take 1 tablet by mouth daily.     omeprazole (PRILOSEC) 40 MG capsule Take 1 capsule (40 mg total) by mouth 2 (two) times daily. Take one capsule by mouth two times daily for 2 month only 180 capsule 3   OVER THE COUNTER MEDICATION 300 mg 2 (two) times daily. Omega XL     polyethylene glycol (MIRALAX / GLYCOLAX) 17 g packet Take 17 g by mouth daily as needed.     rosuvastatin (CRESTOR) 20 MG tablet TAKE 1 TABLET EVERY DAY 90 tablet 3   sucralfate (CARAFATE) 1 g tablet Take 1 tablet (1 g total) by mouth 2 (  two) times daily. 180 tablet 1   No facility-administered medications prior to visit.     Objective:     BP 110/66   Pulse 80   Ht 5\' 9"  (1.753 m)   Wt 192 lb 9.6 oz (87.4 kg)   SpO2 97% Comment: RA  BMI 28.44 kg/m   SpO2: 97 % (RA)        Assessment   No problem-specific Assessment & Plan notes found for this encounter.     Sandrea Hughs, MD 04/25/2023

## 2023-04-26 ENCOUNTER — Encounter: Payer: Self-pay | Admitting: Internal Medicine

## 2023-04-26 NOTE — Assessment & Plan Note (Addendum)
Onset 08/2019 with covid 19 and progressive since with assoc new PNDS/ cough   Symptoms are  disproportionate to objective findings and not clear to what extent this is actually a pulmonary  problem but pt does appear to have difficult to sort out respiratory symptoms of unknown origin for which  DDX  = almost all start with A and  include Adherence, Ace Inhibitors, Acid Reflux, Active Sinus Disease, Alpha 1 Antitripsin deficiency, Anxiety masquerading as Airways dz,  ABPA,  Allergy/ asthma  Aspiration (esp in elderly), Adverse effects of meds,  Active smoking or Vaping, A bunch of PE's/clot burden (a few small clots can't cause this syndrome unless there is already severe underlying pulm or vascular dz with poor reserve),  Anemia or thyroid disorder, plus two Bs  = Bronchiectasis and Beta blocker use..and one C= CHF   Allergy/ asthma seems unlikely post covid and note her saba helps only if she takes it after sits and rests, never pre or rechallenges using prior to exertion.   So off the highlighted dx's,  ACEi effects are the most likely here but certainly not  the only possibility so will start with trial off and have her return in 6 weeks to regroup.   Discussed in detail all the  indications, usual  risks and alternatives  relative to the benefits with patient who agrees to proceed with w/u as outlined.

## 2023-04-26 NOTE — Assessment & Plan Note (Signed)
Try off ACEi  04/25/2023 for unexplained doe / new cough   In the best review of chronic cough to date ( NEJM 2016 375 [id number]) ,  ACEi are now felt to cause cough in up to  20% of pts which is a 4 fold increase from previous reports and does not include the variety of non-specific complaints we see in pulmonary clinic in pts on ACEi but previously attributed to another dx like  Copd/asthma and  include PNDS, throat and chest congestion, "bronchitis", unexplained dyspnea and noct "strangling" sensations, and hoarseness, but also  atypical /refractory GERD symptoms like dysphagia and "bad heartburn"   The only way I know  to prove this is not an "ACEi Case" is a trial off ACEi x a minimum of 6 weeks then regroup.   >>> try benicar 20-12.5 x 6 weeks then return to regroup and see whether additional pulmonary eval is needed or not         Each maintenance medication was reviewed in detail including emphasizing most importantly the difference between maintenance and prns and under what circumstances the prns are to be triggered using an action plan format where appropriate.  Total time for H and P, chart review, counseling, reviewing hfa device(s) and generating customized AVS unique to this office visit / same day charting = 45 min new pt eval

## 2023-04-30 ENCOUNTER — Ambulatory Visit (INDEPENDENT_AMBULATORY_CARE_PROVIDER_SITE_OTHER): Payer: Medicare HMO | Admitting: Internal Medicine

## 2023-04-30 ENCOUNTER — Encounter: Payer: Self-pay | Admitting: Internal Medicine

## 2023-04-30 VITALS — BP 114/70 | HR 64 | Temp 98.0°F | Ht 69.0 in | Wt 192.9 lb

## 2023-04-30 DIAGNOSIS — K5904 Chronic idiopathic constipation: Secondary | ICD-10-CM

## 2023-04-30 DIAGNOSIS — R1319 Other dysphagia: Secondary | ICD-10-CM | POA: Diagnosis not present

## 2023-04-30 DIAGNOSIS — D126 Benign neoplasm of colon, unspecified: Secondary | ICD-10-CM

## 2023-04-30 DIAGNOSIS — K219 Gastro-esophageal reflux disease without esophagitis: Secondary | ICD-10-CM | POA: Diagnosis not present

## 2023-04-30 NOTE — Patient Instructions (Signed)
   I am happy to hear that you are doing well.  Continue on Carafate tablets as needed.    Continue on MiraLAX for your chronic constipation.   We will plan on colonoscopy 2027 given your history of polyps.   Follow-up in 6 months or sooner if needed.   It was very nice seeing you today.   Dr. Marletta Lor

## 2023-04-30 NOTE — Progress Notes (Signed)
Referring Provider: Kerri Perches, MD Primary Care Physician:  Kerri Perches, MD Primary GI:  Dr. Marletta Lor  Chief Complaint  Patient presents with   Gastroesophageal Reflux    Follow up on GERD. Taking carafate bid and reports it is doing well. Not having any trouble swallowing.     HPI:   Sophia [name] is a 72 y.o. adult who presents follow-up of GERD and dysphagia s/p EGD 02/16/2021 revealing benign-appearing esophageal stenosis s/p dilation, gastritis biopsied.  Biopsies were benign.    Colonoscopy also up-to-date 11/27/2020: Nonbleeding internal hemorrhoids, one 6 mm transverse colon polyp resected, diverticulosis in the sigmoid colon.  Pathology with tubular adenoma.  Repeat in 5 years.   Today: Dysphagia resolved.    GERD well controlled.  Takes Carafate as needed for breakthrough symptoms. No longer requiring Omeprazole. Denies abdominal pain, nausea, vomiting.   Bowels move well.  Occasionally, she will use MiraLAX, but nothing routine.  Denies BRBPR or melena.    Past Medical History:  Diagnosis Date   Acute respiratory disease due to COVID-19 virus 08/28/2019   Anxiety    Arthritis    Depression    Diabetes mellitus type 2 in obese 11/28/2010   Qualifier: Diagnosis of  By: Lodema Hong MD, Margaret  Diet controlled in 12/2016    Diabetes mellitus without complication (HCC)    GERD (gastroesophageal reflux disease)    Hypercholesteremia    Hypertension    PONV (postoperative nausea and vomiting)    Sleep apnea     Past Surgical History:  Procedure Laterality Date   ABDOMINAL HYSTERECTOMY  11/18/1980   tubes and womb, bleeding and ectopic   ABDOMINAL SURGERY     removal of tumors   BALLOON DILATION N/A 02/16/2021   Procedure: BALLOON DILATION;  Surgeon: Lanelle Bal, DO;  [facility];  Service: Endoscopy;  Laterality: N/A;   BIOPSY  02/16/2021   Procedure: BIOPSY;  Surgeon: Lanelle Bal, DO;  Location[facility];  Service:  Endoscopy;;   BREAST SURGERY Left 11/18/1981   benign tumor   CATARACT EXTRACTION W/PHACO Right 01/11/2014   Procedure: CATARACT EXTRACTION PHACO AND INTRAOCULAR LENS PLACEMENT (IOC);  Surgeon: Loraine Leriche T. Nile Riggs, MD;  Location: [facility];  Service: Ophthalmology;  Laterality: Right;  CDE 5.57   CATARACT EXTRACTION W/PHACO Left 01/25/2014   Procedure: CATARACT EXTRACTION PHACO AND INTRAOCULAR LENS PLACEMENT (IOC);  Surgeon: Loraine Leriche T. Nile Riggs, MD;  Location: [facility];  Service: Ophthalmology;  Laterality: Left;  CDE:5.19   COLONOSCOPY  2007 BRBPR   NL EXAM   COLONOSCOPY N/A 04/22/2014   Dr. Fields:Normal mucosa in the terminal ileum/Two COLON polyps REMOVED/ Mild diverticulosis in the ascending colon and transverse colon/The LEFT colon IS redundant/Small internal hemorrhoids. Path: tubular adenoma. Next colonoscopy in 5-10 years   COLONOSCOPY WITH PROPOFOL N/A 11/27/2020   Surgeon: Lanelle Bal, DO;  Nonbleeding internal hemorrhoids, one 6 mm transverse colon polyp resected, diverticulosis in the sigmoid colon.  Pathology with tubular adenoma.  Repeat in 5 years.   ESOPHAGOGASTRODUODENOSCOPY N/A 04/22/2014   Dr. Fields:MICROCYTIC ANEMIA MOST LIKELY DUE TO ASA/VOLTAREN/Small hiatal hernia/MODERATE Non-erosive gastritis. Negative H.pylori   ESOPHAGOGASTRODUODENOSCOPY (EGD) WITH PROPOFOL N/A 02/16/2021   Surgeon: Lanelle Bal, DO; benign-appearing esophageal stenosis s/p dilation, gastritis biopsied.  Biopsies were benign.   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 Nile Riggs, MD;  Location: [facility];  Service: Ophthalmology;  Laterality: Right;   YAG LASER APPLICATION Left 08/15/2015   Procedure: YAG LASER APPLICATION;  Surgeon: Jethro Bolus, MD;  Location: [facility];  Service: Ophthalmology;  Laterality: Left;    Current Outpatient Medications  Medication Sig Dispense Refill   acetaminophen (TYLENOL) 650 MG CR tablet Take 650 mg by mouth 2 (two) times daily.     albuterol (VENTOLIN HFA) 108 (90 Base) MCG/ACT inhaler Inhale 2 puffs into the lungs every 6 (six) hours as needed for wheezing or shortness of breath. 8 g 0   amLODipine (NORVASC) 10 MG tablet Take 1 tablet (10 mg total) by mouth daily. 90 tablet 3   aspirin EC 81 MG tablet Take 81 mg by mouth daily. Swallow whole.     buPROPion (WELLBUTRIN XL) 150 MG 24 hr tablet Take 1 tablet (150 mg total) by mouth daily. 90 tablet 3   busPIRone (BUSPAR) 10 MG tablet Take 1 tablet (10 mg total) by mouth 3 (three) times daily. 270 tablet 3   chlorthalidone (HYGROTON) 25 MG tablet TAKE 1 TABLET EVERY DAY 90 tablet 3   Cyanocobalamin (VITAMIN B-12 PO) Take 2 gummies daily.     ferrous gluconate (FERGON) 324 MG tablet Take 324 mg by mouth daily with breakfast.     FLUoxetine (PROZAC) 40 MG capsule Take 1 capsule (40 mg total) by mouth daily. 90 capsule 3   Multiple Vitamin (MULTIVITAMIN) tablet Take 1 tablet by mouth daily.     olmesartan-hydrochlorothiazide (BENICAR HCT) 20-12.5 MG tablet Take 1 tablet by mouth daily. 30 tablet 11   OVER THE COUNTER MEDICATION Black seed as needed     polyethylene glycol (MIRALAX / GLYCOLAX) 17 g packet Take 17 g by mouth daily as needed.     rosuvastatin (CRESTOR) 20 MG tablet TAKE 1 TABLET  EVERY DAY 90 tablet 3   sucralfate (CARAFATE) 1 g tablet Take 1 tablet (1 g total) by mouth 2 (two) times daily. 180 tablet 1   omeprazole (PRILOSEC) 40 MG capsule Take 1 capsule (40 mg total) by mouth 2 (two) times daily. Take one capsule by mouth two times daily for 2 month only (Patient not taking: Reported on 04/30/2023) 180 capsule 3   No current facility-administered medications for this visit.    Allergies as of 04/30/2023   (No Known Allergies)    Family History  Problem Relation Age of Onset   Colon polyps Sister 20   Diabetes Sister    Hypertension Sister    Kidney disease Sister    Arthritis Father    Cancer Father        prostate    Hypertension Father    Diabetes Sister    Hypertension Sister    CAD Paternal Grandmother    Colon cancer Neg Hx     Social History   Socioeconomic History   Marital status:  Married    Spouse name: Not on file   Number of children: Not on file   Years of education: Not on file   Highest education level: Not on file  Occupational History   Not on file  Tobacco Use   Smoking status: Former    Packs/day: 0.25    Years: 20.00    Additional pack years: 0.00    Total pack years: 5.00    Types: Cigarettes    Quit date: 01/05/1990    Years since quitting: 33.3   Smokeless tobacco: Former    Types: Snuff    Quit date: 03/28/2022   Tobacco comments:    Client said she no longer is using snuff. She uses marijuana occasionally per client  Vaping Use   Vaping Use: Never used  Substance and Sexual Activity   Alcohol use: No   Drug use: Yes    Types: Marijuana    Comment: occ marijuana   Sexual activity: Not Currently    Birth control/protection: Surgical  Other Topics Concern   Not on file  Social History Narrative   Not on file   Social Determinants of Health   Financial Resource Strain: Low Risk  (08/13/2022)   Overall Financial Resource Strain (CARDIA)    Difficulty of Paying Living Expenses: Not very hard  Food  Insecurity: No Food Insecurity (08/13/2022)   Hunger Vital Sign    Worried About Running Out of Food in the Last Year: Never true    Ran Out of Food in the Last Year: Never true  Transportation Needs: No Transportation Needs (08/13/2022)   PRAPARE - Administrator, Civil Service (Medical): No    Lack of Transportation (Non-Medical): No  Physical Activity: Insufficiently Active (08/13/2022)   Exercise Vital Sign    Days of Exercise per Week: 3 days    Minutes of Exercise per Session: 20 min  Stress: No Stress Concern Present (08/13/2022)   [employer] of Occupational Health - Occupational Stress Questionnaire    Feeling of Stress : Not at all  Recent Concern: Stress - Stress Concern Present (06/20/2022)   [employer] of Occupational Health - Occupational Stress Questionnaire    Feeling of Stress : To some extent  Social Connections: Moderately Integrated (08/13/2022)   Social Connection and Isolation Panel [NHANES]    Frequency of Communication with Friends and Family: More than three times a week    Frequency of Social Gatherings with Friends and Family: More than three times a week    Attends Religious Services: More than 4 times per year    Active Member of [employer] or Organizations: No    Attends Banker Meetings: Never    Marital Status: Married    Subjective: Review of Systems  Constitutional:  Negative for chills and fever.  HENT:  Negative for congestion and hearing loss.   Eyes:  Negative for blurred vision and double vision.  Respiratory:  Negative for cough and shortness of breath.   Cardiovascular:  Negative for chest pain and palpitations.  Gastrointestinal:  Positive for constipation and heartburn. Negative for abdominal pain, blood in stool, diarrhea, melena and vomiting.  Genitourinary:  Negative for dysuria and urgency.  Musculoskeletal:  Negative for joint pain and myalgias.  Skin:  Negative for itching and rash.  Neurological:   Negative for dizziness and headaches.  Psychiatric/Behavioral:  Negative for depression. The patient is not nervous/anxious.      Objective: BP 114/70 (BP Location: Right Arm, Patient  Position: Sitting, Cuff Size: Large)   Pulse 64   Temp 98 F (36.7 C) (Oral)   Ht 5\' 9"  (1.753 m)   Wt 192 lb 14.4 oz (87.5 kg)   BMI 28.49 kg/m  Physical Exam Constitutional:      Appearance: Normal appearance.  HENT:     Head: Normocephalic and atraumatic.  Eyes:     Extraocular Movements: Extraocular movements intact.     Conjunctiva/sclera: Conjunctivae normal.  Cardiovascular:     Rate and Rhythm: Normal rate and regular rhythm.  Pulmonary:     Effort: Pulmonary effort is normal.     Breath sounds: Normal breath sounds.  Abdominal:     General: Bowel sounds are normal.     Palpations: Abdomen is soft.  Musculoskeletal:        General: No swelling. Normal range of motion.     Cervical back: Normal range of motion and neck supple.  Skin:    General: Skin is warm and dry.     Coloration: Skin is not jaundiced.  Neurological:     General: No focal deficit present.     Mental Status: She is alert and oriented to person, place, and time.  Psychiatric:        Mood and Affect: Mood normal.        Behavior: Behavior normal.      Assessment: *Chronic GERD-well-controlled  *Chronic idiopathic constipation-well-controlled MiraLAX *Dysphagia-resolved status post dilation *Adenomatous colon polyps  Plan: Chronic GERD well-controlled Carafate as needed.  Continue on MiraLAX for chronic constipation.  Repeat colonoscopy 2027 for surveillance purposes.  Follow-up in 6 months or sooner if needed.  04/30/2023 8:45 AM   Disclaimer: This note was dictated with voice recognition software. Similar sounding words can inadvertently be transcribed and may not be corrected upon review.

## 2023-06-08 NOTE — Progress Notes (Signed)
Sophia [name]     DOB: Dec 03, 1950    MRN: [id number]   Brief patient profile:  9  yobf  quit smoking 1991  referred to pulmonary clinic in [place]  04/25/2023 by Dr Lodema Hong  for doe ever since 08/2019 COVID 19 gradually worse since  with neg cardiac eval last seen 11/15/22 (note says improving/ pt says progressive doe since covid)    History of Present Illness  04/25/2023  Pulmonary/ 1st office eval/ Sherene Sires / [facility] on ACEi  Chief Complaint  Patient presents with   Consult    Pt consult for SOB - states that is has been ongoing 1mo. PCP did provide albuterol inhaler that pt reports taking 1-3/day  Dyspnea:  food [employer] x 4 aisles/ used to do ex bike fatigue > sob so stopped  Cough: onset also  ever since breathing problems p covid >  uses H1 helps some but never needed previously  Sleep: level bed one pillow does fine  SABA use: now needing saba p ex - has never pretreated  02: none  Rec Stop the benazapril and start olmesartan 20-12.5 one daily in its place If blood pressure is too low, stop chlorthalidone  - if still too low, hold amlodipine   Cxr 04/25/23 wnl    06/09/2023  f/u ov/[place] office/Leahna Hewson re: sob ? Acei case? maint on no resp  rx and has no resp cc Chief Complaint  Patient presents with   DOE  Dyspnea:  Not limited by breathing from desired activities   Cough: none  Sleeping: level bed one pillow SABA use: none  02: none    No obvious day to day or daytime variability or assoc excess/ purulent sputum or mucus plugs or hemoptysis or cp or chest tightness, subjective wheeze or overt sinus or hb symptoms.   Sleeping  without nocturnal  or early am exacerbation  of respiratory  c/o's or need for noct saba. Also denies any obvious fluctuation of symptoms with weather or environmental changes or other aggravating or alleviating factors except as outlined above   No unusual exposure hx or h/o childhood pna/ asthma or knowledge of premature  birth.  Current Allergies, Complete Past Medical History, Past Surgical History, Family History, and Social History were reviewed in [employer] record.  ROS  The following are not active complaints unless bolded Hoarseness, sore throat, dysphagia, dental problems, itching, sneezing,  nasal congestion or discharge of excess mucus or purulent secretions, ear ache,   fever, chills, sweats, unintended wt loss or wt gain, classically pleuritic or exertional cp,  orthopnea pnd or arm/hand swelling  or leg swelling, presyncope, palpitations, abdominal pain, anorexia, nausea, vomiting, diarrhea  or change in bowel habits or change in bladder habits, change in stools or change in urine, dysuria, hematuria,  rash, arthralgias/ R hip pain, visual complaints, headache, numbness, weakness or ataxia or problems with walking/using cane or coordination,  change in mood or  memory.        Current Meds  Medication Sig   acetaminophen (TYLENOL) 650 MG CR tablet Take 650 mg by mouth 2 (two) times daily.   albuterol (VENTOLIN HFA) 108 (90 Base) MCG/ACT inhaler Inhale 2 puffs into the lungs every 6 (six) hours as needed for wheezing or shortness of breath.   amLODipine (NORVASC) 10 MG tablet Take 1 tablet (10 mg total) by mouth daily.   aspirin EC 81 MG tablet Take 81 mg by mouth daily. Swallow whole.  buPROPion (WELLBUTRIN XL) 150 MG 24 hr tablet Take 1 tablet (150 mg total) by mouth daily.   busPIRone (BUSPAR) 10 MG tablet Take 1 tablet (10 mg total) by mouth 3 (three) times daily.   chlorthalidone (HYGROTON) 25 MG tablet TAKE 1 TABLET EVERY DAY   Cyanocobalamin (VITAMIN B-12 PO) Take 2 gummies daily.   ferrous gluconate (FERGON) 324 MG tablet Take 324 mg by mouth daily with breakfast.   FLUoxetine (PROZAC) 40 MG capsule Take 1 capsule (40 mg total) by mouth daily.   Multiple Vitamin (MULTIVITAMIN) tablet Take 1 tablet by mouth daily.   olmesartan-hydrochlorothiazide (BENICAR HCT) 20-12.5  MG tablet Take 1 tablet by mouth daily.   omeprazole (PRILOSEC) 40 MG capsule Take 40 mg by mouth 2 (two) times daily.   OVER THE COUNTER MEDICATION Black seed as needed   polyethylene glycol (MIRALAX / GLYCOLAX) 17 g packet Take 17 g by mouth daily as needed.   rosuvastatin (CRESTOR) 20 MG tablet TAKE 1 TABLET EVERY DAY   sucralfate (CARAFATE) 1 g tablet Take 1 tablet (1 g total) by mouth 2 (two) times daily.                     Past Medical History:  Diagnosis Date   Acute respiratory disease due to COVID-19 virus 08/28/2019   Anxiety    Arthritis    Depression    Diabetes mellitus type 2 in obese 11/28/2010   Qualifier: Diagnosis of  By: Lodema Hong MD, Margaret  Diet controlled in 12/2016    Diabetes mellitus without complication (HCC)    GERD (gastroesophageal reflux disease)    Hypercholesteremia    Hypertension    PONV (postoperative nausea and vomiting)    Sleep apnea         Objective:     Wt Readings from Last 3 Encounters:  06/09/23 191 lb (86.6 kg)  04/30/23 192 lb 14.4 oz (87.5 kg)  04/25/23 192 lb 9.6 oz (87.4 kg)    Vital signs reviewed  06/09/2023  - Note at rest 02 sats  96% on RA    General appearance:    amb bf walks with limp   HEENT : Oropharynx  clear   / edentulous    NECK :  without  apparent JVD/ palpable Nodes/TM    LUNGS: no acc muscle use,  Nl contour chest which is clear to A and P bilaterally without cough on insp or exp maneuvers   CV:  RRR  no s3 or murmur or increase in P2, and no edema   ABD:  soft and nontender with nl inspiratory excursion in the supine position. No bruits or organomegaly appreciated   MS:  Nl gait/ ext warm without deformities Or obvious joint restrictions  calf tenderness, cyanosis or clubbing    SKIN: warm and dry without lesions    NEURO:  alert, approp, nl sensorium with  no motor or cerebellar deficits apparent.         Assessment

## 2023-06-09 ENCOUNTER — Ambulatory Visit: Payer: Medicare HMO | Admitting: Internal Medicine

## 2023-06-09 ENCOUNTER — Encounter: Payer: Self-pay | Admitting: Internal Medicine

## 2023-06-09 VITALS — BP 110/65 | HR 82 | Ht 69.0 in | Wt 191.0 lb

## 2023-06-09 DIAGNOSIS — R0609 Other forms of dyspnea: Secondary | ICD-10-CM

## 2023-06-09 DIAGNOSIS — I10 Essential (primary) hypertension: Secondary | ICD-10-CM

## 2023-06-09 NOTE — Assessment & Plan Note (Addendum)
Try off ACEi  04/25/2023 for unexplained doe / new cough > resolved 06/09/2023   Although even in retrospect it may not be clear the ACEi contributed to the pt's symptoms,  Pt improved off them and adding them back at this point or in the future would risk confusion in interpretation of non-specific respiratory symptoms to which this patient is prone  ie  Better not to muddy the waters here.   >>> continue olmesartan 20-12.5 one daily and f/u with Dr Lodema Hong   Each maintenance medication was reviewed in detail including emphasizing most importantly the difference between maintenance and prns and under what circumstances the prns are to be triggered using an action plan format where appropriate.  Total time for H and P, chart review, counseling,   and generating customized AVS unique to this office visit / same day charting = 22 min

## 2023-06-09 NOTE — Assessment & Plan Note (Addendum)
Onset 08/2019 with covid 19 and progressive since with assoc new PNDS/ cough > rec d/c acei  - 06/09/2023 reported 100% resolution off acei   Upper airway cough syndrome (previously labeled PNDS),  is so named because it's frequently impossible to sort out how much is  CR/sinusitis with freq throat clearing (which can be related to primary GERD)   vs  causing  secondary (" extra esophageal")  GERD from wide swings in gastric pressure that occur with throat clearing, often  promoting self use of mint and menthol lozenges that reduce the lower esophageal sphincter tone and exacerbate the problem further in a cyclical fashion.   These are the same pts (now being labeled as having "irritable larynx syndrome" by some cough centers) who not infrequently have a history of having failed to tolerate ace inhibitors,  dry powder inhalers or biphosphonates or report having atypical/extraesophageal reflux symptoms that don't respond to standard doses of PPI  and are easily confused as having aecopd or asthma flares by even experienced allergists/ pulmonologists (myself included).   Upper airway cough syndrome resolved off acei > no pulmonary f/u needed

## 2023-07-08 ENCOUNTER — Telehealth: Payer: Self-pay | Admitting: Family Medicine

## 2023-07-08 NOTE — Telephone Encounter (Signed)
Sophia [name] called from niece if provider can write patient a referral to ortho a lot of issues with gout  call back # [phone] or [phone]

## 2023-07-28 ENCOUNTER — Encounter (HOSPITAL_COMMUNITY): Payer: Self-pay

## 2023-07-28 ENCOUNTER — Ambulatory Visit (HOSPITAL_COMMUNITY)
Admission: RE | Admit: 2023-07-28 | Discharge: 2023-07-28 | Disposition: A | Discharge status: Home / Self Care | Payer: Medicare HMO | Source: Ambulatory Visit | Attending: Family Medicine | Admitting: Family Medicine

## 2023-07-28 DIAGNOSIS — Z1231 Encounter for screening mammogram for malignant neoplasm of breast: Secondary | ICD-10-CM | POA: Diagnosis not present

## 2023-08-13 ENCOUNTER — Ambulatory Visit: Payer: Medicare HMO | Admitting: Family Medicine

## 2023-08-19 ENCOUNTER — Other Ambulatory Visit: Payer: Self-pay | Admitting: Family Medicine

## 2023-08-20 ENCOUNTER — Ambulatory Visit: Payer: Medicare HMO | Admitting: Family Medicine

## 2023-09-17 DIAGNOSIS — R7303 Prediabetes: Secondary | ICD-10-CM | POA: Diagnosis not present

## 2023-09-18 LAB — HEMOGLOBIN A1C
Est. average glucose Bld gHb Est-mCnc: 137 mg/dL
Hgb A1c MFr Bld: 6.4 % — ABNORMAL HIGH (ref 4.8–5.6)

## 2023-09-18 LAB — BMP8+EGFR
BUN/Creatinine Ratio: 30 — ABNORMAL HIGH (ref 12–28)
BUN: 37 mg/dL — ABNORMAL HIGH (ref 8–27)
CO2: 26 mmol/L (ref 20–29)
Calcium: 9.9 mg/dL (ref 8.7–10.3)
Chloride: 103 mmol/L (ref 96–106)
Creatinine, Ser: 1.25 mg/dL — ABNORMAL HIGH (ref 0.57–1.00)
Glucose: 95 mg/dL (ref 70–99)
Potassium: 3.9 mmol/L (ref 3.5–5.2)
Sodium: 147 mmol/L — ABNORMAL HIGH (ref 134–144)
eGFR: 46 mL/min/{1.73_m2} — ABNORMAL LOW (ref >59)

## 2023-09-23 ENCOUNTER — Ambulatory Visit (INDEPENDENT_AMBULATORY_CARE_PROVIDER_SITE_OTHER): Payer: Medicare HMO | Admitting: Family Medicine

## 2023-09-23 ENCOUNTER — Encounter: Payer: Self-pay | Admitting: Family Medicine

## 2023-09-23 VITALS — BP 119/72 | HR 69 | Resp 16 | Ht 69.0 in | Wt 200.1 lb

## 2023-09-23 DIAGNOSIS — K219 Gastro-esophageal reflux disease without esophagitis: Secondary | ICD-10-CM | POA: Diagnosis not present

## 2023-09-23 DIAGNOSIS — F411 Generalized anxiety disorder: Secondary | ICD-10-CM

## 2023-09-23 DIAGNOSIS — I10 Essential (primary) hypertension: Secondary | ICD-10-CM

## 2023-09-23 DIAGNOSIS — F17228 Nicotine dependence, chewing tobacco, with other nicotine-induced disorders: Secondary | ICD-10-CM | POA: Diagnosis not present

## 2023-09-23 DIAGNOSIS — F324 Major depressive disorder, single episode, in partial remission: Secondary | ICD-10-CM | POA: Diagnosis not present

## 2023-09-23 NOTE — Patient Instructions (Addendum)
Annual exam mid to end March, call if you need me sooner  Ensure 64 ounces water daily, rept non fasting xchem 7 and EGFR first week in December  (Eight , 8 ounce bottles, or four 16 ounce bottles)  Please work on quitting snuff '    Fasting lipid, cmp and eGFR , HBA1C, cBC , tSH and vit d 3 to 5 days  befoore 2025 visit  It is important that you exercise regularly at least 30 minutes 5 times a week. If you develop chest pain, have severe difficulty breathing, or feel very tired, stop exercising immediately and seek medical attention   Thanks for choosing [facility], we consider it a privelige to serve you.    Marland Kitchen

## 2023-09-24 NOTE — Assessment & Plan Note (Signed)
Controlled, no change in medication  

## 2023-09-24 NOTE — Progress Notes (Signed)
   Sophia [name]     MRN: [id number]      DOB: April 25, 1951  Chief Complaint  Patient presents with   Hypertension    Follow up visit     HPI Sophia [name] is here for follow up and re-evaluation of chronic medical conditions, medication management and review of any available recent lab and radiology data.  Preventive health is updated, specifically  Cancer screening and Immunization.   Questions or concerns regarding consultations or procedures which the PT has had in the interim are  addressed. The PT denies any adverse reactions to current medications since the last visit.  There are no new concerns.  There are no specific complaints   ROS Denies recent fever or chills. Denies sinus pressure, nasal congestion, ear pain or sore throat. Denies chest congestion, productive cough or wheezing. Denies chest pains, palpitations and leg swelling Denies abdominal pain, nausea, vomiting,diarrhea or constipation.   Denies dysuria, frequency, hesitancy or incontinence. Denies joint pain, swelling and limitation in mobility. Denies headaches, seizures, numbness, or tingling. Denies depression, anxiety or insomnia. Denies skin break down or rash.   PE  BP 119/72   Pulse 69   Resp 16   Ht 5\' 9"  (1.753 m)   Wt 200 lb 1.9 oz (90.8 kg)   SpO2 93%   BMI 29.55 kg/m   Patient alert and oriented and in no cardiopulmonary distress.  HEENT: No facial asymmetry, EOMI,     Neck supple .  Chest: Clear to auscultation bilaterally.  CVS: S1, S2 no murmurs, no S3.Regular rate.  ABD: Soft non tender.   Ext: No edema  MS: Adequate ROM spine, shoulders, hips and knees.  Skin: Intact, no ulcerations or rash noted.  Psych: Good eye contact, normal affect. Memory intact not anxious or depressed appearing.  CNS: CN 2-12 intact, power,  normal throughout.no focal deficits noted.   Assessment & Plan  Depression, major, single episode, in partial remission (HCC) Controlled, no change in  medication   GAD (generalized anxiety disorder) Controlled, no change in medication   Nicotine dependence Asked:confirms currently dips snuff Assess: Unwilling to set a quit date, but is cutting back Advise: needs to QUIT to reduce risk of cancer, cardio and cerebrovascular disease Assist: counseled for 5 minutes and literature provided Arrange: follow up in 2 to 4 months   Essential hypertension Controlled, no change in medication DASH diet and commitment to daily physical activity for a minimum of 30 minutes discussed and encouraged, as a part of hypertension management. The importance of attaining a healthy weight is also discussed.     09/23/2023    2:11 PM 06/09/2023    1:55 PM 04/30/2023    8:38 AM 04/25/2023    2:33 PM 04/09/2023    3:20 PM 02/25/2023    3:56 PM 11/28/2022    3:05 PM  BP/Weight  Systolic BP 119 110 114 110 116 115 107  Diastolic BP 72 65 70 66 69 71 70  Wt. (Lbs) 200.12 191 192.9 192.6 192 179.08 173.6  BMI 29.55 kg/m2 28.21 kg/m2 28.49 kg/m2 28.44 kg/m2 28.35 kg/m2 26.45 kg/m2 25.64 kg/m2       Gastroesophageal reflux disease Controlled, no change in medication

## 2023-09-24 NOTE — Assessment & Plan Note (Signed)
Controlled, no change in medication DASH diet and commitment to daily physical activity for a minimum of 30 minutes discussed and encouraged, as a part of hypertension management. The importance of attaining a healthy weight is also discussed.     09/23/2023    2:11 PM 06/09/2023    1:55 PM 04/30/2023    8:38 AM 04/25/2023    2:33 PM 04/09/2023    3:20 PM 02/25/2023    3:56 PM 11/28/2022    3:05 PM  BP/Weight  Systolic BP 119 110 114 110 116 115 107  Diastolic BP 72 65 70 66 69 71 70  Wt. (Lbs) 200.12 191 192.9 192.6 192 179.08 173.6  BMI 29.55 kg/m2 28.21 kg/m2 28.49 kg/m2 28.44 kg/m2 28.35 kg/m2 26.45 kg/m2 25.64 kg/m2

## 2023-09-24 NOTE — Assessment & Plan Note (Signed)
Asked:confirms currently dips snuff °Assess: Unwilling to set a quit date, but is cutting back °Advise: needs to QUIT to reduce risk of cancer, cardio and cerebrovascular disease °Assist: counseled for 5 minutes and literature provided °Arrange: follow up in 2 to 4 months ° °

## 2023-10-20 DIAGNOSIS — I10 Essential (primary) hypertension: Secondary | ICD-10-CM | POA: Diagnosis not present

## 2023-10-21 LAB — BMP8+EGFR
BUN/Creatinine Ratio: 29 — ABNORMAL HIGH (ref 12–28)
BUN: 31 mg/dL — ABNORMAL HIGH (ref 8–27)
CO2: 26 mmol/L (ref 20–29)
Calcium: 9.9 mg/dL (ref 8.7–10.3)
Chloride: 101 mmol/L (ref 96–106)
Creatinine, Ser: 1.08 mg/dL — ABNORMAL HIGH (ref 0.57–1.00)
Glucose: 98 mg/dL (ref 70–99)
Potassium: 4 mmol/L (ref 3.5–5.2)
Sodium: 144 mmol/L (ref 134–144)
eGFR: 54 mL/min/{1.73_m2} — ABNORMAL LOW (ref >59)

## 2023-10-22 ENCOUNTER — Ambulatory Visit (INDEPENDENT_AMBULATORY_CARE_PROVIDER_SITE_OTHER): Payer: Medicare HMO

## 2023-10-22 VITALS — Ht 69.0 in | Wt 185.0 lb

## 2023-10-22 DIAGNOSIS — Z0000 Encounter for general adult medical examination without abnormal findings: Secondary | ICD-10-CM

## 2023-10-22 DIAGNOSIS — Z780 Asymptomatic menopausal state: Secondary | ICD-10-CM

## 2023-10-22 DIAGNOSIS — Z0100 Encounter for examination of eyes and vision without abnormal findings: Secondary | ICD-10-CM

## 2023-10-22 NOTE — Patient Instructions (Signed)
Sophia [name] , Thank you for taking time to come for your Medicare Wellness Visit. I appreciate your ongoing commitment to your health goals. Please review the following plan we discussed and let me know if I can assist you in the future.   Referrals/Orders/Follow-Ups/Clinician Recommendations:  Sophia [name] been referred to see Dr. Sinda Du for an eye exam. If you haven't heard from his office in about a week, please call to schedule your appointment.   Sinda Du, MD [address] [place] [address]  Phone: [phone]  Next Medicare Annual Wellness Visit: October 25, 2024 at 9:20 am virtual visit  This is a list of the screening recommended for you and due dates:  Health Maintenance  Topic Date Due   DEXA scan (bone density measurement)  11/26/2022   Eye exam for diabetics  03/26/2023   COVID-19 Vaccine (6 - 2023-24 season) 07/20/2023   Yearly kidney health urinalysis for diabetes  10/16/2027*   Complete foot exam   11/15/2023   Hemoglobin A1C  03/17/2024   DTaP/Tdap/Td vaccine (2 - Td or Tdap) 04/29/2024   Mammogram  07/27/2024   Yearly kidney function blood test for diabetes  10/19/2024   Medicare Annual Wellness Visit  10/21/2024   Colon Cancer Screening  11/27/2025   Pneumonia Vaccine  Completed   Flu Shot  Completed   Hepatitis C Screening  Completed   Zoster (Shingles) Vaccine  Completed   HPV Vaccine  Aged Out  *Topic was postponed. The date shown is not the original due date.    Advanced directives: (ACP Link)Information on Advanced Care Planning can be found at [facility] Advance Health Care Directives Advance Health Care Directives ([email or web address]   Next Medicare Annual Wellness Visit scheduled for next year: Yes  Preventive Care 65 Years and Older, Female Preventive care refers to lifestyle choices and visits with your health care provider that can promote health and wellness. Preventive care visits are also called wellness exams. What can  I expect for my preventive care visit? Counseling Your health care provider may ask you questions about your: Medical history, including: Past medical problems. Family medical history. Pregnancy and menstrual history. History of falls. Current health, including: Memory and ability to understand (cognition). Emotional well-being. Home life and relationship well-being. Sexual activity and sexual health. Lifestyle, including: Alcohol, nicotine or tobacco, and drug use. Access to firearms. Diet, exercise, and sleep habits. Work and work Astronomer. Sunscreen use. Safety issues such as seatbelt and bike helmet use. Physical exam Your health care provider will check your: Height and weight. These may be used to calculate your BMI (body mass index). BMI is a measurement that tells if you are at a healthy weight. Waist circumference. This measures the distance around your waistline. This measurement also tells if you are at a healthy weight and may help predict your risk of certain diseases, such as type 2 diabetes and high blood pressure. Heart rate and blood pressure. Body temperature. Skin for abnormal spots. What immunizations do I need?  Vaccines are usually given at various ages, according to a schedule. Your health care provider will recommend vaccines for you based on your age, medical history, and lifestyle or other factors, such as travel or where you work. What tests do I need? Screening Your health care provider may recommend screening tests for certain conditions. This may include: Lipid and cholesterol levels. Hepatitis C test. Hepatitis B test. HIV (human immunodeficiency virus) test. STI (sexually transmitted infection) testing,  if you are at risk. Lung cancer screening. Colorectal cancer screening. Diabetes screening. This is done by checking your blood sugar (glucose) after you have not eaten for a while (fasting). Mammogram. Talk with your health care provider  about how often you should have regular mammograms. BRCA-related cancer screening. This may be done if you have a family history of breast, ovarian, tubal, or peritoneal cancers. Bone density scan. This is done to screen for osteoporosis. Talk with your health care provider about your test results, treatment options, and if necessary, the need for more tests. Follow these instructions at home: Eating and drinking  Eat a diet that includes fresh fruits and vegetables, whole grains, lean protein, and low-fat dairy products. Limit your intake of foods with high amounts of sugar, saturated fats, and salt. Take vitamin and mineral supplements as recommended by your health care provider. Do not drink alcohol if your health care provider tells you not to drink. If you drink alcohol: Limit how much you have to 0-1 drink a day. Know how much alcohol is in your drink. In the [place] one drink equals one 12 oz bottle of beer (355 mL), one 5 oz glass of wine (148 mL), or one 1 oz glass of hard liquor (44 mL). Lifestyle Brush your teeth every morning and night with fluoride toothpaste. Floss one time each day. Exercise for at least 30 minutes 5 or more days each week. Do not use any products that contain nicotine or tobacco. These products include cigarettes, chewing tobacco, and vaping devices, such as e-cigarettes. If you need help quitting, ask your health care provider. Do not use drugs. If you are sexually active, practice safe sex. Use a condom or other form of protection in order to prevent STIs. Take aspirin only as told by your health care provider. Make sure that you understand how much to take and what form to take. Work with your health care provider to find out whether it is safe and beneficial for you to take aspirin daily. Ask your health care provider if you need to take a cholesterol-lowering medicine (statin). Find healthy ways to manage stress, such as: Meditation, yoga, or listening to  music. Journaling. Talking to a trusted person. Spending time with friends and family. Minimize exposure to UV radiation to reduce your risk of skin cancer. Safety Always wear your seat belt while driving or riding in a vehicle. Do not drive: If you have been drinking alcohol. Do not ride with someone who has been drinking. When you are tired or distracted. While texting. If you have been using any mind-altering substances or drugs. Wear a helmet and other protective equipment during sports activities. If you have firearms in your house, make sure you follow all gun safety procedures. What's next? Visit your health care provider once a year for an annual wellness visit. Ask your health care provider how often you should have your eyes and teeth checked. Stay up to date on all vaccines. This information is not intended to replace advice given to you by your health care provider. Make sure you discuss any questions you have with your health care provider. Document Revised: 05/02/2021 Document Reviewed: 05/02/2021 Elsevier Patient Education  2024 [employer]. Understanding Your Risk for Falls Millions of people have serious injuries from falls each year. It is important to understand your risk of falling. Talk with your health care provider about your risk and what you can do to lower it. If you do have a serious  fall, make sure to tell your provider. Falling once raises your risk of falling again. How can falls affect me? Serious injuries from falls are common. These include: Broken bones, such as hip fractures. Head injuries, such as traumatic brain injuries (TBI) or concussions. A fear of falling can cause you to avoid activities and stay at home. This can make your muscles weaker and raise your risk for a fall. What can increase my risk? There are a number of risk factors that increase your risk for falling. The more risk factors you have, the higher your risk of falling. Serious  injuries from a fall happen most often to people who are older than 73 years old. Teenagers and young adults ages 27-29 are also at higher risk. Common risk factors include: Weakness in the lower body. Being generally weak or confused due to long-term (chronic) illness. Dizziness or balance problems. Poor vision. Medicines that cause dizziness or drowsiness. These may include: Medicines for your blood pressure, heart, anxiety, insomnia, or swelling (edema). Pain medicines. Muscle relaxants. Other risk factors include: Drinking alcohol. Having had a fall in the past. Having foot pain or wearing improper footwear. Working at a dangerous job. Having any of the following in your home: Tripping hazards, such as floor clutter or loose rugs. Poor lighting. Pets. Having dementia or memory loss. What actions can I take to lower my risk of falling?     Physical activity Stay physically fit. Do strength and balance exercises. Consider taking a regular class to build strength and balance. Yoga and tai chi are good options. Vision Have your eyes checked every year and your prescription for glasses or contacts updated as needed. Shoes and walking aids Wear non-skid shoes. Wear shoes that have rubber soles and low heels. Do not wear high heels. Do not walk around the house in socks or slippers. Use a cane or walker as told by your provider. Home safety Attach secure railings on both sides of your stairs. Install grab bars for your bathtub, shower, and toilet. Use a non-skid mat in your bathtub or shower. Attach bath mats securely with double-sided, non-slip rug tape. Use good lighting in all rooms. Keep a flashlight near your bed. Make sure there is a clear path from your bed to the bathroom. Use night-lights. Do not use throw rugs. Make sure all carpeting is taped or tacked down securely. Remove all clutter from walkways and stairways, including extension cords. Repair uneven or broken  steps and floors. Avoid walking on icy or slippery surfaces. Walk on the grass instead of on icy or slick sidewalks. Use ice melter to get rid of ice on walkways in the winter. Use a cordless phone. Questions to ask your health care provider Can you help me check my risk for a fall? Do any of my medicines make me more likely to fall? Should I take a vitamin D supplement? What exercises can I do to improve my strength and balance? Should I make an appointment to have my vision checked? Do I need a bone density test to check for weak bones (osteoporosis)? Would it help to use a cane or a walker? Where to find more information Centers for Disease Control and Prevention, STEADI: TonerPromos.no Community-Based Fall Prevention Programs: TonerPromos.no [employer] on Aging: BaseRingTones.pl Contact a health care provider if: You fall at home. You are afraid of falling at home. You feel weak, drowsy, or dizzy. This information is not intended to replace advice given to you by your  health care provider. Make sure you discuss any questions you have with your health care provider. Document Revised: 07/08/2022 Document Reviewed: 07/08/2022 Elsevier Patient Education  2024 [employer].

## 2023-10-22 NOTE — Progress Notes (Signed)
Because this visit was a virtual/telehealth visit,  certain criteria was not obtained, such a blood pressure, CBG if applicable, and timed get up and go. Any medications not marked as "taking" were not mentioned during the medication reconciliation part of the visit. Any vitals not documented were not able to be obtained due to this being a telehealth visit or patient was unable to self-report a recent blood pressure reading due to a lack of equipment at home via telehealth. Vitals that have been documented are verbally provided by the patient.   Subjective:   Sophia [name] is a 72 y.o. female who presents for Medicare Annual (Subsequent) preventive examination.  Visit Complete: Virtual I connected with  Sophia [name] on 10/22/23 by a audio enabled telemedicine application and verified that I am speaking with the correct person using two identifiers.  Patient Location: Home  Provider Location: Home Office  I discussed the limitations of evaluation and management by telemedicine. The patient expressed understanding and agreed to proceed.  Vital Signs: Because this visit was a virtual/telehealth visit, some criteria may be missing or patient reported. Any vitals not documented were not able to be obtained and vitals that have been documented are patient reported.  Patient Medicare AWV questionnaire was completed by the patient on na; I have confirmed that all information answered by patient is correct and no changes since this date.  Cardiac Risk Factors include: advanced age (>16men, >33 women);diabetes mellitus;dyslipidemia;hypertension     Objective:    Today's Vitals   10/22/23 0947  Weight: 185 lb (83.9 kg)  Height: 5\' 9"  (1.753 m)   Body mass index is 27.32 kg/m.     10/22/2023    9:47 AM 08/13/2022    2:41 PM 08/11/2022    8:45 AM 07/25/2021    2:18 PM 02/16/2021    9:09 AM 11/27/2020    6:39 AM 07/17/2020   11:24 AM  Advanced Directives  Does Patient Have a Medical Advance  Directive? No No No No No No No  Would patient like information on creating a medical advance directive? No - Patient declined Yes (MAU/Ambulatory/Procedural Areas - Information given) No - Patient declined  Yes (MAU/Ambulatory/Procedural Areas - Information given) No - Patient declined Yes (ED - Information included in AVS)    Current Medications (verified) Outpatient Encounter Medications as of 10/22/2023  Medication Sig   acetaminophen (TYLENOL) 650 MG CR tablet Take 650 mg by mouth 2 (two) times daily.   albuterol (VENTOLIN HFA) 108 (90 Base) MCG/ACT inhaler Inhale 2 puffs into the lungs every 6 (six) hours as needed for wheezing or shortness of breath.   amLODipine (NORVASC) 10 MG tablet Take 1 tablet (10 mg total) by mouth daily.   aspirin EC 81 MG tablet Take 81 mg by mouth daily. Swallow whole.   busPIRone (BUSPAR) 10 MG tablet Take 1 tablet (10 mg total) by mouth 3 (three) times daily.   chlorthalidone (HYGROTON) 25 MG tablet TAKE 1 TABLET EVERY DAY   Cyanocobalamin (VITAMIN B-12 PO) Take 2 gummies daily.   ferrous gluconate (FERGON) 324 MG tablet Take 324 mg by mouth daily with breakfast.   FLUoxetine (PROZAC) 40 MG capsule Take 1 capsule (40 mg total) by mouth daily.   Melatonin 1 MG CAPS Takes two 2 mg gummies daily   Multiple Vitamin (MULTIVITAMIN) tablet Take 1 tablet by mouth daily.   olmesartan-hydrochlorothiazide (BENICAR HCT) 20-12.5 MG tablet Take 1 tablet by mouth daily.   omeprazole (PRILOSEC) 40 MG capsule  Take 40 mg by mouth 2 (two) times daily.   OVER THE COUNTER MEDICATION Black seed as needed   polyethylene glycol (MIRALAX / GLYCOLAX) 17 g packet Take 17 g by mouth daily as needed.   rosuvastatin (CRESTOR) 20 MG tablet TAKE 1 TABLET EVERY DAY   sucralfate (CARAFATE) 1 g tablet TAKE 1 TABLET TWICE DAILY   No facility-administered encounter medications on file as of 10/22/2023.    Allergies (verified) Patient has no known allergies.   History: Past Medical  History:  Diagnosis Date   Acute respiratory disease due to COVID-19 virus 08/28/2019   Anxiety    Arthritis    Depression    Diabetes mellitus type 2 in obese 11/28/2010   Qualifier: Diagnosis of  By: Lodema Hong MD, Margaret  Diet controlled in 12/2016    Diabetes mellitus without complication (HCC)    GERD (gastroesophageal reflux disease)    Hypercholesteremia    Hypertension    PONV (postoperative nausea and vomiting)    Sleep apnea    Past Surgical History:  Procedure Laterality Date   ABDOMINAL HYSTERECTOMY  11/18/1980   tubes and womb, bleeding and ectopic   ABDOMINAL SURGERY     removal of tumors   BALLOON DILATION N/A 02/16/2021   Procedure: BALLOON DILATION;  Surgeon: Lanelle Bal, DO;  Location: [facility];  Service: Endoscopy;  Laterality: N/A;   BIOPSY  02/16/2021   Procedure: BIOPSY;  Surgeon: Lanelle Bal, DO;  Location: [facility];  Service: Endoscopy;;   BREAST SURGERY Left 11/18/1981   benign tumor   CATARACT EXTRACTION W/PHACO Right 01/11/2014   Procedure: CATARACT EXTRACTION PHACO AND INTRAOCULAR LENS PLACEMENT (IOC);  Surgeon: Loraine Leriche T. Nile Riggs, MD;  Location: [facility];  Service: Ophthalmology;  Laterality: Right;  CDE 5.57   CATARACT EXTRACTION W/PHACO Left 01/25/2014   Procedure: CATARACT EXTRACTION PHACO AND INTRAOCULAR LENS PLACEMENT (IOC);  Surgeon: Loraine Leriche T. Nile Riggs, MD;  Location: [facility];  Service: Ophthalmology;  Laterality: Left;  CDE:5.19   COLONOSCOPY  2007 [facility]   NL EXAM   COLONOSCOPY N/A 04/22/2014   Dr. Fields:Normal mucosa in the terminal ileum/Two COLON polyps REMOVED/ Mild diverticulosis in the ascending colon and transverse colon/The LEFT colon IS redundant/Small internal hemorrhoids. Path: tubular adenoma. Next colonoscopy in 5-10 years   COLONOSCOPY WITH PROPOFOL N/A 11/27/2020   Surgeon: Lanelle Bal, DO;  Nonbleeding internal hemorrhoids, one 6 mm transverse colon polyp resected, diverticulosis in the sigmoid colon.  Pathology  with tubular adenoma.  Repeat in 5 years.   ESOPHAGOGASTRODUODENOSCOPY N/A 04/22/2014   Dr. Fields:MICROCYTIC ANEMIA MOST LIKELY DUE TO ASA/VOLTAREN/Small hiatal hernia/MODERATE Non-erosive gastritis. Negative H.pylori   ESOPHAGOGASTRODUODENOSCOPY (EGD) WITH PROPOFOL N/A 02/16/2021   Surgeon: Lanelle Bal, DO; benign-appearing esophageal stenosis s/p dilation, gastritis biopsied.  Biopsies were benign.   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 Nile Riggs, MD;  Location: [facility];  Service: Ophthalmology;  Laterality: Right;   YAG LASER APPLICATION Left 08/15/2015   Procedure: YAG LASER APPLICATION;  Surgeon: Jethro Bolus, MD;  Location: [facility];  Service: Ophthalmology;  Laterality: Left;   Family History  Problem Relation Age of Onset   Colon polyps Sister 41   Diabetes Sister    Hypertension Sister    Kidney disease Sister    Arthritis Father    Cancer Father        prostate    Hypertension Father    Diabetes Sister    Hypertension Sister    CAD Paternal Grandmother    Colon cancer Neg Hx    Social History   Socioeconomic History   Marital status: Married    Spouse name: Not on file   Number of children: Not on file   Years of education: Not on file   Highest  education level: Not on file  Occupational History   Not on file  Tobacco Use   Smoking status: Former    Current packs/day: 0.00    Average packs/day: 0.3 packs/day for 20.0 years (5.0 ttl pk-yrs)    Types: Cigarettes    Start date: 01/05/1970    Quit date: 01/05/1990    Years since quitting: 33.8   Smokeless tobacco: Former    Types: Snuff    Quit date: 03/28/2022   Tobacco comments:    Client said she no longer is using snuff. She uses marijuana occasionally per client  Vaping Use   Vaping status: Never Used  Substance and Sexual Activity   Alcohol use: No   Drug use: Yes    Types: Marijuana    Comment: occ marijuana   Sexual activity: Not Currently    Birth control/protection: Surgical  Other Topics Concern   Not on file  Social History Narrative   Not on file   Social Determinants of Health   Financial Resource Strain: Low Risk  (10/22/2023)   Overall Financial Resource Strain (CARDIA)    Difficulty of Paying Living Expenses: Not hard at all  Food Insecurity: No Food Insecurity (10/22/2023)   Hunger Vital Sign    Worried About Running Out of Food in the Last Year: Never true    Ran Out of Food in the Last Year: Never true  Transportation Needs: No Transportation Needs (10/22/2023)   PRAPARE - Administrator, Civil Service (Medical): No    Lack of Transportation (Non-Medical): No  Physical Activity: Insufficiently Active (10/22/2023)   Exercise Vital Sign    Days of Exercise per Week: 3 days    Minutes of Exercise per Session: 30 min  Stress: No Stress Concern Present (10/22/2023)   [employer] of Occupational Health - Occupational Stress Questionnaire    Feeling of Stress : Not at all  Social Connections: Moderately Integrated (10/22/2023)   Social Connection and Isolation Panel [NHANES]    Frequency of Communication with Friends and Family: More than three times a week    Frequency of Social Gatherings with Friends and Family: More than three times a  week    Attends Religious Services: More than 4 times per year    Active Member of [employer] or Organizations: No    Attends Banker Meetings: Never    Marital Status: Married    Tobacco Counseling Counseling given: Yes Tobacco comments: Client said she no longer is  using snuff. She uses marijuana occasionally per client   Clinical Intake:  Pre-visit preparation completed: Yes  Pain : No/denies pain     Nutritional Risks: None Diabetes: No  How often do you need to have someone help you when you read instructions, pamphlets, or other written materials from your doctor or pharmacy?: 1 - Never  Interpreter Needed?: No  Information entered by :: Abby Rhealynn Myhre, CMA   Activities of Daily Living    10/22/2023    9:53 AM  In your present state of health, do you have any difficulty performing the following activities:  Hearing? 0  Vision? 0  Difficulty concentrating or making decisions? 0  Walking or climbing stairs? 0  Dressing or bathing? 0  Doing errands, shopping? 0  Preparing Food and eating ? N  Using the Toilet? N  In the past six months, have you accidently leaked urine? N  Do you have problems with loss of bowel control? N  Managing your Medications? N  Managing your Finances? N  Housekeeping or managing your Housekeeping? N    Patient Care Team: Kerri Perches, MD as PCP - General (Family Medicine) Wendall Stade, MD as PCP - Cardiology (Cardiology) Jethro Bolus, MD as Consulting Physician (Ophthalmology) Vickki Hearing, MD as Consulting Physician (Orthopedic Surgery) Lanelle Bal, DO as Consulting Physician (Internal Medicine)  Indicate any recent Medical Services you may have received from other than [facility] providers in the past year (date may be approximate).     Assessment:   This is a routine wellness examination for Sophia [name].  Hearing/Vision screen Hearing Screening - Comments:: Patient denies any hearing difficulties.    Vision Screening - Comments:: Previous eye doctor retired. Referral placed for patient to establish with a new eye care provider.    Goals Addressed             This Visit's Progress    Patient Stated       To go back to work        Depression Screen    10/22/2023    9:57 AM 09/23/2023    2:46 PM 09/23/2023    2:14 PM 04/09/2023    3:23 PM 02/25/2023    3:58 PM 11/14/2022    8:54 AM 10/15/2022    3:06 PM  PHQ 2/9 Scores  PHQ - 2 Score 0 0  0 4 0 0  PHQ- 9 Score    2 13    Exception Documentation   Patient refusal        Fall Risk    10/22/2023   10:09 AM 09/23/2023    2:13 PM 04/09/2023    3:23 PM 02/25/2023    3:58 PM 11/14/2022    8:54 AM  Fall Risk   Falls in the past year? 0 0 0 0 0  Number falls in past yr: 0 0 0 0 0  Injury with Fall? 0 0 0 0 0  Risk for fall due to : No Fall Risks  No Fall Risks No Fall Risks No Fall Risks  Follow up Falls prevention discussed  Falls evaluation completed Falls evaluation completed Falls evaluation completed    MEDICARE RISK AT HOME: Medicare Risk at Home Any stairs in or around the home?: No If so, are there any without handrails?: No Home free of loose throw rugs in walkways, pet beds, electrical cords, etc?: Yes Adequate lighting in your home to reduce risk of falls?: Yes Life alert?: No Use of a  cane, walker or w/c?: No Grab bars in the bathroom?: No Shower chair or bench in shower?: No Elevated toilet seat or a handicapped toilet?: No  TIMED UP AND GO:  Was the test performed?  No    Cognitive Function:        10/22/2023    9:53 AM 08/13/2022    2:38 PM 07/25/2021    2:22 PM 07/17/2020   11:26 AM 07/14/2019    3:00 PM  6CIT Screen  What Year? 0 points 0 points 0 points 0 points 0 points  What month? 0 points 0 points 0 points 0 points 0 points  What time? 0 points 0 points 0 points  0 points  Count back from 20 0 points 2 points 0 points 4 points 0 points  Months in reverse 0 points 2 points 0 points  4  points  Repeat phrase 0 points 4 points 10 points  2 points  Total Score 0 points 8 points 10 points  6 points    Immunizations Immunization History  Administered Date(s) Administered   Fluad Quad(high Dose 65+) 08/16/2019, 08/04/2020, 07/17/2021   Influenza Split 08/19/2014   Influenza Whole 09/20/1996   Influenza,inj,Quad PF,6+ Mos 09/15/2015, 08/06/2016, 07/06/2018   Influenza-Unspecified 08/08/2022, 09/02/2023   Moderna SARS-COV2 Booster Vaccination 06/27/2021, 03/29/2022   Moderna Sars-Covid-2 Vaccination 01/12/2020, 02/09/2020, 09/18/2020   Pneumococcal Conjugate-13 01/25/2015   Pneumococcal Polysaccharide-23 11/28/2010, 03/25/2016   Tdap 04/29/2014   Zoster Recombinant(Shingrix) 03/29/2022, 06/03/2022   Zoster, Live 11/28/2010    TDAP status: Up to date  Flu Vaccine status: Up to date  Pneumococcal vaccine status: Up to date  Covid-19 vaccine status: Information provided on how to obtain vaccines.   Qualifies for Shingles Vaccine? Yes   Zostavax completed Yes   Shingrix Completed?: Yes  Screening Tests Health Maintenance  Topic Date Due   OPHTHALMOLOGY EXAM  03/26/2023   COVID-19 Vaccine (6 - 2023-24 season) 07/20/2023   Medicare Annual Wellness (AWV)  08/14/2023   Diabetic kidney evaluation - Urine ACR  10/16/2027 (Originally 06/08/2021)   FOOT EXAM  11/15/2023   HEMOGLOBIN A1C  03/17/2024   DTaP/Tdap/Td (2 - Td or Tdap) 04/29/2024   MAMMOGRAM  07/27/2024   Diabetic kidney evaluation - eGFR measurement  10/19/2024   Colonoscopy  11/27/2025   Pneumonia Vaccine 47+ Years old  Completed   INFLUENZA VACCINE  Completed   DEXA SCAN  Completed   Hepatitis C Screening  Completed   Zoster Vaccines- Shingrix  Completed   HPV VACCINES  Aged Out    Health Maintenance  Health Maintenance Due  Topic Date Due   OPHTHALMOLOGY EXAM  03/26/2023   COVID-19 Vaccine (6 - 2023-24 season) 07/20/2023   Medicare Annual Wellness (AWV)  08/14/2023    Colorectal cancer  screening: Type of screening: Colonoscopy. Completed 11/27/2020. Repeat every 5 years  Mammogram status: Completed 07/28/2023. Repeat every year  Bone Density status: Ordered 10/22/2023. Pt provided with contact info and advised to call to schedule appt.  Lung Cancer Screening: (Low Dose CT Chest recommended if Age 31-80 years, 20 pack-year currently smoking OR have quit w/in 15years.) does not qualify.   Lung Cancer Screening Referral: na  Additional Screening:  Hepatitis C Screening: does not qualify; Completed   Vision Screening: Recommended annual ophthalmology exams for early detection of glaucoma and other disorders of the eye. Is the patient up to date with their annual eye exam?  No  Who is the provider or what is the name of the  office in which the patient attends annual eye exams? Referral placed today. Patients previous eye doctor retired If pt is not established with a provider, would they like to be referred to a provider to establish care? Yes .   Dental Screening: Recommended annual dental exams for proper oral hygiene  Diabetic Foot Exam: Diabetic Foot Exam: Completed 11/14/2022  Community Resource Referral / Chronic Care Management: CRR required this visit?  No   CCM required this visit?  No     Plan:     I have personally reviewed and noted the following in the patient's chart:   Medical and social history Use of alcohol, tobacco or illicit drugs  Current medications and supplements including opioid prescriptions. Patient is not currently taking opioid prescriptions. Functional ability and status Nutritional status Physical activity Advanced directives List of other physicians Hospitalizations, surgeries, and ER visits in previous 12 months Vitals Screenings to include cognitive, depression, and falls Referrals and appointments  In addition, I have reviewed and discussed with patient certain preventive protocols, quality metrics, and best practice  recommendations. A written personalized care plan for preventive services as well as general preventive health recommendations were provided to patient.     Jordan Hawks Ameya Kutz, CMA   10/22/2023   After Visit Summary: (Mail) Due to this being a telephonic visit, the after visit summary with patients personalized plan was offered to patient via mail   Nurse Notes: see routing comment

## 2023-10-30 ENCOUNTER — Ambulatory Visit: Payer: Medicare HMO | Admitting: Internal Medicine

## 2023-10-30 ENCOUNTER — Telehealth: Payer: Self-pay | Admitting: *Deleted

## 2023-10-30 ENCOUNTER — Encounter: Payer: Self-pay | Admitting: Internal Medicine

## 2023-10-30 ENCOUNTER — Other Ambulatory Visit: Payer: Self-pay | Admitting: *Deleted

## 2023-10-30 VITALS — BP 118/71 | HR 54 | Temp 98.2°F | Ht 69.0 in | Wt 202.2 lb

## 2023-10-30 DIAGNOSIS — K838 Other specified diseases of biliary tract: Secondary | ICD-10-CM

## 2023-10-30 DIAGNOSIS — K5904 Chronic idiopathic constipation: Secondary | ICD-10-CM

## 2023-10-30 DIAGNOSIS — K219 Gastro-esophageal reflux disease without esophagitis: Secondary | ICD-10-CM | POA: Diagnosis not present

## 2023-10-30 DIAGNOSIS — K5909 Other constipation: Secondary | ICD-10-CM

## 2023-10-30 DIAGNOSIS — Z860101 Personal history of adenomatous and serrated colon polyps: Secondary | ICD-10-CM | POA: Diagnosis not present

## 2023-10-30 DIAGNOSIS — D126 Benign neoplasm of colon, unspecified: Secondary | ICD-10-CM

## 2023-10-30 NOTE — Progress Notes (Signed)
Referring Provider: Kerri Perches, MD Primary Care Physician:  Kerri Perches, MD Primary GI:  Dr. Marletta Lor  Chief Complaint  Patient presents with   Follow-up    Follow up on GERD, pt is better    HPI:   Sophia [name] is a 72 y.o. adult who presents follow-up of GERD and dysphagia s/p EGD 02/16/2021 revealing benign-appearing esophageal stenosis s/p dilation, gastritis biopsied.  Biopsies were benign.    Colonoscopy  11/27/2020: Nonbleeding internal hemorrhoids, one 6 mm transverse colon polyp resected, diverticulosis in the sigmoid colon.  Pathology with tubular adenoma.  Repeat in 5 years.   Today: Dysphagia resolved.    GERD well controlled.  Takes Carafate as needed for breakthrough symptoms. No longer requiring Omeprazole. Denies abdominal pain, nausea, vomiting.   Bowels move well.  Occasionally, she will use MiraLAX, but nothing routine.  Denies BRBPR or melena.    She did have an ultrasound December 2023 which showed dilated common bile duct.  Similar findings going back to 2020.  LFTs are completely normal.  Past Medical History:  Diagnosis Date   Acute respiratory disease due to COVID-19 virus 08/28/2019   Anxiety    Arthritis    Depression    Diabetes mellitus type 2 in obese 11/28/2010   Qualifier: Diagnosis of  By: Lodema Hong MD, Margaret  Diet controlled in 12/2016    Diabetes mellitus without complication (HCC)    GERD (gastroesophageal reflux disease)    Hypercholesteremia    Hypertension    PONV (postoperative nausea and vomiting)    Sleep apnea     Past Surgical History:  Procedure Laterality Date   ABDOMINAL HYSTERECTOMY  11/18/1980   tubes and womb, bleeding and ectopic   ABDOMINAL SURGERY     removal of tumors   BALLOON DILATION N/A 02/16/2021   Procedure: BALLOON DILATION;  Surgeon: Lanelle Bal, DO;  Location: [facility];  Service: Endoscopy;  Laterality: N/A;   BIOPSY  02/16/2021   Procedure: BIOPSY;  Surgeon: Lanelle Bal, DO;  Location: [facility];  Service: Endoscopy;;   BREAST SURGERY Left 11/18/1981   benign tumor   CATARACT EXTRACTION W/PHACO Right 01/11/2014   Procedure: CATARACT EXTRACTION PHACO AND INTRAOCULAR LENS PLACEMENT (IOC);  Surgeon: Loraine Leriche T. Nile Riggs, MD;  Location: [facility];  Service: Ophthalmology;  Laterality: Right;  CDE 5.57   CATARACT EXTRACTION W/PHACO Left 01/25/2014   Procedure: CATARACT EXTRACTION PHACO AND INTRAOCULAR LENS PLACEMENT (IOC);  Surgeon: Loraine Leriche T. Nile Riggs, MD;  Location: [facility];  Service: Ophthalmology;  Laterality: Left;  CDE:5.19   COLONOSCOPY  2007 BRBPR   NL EXAM   COLONOSCOPY N/A 04/22/2014   Dr. Fields:Normal mucosa in the terminal ileum/Two COLON polyps REMOVED/ Mild diverticulosis in the ascending colon and transverse colon/The LEFT colon IS redundant/Small internal hemorrhoids. Path: tubular adenoma. Next colonoscopy in 5-10 years   COLONOSCOPY WITH PROPOFOL N/A 11/27/2020   Surgeon: Lanelle Bal, DO;  Nonbleeding internal hemorrhoids, one 6 mm transverse colon polyp resected, diverticulosis in the sigmoid colon.  Pathology with tubular adenoma.  Repeat in 5 years.   ESOPHAGOGASTRODUODENOSCOPY N/A 04/22/2014   Dr. Fields:MICROCYTIC ANEMIA MOST LIKELY DUE TO ASA/VOLTAREN/Small hiatal hernia/MODERATE Non-erosive gastritis. Negative H.pylori   ESOPHAGOGASTRODUODENOSCOPY (EGD) WITH PROPOFOL N/A 02/16/2021   Surgeon: Lanelle Bal, DO; benign-appearing esophageal stenosis s/p dilation, gastritis biopsied.  Biopsies were benign.   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 Nile Riggs, MD;  Location: [facility];  Service: Ophthalmology;  Laterality: Right;   YAG LASER APPLICATION Left 08/15/2015   Procedure: YAG LASER APPLICATION;  Surgeon: Jethro Bolus, MD;  Location: [facility];  Service: Ophthalmology;  Laterality: Left;    Current Outpatient Medications  Medication Sig Dispense Refill   acetaminophen (TYLENOL) 650 MG CR tablet Take 650 mg by mouth 2 (two) times daily.     albuterol (VENTOLIN HFA) 108 (90 Base) MCG/ACT inhaler Inhale 2 puffs into the lungs every 6 (six) hours as needed for wheezing or shortness of breath. 8 g 0   amLODipine (NORVASC) 10 MG tablet Take 1 tablet (10 mg total) by mouth daily. 90 tablet 3   aspirin EC 81 MG tablet Take 81 mg by mouth daily. Swallow whole.     busPIRone (BUSPAR) 10 MG tablet Take 1 tablet (10 mg total) by mouth 3 (three) times daily. 270 tablet 3   chlorthalidone (HYGROTON) 25 MG tablet TAKE 1 TABLET EVERY DAY 90 tablet 3   Cyanocobalamin (VITAMIN B-12 PO) Take 2 gummies daily.     ferrous gluconate (FERGON) 324 MG tablet Take 324 mg by mouth daily with breakfast.     FLUoxetine (PROZAC) 40 MG capsule Take 1 capsule (40 mg total) by mouth daily. 90 capsule 3   Melatonin 1 MG CAPS Takes two 2 mg gummies daily     Multiple Vitamin (MULTIVITAMIN) tablet Take 1 tablet by mouth daily.     olmesartan-hydrochlorothiazide (BENICAR HCT) 20-12.5 MG tablet Take 1 tablet by mouth daily. 30 tablet 11   omeprazole (PRILOSEC) 40 MG capsule Take 40 mg by mouth 2 (two) times daily.     OVER THE COUNTER MEDICATION Black seed as needed     polyethylene glycol (MIRALAX / GLYCOLAX) 17 g packet Take 17 g by mouth  daily as needed.     rosuvastatin (CRESTOR) 20 MG tablet TAKE 1 TABLET EVERY DAY 90 tablet 3   sucralfate (CARAFATE) 1 g tablet TAKE 1 TABLET TWICE DAILY 180 tablet 3   No current facility-administered medications for this visit.    Allergies as of 10/30/2023   (No Known Allergies)    Family History  Problem Relation Age of Onset   Colon polyps Sister 71   Diabetes Sister    Hypertension Sister    Kidney disease Sister    Arthritis Father    Cancer Father        prostate    Hypertension Father    Diabetes Sister    Hypertension Sister    CAD Paternal Grandmother    Colon cancer Neg Hx     Social History   Socioeconomic History   Marital status: Married    Spouse name: Not on file   Number of children: Not on file   Years of education: Not on file  Highest education level: Not on file  Occupational History   Not on file  Tobacco Use   Smoking status: Former    Current packs/day: 0.00    Average packs/day: 0.3 packs/day for 20.0 years (5.0 ttl pk-yrs)    Types: Cigarettes    Start date: 01/05/1970    Quit date: 01/05/1990    Years since quitting: 33.8   Smokeless tobacco: Former    Types: Snuff    Quit date: 03/28/2022   Tobacco comments:    Client said she no longer is using snuff. She uses marijuana occasionally per client  Vaping Use   Vaping status: Never Used  Substance and Sexual Activity   Alcohol use: No   Drug use: Yes    Types: Marijuana    Comment: occ marijuana   Sexual activity: Not Currently    Birth control/protection: Surgical  Other Topics Concern   Not on file  Social History Narrative   Not on file   Social Drivers of Health   Financial Resource Strain: Low Risk  (10/22/2023)   Overall Financial Resource Strain (CARDIA)    Difficulty of Paying Living Expenses: Not hard at all  Food Insecurity: No Food Insecurity (10/22/2023)   Hunger Vital Sign    Worried About Running Out of Food in the Last Year: Never true    Ran Out of Food in  the Last Year: Never true  Transportation Needs: No Transportation Needs (10/22/2023)   PRAPARE - Administrator, Civil Service (Medical): No    Lack of Transportation (Non-Medical): No  Physical Activity: Insufficiently Active (10/22/2023)   Exercise Vital Sign    Days of Exercise per Week: 3 days    Minutes of Exercise per Session: 30 min  Stress: No Stress Concern Present (10/22/2023)   Sophia [name]    Feeling of Stress : Not at all  Social Connections: Moderately Integrated (10/22/2023)   Social Connection and Isolation Panel [NHANES]    Frequency of Communication with Friends and Family: More than three times a week    Frequency of Social Gatherings with Friends and Family: More than three times a week    Attends Religious Services: More than 4 times per year    Active Member of [employer] or Organizations: No    Attends Banker Meetings: Never    Marital Status: Married    Subjective: Review of Systems  Constitutional:  Negative for chills and fever.  HENT:  Negative for congestion and hearing loss.   Eyes:  Negative for blurred vision and double vision.  Respiratory:  Negative for cough and shortness of breath.   Cardiovascular:  Negative for chest pain and palpitations.  Gastrointestinal:  Positive for constipation and heartburn. Negative for abdominal pain, blood in stool, diarrhea, melena and vomiting.  Genitourinary:  Negative for dysuria and urgency.  Musculoskeletal:  Negative for joint pain and myalgias.  Skin:  Negative for itching and rash.  Neurological:  Negative for dizziness and headaches.  Psychiatric/Behavioral:  Negative for depression. The patient is not nervous/anxious.      Objective: BP 118/71   Pulse (!) 54   Temp 98.2 F (36.8 C)   Ht 5\' 9"  (1.753 m)   Wt 202 lb 3.2 oz (91.7 kg)   BMI 29.86 kg/m  Physical Exam Constitutional:      Appearance: Normal appearance.   HENT:     Head: Normocephalic and atraumatic.  Eyes:  Extraocular Movements: Extraocular movements intact.     Conjunctiva/sclera: Conjunctivae normal.  Cardiovascular:     Rate and Rhythm: Normal rate and regular rhythm.  Pulmonary:     Effort: Pulmonary effort is normal.     Breath sounds: Normal breath sounds.  Abdominal:     General: Bowel sounds are normal.     Palpations: Abdomen is soft.  Musculoskeletal:        General: No swelling. Normal range of motion.     Cervical back: Normal range of motion and neck supple.  Skin:    General: Skin is warm and dry.     Coloration: Skin is not jaundiced.  Neurological:     General: No focal deficit present.     Mental Status: She is alert and oriented to person, place, and time.  Psychiatric:        Mood and Affect: Mood normal.        Behavior: Behavior normal.      Assessment/Plan: 1.  Chronic GERD well-controlled Carafate as needed.  Continue  2.  Chronic constipation-well-controlled on MiraLAX.  Will continue continue on MiraLAX for chronic constipation.  3.  Adenomatous colon polyp-repeat colonoscopy 2027 for surveillance purposes.  4.  Dilated common bile duct-discussed in depth with patient today.  Findings similar going back to 2020.  LFTs have been normal.  Discussed MRI/MRCP to further evaluate and patient would like to proceed.  We will schedule today.  Call with results   Follow-up in 1 year or sooner if needed  10/30/2023 10:47 AM   Disclaimer: This note was dictated with voice recognition software. Similar sounding words can inadvertently be transcribed and may not be corrected upon review.

## 2023-10-30 NOTE — Telephone Encounter (Signed)
[employer] PA: Approved Authorization #[id number]  Tracking #[id number] Dates of service 10/31/2023 - 12/30/2023

## 2023-10-30 NOTE — Telephone Encounter (Signed)
Pt informed of MRI/MRCP appointment date,time,location and instructions. Verbalized understanding.

## 2023-10-30 NOTE — Patient Instructions (Addendum)
I am happy to hear that you are doing well.  Continue on Carafate for your acid reflux.  Continue MiraLAX for your constipation.    I am going to order an MRI of your liver to further evaluate duct dilatation.  We will call with results.  Follow-up with me in 1 year or sooner if needed.  Hope you have a fantastic Christmas.  Dr. Marletta Lor

## 2023-10-30 NOTE — Telephone Encounter (Signed)
LM with family member to have pt call back  MRI/MRCP scheduled for Monday,11/03/23, arrive at 3:45 pm, NPO 4 hours prior

## 2023-11-03 ENCOUNTER — Ambulatory Visit (HOSPITAL_COMMUNITY)
Admission: RE | Admit: 2023-11-03 | Discharge: 2023-11-03 | Disposition: A | Discharge status: Home / Self Care | Payer: Medicare HMO | Source: Ambulatory Visit | Attending: Internal Medicine | Admitting: Internal Medicine

## 2023-11-03 ENCOUNTER — Encounter (HOSPITAL_COMMUNITY): Payer: Self-pay | Admitting: Radiology

## 2023-11-03 DIAGNOSIS — K838 Other specified diseases of biliary tract: Secondary | ICD-10-CM | POA: Diagnosis not present

## 2023-11-03 DIAGNOSIS — N281 Cyst of kidney, acquired: Secondary | ICD-10-CM | POA: Diagnosis not present

## 2023-11-03 DIAGNOSIS — I700 Atherosclerosis of aorta: Secondary | ICD-10-CM | POA: Diagnosis not present

## 2023-11-03 DIAGNOSIS — K5710 Diverticulosis of small intestine without perforation or abscess without bleeding: Secondary | ICD-10-CM | POA: Diagnosis not present

## 2023-11-03 MED ORDER — GADOBUTROL 1 MMOL/ML IV SOLN
9.0000 mL | Freq: Once | INTRAVENOUS | Status: AC | PRN
  Administered 2023-11-03: 9 mL via INTRAVENOUS

## 2023-12-02 ENCOUNTER — Telehealth: Payer: Self-pay | Admitting: Family Medicine

## 2023-12-02 DIAGNOSIS — R911 Solitary pulmonary nodule: Secondary | ICD-10-CM

## 2023-12-02 NOTE — Telephone Encounter (Signed)
 Please call and advise the patient that her GI doctor Dr. Cindie got in touch with me and advised me that nodule in her lung had been shown to increase in size since previous imaging.  She does need a dedicated chest scan to further evaluate the lung nodule and I will order it is important that she follow through and get the scan

## 2023-12-04 NOTE — Telephone Encounter (Signed)
Patient aware.

## 2023-12-08 ENCOUNTER — Other Ambulatory Visit: Payer: Self-pay | Admitting: Internal Medicine

## 2023-12-08 ENCOUNTER — Other Ambulatory Visit: Payer: Self-pay | Admitting: Family Medicine

## 2023-12-13 ENCOUNTER — Ambulatory Visit (HOSPITAL_COMMUNITY)
Admission: RE | Admit: 2023-12-13 | Discharge: 2023-12-13 | Disposition: A | Discharge status: Home / Self Care | Payer: Medicare HMO | Source: Ambulatory Visit | Attending: Family Medicine | Admitting: Family Medicine

## 2023-12-13 DIAGNOSIS — R911 Solitary pulmonary nodule: Secondary | ICD-10-CM | POA: Insufficient documentation

## 2023-12-13 DIAGNOSIS — D1809 Hemangioma of other sites: Secondary | ICD-10-CM | POA: Diagnosis not present

## 2023-12-13 DIAGNOSIS — I3139 Other pericardial effusion (noninflammatory): Secondary | ICD-10-CM | POA: Diagnosis not present

## 2023-12-24 NOTE — Addendum Note (Signed)
Addended by: Kerri Perches on: 12/24/2023 07:02 AM   Modules accepted: Orders

## 2023-12-29 NOTE — Telephone Encounter (Signed)
 Copied from CRM [id number]. Topic: Clinical - Lab/Test Results >> Dec 26, 2023  9:28 AM Arlie Benedict B wrote: Reason for CRM: Patient called to speak with someone in regards to a letter she received about her test results called the [facility] line Dr. Doylene Genet nurse is not available today however I was advised to send a crm so that one of the other nurses can reach out to the patient today. [phone]

## 2024-01-05 ENCOUNTER — Institutional Professional Consult (permissible substitution): Payer: Medicare HMO | Admitting: Thoracic Surgery (Cardiothoracic Vascular Surgery)

## 2024-01-05 ENCOUNTER — Other Ambulatory Visit: Payer: Self-pay | Admitting: Thoracic Surgery (Cardiothoracic Vascular Surgery)

## 2024-01-05 ENCOUNTER — Encounter: Payer: Self-pay | Admitting: Thoracic Surgery (Cardiothoracic Vascular Surgery)

## 2024-01-05 VITALS — BP 123/67 | HR 76 | Resp 18 | Ht 69.0 in | Wt 199.0 lb

## 2024-01-05 DIAGNOSIS — R911 Solitary pulmonary nodule: Secondary | ICD-10-CM

## 2024-01-05 NOTE — Progress Notes (Signed)
PCP is Kerri Perches, MD Referring Provider is Kerri Perches, MD  Chief Complaint  Patient presents with   Lung Lesion    Chest CT 1/25    HPI: Sophia [name] is sent for consultation regarding a right lower lobe lung nodule.  Sophia [name] is a 73 year old woman with a history of tobacco use, hypertension, hyperlipidemia, type 2 diabetes, obesity, sleep apnea, COVID-19, coronary calcification, aortic atherosclerosis, reflux, arthritis, anxiety, and depression.  She was noted to have a lung nodule at the base of the right lower lobe on a CT of the abdomen in 2023.  She recently saw Dr. Lodema Hong and had a follow-up scan which showed the nodule had increased in size from 6 mm to 10 x 9 mm.  She does have a history of smoking both tobacco and marijuana.  She does not use either anymore.  She smoked about half a pack a day for 20 years before quitting in 1991.  She complains of shortness of breath and tightness in her chest with exertion.  She cannot walk up a flight of stairs.  Usually resolves with rest for 5 to 10 minutes.  No change in appetite or weight loss.  She does have a history of reflux with frequent heartburn.  Different than the sensation she gets when she is walking.  Sometimes walks with a cane due to arthritis in her hips.  Zubrod Score: At the time of surgery this patient's most appropriate activity status/level should be described as: []     0    Normal activity, no symptoms [x]     1    Restricted in physical strenuous activity but ambulatory, able to do out light work []     2    Ambulatory and capable of self care, unable to do work activities, up and about >50 % of waking hours                              []     3    Only limited self care, in bed greater than 50% of waking hours []     4    Completely disabled, no self care, confined to bed or chair []     5    Moribund   Past Medical History:  Diagnosis Date   Acute respiratory disease due to COVID-19 virus  08/28/2019   Anxiety    Arthritis    Depression    Diabetes mellitus type 2 in obese 11/28/2010   Qualifier: Diagnosis of  By: Lodema Hong MD, Margaret  Diet controlled in 12/2016    Diabetes mellitus without complication (HCC)    GERD (gastroesophageal reflux disease)    Hypercholesteremia    Hypertension    PONV (postoperative nausea and vomiting)    Sleep apnea     Past Surgical History:  Procedure Laterality Date   ABDOMINAL HYSTERECTOMY  11/18/1980   tubes and womb, bleeding and ectopic   ABDOMINAL SURGERY     removal of tumors   BALLOON DILATION N/A 02/16/2021   Procedure: BALLOON DILATION;  Surgeon: Lanelle Bal, DO;  Location: [facility];  Service: Endoscopy;  Laterality: N/A;   BIOPSY  02/16/2021   Procedure: BIOPSY;  Surgeon: Lanelle Bal, DO;  Location: [facility];  Service: Endoscopy;;   BREAST SURGERY Left 11/18/1981   benign tumor   CATARACT EXTRACTION W/PHACO Right 01/11/2014   Procedure: CATARACT EXTRACTION PHACO AND INTRAOCULAR LENS PLACEMENT (IOC);  Surgeon: Loraine Leriche T. Nile Riggs, MD;  Location: [facility];  Service: Ophthalmology;  Laterality: Right;  CDE 5.57   CATARACT EXTRACTION W/PHACO Left 01/25/2014   Procedure: CATARACT EXTRACTION PHACO AND INTRAOCULAR LENS PLACEMENT (IOC);  Surgeon: Loraine Leriche T. Nile Riggs, MD;  Location: [facility];  Service: Ophthalmology;  Laterality: Left;  CDE:5.19   COLONOSCOPY  2007 [facility]   NL EXAM   COLONOSCOPY N/A 04/22/2014   Dr. Fields:Normal mucosa in the terminal ileum/Two COLON polyps REMOVED/ Mild diverticulosis in the ascending colon and transverse colon/The LEFT colon IS redundant/Small internal hemorrhoids. Path: tubular adenoma. Next colonoscopy in 5-10 years   COLONOSCOPY WITH PROPOFOL N/A 11/27/2020   Surgeon: Lanelle Bal, DO;  Nonbleeding internal hemorrhoids, one 6 mm transverse colon polyp resected, diverticulosis in the sigmoid colon.  Pathology with tubular adenoma.  Repeat in 5 years.   ESOPHAGOGASTRODUODENOSCOPY  N/A 04/22/2014   Dr. Fields:MICROCYTIC ANEMIA MOST LIKELY DUE TO ASA/VOLTAREN/Small hiatal hernia/MODERATE Non-erosive gastritis. Negative H.pylori   ESOPHAGOGASTRODUODENOSCOPY (EGD) WITH PROPOFOL N/A 02/16/2021   Surgeon: Lanelle Bal, DO; benign-appearing esophageal stenosis s/p dilation, gastritis biopsied.  Biopsies were benign.   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 Nile Riggs, MD;  Location: [facility];  Service: Ophthalmology;  Laterality: Right;   YAG LASER APPLICATION Left 08/15/2015   Procedure: YAG LASER APPLICATION;  Surgeon: Jethro Bolus, MD;  Location: [facility];  Service: Ophthalmology;  Laterality: Left;    Family History  Problem Relation Age of Onset   Colon polyps Sister 61   Diabetes Sister    Hypertension Sister    Kidney disease Sister    Arthritis Father    Cancer Father        prostate    Hypertension Father    Diabetes Sister    Hypertension Sister    CAD Paternal Grandmother    Colon cancer Neg Hx     Social History Social History   Tobacco Use   Smoking status: Former    Current packs/day: 0.00    Average packs/day: 0.3 packs/day for 20.0 years (5.0 ttl pk-yrs)    Types: Cigarettes    Start date: 01/05/1970    Quit date: 01/05/1990     Years since quitting: 34.0   Smokeless tobacco: Former    Types: Snuff    Quit date: 03/28/2022   Tobacco comments:    Client said she no longer is using snuff. She uses marijuana occasionally per client  Vaping Use   Vaping status: Never Used  Substance Use Topics   Alcohol use: No   Drug use: Yes    Types: Marijuana    Comment: occ marijuana    Current Outpatient Medications  Medication Sig Dispense Refill   acetaminophen (TYLENOL) 650 MG CR tablet Take 650 mg by mouth 2 (two)  times daily.     albuterol (VENTOLIN HFA) 108 (90 Base) MCG/ACT inhaler Inhale 2 puffs into the lungs every 6 (six) hours as needed for wheezing or shortness of breath. 8 g 0   amLODipine (NORVASC) 10 MG tablet Take 1 tablet (10 mg total) by mouth daily. 90 tablet 3   aspirin EC 81 MG tablet Take 81 mg by mouth daily. Swallow whole.     benazepril-hydrochlorthiazide (LOTENSIN HCT) 20-12.5 MG tablet TAKE 2 TABLETS EVERY DAY 180 tablet 3   busPIRone (BUSPAR) 10 MG tablet Take 1 tablet (10 mg total) by mouth 3 (three) times daily. 270 tablet 3   chlorthalidone (HYGROTON) 25 MG tablet TAKE 1 TABLET EVERY DAY 90 tablet 3   Cyanocobalamin (VITAMIN B-12 PO) Take 2 gummies daily.     ferrous gluconate (FERGON) 324 MG tablet Take 324 mg by mouth daily with breakfast.     FLUoxetine (PROZAC) 40 MG capsule Take 1 capsule (40 mg total) by mouth daily. 90 capsule 3   Melatonin 1 MG CAPS Takes two 2 mg gummies daily     Multiple Vitamin (MULTIVITAMIN) tablet Take 1 tablet by mouth daily.     olmesartan-hydrochlorothiazide (BENICAR HCT) 20-12.5 MG tablet Take 1 tablet by mouth daily. 30 tablet 11   omeprazole (PRILOSEC) 40 MG capsule Take 40 mg by mouth 2 (two) times daily.     OVER THE COUNTER MEDICATION Black seed as needed     polyethylene glycol (MIRALAX / GLYCOLAX) 17 g packet Take 17 g by mouth daily as needed.     rosuvastatin (CRESTOR) 20 MG tablet TAKE 1 TABLET EVERY DAY 90 tablet 3   sucralfate (CARAFATE) 1 g  tablet TAKE 1 TABLET TWICE DAILY 180 tablet 3   No current facility-administered medications for this visit.    No Known Allergies  Review of Systems  Constitutional:  Positive for activity change (Limited by arthritis and shortness of breath). Negative for chills, fever and unexpected weight change.  HENT:  Negative for trouble swallowing and voice change.   Respiratory:  Positive for cough and shortness of breath. Negative for wheezing.   Cardiovascular:  Positive for chest pain. Negative for leg swelling.  Gastrointestinal:  Positive for abdominal pain (Heartburn).  Genitourinary:  Negative for difficulty urinating and dyspareunia.  Musculoskeletal:  Positive for arthralgias, gait problem and myalgias.  Neurological:  Negative for seizures, syncope and weakness.  Hematological:  Negative for adenopathy. Does not bruise/bleed easily.  Psychiatric/Behavioral:  Positive for dysphoric mood.   All other systems reviewed and are negative.   BP 123/67   Pulse 76   Resp 18   Ht 5\' 9"  (1.753 m)   Wt 199 lb (90.3 kg)   SpO2 94% Comment: RA  BMI 29.39 kg/m  Physical Exam Vitals reviewed.  Constitutional:      General: She is not in acute distress.    Appearance: She is obese.  HENT:     Head: Normocephalic and atraumatic.  Eyes:     General: No scleral icterus.    Extraocular Movements: Extraocular movements intact.  Neck:     Vascular: No carotid bruit.  Cardiovascular:     Rate and Rhythm: Normal rate and regular rhythm.     Heart sounds: Normal heart sounds. No murmur heard.    No friction rub. No gallop.  Pulmonary:     Effort: Pulmonary effort is normal. No respiratory distress.     Breath sounds: Normal breath sounds. No wheezing or rales.  Abdominal:     General: There is no distension.     Palpations: Abdomen is soft.     Tenderness: There is no abdominal tenderness.  Musculoskeletal:     Cervical back: Neck supple.  Lymphadenopathy:     Cervical: No cervical  adenopathy.  Skin:    General: Skin is warm and dry.  Neurological:     General: No focal deficit present.     Mental Status: She is alert and oriented to person, place, and time.     Cranial Nerves: No cranial nerve deficit.     Motor: No weakness.    Diagnostic Tests: CT CHEST WITHOUT CONTRAST   TECHNIQUE: Multidetector CT imaging of the chest was performed following the standard protocol without IV contrast.   RADIATION DOSE REDUCTION: This exam was performed according to the departmental dose-optimization program which includes automated exposure control, adjustment of the mA and/or kV according to patient size and/or use of iterative reconstruction technique.   COMPARISON:  MRI 11/03/2023, CT chest 06/22/2018, CT lung bases 08/11/2022   FINDINGS: Cardiovascular: Limited evaluation without intravenous contrast. Moderate aortic atherosclerosis. No aneurysm. Coronary vascular calcification. Normal cardiac size. Small pericardial effusion. Mitral calcification   Mediastinum/Nodes: Patent trachea. No thyroid mass. No suspicious lymph nodes. Esophagus within normal limits except for small hiatal hernia   Lungs/Pleura: No acute airspace disease, pleural effusion, or pneumothorax. Minimal apical emphysema. Stable calcific nodule/probable granuloma at the right apex.   Subpleural right posterior lower lobe pulmonary nodule measuring 10 x 8 mm on series 3, image 106.   Upper Abdomen: No acute finding. Partially visualized dilated common bile duct which is chronic   Musculoskeletal: No acute or suspicious osseous abnormality. Hemangioma at T6   IMPRESSION: 1. 10 x 8 mm subpleural right posterior lower lobe pulmonary nodule. Given interval change in appearance, recommend multi disciplinary thoracic consultation and potential PET CT follow-up. 2. Aortic atherosclerosis.   Aortic Atherosclerosis (ICD10-I70.0) and Emphysema (ICD10-J43.9).     Electronically Signed    By: Jasmine Pang M.D.   On: 12/21/2023 21:39 I personally reviewed the CT images.  There is a 10 x 9 mm lung nodule in the lateral basilar right lower lobe.  No mediastinal or hilar adenopathy.  Aortic and coronary atherosclerosis.  Impression: Sophia [name] is a 73 year old woman with a history of tobacco use, hypertension, hyperlipidemia, type 2 diabetes, obesity, sleep apnea, COVID-19, coronary calcification, aortic atherosclerosis, reflux, arthritis, anxiety, depression, and a right lower lobe lung nodule.  Right lower lobe lung nodule-10 x 9 mm nodule lateral basilar right lower lobe.  Increased in size from 6 mm in 2023.  Highly suspicious for new primary bronchogenic carcinoma even with a relatively mild smoking history.  In fact it would be suspicious even if she was a non-smoker.  Infectious or inflammatory nodules are also in the differential but has to be considered a lung cancer and less can be proven otherwise.  She needs a PET/CT to guide our initial diagnostic workup and complete her clinical staging.  She had has dyspnea with exertion and chest tightness she had a negative cath in 2019.  She has had dyspnea for a while but now complains of a tight sensation in her chest as well.  We need pulmonary function testing, but I will see thing on CT that would indicate her lung would be that limiting to her.  Suspicious for angina.  Probably should see cardiology to make sure she would tolerate surgery.  The right lower  lobe lung nodule is small and peripheral.  Pending PET/CT results this does appear to be amenable to wedge resection and node sampling rather than requiring a lobectomy.  I briefly discussed the surgical approach to the nodule which would be done robotically.  She understands the general nature of the procedure including the need for general anesthesia, the incisions to be used, the use of the surgical robot, the use of a drainage tube postoperatively, the expected hospital stay,  and the overall recovery.  Briefly reviewed the indications, risk, benefits, and alternatives.  She understands the risks include death, MI, DVT, PE, bleeding, possible need for transfusion, infection, air leaks, as well as possibility of other unforeseeable complications.  We will have further discussion on that when she returns.  Plan: PET/CT to guide initial diagnostic workup Pulmonary function testing with and without bronchodilators Cardiology clearance has seen Dr. Eden Emms and Dr. Jenene Slicker in the past. Return in 2 to 3 weeks (after PET/CT) to discuss possible wedge resection versus other alternatives  Loreli Slot, MD [facility] [phone]

## 2024-01-09 ENCOUNTER — Encounter (HOSPITAL_COMMUNITY)
Admission: RE | Admit: 2024-01-09 | Discharge: 2024-01-09 | Disposition: A | Discharge status: Home / Self Care | Payer: Medicare HMO | Source: Ambulatory Visit | Attending: Thoracic Surgery (Cardiothoracic Vascular Surgery) | Admitting: Thoracic Surgery (Cardiothoracic Vascular Surgery)

## 2024-01-09 DIAGNOSIS — R911 Solitary pulmonary nodule: Secondary | ICD-10-CM | POA: Diagnosis not present

## 2024-01-09 LAB — GLUCOSE, CAPILLARY: Glucose-Capillary: 109 mg/dL — ABNORMAL HIGH (ref 70–99)

## 2024-01-09 MED ORDER — FLUDEOXYGLUCOSE F - 18 (FDG) INJECTION
9.9500 | Freq: Once | INTRAVENOUS | Status: AC
  Administered 2024-01-09: 9.86 via INTRAVENOUS

## 2024-01-12 ENCOUNTER — Ambulatory Visit (HOSPITAL_COMMUNITY)
Admission: RE | Admit: 2024-01-12 | Discharge: 2024-01-12 | Disposition: A | Discharge status: Home / Self Care | Payer: Medicare HMO | Source: Ambulatory Visit | Attending: Thoracic Surgery (Cardiothoracic Vascular Surgery) | Admitting: Thoracic Surgery (Cardiothoracic Vascular Surgery)

## 2024-01-12 DIAGNOSIS — R911 Solitary pulmonary nodule: Secondary | ICD-10-CM | POA: Insufficient documentation

## 2024-01-12 DIAGNOSIS — J984 Other disorders of lung: Secondary | ICD-10-CM | POA: Insufficient documentation

## 2024-01-12 LAB — PULMONARY FUNCTION TEST
DL/VA % pred: 118 %
DL/VA: 4.72 ml/min/mmHg/L
DLCO unc % pred: 80 %
DLCO unc: 18.31 ml/min/mmHg
FEF 25-75 Post: 1.89 L/s
FEF 25-75 Pre: 1.43 L/s
FEF2575-%Change-Post: 31 %
FEF2575-%Pred-Post: 91 %
FEF2575-%Pred-Pre: 69 %
FEV1-%Change-Post: 5 %
FEV1-%Pred-Post: 69 %
FEV1-%Pred-Pre: 65 %
FEV1-Post: 1.84 L
FEV1-Pre: 1.75 L
FEV1FVC-%Change-Post: 1 %
FEV1FVC-%Pred-Pre: 107 %
FEV6-%Change-Post: 6 %
FEV6-%Pred-Post: 66 %
FEV6-%Pred-Pre: 62 %
FEV6-Post: 2.26 L
FEV6-Pre: 2.11 L
FEV6FVC-%Change-Post: 2 %
FEV6FVC-%Pred-Post: 104 %
FEV6FVC-%Pred-Pre: 101 %
FVC-%Change-Post: 3 %
FVC-%Pred-Post: 64 %
FVC-%Pred-Pre: 61 %
FVC-Post: 2.26 L
FVC-Pre: 2.17 L
Post FEV1/FVC ratio: 82 %
Post FEV6/FVC ratio: 100 %
Pre FEV1/FVC ratio: 81 %
Pre FEV6/FVC Ratio: 97 %
RV % pred: 68 %
RV: 1.72 L
TLC % pred: 69 %
TLC: 4.04 L

## 2024-01-12 MED ORDER — ALBUTEROL SULFATE (2.5 MG/3ML) 0.083% IN NEBU
2.5000 mg | INHALATION_SOLUTION | Freq: Once | RESPIRATORY_TRACT | Status: AC
  Administered 2024-01-12: 2.5 mg via RESPIRATORY_TRACT

## 2024-01-27 ENCOUNTER — Encounter: Payer: Self-pay | Admitting: Thoracic Surgery (Cardiothoracic Vascular Surgery)

## 2024-01-27 ENCOUNTER — Ambulatory Visit: Payer: Medicare HMO | Admitting: Thoracic Surgery (Cardiothoracic Vascular Surgery)

## 2024-01-27 VITALS — BP 104/57 | HR 90 | Resp 20 | Ht 69.0 in | Wt 199.0 lb

## 2024-01-27 DIAGNOSIS — R911 Solitary pulmonary nodule: Secondary | ICD-10-CM | POA: Diagnosis not present

## 2024-01-27 NOTE — Progress Notes (Signed)
 [address]       [place] [address]             [phone]      HPI: Sophia [name] returns to discuss the results of her PET/CT and pulmonary function testing.  Sophia [name] is a 73 year old woman with a history of tobacco use, hypertension, hyperlipidemia, type 2 diabetes, obesity, sleep apnea, COVID-19, coronary calcification, aortic atherosclerosis, reflux, arthritis, anxiety, and depression.   She was noted to have a lung nodule at the base of the right lower lobe on a CT of the abdomen in 2023.  She recently saw Dr. Lodema Hong and had a follow-up scan which showed the nodule had increased in size from 6 mm to 10 x 9 mm.   She does have a history of smoking both tobacco and marijuana.  She does not use either anymore.  She smoked about half a pack a day for 20 years before quitting in 1991.  She complains of shortness of breath and tightness in her chest with exertion.  She cannot walk up a flight of stairs.  Usually resolves with rest for 5 to 10 minutes.  No change in appetite or weight loss.  She does have a history of reflux with frequent heartburn.  Different than the sensation she gets when she is walking.  Sometimes walks with a cane due to arthritis in her hips.  I saw her in the office about 3 weeks ago.  Recommended PET/CT and pulmonary function testing.  Also recommended we have her see cardiology given her dyspnea and chest tightness with exertion.  She had the PET/CT and PFTs, but has not seen cardiology yet.  There is no appointment scheduled currently.  Zubrod Score: At the time of surgery this patient's most appropriate activity status/level should be described as: []     0    Normal activity, no symptoms [x]     1    Restricted in physical strenuous activity but ambulatory, able to do out light work []     2    Ambulatory and capable of self care, unable to do work activities, up and about >50 % of waking hours                              []     3    Only  limited self care, in bed greater than 50% of waking hours []     4    Completely disabled, no self care, confined to bed or chair []     5    Moribund  Past Medical History:  Diagnosis Date   Acute respiratory disease due to COVID-19 virus 08/28/2019   Anxiety    Arthritis    Depression    Diabetes mellitus type 2 in obese 11/28/2010   Qualifier: Diagnosis of  By: Lodema Hong MD, Margaret  Diet controlled in 12/2016    Diabetes mellitus without complication (HCC)    GERD (gastroesophageal reflux disease)    Hypercholesteremia    Hypertension    PONV (postoperative nausea and vomiting)    Sleep apnea    Past Surgical History:  Procedure Laterality Date   ABDOMINAL HYSTERECTOMY  11/18/1980   tubes and womb, bleeding and ectopic   ABDOMINAL SURGERY     removal of tumors   BALLOON DILATION N/A 02/16/2021   Procedure: BALLOON DILATION;  Surgeon: Lanelle Bal, DO;  Location: [facility];  Service: Endoscopy;  Laterality: N/A;   BIOPSY  02/16/2021   Procedure: BIOPSY;  Surgeon: Lanelle Bal, DO;  Location: [facility];  Service: Endoscopy;;   BREAST SURGERY Left 11/18/1981   benign tumor   CATARACT EXTRACTION W/PHACO Right 01/11/2014   Procedure: CATARACT EXTRACTION PHACO AND INTRAOCULAR LENS PLACEMENT (IOC);  Surgeon: Loraine Leriche T. Nile Riggs, MD;  Location: [facility];  Service: Ophthalmology;  Laterality: Right;  CDE 5.57   CATARACT EXTRACTION W/PHACO Left 01/25/2014   Procedure: CATARACT EXTRACTION PHACO AND INTRAOCULAR LENS PLACEMENT (IOC);  Surgeon: Loraine Leriche T. Nile Riggs, MD;  Location: [facility];  Service: Ophthalmology;  Laterality: Left;  CDE:5.19   COLONOSCOPY  2007 [facility]   NL EXAM   COLONOSCOPY N/A 04/22/2014   Dr. Fields:Normal mucosa in the terminal ileum/Two COLON polyps REMOVED/ Mild diverticulosis in the ascending colon and transverse colon/The LEFT colon IS redundant/Small internal hemorrhoids. Path: tubular adenoma. Next colonoscopy in 5-10 years   COLONOSCOPY WITH PROPOFOL N/A  11/27/2020   Surgeon: Lanelle Bal, DO;  Nonbleeding internal hemorrhoids, one 6 mm transverse colon polyp resected, diverticulosis in the sigmoid colon.  Pathology with tubular adenoma.  Repeat in 5 years.   ESOPHAGOGASTRODUODENOSCOPY N/A 04/22/2014   Dr. Fields:MICROCYTIC ANEMIA MOST LIKELY DUE TO ASA/VOLTAREN/Small hiatal hernia/MODERATE Non-erosive gastritis. Negative H.pylori   ESOPHAGOGASTRODUODENOSCOPY (EGD) WITH PROPOFOL N/A 02/16/2021   Surgeon: Lanelle Bal, DO; benign-appearing esophageal stenosis s/p dilation, gastritis biopsied.  Biopsies were benign.   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 Nile Riggs, MD;  Location: [facility];  Service: Ophthalmology;  Laterality: Right;   YAG LASER APPLICATION Left 08/15/2015   Procedure: YAG LASER APPLICATION;  Surgeon: Jethro Bolus, MD;  Location: [facility];  Service: Ophthalmology;  Laterality: Left;    Current Outpatient Medications  Medication Sig Dispense Refill   acetaminophen (TYLENOL) 650 MG CR tablet Take 650 mg by mouth 2 (two) times daily.     albuterol (VENTOLIN HFA) 108 (90 Base) MCG/ACT inhaler Inhale 2 puffs into the lungs every 6 (six) hours as needed for wheezing or shortness of breath. 8 g 0   amLODipine  (NORVASC) 10 MG tablet Take 1 tablet (10 mg total) by mouth daily. 90 tablet 3   aspirin EC 81 MG tablet Take 81 mg by mouth daily. Swallow whole.     benazepril-hydrochlorthiazide (LOTENSIN HCT) 20-12.5 MG tablet TAKE 2 TABLETS EVERY DAY 180 tablet 3   busPIRone (BUSPAR) 10 MG tablet Take 1 tablet (10 mg total) by mouth 3 (three) times daily. 270 tablet 3   chlorthalidone (HYGROTON) 25 MG tablet TAKE 1 TABLET EVERY DAY 90 tablet 3   Cyanocobalamin (VITAMIN B-12 PO) Take 2 gummies daily.     ferrous gluconate (FERGON) 324 MG tablet Take 324 mg by mouth daily with breakfast.  FLUoxetine (PROZAC) 40 MG capsule Take 1 capsule (40 mg total) by mouth daily. 90 capsule 3   Melatonin 1 MG CAPS Takes two 2 mg gummies daily     Multiple Vitamin (MULTIVITAMIN) tablet Take 1 tablet by mouth daily.     olmesartan-hydrochlorothiazide (BENICAR HCT) 20-12.5 MG tablet Take 1 tablet by mouth daily. 30 tablet 11   omeprazole (PRILOSEC) 40 MG capsule Take 40 mg by mouth 2 (two) times daily.     OVER THE COUNTER MEDICATION Black seed as needed     polyethylene glycol (MIRALAX / GLYCOLAX) 17 g packet Take 17 g by mouth daily as needed.     rosuvastatin (CRESTOR) 20 MG tablet TAKE 1 TABLET EVERY DAY 90 tablet 3   sucralfate (CARAFATE) 1 g tablet TAKE 1 TABLET TWICE DAILY 180 tablet 3   No current facility-administered medications for this visit.   Social History   Socioeconomic History   Marital status: Married    Spouse name: Not on file   Number of children: Not on file   Years of education: Not on file   Highest education level: Not on file  Occupational History   Not on file  Tobacco Use   Smoking status: Former    Current packs/day: 0.00    Average packs/day: 0.3 packs/day for 20.0 years (5.0 ttl pk-yrs)    Types: Cigarettes    Start date: 01/05/1970    Quit date: 01/05/1990    Years since quitting: 34.0   Smokeless tobacco: Former    Types: Snuff    Quit date: 03/28/2022   Tobacco comments:     Client said she no longer is using snuff. She uses marijuana occasionally per client  Vaping Use   Vaping status: Never Used  Substance and Sexual Activity   Alcohol use: No   Drug use: Yes    Types: Marijuana    Comment: occ marijuana   Sexual activity: Not Currently    Birth control/protection: Surgical  Other Topics Concern   Not on file  Social History Narrative   Not on file   Social Drivers of Health   Financial Resource Strain: Low Risk  (10/22/2023)   Overall Financial Resource Strain (CARDIA)    Difficulty of Paying Living Expenses: Not hard at all  Food Insecurity: No Food Insecurity (10/22/2023)   Hunger Vital Sign    Worried About Running Out of Food in the Last Year: Never true    Ran Out of Food in the Last Year: Never true  Transportation Needs: No Transportation Needs (10/22/2023)   PRAPARE - Administrator, Civil Service (Medical): No    Lack of Transportation (Non-Medical): No  Physical Activity: Insufficiently Active (10/22/2023)   Exercise Vital Sign    Days of Exercise per Week: 3 days    Minutes of Exercise per Session: 30 min  Stress: No Stress Concern Present (10/22/2023)   [employer] of Occupational Health - Occupational Stress Questionnaire    Feeling of Stress : Not at all  Social Connections: Moderately Integrated (10/22/2023)   Social Connection and Isolation Panel [NHANES]    Frequency of Communication with Friends and Family: More than three times a week    Frequency of Social Gatherings with Friends and Family: More than three times a week    Attends Religious Services: More than 4 times per year    Active Member of [employer] or Organizations: No    Attends Banker Meetings: Never  Marital Status: Married  Catering manager Violence: Not At Risk (10/22/2023)   Humiliation, Afraid, Rape, and Kick questionnaire    Fear of Current or Ex-Partner: No    Emotionally Abused: No    Physically Abused: No    Sexually  Abused: No     Physical Exam BP (!) 104/57 (BP Location: Right Arm, Patient Position: Sitting, Cuff Size: Normal)   Pulse 90   Resp 20   Ht 5\' 9"  (1.753 m)   Wt 199 lb (90.3 kg)   SpO2 92% Comment: RA  BMI 29.39 kg/m  Obese 73 year old woman in no acute distress Alert and oriented x 3 with no focal deficit No cervical or supraclavicular adenopathy Cardiac regular rate and rhythm with no murmur Lungs clear bilaterally No clubbing, cyanosis, or edema.  Diagnostic Tests: NUCLEAR MEDICINE PET SKULL BASE TO THIGH   TECHNIQUE: 9.9 mCi F-18 FDG was injected intravenously. Full-ring PET imaging was performed from the skull base to thigh after the radiotracer. CT data was obtained and used for attenuation correction and anatomic localization.   Fasting blood glucose: 109 mg/dl   COMPARISON:  CT chest 12/13/2023, MR abdomen 11/03/2023, CT abdomen 08/11/2022 and 12/24/2020.   FINDINGS: Mediastinal blood pool activity: SUV max 2.8   Liver activity: SUV max NA   NECK:   No abnormal hypermetabolism.   Incidental CT findings:   None.   CHEST:   10 mm posterolateral right lower lobe nodule (7/40), SUV max 2.3. No additional abnormal hypermetabolism.   Incidental CT findings:   Atherosclerotic calcification of the aorta, aortic valve and coronary arteries. Enlarged pulmonic trunk and heart. Small pericardial effusion, minimally improved.   ABDOMEN/PELVIS:   No abnormal hypermetabolism.   Incidental CT findings:   2.0 cm left adrenal nodule measures 5 Hounsfield units. No specific follow-up necessary. Punctate right renal stone.   SKELETON:   No abnormal hypermetabolism.   Incidental CT findings:   Degenerative changes in the spine.   IMPRESSION: 1. 10 mm right lower lobe nodule does not show metabolism above blood pool but has enlarged from 08/11/2022. Low-grade adenocarcinoma cannot be definitively excluded. Recommend short-term follow-up CT chest without  contrast in 3 months. 2. Small pericardial effusion, minimally improved. 3. Left adrenal adenoma. 4. Punctate right renal stone. 5. Aortic atherosclerosis (ICD10-I70.0). Coronary artery calcification. 6. Enlarged pulmonic trunk, indicative of pulmonary arterial hypertension.     Electronically Signed   By: Leanna Battles M.D.   On: 01/26/2024 16:04 I personally reviewed the CT images.  Low-grade activity in the right lower lobe lung nodule with an SUV of 2.3.  Highly suspicious for a low-grade adenocarcinoma.  Enlarged pulmonic trunk, coronary and aortic atherosclerosis. Infectious and inflammatory nodules are also in the differential.  Pulmonary function testing FVC 2.17 (61%) FEV1 1.75 (65%) FEV1 1.84 (69%) postbronchodilator DLCO 18.31 (80% Impression: Sophia [name] is a 73 year old woman with a history of tobacco use, hypertension, hyperlipidemia, type 2 diabetes, obesity, sleep apnea, COVID-19, coronary calcification, aortic atherosclerosis, reflux, arthritis, anxiety, depression, and a right lower lobe lung nodule.  Right lower lobe lung nodule-increase in size from 6 to 10 mm over a 2-year.  Low-grade uptake on PET/CT.  This is a new primary bronchogenic carcinoma until proven otherwise.  Infectious and/or inflammatory nodules are also in the differential.  Clinical stage would be 1A (T1, N0)  Given the peripheral location at the base of the right lower lobe this would be a difficult location to biopsy.  It is very favorable for  wedge resection for both diagnosis and definitive treatment.  I recommended her that we proceed with a robotic right VATS for right lower lobe wedge resection and lymph node sampling.  I informed her of the general nature of the procedure including the need for general anesthesia, the incisions to be used, the use of the surgical robot, the use of a drainage tube postoperatively, the expected hospital stay, and the overall recovery.  I informed her of the  indications, risks, benefits, and alternatives.  She understands the risks include, but not limited to death, MI, DVT, PE, bleeding, possible need for transfusion, infection, air leaks, cardiac arrhythmias, as well as the possibility of other procedural complications.  Her dyspnea seems out of proportion to her pulmonary function testing and she does have some evidence of coronary atherosclerosis on CT.  She had a negative catheterization back in 2019.  I think she needs to be seen by cardiology prior to surgery.  Will try to arrange that as soon as possible.  Plan: Will schedule for robotic assisted right VATS for right lower lobe wedge resection and lymph node sampling once cleared by cardiology.  Loreli Slot, MD [facility] [phone]

## 2024-01-28 ENCOUNTER — Telehealth: Payer: Self-pay

## 2024-01-28 DIAGNOSIS — Z01818 Encounter for other preprocedural examination: Secondary | ICD-10-CM

## 2024-01-28 NOTE — Telephone Encounter (Signed)
 Patient notified and verbalized understanding. Pt will come to [place] office to pick up instructions for Lexi.

## 2024-01-28 NOTE — Addendum Note (Signed)
 Addended by: Leonides Schanz C on: 01/28/2024 10:02 AM   Modules accepted: Orders

## 2024-01-28 NOTE — Telephone Encounter (Signed)
-----   Message from Charlton Haws sent at 01/28/2024  9:44 AM EDT ----- I haven't seen in 3 years most recently seen by Vishnu 2023 Jake Seats would have her get echo and myovue at [facility] and f/u with Vishnu to clear for surgery ----- Message ----- From: Davina Poke, RN Sent: 01/28/2024   9:05 AM EDT To: Wendall Stade, MD  Good morning!  Dr. Dorris Fetch might have routed his note to you but just a heads up he saw her yesterday for a new lung nodule//cancer and he plans to operate on that but needs cardiology clearance first.  My front office placed referral back to you.  "Her dyspnea seems out of proportion to her pulmonary function testing and she does have some evidence of coronary atherosclerosis on CT.  She had a negative catheterization back in 2019.  I think she needs to be seen by cardiology prior to surgery.  Will try to arrange that as soon as possible."   Thank you!  Hope you are doing well!  Ryan

## 2024-01-30 ENCOUNTER — Ambulatory Visit (INDEPENDENT_AMBULATORY_CARE_PROVIDER_SITE_OTHER): Payer: Medicare HMO | Admitting: Family Medicine

## 2024-01-30 ENCOUNTER — Encounter: Payer: Self-pay | Admitting: Family Medicine

## 2024-01-30 VITALS — BP 120/69 | HR 78 | Ht 69.0 in | Wt 198.1 lb

## 2024-01-30 DIAGNOSIS — Z0001 Encounter for general adult medical examination with abnormal findings: Secondary | ICD-10-CM

## 2024-01-30 DIAGNOSIS — E1169 Type 2 diabetes mellitus with other specified complication: Secondary | ICD-10-CM

## 2024-01-30 DIAGNOSIS — R252 Cramp and spasm: Secondary | ICD-10-CM | POA: Diagnosis not present

## 2024-01-30 DIAGNOSIS — D539 Nutritional anemia, unspecified: Secondary | ICD-10-CM

## 2024-01-30 DIAGNOSIS — E1159 Type 2 diabetes mellitus with other circulatory complications: Secondary | ICD-10-CM

## 2024-01-30 DIAGNOSIS — E785 Hyperlipidemia, unspecified: Secondary | ICD-10-CM | POA: Diagnosis not present

## 2024-01-30 DIAGNOSIS — R531 Weakness: Secondary | ICD-10-CM

## 2024-01-30 DIAGNOSIS — Z0000 Encounter for general adult medical examination without abnormal findings: Secondary | ICD-10-CM

## 2024-01-30 DIAGNOSIS — F410 Panic disorder [episodic paroxysmal anxiety] without agoraphobia: Secondary | ICD-10-CM | POA: Diagnosis not present

## 2024-01-30 MED ORDER — BUSPIRONE HCL 30 MG PO TABS
ORAL_TABLET | ORAL | 3 refills | Status: DC
Start: 2024-01-30 — End: 2024-05-17

## 2024-01-30 MED ORDER — ALBUTEROL SULFATE HFA 108 (90 BASE) MCG/ACT IN AERS: 2.0000 | INHALATION_SPRAY | Freq: Four times a day (QID) | RESPIRATORY_TRACT | 0 refills | Status: AC | PRN

## 2024-01-30 NOTE — Patient Instructions (Addendum)
 F/u in 7 to 8 weeks, call if you need me sooner  Dose increase in buspar to 30 mg two times daily  All the best with upcoming surgery  Albuterol is refilled  Fasting labs next week, nurse please order, CBC, lipid, cmp and eGFr , hBA1c , TSH and vit D   You will be referred to therapy  Thanks for choosing [facility], we consider it a privelige to serve you.

## 2024-01-30 NOTE — Progress Notes (Addendum)
 Sophia [name] Kitchen   Sophia [name]     MRN: [id number]      DOB: [birth year]  Chief Complaint  Patient presents with   Annual Exam    Review dosage of Wellbutrin Foot exam Review DX from pulmonology and Cardio    HPI: Patient is in for annual physical exam. Concerns as above are discussed and addressed. Recent labs,  are reviewed. Immunization is reviewed , and  updated if needed. Consent for integrated behavioral health services is obtained during the visit , as he reports uncontrolled and increased anxiety and depression  Care Management  Note   PE: BP 120/69   Pulse 78   Ht 5\' 9"  (1.753 m)   Wt 198 lb 1.9 oz (89.9 kg)   SpO2 92%   BMI 29.26 kg/m   Pleasant  female, alert and oriented x 3, in no cardio-pulmonary distress. Afebrile. HEENT No facial trauma or asymetry. Sinuses non tender.  Extra occullar muscles intact.. External ears normal, . Neck: supple, no adenopathy,JVD or thyromegaly.No bruits.  Chest: Clear to ascultation bilaterally.No crackles or wheezes. Non tender to palpation   Cardiovascular system; Heart sounds normal,  S1 and  S2 ,no S3.  No murmur, or thrill. Apical beat not displaced Peripheral pulses normal.  Abdomen: Soft, non tender, no organomegaly or masses. al tenderness.   Musculoskeletal exam: Decreased though adequate  ROM of spine, hips , shoulders and knees. No deformity ,swelling or crepitus noted. No muscle wasting or atrophy.   Neurologic: Cranial nerves 2 to 12 intact. Power, tone ,sensation and reflexes normal throughout. No disturbance in gait. No tremor.  Skin: Intact, no ulceration, erythema , scaling or rash noted. Pigmentation normal throughout  Psych; Normal mood and affect. Judgement and concentration normal   Assessment & Plan:  Panic anxiety syndrome Uncontrolled , inc buspar to 30 mg twice daily  Encounter for annual physical exam Annual exam as documented. Counseling done  re healthy lifestyle involving  commitment to 150 minutes exercise per week, heart healthy diet, and attaining healthy weight.The importance of adequate sleep also discussed. Regular seat belt use and home safety, is also discussed. Changes in health habits are decided on by the patient with goals and time frames  set for achieving them. Immunization and cancer screening needs are specifically addressed at this visit.   Type 2 diabetes mellitus with other specified complication (HCC) Diabetes associated with hypertension and hyperlipidemia  Sophia [name] is reminded of the importance of commitment to daily physical activity for 30 minutes or more, as able and the need to limit carbohydrate intake to 30 to 60 grams per meal to help with blood sugar control.   The need to take medication as prescribed, test blood sugar as directed, and to call between visits if there is a concern that blood sugar is uncontrolled is also discussed.   Sophia [name] is reminded of the importance of daily foot exam, annual eye examination, and good blood sugar, blood pressure and cholesterol control.     Latest Ref Rng & Units 10/20/2023    9:22 AM 09/17/2023   10:07 AM 02/24/2023    8:57 AM 08/11/2022    9:13 AM 05/31/2022   10:09 AM  Diabetic Labs  HbA1c 4.8 - 5.6 %  6.4  6.5   6.0   Chol 100 - 199 mg/dL   782   956   HDL >21 mg/dL   75   58   Calc LDL 0 - 99 mg/dL   66  55   Triglycerides 0 - 149 mg/dL   536   144   Creatinine 0.57 - 1.00 mg/dL 3.15  4.00  8.67  6.19  1.07       01/30/2024    1:02 PM 01/27/2024    2:38 PM 01/05/2024    3:08 PM 10/30/2023   10:40 AM 10/22/2023    9:47 AM 09/23/2023    2:11 PM 06/09/2023    1:55 PM  BP/Weight  Systolic BP 120 104 123 118 -- 119 110  Diastolic BP 69 57 67 71 -- 72 65  Wt. (Lbs) 198.12 199 199 202.2 185 200.12 191  BMI 29.26 kg/m2 29.39 kg/m2 29.39 kg/m2 29.86 kg/m2 27.32 kg/m2 29.55 kg/m2 28.21 kg/m2      01/30/2024    1:00 PM 11/14/2022    9:00 AM  Foot/eye exam completion dates  Foot  Form Completion Done Done

## 2024-02-01 NOTE — Assessment & Plan Note (Signed)

## 2024-02-01 NOTE — Assessment & Plan Note (Addendum)
 Diabetes associated with hypertension and hyperlipidemia  [name] is reminded of the importance of commitment to daily physical activity for 30 minutes or more, as able and the need to limit carbohydrate intake to 30 to 60 grams per meal to help with blood sugar control.   The need to take medication as prescribed, test blood sugar as directed, and to call between visits if there is a concern that blood sugar is uncontrolled is also discussed.   [name] is reminded of the importance of daily foot exam, annual eye examination, and good blood sugar, blood pressure and cholesterol control.     Latest Ref Rng & Units 10/20/2023    9:22 AM 09/17/2023   10:07 AM 02/24/2023    8:57 AM 08/11/2022    9:13 AM 05/31/2022   10:09 AM  Diabetic Labs  HbA1c 4.8 - 5.6 %  6.4  6.5   6.0   Chol 100 - 199 mg/dL   409   811   HDL >91 mg/dL   75   58   Calc LDL 0 - 99 mg/dL   66   55   Triglycerides 0 - 149 mg/dL   478   295   Creatinine 0.57 - 1.00 mg/dL 6.21  3.08  6.57  8.46  1.07       01/30/2024    1:02 PM 01/27/2024    2:38 PM 01/05/2024    3:08 PM 10/30/2023   10:40 AM 10/22/2023    9:47 AM 09/23/2023    2:11 PM 06/09/2023    1:55 PM  BP/Weight  Systolic BP 120 104 123 118 -- 119 110  Diastolic BP 69 57 67 71 -- 72 65  Wt. (Lbs) 198.12 199 199 202.2 185 200.12 191  BMI 29.26 kg/m2 29.39 kg/m2 29.39 kg/m2 29.86 kg/m2 27.32 kg/m2 29.55 kg/m2 28.21 kg/m2      01/30/2024    1:00 PM 11/14/2022    9:00 AM  Foot/eye exam completion dates  Foot Form Completion Done Done

## 2024-02-01 NOTE — Assessment & Plan Note (Signed)
 Uncontrolled , inc buspar to 30 mg twice daily

## 2024-02-06 DIAGNOSIS — R252 Cramp and spasm: Secondary | ICD-10-CM | POA: Diagnosis not present

## 2024-02-06 DIAGNOSIS — Z0000 Encounter for general adult medical examination without abnormal findings: Secondary | ICD-10-CM | POA: Diagnosis not present

## 2024-02-06 DIAGNOSIS — E559 Vitamin D deficiency, unspecified: Secondary | ICD-10-CM | POA: Diagnosis not present

## 2024-02-06 DIAGNOSIS — R531 Weakness: Secondary | ICD-10-CM | POA: Diagnosis not present

## 2024-02-06 DIAGNOSIS — D539 Nutritional anemia, unspecified: Secondary | ICD-10-CM | POA: Diagnosis not present

## 2024-02-06 DIAGNOSIS — E1159 Type 2 diabetes mellitus with other circulatory complications: Secondary | ICD-10-CM | POA: Diagnosis not present

## 2024-02-06 DIAGNOSIS — E785 Hyperlipidemia, unspecified: Secondary | ICD-10-CM | POA: Diagnosis not present

## 2024-02-07 LAB — CMP14+EGFR
ALT: 15 IU/L (ref 0–32)
AST: 18 IU/L (ref 0–40)
Albumin: 4.9 g/dL — ABNORMAL HIGH (ref 3.8–4.8)
Alkaline Phosphatase: 69 IU/L (ref 44–121)
BUN/Creatinine Ratio: 29 — ABNORMAL HIGH (ref 12–28)
BUN: 33 mg/dL — ABNORMAL HIGH (ref 8–27)
Bilirubin Total: 0.2 mg/dL (ref 0.0–1.2)
CO2: 27 mmol/L (ref 20–29)
Calcium: 10.3 mg/dL (ref 8.7–10.3)
Chloride: 102 mmol/L (ref 96–106)
Creatinine, Ser: 1.14 mg/dL — ABNORMAL HIGH (ref 0.57–1.00)
Globulin, Total: 2.8 g/dL (ref 1.5–4.5)
Glucose: 108 mg/dL — ABNORMAL HIGH (ref 70–99)
Potassium: 4 mmol/L (ref 3.5–5.2)
Sodium: 145 mmol/L — ABNORMAL HIGH (ref 134–144)
Total Protein: 7.7 g/dL (ref 6.0–8.5)
eGFR: 51 mL/min/{1.73_m2} — ABNORMAL LOW (ref >59)

## 2024-02-07 LAB — TSH+FREE T4
Free T4: 0.93 ng/dL (ref 0.82–1.77)
TSH: 2.32 u[IU]/mL (ref 0.450–4.500)

## 2024-02-07 LAB — HEMOGLOBIN A1C
Est. average glucose Bld gHb Est-mCnc: 140 mg/dL
Hgb A1c MFr Bld: 6.5 % — ABNORMAL HIGH (ref 4.8–5.6)

## 2024-02-07 LAB — CBC WITH DIFFERENTIAL/PLATELET
Basophils Absolute: 0 10*3/uL (ref 0.0–0.2)
Basos: 1 %
EOS (ABSOLUTE): 0.2 10*3/uL (ref 0.0–0.4)
Eos: 3 %
Hematocrit: 38.8 % (ref 34.0–46.6)
Hemoglobin: 12.4 g/dL (ref 11.1–15.9)
Immature Grans (Abs): 0 10*3/uL (ref 0.0–0.1)
Immature Granulocytes: 0 %
Lymphocytes Absolute: 2.4 10*3/uL (ref 0.7–3.1)
Lymphs: 46 %
MCH: 26.2 pg — ABNORMAL LOW (ref 26.6–33.0)
MCHC: 32 g/dL (ref 31.5–35.7)
MCV: 82 fL (ref 79–97)
Monocytes Absolute: 0.5 10*3/uL (ref 0.1–0.9)
Monocytes: 10 %
Neutrophils Absolute: 2.1 10*3/uL (ref 1.4–7.0)
Neutrophils: 40 %
Platelets: 222 10*3/uL (ref 150–450)
RBC: 4.74 x10E6/uL (ref 3.77–5.28)
RDW: 15 % (ref 11.7–15.4)
WBC: 5.3 10*3/uL (ref 3.4–10.8)

## 2024-02-07 LAB — LIPID PANEL
Chol/HDL Ratio: 2.1 ratio (ref 0.0–4.4)
Cholesterol, Total: 120 mg/dL (ref 100–199)
HDL: 58 mg/dL (ref >39)
LDL Chol Calc (NIH): 47 mg/dL (ref 0–99)
Triglycerides: 73 mg/dL (ref 0–149)
VLDL Cholesterol Cal: 15 mg/dL (ref 5–40)

## 2024-02-07 LAB — VITAMIN D 25 HYDROXY (VIT D DEFICIENCY, FRACTURES): Vit D, 25-Hydroxy: 32.3 ng/mL (ref 30.0–100.0)

## 2024-02-13 ENCOUNTER — Ambulatory Visit (INDEPENDENT_AMBULATORY_CARE_PROVIDER_SITE_OTHER): Payer: Self-pay | Admitting: Professional Counselor

## 2024-02-13 DIAGNOSIS — F4310 Post-traumatic stress disorder, unspecified: Secondary | ICD-10-CM | POA: Diagnosis not present

## 2024-02-13 NOTE — BH Specialist Note (Signed)
 Collaborative Care Initial Assessment  Session Start time: 10:00   Session End time: 11:00  Total time in minutes: 60 min   Type of Contact:  Face to Face Patient consent obtained:  Yes Types of Service: Collaborative care  Summary  Patient is a 73 yo female being referred to collaborative care by her pcp for anxiety and depression. Patient was engaged and cooperative during session.   Reason for referral in patient/family's own words:  "Just need someone to talk to"  Patient's goal for today's visit: "Try to get over my childhood"   History of Present illness:   The patient is a 73 year old female who presented for a collaborative care assessment. Her primary concern is difficulty adjusting to the aging process, struggling with the realization that she can no longer do as much as she once could, and experiencing anxiety related to her health. She recently had a pulmonology visit where a spot was detected on her lung, which has raised concerns about the possibility of cancer. She describes her day-to-day life as challenging due to stress, medical issues, and the emotional burden of unresolved trauma, shame, and grief. The patient reports a difficult upbringing; her mother was addicted to drugs, her siblings were taken away and separated, and she never fully recovered from this experience, feeling unable to let go of the past.  Currently, her PHQ-9 score is 5, and her GAD-7 score is 6. 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 She denies a personal or family history of bipolar disorder, suicide attempts, psychiatric hospitalizations, or substance abuse. The patient reports significant improvement in her anxiety and sleep since starting B-SPAR, which has been beneficial for her overall functioning. She expresses a desire for therapy to process and move beyond her childhood trauma, acknowledging that it continues to affect  her today.  Given her current state, we will conduct a psychiatric consultation to explore any additional recommendations and develop a treatment plan to address her ongoing needs. Clinical Assessment   PHQ-9 Assessments:    02/13/2024   10:07 AM 10/22/2023    9:57 AM 09/23/2023    2:46 PM 04/09/2023    3:23 PM 02/25/2023    3:58 PM  Depression screen PHQ 2/9  Decreased Interest 1 0 0 0 2  Down, Depressed, Hopeless 1 0 0 0 2  PHQ - 2 Score 2 0 0 0 4  Altered sleeping 0   1 3  Tired, decreased energy 1   1 3   Change in appetite 0   0 0  Feeling bad or failure about yourself  1   0 0  Trouble concentrating 1   0 0  Moving slowly or fidgety/restless 0   0 3  Suicidal thoughts 0   0 0  PHQ-9 Score 5   2 13   Difficult doing work/chores Somewhat difficult   Not difficult at all Somewhat difficult    GAD-7 Assessments:    02/13/2024   10:18 AM 01/30/2024    1:38 PM 02/25/2023    3:59 PM 06/04/2022   11:50 AM  GAD 7 : Generalized Anxiety Score  Nervous, Anxious, on Edge 1 3 0 0  Control/stop worrying 1 3 1  0  Worry too much - different things 1 3 1  0  Trouble relaxing 0 3 1 0  Restless 1 2 1  0  Easily annoyed or irritable 1 1 1 1   Afraid - awful might happen 1  3 1 1   Total GAD 7 Score 6 18 6 2   Anxiety Difficulty Somewhat difficult Extremely difficult Somewhat difficult      Social History:  Household: Lives with husband Marital status: Married Number of Children: 2 Employment: Retired Programme researcher, broadcasting/film/video: High school  Psychiatric Review of systems: Insomnia: No Changes in appetite: No Decreased need for sleep: No Family history of bipolar disorder: No Hallucinations: No   Paranoia: No    Psychotropic medications: Current medications: Buspar 30 mg daily Patient taking medications as prescribed:  Yes Side effects reported: No  Current medications (medication list) Current Outpatient Medications on File Prior to Visit  Medication Sig Dispense Refill   acetaminophen (TYLENOL) 650  MG CR tablet Take 650 mg by mouth 2 (two) times daily.     albuterol (VENTOLIN HFA) 108 (90 Base) MCG/ACT inhaler Inhale 2 puffs into the lungs every 6 (six) hours as needed for wheezing or shortness of breath. 8 g 0   amLODipine (NORVASC) 10 MG tablet Take 1 tablet (10 mg total) by mouth daily. 90 tablet 3   aspirin EC 81 MG tablet Take 81 mg by mouth daily. Swallow whole.     benazepril-hydrochlorthiazide (LOTENSIN HCT) 20-12.5 MG tablet TAKE 2 TABLETS EVERY DAY 180 tablet 3   buPROPion (WELLBUTRIN XL) 150 MG 24 hr tablet Take 150 mg by mouth daily. (Patient not taking: Reported on 01/30/2024)     busPIRone (BUSPAR) 30 MG tablet Take one tablet by mouth two times daily 60 tablet 3   chlorthalidone (HYGROTON) 25 MG tablet TAKE 1 TABLET EVERY DAY 90 tablet 3   Cyanocobalamin (VITAMIN B-12 PO) Take 2 gummies daily.     ferrous gluconate (FERGON) 324 MG tablet Take 324 mg by mouth daily with breakfast.     FLUoxetine (PROZAC) 40 MG capsule Take 1 capsule (40 mg total) by mouth daily. 90 capsule 3   Melatonin 1 MG CAPS Takes two 2 mg gummies daily     Multiple Vitamin (MULTIVITAMIN) tablet Take 1 tablet by mouth daily. (Patient not taking: Reported on 01/30/2024)     olmesartan-hydrochlorothiazide (BENICAR HCT) 20-12.5 MG tablet Take 1 tablet by mouth daily. 30 tablet 11   omeprazole (PRILOSEC) 40 MG capsule Take 40 mg by mouth 2 (two) times daily. (Patient not taking: Reported on 01/30/2024)     OVER THE COUNTER MEDICATION Black seed as needed (Patient not taking: Reported on 01/30/2024)     polyethylene glycol (MIRALAX / GLYCOLAX) 17 g packet Take 17 g by mouth daily as needed.     rosuvastatin (CRESTOR) 20 MG tablet TAKE 1 TABLET EVERY DAY 90 tablet 3   sucralfate (CARAFATE) 1 g tablet TAKE 1 TABLET TWICE DAILY 180 tablet 3   No current facility-administered medications on file prior to visit.    Psychiatric History: Past psychiatry diagnosis: None reported Patient currently being seen by  therapist/psychiatrist: Denies Prior Suicide Attempts: Denies Past psychiatry Hospitalization(s): Denies Past history of violence: Denies  Traumatic Experiences: History or current traumatic events (natural disaster, house fire, etc.)? no History or current physical trauma?  no History or current emotional trauma?  yes History or current sexual trauma?  no History or current domestic or intimate partner violence?  no PTSD symptoms if any traumatic experiences: Yes unwanted memories, flashbacks  Alcohol and/or Substance Use History   Tobacco Alcohol Other substances  Current use  No None  Past use  None No  Past treatment      Withdrawal Potential: None  Self-harm Behaviors  Risk Assessment Self-harm risk factors: Aging, PTSD Patient endorses recent thoughts of harming self: Denies  Guns in the home:  No   Protective factors: Supportive husband, hopefulness  Danger to Others Risk Assessment Danger to others risk factors:  None Patient endorses recent thoughts of harming others: Denies  Consulting civil engineer discussed emergency crisis plan with client and provided local emergency services resources.  Mental status exam:   General Appearance Luretha Murphy:  Casual Eye Contact:  Good Motor Behavior:  Normal Speech:  Normal Level of Consciousness:  Alert Mood:  Depressed Affect:  Depressed Anxiety Level:  Minimal Thought Process:  Coherent Thought Content:  WNL Perception:  Normal Judgment:  Good Insight:  Present  Diagnosis: PTSD (post-traumatic stress disorder)    Goals: Increase healthy adjustment to current life circumstances   Interventions: Behavioral Activation   Follow-up Plan: Refer to [facility] Outpatient Therapy

## 2024-02-17 ENCOUNTER — Ambulatory Visit (HOSPITAL_COMMUNITY)
Admission: RE | Admit: 2024-02-17 | Discharge: 2024-02-17 | Disposition: A | Discharge status: Home / Self Care | Source: Ambulatory Visit | Attending: Internal Medicine | Admitting: Internal Medicine

## 2024-02-17 ENCOUNTER — Encounter (HOSPITAL_COMMUNITY)
Admission: RE | Admit: 2024-02-17 | Discharge: 2024-02-17 | Disposition: A | Discharge status: Home / Self Care | Source: Ambulatory Visit | Attending: Internal Medicine | Admitting: Internal Medicine

## 2024-02-17 ENCOUNTER — Encounter (HOSPITAL_COMMUNITY): Payer: Self-pay

## 2024-02-17 DIAGNOSIS — Z01818 Encounter for other preprocedural examination: Secondary | ICD-10-CM | POA: Insufficient documentation

## 2024-02-17 DIAGNOSIS — I10 Essential (primary) hypertension: Secondary | ICD-10-CM | POA: Insufficient documentation

## 2024-02-17 DIAGNOSIS — Z01810 Encounter for preprocedural cardiovascular examination: Secondary | ICD-10-CM | POA: Insufficient documentation

## 2024-02-17 DIAGNOSIS — E119 Type 2 diabetes mellitus without complications: Secondary | ICD-10-CM | POA: Insufficient documentation

## 2024-02-17 DIAGNOSIS — C801 Malignant (primary) neoplasm, unspecified: Secondary | ICD-10-CM

## 2024-02-17 DIAGNOSIS — Z87891 Personal history of nicotine dependence: Secondary | ICD-10-CM | POA: Diagnosis not present

## 2024-02-17 DIAGNOSIS — R0609 Other forms of dyspnea: Secondary | ICD-10-CM | POA: Insufficient documentation

## 2024-02-17 DIAGNOSIS — E785 Hyperlipidemia, unspecified: Secondary | ICD-10-CM | POA: Diagnosis not present

## 2024-02-17 HISTORY — DX: Malignant (primary) neoplasm, unspecified: C80.1

## 2024-02-17 LAB — NM MYOCAR MULTI W/SPECT W/WALL MOTION / EF
Estimated workload: 1
Exercise duration (min): 0 min
Exercise duration (sec): 0 s
LV dias vol: 52 mL (ref 46–106)
LV sys vol: 16 mL (ref 14–42)
MPHR: 147 {beats}/min
Nuc Stress EF: 70 %
Peak HR: 86 {beats}/min
Percent HR: 85 %
RATE: 0.3
Rest HR: 60 {beats}/min
Rest Nuclear Isotope Dose: 10.1 mCi
SDS: 0
SRS: 0
SSS: 0
ST Depression (mm): 0 mm
Stress Nuclear Isotope Dose: 30.6 mCi
TID: 1.12

## 2024-02-17 LAB — ECHOCARDIOGRAM COMPLETE
AR max vel: 2.16 cm2
AV Area VTI: 2.16 cm2
AV Area mean vel: 2.2 cm2
AV Mean grad: 7.8 mmHg
AV Peak grad: 14.4 mmHg
Ao pk vel: 1.89 m/s
Area-P 1/2: 2.83 cm2
S' Lateral: 2.3 cm

## 2024-02-17 MED ORDER — TECHNETIUM TC 99M TETROFOSMIN IV KIT
30.0000 | PACK | Freq: Once | INTRAVENOUS | Status: AC | PRN
  Administered 2024-02-17: 30.6 via INTRAVENOUS

## 2024-02-17 MED ORDER — SODIUM CHLORIDE FLUSH 0.9 % IV SOLN
INTRAVENOUS | Status: AC
  Filled 2024-02-17: qty 10

## 2024-02-17 MED ORDER — REGADENOSON 0.4 MG/5ML IV SOLN
INTRAVENOUS | Status: AC
  Administered 2024-02-17: 0.4 mg via INTRAVENOUS
  Filled 2024-02-17: qty 5

## 2024-02-17 MED ORDER — TECHNETIUM TC 99M TETROFOSMIN IV KIT
10.0000 | PACK | Freq: Once | INTRAVENOUS | Status: AC | PRN
  Administered 2024-02-17: 10.1 via INTRAVENOUS

## 2024-02-17 NOTE — Patient Instructions (Signed)
 If your symptoms worsen or you have thoughts of suicide/homicide, PLEASE SEEK IMMEDIATE MEDICAL ATTENTION.  You may always call:   National Suicide Hotline: 988 or (913)195-1115 [place] Crisis Line: 343-675-8477 Crisis Recovery in [place]: [phone]     These are available 24 hours a day, 7 days a week.

## 2024-02-17 NOTE — Progress Notes (Signed)
*  PRELIMINARY RESULTS* Echocardiogram 2D Echocardiogram has been performed.  Stacey Drain 02/17/2024, 8:53 AM

## 2024-02-18 ENCOUNTER — Telehealth (INDEPENDENT_AMBULATORY_CARE_PROVIDER_SITE_OTHER): Payer: Self-pay | Admitting: Professional Counselor

## 2024-02-18 DIAGNOSIS — F4310 Post-traumatic stress disorder, unspecified: Secondary | ICD-10-CM

## 2024-02-18 NOTE — BH Specialist Note (Signed)
 Virtual Behavioral Health Treatment Plan Team Note  MRN: [id number] NAME: Sophia [name]  DATE: 02/19/24  Start time: Start Time: 0942 End time: Stop Time: 0947 Total time: Total Time in Minutes (Visit): 5  Total number of Virtual BH Treatment Team Plan encounters: 1/4  Treatment Team Attendees: Dr. Vivia Ewing and Esmond Harps   Collaborative Care Psychiatric Consultant Case Review    Assessment/Provisional Diagnosis Sophia [name] is a 73 y.o. year old adult with history of HTN, HLD. The patient is referred for anxiety.  Patient having difficulty adjusting to declining health condition.  Currently on prozac 40 and buspar 30 bid which seems effective to her.    # GAD- with depressed mood.    Recommendation Refer to therapy Continue prozac 40 mg daily Continue buspar 20 mg bid BH specialist to follow up.   Diagnoses:    ICD-10-CM   1. PTSD (post-traumatic stress disorder)  F43.10       Goals, Interventions and Follow-up Plan Goals: Increase healthy adjustment to current life circumstances Interventions: Behavioral Activation Medication Management Recommendations:  Continue prozac 40 mg daily Continue buspar 20 mg bid BH specialist to follow up. Follow-up Plan:Follow up and refer to traditional therapy.   History of the present illness Presenting Problem/Current Symptoms:  The patient is a 73 year old female who presented for a collaborative care assessment. Her primary concern is difficulty adjusting to the aging process, struggling with the realization that she can no longer do as much as she once could, and experiencing anxiety related to her health. She recently had a pulmonology visit where a spot was detected on her lung, which has raised concerns about the possibility of cancer. She describes her day-to-day life as challenging due to stress, medical issues, and the emotional burden of unresolved trauma, shame, and grief. The patient reports a difficult upbringing; her  mother was addicted to drugs, her siblings were taken away and separated, and she never fully recovered from this experience, feeling unable to let go of the past.   Currently, her PHQ-9 score is 5, and her GAD-7 score is 6. 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 She denies a personal or family history of bipolar disorder, suicide attempts, psychiatric hospitalizations, or substance abuse. The patient reports significant improvement in her anxiety and sleep since starting B-SPAR, which has been beneficial for her overall functioning. She expresses a desire for therapy to process and move beyond her childhood trauma, acknowledging that it continues to affect her today.   Screenings PHQ-9 Assessments:     02/13/2024   10:07 AM 10/22/2023    9:57 AM 09/23/2023    2:46 PM  Depression screen PHQ 2/9  Decreased Interest 1 0 0  Down, Depressed, Hopeless 1 0 0  PHQ - 2 Score 2 0 0  Altered sleeping 0    Tired, decreased energy 1    Change in appetite 0    Feeling bad or failure about yourself  1    Trouble concentrating 1    Moving slowly or fidgety/restless 0    Suicidal thoughts 0    PHQ-9 Score 5    Difficult doing work/chores Somewhat difficult     GAD-7 Assessments:     02/13/2024   10:18 AM 01/30/2024    1:38 PM 02/25/2023    3:59 PM 06/04/2022   11:50 AM  GAD 7 : Generalized Anxiety Score  Nervous, Anxious, on Edge 1 3  0 0  Control/stop worrying 1 3 1  0  Worry too much - different things 1 3 1  0  Trouble relaxing 0 3 1 0  Restless 1 2 1  0  Easily annoyed or irritable 1 1 1 1   Afraid - awful might happen 1 3 1 1   Total GAD 7 Score 6 18 6 2   Anxiety Difficulty Somewhat difficult Extremely difficult Somewhat difficult     Past Medical History Past Medical History:  Diagnosis Date   Acute respiratory disease due to COVID-19 virus 08/28/2019   Anxiety    Arthritis    Depression    Diabetes mellitus type 2 in  obese 11/28/2010   Qualifier: Diagnosis of  By: Lodema Hong MD, Margaret  Diet controlled in 12/2016    Diabetes mellitus without complication (HCC)    GERD (gastroesophageal reflux disease)    Hypercholesteremia    Hypertension    PONV (postoperative nausea and vomiting)    Sleep apnea     Vital signs: There were no vitals filed for this visit.  Allergies:  Allergies as of 02/18/2024   (No Known Allergies)    Medication History Current medications:  Outpatient Encounter Medications as of 02/18/2024  Medication Sig   acetaminophen (TYLENOL) 650 MG CR tablet Take 650 mg by mouth 2 (two) times daily.   albuterol (VENTOLIN HFA) 108 (90 Base) MCG/ACT inhaler Inhale 2 puffs into the lungs every 6 (six) hours as needed for wheezing or shortness of breath.   amLODipine (NORVASC) 10 MG tablet Take 1 tablet (10 mg total) by mouth daily.   aspirin EC 81 MG tablet Take 81 mg by mouth daily. Swallow whole.   benazepril-hydrochlorthiazide (LOTENSIN HCT) 20-12.5 MG tablet TAKE 2 TABLETS EVERY DAY   buPROPion (WELLBUTRIN XL) 150 MG 24 hr tablet Take 150 mg by mouth daily. (Patient not taking: Reported on 01/30/2024)   busPIRone (BUSPAR) 30 MG tablet Take one tablet by mouth two times daily   chlorthalidone (HYGROTON) 25 MG tablet TAKE 1 TABLET EVERY DAY   Cyanocobalamin (VITAMIN B-12 PO) Take 2 gummies daily.   ferrous gluconate (FERGON) 324 MG tablet Take 324 mg by mouth daily with breakfast.   FLUoxetine (PROZAC) 40 MG capsule Take 1 capsule (40 mg total) by mouth daily.   Melatonin 1 MG CAPS Takes two 2 mg gummies daily   Multiple Vitamin (MULTIVITAMIN) tablet Take 1 tablet by mouth daily. (Patient not taking: Reported on 01/30/2024)   olmesartan-hydrochlorothiazide (BENICAR HCT) 20-12.5 MG tablet Take 1 tablet by mouth daily.   omeprazole (PRILOSEC) 40 MG capsule Take 40 mg by mouth 2 (two) times daily. (Patient not taking: Reported on 01/30/2024)   OVER THE COUNTER MEDICATION Black seed as needed  (Patient not taking: Reported on 01/30/2024)   polyethylene glycol (MIRALAX / GLYCOLAX) 17 g packet Take 17 g by mouth daily as needed.   rosuvastatin (CRESTOR) 20 MG tablet TAKE 1 TABLET EVERY DAY   sucralfate (CARAFATE) 1 g tablet TAKE 1 TABLET TWICE DAILY   No facility-administered encounter medications on file as of 02/18/2024.     Scribe for Treatment Team: Reuel Boom

## 2024-02-27 ENCOUNTER — Other Ambulatory Visit: Payer: Self-pay | Admitting: Family Medicine

## 2024-02-27 ENCOUNTER — Ambulatory Visit: Payer: Self-pay | Admitting: Professional Counselor

## 2024-02-27 DIAGNOSIS — F4310 Post-traumatic stress disorder, unspecified: Secondary | ICD-10-CM | POA: Diagnosis not present

## 2024-02-27 NOTE — Progress Notes (Signed)
 I have reviewed and agree with the documentation. Milus Mallick. Lodema Hong, MD [facility] Location Provider: office Location Patient: home

## 2024-02-27 NOTE — BH Specialist Note (Signed)
 [place] Virtual BH Telephone Follow-up  MRN: [id number] NAME: Sophia [name] Date: 02/27/24  Start time: Start Time: 1000 End time: Stop Time: 1030 Total time: Total Time in Minutes (Visit): 30 Call number: Visit Number: 3- Third Visit  Reason for call today:  The patient is a 73 year old female who presented for a collaborative care follow-up. She continues to report an overall improvement in mood and attributes much of her progress to the benefits of her current medication regimen.  During this session, the patient shared that her husband was recently hospitalized, which understandably caused some stress. However, she expressed pride in how she managed the situation, noting that she handled it significantly better than she might have in the past. Additionally, she continues to experience ongoing worry related to her son's struggles with active addiction. Despite this, the patient reports she has been more successful in setting healthy boundaries and managing the emotional toll more effectively.  Overall, the patient is showing steady progress within the collaborative care model. She stated that she feels stable at this time and believes she can go approximately a month before the next check-in. A follow-up appointment has been scheduled for one month from now to reassess her status, ensure continued stability, and determine next steps in her care.    PHQ-9 Scores:     02/13/2024   10:07 AM 10/22/2023    9:57 AM 09/23/2023    2:46 PM 04/09/2023    3:23 PM 02/25/2023    3:58 PM  Depression screen PHQ 2/9  Decreased Interest 1 0 0 0 2  Down, Depressed, Hopeless 1 0 0 0 2  PHQ - 2 Score 2 0 0 0 4  Altered sleeping 0   1 3  Tired, decreased energy 1   1 3   Change in appetite 0   0 0  Feeling bad or failure about yourself  1   0 0  Trouble concentrating 1   0 0  Moving slowly or fidgety/restless 0   0 3  Suicidal thoughts 0   0 0  PHQ-9 Score 5   2 13   Difficult doing work/chores  Somewhat difficult   Not difficult at all Somewhat difficult   GAD-7 Scores:     02/13/2024   10:18 AM 01/30/2024    1:38 PM 02/25/2023    3:59 PM 06/04/2022   11:50 AM  GAD 7 : Generalized Anxiety Score  Nervous, Anxious, on Edge 1 3 0 0  Control/stop worrying 1 3 1  0  Worry too much - different things 1 3 1  0  Trouble relaxing 0 3 1 0  Restless 1 2 1  0  Easily annoyed or irritable 1 1 1 1   Afraid - awful might happen 1 3 1 1   Total GAD 7 Score 6 18 6 2   Anxiety Difficulty Somewhat difficult Extremely difficult Somewhat difficult     Stress Current stressors:  Husband and son Sleep:  Good Appetite:  Good Coping ability:  Good Patient taking medications as prescribed:  Yes  Current medications:  Outpatient Encounter Medications as of 02/27/2024  Medication Sig   acetaminophen (TYLENOL) 650 MG CR tablet Take 650 mg by mouth 2 (two) times daily.   albuterol (VENTOLIN HFA) 108 (90 Base) MCG/ACT inhaler Inhale 2 puffs into the lungs every 6 (six) hours as needed for wheezing or shortness of breath.   amLODipine (NORVASC) 10 MG tablet Take 1 tablet (10 mg total) by mouth daily.   aspirin EC 81 MG  tablet Take 81 mg by mouth daily. Swallow whole.   benazepril-hydrochlorthiazide (LOTENSIN HCT) 20-12.5 MG tablet TAKE 2 TABLETS EVERY DAY   buPROPion (WELLBUTRIN XL) 150 MG 24 hr tablet Take 150 mg by mouth daily. (Patient not taking: Reported on 01/30/2024)   busPIRone (BUSPAR) 30 MG tablet Take one tablet by mouth two times daily   chlorthalidone (HYGROTON) 25 MG tablet TAKE 1 TABLET EVERY DAY   Cyanocobalamin (VITAMIN B-12 PO) Take 2 gummies daily.   ferrous gluconate (FERGON) 324 MG tablet Take 324 mg by mouth daily with breakfast.   FLUoxetine (PROZAC) 40 MG capsule Take 1 capsule (40 mg total) by mouth daily.   Melatonin 1 MG CAPS Takes two 2 mg gummies daily   Multiple Vitamin (MULTIVITAMIN) tablet Take 1 tablet by mouth daily. (Patient not taking: Reported on 01/30/2024)    olmesartan-hydrochlorothiazide (BENICAR HCT) 20-12.5 MG tablet Take 1 tablet by mouth daily.   omeprazole (PRILOSEC) 40 MG capsule Take 40 mg by mouth 2 (two) times daily. (Patient not taking: Reported on 01/30/2024)   OVER THE COUNTER MEDICATION Black seed as needed (Patient not taking: Reported on 01/30/2024)   polyethylene glycol (MIRALAX / GLYCOLAX) 17 g packet Take 17 g by mouth daily as needed.   rosuvastatin (CRESTOR) 20 MG tablet TAKE 1 TABLET EVERY DAY   sucralfate (CARAFATE) 1 g tablet TAKE 1 TABLET TWICE DAILY   No facility-administered encounter medications on file as of 02/27/2024.     Self-harm Behaviors Risk Assessment Self-harm risk factors:  None Patient endorses recent thoughts of harming self:  Denies   Danger to Others Risk Assessment Danger to others risk factors:  None Patient endorses recent thoughts of harming others:  Denies    Substance Use Assessment Patient recently consumed alcohol:  No  Alcohol Use Disorder Identification Test (AUDIT):     07/25/2021    2:14 PM 08/13/2022    2:47 PM 10/22/2023    9:58 AM  Alcohol Use Disorder Test (AUDIT)  1. How often do you have a drink containing alcohol? 0 0 0  2. How many drinks containing alcohol do you have on a typical day when you are drinking? 0 0 0  3. How often do you have six or more drinks on one occasion? 0 0 0  AUDIT-C Score 0 0 0   Goals, Interventions and Follow-up Plan Goals: Increase healthy adjustment to current life circumstances Interventions: Supportive Counseling Follow-up Plan:  One month   Reuel Boom

## 2024-03-09 ENCOUNTER — Encounter: Payer: Self-pay | Admitting: Internal Medicine

## 2024-03-09 ENCOUNTER — Ambulatory Visit: Attending: Internal Medicine | Admitting: Internal Medicine

## 2024-03-09 VITALS — BP 114/60 | HR 69 | Ht 69.0 in | Wt 197.0 lb

## 2024-03-09 DIAGNOSIS — R002 Palpitations: Secondary | ICD-10-CM

## 2024-03-09 DIAGNOSIS — E1169 Type 2 diabetes mellitus with other specified complication: Secondary | ICD-10-CM

## 2024-03-09 DIAGNOSIS — I5032 Chronic diastolic (congestive) heart failure: Secondary | ICD-10-CM | POA: Insufficient documentation

## 2024-03-09 DIAGNOSIS — I10 Essential (primary) hypertension: Secondary | ICD-10-CM

## 2024-03-09 DIAGNOSIS — R0602 Shortness of breath: Secondary | ICD-10-CM | POA: Diagnosis not present

## 2024-03-09 DIAGNOSIS — R0609 Other forms of dyspnea: Secondary | ICD-10-CM

## 2024-03-09 MED ORDER — FUROSEMIDE 40 MG PO TABS: 40.0000 mg | ORAL_TABLET | Freq: Every day | ORAL | 3 refills | Status: DC

## 2024-03-09 NOTE — Progress Notes (Signed)
 Cardiology Office Note  Date: 03/09/2024   ID: Sophia, [name] 12/04/1950, MRN [id number]  PCP:  Towanda Fret, MD  Cardiologist:  Lasalle Pointer, MD Electrophysiologist:  None   History of Present Illness: Sophia [name] is a 73 y.o. adult known to have HTN, DM 2, HLD is here for follow-up visit.  Ongoing DOE in the last 4 months, but now improving.  No angina.  No dizziness, syncope, palpitations.  No leg swelling.  No orthopnea or PND.  Due to new DOE, she underwent echocardiogram and Lexiscan  recently.  Echocardiogram showed normal LVEF, G1 DD with elevated left atrial pressure, normal RV function and no valvular heart disease.  CVP was 3 mmHg.  Lexiscan  showed no evidence of ischemia.  Past Medical History:  Diagnosis Date   Acute respiratory disease due to COVID-19 virus 08/28/2019   Anxiety    Arthritis    Depression    Diabetes mellitus type 2 in obese 11/28/2010   Qualifier: Diagnosis of  By: Rodolph Clap MD, Margaret  Diet controlled in 12/2016    Diabetes mellitus without complication (HCC)    GERD (gastroesophageal reflux disease)    Hypercholesteremia    Hypertension    PONV (postoperative nausea and vomiting)    Sleep apnea     Past Surgical History:  Procedure Laterality Date   ABDOMINAL HYSTERECTOMY  11/18/1980   tubes and womb, bleeding and ectopic   ABDOMINAL SURGERY     removal of tumors   BALLOON DILATION N/A 02/16/2021   Procedure: BALLOON DILATION;  Surgeon: Vinetta Greening, DO;  [facility];  Service: Endoscopy;  Laterality: N/A;   BIOPSY  02/16/2021   Procedure: BIOPSY;  Surgeon: Vinetta Greening, DO;  [facility];  Service: Endoscopy;;   BREAST SURGERY Left 11/18/1981   benign tumor   CATARACT EXTRACTION W/PHACO Right 01/11/2014   Procedure: CATARACT EXTRACTION PHACO AND INTRAOCULAR LENS PLACEMENT (IOC);  Surgeon: Lavonia Powers T. Gennie Kicks, MD;  Location: [facility];  Service: Ophthalmology;  Laterality: Right;  CDE 5.57    CATARACT EXTRACTION W/PHACO Left 01/25/2014   Procedure: CATARACT EXTRACTION PHACO AND INTRAOCULAR LENS PLACEMENT (IOC);  Surgeon: Lavonia Powers T. Gennie Kicks, MD;  Location: [facility];  Service: Ophthalmology;  Laterality: Left;  CDE:5.19   COLONOSCOPY  2007 [facility]   NL EXAM   COLONOSCOPY N/A 04/22/2014   Dr. Fields:Normal mucosa in the terminal ileum/Two COLON polyps REMOVED/ Mild diverticulosis in the ascending colon and transverse colon/The LEFT colon IS redundant/Small internal hemorrhoids. Path: tubular adenoma. Next colonoscopy in 5-10 years   COLONOSCOPY WITH PROPOFOL  N/A 11/27/2020   Surgeon: Vinetta Greening, DO;  Nonbleeding internal hemorrhoids, one 6 mm transverse colon polyp resected, diverticulosis in the sigmoid colon.  Pathology with tubular adenoma.  Repeat in 5 years.   ESOPHAGOGASTRODUODENOSCOPY N/A 04/22/2014   Dr. Fields:MICROCYTIC ANEMIA MOST LIKELY DUE TO ASA/VOLTAREN /Small hiatal hernia/MODERATE Non-erosive gastritis. Negative H.pylori   ESOPHAGOGASTRODUODENOSCOPY (EGD) WITH PROPOFOL  N/A 02/16/2021   Surgeon: Vinetta Greening, DO; benign-appearing esophageal stenosis s/p dilation, gastritis biopsied.  Biopsies were benign.   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  Gennie Kicks, MD;  Location: [facility];  Service: Ophthalmology;  Laterality: Right;   YAG LASER APPLICATION Left 08/15/2015   Procedure: YAG LASER APPLICATION;  Surgeon: Albert Huff, MD;  Location: [facility];  Service: Ophthalmology;  Laterality: Left;    Current Outpatient Medications  Medication Sig Dispense Refill   acetaminophen  (TYLENOL ) 650 MG CR tablet Take 650 mg by mouth 2 (two) times daily.     albuterol  (VENTOLIN  HFA) 108 (90 Base) MCG/ACT inhaler Inhale 2 puffs into the lungs every 6 (six) hours as needed for wheezing or shortness of breath. 8 g 0   amLODipine  (NORVASC ) 10 MG tablet Take 1 tablet (10 mg total) by mouth daily. 90 tablet 3   aspirin  EC 81 MG tablet Take 81 mg by mouth daily. Swallow whole.     benazepril -hydrochlorthiazide (LOTENSIN  HCT) 20-12.5 MG tablet TAKE 2 TABLETS EVERY DAY 180 tablet 3   busPIRone  (BUSPAR ) 30 MG tablet Take one tablet by mouth two times daily 60 tablet 3   chlorthalidone  (HYGROTON ) 25 MG tablet TAKE 1 TABLET EVERY DAY 90 tablet 3   Cyanocobalamin  (VITAMIN B-12 PO) Take 2 gummies daily.     ferrous gluconate (FERGON) 324 MG tablet Take 324 mg by mouth daily with breakfast.     FLUoxetine  (PROZAC ) 40 MG capsule Take 1 capsule (40 mg total) by mouth daily. 90 capsule 3   furosemide  (LASIX ) 40 MG tablet Take 1 tablet (40 mg total) by mouth daily. 30 tablet 3   Melatonin 1 MG CAPS Takes two 2 mg gummies daily     Multiple Vitamin (MULTIVITAMIN) tablet Take 1 tablet by mouth daily.     olmesartan -hydrochlorothiazide  (BENICAR  HCT) 20-12.5 MG tablet Take 1 tablet by mouth daily. 30 tablet 11   omeprazole  (PRILOSEC) 40 MG capsule TAKE 1 CAPSULE TWICE DAILY FOR 2 MONTH ONLY 180 capsule 3   OVER THE COUNTER MEDICATION Black seed as needed     polyethylene glycol (MIRALAX / GLYCOLAX) 17 g packet Take 17 g by mouth daily as needed.     rosuvastatin  (CRESTOR ) 20 MG tablet TAKE 1 TABLET EVERY DAY 90 tablet 3   sucralfate  (CARAFATE ) 1 g tablet TAKE 1 TABLET TWICE  DAILY 180 tablet 3   No current facility-administered medications for this visit.   Allergies:  Patient has no known allergies.   Social History: The patient  reports that she quit smoking about 34 years ago. Her smoking use included cigarettes. She started smoking about 54 years ago. She has a 5 pack-year smoking history. She quit smokeless tobacco use about 1 years ago.  Her smokeless tobacco use included snuff. She reports current drug use. Drug: Marijuana. She reports that she does not drink alcohol.   Family History: The patient's family history includes Arthritis in her father; CAD in her paternal grandmother; Cancer in her father; Colon polyps (age of onset: 102) in her sister; Diabetes in her sister and sister; Hypertension in her father, sister, and sister; Kidney disease in her sister.   ROS:  Please see the history of present illness. Otherwise, complete review of systems is positive for none  All other systems are  reviewed and negative.   Physical Exam: VS:  BP 114/60   Pulse 69   Ht 5\' 9"  (1.753 m)   Wt 197 lb (89.4 kg)   SpO2 94%   BMI 29.09 kg/m , BMI Body mass index is 29.09 kg/m.  Wt Readings from Last 3 Encounters:  03/09/24 197 lb (89.4 kg)  01/30/24 198 lb 1.9 oz (89.9 kg)  01/27/24 199 lb (90.3 kg)    General: Patient appears comfortable at rest. HEENT: Conjunctiva and lids normal, oropharynx clear with moist mucosa. Neck: Supple, no elevated JVP or carotid bruits, no thyromegaly. Lungs: Clear to auscultation, nonlabored breathing at rest. Cardiac: Regular rate and rhythm, no S3 or significant systolic murmur, no pericardial rub. Abdomen: Soft, nontender, no hepatomegaly, bowel sounds present, no guarding or rebound. Extremities: No pitting edema, distal pulses 2+. Skin: Warm and dry. Musculoskeletal: No kyphosis. Neuropsychiatric: Alert and oriented x3, affect grossly appropriate.  Recent Labwork: 02/06/2024: ALT 15; AST 18; BUN 33; Creatinine, Ser 1.14;  Hemoglobin 12.4; Platelets 222; Potassium 4.0; Sodium 145; TSH 2.320     Component Value Date/Time   CHOL 120 02/06/2024 1048   TRIG 73 02/06/2024 1048   HDL 58 02/06/2024 1048   CHOLHDL 2.1 02/06/2024 1048   CHOLHDL 2.8 09/01/2020 0941   VLDL 53 (H) 06/23/2018 0352   LDLCALC 47 02/06/2024 1048   LDLCALC 97 09/01/2020 0941     Assessment and Plan:  Chronic diastolic heart failure: Ongoing DOE for the last 4 months but improving.  She gets short of breath with exertional activities.  No leg swelling.  No orthopnea or PND.  Echocardiogram in April 25 showed normal LVEF, G1 DD with elevated left atrial pressure, normal RV function, no valvular heart disease, CVP 3 mmHg. Start p.o. Lasix  40 mg once daily. Obtain BMP in 5 days and BNP today.  LHC in 2019 showed angiographically normal coronaries.  Preop cardiac risk stratification for wedge resection/lung nodule: Patient denies having any angina. She has been having SOB with exertion in the last 4 months but now improving.  Echocardiogram showed normal LVEF, G1 DD with elevated left atrial pressure but CVP 3 mmHg. She probably has exertional diastolic dysfunction. Diuretics should improve her symptoms.  She underwent LHC in 2019 that showed angiographically normal coronaries.  Repeat Lexiscan  in April 2025 did not reveal any evidence of ischemia.  She is at a low risk for any perioperative cardiac complications.  HTN, controlled: Continue current antihypertensives.  HTN management per PCP.      Medication Adjustments/Labs and Tests Ordered: Current medicines are reviewed at length with the patient today.  Concerns regarding medicines are outlined above.    Disposition:  Follow up  3 months  Signed Icel Castles Priya Aprile Dickenson, MD, 03/09/2024 4:43 PM    [facility] at [facility] [address] [place], [place], [place] [address]

## 2024-03-09 NOTE — Patient Instructions (Addendum)
 3 momMedication Instructions:  Your physician has recommended you make the following change in your medication:  Start Lasix  40 mg once daily  Continue taking all other medications as prescribed   Labwork: BNP today at [facility] In one week BMET at [facility]  Testing/Procedures: None  Follow-Up: Your physician recommends that you schedule a follow-up appointment in: 3 months  Any Other Special Instructions Will Be Listed Below (If Applicable). Thank you for choosing [facility]!     If you need a refill on your cardiac medications before your next appointment, please call your pharmacy.

## 2024-03-10 ENCOUNTER — Telehealth: Payer: Self-pay | Admitting: Internal Medicine

## 2024-03-10 ENCOUNTER — Encounter: Payer: Self-pay | Admitting: *Deleted

## 2024-03-10 ENCOUNTER — Other Ambulatory Visit: Payer: Self-pay | Admitting: *Deleted

## 2024-03-10 DIAGNOSIS — R911 Solitary pulmonary nodule: Secondary | ICD-10-CM

## 2024-03-10 NOTE — Telephone Encounter (Signed)
 Patient inquiring about medication that was sent in advise her receipt of [place]. She went back in and they did indeed have it. Wanted to clarify when she should have other labs done was having surgery on 04/30. Advised didn't have to be done exactly on that day can do on the day before or after as long as she completes in a week. Need to check kidney function after starting Lasix . Patient verbalized understanding

## 2024-03-10 NOTE — Telephone Encounter (Signed)
 Pt is requesting a callback regarding her wanting to know about next of labs being done. She was told on the 30th but that's her procedure date. Please advise.

## 2024-03-11 LAB — BRAIN NATRIURETIC PEPTIDE: BNP: 11.6 pg/mL (ref 0.0–100.0)

## 2024-03-12 NOTE — Progress Notes (Signed)
 Surgical Instructions   Your procedure is scheduled on Wednesday, April 30th. Report to [facility] Main Entrance "A" at 9:45 A.M., then check in with the Admitting office. Any questions or running late day of surgery: call [phone]  Questions prior to your surgery date: call [phone], Monday-Friday, 8am-4pm. If you experience any cold or flu symptoms such as cough, fever, chills, shortness of breath, etc. between now and your scheduled surgery, please notify us  at the above number.     Remember:  Do not eat or drink after midnight the night before your surgery   Take these medicines the morning of surgery with A SIP OF WATER   amLODipine  (NORVASC )  ASPIRIN   busPIRone  (BUSPAR )  FLUoxetine  (PROZAC )  omeprazole  (PRILOSEC)  rosuvastatin  (CRESTOR )   May take these medicines IF NEEDED: acetaminophen  (TYLENOL )  albuterol  (VENTOLIN  HFA) inhaler - please bring with you on day of surgery   One week prior to surgery, STOP taking any Aspirin  (unless otherwise instructed by your surgeon) Aleve, Naproxen, Ibuprofen , Motrin , Advil , Goody's, BC's, all herbal medications, fish oil, and non-prescription vitamins.                     Do NOT Smoke (Tobacco/Vaping) for 24 hours prior to your procedure.  If you use a CPAP at night, you may bring your mask/headgear for your overnight stay.   You will be asked to remove any contacts, glasses, piercing's, hearing aid's, dentures/partials prior to surgery. Please bring cases for these items if needed.    Patients discharged the day of surgery will not be allowed to drive home, and someone needs to stay with them for 24 hours.  SURGICAL WAITING ROOM VISITATION Patients may have no more than 2 support people in the waiting area - these visitors may rotate.   Pre-op nurse will coordinate an appropriate time for 1 ADULT support person, who may not rotate, to accompany patient in pre-op.  Children under the age of 11 must have an adult with them who  is not the patient and must remain in the main waiting area with an adult.  If the patient needs to stay at the hospital during part of their recovery, the visitor guidelines for inpatient rooms apply.  Please refer to the [facility] website for the visitor guidelines for any additional information.   If you received a COVID test during your pre-op visit  it is requested that you wear a mask when out in public, stay away from anyone that may not be feeling well and notify your surgeon if you develop symptoms. If you have been in contact with anyone that has tested positive in the last 10 days please notify you surgeon.      Pre-operative CHG Bathing Instructions   You can play a key role in reducing the risk of infection after surgery. Your skin needs to be as free of germs as possible. You can reduce the number of germs on your skin by washing with CHG (chlorhexidine  gluconate) soap before surgery. CHG is an antiseptic soap that kills germs and continues to kill germs even after washing.   DO NOT use if you have an allergy to chlorhexidine /CHG or antibacterial soaps. If your skin becomes reddened or irritated, stop using the CHG and notify one of our RNs at [phone].              TAKE A SHOWER THE NIGHT BEFORE SURGERY AND THE DAY OF SURGERY    Please keep in mind  the following:  DO NOT shave, including legs and underarms, 48 hours prior to surgery.   You may shave your face before/day of surgery.  Place clean sheets on your bed the night before surgery Use a clean washcloth (not used since being washed) for each shower. DO NOT sleep with pet's night before surgery.  CHG Shower Instructions:  Wash your face and private area with normal soap. If you choose to wash your hair, wash first with your normal shampoo.  After you use shampoo/soap, rinse your hair and body thoroughly to remove shampoo/soap residue.  Turn the water  OFF and apply half the bottle of CHG soap to a CLEAN  washcloth.  Apply CHG soap ONLY FROM YOUR NECK DOWN TO YOUR TOES (washing for 3-5 minutes)  DO NOT use CHG soap on face, private areas, open wounds, or sores.  Pay special attention to the area where your surgery is being performed.  If you are having back surgery, having someone wash your back for you may be helpful. Wait 2 minutes after CHG soap is applied, then you may rinse off the CHG soap.  Pat dry with a clean towel  Put on clean pajamas    Additional instructions for the day of surgery: DO NOT APPLY any lotions, deodorants, cologne, or perfumes.   Do not wear jewelry or makeup Do not wear nail polish, gel polish, artificial nails, or any other type of covering on natural nails (fingers and toes) Do not bring valuables to the hospital. [facility] is not responsible for valuables/personal belongings. Put on clean/comfortable clothes.  Please brush your teeth.  Ask your nurse before applying any prescription medications to the skin.

## 2024-03-15 ENCOUNTER — Telehealth: Payer: Self-pay

## 2024-03-15 ENCOUNTER — Encounter: Payer: Self-pay | Admitting: *Deleted

## 2024-03-15 ENCOUNTER — Encounter (HOSPITAL_COMMUNITY)
Admission: RE | Admit: 2024-03-15 | Discharge: 2024-03-15 | Disposition: A | Discharge status: Home / Self Care | Source: Ambulatory Visit | Attending: Thoracic Surgery (Cardiothoracic Vascular Surgery) | Admitting: Thoracic Surgery (Cardiothoracic Vascular Surgery)

## 2024-03-15 DIAGNOSIS — E1169 Type 2 diabetes mellitus with other specified complication: Secondary | ICD-10-CM | POA: Diagnosis not present

## 2024-03-15 DIAGNOSIS — R0609 Other forms of dyspnea: Secondary | ICD-10-CM | POA: Diagnosis not present

## 2024-03-15 NOTE — Telephone Encounter (Signed)
-----   Message from [facility] B sent at 03/12/2024  1:34 PM EDT -----  ----- Message ----- From: Lasalle Pointer, MD Sent: 03/12/2024   1:29 PM EDT To: Casper Clement, CMA  BNP normal. Not volume overloaded.

## 2024-03-15 NOTE — Progress Notes (Signed)
 Surgical Instructions   Your procedure is scheduled on Wednesday, April 30th. Report to [facility] Main Entrance "A" at 9:45 A.M., then check in with the Admitting office. Any questions or running late day of surgery: call [phone]  Questions prior to your surgery date: call [phone], Monday-Friday, 8am-4pm. If you experience any cold or flu symptoms such as cough, fever, chills, shortness of breath, etc. between now and your scheduled surgery, please notify us  at the above number.     Remember:  Do not eat or drink after midnight the night before your surgery   Take these medicines the morning of surgery with A SIP OF WATER   amLODipine  (NORVASC )  busPIRone  (BUSPAR )  FLUoxetine  (PROZAC )  omeprazole  (PRILOSEC)  rosuvastatin  (CRESTOR )   May take these medicines IF NEEDED: acetaminophen  (TYLENOL )  albuterol  (VENTOLIN  HFA) inhaler - please bring with you on day of surgery   Follow your surgeon's instructions when to STOP Aspirin .  If no instructions were given by your surgeon, then you will need to call the office to obtain instructions.    One week prior to surgery, STOP taking any Aspirin  (unless otherwise instructed by your surgeon) Aleve, Naproxen, Ibuprofen , Motrin , Advil , Goody's, BC's, all herbal medications, fish oil, and non-prescription vitamins.                     Do NOT Smoke (Tobacco/Vaping) for 24 hours prior to your procedure.  If you use a CPAP at night, you may bring your mask/headgear for your overnight stay.   You will be asked to remove any contacts, glasses, piercing's, hearing aid's, dentures/partials prior to surgery. Please bring cases for these items if needed.    Patients discharged the day of surgery will not be allowed to drive home, and someone needs to stay with them for 24 hours.  SURGICAL WAITING ROOM VISITATION Patients may have no more than 2 support people in the waiting area - these visitors may rotate.   Pre-op nurse will coordinate  an appropriate time for 1 ADULT support person, who may not rotate, to accompany patient in pre-op.  Children under the age of 53 must have an adult with them who is not the patient and must remain in the main waiting area with an adult.  If the patient needs to stay at the hospital during part of their recovery, the visitor guidelines for inpatient rooms apply.  Please refer to the [facility] website for the visitor guidelines for any additional information.   If you received a COVID test during your pre-op visit  it is requested that you wear a mask when out in public, stay away from anyone that may not be feeling well and notify your surgeon if you develop symptoms. If you have been in contact with anyone that has tested positive in the last 10 days please notify you surgeon.      Pre-operative CHG Bathing Instructions   You can play a key role in reducing the risk of infection after surgery. Your skin needs to be as free of germs as possible. You can reduce the number of germs on your skin by washing with CHG (chlorhexidine  gluconate) soap before surgery. CHG is an antiseptic soap that kills germs and continues to kill germs even after washing.   DO NOT use if you have an allergy to chlorhexidine /CHG or antibacterial soaps. If your skin becomes reddened or irritated, stop using the CHG and notify one of our RNs at [phone].  TAKE A SHOWER THE NIGHT BEFORE SURGERY AND THE DAY OF SURGERY    Please keep in mind the following:  DO NOT shave, including legs and underarms, 48 hours prior to surgery.   You may shave your face before/day of surgery.  Place clean sheets on your bed the night before surgery Use a clean washcloth (not used since being washed) for each shower. DO NOT sleep with pet's night before surgery.  CHG Shower Instructions:  Wash your face and private area with normal soap. If you choose to wash your hair, wash first with your normal shampoo.  After you  use shampoo/soap, rinse your hair and body thoroughly to remove shampoo/soap residue.  Turn the water  OFF and apply half the bottle of CHG soap to a CLEAN washcloth.  Apply CHG soap ONLY FROM YOUR NECK DOWN TO YOUR TOES (washing for 3-5 minutes)  DO NOT use CHG soap on face, private areas, open wounds, or sores.  Pay special attention to the area where your surgery is being performed.  If you are having back surgery, having someone wash your back for you may be helpful. Wait 2 minutes after CHG soap is applied, then you may rinse off the CHG soap.  Pat dry with a clean towel  Put on clean pajamas    Additional instructions for the day of surgery: DO NOT APPLY any lotions, deodorants, cologne, or perfumes.   Do not wear jewelry or makeup Do not wear nail polish, gel polish, artificial nails, or any other type of covering on natural nails (fingers and toes) Do not bring valuables to the hospital. [facility] is not responsible for valuables/personal belongings. Put on clean/comfortable clothes.  Please brush your teeth.  Ask your nurse before applying any prescription medications to the skin.

## 2024-03-15 NOTE — Telephone Encounter (Signed)
 The patient has been notified of the result and verbalized understanding.  All questions (if any) were answered. Sophia [name], [place] 03/15/2024 9:05 AM

## 2024-03-15 NOTE — Progress Notes (Signed)
 Pt arrived to PAT appt very tearful. Patient shared that her husband passed away yesterday unexpectedly. Dr. Audree Bless office notified, new surgery date requested.

## 2024-03-16 LAB — BASIC METABOLIC PANEL WITH GFR
BUN/Creatinine Ratio: 29 — ABNORMAL HIGH (ref 12–28)
BUN: 42 mg/dL — ABNORMAL HIGH (ref 8–27)
CO2: 23 mmol/L (ref 20–29)
Calcium: 10.8 mg/dL — ABNORMAL HIGH (ref 8.7–10.3)
Chloride: 102 mmol/L (ref 96–106)
Creatinine, Ser: 1.47 mg/dL — ABNORMAL HIGH (ref 0.57–1.00)
Glucose: 115 mg/dL — ABNORMAL HIGH (ref 70–99)
Potassium: 3.5 mmol/L (ref 3.5–5.2)
Sodium: 143 mmol/L (ref 134–144)
eGFR: 37 mL/min/{1.73_m2} — ABNORMAL LOW (ref >59)

## 2024-03-23 ENCOUNTER — Telehealth: Payer: Self-pay

## 2024-03-23 DIAGNOSIS — I5032 Chronic diastolic (congestive) heart failure: Secondary | ICD-10-CM

## 2024-03-23 DIAGNOSIS — Z79899 Other long term (current) drug therapy: Secondary | ICD-10-CM

## 2024-03-23 NOTE — Telephone Encounter (Signed)
-----   Message from Vishnu P Mallipeddi sent at 03/22/2024 10:52 AM EDT ----- Serum creatinine elevated. Stop Lasix . Obtain BMP in 5 days/

## 2024-03-23 NOTE — Telephone Encounter (Signed)
 The patient has been notified of the result and verbalized understanding.  All questions (if any) were answered. Advised her that will mail out labs orders to her and she can have them completed after her surgery.  Camilo Cella, [place] 03/23/2024 4:27 PM

## 2024-03-25 ENCOUNTER — Telehealth: Payer: Self-pay | Admitting: Family Medicine

## 2024-03-25 MED ORDER — TRAZODONE HCL 50 MG PO TABS: 25.0000 mg | ORAL_TABLET | Freq: Every evening | ORAL | 1 refills | Status: DC | PRN

## 2024-03-25 NOTE — Telephone Encounter (Signed)
 Copied from CRM [id number]. Topic: Clinical - Medication Question >> Mar 25, 2024 10:25 AM Carlatta H wrote: Reason for CRM: Patients Niece called to see if patients medication for busPIRone  (BUSPAR ) 30 MG tablet [[id number]] can be moved up in dosage due to patient being under stress due to death of husband//Please call to advise [facility] [phone]

## 2024-03-26 NOTE — Telephone Encounter (Signed)
 I spoke with pt 1 day ago and expressed condolence and prescribed trazodone 

## 2024-03-26 NOTE — Pre-Procedure Instructions (Signed)
 Surgical Instructions   Your procedure is scheduled on Mar 31, 2024. Report to [facility] Main Entrance "A" at 6:30 A.M., then check in with the Admitting office. Any questions or running late day of surgery: call [phone]  Questions prior to your surgery date: call [phone], Monday-Friday, 8am-4pm. If you experience any cold or flu symptoms such as cough, fever, chills, shortness of breath, etc. between now and your scheduled surgery, please notify us  at the above number.     Remember:  Do not eat or drink after midnight the night before your surgery    Take these medicines the morning of surgery with A SIP OF WATER : acetaminophen  (TYLENOL )  amLODipine  (NORVASC )  busPIRone  (BUSPAR )  FLUoxetine  (PROZAC )  omeprazole  (PRILOSEC)  rosuvastatin  (CRESTOR )  sucralfate  (CARAFATE )    May take these medicines IF NEEDED: albuterol  (VENTOLIN  HFA) inhaler - please bring inhaler with you morning of surgery   Continue taking your Aspirin  through the day before surgery. DO NOT take any the morning of surgery   One week prior to surgery, STOP taking any Aspirin  (unless otherwise instructed by your surgeon) Aleve, Naproxen, Ibuprofen , Motrin , Advil , Goody's, BC's, all herbal medications, fish oil, and non-prescription vitamins.                     Do NOT Smoke (Tobacco/Vaping) for 24 hours prior to your procedure.  If you use a CPAP at night, you may bring your mask/headgear for your overnight stay.   You will be asked to remove any contacts, glasses, piercing's, hearing aid's, dentures/partials prior to surgery. Please bring cases for these items if needed.    Patients discharged the day of surgery will not be allowed to drive home, and someone needs to stay with them for 24 hours.  SURGICAL WAITING ROOM VISITATION Patients may have no more than 2 support people in the waiting area - these visitors may rotate.   Pre-op nurse will coordinate an appropriate time for 1 ADULT support  person, who may not rotate, to accompany patient in pre-op.  Children under the age of 60 must have an adult with them who is not the patient and must remain in the main waiting area with an adult.  If the patient needs to stay at the hospital during part of their recovery, the visitor guidelines for inpatient rooms apply.  Please refer to the [facility] website for the visitor guidelines for any additional information.   If you received a COVID test during your pre-op visit  it is requested that you wear a mask when out in public, stay away from anyone that may not be feeling well and notify your surgeon if you develop symptoms. If you have been in contact with anyone that has tested positive in the last 10 days please notify you surgeon.      Pre-operative CHG Bathing Instructions   You can play a key role in reducing the risk of infection after surgery. Your skin needs to be as free of germs as possible. You can reduce the number of germs on your skin by washing with CHG (chlorhexidine  gluconate) soap before surgery. CHG is an antiseptic soap that kills germs and continues to kill germs even after washing.   DO NOT use if you have an allergy to chlorhexidine /CHG or antibacterial soaps. If your skin becomes reddened or irritated, stop using the CHG and notify one of our RNs at [phone].  TAKE A SHOWER THE NIGHT BEFORE SURGERY AND THE DAY OF SURGERY    Please keep in mind the following:  DO NOT shave, including legs and underarms, 48 hours prior to surgery.   You may shave your face before/day of surgery.  Place clean sheets on your bed the night before surgery Use a clean washcloth (not used since being washed) for each shower. DO NOT sleep with pet's night before surgery.  CHG Shower Instructions:  Wash your face and private area with normal soap. If you choose to wash your hair, wash first with your normal shampoo.  After you use shampoo/soap, rinse your hair and  body thoroughly to remove shampoo/soap residue.  Turn the water  OFF and apply half the bottle of CHG soap to a CLEAN washcloth.  Apply CHG soap ONLY FROM YOUR NECK DOWN TO YOUR TOES (washing for 3-5 minutes)  DO NOT use CHG soap on face, private areas, open wounds, or sores.  Pay special attention to the area where your surgery is being performed.  If you are having back surgery, having someone wash your back for you may be helpful. Wait 2 minutes after CHG soap is applied, then you may rinse off the CHG soap.  Pat dry with a clean towel  Put on clean pajamas    Additional instructions for the day of surgery: DO NOT APPLY any lotions, deodorants, cologne, or perfumes.   Do not wear jewelry or makeup Do not wear nail polish, gel polish, artificial nails, or any other type of covering on natural nails (fingers and toes) Do not bring valuables to the hospital. [facility] is not responsible for valuables/personal belongings. Put on clean/comfortable clothes.  Please brush your teeth.  Ask your nurse before applying any prescription medications to the skin.

## 2024-03-29 ENCOUNTER — Other Ambulatory Visit: Payer: Self-pay

## 2024-03-29 ENCOUNTER — Ambulatory Visit (HOSPITAL_COMMUNITY)
Admission: RE | Admit: 2024-03-29 | Discharge: 2024-03-29 | Disposition: A | Discharge status: Home / Self Care | Source: Ambulatory Visit | Attending: Thoracic Surgery (Cardiothoracic Vascular Surgery) | Admitting: Thoracic Surgery (Cardiothoracic Vascular Surgery)

## 2024-03-29 ENCOUNTER — Encounter (HOSPITAL_COMMUNITY): Payer: Self-pay

## 2024-03-29 ENCOUNTER — Encounter (HOSPITAL_COMMUNITY)
Admission: RE | Admit: 2024-03-29 | Discharge: 2024-03-29 | Disposition: A | Discharge status: Home / Self Care | Source: Ambulatory Visit | Attending: Thoracic Surgery (Cardiothoracic Vascular Surgery) | Admitting: Thoracic Surgery (Cardiothoracic Vascular Surgery)

## 2024-03-29 VITALS — BP 137/57 | HR 69 | Temp 98.3°F | Resp 17 | Ht 69.0 in | Wt 197.5 lb

## 2024-03-29 DIAGNOSIS — R918 Other nonspecific abnormal finding of lung field: Secondary | ICD-10-CM | POA: Diagnosis not present

## 2024-03-29 DIAGNOSIS — E785 Hyperlipidemia, unspecified: Secondary | ICD-10-CM | POA: Diagnosis not present

## 2024-03-29 DIAGNOSIS — K219 Gastro-esophageal reflux disease without esophagitis: Secondary | ICD-10-CM | POA: Diagnosis present

## 2024-03-29 DIAGNOSIS — R911 Solitary pulmonary nodule: Secondary | ICD-10-CM | POA: Insufficient documentation

## 2024-03-29 DIAGNOSIS — Z79899 Other long term (current) drug therapy: Secondary | ICD-10-CM | POA: Diagnosis not present

## 2024-03-29 DIAGNOSIS — J982 Interstitial emphysema: Secondary | ICD-10-CM | POA: Diagnosis present

## 2024-03-29 DIAGNOSIS — Z87891 Personal history of nicotine dependence: Secondary | ICD-10-CM | POA: Insufficient documentation

## 2024-03-29 DIAGNOSIS — D649 Anemia, unspecified: Secondary | ICD-10-CM | POA: Insufficient documentation

## 2024-03-29 DIAGNOSIS — Z90710 Acquired absence of both cervix and uterus: Secondary | ICD-10-CM | POA: Diagnosis not present

## 2024-03-29 DIAGNOSIS — I10 Essential (primary) hypertension: Secondary | ICD-10-CM | POA: Diagnosis present

## 2024-03-29 DIAGNOSIS — R0989 Other specified symptoms and signs involving the circulatory and respiratory systems: Secondary | ICD-10-CM | POA: Diagnosis not present

## 2024-03-29 DIAGNOSIS — I700 Atherosclerosis of aorta: Secondary | ICD-10-CM | POA: Insufficient documentation

## 2024-03-29 DIAGNOSIS — J9811 Atelectasis: Secondary | ICD-10-CM | POA: Diagnosis present

## 2024-03-29 DIAGNOSIS — Z01812 Encounter for preprocedural laboratory examination: Secondary | ICD-10-CM | POA: Insufficient documentation

## 2024-03-29 DIAGNOSIS — M160 Bilateral primary osteoarthritis of hip: Secondary | ICD-10-CM | POA: Diagnosis present

## 2024-03-29 DIAGNOSIS — E119 Type 2 diabetes mellitus without complications: Secondary | ICD-10-CM | POA: Insufficient documentation

## 2024-03-29 DIAGNOSIS — F418 Other specified anxiety disorders: Secondary | ICD-10-CM | POA: Diagnosis present

## 2024-03-29 DIAGNOSIS — J439 Emphysema, unspecified: Secondary | ICD-10-CM | POA: Diagnosis not present

## 2024-03-29 DIAGNOSIS — G4733 Obstructive sleep apnea (adult) (pediatric): Secondary | ICD-10-CM | POA: Insufficient documentation

## 2024-03-29 DIAGNOSIS — J939 Pneumothorax, unspecified: Secondary | ICD-10-CM | POA: Diagnosis not present

## 2024-03-29 DIAGNOSIS — I2510 Atherosclerotic heart disease of native coronary artery without angina pectoris: Secondary | ICD-10-CM | POA: Diagnosis present

## 2024-03-29 DIAGNOSIS — Z8616 Personal history of COVID-19: Secondary | ICD-10-CM | POA: Diagnosis not present

## 2024-03-29 DIAGNOSIS — Z7982 Long term (current) use of aspirin: Secondary | ICD-10-CM | POA: Diagnosis not present

## 2024-03-29 DIAGNOSIS — E7800 Pure hypercholesterolemia, unspecified: Secondary | ICD-10-CM | POA: Diagnosis present

## 2024-03-29 DIAGNOSIS — C3431 Malignant neoplasm of lower lobe, right bronchus or lung: Secondary | ICD-10-CM | POA: Diagnosis present

## 2024-03-29 DIAGNOSIS — I517 Cardiomegaly: Secondary | ICD-10-CM | POA: Insufficient documentation

## 2024-03-29 DIAGNOSIS — Z48813 Encounter for surgical aftercare following surgery on the respiratory system: Secondary | ICD-10-CM | POA: Diagnosis not present

## 2024-03-29 DIAGNOSIS — J90 Pleural effusion, not elsewhere classified: Secondary | ICD-10-CM | POA: Diagnosis not present

## 2024-03-29 DIAGNOSIS — Z01818 Encounter for other preprocedural examination: Secondary | ICD-10-CM | POA: Diagnosis not present

## 2024-03-29 DIAGNOSIS — Z4682 Encounter for fitting and adjustment of non-vascular catheter: Secondary | ICD-10-CM | POA: Diagnosis not present

## 2024-03-29 DIAGNOSIS — C3490 Malignant neoplasm of unspecified part of unspecified bronchus or lung: Secondary | ICD-10-CM | POA: Insufficient documentation

## 2024-03-29 LAB — COMPREHENSIVE METABOLIC PANEL WITH GFR
ALT: 20 U/L (ref 0–44)
AST: 19 U/L (ref 15–41)
Albumin: 4.1 g/dL (ref 3.5–5.0)
Alkaline Phosphatase: 48 U/L (ref 38–126)
Anion gap: 9 (ref 5–15)
BUN: 35 mg/dL — ABNORMAL HIGH (ref 8–23)
CO2: 30 mmol/L (ref 22–32)
Calcium: 9.8 mg/dL (ref 8.9–10.3)
Chloride: 104 mmol/L (ref 98–111)
Creatinine, Ser: 1.19 mg/dL — ABNORMAL HIGH (ref 0.44–1.00)
GFR, Estimated: 48 mL/min — ABNORMAL LOW (ref >60)
Glucose, Bld: 136 mg/dL — ABNORMAL HIGH (ref 70–99)
Potassium: 3.9 mmol/L (ref 3.5–5.1)
Sodium: 143 mmol/L (ref 135–145)
Total Bilirubin: 0.7 mg/dL (ref 0.0–1.2)
Total Protein: 7.3 g/dL (ref 6.5–8.1)

## 2024-03-29 LAB — URINALYSIS, ROUTINE W REFLEX MICROSCOPIC
Bilirubin Urine: NEGATIVE
Glucose, UA: NEGATIVE mg/dL
Ketones, ur: NEGATIVE mg/dL
Nitrite: NEGATIVE
Protein, ur: NEGATIVE mg/dL
Specific Gravity, Urine: 1.017 (ref 1.005–1.030)
pH: 6 (ref 5.0–8.0)

## 2024-03-29 LAB — CBC
HCT: 36.5 % (ref 36.0–46.0)
Hemoglobin: 11.5 g/dL — ABNORMAL LOW (ref 12.0–15.0)
MCH: 26.3 pg (ref 26.0–34.0)
MCHC: 31.5 g/dL (ref 30.0–36.0)
MCV: 83.3 fL (ref 80.0–100.0)
Platelets: 215 10*3/uL (ref 150–400)
RBC: 4.38 MIL/uL (ref 3.87–5.11)
RDW: 16.5 % — ABNORMAL HIGH (ref 11.5–15.5)
WBC: 6.2 10*3/uL (ref 4.0–10.5)
nRBC: 0 % (ref 0.0–0.2)

## 2024-03-29 LAB — SURGICAL PCR SCREEN
MRSA, PCR: NEGATIVE
Staphylococcus aureus: NEGATIVE

## 2024-03-29 LAB — PROTIME-INR
INR: 1 (ref 0.8–1.2)
Prothrombin Time: 13.5 s (ref 11.4–15.2)

## 2024-03-29 LAB — TYPE AND SCREEN
ABO/RH(D): B POS
Antibody Screen: NEGATIVE

## 2024-03-29 LAB — APTT: aPTT: 29 s (ref 24–36)

## 2024-03-29 NOTE — Progress Notes (Signed)
 Altamese Ates, RN with [facility] office, made aware of abnormal UA results

## 2024-03-29 NOTE — Progress Notes (Signed)
 PCP - Dr. Alberteen Huge Cardiologist - Dr. Vishnu Mallipeddi - last office visit 03/09/2024  PPM/ICD - Denies Device Orders - n/a Rep Notified - n/a  Chest x-ray - 03/29/2024 EKG - 03/09/2024 Stress Test - 02/17/2024 ECHO - 02/17/2024 Cardiac Cath - 06/23/2017  Sleep Study - +OSA but pt has not worn her CPAP in 15+ years (unable to tolerate it)  Pt states she is Pre-DM, but last A1c 6.5 on 02/06/2024. She is not on any medication for her DM. She has a meter at home and checks her blood sugar about every other week. Fasting glucose unknown.  Last dose of GLP1 agonist-  n/a GLP1 instructions: n/a  Blood Thinner Instructions: n/a Aspirin  Instructions: Pt instructed to continue taking ASA through the day before surgery and NONE the morning of surgery  NPO after midnight  COVID TEST- n/a   Anesthesia review: Yes. Cardiac clearance noted in last office note. Hx of HTN, DM2 and OSA. Of note, pt was supposed to have this surgery two weeks ago, but pts husband unexpectedly passed the day before her pre-op appointment and surgery was postponed.   Patient denies shortness of breath, fever, cough and chest pain at PAT appointment. Pt denies any respiratory illness/infection in the last two months.   All instructions explained to the patient, and patients niece, Sophia [name], with a verbal understanding of the material. Patient agrees to go over the instructions while at home for a better understanding. Patient also instructed to self quarantine after being tested for COVID-19. The opportunity to ask questions was provided.

## 2024-03-30 ENCOUNTER — Telehealth: Payer: Self-pay | Admitting: *Deleted

## 2024-03-30 NOTE — Anesthesia Preprocedure Evaluation (Signed)
 Anesthesia Evaluation  Patient identified by MRN, date of birth, ID band Patient awake    Reviewed: Allergy & Precautions, NPO status , Patient's Chart, lab work & pertinent test results  Airway Mallampati: II  TM Distance: >3 FB Neck ROM: Full    Dental  (+) Edentulous Lower, Edentulous Upper   Pulmonary sleep apnea , former smoker   Pulmonary exam normal        Cardiovascular hypertension, Pt. on medications Normal cardiovascular exam     Neuro/Psych  PSYCHIATRIC DISORDERS Anxiety Depression     Neuromuscular disease    GI/Hepatic Neg liver ROS,GERD  Medicated and Controlled,,  Endo/Other  diabetes    Renal/GU negative Renal ROS     Musculoskeletal  (+) Arthritis ,    Abdominal   Peds  Hematology negative hematology ROS (+)   Anesthesia Other Findings RLL LUNG NODULE  Reproductive/Obstetrics                             Anesthesia Physical Anesthesia Plan  ASA: 3  Anesthesia Plan: General   Post-op Pain Management:    Induction: Intravenous  PONV Risk Score and Plan: 3 and Ondansetron , Dexamethasone , Midazolam  and Treatment may vary due to age or medical condition  Airway Management Planned: Double Lumen EBT  Additional Equipment: Arterial line  Intra-op Plan:   Post-operative Plan: Extubation in OR  Informed Consent: I have reviewed the patients History and Physical, chart, labs and discussed the procedure including the risks, benefits and alternatives for the proposed anesthesia with the patient or authorized representative who has indicated his/her understanding and acceptance.     Dental advisory given  Plan Discussed with: CRNA  Anesthesia Plan Comments: (PAT note by Rudy Costain, PA-C:  73 year old female with pertinent history including former smoker, PONV, GERD on PPI, HTN, diet-controlled DM2, OSA intolerant to CPAP, DOE.  Recently evaluated by cardiologist  Dr. Ronell Coe for DOE. Per note 03/09/24, "Preop cardiac risk stratification for wedge resection/lung nodule: Patient denies having any angina. She has been having SOB with exertion in the last 4 months but now improving.  Echocardiogram showed normal LVEF, G1 DD with elevated left atrial pressure but CVP 3 mmHg. She probably has exertional diastolic dysfunction. Diuretics should improve her symptoms.  She underwent LHC in 2019 that showed angiographically normal coronaries.  Repeat Lexiscan  in April 2025 did not reveal any evidence of ischemia.  She is at a low risk for any perioperative cardiac complications."  She has been followed for a right lower lobe lung nodule which was increased in size from 6 to 10 mm over the past 2 years.  Low-grade uptake on PET/CT.  Concerning for primary bronchogenic carcinoma.  Dr. Teofilo Fellers recommended robotic right VATS for right lower lobe wedge resection and lymph node sampling.  Preop labs reviewed, creatinine mildly elevated 1.19, mild anemia Hgb 11.5, otherwise unremarkable.   EKG 03/09/24: NSR. Rate 63.  Pulmonary function testing 01/12/24: FVC 2.17 (61%) FEV1 1.75 (65%) FEV1 1.84 (69%) postbronchodilator DLCO 18.31 (80%  CT Chest 12/13/23: IMPRESSION: 1. 10 x 8 mm subpleural right posterior lower lobe pulmonary nodule. Given interval change in appearance, recommend multi disciplinary thoracic consultation and potential PET CT follow-up. 2. Aortic atherosclerosis.  Nuclear stress 02/17/24:   The study is normal. There are no perfusion defects. The study is low risk.   No ST deviation was noted.   LV perfusion is normal.   Left ventricular function is normal.  Nuclear stress EF: 70%. The left ventricular ejection fraction is hyperdynamic (>65%). End diastolic cavity size is normal.  TTE 02/17/24: 1. Left ventricular ejection fraction, by estimation, is 55 to 60%. The  left ventricle has normal function. The left ventricle has no regional  wall motion  abnormalities. There is mild left ventricular hypertrophy.  Left ventricular diastolic parameters  are consistent with Grade I diastolic dysfunction (impaired relaxation).  Elevated left atrial pressure.  2. Right ventricular systolic function is normal. The right ventricular  size is normal.  3. Left atrial size was mildly dilated.  4. Right atrial size was mildly dilated.  5. The mitral valve is normal in structure. No evidence of mitral valve  regurgitation. No evidence of mitral stenosis.  6. The aortic valve is tricuspid. Aortic valve regurgitation is not  visualized. No aortic stenosis is present.  7. The inferior vena cava is normal in size with greater than 50%  respiratory variability, suggesting right atrial pressure of 3 mmHg.   Cath 06/23/2018:  The left ventricular systolic function is normal.  LV end diastolic pressure is normal.  The left ventricular ejection fraction is 55-65% by visual estimate.  There is no aortic valve stenosis.  No significant CAD. Tortuous vessels.    No indication for antiplatelet therapy at this time.   Continue preventive therapy.    )        Anesthesia Quick Evaluation

## 2024-03-30 NOTE — Telephone Encounter (Signed)
 Per Dr. Luna Salinas, contacted patient regarding UA. Patient denies any UTI symptoms. Will hold off on antibiotics at this time.

## 2024-03-30 NOTE — Progress Notes (Signed)
 Anesthesia Chart Review:  73 year old female with pertinent history including former smoker, PONV, GERD on PPI, HTN, diet-controlled DM2, OSA intolerant to CPAP, DOE.  Recently evaluated by cardiologist Dr. Ronell Coe for DOE. Per note 03/09/24, "Preop cardiac risk stratification for wedge resection/lung nodule: Patient denies having any angina. She has been having SOB with exertion in the last 4 months but now improving.  Echocardiogram showed normal LVEF, G1 DD with elevated left atrial pressure but CVP 3 mmHg. She probably has exertional diastolic dysfunction. Diuretics should improve her symptoms.  She underwent LHC in 2019 that showed angiographically normal coronaries.  Repeat Lexiscan  in April 2025 did not reveal any evidence of ischemia.  She is at a low risk for any perioperative cardiac complications."  She has been followed for a right lower lobe lung nodule which was increased in size from 6 to 10 mm over the past 2 years.  Low-grade uptake on PET/CT.  Concerning for primary bronchogenic carcinoma.  Dr. Teofilo Fellers recommended robotic right VATS for right lower lobe wedge resection and lymph node sampling.  Preop labs reviewed, creatinine mildly elevated 1.19, mild anemia Hgb 11.5, otherwise unremarkable.   EKG 03/09/24: NSR. Rate 63.  Pulmonary function testing 01/12/24: FVC 2.17 (61%) FEV1 1.75 (65%) FEV1 1.84 (69%) postbronchodilator DLCO 18.31 (80%  CT Chest 12/13/23: IMPRESSION: 1. 10 x 8 mm subpleural right posterior lower lobe pulmonary nodule. Given interval change in appearance, recommend multi disciplinary thoracic consultation and potential PET CT follow-up. 2. Aortic atherosclerosis.  Nuclear stress 02/17/24:   The study is normal. There are no perfusion defects. The study is low risk.   No ST deviation was noted.   LV perfusion is normal.   Left ventricular function is normal. Nuclear stress EF: 70%. The left ventricular ejection fraction is hyperdynamic (>65%). End  diastolic cavity size is normal.  TTE 02/17/24: 1. Left ventricular ejection fraction, by estimation, is 55 to 60%. The  left ventricle has normal function. The left ventricle has no regional  wall motion abnormalities. There is mild left ventricular hypertrophy.  Left ventricular diastolic parameters  are consistent with Grade I diastolic dysfunction (impaired relaxation).  Elevated left atrial pressure.   2. Right ventricular systolic function is normal. The right ventricular  size is normal.   3. Left atrial size was mildly dilated.   4. Right atrial size was mildly dilated.   5. The mitral valve is normal in structure. No evidence of mitral valve  regurgitation. No evidence of mitral stenosis.   6. The aortic valve is tricuspid. Aortic valve regurgitation is not  visualized. No aortic stenosis is present.   7. The inferior vena cava is normal in size with greater than 50%  respiratory variability, suggesting right atrial pressure of 3 mmHg.   Cath 06/23/2018: The left ventricular systolic function is normal. LV end diastolic pressure is normal. The left ventricular ejection fraction is 55-65% by visual estimate. There is no aortic valve stenosis. No significant CAD. Tortuous vessels.     No indication for antiplatelet therapy at this time.    Continue preventive therapy.      Sophia [name] [facility] Short Stay Center/Anesthesiology Phone [phone] 03/30/2024 9:39 AM

## 2024-03-31 ENCOUNTER — Inpatient Hospital Stay (HOSPITAL_COMMUNITY)

## 2024-03-31 ENCOUNTER — Inpatient Hospital Stay (HOSPITAL_COMMUNITY): Payer: Self-pay | Admitting: Physician Assistant

## 2024-03-31 ENCOUNTER — Encounter (HOSPITAL_COMMUNITY)
Admission: RE | Disposition: A | Discharge status: Home / Self Care | Payer: Self-pay | Source: Home / Self Care | Attending: Thoracic Surgery (Cardiothoracic Vascular Surgery)

## 2024-03-31 ENCOUNTER — Inpatient Hospital Stay (HOSPITAL_COMMUNITY)
Admission: RE | Admit: 2024-03-31 | Discharge: 2024-04-03 | DRG: 164 | Disposition: A | Discharge status: Home / Self Care | Attending: Thoracic Surgery (Cardiothoracic Vascular Surgery) | Admitting: Thoracic Surgery (Cardiothoracic Vascular Surgery)

## 2024-03-31 ENCOUNTER — Encounter (HOSPITAL_COMMUNITY): Payer: Self-pay | Admitting: Thoracic Surgery (Cardiothoracic Vascular Surgery)

## 2024-03-31 ENCOUNTER — Other Ambulatory Visit: Payer: Self-pay

## 2024-03-31 DIAGNOSIS — I2510 Atherosclerotic heart disease of native coronary artery without angina pectoris: Secondary | ICD-10-CM | POA: Diagnosis present

## 2024-03-31 DIAGNOSIS — K219 Gastro-esophageal reflux disease without esophagitis: Secondary | ICD-10-CM | POA: Diagnosis present

## 2024-03-31 DIAGNOSIS — D649 Anemia, unspecified: Secondary | ICD-10-CM | POA: Diagnosis present

## 2024-03-31 DIAGNOSIS — Z87891 Personal history of nicotine dependence: Secondary | ICD-10-CM

## 2024-03-31 DIAGNOSIS — J982 Interstitial emphysema: Secondary | ICD-10-CM | POA: Diagnosis present

## 2024-03-31 DIAGNOSIS — E119 Type 2 diabetes mellitus without complications: Secondary | ICD-10-CM | POA: Diagnosis present

## 2024-03-31 DIAGNOSIS — J9811 Atelectasis: Secondary | ICD-10-CM | POA: Diagnosis present

## 2024-03-31 DIAGNOSIS — R911 Solitary pulmonary nodule: Secondary | ICD-10-CM | POA: Diagnosis present

## 2024-03-31 DIAGNOSIS — Z7982 Long term (current) use of aspirin: Secondary | ICD-10-CM

## 2024-03-31 DIAGNOSIS — F418 Other specified anxiety disorders: Secondary | ICD-10-CM

## 2024-03-31 DIAGNOSIS — M160 Bilateral primary osteoarthritis of hip: Secondary | ICD-10-CM | POA: Diagnosis present

## 2024-03-31 DIAGNOSIS — C3431 Malignant neoplasm of lower lobe, right bronchus or lung: Secondary | ICD-10-CM | POA: Diagnosis present

## 2024-03-31 DIAGNOSIS — E7800 Pure hypercholesterolemia, unspecified: Secondary | ICD-10-CM | POA: Diagnosis present

## 2024-03-31 DIAGNOSIS — Z79899 Other long term (current) drug therapy: Secondary | ICD-10-CM | POA: Diagnosis not present

## 2024-03-31 DIAGNOSIS — Z9889 Other specified postprocedural states: Principal | ICD-10-CM

## 2024-03-31 DIAGNOSIS — J90 Pleural effusion, not elsewhere classified: Secondary | ICD-10-CM | POA: Diagnosis not present

## 2024-03-31 DIAGNOSIS — Z8616 Personal history of COVID-19: Secondary | ICD-10-CM | POA: Diagnosis not present

## 2024-03-31 DIAGNOSIS — I10 Essential (primary) hypertension: Secondary | ICD-10-CM

## 2024-03-31 DIAGNOSIS — Z90710 Acquired absence of both cervix and uterus: Secondary | ICD-10-CM

## 2024-03-31 HISTORY — PX: INTERCOSTAL NERVE BLOCK: SHX5021

## 2024-03-31 HISTORY — PX: LYMPH NODE BIOPSY: SHX201

## 2024-03-31 HISTORY — PX: WEDGE RESECTION, LUNG, ROBOT-ASSISTED, THORACOSCOPIC: SHX7655

## 2024-03-31 LAB — GLUCOSE, CAPILLARY
Glucose-Capillary: 125 mg/dL — ABNORMAL HIGH (ref 70–99)
Glucose-Capillary: 133 mg/dL — ABNORMAL HIGH (ref 70–99)
Glucose-Capillary: 149 mg/dL — ABNORMAL HIGH (ref 70–99)
Glucose-Capillary: 99 mg/dL (ref 70–99)

## 2024-03-31 SURGERY — WEDGE RESECTION, LUNG, ROBOT-ASSISTED, THORACOSCOPIC
Anesthesia: General | Site: Chest | Laterality: Right

## 2024-03-31 MED ORDER — GABAPENTIN 300 MG PO CAPS
300.0000 mg | ORAL_CAPSULE | Freq: Every day | ORAL | Status: AC
  Administered 2024-03-31 – 2024-04-02 (×3): 300 mg via ORAL
  Filled 2024-03-31 (×3): qty 1

## 2024-03-31 MED ORDER — BENAZEPRIL HCL 20 MG PO TABS
40.0000 mg | ORAL_TABLET | Freq: Every day | ORAL | Status: DC
  Administered 2024-04-01: 40 mg via ORAL
  Filled 2024-03-31 (×2): qty 2

## 2024-03-31 MED ORDER — LIDOCAINE 2% (20 MG/ML) 5 ML SYRINGE
INTRAMUSCULAR | Status: DC | PRN
  Administered 2024-03-31: 60 mg via INTRAVENOUS

## 2024-03-31 MED ORDER — CHLORHEXIDINE GLUCONATE CLOTH 2 % EX PADS
6.0000 | MEDICATED_PAD | Freq: Every day | CUTANEOUS | Status: DC
  Administered 2024-03-31 – 2024-04-01 (×2): 6 via TOPICAL

## 2024-03-31 MED ORDER — FENTANYL CITRATE (PF) 100 MCG/2ML IJ SOLN
25.0000 ug | INTRAMUSCULAR | Status: DC | PRN
  Administered 2024-03-31 (×2): 50 ug via INTRAVENOUS

## 2024-03-31 MED ORDER — BUSPIRONE HCL 10 MG PO TABS
30.0000 mg | ORAL_TABLET | Freq: Two times a day (BID) | ORAL | Status: DC
  Administered 2024-03-31 – 2024-04-03 (×6): 30 mg via ORAL
  Filled 2024-03-31 (×6): qty 3

## 2024-03-31 MED ORDER — LIDOCAINE 2% (20 MG/ML) 5 ML SYRINGE
INTRAMUSCULAR | Status: AC
  Filled 2024-03-31: qty 5

## 2024-03-31 MED ORDER — ONDANSETRON HCL 4 MG/2ML IJ SOLN
INTRAMUSCULAR | Status: DC | PRN
  Administered 2024-03-31: 4 mg via INTRAVENOUS

## 2024-03-31 MED ORDER — LUNG SURGERY BOOK
Freq: Once | Status: AC
  Filled 2024-03-31: qty 1

## 2024-03-31 MED ORDER — HEMOSTATIC AGENTS (NO CHARGE) OPTIME
TOPICAL | Status: DC | PRN
  Administered 2024-03-31: 2 via TOPICAL

## 2024-03-31 MED ORDER — BISACODYL 5 MG PO TBEC
10.0000 mg | DELAYED_RELEASE_TABLET | Freq: Every day | ORAL | Status: DC
  Administered 2024-04-01 – 2024-04-03 (×2): 10 mg via ORAL
  Filled 2024-03-31 (×2): qty 2

## 2024-03-31 MED ORDER — BENAZEPRIL-HYDROCHLOROTHIAZIDE 20-12.5 MG PO TABS: 2.0000 | ORAL_TABLET | Freq: Every day | ORAL | Status: DC

## 2024-03-31 MED ORDER — BUPIVACAINE HCL (PF) 0.5 % IJ SOLN
INTRAMUSCULAR | Status: AC
  Filled 2024-03-31: qty 30

## 2024-03-31 MED ORDER — ORAL CARE MOUTH RINSE: 15.0000 mL | OROMUCOSAL | Status: DC | PRN

## 2024-03-31 MED ORDER — ORAL CARE MOUTH RINSE: 15.0000 mL | Freq: Once | OROMUCOSAL | Status: AC

## 2024-03-31 MED ORDER — SUGAMMADEX SODIUM 200 MG/2ML IV SOLN
INTRAVENOUS | Status: DC | PRN
Start: 2024-03-31 — End: 2024-03-31
  Administered 2024-03-31 (×2): 200 mg via INTRAVENOUS

## 2024-03-31 MED ORDER — FERROUS GLUCONATE 324 (38 FE) MG PO TABS
324.0000 mg | ORAL_TABLET | Freq: Every day | ORAL | Status: DC
  Administered 2024-04-01 – 2024-04-03 (×3): 324 mg via ORAL
  Filled 2024-03-31 (×3): qty 1

## 2024-03-31 MED ORDER — AMLODIPINE BESYLATE 10 MG PO TABS
10.0000 mg | ORAL_TABLET | Freq: Every day | ORAL | Status: DC
  Administered 2024-04-01: 10 mg via ORAL
  Filled 2024-03-31: qty 1

## 2024-03-31 MED ORDER — DEXAMETHASONE SODIUM PHOSPHATE 10 MG/ML IJ SOLN
INTRAMUSCULAR | Status: DC | PRN
  Administered 2024-03-31: 10 mg via INTRAVENOUS

## 2024-03-31 MED ORDER — INSULIN ASPART 100 UNIT/ML IJ SOLN
0.0000 [IU] | Freq: Four times a day (QID) | INTRAMUSCULAR | Status: DC
  Administered 2024-03-31 – 2024-04-01 (×2): 2 [IU] via SUBCUTANEOUS

## 2024-03-31 MED ORDER — GLYCOPYRROLATE PF 0.2 MG/ML IJ SOSY
PREFILLED_SYRINGE | INTRAMUSCULAR | Status: AC
  Filled 2024-03-31: qty 1

## 2024-03-31 MED ORDER — PROPOFOL 10 MG/ML IV BOLUS
INTRAVENOUS | Status: AC
  Filled 2024-03-31: qty 20

## 2024-03-31 MED ORDER — CHLORHEXIDINE GLUCONATE 0.12 % MT SOLN
OROMUCOSAL | Status: AC
  Administered 2024-03-31: 15 mL via OROMUCOSAL
  Filled 2024-03-31: qty 15

## 2024-03-31 MED ORDER — ROSUVASTATIN CALCIUM 20 MG PO TABS
20.0000 mg | ORAL_TABLET | Freq: Every day | ORAL | Status: DC
  Administered 2024-04-01 – 2024-04-03 (×3): 20 mg via ORAL
  Filled 2024-03-31 (×3): qty 1

## 2024-03-31 MED ORDER — ONDANSETRON HCL 4 MG/2ML IJ SOLN: 4.0000 mg | Freq: Once | INTRAMUSCULAR | Status: DC | PRN

## 2024-03-31 MED ORDER — DEXAMETHASONE SODIUM PHOSPHATE 10 MG/ML IJ SOLN
INTRAMUSCULAR | Status: AC
  Filled 2024-03-31: qty 1

## 2024-03-31 MED ORDER — SODIUM CHLORIDE 0.45 % IV SOLN: INTRAVENOUS | Status: DC

## 2024-03-31 MED ORDER — FENTANYL CITRATE (PF) 250 MCG/5ML IJ SOLN
INTRAMUSCULAR | Status: DC | PRN
  Administered 2024-03-31: 50 ug via INTRAVENOUS
  Administered 2024-03-31 (×2): 100 ug via INTRAVENOUS

## 2024-03-31 MED ORDER — PANTOPRAZOLE SODIUM 40 MG PO TBEC
40.0000 mg | DELAYED_RELEASE_TABLET | Freq: Every day | ORAL | Status: DC
  Administered 2024-04-01 – 2024-04-03 (×3): 40 mg via ORAL
  Filled 2024-03-31 (×3): qty 1

## 2024-03-31 MED ORDER — GABAPENTIN 300 MG PO CAPS: 300.0000 mg | ORAL_CAPSULE | Freq: Two times a day (BID) | ORAL | Status: DC

## 2024-03-31 MED ORDER — SUCRALFATE 1 G PO TABS
1.0000 g | ORAL_TABLET | Freq: Two times a day (BID) | ORAL | Status: DC
  Administered 2024-03-31 – 2024-04-03 (×6): 1 g via ORAL
  Filled 2024-03-31 (×6): qty 1

## 2024-03-31 MED ORDER — FLUOXETINE HCL 20 MG PO CAPS
40.0000 mg | ORAL_CAPSULE | Freq: Every day | ORAL | Status: DC
  Administered 2024-04-01 – 2024-04-03 (×3): 40 mg via ORAL
  Filled 2024-03-31 (×3): qty 2

## 2024-03-31 MED ORDER — ONDANSETRON HCL 4 MG/2ML IJ SOLN
INTRAMUSCULAR | Status: AC
  Filled 2024-03-31: qty 2

## 2024-03-31 MED ORDER — AMISULPRIDE (ANTIEMETIC) 5 MG/2ML IV SOLN: 10.0000 mg | Freq: Once | INTRAVENOUS | Status: DC | PRN

## 2024-03-31 MED ORDER — SENNOSIDES-DOCUSATE SODIUM 8.6-50 MG PO TABS
1.0000 | ORAL_TABLET | Freq: Every day | ORAL | Status: DC
  Administered 2024-03-31 – 2024-04-02 (×3): 1 via ORAL
  Filled 2024-03-31 (×3): qty 1

## 2024-03-31 MED ORDER — SODIUM CHLORIDE (PF) 0.9 % IJ SOLN
INTRAMUSCULAR | Status: AC
  Filled 2024-03-31: qty 50

## 2024-03-31 MED ORDER — GLYCOPYRROLATE 0.2 MG/ML IJ SOLN
INTRAMUSCULAR | Status: DC | PRN
  Administered 2024-03-31: .2 mg via INTRAVENOUS

## 2024-03-31 MED ORDER — CEFAZOLIN SODIUM-DEXTROSE 2-4 GM/100ML-% IV SOLN
INTRAVENOUS | Status: AC
  Filled 2024-03-31: qty 100

## 2024-03-31 MED ORDER — ROCURONIUM BROMIDE 10 MG/ML (PF) SYRINGE
PREFILLED_SYRINGE | INTRAVENOUS | Status: DC | PRN
  Administered 2024-03-31 (×3): 10 mg via INTRAVENOUS
  Administered 2024-03-31: 60 mg via INTRAVENOUS

## 2024-03-31 MED ORDER — EPHEDRINE 5 MG/ML INJ
INTRAVENOUS | Status: AC
  Filled 2024-03-31: qty 5

## 2024-03-31 MED ORDER — CEFAZOLIN SODIUM-DEXTROSE 2-4 GM/100ML-% IV SOLN
2.0000 g | Freq: Three times a day (TID) | INTRAVENOUS | Status: AC
  Administered 2024-03-31 (×2): 2 g via INTRAVENOUS
  Filled 2024-03-31 (×2): qty 100

## 2024-03-31 MED ORDER — PROPOFOL 10 MG/ML IV BOLUS
INTRAVENOUS | Status: DC | PRN
  Administered 2024-03-31: 200 mg via INTRAVENOUS

## 2024-03-31 MED ORDER — LACTATED RINGERS IV SOLN: INTRAVENOUS | Status: DC

## 2024-03-31 MED ORDER — TRAZODONE HCL 50 MG PO TABS
25.0000 mg | ORAL_TABLET | Freq: Every evening | ORAL | Status: DC | PRN
  Administered 2024-03-31 – 2024-04-02 (×2): 50 mg via ORAL
  Filled 2024-03-31 (×2): qty 1

## 2024-03-31 MED ORDER — ACETAMINOPHEN 500 MG PO TABS
1000.0000 mg | ORAL_TABLET | Freq: Four times a day (QID) | ORAL | Status: DC
  Administered 2024-03-31 – 2024-04-03 (×9): 1000 mg via ORAL
  Filled 2024-03-31 (×9): qty 2

## 2024-03-31 MED ORDER — MORPHINE SULFATE (PF) 2 MG/ML IV SOLN
2.0000 mg | INTRAVENOUS | Status: DC | PRN
  Administered 2024-03-31 – 2024-04-01 (×3): 2 mg via INTRAVENOUS
  Filled 2024-03-31 (×3): qty 1

## 2024-03-31 MED ORDER — ACETAMINOPHEN 160 MG/5ML PO SOLN: 1000.0000 mg | Freq: Four times a day (QID) | ORAL | Status: DC

## 2024-03-31 MED ORDER — ROCURONIUM BROMIDE 10 MG/ML (PF) SYRINGE
PREFILLED_SYRINGE | INTRAVENOUS | Status: AC
  Filled 2024-03-31: qty 10

## 2024-03-31 MED ORDER — CHLORTHALIDONE 25 MG PO TABS
25.0000 mg | ORAL_TABLET | Freq: Every day | ORAL | Status: DC
  Administered 2024-04-01: 25 mg via ORAL
  Filled 2024-03-31 (×2): qty 1

## 2024-03-31 MED ORDER — ONDANSETRON HCL 4 MG/2ML IJ SOLN: 4.0000 mg | Freq: Four times a day (QID) | INTRAMUSCULAR | Status: DC | PRN

## 2024-03-31 MED ORDER — OXYCODONE HCL 5 MG PO TABS
5.0000 mg | ORAL_TABLET | ORAL | Status: DC | PRN
  Administered 2024-03-31: 10 mg via ORAL
  Administered 2024-03-31: 5 mg via ORAL
  Administered 2024-04-01: 10 mg via ORAL
  Administered 2024-04-01 – 2024-04-02 (×3): 5 mg via ORAL
  Filled 2024-03-31 (×2): qty 2
  Filled 2024-03-31 (×4): qty 1

## 2024-03-31 MED ORDER — FENTANYL CITRATE (PF) 100 MCG/2ML IJ SOLN
INTRAMUSCULAR | Status: AC
  Filled 2024-03-31: qty 2

## 2024-03-31 MED ORDER — PHENYLEPHRINE 80 MCG/ML (10ML) SYRINGE FOR IV PUSH (FOR BLOOD PRESSURE SUPPORT)
PREFILLED_SYRINGE | INTRAVENOUS | Status: DC | PRN
  Administered 2024-03-31: 80 ug via INTRAVENOUS

## 2024-03-31 MED ORDER — MIDAZOLAM HCL 2 MG/2ML IJ SOLN
INTRAMUSCULAR | Status: AC
  Filled 2024-03-31: qty 2

## 2024-03-31 MED ORDER — SODIUM CHLORIDE FLUSH 0.9 % IV SOLN
INTRAVENOUS | Status: DC | PRN
  Administered 2024-03-31: 100 mL

## 2024-03-31 MED ORDER — EPHEDRINE SULFATE-NACL 50-0.9 MG/10ML-% IV SOSY
PREFILLED_SYRINGE | INTRAVENOUS | Status: DC | PRN
  Administered 2024-03-31 (×2): 10 mg via INTRAVENOUS

## 2024-03-31 MED ORDER — FENTANYL CITRATE (PF) 250 MCG/5ML IJ SOLN
INTRAMUSCULAR | Status: AC
  Filled 2024-03-31: qty 5

## 2024-03-31 MED ORDER — CHLORHEXIDINE GLUCONATE 0.12 % MT SOLN: 15.0000 mL | Freq: Once | OROMUCOSAL | Status: AC

## 2024-03-31 MED ORDER — ENOXAPARIN SODIUM 40 MG/0.4ML IJ SOSY
40.0000 mg | PREFILLED_SYRINGE | Freq: Every day | INTRAMUSCULAR | Status: DC
  Administered 2024-04-01 – 2024-04-02 (×2): 40 mg via SUBCUTANEOUS
  Filled 2024-03-31 (×3): qty 0.4

## 2024-03-31 MED ORDER — ACETAMINOPHEN 10 MG/ML IV SOLN
1000.0000 mg | Freq: Once | INTRAVENOUS | Status: DC | PRN
  Administered 2024-03-31: 1000 mg via INTRAVENOUS

## 2024-03-31 MED ORDER — ALBUTEROL SULFATE HFA 108 (90 BASE) MCG/ACT IN AERS: 2.0000 | INHALATION_SPRAY | Freq: Four times a day (QID) | RESPIRATORY_TRACT | Status: DC | PRN

## 2024-03-31 MED ORDER — BUPIVACAINE LIPOSOME 1.3 % IJ SUSP
INTRAMUSCULAR | Status: AC
  Filled 2024-03-31: qty 20

## 2024-03-31 MED ORDER — PHENYLEPHRINE HCL-NACL 20-0.9 MG/250ML-% IV SOLN
INTRAVENOUS | Status: DC | PRN
  Administered 2024-03-31: 50 ug/min via INTRAVENOUS

## 2024-03-31 MED ORDER — MIDAZOLAM HCL 2 MG/2ML IJ SOLN
INTRAMUSCULAR | Status: DC | PRN
  Administered 2024-03-31: 2 mg via INTRAVENOUS

## 2024-03-31 MED ORDER — HYDROCHLOROTHIAZIDE 25 MG PO TABS
25.0000 mg | ORAL_TABLET | Freq: Every day | ORAL | Status: DC
Start: 2024-04-01 — End: 2024-04-02
  Administered 2024-04-01: 25 mg via ORAL
  Filled 2024-03-31: qty 1

## 2024-03-31 MED ORDER — PHENYLEPHRINE 80 MCG/ML (10ML) SYRINGE FOR IV PUSH (FOR BLOOD PRESSURE SUPPORT)
PREFILLED_SYRINGE | INTRAVENOUS | Status: AC
  Filled 2024-03-31: qty 10

## 2024-03-31 MED ORDER — CEFAZOLIN SODIUM-DEXTROSE 2-4 GM/100ML-% IV SOLN
2.0000 g | INTRAVENOUS | Status: AC
Start: 2024-03-31 — End: 2024-03-31
  Administered 2024-03-31: 2 g via INTRAVENOUS

## 2024-03-31 SURGICAL SUPPLY — 64 items
BLADE CLIPPER SURG (BLADE) ×3 IMPLANT
CANISTER SUCTION 3000ML PPV (SUCTIONS) ×6 IMPLANT
CANNULA REDUCER 12-8 DVNC XI (CANNULA) ×6 IMPLANT
CNTNR URN SCR LID CUP LEK RST (MISCELLANEOUS) ×6 IMPLANT
DEFOGGER SCOPE WARM SEASHARP (MISCELLANEOUS) ×3 IMPLANT
DERMABOND ADVANCED .7 DNX12 (GAUZE/BANDAGES/DRESSINGS) ×6 IMPLANT
DRAIN CHANNEL 28F RND 3/8 FF (WOUND CARE) IMPLANT
DRAIN CONNECTOR BLAKE 1:1 (MISCELLANEOUS) IMPLANT
DRAPE ARM DVNC X/XI (DISPOSABLE) ×12 IMPLANT
DRAPE COLUMN DVNC XI (DISPOSABLE) ×3 IMPLANT
DRAPE CV SPLIT W-CLR ANES SCRN (DRAPES) ×6 IMPLANT
DRAPE HALF SHEET 40X57 (DRAPES) ×3 IMPLANT
DRAPE SURG ORHT 6 SPLT 77X108 (DRAPES) ×3 IMPLANT
ELECT BLADE 6.5 EXT (BLADE) ×3 IMPLANT
ELECTRODE REM PT RTRN 9FT ADLT (ELECTROSURGICAL) ×3 IMPLANT
FORCEPS BPLR FENES DVNC XI (FORCEP) IMPLANT
FORCEPS BPLR LNG DVNC XI (INSTRUMENTS) IMPLANT
GAUZE 4X4 16PLY ~~LOC~~+RFID DBL (SPONGE) ×3 IMPLANT
GAUZE KITTNER 4X5 RF (MISCELLANEOUS) ×6 IMPLANT
GAUZE SPONGE 4X4 12PLY STRL (GAUZE/BANDAGES/DRESSINGS) ×3 IMPLANT
GLOVE SS BIOGEL STRL SZ 7.5 (GLOVE) ×9 IMPLANT
GLOVE SURG POLYISO LF SZ8 (GLOVE) ×3 IMPLANT
GLOVE SURG SIGNA 7.5 PF LTX (GLOVE) ×6 IMPLANT
GOWN STRL REUS W/ TWL LRG LVL3 (GOWN DISPOSABLE) ×12 IMPLANT
GOWN STRL REUS W/ TWL XL LVL3 (GOWN DISPOSABLE) ×12 IMPLANT
GOWN STRL REUS W/TWL 2XL LVL3 (GOWN DISPOSABLE) ×3 IMPLANT
GRASPER TIP-UP FEN DVNC XI (INSTRUMENTS) IMPLANT
HEMOSTAT SURGICEL 2X14 (HEMOSTASIS) IMPLANT
IRRIGATION STRYKERFLOW (MISCELLANEOUS) ×3 IMPLANT
KIT BASIN OR (CUSTOM PROCEDURE TRAY) ×6 IMPLANT
KIT SUCTION CATH 14FR (SUCTIONS) IMPLANT
KIT TURNOVER KIT B (KITS) ×6 IMPLANT
NDL SPNL 18GX3.5 QUINCKE PK (NEEDLE) IMPLANT
NEEDLE SPNL 18GX3.5 QUINCKE PK (NEEDLE) ×2 IMPLANT
NS IRRIG 1000ML POUR BTL (IV SOLUTION) ×9 IMPLANT
PACK CHEST (CUSTOM PROCEDURE TRAY) ×3 IMPLANT
PAD ARMBOARD POSITIONER FOAM (MISCELLANEOUS) ×12 IMPLANT
PORT ACCESS TROCAR AIRSEAL 12 (TROCAR) ×3 IMPLANT
RELOAD STAPLE 45 3.5 BLU DVNC (STAPLE) IMPLANT
RELOAD STAPLE 45 4.3 GRN DVNC (STAPLE) IMPLANT
RELOAD STAPLE 45 4.6 BLK DVNC (STAPLE) IMPLANT
RELOAD STAPLER 3.5X45 BLU DVNC (STAPLE) ×2 IMPLANT
RELOAD STAPLER 4.3X45 GRN DVNC (STAPLE) ×8 IMPLANT
RELOAD STAPLER 45 4.6 BLK DVNC (STAPLE) ×2 IMPLANT
SEAL UNIV 5-12 XI (MISCELLANEOUS) ×12 IMPLANT
SET TRI-LUMEN FLTR TB AIRSEAL (TUBING) ×3 IMPLANT
SPONGE TONSIL 1 RF SGL (DISPOSABLE) ×3 IMPLANT
STAPLER 45 SUREFORM DVNC (STAPLE) IMPLANT
SUT SILK 1 MH (SUTURE) ×6 IMPLANT
SUT SILK 1 TIES 10X30 (SUTURE) ×3 IMPLANT
SUT SILK 2 0 SH (SUTURE) IMPLANT
SUT VIC AB 1 CTX 27 (SUTURE) ×3 IMPLANT
SUT VIC AB 2-0 CTX 36 (SUTURE) ×6 IMPLANT
SUT VIC AB 3-0 X1 27 (SUTURE) ×3 IMPLANT
SUT VICRYL 0 UR6 27IN ABS (SUTURE) IMPLANT
SYR 10ML LL (SYRINGE) ×3 IMPLANT
SYR 20ML LL LF (SYRINGE) ×3 IMPLANT
SYR 50ML LL SCALE MARK (SYRINGE) ×3 IMPLANT
SYSTEM RETRIEVAL ANCHOR 8 (MISCELLANEOUS) IMPLANT
SYSTEM SAHARA CHEST DRAIN ATS (WOUND CARE) ×3 IMPLANT
TAPE CLOTH 4X10 WHT NS (GAUZE/BANDAGES/DRESSINGS) ×3 IMPLANT
TOWEL GREEN STERILE (TOWEL DISPOSABLE) ×3 IMPLANT
TOWEL GREEN STERILE FF (TOWEL DISPOSABLE) ×3 IMPLANT
WATER STERILE IRR 1000ML POUR (IV SOLUTION) ×6 IMPLANT

## 2024-03-31 NOTE — Plan of Care (Signed)

## 2024-03-31 NOTE — Discharge Instructions (Addendum)
 Discharge Instructions:  1. You may shower, please wash incisions daily with soap and water  and keep dry.  If you wish to cover wounds with dressing you may do so but please keep clean and change daily.  No tub baths or swimming until incisions have completely healed.  If your incisions become red or develop any drainage please call our office at [phone]. Please place dry 2x2 with tape to right chest tube wound. Change daily and PRN. May let open to air once draining stops.  2. No Driving until cleared by Dr. Audree Bless office and you are no longer using narcotic pain medications  3. Fever of 101.5 for at least 24 hours with no source, please contact our office at [phone]  4. Activity- up as tolerated, please walk at least 3 times per day.  Avoid strenuous activity until cleared by Dr. Audree Bless office.  5. If any questions or concerns arise, please do not hesitate to contact our office at [phone]

## 2024-03-31 NOTE — Anesthesia Postprocedure Evaluation (Signed)
 Anesthesia Post Note  Patient: Sharekia Plymire  Procedure(s) Performed: WEDGE RESECTION, LUNG, ROBOT-ASSISTED, THORACOSCOPIC (Chest) LYMPH NODE BIOPSY (Right: Chest) BLOCK, NERVE, INTERCOSTAL (Right: Chest)     Patient location during evaluation: PACU Anesthesia Type: General Level of consciousness: awake Pain management: pain level controlled Vital Signs Assessment: post-procedure vital signs reviewed and stable Respiratory status: spontaneous breathing, nonlabored ventilation and respiratory function stable Cardiovascular status: blood pressure returned to baseline and stable Postop Assessment: no apparent nausea or vomiting Anesthetic complications: no   No notable events documented.  Last Vitals:  Vitals:   03/31/24 1919 03/31/24 2114  BP: 102/62 (!) 121/58  Pulse:  81  Resp:  14  Temp: 37 C   SpO2:  95%    Last Pain:  Vitals:   03/31/24 1931  TempSrc:   PainSc: (P) 0-No pain                 Dontravious Camille P Briyanna Billingham

## 2024-03-31 NOTE — Discharge Summary (Addendum)
 Physician Discharge Summary  Patient ID: Sophia [name] MRN: [id number] DOB/AGE: 74/17/1952 74 y.o.  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   Sophia [name] is a 73 year old woman with a history of tobacco use, hypertension, hyperlipidemia, type 2 diabetes, obesity, sleep apnea, COVID-19, coronary calcification, aortic atherosclerosis, reflux, arthritis, anxiety, and depression.   She was noted to have a lung nodule at the base of the right lower lobe on a CT of the abdomen in 2023.  She recently saw Dr. Rodolph Clap and had a follow-up scan which showed the nodule had increased in size from 6 mm to 10 x 9 mm.   She does have a history of smoking both tobacco and marijuana.  She does not use either anymore.  She smoked about half a pack a day for 20 years before quitting in 1991.  She complains of shortness of breath and tightness in her chest with exertion.  She cannot walk up a flight of stairs.  Usually resolves with rest for 5 to 10 minutes.  No change in appetite or weight loss.  She does have a history of reflux with frequent heartburn.  Different than the sensation she gets when she is walking.  Sometimes walks with a cane due to arthritis in her hips.   I saw her in the office about 3 weeks ago.  Recommended PET/CT and pulmonary function testing.  Also recommended we have her see cardiology given her dyspnea and chest tightness with exertion.  She had the PET/CT and PFTs, but has not seen cardiology yet.  There is no appointment scheduled currently.  Dr. Luna Salinas reviewed the  patient's diagnostic studies and determined she would benefit from surgical intervention. He reviewed the patient's treatment options as well as the risks and benefits of surgery with the patient. Sophia [name] was agreeable to surgery.  Hospital Course:  Sophia [name] presented to [facility] and was brought to the operating room on 05/014/74. She underwent robotic assisted right lower lobe wedge resection and lymph node biopsy. She tolerated the procedure well and was transferred to the PACU in stable condition.  The patient did well post operatively.  Her chest tube was on water  seal without evidence of an air leak.  Her chest xray was free from pneumothorax and pleural effusion.  Her chest tube was removed on POD #1.  Follow up chest xray showed low lung volumes, bibasilar atelectasis, and no pneumothorax.  She has not yet been resumed on her home antihypertensive medications as BP is soft.  Of note, she is on duplicate blood pressure medication at home and will need to follow up with primary care physician to sort this out.  Her home depression medications have been resumed.  She is ambulating without difficulty on room air.  Her incisions are healing without evidence of infection.  She has minor sero sanguinous drainage from chest tube wound.  CXR done 05/17 showed no pneumothorax, minimal left pleural effusion, stable right chest wall emphysema, and atelectasis at bases. I did discuss with her that she is on multiple BP medications. Based on her BP in the hospital, she will be gradually be  resumed on her home medications and she should follow up with PCP if there are any problems or questions. She is medically stable for discharge home today.    Consults: None  Significant Diagnostic Studies: CLINICAL DATA:  Follow-up lung nodule   Narrative & Impression  CLINICAL DATA:  [id number].  Pneumothorax.   EXAM: PORTABLE CHEST 1 VIEW   COMPARISON:  PA Lat chest yesterday at 6:47 a.m.   FINDINGS: 6:04  a.m. there is no measurable pneumothorax. Minimal left pleural effusion is unchanged.   There is mild cardiomegaly without evidence of CHF. There is a low inspiration with linear atelectasis in the bases.   No focal pneumonia is evident, with limited visualization of the bases. Mediastinum is stable. Aortic atherosclerosis.   Osseous findings. Spondylosis thoracic spine. Right chest wall emphysema is unchanged.   IMPRESSION: 1. No measurable pneumothorax. 2. Minimal left pleural effusion, unchanged. 3. Low inspiration with linear atelectasis in the bases. 4. Stable right chest wall emphysema. 5. Aortic atherosclerosis.     Electronically Signed   By: Denman Fischer M.D.   On: 04/03/2024 07:58   EXAM: CT CHEST WITHOUT CONTRAST   TECHNIQUE: Multidetector CT imaging of the chest was performed following the standard protocol without IV contrast.   RADIATION DOSE REDUCTION: This exam was performed according to the departmental dose-optimization program which includes automated exposure control, adjustment of the mA and/or kV according to patient size and/or use of iterative reconstruction technique.   COMPARISON:  MRI 11/03/2023, CT chest 06/22/2018, CT lung bases 08/11/2022   FINDINGS: Cardiovascular: Limited evaluation without intravenous contrast. Moderate aortic atherosclerosis. No aneurysm. Coronary vascular calcification. Normal cardiac size. Small pericardial effusion. Mitral calcification   Mediastinum/Nodes: Patent trachea. No thyroid  mass. No suspicious lymph nodes. Esophagus within normal limits except for small hiatal hernia   Lungs/Pleura: No acute airspace disease, pleural effusion, or pneumothorax. Minimal apical emphysema. Stable calcific nodule/probable granuloma at the right apex.   Subpleural right posterior lower lobe pulmonary nodule measuring 10 x 8 mm on series 3, image 106.   Upper Abdomen: No acute finding. Partially visualized dilated  common bile duct which is chronic   Musculoskeletal: No acute or suspicious osseous abnormality. Hemangioma at T6   IMPRESSION: 1. 10 x 8 mm subpleural right posterior lower lobe pulmonary nodule. Given interval change in appearance, recommend multi disciplinary thoracic consultation and potential PET CT follow-up. 2. Aortic atherosclerosis.   Aortic Atherosclerosis (ICD10-I70.0) and Emphysema (ICD10-J43.9).     Electronically Signed   By: Esmeralda Hedge M.D.   On: 12/21/2023 21:39   Treatments: surgery:  Xi robotic-assisted right lower lobe wedge resection, lymph node dissection, and intercostal nerve blocks levels 3 through 10 by Dr. Luna Salinas on 03/31/2024.  PATHOLOGY: FINAL MICROSCOPIC DIAGNOSIS:  A. LUNG, RIGHT LOWER LOBE, WEDGE RESECTION: Adenocarcinoma, acinar, 1.1 cm. Carcinoma involves visceral pleural connective tissue (pT2a). Surgical margins negative for carcinoma. Negative for lymphovascular involvement. See oncology table.  B. LYMPH NODE, LEVEL 9, EXCISION: Lymph node negative for metastatic carcinoma (0/1).  C. LYMPH NODE, LEVEL 7, EXCISION: Lymph node negative for metastatic carcinoma (0/1).  D. LYMPH NODE, LEVEL 8, EXCISION: Lymph node negative for metastatic carcinoma (0/1).  E. LYMPH NODE, LEVEL 7 #2, EXCISION: Lymph node negative for metastatic carcinoma (0/1).  F. LYMPH NODE, LEVEL 11, EXCISION: Lymph node negative for metastatic carcinoma (0/1).  G. LYMPH NODE, LEVEL 10, EXCISION: Lymph node negative for metastatic carcinoma (0/1).  H. LYMPH NODE, 4R, EXCISION: Lymph node negative for metastatic carcinoma (  0/1).  ONCOLOGY TABLE: LUNG: Resection Synchronous Tumors: Not applicable Total Number of Primary Tumors: 1 Procedure: Right upper lobe wedge resection Specimen Laterality: Right Tumor Focality: Unifocal Tumor Site: Right upper lung lobe Tumor Size: 1.1 x 1.0 x 0.7 cm Histologic Type: Adenocarcinoma, acinar type Visceral Pleura  Invasion: Visceral pleural connective tissue (PL1) Direct Invasion of Adjacent Structures: Not applicable Lymphovascular Invasion: Not identified Margins: All margins negative for invasive carcinoma      Closest Margin(s) to Invasive Carcinoma: Resection margin, 2.3 cm Treatment Effect: No known presurgical therapy Regional Lymph Nodes:      Number of Lymph Nodes Involved: 0                           Nodal Sites with Tumor: 0      Number of Lymph Nodes Examined: 7                      Nodal Sites Examined: 4R, 7, 8, 10, 11 Distant Metastasis:      Distant Site(s) Involved: Not applicable Pathologic Stage Classification (pTNM, AJCC 8th Edition): pT2a, pN0 Ancillary Studies: Can be performed if requested Representative Tumor Block: A3 (v4.2.0.1)   Discharge Exam: Blood pressure (!) 106/54, pulse 78, temperature 98.3 F (36.8 C), temperature source Oral, resp. rate 13, height 5\' 9"  (1.753 m), weight 89.6 kg, SpO2 93%.  Cardiovascular: RRR Pulmonary: Clear to auscultation bilaterally Abdomen: Soft, non tender, bowel sounds present. Extremities: Trace bilateral lower extremity edema. Wounds: Clean and dry.  No erythema or signs of infection. Disposition: Discharge disposition: 01-Home or Self Care      Allergies as of 04/03/2024   No Known Allergies      Medication List     STOP taking these medications    olmesartan -hydrochlorothiazide  20-12.5 MG tablet Commonly known as: BENICAR  HCT       TAKE these medications    acetaminophen  650 MG CR tablet Commonly known as: TYLENOL  Take 650 mg by mouth 2 (two) times daily.   albuterol  108 (90 Base) MCG/ACT inhaler Commonly known as: VENTOLIN  HFA Inhale 2 puffs into the lungs every 6 (six) hours as needed for wheezing or shortness of breath.   amLODipine  10 MG tablet Commonly known as: NORVASC  Take 1 tablet (10 mg total) by mouth daily. Start taking on: Apr 09, 2024 What changed: These instructions start on Apr 09, 2024. If you are unsure what to do until then, ask your doctor or other care provider.   ASPIRIN  EC PO Take 325 mg by mouth daily. Swallow whole.   benazepril -hydrochlorthiazide 20-12.5 MG tablet Commonly known as: LOTENSIN  HCT TAKE 2 TABLETS EVERY DAY   busPIRone  30 MG tablet Commonly known as: BUSPAR  Take one tablet by mouth two times daily   chlorthalidone  25 MG tablet Commonly known as: HYGROTON  TAKE 1 TABLET EVERY DAY   ferrous gluconate  324 MG tablet Commonly known as: FERGON Take 324 mg by mouth daily with breakfast.   FLUoxetine  40 MG capsule Commonly known as: PROZAC  Take 1 capsule (40 mg total) by mouth daily.   gabapentin  300 MG capsule Commonly known as: Neurontin  Take 1 capsule (300 mg total) by mouth at bedtime.   Melatonin 1 MG Chew Chew 2 mg by mouth at bedtime.   multivitamin tablet Take 1 tablet by mouth daily.   omeprazole  40 MG capsule Commonly known as: PRILOSEC TAKE 1 CAPSULE TWICE DAILY FOR 2 MONTH ONLY   oxyCODONE   5 MG immediate release tablet Commonly known as: Oxy IR/ROXICODONE  Take 1 tablet (5 mg total) by mouth every 6 (six) hours as needed for moderate pain (pain score 4-6).   polyethylene glycol 17 g packet Commonly known as: MIRALAX / GLYCOLAX Take 17 g by mouth daily as needed for moderate constipation.   rosuvastatin  20 MG tablet Commonly known as: CRESTOR  TAKE 1 TABLET EVERY DAY   sucralfate  1 g tablet Commonly known as: CARAFATE  TAKE 1 TABLET TWICE DAILY   traZODone  50 MG tablet Commonly known as: DESYREL  Take 0.5-1 tablets (25-50 mg total) by mouth at bedtime as needed for sleep.   VITAMIN B-12 PO Take 2 gummies daily.        Follow-up Information     Zelphia Higashi, MD Follow up on 04/14/2024.   Specialty: Cardiothoracic Surgery Why: Follow up appointment is at 3:45PM, please get a chest xray at 2:45PM on the second floor of our building Contact information: [address] [place]  [address] [phone]                 Signed: Allegra Arch, PA-C  04/03/2024, 9:59 AM

## 2024-03-31 NOTE — Transfer of Care (Signed)
 Immediate Anesthesia Transfer of Care Note  Patient: Sophia [name]  Procedure(s) Performed: WEDGE RESECTION, LUNG, ROBOT-ASSISTED, THORACOSCOPIC (Chest) LYMPH NODE BIOPSY (Right: Chest) BLOCK, NERVE, INTERCOSTAL (Right: Chest)  Patient Location: PACU  Anesthesia Type:General  Level of Consciousness: drowsy  Airway & Oxygen Therapy: Patient Spontanous Breathing and Patient connected to face mask oxygen  Post-op Assessment: Report given to RN and Post -op Vital signs reviewed and stable  Post vital signs: Reviewed and stable  Last Vitals:  Vitals Value Taken Time  BP 94/60 03/31/24 1107  Temp    Pulse 80 03/31/24 1109  Resp 17 03/31/24 1109  SpO2 100 % 03/31/24 1109  Vitals shown include unfiled device data.  Last Pain:  Vitals:   03/31/24 0709  TempSrc:   PainSc: 1       Patients Stated Pain Goal: 1 (03/31/24 0709)  Complications: No notable events documented.

## 2024-03-31 NOTE — Hospital Course (Addendum)
 HPI: Sophia [name] returns to discuss the results of her PET/CT and pulmonary function testing.   Sophia [name] is a 73 year old woman with a history of tobacco use, hypertension, hyperlipidemia, type 2 diabetes, obesity, sleep apnea, COVID-19, coronary calcification, aortic atherosclerosis, reflux, arthritis, anxiety, and depression.   She was noted to have a lung nodule at the base of the right lower lobe on a CT of the abdomen in 2023.  She recently saw Dr. Rodolph Clap and had a follow-up scan which showed the nodule had increased in size from 6 mm to 10 x 9 mm.   She does have a history of smoking both tobacco and marijuana.  She does not use either anymore.  She smoked about half a pack a day for 20 years before quitting in 1991.  She complains of shortness of breath and tightness in her chest with exertion.  She cannot walk up a flight of stairs.  Usually resolves with rest for 5 to 10 minutes.  No change in appetite or weight loss.  She does have a history of reflux with frequent heartburn.  Different than the sensation she gets when she is walking.  Sometimes walks with a cane due to arthritis in her hips.   I saw her in the office about 3 weeks ago.  Recommended PET/CT and pulmonary function testing.  Also recommended we have her see cardiology given her dyspnea and chest tightness with exertion.  She had the PET/CT and PFTs, but has not seen cardiology yet.  There is no appointment scheduled currently.  Dr. Luna Salinas reviewed the patient's diagnostic studies and determined she would benefit from surgical intervention. He reviewed the patient's treatment options as well as the risks and benefits of surgery with the patient. Sophia [name] was agreeable to surgery.  Hospital Course:  Sophia [name] presented to [facility] and was brought to the operating room on 05/014/25. She underwent robotic assisted right lower lobe wedge resection and lymph node biopsy. She tolerated the procedure well and was  transferred to the PACU in stable condition.  The patient did well post operatively.  Her chest tube was on water  seal without evidence of an air leak.  Her chest xray was free from pneumothorax and pleural effusion.  Her chest tube was removed on POD #1.  Follow up chest xray showed low lung volumes, bibasilar atelectasis, and no pneumothorax.  She has not yet been resumed on her home antihypertensive medications as BP is soft.  Of note, she is on duplicate blood pressure medication at home and will need to follow up with primary care physician to sort this out.  Her home depression medications have been resumed.  She is ambulating without difficulty on room air.  Her incisions are healing without evidence of infection.  She has minor sero sanguinous drainage from chest tube wound.  CXR done 05/17 showed no pneumothorax, minimal left pleural effusion, stable right chest wall emphysema, and atelectasis at bases. I did discuss with her that she is on multiple BP medications. Based on her BP in the hospital, she will be gradually be resumed on her home medications and she should follow up with PCP if there are any problems or questions. She is medically stable for discharge home today.

## 2024-03-31 NOTE — Interval H&P Note (Signed)
 History and Physical Interval Note:  03/31/2024 7:14 AM  Sophia [name]  has presented today for surgery, with the diagnosis of RLL LUNG NODULE.  The various methods of treatment have been discussed with the patient and family. After consideration of risks, benefits and other options for treatment, the patient has consented to  Procedure(s) with comments: VIDEO ASSISTED THORACOSCOPY (VATS)/WEDGE RESECTION (Right) - RIGHT LOWER LOBE WEDGE RESECTION WITH LYMPH NODE SAMPLING as a surgical intervention.  The patient's history has been reviewed, patient examined, no change in status, stable for surgery.  I have reviewed the patient's chart and labs.  Questions were answered to the patient's satisfaction.     Zelphia Higashi

## 2024-03-31 NOTE — Brief Op Note (Signed)
 03/31/2024  11:13 AM  PATIENT:  Sophia [name]  73 y.o. adult  PRE-OPERATIVE DIAGNOSIS:  RIGHT LOWER LOBE LUNG NODULE  POST-OPERATIVE DIAGNOSIS:  RIGHT LOWER LOBE LUNG NODULE  PROCEDURE:  RLL WEDGE RESECTION, LUNG, ROBOT-ASSISTED, THORACOSCOPIC LYMPH NODE BIOPSY (Right) BLOCK, NERVE, INTERCOSTAL (Right)  SURGEON:  Surgeons and Role:    * Zelphia Higashi, MD - Primary  PHYSICIAN ASSISTANT: Debroah Fanning PA-C  ASSISTANTS: none   ANESTHESIA:   local and general  EBL: minimal   BLOOD ADMINISTERED:none  DRAINS: right chest tube   LOCAL MEDICATIONS USED:  OTHER Exparel  SPECIMEN:  Source of Specimen:  right lower lobe wedge  DISPOSITION OF SPECIMEN:  PATHOLOGY  COUNTS:  YES  DICTATION: .Dragon Dictation  PLAN OF CARE: Admit to inpatient   PATIENT DISPOSITION:  PACU - hemodynamically stable.   Delay start of Pharmacological VTE agent (>24hrs) due to surgical blood loss or risk of bleeding: no

## 2024-03-31 NOTE — Anesthesia Procedure Notes (Signed)
 Procedure Name: Intubation Date/Time: 03/31/2024 8:30 AM  Performed by: Artemisa Bile D, CRNAPre-anesthesia Checklist: Patient identified, Emergency Drugs available, Suction available and Patient being monitored Patient Re-evaluated:Patient Re-evaluated prior to induction Oxygen Delivery Method: Circle System Utilized Preoxygenation: Pre-oxygenation with 100% oxygen Induction Type: IV induction Ventilation: Mask ventilation without difficulty and Oral airway inserted - appropriate to patient size Laryngoscope Size: Annabell Key and 2 Grade View: Grade I Tube type: Oral Endobronchial tube: Left and 37 Fr Number of attempts: 1 Airway Equipment and Method: Stylet and Oral airway Placement Confirmation: ETT inserted through vocal cords under direct vision, positive ETCO2 and breath sounds checked- equal and bilateral Secured at: 27 cm Tube secured with: Tape Dental Injury: Teeth and Oropharynx as per pre-operative assessment

## 2024-03-31 NOTE — H&P (Signed)
 HPI: Sophia [name] returns to discuss the results of her PET/CT and pulmonary function testing.   Sophia [name] is a 73 year old woman with a history of tobacco use, hypertension, hyperlipidemia, type 2 diabetes, obesity, sleep apnea, COVID-19, coronary calcification, aortic atherosclerosis, reflux, arthritis, anxiety, and depression.   She was noted to have a lung nodule at the base of the right lower lobe on a CT of the abdomen in 2023.  She recently saw Dr. Rodolph Clap and had a follow-up scan which showed the nodule had increased in size from 6 mm to 10 x 9 mm.   She does have a history of smoking both tobacco and marijuana.  She does not use either anymore.  She smoked about half a pack a day for 20 years before quitting in 1991.  She complains of shortness of breath and tightness in her chest with exertion.  She cannot walk up a flight of stairs.  Usually resolves with rest for 5 to 10 minutes.  No change in appetite or weight loss.  She does have a history of reflux with frequent heartburn.  Different than the sensation she gets when she is walking.  Sometimes walks with a cane due to arthritis in her hips.   I saw her in the office about 3 weeks ago.  Recommended PET/CT and pulmonary function testing.  Also recommended we have her see cardiology given her dyspnea and chest tightness with exertion.  She had the PET/CT and PFTs, but has not seen cardiology yet.  There is no appointment scheduled currently.   Zubrod Score: At the time of surgery this patient's most appropriate activity status/level should be described as: []     0    Normal activity, no symptoms [x]     1    Restricted in physical strenuous activity but ambulatory, able to do out light work []     2    Ambulatory and capable of self care, unable to do work activities, up and about >50 % of waking hours                              []     3    Only limited self care, in bed greater than 50% of waking hours []     4    Completely disabled, no  self care, confined to bed or chair []     5    Moribund       Past Medical History:  Diagnosis Date   Acute respiratory disease due to COVID-19 virus 08/28/2019   Anxiety     Arthritis     Depression     Diabetes mellitus type 2 in obese 11/28/2010    Qualifier: Diagnosis of  By: Rodolph Clap MD, Margaret  Diet controlled in 12/2016    Diabetes mellitus without complication (HCC)     GERD (gastroesophageal reflux disease)     Hypercholesteremia     Hypertension     PONV (postoperative nausea and vomiting)     Sleep apnea               Past Surgical History:  Procedure Laterality Date   ABDOMINAL HYSTERECTOMY   11/18/1980    tubes and womb, bleeding and ectopic   ABDOMINAL SURGERY        removal of tumors   BALLOON DILATION N/A 02/16/2021    Procedure: BALLOON DILATION;  Surgeon: Vinetta Greening, DO;  [facility];  Service: Endoscopy;  Laterality: N/A;   BIOPSY   02/16/2021    Procedure: BIOPSY;  Surgeon: Vinetta Greening, DO;  Location: [facility];  Service: Endoscopy;;   BREAST SURGERY Left 11/18/1981    benign tumor   CATARACT EXTRACTION W/PHACO Right 01/11/2014    Procedure: CATARACT EXTRACTION PHACO AND INTRAOCULAR LENS PLACEMENT (IOC);  Surgeon: Lavonia Powers T. Gennie Kicks, MD;  Location: [facility];  Service: Ophthalmology;  Laterality: Right;  CDE 5.57   CATARACT EXTRACTION W/PHACO Left 01/25/2014    Procedure: CATARACT EXTRACTION PHACO AND INTRAOCULAR LENS PLACEMENT (IOC);  Surgeon: Lavonia Powers T. Gennie Kicks, MD;  Location: [facility];  Service: Ophthalmology;  Laterality: Left;  CDE:5.19   COLONOSCOPY   2007 [facility]    NL EXAM   COLONOSCOPY N/A 04/22/2014    Dr. Fields:Normal mucosa in the terminal ileum/Two COLON polyps REMOVED/ Mild diverticulosis in the ascending colon and transverse colon/The LEFT colon IS redundant/Small internal hemorrhoids. Path: tubular adenoma. Next colonoscopy in 5-10 years   COLONOSCOPY WITH PROPOFOL  N/A 11/27/2020    Surgeon: Goble Last K, DO;   Nonbleeding internal hemorrhoids, one 6 mm transverse colon polyp resected, diverticulosis in the sigmoid colon.  Pathology with tubular adenoma.  Repeat in 5 years.   ESOPHAGOGASTRODUODENOSCOPY N/A 04/22/2014    Dr. Fields:MICROCYTIC ANEMIA MOST LIKELY DUE TO ASA/VOLTAREN /Small hiatal hernia/MODERATE Non-erosive gastritis. Negative H.pylori   ESOPHAGOGASTRODUODENOSCOPY (EGD) WITH PROPOFOL  N/A 02/16/2021    Surgeon: Vinetta Greening, DO; benign-appearing esophageal stenosis s/p dilation, gastritis biopsied.  Biopsies were benign.   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 Gennie Kicks, MD;  Location: [facility];  Service: Ophthalmology;  Laterality: Right;   YAG LASER APPLICATION Left 08/15/2015    Procedure: YAG LASER APPLICATION;  Surgeon: Albert Huff, MD;  Location: [facility];  Service: Ophthalmology;  Laterality: Left;                Current Outpatient Medications  Medication Sig Dispense Refill   acetaminophen  (TYLENOL ) 650 MG CR tablet Take 650 mg by mouth 2 (two) times daily.       albuterol  (VENTOLIN  HFA) 108 (90 Base) MCG/ACT inhaler Inhale 2 puffs into the lungs every 6 (six) hours as needed for wheezing or shortness of breath. 8 g 0   amLODipine  (NORVASC ) 10 MG tablet  Take 1 tablet (10 mg total) by mouth daily. 90 tablet 3   aspirin  EC 81 MG tablet Take 81 mg by mouth daily. Swallow whole.       benazepril -hydrochlorthiazide (LOTENSIN  HCT) 20-12.5 MG tablet TAKE 2 TABLETS EVERY DAY 180 tablet 3   busPIRone  (BUSPAR ) 10 MG tablet Take 1 tablet (10 mg total) by mouth 3 (three) times daily. 270 tablet 3   chlorthalidone  (HYGROTON ) 25 MG  tablet TAKE 1 TABLET EVERY DAY 90 tablet 3   Cyanocobalamin  (VITAMIN B-12 PO) Take 2 gummies daily.       ferrous gluconate (FERGON) 324 MG tablet Take 324 mg by mouth daily with breakfast.       FLUoxetine  (PROZAC ) 40 MG capsule Take 1 capsule (40 mg total) by mouth daily. 90 capsule 3   Melatonin 1 MG CAPS Takes two 2 mg gummies daily       Multiple Vitamin (MULTIVITAMIN) tablet Take 1 tablet by mouth daily.       olmesartan -hydrochlorothiazide  (BENICAR  HCT) 20-12.5 MG tablet Take 1 tablet by mouth daily. 30 tablet 11   omeprazole  (PRILOSEC) 40 MG capsule Take 40 mg by mouth 2 (two) times daily.       OVER THE COUNTER MEDICATION Black seed as needed       polyethylene glycol (MIRALAX / GLYCOLAX) 17 g packet Take 17 g by mouth daily as needed.       rosuvastatin  (CRESTOR ) 20 MG tablet TAKE 1 TABLET EVERY DAY 90 tablet 3   sucralfate  (CARAFATE ) 1 g tablet TAKE 1 TABLET TWICE DAILY 180 tablet 3      No current facility-administered medications for this visit.      Social History         Socioeconomic History   Marital status: Married      Spouse name: Not on file   Number of children: Not on file   Years of education: Not on file   Highest education level: Not on file  Occupational History   Not on file  Tobacco Use   Smoking status: Former      Current packs/day: 0.00      Average packs/day: 0.3 packs/day for 20.0 years (5.0 ttl pk-yrs)      Types: Cigarettes      Start date: 01/05/1970      Quit date: 01/05/1990      Years since quitting: 34.0   Smokeless tobacco: Former      Types: Snuff      Quit date:  03/28/2022   Tobacco comments:      Client said she no longer is using snuff. She uses marijuana occasionally per client  Vaping Use   Vaping status: Never Used  Substance and Sexual Activity   Alcohol use: No   Drug use: Yes      Types: Marijuana      Comment: occ marijuana   Sexual activity: Not Currently      Birth control/protection: Surgical  Other Topics Concern   Not on file  Social History Narrative   Not on file    Social Drivers of Health        Financial Resource Strain: Low Risk  (10/22/2023)    Overall Financial Resource Strain (CARDIA)     Difficulty of Paying Living Expenses: Not hard at all  Food Insecurity: No Food Insecurity (10/22/2023)    Hunger Vital Sign     Worried About Running Out of Food in the Last Year: Never true     Ran Out of Food in the Last Year: Never true  Transportation Needs: No Transportation Needs (10/22/2023)    PRAPARE - Therapist, art (Medical): No     Lack of Transportation (Non-Medical): No  Physical Activity: Insufficiently Active (10/22/2023)    Exercise Vital Sign     Days of Exercise per Week: 3 days     Minutes of Exercise per Session: 30  min  Stress: No Stress Concern Present (10/22/2023)    [employer] of Occupational Health - Occupational Stress Questionnaire     Feeling of Stress : Not at all  Social Connections: Moderately Integrated (10/22/2023)    Social Connection and Isolation Panel [NHANES]     Frequency of Communication with Friends and Family: More than three times a week     Frequency of Social Gatherings with Friends and Family: More than three times a week     Attends Religious Services: More than 4 times per year     Active Member of [employer] or Organizations: No     Attends Banker Meetings: Never     Marital Status: Married  Catering manager Violence: Not At Risk (10/22/2023)    Humiliation, Afraid, Rape, and Kick questionnaire     Fear of Current or Ex-Partner: No      Emotionally Abused: No     Physically Abused: No     Sexually Abused: No      Physical Exam BP (!) 104/57 (BP Location: Right Arm, Patient Position: Sitting, Cuff Size: Normal)   Pulse 90   Resp 20   Ht 5\' 9"  (1.753 m)   Wt 199 lb (90.3 kg)   SpO2 92% Comment: RA  BMI 29.39 kg/m  Obese 73 year old woman in no acute distress Alert and oriented x 3 with no focal deficit No cervical or supraclavicular adenopathy Cardiac regular rate and rhythm with no murmur Lungs clear bilaterally No clubbing, cyanosis, or edema.   Diagnostic Tests: NUCLEAR MEDICINE PET SKULL BASE TO THIGH   TECHNIQUE: 9.9 mCi F-18 FDG was injected intravenously. Full-ring PET imaging was performed from the skull base to thigh after the radiotracer. CT data was obtained and used for attenuation correction and anatomic localization.   Fasting blood glucose: 109 mg/dl   COMPARISON:  CT chest 12/13/2023, MR abdomen 11/03/2023, CT abdomen 08/11/2022 and 12/24/2020.   FINDINGS: Mediastinal blood pool activity: SUV max 2.8   Liver activity: SUV max NA   NECK:   No abnormal hypermetabolism.   Incidental CT findings:   None.   CHEST:   10 mm posterolateral right lower lobe nodule (7/40), SUV max 2.3. No additional abnormal hypermetabolism.   Incidental CT findings:   Atherosclerotic calcification of the aorta, aortic valve and coronary arteries. Enlarged pulmonic trunk and heart. Small pericardial effusion, minimally improved.   ABDOMEN/PELVIS:   No abnormal hypermetabolism.   Incidental CT findings:   2.0 cm left adrenal nodule measures 5 Hounsfield units. No specific follow-up necessary. Punctate right renal stone.   SKELETON:   No abnormal hypermetabolism.   Incidental CT findings:   Degenerative changes in the spine.   IMPRESSION: 1. 10 mm right lower lobe nodule does not show metabolism above blood pool but has enlarged from 08/11/2022. Low-grade adenocarcinoma cannot be  definitively excluded. Recommend short-term follow-up CT chest without contrast in 3 months. 2. Small pericardial effusion, minimally improved. 3. Left adrenal adenoma. 4. Punctate right renal stone. 5. Aortic atherosclerosis (ICD10-I70.0). Coronary artery calcification. 6. Enlarged pulmonic trunk, indicative of pulmonary arterial hypertension.     Electronically Signed   By: Shearon Denis M.D.   On: 01/26/2024 16:04 I personally reviewed the CT images.  Low-grade activity in the right lower lobe lung nodule with an SUV of 2.3.  Highly suspicious for a low-grade adenocarcinoma.  Enlarged pulmonic trunk, coronary and aortic atherosclerosis. Infectious and inflammatory nodules are also in the differential.   Pulmonary function testing  FVC 2.17 (61%) FEV1 1.75 (65%) FEV1 1.84 (69%) postbronchodilator DLCO 18.31 (80% Impression: Sophia [name] is a 73 year old woman with a history of tobacco use, hypertension, hyperlipidemia, type 2 diabetes, obesity, sleep apnea, COVID-19, coronary calcification, aortic atherosclerosis, reflux, arthritis, anxiety, depression, and a right lower lobe lung nodule.   Right lower lobe lung nodule-increase in size from 6 to 10 mm over a 2-year.  Low-grade uptake on PET/CT.  This is a new primary bronchogenic carcinoma until proven otherwise.  Infectious and/or inflammatory nodules are also in the differential.  Clinical stage would be 1A (T1, N0)   Given the peripheral location at the base of the right lower lobe this would be a difficult location to biopsy.  It is very favorable for wedge resection for both diagnosis and definitive treatment.   I recommended her that we proceed with a robotic right VATS for right lower lobe wedge resection and lymph node sampling.  I informed her of the general nature of the procedure including the need for general anesthesia, the incisions to be used, the use of the surgical robot, the use of a drainage tube postoperatively,  the expected hospital stay, and the overall recovery.  I informed her of the indications, risks, benefits, and alternatives.  She understands the risks include, but not limited to death, MI, DVT, PE, bleeding, possible need for transfusion, infection, air leaks, cardiac arrhythmias, as well as the possibility of other procedural complications.   Her dyspnea seems out of proportion to her pulmonary function testing and she does have some evidence of coronary atherosclerosis on CT.  She had a negative catheterization back in 2019.  I think she needs to be seen by cardiology prior to surgery.  Will try to arrange that as soon as possible.   Plan: Will schedule for robotic assisted right VATS for right lower lobe wedge resection and lymph node sampling once cleared by cardiology.   Zelphia Higashi, MD [facility] [phone]                Electronically signed by Zelphia Higashi, MD at 01/27/2024  3:35 PM   In interim since I last saw her, her husband passed away.  Understandably has been dealing with that situation.  Exam unchanged  Cleared by cardiology to proceed  Mayia Megill C. Luna Salinas, MD [facility] [phone]

## 2024-03-31 NOTE — TOC CM/SW Note (Signed)
 Transition of Care [facility]) - Inpatient Brief Assessment   Patient Details  Name: Sophia [name] MRN: [id number] Date of Birth: 1951/05/14  [facility]) CM/SW Contact:    Juliane Och, LCSW Phone Number: 03/31/2024, 1:40 PM   Clinical Narrative:  1:40 PM Per chart review, patient resides at home with her adult children. Patient has a PCP and insurance. Patient does not have SNF/HH/DME history. No TOC needs were identified at this time. TOC will continue to follow and be available to assist.  Transition of Care Asessment: Insurance and Status: Insurance coverage has been reviewed Patient has primary care physician: Yes Home environment has been reviewed: Private Residence Prior level of function:: N/A Prior/Current Home Services: No current home services Social Drivers of Health Review: SDOH reviewed no interventions necessary Readmission risk has been reviewed: Yes Transition of care needs: no transition of care needs at this time

## 2024-03-31 NOTE — Anesthesia Procedure Notes (Signed)
 Arterial Line Insertion Start/End5/14/2025 8:00 AM Performed by: Peggi Bowels, MD, Alcus Amabile, CRNA, CRNA  Patient location: Pre-op. Preanesthetic checklist: patient identified, IV checked, site marked, risks and benefits discussed, surgical consent, monitors and equipment checked, pre-op evaluation, timeout performed and anesthesia consent Lidocaine  1% used for infiltration Right, radial was placed Catheter size: 20 G Hand hygiene performed  and maximum sterile barriers used   Attempts: 1 Procedure performed without using ultrasound guided technique. Following insertion, dressing applied. Post procedure assessment: normal and unchanged  Patient tolerated the procedure well with no immediate complications.

## 2024-04-01 ENCOUNTER — Inpatient Hospital Stay (HOSPITAL_COMMUNITY)

## 2024-04-01 ENCOUNTER — Encounter (HOSPITAL_COMMUNITY): Payer: Self-pay | Admitting: Thoracic Surgery (Cardiothoracic Vascular Surgery)

## 2024-04-01 LAB — BASIC METABOLIC PANEL WITH GFR
Anion gap: 13 (ref 5–15)
BUN: 18 mg/dL (ref 8–23)
CO2: 25 mmol/L (ref 22–32)
Calcium: 8.8 mg/dL — ABNORMAL LOW (ref 8.9–10.3)
Chloride: 101 mmol/L (ref 98–111)
Creatinine, Ser: 1.09 mg/dL — ABNORMAL HIGH (ref 0.44–1.00)
GFR, Estimated: 54 mL/min — ABNORMAL LOW (ref >60)
Glucose, Bld: 122 mg/dL — ABNORMAL HIGH (ref 70–99)
Potassium: 4.4 mmol/L (ref 3.5–5.1)
Sodium: 139 mmol/L (ref 135–145)

## 2024-04-01 LAB — SURGICAL PATHOLOGY

## 2024-04-01 LAB — CBC
HCT: 31.4 % — ABNORMAL LOW (ref 36.0–46.0)
Hemoglobin: 9.9 g/dL — ABNORMAL LOW (ref 12.0–15.0)
MCH: 26.1 pg (ref 26.0–34.0)
MCHC: 31.5 g/dL (ref 30.0–36.0)
MCV: 82.6 fL (ref 80.0–100.0)
Platelets: 177 10*3/uL (ref 150–400)
RBC: 3.8 MIL/uL — ABNORMAL LOW (ref 3.87–5.11)
RDW: 16.6 % — ABNORMAL HIGH (ref 11.5–15.5)
WBC: 8.1 10*3/uL (ref 4.0–10.5)
nRBC: 0 % (ref 0.0–0.2)

## 2024-04-01 LAB — GLUCOSE, CAPILLARY
Glucose-Capillary: 113 mg/dL — ABNORMAL HIGH (ref 70–99)
Glucose-Capillary: 114 mg/dL — ABNORMAL HIGH (ref 70–99)

## 2024-04-01 NOTE — Progress Notes (Addendum)
      [address]       Sophia [name] [address]             [phone]      1 Day Post-Op Procedure(s) (LRB): WEDGE RESECTION, LUNG, ROBOT-ASSISTED, THORACOSCOPIC LYMPH NODE BIOPSY (Right) BLOCK, NERVE, INTERCOSTAL (Right)  Subjective:  Patient doing okay.  Has some soreness.  She denies N/V.  Objective: Vital signs in last 24 hours: Temp:  [97.6 F (36.4 C)-98.6 F (37 C)] 97.7 F (36.5 C) (05/15 0724) Pulse Rate:  [67-81] 73 (05/15 0724) Cardiac Rhythm: Normal sinus rhythm (05/15 0700) Resp:  [10-19] 17 (05/15 0724) BP: (94-131)/(41-63) 131/62 (05/15 0724) SpO2:  [94 %-100 %] 97 % (05/15 0724) Arterial Line BP: (131-148)/(59-67) 131/59 (05/14 1115)  Intake/Output from previous day: 05/14 0701 - 05/15 0700 In: 1920.2 [P.O.:170; I.V.:1650.2; IV Piggyback:100] Out: 3208 [Urine:3050; Chest Tube:158] Intake/Output this shift: Total I/O In: 890.8 [P.O.:120; I.V.:770.8] Out: -   General appearance: alert, cooperative, and no distress Heart: regular rate and rhythm Lungs: clear to auscultation bilaterally Abdomen: soft, non-tender; bowel sounds normal; no masses,  no organomegaly Wound: clean and dry  Lab Results: Recent Labs    03/29/24 1400 04/01/24 0230  WBC 6.2 8.1  HGB 11.5* 9.9*  HCT 36.5 31.4*  PLT 215 177   BMET:  Recent Labs    03/29/24 1400 04/01/24 0230  NA 143 139  K 3.9 4.4  CL 104 101  CO2 30 25  GLUCOSE 136* 122*  BUN 35* 18  CREATININE 1.19* 1.09*  CALCIUM  9.8 8.8*    PT/INR:  Recent Labs    03/29/24 1400  LABPROT 13.5  INR 1.0   ABG No results found for: "PHART", "HCO3", "TCO2", "ACIDBASEDEF", "O2SAT" CBG (last 3)  Recent Labs    03/31/24 1735 03/31/24 2334 04/01/24 0605  GLUCAP 149* 133* 113*    Assessment/Plan: S/P Procedure(s) (LRB): WEDGE RESECTION, LUNG, ROBOT-ASSISTED, THORACOSCOPIC LYMPH NODE BIOPSY (Right) BLOCK, NERVE, INTERCOSTAL (Right)  CV- NSR, H/O HTN- home Norvasc , hydrochlorothiazide ,  Chlorthalidone , and Benazepril  have been ordered Pulm- wean oxygen as tolerated, CT on water  seal w/o air leak.... output is at 250 cc.Sophia [name] CXR w/o pneumothorax.... as discussed with Dr. Luna [name] will d/c chest tube today Renal- creatinine improved from preadmission at 1.09, not on Toradol , D/C Foley catheter today H/O Depression/Anxiety- on home Buspar , Prozac  Lovenox  for DVT prophylaxis D/C IV fluids Dispo- patient stable, no air leak, cxr w/o pneumothorax.. d/c chest tube today, possibly for d/c tomorrow   LOS: 1 day    Sophia Kasal, PA-C 04/01/2024 Patient seen and examined, agree with findings and plan outlined above Dc chest tube Ambulate SCD + enoxaparin  Likely home in a day or two  Sophia Pinion C. Luna Salinas, MD [facility] [phone]

## 2024-04-01 NOTE — Op Note (Signed)
 Sophia [name], Sophia [name] MEDICAL RECORD NO: [id number] ACCOUNT NO: [id number] DATE OF BIRTH: 20-Mar-1951 FACILITY: [facility] LOCATION: [facility] PHYSICIAN: Milon Aloe. Luna Salinas, MD  Operative Report   DATE OF PROCEDURE: 03/31/2024  PREOPERATIVE DIAGNOSIS:  Right lower lobe lung nodule.  POSTOPERATIVE DIAGNOSIS:  Right lower lobe lung nodule.  PROCEDURE:   Xi robotic-assisted right lower lobe wedge resection, Lymph node dissection, and  Intercostal nerve blocks levels 3 through 10.  SURGEON:  Milon Aloe. Luna Salinas, MD  ASSISTANT:  Debroah Fanning, PA  Experienced assistance was necessary for this case due to surgical complexity.  Debroah Fanning assisted with port placement, camera management, robot docking and undocking, instrument exchange, specimen retrieval, suctioning, and wound closure.    ANESTHESIA:  General.  FINDINGS:  Nodule clearly visible on the inferolateral margin of right lower lobe, resected with 2.5 cm gross margin, normal-appearing lymph nodes.  CLINICAL NOTE:  Sophia [name] is a 73 year old woman with a history of tobacco use.  She was noted to have a lung nodule back in 2023.  Over time, the nodule had slowly increased in size from approximately 6-10 mm.  She had a PET CT which showed low-grade activity with an SUV of 2.3.  Given the increase in size, she was advised to undergo surgical resection as this was consistent with an early stage IA (T1, N0) non-small cell carcinoma.  The indications, risks, benefits, and alternatives were  discussed in detail with the patient.  She understood and accepted the risks and agreed to proceed.  OPERATIVE NOTE:  Sophia [name] was brought to the operating room on 03/31/2024.  She had induction of general anesthesia and was intubated with a double-lumen endotracheal tube. Intravenous antibiotics were administered.  A Foley catheter was placed.   Sequential compression devices were placed on the calves for DVT prophylaxis. She was placed in a left  lateral decubitus position.  A Bair Hugger was placed for active warming.  The right chest was prepped and draped in the usual sterile fashion.  Single lung ventilation of the left lung was initiated and was tolerated well throughout the procedure.  A timeout was performed.  A solution containing 20 mL of liposomal bupivacaine , 30 mL of 0.5% bupivacaine , and 50 mL of saline was prepared.  This solution was used for local at the incision sites as well as for the intercostal nerve blocks. An incision was made in the eighth interspace, approximately midaxillary line.  An 8 mm robotic port was placed into the chest.  The scope was advanced into the chest.  After confirming intrapleural placement, carbon dioxide was insufflated per protocol.  A 12 mm robotic port was placed in the eighth interspace anterior to the camera port.  Intercostal nerve blocks were performed from the third to the tenth interspace by injecting 10 mL of bupivacaine  solution into the subpleural plane at each level.  Two additional eighth interspace robotic ports were placed and an AirSeal port was placed in the tenth interspace.  The robot was deployed.  The camera arm was docked.  Targeting was performed.  The remaining arms were docked.  The robotic instruments were  inserted with thoracoscopic visualization.  Inspection of the lung showed the nodule along the inferior margin of the right lower lobe laterally.  A wedge resection was performed, maintaining a greater than 2 cm gross margin.  The specimen was placed into an 8 mm endoscopic retrieval bag, removed, and sent for frozen section.  While awaiting the results of the frozen section, the lymph  node dissection was performed.  The lung was retracted superiorly.  The lymph node dissection was done using bipolar cautery.  The inferior ligament was divided.  A level 9 node was removed.  This node was relatively large but completely benign in appearance.  All lymph nodes were sent as separate  specimens for permanent pathology.  The lung then was retracted anteriorly and the pleural reflection was divided at the hilum posteriorly.  Level 8 and two level 7 nodes were removed.  Inspection at the bifurcation of the bronchus intermedius from the right upper lobe bronchus did not reveal a node that was accessible.  The lung then was retracted inferiorly.  The pleural reflection was divided between the azygos vein and the hilum and a level 10 node was removed.  The pleura overlying the anterior mediastinum superior to the azygos was incised and a level 4R node was removed.  Inspection of the fissure showed a level 11 node at the bifurcation of the middle and lower lobe bronchi.  This node was removed.  At this point, the frozen section returned.  It showed an atypical pneumocyte proliferation suspicious for but not definitively diagnostic for adenocarcinoma on frozen section.  The margin was clear.  The sponges used during the dissection were removed.  The chest was copiously irrigated with saline.  The robotic instruments were removed.  The robot was undocked.  The port sites were inspected for hemostasis.  A 28-French Blake drain was placed through the most anterior port incision and directed to the apex.  It was secured with a #1 silk suture. Dual lung ventilation was resumed.  The remaining port incisions were closed in standard fashion.  The chest tube was placed to a Pleur-evac on waterseal.  The patient was placed back in the supine position.  She was extubated in the operating room and taken to the post-anesthetic care unit in good condition.  All sponge, needle, and instrument counts were correct at the end  of the procedure.   SHW D: 03/31/2024 5:54:11 pm T: 04/01/2024 5:49:00 am  JOB: [id number]/ [id number]

## 2024-04-01 NOTE — Progress Notes (Signed)
 Mobility Specialist Progress Note;   04/01/24 1455  Mobility  Activity Ambulated with assistance in hallway;Ambulated with assistance to bathroom;Transferred from bed to chair  Level of Assistance Minimal assist, patient does 75% or more  Assistive Device Front wheel walker  Distance Ambulated (ft) 200 ft  Activity Response Tolerated well  Mobility Referral Yes  Mobility visit 1 Mobility  Mobility Specialist Start Time (ACUTE ONLY) 1455  Mobility Specialist Stop Time (ACUTE ONLY) 1518  Mobility Specialist Time Calculation (min) (ACUTE ONLY) 23 min   RN requesting pt to ambulate, pt agreeable. Required MinA for initial STS from EoB and SV during ambulation. Pt requested to use BR at BoS, void successful. VSS on RA w/ no c/o. Pt requested to sit in chair once returned back to room. Pt left in chair with all needs met, call bell in reach. Family present.   Janit Meline Mobility Specialist Please contact via SecureChat or [employer] [phone]

## 2024-04-01 NOTE — Plan of Care (Signed)

## 2024-04-01 NOTE — Progress Notes (Signed)
      [address]       [place] [address]             [phone]        CXR reviewed post chest tube removal... No evidence of significant pneumothorax or pleural effusion  Gates Kasal, PA-C 1:04 PM 04/01/24

## 2024-04-02 ENCOUNTER — Inpatient Hospital Stay (HOSPITAL_COMMUNITY)

## 2024-04-02 ENCOUNTER — Ambulatory Visit: Admitting: Family Medicine

## 2024-04-02 ENCOUNTER — Ambulatory Visit: Payer: Self-pay | Admitting: Professional Counselor

## 2024-04-02 LAB — COMPREHENSIVE METABOLIC PANEL WITH GFR
ALT: 46 U/L — ABNORMAL HIGH (ref 0–44)
AST: 27 U/L (ref 15–41)
Albumin: 3.5 g/dL (ref 3.5–5.0)
Alkaline Phosphatase: 47 U/L (ref 38–126)
Anion gap: 8 (ref 5–15)
BUN: 29 mg/dL — ABNORMAL HIGH (ref 8–23)
CO2: 30 mmol/L (ref 22–32)
Calcium: 9.2 mg/dL (ref 8.9–10.3)
Chloride: 101 mmol/L (ref 98–111)
Creatinine, Ser: 1.13 mg/dL — ABNORMAL HIGH (ref 0.44–1.00)
GFR, Estimated: 51 mL/min — ABNORMAL LOW (ref >60)
Glucose, Bld: 111 mg/dL — ABNORMAL HIGH (ref 70–99)
Potassium: 3.6 mmol/L (ref 3.5–5.1)
Sodium: 139 mmol/L (ref 135–145)
Total Bilirubin: 0.4 mg/dL (ref 0.0–1.2)
Total Protein: 6.4 g/dL — ABNORMAL LOW (ref 6.5–8.1)

## 2024-04-02 LAB — CBC
HCT: 31.1 % — ABNORMAL LOW (ref 36.0–46.0)
Hemoglobin: 9.9 g/dL — ABNORMAL LOW (ref 12.0–15.0)
MCH: 26.3 pg (ref 26.0–34.0)
MCHC: 31.8 g/dL (ref 30.0–36.0)
MCV: 82.5 fL (ref 80.0–100.0)
Platelets: 186 10*3/uL (ref 150–400)
RBC: 3.77 MIL/uL — ABNORMAL LOW (ref 3.87–5.11)
RDW: 16.8 % — ABNORMAL HIGH (ref 11.5–15.5)
WBC: 9.1 10*3/uL (ref 4.0–10.5)
nRBC: 0 % (ref 0.0–0.2)

## 2024-04-02 MED ORDER — POTASSIUM CHLORIDE CRYS ER 20 MEQ PO TBCR
40.0000 meq | EXTENDED_RELEASE_TABLET | Freq: Once | ORAL | Status: AC
  Administered 2024-04-02: 40 meq via ORAL
  Filled 2024-04-02: qty 2

## 2024-04-02 NOTE — Plan of Care (Signed)

## 2024-04-02 NOTE — Progress Notes (Addendum)
 Pt to Xray for chest Xray with xray tech. Order verified prior to pt leaving the unit. Pt refused CHG bath and wanted to use soap and water  to bathe. Large BM this morning. Pt ambulated without distress.  Pt returned to bed 2C 01.

## 2024-04-02 NOTE — Progress Notes (Signed)
      [address]       Sophia [name] [address]             [phone]       2 Days Post-Op Procedure(s) (LRB): WEDGE RESECTION, LUNG, ROBOT-ASSISTED, THORACOSCOPIC LYMPH NODE BIOPSY (Right) BLOCK, NERVE, INTERCOSTAL (Right)  Subjective: Patient has already walked this am. She has some incisional pain. She would like to go home in am  Objective: Vital signs in last 24 hours: Temp:  [98 F (36.7 C)-98.5 F (36.9 C)] 98.5 F (36.9 C) (05/16 0753) Pulse Rate:  [65-72] 70 (05/16 0753) Cardiac Rhythm: Normal sinus rhythm (05/16 0700) Resp:  [12-21] 16 (05/16 0753) BP: (92-117)/(48-71) 92/49 (05/16 0753) SpO2:  [93 %-99 %] 93 % (05/16 0753)     Intake/Output from previous day: 05/15 0701 - 05/16 0700 In: 1250.8 [P.O.:480; I.V.:770.8] Out: 501 [Urine:400; Stool:1; Chest Tube:100]   Physical Exam:  Cardiovascular: RRR Pulmonary: Clear to auscultation bilaterally Abdomen: Soft, non tender, bowel sounds present. Extremities: Trace bilateral lower extremity edema. Wounds: Clean and dry.  No erythema or signs of infection.  Lab Results: CBC: Recent Labs    04/01/24 0230 04/02/24 0300  WBC 8.1 9.1  HGB 9.9* 9.9*  HCT 31.4* 31.1*  PLT 177 186   BMET:  Recent Labs    04/01/24 0230 04/02/24 0300  NA 139 139  K 4.4 3.6  CL 101 101  CO2 25 30  GLUCOSE 122* 111*  BUN 18 29*  CREATININE 1.09* 1.13*  CALCIUM  8.8* 9.2    PT/INR: No results for input(s): "LABPROT", "INR" in the last 72 hours. ABG:  INR: Will add last result for INR, ABG once components are confirmed Will add last 4 CBG results once components are confirmed  Assessment/Plan:  1. CV - SR. On Amlodipine  10 mg daily, Benazepril  40 mg daily, Hydrochlorothiazide  25 mg daily, and Chlorthalidone . With soft BP this am, will hold all of the above. 2.  Pulmonary - On room air. Chest tube removed yesterday. PA/LAT CXR this am appears to show minor right lateral chest wall subcutaneous  emphysema and bibasilar atelectasis. Encourage incentive spirometer. Final pathology: TNM Code: pT2a, pN0 (Dr. Luna Salinas will discuss at office visit). 3. Supplement potassium 4. Anemia-H and H this am stable at 9.9 and 31.1. Continue oral iron 5. On Lovenox  for DVT prophylaxis 6. Discharge in am  Traylen Eckels M ZimmermanPA-C 04/02/2024,8:05 AM

## 2024-04-02 NOTE — Plan of Care (Signed)
 Discussed with patient in front of family plan of care for the evening, pain management and medications with some teach back displayed.  She does want her Senna and Trazodone  medications tonight.  She has been encouraged to use her Incentive Spirometry by family.   Problem: Education: Goal: Knowledge of General Education information will improve Description: Including pain rating scale, medication(s)/side effects and non-pharmacologic comfort measures Outcome: Progressing   Problem: Health Behavior/Discharge Planning: Goal: Ability to manage health-related needs will improve Outcome: Progressing   Problem: Pain Managment: Goal: General experience of comfort will improve and/or be controlled Outcome: Progressing

## 2024-04-02 NOTE — Evaluation (Signed)
 Occupational Therapy Evaluation and Discharge Patient Details Name: Sophia [name] MRN: [id number] DOB: 11-Jul-1951 Today's Date: 04/02/2024   History of Present Illness   73 y.o. female presents to [facility] 03/31/24 for R VATS and RLL wedge resection with lymph node sampling. PMHx: tobacco use, hypertension, hyperlipidemia, type 2 diabetes, obesity, sleep apnea, COVID-19, coronary calcification, aortic atherosclerosis, reflux, arthritis, anxiety, and depression.     Clinical Impressions At baseline, pt is Independent with ADLs and IADLs and performs functional mobility Independent to Mod I with a SPC. At baseline, pt receives assistance from family for transportation. Pt now presents with mildly decreased activity tolerance a mildly decreased steadiness during functional mobility/transfers, but with pt at or very near baseline PLOF. Pt currently demonstrates ability to complete all ADLs Independent to Supervision for safety and functional mobility/transfers without an AD with Supervision for safety with no physical assist needed. Pt has 24/7 assist available at discharge and reports she is comfortable discharging home at current functional level. Pt with no further OT-related questions or concerns at this time. No further benefit from acute skilled OT services. No post-acute skilled OT needs are anticipated at this time. OT is signing off.      If plan is discharge home, recommend the following:   A little help with walking and/or transfers;A little help with bathing/dressing/bathroom;Assistance with cooking/housework;Help with stairs or ramp for entrance;Assist for transportation     Functional Status Assessment   Patient has had a recent decline in their functional status and demonstrates the ability to make significant improvements in function in a reasonable and predictable amount of time.     Equipment Recommendations   None recommended by OT (Pt already has needed equipment)      Recommendations for Other Services         Precautions/Restrictions   Precautions Precautions: Fall Restrictions Weight Bearing Restrictions Per Provider Order: No     Mobility Bed Mobility Overal bed mobility: Modified Independent                  Transfers Overall transfer level: Modified independent Equipment used: None                      Balance Overall balance assessment: Needs assistance Sitting-balance support: No upper extremity supported, Feet supported Sitting balance-Leahy Scale: Normal     Standing balance support: No upper extremity supported, During functional activity Standing balance-Leahy Scale: Good                             ADL either performed or assessed with clinical judgement   ADL Overall ADL's : Independent;Modified independent;Needs assistance/impaired Eating/Feeding: Independent;Sitting   Grooming: Modified independent;Standing   Upper Body Bathing: Modified independent;Sitting   Lower Body Bathing: Supervison/ safety;Sit to/from stand Lower Body Bathing Details (indicate cue type and reason): Supervision for safety; no physical assist needed Upper Body Dressing : Modified independent;Sitting   Lower Body Dressing: Modified independent;Sit to/from stand   Toilet Transfer: Supervision/safety;Ambulation;Regular Teacher, adult education Details (indicate cue type and reason): Supervision for safety; no physical assist needed Toileting- Clothing Manipulation and Hygiene: Modified independent;Sit to/from stand       Functional mobility during ADLs: Supervision/safety (without an AD; Supervision for safety; no physical assist needed)       Vision Baseline Vision/History: 1 Wears glasses (readers) Ability to See in Adequate Light: 0 Adequate Patient Visual Report: No change from baseline  Perception         Praxis         Pertinent Vitals/Pain Pain Assessment Pain Assessment:  Faces Faces Pain Scale: Hurts a little bit Pain Location: R abdomen Pain Descriptors / Indicators: Aching, Discomfort Pain Intervention(s): Limited activity within patient's tolerance, Monitored during session, Repositioned     Extremity/Trunk Assessment Upper Extremity Assessment Upper Extremity Assessment: Right hand dominant;Overall [facility] for tasks assessed   Lower Extremity Assessment Lower Extremity Assessment: Defer to PT evaluation   Cervical / Trunk Assessment Cervical / Trunk Assessment: Normal   Communication Communication Communication: No apparent difficulties   Cognition Arousal: Alert Behavior During Therapy: WFL for tasks assessed/performed Cognition: No apparent impairments             OT - Cognition Comments: Pt AAOx4 and pleasant throughout session. Pt with cognition WFL for tasks assessed. Cogntiion not formally screened or evaluated.                 Following commands: Intact       Cueing  General Comments   Cueing Techniques: Verbal cues  Niece present and supportive during session   Exercises     Shoulder Instructions      Home Living Family/patient expects to be discharged to:: Private residence Living Arrangements: Children;Other relatives Available Help at Discharge: Family;Available 24 hours/day Type of Home: House Home Access: Level entry     Home Layout: Two level;Full bath on main level;Able to live on main level with bedroom/bathroom Alternate Level Stairs-Number of Steps:  (flight)   Bathroom Shower/Tub: Producer, television/film/video: Handicapped height     Home Equipment: Grab bars - tub/shower;Grab bars - toilet;Shower seat;BSC/3in1;Rolling Environmental consultant (2 wheels);Wheelchair - manual;Cane - single point          Prior Functioning/Environment Prior Level of Function : Independent/Modified Independent             Mobility Comments: ModI with no AD or SP cane on days with pain 2/2 arthritis ADLs Comments:  Family assists with driving; Independent with ADLs and all other IADLs; pt enjoys doing word search puzzles    OT Problem List:     OT Treatment/Interventions:        OT Goals(Current goals can be found in the care plan section)   Acute Rehab OT Goals Patient Stated Goal: to return home and stay active and independent OT Goal Formulation: With patient   OT Frequency:       Co-evaluation PT/OT/SLP Co-Evaluation/Treatment: Yes Reason for Co-Treatment: To address functional/ADL transfers   OT goals addressed during session: ADL's and self-care      AM-PAC OT "6 Clicks" Daily Activity     Outcome Measure Help from another person eating meals?: None Help from another person taking care of personal grooming?: None Help from another person toileting, which includes using toliet, bedpan, or urinal?: A Little Help from another person bathing (including washing, rinsing, drying)?: A Little Help from another person to put on and taking off regular upper body clothing?: None Help from another person to put on and taking off regular lower body clothing?: None 6 Click Score: 22   End of Session Equipment Utilized During Treatment: Gait belt Nurse Communication: Mobility status;Other (comment) (OT signing off)  Activity Tolerance: Patient tolerated treatment well Patient left: in bed;with call bell/phone within reach;with family/visitor present  OT Visit Diagnosis: Other (comment) (decreased activity tolerance)  Time: 5621-3086 OT Time Calculation (min): 22 min Charges:  OT General Charges $OT Visit: 1 Visit OT Evaluation $OT Eval Low Complexity: 1 Low  Asencion Loveday "Kyle" M., OTR/L, MA Acute Rehab [phone]   Walt Gunner 04/02/2024, 4:50 PM

## 2024-04-02 NOTE — Evaluation (Signed)
 Physical Therapy Evaluation Patient Details Name: Sophia [name] MRN: [id number] DOB: 14-Feb-1951 Today's Date: 04/02/2024  History of Present Illness  73 y.o. female presents to [facility] 03/31/24 for R VATS and RLL wedge resection with lymph node sampling. PMHx: tobacco use, hypertension, hyperlipidemia, type 2 diabetes, obesity, sleep apnea, COVID-19, coronary calcification, aortic atherosclerosis, reflux, arthritis, anxiety, and depression.   Clinical Impression  Pt in bed upon arrival and agreeable to PT eval. PTA, pt was independent for mobility with either no AD or SP cane. In today's session, pt was ModI for bed mobility and to stand. Supervision to ambulate 320 ft with no AD due to pt slightly veering right/left. Pt reports being at functional mobility baseline. She will have 24/7 assist available upon return home. Pt has no acute or post-acute PT needs. Reports feeling comfortable discharging home with no further questions or concerns. Acute PT signing off. Please re-consult if new needs arise.      If plan is discharge home, recommend the following: A little help with walking and/or transfers;Assist for transportation;Help with stairs or ramp for entrance   Can travel by private vehicle    Yes    Equipment Recommendations None recommended by PT     Functional Status Assessment Patient has had a recent decline in their functional status and demonstrates the ability to make significant improvements in function in a reasonable and predictable amount of time.     Precautions / Restrictions Precautions Precautions: Fall Restrictions Weight Bearing Restrictions Per Provider Order: No      Mobility  Bed Mobility Overal bed mobility: Modified Independent   Transfers Overall transfer level: Modified independent Equipment used: None   Ambulation/Gait Ambulation/Gait assistance: Supervision Gait Distance (Feet): 320 Feet Assistive device: None Gait Pattern/deviations: Step-through  pattern, Decreased stride length, Drifts right/left Gait velocity: decr    General Gait Details: drifts right/left with no AD, likely baseline. supervision for safety.     Balance Overall balance assessment: Needs assistance Sitting-balance support: No upper extremity supported, Feet supported Sitting balance-Leahy Scale: Normal     Standing balance support: No upper extremity supported Standing balance-Leahy Scale: Good        Pertinent Vitals/Pain Pain Assessment Pain Assessment: Faces Faces Pain Scale: Hurts a little bit Pain Location: R abdomen Pain Descriptors / Indicators: Aching, Discomfort Pain Intervention(s): Limited activity within patient's tolerance, Monitored during session, Repositioned    Home Living Family/patient expects to be discharged to:: Private residence Living Arrangements: Children;Other relatives Available Help at Discharge: Family;Available 24 hours/day Type of Home: House Home Access: Level entry     Home Layout: Two level;Full bath on main level;Able to live on main level with bedroom/bathroom Home Equipment: Grab bars - tub/shower;Grab bars - toilet;Shower seat;BSC/3in1;Rolling Environmental consultant (2 wheels);Wheelchair - manual;Cane - single point      Prior Function Prior Level of Function : Independent/Modified Independent        Mobility Comments: ModI with no AD or SP cane on days with pain 2/2 arthritis ADLs Comments: Family assists with driving, independent with ADLs and iADLs     Extremity/Trunk Assessment   Upper Extremity Assessment Upper Extremity Assessment: Defer to OT evaluation    Lower Extremity Assessment Lower Extremity Assessment: Overall WFL for tasks assessed    Cervical / Trunk Assessment Cervical / Trunk Assessment: Normal  Communication   Communication Communication: No apparent difficulties    Cognition Arousal: Alert Behavior During Therapy: WFL for tasks assessed/performed   PT - Cognitive impairments: No  apparent impairments  Following commands: Intact       Cueing Cueing Techniques: Verbal cues     General Comments General comments (skin integrity, edema, etc.): Niece present and supportive through session     PT Assessment Patient does not need any further PT services                Co-evaluation PT/OT/SLP Co-Evaluation/Treatment: Yes Reason for Co-Treatment: To address functional/ADL transfers PT goals addressed during session: Mobility/safety with mobility;Balance         AM-PAC PT "6 Clicks" Mobility  Outcome Measure Help needed turning from your back to your side while in a flat bed without using bedrails?: None Help needed moving from lying on your back to sitting on the side of a flat bed without using bedrails?: None Help needed moving to and from a bed to a chair (including a wheelchair)?: A Little Help needed standing up from a chair using your arms (e.g., wheelchair or bedside chair)?: None Help needed to walk in hospital room?: A Little Help needed climbing 3-5 steps with a railing? : A Little 6 Click Score: 21    End of Session Equipment Utilized During Treatment: Gait belt Activity Tolerance: Patient tolerated treatment well Patient left: in bed;with call bell/phone within reach;with family/visitor present Nurse Communication: Mobility status PT Visit Diagnosis: Unsteadiness on feet (R26.81)    Time: 9147-8295 PT Time Calculation (min) (ACUTE ONLY): 22 min   Charges:   PT Evaluation $PT Eval Low Complexity: 1 Low   PT General Charges $$ ACUTE PT VISIT: 1 Visit        Orysia Blas, PT, DPT Secure Chat Preferred  Rehab Office [phone]   Alissa April Adela Ades 04/02/2024, 9:43 AM

## 2024-04-03 ENCOUNTER — Inpatient Hospital Stay (HOSPITAL_COMMUNITY)

## 2024-04-03 MED ORDER — OXYCODONE HCL 5 MG PO TABS: 5.0000 mg | ORAL_TABLET | Freq: Four times a day (QID) | ORAL | 0 refills | Status: DC | PRN

## 2024-04-03 MED ORDER — AMLODIPINE BESYLATE 10 MG PO TABS: 10.0000 mg | ORAL_TABLET | Freq: Every day | ORAL | Status: DC

## 2024-04-03 MED ORDER — GABAPENTIN 300 MG PO CAPS: 300.0000 mg | ORAL_CAPSULE | Freq: Every day | ORAL | 0 refills | Status: DC

## 2024-04-03 MED ORDER — CHLORHEXIDINE GLUCONATE CLOTH 2 % EX PADS
6.0000 | MEDICATED_PAD | Freq: Every day | CUTANEOUS | Status: DC
Start: 2024-04-03 — End: 2024-04-03
  Administered 2024-04-03: 6 via TOPICAL

## 2024-04-03 NOTE — Progress Notes (Signed)
      [address]       [place] [address]             [phone]       3 Days Post-Op Procedure(s) (LRB): WEDGE RESECTION, LUNG, ROBOT-ASSISTED, THORACOSCOPIC LYMPH NODE BIOPSY (Right) BLOCK, NERVE, INTERCOSTAL (Right)  Subjective: Patient has already walked this am. She wants to go home.  Objective: Vital signs in last 24 hours: Temp:  [98 F (36.7 C)-98.8 F (37.1 C)] 98.3 F (36.8 C) (05/17 0741) Pulse Rate:  [60-80] 78 (05/17 0741) Cardiac Rhythm: Normal sinus rhythm (05/17 0700) Resp:  [13-21] 13 (05/17 0741) BP: (93-115)/(49-90) 106/54 (05/17 0741) SpO2:  [90 %-98 %] 93 % (05/17 0741)     Intake/Output from previous day: 05/16 0701 - 05/17 0700 In: 1200 [P.O.:1200] Out: 525 [Urine:525]   Physical Exam:  Cardiovascular: RRR Pulmonary: Clear to auscultation bilaterally Abdomen: Soft, non tender, bowel sounds present. Extremities: No lower extremity edema. Wounds: Clean and dry.  No erythema or signs of infection. Chest tube wound with mild sero sanguinous drainage. Dry 2x2 and tape applied.  Lab Results: CBC: Recent Labs    04/01/24 0230 04/02/24 0300  WBC 8.1 9.1  HGB 9.9* 9.9*  HCT 31.4* 31.1*  PLT 177 186   BMET:  Recent Labs    04/01/24 0230 04/02/24 0300  NA 139 139  K 4.4 3.6  CL 101 101  CO2 25 30  GLUCOSE 122* 111*  BUN 18 29*  CREATININE 1.09* 1.13*  CALCIUM  8.8* 9.2    PT/INR: No results for input(s): "LABPROT", "INR" in the last 72 hours. ABG:  INR: Will add last result for INR, ABG once components are confirmed Will add last 4 CBG results once components are confirmed  Assessment/Plan:  1. CV - SR. On Amlodipine  10 mg daily, Benazepril  40 mg daily, Hydrochlorothiazide  25 mg daily, and Chlorthalidone  prior to surgery. Will gradually restart at discharge. 2.  Pulmonary - On room air. Chest tube removed yesterday. PA/LAT CXR this am shows no pneumothorax, minor right lateral chest wall subcutaneous  emphysema, and small left pleural effusion Encourage incentive spirometer. Final pathology: TNM Code: pT2a, pN0 (Dr. Luna Salinas will discuss at office visit). 3. Anemia-Last H and H stable at 9.9 and 31.1. Continue oral iron  4. On Lovenox  for DVT prophylaxis 5. Discharge  Afia Messenger M ZimmermanPA-C 04/03/2024,8:36 AM

## 2024-04-05 ENCOUNTER — Telehealth: Payer: Self-pay

## 2024-04-05 NOTE — Transitions of Care (Post Inpatient/ED Visit) (Signed)
 04/05/2024  Name: Sophia [name] MRN: [id number] DOB: 1950-12-23  Today's TOC FU Call Status: Today's TOC FU Call Status:: Successful TOC FU Call Completed TOC FU Call Complete Date: 04/05/24 Patient's Name and Date of Birth confirmed.  Transition Care Management Follow-up Telephone Call Date of Discharge: 04/03/24 Discharge Facility: [facility]) Type of Discharge: Inpatient Admission Primary Inpatient Discharge Diagnosis:: Right lower lobe lung nodule How have you been since you were released from the hospital?: Better (Reports that she feels sore, continues to have some shortness of breath) Any questions or concerns?: No  Items Reviewed: Did you receive and understand the discharge instructions provided?: Yes Medications obtained,verified, and reconciled?: Yes (Medications Reviewed) Any new allergies since your discharge?: No Dietary orders reviewed?: Yes Type of Diet Ordered:: low salt, heart healthy diet Do you have support at home?: Yes People in Home [RPT]: child(ren), adult Name of Support/Comfort Primary Source: sons and neice,   Genevia Kern  ( neice)  Medications Reviewed Today: Medications Reviewed Today     Reviewed by Vanetta Generous, RN (Registered Nurse) on 04/05/24 at 1053  Med List Status: <None>   Medication Order Taking? Sig Documenting Provider Last Dose Status Informant  acetaminophen  (TYLENOL ) 650 MG CR tablet [id number] Yes Take 650 mg by mouth 2 (two) times daily. [provider] Taking Active Self  albuterol  (VENTOLIN  HFA) 108 (90 Base) MCG/ACT inhaler [id number] No Inhale 2 puffs into the lungs every 6 (six) hours as needed for wheezing or shortness of breath.  Patient not taking: Reported on 04/05/2024   Towanda Fret, MD Not Taking Active Self  amLODipine  (NORVASC ) 10 MG tablet [id number] Yes Take 1 tablet (10 mg total) by mouth daily. Allegra Arch, PA-C Taking Active   ASPIRIN  EC PO [id number] Yes Take 325 mg by mouth daily. Swallow  whole. [provider] Taking Active Self  benazepril -hydrochlorthiazide (LOTENSIN  HCT) 20-12.5 MG tablet [id number] No TAKE 2 TABLETS EVERY DAY  Patient not taking: Reported on 04/05/2024   Towanda Fret, MD Not Taking Active Self  busPIRone  (BUSPAR ) 30 MG tablet [id number] Yes Take one tablet by mouth two times daily Towanda Fret, MD Taking Active Self  chlorthalidone  (HYGROTON ) 25 MG tablet [id number] Yes TAKE 1 TABLET EVERY DAY Towanda Fret, MD Taking Active Self  Cyanocobalamin  (VITAMIN B-12 PO) [id number] Yes 1 tablet. Take 2 gummies daily. [provider] Taking Active Self  ferrous gluconate  (FERGON) 324 MG tablet [id number] Yes Take 324 mg by mouth daily with breakfast. [provider] Taking Active Self  FLUoxetine  (PROZAC ) 40 MG capsule [id number] Yes Take 1 capsule (40 mg total) by mouth daily. Towanda Fret, MD Taking Active Self  gabapentin  (NEURONTIN ) 300 MG capsule [id number] Yes Take 1 capsule (300 mg total) by mouth at bedtime. Allegra Arch, PA-C Taking Active   Melatonin 1 MG CHEW [id number] Yes Chew 2 mg by mouth at bedtime. Towanda Fret, MD Taking Active Self  Multiple Vitamin (MULTIVITAMIN) tablet [id number] No Take 1 tablet by mouth daily.  Patient not taking: Reported on 04/05/2024   [provider] Not Taking Active Self  omeprazole  (PRILOSEC) 40 MG capsule [id number] No TAKE 1 CAPSULE TWICE DAILY FOR 2 MONTH ONLY  Patient not taking: Reported on 04/05/2024   Vinetta Greening, DO Not Taking Active Self           Med Note (ROSE, Minna Amass Apr 05, 2024 10:52 AM) Waiting to come through mail order.  Completely  out  oxyCODONE  (OXY IR/ROXICODONE ) 5 MG immediate release tablet [id number]  Take 1 tablet (5 mg total) by mouth every 6 (six) hours as needed for moderate pain (pain score 4-6). Zimmerman, Donielle M, PA-C  Active   polyethylene glycol (MIRALAX / GLYCOLAX) 17 g packet [id number] Yes Take 17 g by  mouth daily as needed for moderate constipation. [provider] Taking Active Self  rosuvastatin  (CRESTOR ) 20 MG tablet [id number] Yes TAKE 1 TABLET EVERY DAY Towanda Fret, MD Taking Active Self  sucralfate  (CARAFATE ) 1 g tablet [id number] No TAKE 1 TABLET TWICE DAILY  Patient not taking: Reported on 04/05/2024   Towanda Fret, MD Not Taking Active Self           Med Note (ROSE, Minna Amass Apr 05, 2024 10:53 AM) Waiting to come in the mail  traZODone  (DESYREL ) 50 MG tablet [id number]  Take 0.5-1 tablets (25-50 mg total) by mouth at bedtime as needed for sleep. Towanda Fret, MD  Active             Home Care and Equipment/Supplies: Were Home Health Services Ordered?: No Any new equipment or medical supplies ordered?: No  Functional Questionnaire: Do you need assistance with bathing/showering or dressing?: No Do you need assistance with meal preparation?: No Do you need assistance with eating?: No Do you have difficulty maintaining continence: No Do you need assistance with getting out of bed/getting out of a chair/moving?: No Do you have difficulty managing or taking your medications?: Yes (niece has meds in a pill planner.)  Follow up appointments reviewed: PCP Follow-up appointment confirmed?: Yes Date of PCP follow-up appointment?: 04/30/24 Follow-up Provider: Dr. Rodolph Clap Specialist [facility] Follow-up appointment confirmed?: Yes Date of Specialist follow-up appointment?: 04/14/24 Follow-Up Specialty Provider:: Surgeon Do you need transportation to your follow-up appointment?: No Do you understand care options if your condition(s) worsen?: Yes-patient verbalized understanding  SDOH Interventions Today    Flowsheet Row Most Recent Value  SDOH Interventions   Food Insecurity Interventions Intervention Not Indicated  Housing Interventions Intervention Not Indicated  Transportation Interventions Intervention Not Indicated  Utilities Interventions  Intervention Not Indicated      Patient reports that she is doing well. Reports that she continues to be sore.  States otherwise doing well.  Reports family is assisting as needed.  Encouraged patient and family to schedule a hospital follow up appointment with PCP. Both agreed.    Reviewed importance of taking all medications. Reviewed when for patient to call MD for changes in condition.  Reviewed and offered 30 day TOC program and patient declined. Provided my contact information.  Orpha Blade, RN, BSN, CEN [employer]- Transition of Care Team.  [facility] [phone]

## 2024-04-07 ENCOUNTER — Other Ambulatory Visit: Payer: Self-pay | Admitting: Thoracic Surgery (Cardiothoracic Vascular Surgery)

## 2024-04-07 ENCOUNTER — Encounter

## 2024-04-07 DIAGNOSIS — R911 Solitary pulmonary nodule: Secondary | ICD-10-CM

## 2024-04-08 ENCOUNTER — Other Ambulatory Visit: Payer: Self-pay | Admitting: *Deleted

## 2024-04-08 NOTE — Progress Notes (Signed)
 The proposed treatment discussed in conference is for discussion purpose only and is not a binding recommendation.  The patients have not been physically examined, or presented with their treatment options.  Therefore, final treatment plans cannot be decided.

## 2024-04-09 DIAGNOSIS — Z79899 Other long term (current) drug therapy: Secondary | ICD-10-CM | POA: Diagnosis not present

## 2024-04-09 DIAGNOSIS — I5032 Chronic diastolic (congestive) heart failure: Secondary | ICD-10-CM | POA: Diagnosis not present

## 2024-04-10 LAB — BASIC METABOLIC PANEL WITH GFR
BUN/Creatinine Ratio: 25 (ref 12–28)
BUN: 26 mg/dL (ref 8–27)
CO2: 26 mmol/L (ref 20–29)
Calcium: 10.2 mg/dL (ref 8.7–10.3)
Chloride: 100 mmol/L (ref 96–106)
Creatinine, Ser: 1.04 mg/dL — ABNORMAL HIGH (ref 0.57–1.00)
Glucose: 88 mg/dL (ref 70–99)
Potassium: 4.5 mmol/L (ref 3.5–5.2)
Sodium: 141 mmol/L (ref 134–144)
eGFR: 57 mL/min/{1.73_m2} — ABNORMAL LOW (ref >59)

## 2024-04-13 ENCOUNTER — Ambulatory Visit (HOSPITAL_COMMUNITY)
Admission: RE | Admit: 2024-04-13 | Discharge: 2024-04-13 | Disposition: A | Discharge status: Home / Self Care | Source: Ambulatory Visit | Attending: Cardiology | Admitting: Cardiology

## 2024-04-13 ENCOUNTER — Other Ambulatory Visit: Payer: Self-pay | Admitting: Thoracic Surgery (Cardiothoracic Vascular Surgery)

## 2024-04-13 ENCOUNTER — Encounter: Payer: Self-pay | Admitting: Thoracic Surgery (Cardiothoracic Vascular Surgery)

## 2024-04-13 ENCOUNTER — Ambulatory Visit
Payer: Self-pay | Attending: Thoracic Surgery (Cardiothoracic Vascular Surgery) | Admitting: Thoracic Surgery (Cardiothoracic Vascular Surgery)

## 2024-04-13 VITALS — BP 116/68 | HR 60 | Resp 20 | Ht 69.0 in | Wt 195.0 lb

## 2024-04-13 DIAGNOSIS — J984 Other disorders of lung: Secondary | ICD-10-CM | POA: Diagnosis not present

## 2024-04-13 DIAGNOSIS — J90 Pleural effusion, not elsewhere classified: Secondary | ICD-10-CM | POA: Diagnosis not present

## 2024-04-13 DIAGNOSIS — R911 Solitary pulmonary nodule: Secondary | ICD-10-CM | POA: Diagnosis not present

## 2024-04-13 DIAGNOSIS — Z9889 Other specified postprocedural states: Secondary | ICD-10-CM | POA: Diagnosis not present

## 2024-04-13 DIAGNOSIS — Z09 Encounter for follow-up examination after completed treatment for conditions other than malignant neoplasm: Secondary | ICD-10-CM

## 2024-04-13 DIAGNOSIS — R918 Other nonspecific abnormal finding of lung field: Secondary | ICD-10-CM | POA: Diagnosis not present

## 2024-04-13 MED ORDER — GABAPENTIN 300 MG PO CAPS: 300.0000 mg | ORAL_CAPSULE | Freq: Two times a day (BID) | ORAL | 3 refills | Status: DC

## 2024-04-13 MED ORDER — PREDNISONE 10 MG (21) PO TBPK: ORAL_TABLET | ORAL | 0 refills | Status: AC

## 2024-04-13 NOTE — Progress Notes (Signed)
 [address]       [place] [address]             [phone]     HPI: Mrs. [name] returns for follow-up after recent wedge resection  Sophia [name] is a 73 year old woman with a history of hypertension, hyperlipidemia, type 2 diabetes, obesity, sleep apnea, COVID-19, coronary calcification, aortic atherosclerosis, reflux, arthritis, anxiety, depression, remote minimal tobacco use, and newly diagnosed stage Ib adenocarcinoma of the lung.  She smoked about 1/2 pack of cigarettes a day for 20 years before quitting in 1991 (10-pack-year)  She had a CT of the abdomen back in 2023 and was noted to have a nodule at the base of the right lower lobe.  Recently had a follow-up scan which showed the nodule decreased in size from 6 mm to 10 mm.  PET showed mild hypermetabolism with an SUV of 2.3.  There was no evidence of regional or distant metastatic disease.  She underwent a robotic assisted right lower lobe wedge resection and lymph node dissection on 03/31/2024.  Nodule turned out to be a t2a, N0, stage Ib adenocarcinoma.  It was T2a because of visceral pleural involvement.  Her postoperative course was unremarkable and she went home on day 3.  She is having some soreness.  Typically along the right submammary fold.  She is taking Tylenol  on a regular basis.  Taking gabapentin  once a day.  Using oxycodone  as needed.  Her daughter says she is using it about once a day as well.  Past Medical History:  Diagnosis Date   Acute respiratory disease due to COVID-19 virus 08/28/2019   Anxiety    Arthritis    Depression    Diabetes mellitus type 2 in obese 11/28/2010   Qualifier: Diagnosis of  By: Rodolph Clap MD, Margaret  Diet controlled in 12/2016    Diabetes mellitus without complication (HCC)    GERD (gastroesophageal reflux disease)    Hypercholesteremia    Hypertension    PONV (postoperative nausea and vomiting)    Sleep apnea     Current Outpatient Medications  Medication  Sig Dispense Refill   acetaminophen  (TYLENOL ) 650 MG CR tablet Take 650 mg by mouth 2 (two) times daily.     albuterol  (VENTOLIN  HFA) 108 (90 Base) MCG/ACT inhaler Inhale 2 puffs into the lungs every 6 (six) hours as needed for wheezing or shortness of breath. 8 g 0   amLODipine  (NORVASC ) 10 MG tablet Take 1 tablet (10 mg total) by mouth daily.     ASPIRIN  EC PO Take 325 mg by mouth daily. Swallow whole.     benazepril -hydrochlorthiazide (LOTENSIN  HCT) 20-12.5 MG tablet TAKE 2 TABLETS EVERY DAY 180 tablet 3   busPIRone  (BUSPAR ) 30 MG tablet Take one tablet by mouth two times daily 60 tablet 3   chlorthalidone  (HYGROTON ) 25 MG tablet TAKE 1 TABLET EVERY DAY 90 tablet 3   Cyanocobalamin  (VITAMIN B-12 PO) 1 tablet. Take 2 gummies daily.     ferrous gluconate  (FERGON) 324 MG tablet Take 324 mg by mouth daily with breakfast.     FLUoxetine  (PROZAC ) 40 MG capsule Take 1 capsule (40 mg total) by mouth daily. 90 capsule 3   Melatonin 1 MG CHEW Chew 2 mg by mouth at bedtime.     Multiple Vitamin (MULTIVITAMIN) tablet Take 1 tablet by mouth daily.     omeprazole  (PRILOSEC) 40 MG capsule TAKE 1 CAPSULE TWICE DAILY FOR 2 MONTH ONLY 180 capsule 3  oxyCODONE  (OXY IR/ROXICODONE ) 5 MG immediate release tablet Take 1 tablet (5 mg total) by mouth every 6 (six) hours as needed for moderate pain (pain score 4-6). 30 tablet 0   polyethylene glycol (MIRALAX / GLYCOLAX) 17 g packet Take 17 g by mouth daily as needed for moderate constipation.     predniSONE  (STERAPRED UNI-PAK 21 TAB) 10 MG (21) TBPK tablet Take 6 tablets (60 mg total) by mouth daily for 1 day, THEN 5 tablets (50 mg total) daily for 1 day, THEN 4 tablets (40 mg total) daily for 1 day, THEN 3 tablets (30 mg total) daily for 1 day, THEN 2 tablets (20 mg total) daily for 1 day, THEN 1 tablet (10 mg total) daily for 1 day. 21 tablet 0   rosuvastatin  (CRESTOR ) 20 MG tablet TAKE 1 TABLET EVERY DAY 90 tablet 3   sucralfate  (CARAFATE ) 1 g tablet TAKE 1 TABLET  TWICE DAILY 180 tablet 3   gabapentin  (NEURONTIN ) 300 MG capsule Take 1 capsule (300 mg total) by mouth 2 (two) times daily. 60 capsule 3   traZODone  (DESYREL ) 50 MG tablet Take 0.5-1 tablets (25-50 mg total) by mouth at bedtime as needed for sleep. 30 tablet 1   No current facility-administered medications for this visit.    Physical Exam BP 116/68   Pulse 60   Resp 20   Ht 5\' 9"  (1.753 m)   Wt 195 lb (88.5 kg)   SpO2 96% Comment: RA  BMI 28.80 kg/m  Obese 73 year old woman in no acute distress Alert and oriented x 3 with no focal deficits Lungs clear Cardiac regular rate and rhythm Incisions clean, dry and intact No peripheral edema  Diagnostic Tests: I personally reviewed her chest x-ray images.  There is a small right pleural effusion that was not present prior to discharge.  Impression: Sophia [name] is a 73 year old woman with a history of hypertension, hyperlipidemia, type 2 diabetes, obesity, sleep apnea, COVID-19, coronary calcification, aortic atherosclerosis, reflux, arthritis, anxiety, depression, remote minimal tobacco use, and newly diagnosed stage Ib adenocarcinoma of the lung.  Stage Ib adenocarcinoma of the lung (T2a, N0)-small peripheral nodule.  Status post wedge resection.  All nodes sampled were negative.  No indication for adjuvant chemotherapy.  To give consideration to targeted therapy if she has a targetable mutation.  Will refer to Dr. Katragadda at [facility] since she lives in [place].  Status post wedge resection-doing well.  Does have some pain.  Mostly controlled with Tylenol .  Is only using oxycodone  about once a day.  I recommended that we try to go up on the gabapentin  to twice daily to see if that will help.  She does have a small effusion.  There are some fluid tracking up in the fissure.  Will give her a prednisone  taper to see if that helps clear of the effusion.  Plan: [facility] new stage Ib adenocarcinoma of the lung. Prednisone  taper Increase gabapentin  to 300 mg twice daily Return in 3 weeks with PA and lateral chest x-ray  Zelphia Higashi, MD [facility] [phone]

## 2024-04-14 ENCOUNTER — Ambulatory Visit: Admitting: Thoracic Surgery (Cardiothoracic Vascular Surgery)

## 2024-04-14 ENCOUNTER — Ambulatory Visit (HOSPITAL_COMMUNITY)
Admission: RE | Admit: 2024-04-14 | Source: Ambulatory Visit | Attending: Thoracic Surgery (Cardiothoracic Vascular Surgery) | Admitting: Thoracic Surgery (Cardiothoracic Vascular Surgery)

## 2024-04-22 ENCOUNTER — Other Ambulatory Visit: Payer: Self-pay | Admitting: Family Medicine

## 2024-04-30 ENCOUNTER — Ambulatory Visit: Payer: Self-pay | Admitting: Professional Counselor

## 2024-04-30 ENCOUNTER — Encounter: Payer: Self-pay | Admitting: Family Medicine

## 2024-04-30 ENCOUNTER — Ambulatory Visit (INDEPENDENT_AMBULATORY_CARE_PROVIDER_SITE_OTHER): Admitting: Family Medicine

## 2024-04-30 VITALS — BP 121/65 | HR 73 | Resp 16 | Ht 69.0 in | Wt 194.1 lb

## 2024-04-30 DIAGNOSIS — F324 Major depressive disorder, single episode, in partial remission: Secondary | ICD-10-CM | POA: Diagnosis not present

## 2024-04-30 DIAGNOSIS — I10 Essential (primary) hypertension: Secondary | ICD-10-CM

## 2024-04-30 DIAGNOSIS — N1831 Chronic kidney disease, stage 3a: Secondary | ICD-10-CM

## 2024-04-30 DIAGNOSIS — E1169 Type 2 diabetes mellitus with other specified complication: Secondary | ICD-10-CM | POA: Diagnosis not present

## 2024-04-30 DIAGNOSIS — F411 Generalized anxiety disorder: Secondary | ICD-10-CM

## 2024-04-30 DIAGNOSIS — E785 Hyperlipidemia, unspecified: Secondary | ICD-10-CM

## 2024-04-30 DIAGNOSIS — F4310 Post-traumatic stress disorder, unspecified: Secondary | ICD-10-CM | POA: Diagnosis not present

## 2024-04-30 DIAGNOSIS — F4321 Adjustment disorder with depressed mood: Secondary | ICD-10-CM

## 2024-04-30 NOTE — BH Specialist Note (Unsigned)
  Virtual BH Telephone Follow-up  MRN: [id number] NAME: Sophia [name] Date: 04/30/24  Start time: Start Time: 0245 End time: Stop Time: 0315 Total time: Total Time in Minutes (Visit): 30 Call number: Visit Number: 4- Fourth Visit  Reason for call today:  Patient is a 73 year old female who presented for a collaborative care follow-up. It has been a couple of months since her last visit. Since then, she has experienced a significant increase in depressive symptoms following the unexpected loss of her husband approximately one month ago. While she was aware of his health issues, the severity was not fully known to her, and his passing felt abrupt, leaving her emotionally overwhelmed and struggling to cope.  The patient reports feeling supported by both her family and church community, noting that she does have people she can reach out to. However, despite this network, she continues to experience a sense of loneliness and isolation. She also shared that she is dealing with her own health challenges, having recently undergone surgery to remove a cancerous spot on her lung. She reports that her recovery has gone relatively well so far.  She feels that her current medication regimen is effective, and she is not requesting any changes at this time. Her primary need is for supportive therapy to help her process her grief and manage her depressive symptoms during this period of adjustment.  Brief therapeutic interventions will be provided through collaborative care services, and a follow-up appointment is scheduled in two weeks to continue monitoring her symptoms and provide additional support as needed.  PHQ-9 Scores:     04/05/2024   11:00 AM 02/27/2024   10:08 AM 02/13/2024   10:07 AM 10/22/2023    9:57 AM 09/23/2023    2:46 PM  Depression screen PHQ 2/9  Decreased Interest 2 1 1  0 0  Down, Depressed, Hopeless 2 1 1  0 0  PHQ - 2 Score 4 2 2  0 0  Altered sleeping 0 2 0    Tired, decreased  energy 2 1 1     Change in appetite 0 0 0    Feeling bad or failure about yourself  0 0 1    Trouble concentrating 0 1 1    Moving slowly or fidgety/restless 0 0 0    Suicidal thoughts 0 0 0    PHQ-9 Score 6 6 5     Difficult doing work/chores Not difficult at all Somewhat difficult Somewhat difficult     GAD-7 Scores:     02/27/2024   10:10 AM 02/13/2024   10:18 AM 01/30/2024    1:38 PM 02/25/2023    3:59 PM  GAD 7 : Generalized Anxiety Score  Nervous, Anxious, on Edge 1 1 3  0  Control/stop worrying 1 1 3 1   Worry too much - different things 0 1 3 1   Trouble relaxing 0 0 3 1  Restless 0 1 2 1   Easily annoyed or irritable 0 1 1 1   Afraid - awful might happen 0 1 3 1   Total GAD 7 Score 2 6 18 6   Anxiety Difficulty Somewhat difficult Somewhat difficult Extremely difficult Somewhat difficult    Stress Current stressors:  Medical problems Sleep:  Fair Appetite:  fair Coping ability:  fair Patient taking medications as prescribed:  Yes  Current medications:  Outpatient Encounter Medications as of 04/30/2024  Medication Sig   acetaminophen  (TYLENOL ) 650 MG CR tablet Take 650 mg by mouth 2 (two) times daily.   albuterol  (VENTOLIN  HFA) 108 (90  Base) MCG/ACT inhaler Inhale 2 puffs into the lungs every 6 (six) hours as needed for wheezing or shortness of breath.   amLODipine  (NORVASC ) 10 MG tablet TAKE ONE TABLET BY MOUTH ONCE DAILY.   ASPIRIN  EC PO Take 325 mg by mouth daily. Swallow whole.   benazepril -hydrochlorthiazide (LOTENSIN  HCT) 20-12.5 MG tablet TAKE 2 TABLETS EVERY DAY   busPIRone  (BUSPAR ) 30 MG tablet Take one tablet by mouth two times daily   chlorthalidone  (HYGROTON ) 25 MG tablet TAKE 1 TABLET EVERY DAY   Cyanocobalamin  (VITAMIN B-12 PO) 1 tablet. Take 2 gummies daily.   ferrous gluconate  (FERGON) 324 MG tablet Take 324 mg by mouth daily with breakfast.   FLUoxetine  (PROZAC ) 40 MG capsule Take 1 capsule (40 mg total) by mouth daily.   gabapentin  (NEURONTIN ) 300 MG capsule  Take 1 capsule (300 mg total) by mouth 2 (two) times daily.   Melatonin 1 MG CHEW Chew 2 mg by mouth at bedtime.   Multiple Vitamin (MULTIVITAMIN) tablet Take 1 tablet by mouth daily.   omeprazole  (PRILOSEC) 40 MG capsule TAKE 1 CAPSULE TWICE DAILY FOR 2 MONTH ONLY   oxyCODONE  (OXY IR/ROXICODONE ) 5 MG immediate release tablet Take 1 tablet (5 mg total) by mouth every 6 (six) hours as needed for moderate pain (pain score 4-6).   polyethylene glycol (MIRALAX / GLYCOLAX) 17 g packet Take 17 g by mouth daily as needed for moderate constipation.   rosuvastatin  (CRESTOR ) 20 MG tablet TAKE 1 TABLET EVERY DAY   sucralfate  (CARAFATE ) 1 g tablet TAKE 1 TABLET TWICE DAILY   traZODone  (DESYREL ) 50 MG tablet Take 0.5-1 tablets (25-50 mg total) by mouth at bedtime as needed for sleep.   No facility-administered encounter medications on file as of 04/30/2024.     Self-harm Behaviors Risk Assessment Self-harm risk factors:  Depression, passive thoughts of wanting to be with her deceased husband. Patient endorses recent thoughts of harming self:  Denies   Danger to Others Risk Assessment Danger to others risk factors:  None Patient endorses recent thoughts of harming others:  Denies   Substance Use Assessment Patient recently consumed alcohol:  None  Alcohol Use Disorder Identification Test (AUDIT):     07/25/2021    2:14 PM 08/13/2022    2:47 PM 10/22/2023    9:58 AM  Alcohol Use Disorder Test (AUDIT)  1. How often do you have a drink containing alcohol? 0 0 0  2. How many drinks containing alcohol do you have on a typical day when you are drinking? 0 0 0  3. How often do you have six or more drinks on one occasion? 0 0 0  AUDIT-C Score 0 0 0    Goals, Interventions and Follow-up Plan Goals: Increase healthy adjustment to current life circumstances Interventions: CBT Cognitive Behavioral Therapy and Medication Monitoring Follow-up Plan:     Regina Capes

## 2024-04-30 NOTE — Patient Instructions (Addendum)
 F/u in  12 weeks, call if you need me before   Urine ACR today  Fasting lipid, cmp and EGFR and HBa!C, 1 week before next appointment  Thanks for choosing [facility], we consider it a privelige to serve you.

## 2024-05-02 LAB — MICROALBUMIN / CREATININE URINE RATIO
Creatinine, Urine: 122.1 mg/dL
Microalb/Creat Ratio: 10 mg/g{creat} (ref 0–29)
Microalbumin, Urine: 11.9 ug/mL

## 2024-05-03 ENCOUNTER — Inpatient Hospital Stay

## 2024-05-03 ENCOUNTER — Inpatient Hospital Stay: Attending: Oncology | Admitting: Oncology

## 2024-05-03 ENCOUNTER — Ambulatory Visit: Payer: Self-pay | Admitting: Family Medicine

## 2024-05-03 VITALS — HR 64 | Temp 98.3°F | Resp 18 | Wt 196.0 lb

## 2024-05-03 DIAGNOSIS — C3431 Malignant neoplasm of lower lobe, right bronchus or lung: Secondary | ICD-10-CM | POA: Insufficient documentation

## 2024-05-03 DIAGNOSIS — F17200 Nicotine dependence, unspecified, uncomplicated: Secondary | ICD-10-CM | POA: Insufficient documentation

## 2024-05-03 DIAGNOSIS — D649 Anemia, unspecified: Secondary | ICD-10-CM | POA: Insufficient documentation

## 2024-05-03 DIAGNOSIS — C3491 Malignant neoplasm of unspecified part of right bronchus or lung: Secondary | ICD-10-CM

## 2024-05-03 DIAGNOSIS — N183 Chronic kidney disease, stage 3 unspecified: Secondary | ICD-10-CM | POA: Insufficient documentation

## 2024-05-03 LAB — CBC WITH DIFFERENTIAL/PLATELET
Abs Immature Granulocytes: 0.02 10*3/uL (ref 0.00–0.07)
Basophils Absolute: 0 10*3/uL (ref 0.0–0.1)
Basophils Relative: 1 %
Eosinophils Absolute: 0.2 10*3/uL (ref 0.0–0.5)
Eosinophils Relative: 3 %
HCT: 33.7 % — ABNORMAL LOW (ref 36.0–46.0)
Hemoglobin: 10.6 g/dL — ABNORMAL LOW (ref 12.0–15.0)
Immature Granulocytes: 0 %
Lymphocytes Relative: 28 %
Lymphs Abs: 1.6 10*3/uL (ref 0.7–4.0)
MCH: 26.6 pg (ref 26.0–34.0)
MCHC: 31.5 g/dL (ref 30.0–36.0)
MCV: 84.5 fL (ref 80.0–100.0)
Monocytes Absolute: 0.6 10*3/uL (ref 0.1–1.0)
Monocytes Relative: 11 %
Neutro Abs: 3.4 10*3/uL (ref 1.7–7.7)
Neutrophils Relative %: 57 %
Platelets: 215 10*3/uL (ref 150–400)
RBC: 3.99 MIL/uL (ref 3.87–5.11)
RDW: 16.6 % — ABNORMAL HIGH (ref 11.5–15.5)
WBC: 5.9 10*3/uL (ref 4.0–10.5)
nRBC: 0 % (ref 0.0–0.2)

## 2024-05-03 LAB — COMPREHENSIVE METABOLIC PANEL WITH GFR
ALT: 22 U/L (ref 0–44)
AST: 18 U/L (ref 15–41)
Albumin: 4 g/dL (ref 3.5–5.0)
Alkaline Phosphatase: 60 U/L (ref 38–126)
Anion gap: 10 (ref 5–15)
BUN: 29 mg/dL — ABNORMAL HIGH (ref 8–23)
CO2: 27 mmol/L (ref 22–32)
Calcium: 9.5 mg/dL (ref 8.9–10.3)
Chloride: 103 mmol/L (ref 98–111)
Creatinine, Ser: 0.93 mg/dL (ref 0.44–1.00)
GFR, Estimated: >60 mL/min (ref >60)
Glucose, Bld: 105 mg/dL — ABNORMAL HIGH (ref 70–99)
Potassium: 3.5 mmol/L (ref 3.5–5.1)
Sodium: 140 mmol/L (ref 135–145)
Total Bilirubin: 0.4 mg/dL (ref 0.0–1.2)
Total Protein: 7.4 g/dL (ref 6.5–8.1)

## 2024-05-03 LAB — FOLATE: Folate: 31.1 ng/mL (ref >5.9)

## 2024-05-03 LAB — IRON AND TIBC
Iron: 58 ug/dL (ref 28–170)
Saturation Ratios: 18 % (ref 10.4–31.8)
TIBC: 330 ug/dL (ref 250–450)
UIBC: 272 ug/dL

## 2024-05-03 LAB — VITAMIN B12: Vitamin B-12: 1454 pg/mL — ABNORMAL HIGH (ref 180–914)

## 2024-05-03 LAB — FERRITIN: Ferritin: 171 ng/mL (ref 11–307)

## 2024-05-03 MED ORDER — NICOTINE 7 MG/24HR TD PT24
7.0000 mg | MEDICATED_PATCH | Freq: Every day | TRANSDERMAL | 0 refills | Status: DC
Start: 2024-05-03 — End: 2024-06-13

## 2024-05-03 NOTE — Assessment & Plan Note (Addendum)
 Patient has normocytic anemia since 04/06/2024.  Likely nutritional deficiency/blood loss Last endoscopy 03/07/2021: Showed gastritis Last colonoscopy: 12/07/2020: No active bleeding Patient also has chronic kidney disease stage III  - Will obtain labs today including iron  panel, ferritin, vitamin B12, folate.  Will replace as needed. -Will also workup for multiple myeloma Return to clinic in 2 weeks to discuss results and further management

## 2024-05-03 NOTE — Patient Instructions (Signed)
 VISIT SUMMARY:  Today, you had a follow-up appointment after your lung cancer surgery. We discussed your recovery, ongoing symptoms, and other health concerns, including anemia and nicotine dependence.  YOUR PLAN:  -ANEMIA: Anemia is a condition where you don't have enough healthy red blood cells to carry adequate oxygen to your body's tissues, causing fatigue and shortness of breath. We will conduct blood tests to determine the cause of your anemia and test for multiple myeloma. If needed, we will consult a gastroenterologist. Please follow up in two weeks.  -LUNG CANCER: You had stage one lung cancer, which means the cancer was small and had not spread. Your surgery was successful, and no further therapy is needed at this time. We will monitor your condition with a CT scan in six months.  -NICOTINE DEPENDENCE: Nicotine dependence means you are addicted to nicotine, a substance found in tobacco. You are currently using snuff tobacco and want to quit. We have prescribed a low-dose nicotine patch to help you stop using tobacco. The prescription has been sent to [facility]  INSTRUCTIONS:  Please follow up in two weeks for anemia evaluation. Your next CT scan for lung cancer surveillance is scheduled in six months.

## 2024-05-03 NOTE — Progress Notes (Signed)
 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 Summary of oncologic history is as follows:  Oncology History  Non-small cell carcinoma of lung, right (HCC)  12/13/2023 Imaging   Chest CT without contrast:  IMPRESSION: 1. 10 x 8 mm subpleural right posterior lower lobe pulmonary nodule. Given interval change in appearance, recommend multi disciplinary thoracic consultation and potential PET CT follow-up. 2. Aortic atherosclerosis.   01/09/2024 PET scan   IMPRESSION: 1. 10 mm right lower lobe nodule does not show metabolism above blood pool but has enlarged from 08/11/2022. Low-grade adenocarcinoma cannot be definitively excluded. Recommend short-term follow-up CT chest without contrast in 3 months. 2. Small pericardial effusion, minimally improved. 3. Left adrenal adenoma.   03/31/2024 Pathology Results   FINAL MICROSCOPIC DIAGNOSIS:  A. LUNG, RIGHT LOWER LOBE, WEDGE RESECTION: Adenocarcinoma, acinar, 1.1 cm. Carcinoma involves visceral pleural connective tissue (pT2a). Surgical margins negative for carcinoma. Negative for lymphovascular involvement. Lymphovascular Invasion: Not identified Regional Lymph Nodes:Nodal Sites Examined: 4R, 7, 8, 10, 11: Negative Pathologic Stage Classification (pTNM, AJCC 8th Edition): pT2a, pN0    05/03/2024 Initial Diagnosis   Non-small cell carcinoma of lung, right (HCC)   05/03/2024 Cancer Staging   Staging form: Lung, AJCC V9 - Pathologic stage from 05/03/2024: Stage IB (pT2a, pN0, cM0) - Signed by Eduardo Grade, MD on 05/03/2024 Stage prefix: Initial diagnosis Method of lymph node assessment: Lymph node dissection       History of Presenting Illness: Sophia [name] 73 y.o. female is here for non-small cell lung cancer  of right lung.  She is accompanied by her son and niece today.  Patient reports feeling short of breath since the surgery.  She also reports fatigue that has been going on for a while now.Post-surgery, she experiences constant, mild pain in the surgical area, described as 'sore pain,' and some shortness of breath. No hemoptysis is present.She is currently taking iron  supplements and has a follow-up appointment with a nephrologist in July for chronic kidney disease.  Her lung nodule was initially found on a CT of abdomen in 2023 and then increased in size recently.  She was then referred to Dr. Luna Salinas for lobectomy.  She has a history of smoking, having quit in the late 1990s, and currently uses snuff tobacco twice a day. She is attempting to quit.  She does not drink alcohol.She recently lost her husband in April 2025, significantly impacting her emotional well-being and daily activities.  She lives with her son currently.  Medical History: Past Medical History:  Diagnosis Date   Acute respiratory disease due to COVID-19 virus 08/28/2019   Anxiety    Arthritis    Depression    Diabetes mellitus type 2 in obese 11/28/2010   Qualifier: Diagnosis of  By: Rodolph Clap MD, Margaret  Diet controlled in 12/2016    Diabetes mellitus without complication (HCC)    GERD (gastroesophageal reflux disease)    Hypercholesteremia    Hypertension    PONV (postoperative nausea and vomiting)    Sleep apnea     Surgical history: Past Surgical History:  Procedure Laterality Date   ABDOMINAL HYSTERECTOMY  11/18/1980   tubes and womb, bleeding and ectopic   ABDOMINAL SURGERY     removal of tumors   BALLOON DILATION N/A 02/16/2021   Procedure: BALLOON DILATION;  Surgeon: Vinetta Greening, DO;  [facility]: [facility];  Service: Endoscopy;  Laterality: N/A;   BIOPSY  02/16/2021   Procedure: BIOPSY;  Surgeon: Vinetta Greening, DO;  Location: [facility];  Service: Endoscopy;;   BREAST SURGERY Left  11/18/1981   benign tumor   CATARACT EXTRACTION W/PHACO Right 01/11/2014   Procedure: CATARACT EXTRACTION PHACO AND INTRAOCULAR LENS PLACEMENT (IOC);  Surgeon: Lavonia Powers T. Gennie Kicks, MD;  Location: [facility];  Service: Ophthalmology;  Laterality: Right;  CDE 5.57   CATARACT EXTRACTION W/PHACO Left 01/25/2014   Procedure: CATARACT EXTRACTION PHACO AND INTRAOCULAR LENS PLACEMENT (IOC);  Surgeon: Lavonia Powers T. Gennie Kicks, MD;  Location: [facility];  Service: Ophthalmology;  Laterality: Left;  CDE:5.19   COLONOSCOPY  2007 [facility]   NL EXAM   COLONOSCOPY N/A 04/22/2014   Dr. Fields:Normal mucosa in the terminal ileum/Two COLON polyps REMOVED/ Mild diverticulosis in the ascending colon and transverse colon/The LEFT colon IS redundant/Small internal hemorrhoids. Path: tubular adenoma. Next colonoscopy in 5-10 years   COLONOSCOPY WITH PROPOFOL  N/A 11/27/2020   Surgeon: Goble Last K, DO;  Nonbleeding internal hemorrhoids, one 6 mm transverse colon polyp resected, diverticulosis in the sigmoid colon.  Pathology with tubular adenoma.  Repeat in 5 years.   ESOPHAGOGASTRODUODENOSCOPY N/A 04/22/2014   Dr. Fields:MICROCYTIC ANEMIA MOST LIKELY DUE TO ASA/VOLTAREN /Small hiatal hernia/MODERATE Non-erosive gastritis. Negative H.pylori   ESOPHAGOGASTRODUODENOSCOPY (EGD) WITH PROPOFOL  N/A 02/16/2021   Surgeon: Vinetta Greening, DO; benign-appearing esophageal stenosis s/p dilation, gastritis biopsied.  Biopsies were benign.   INTERCOSTAL NERVE BLOCK Right 03/31/2024   Procedure: BLOCK, NERVE, INTERCOSTAL;  Surgeon: Zelphia Higashi, MD;  Location: [facility] OR;  Service: Thoracic;  Laterality: Right;   LEFT HEART CATH AND CORONARY ANGIOGRAPHY N/A 06/23/2018   Procedure: LEFT HEART CATH AND CORONARY ANGIOGRAPHY;  Surgeon: Lucendia Rusk, MD;  Location: [facility] INVASIVE CV LAB;  Service: Cardiovascular;  Laterality: N/A;   LYMPH NODE BIOPSY Right 03/31/2024   Procedure: LYMPH NODE BIOPSY;  Surgeon: Zelphia Higashi, MD;  Location: [facility]  OR;  Service: Thoracic;  Laterality: Right;   POLYPECTOMY  11/27/2020   Procedure: POLYPECTOMY;  Surgeon: Vinetta Greening, DO;  [facility];  Service: Endoscopy;;   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   ULTRASOUND GUIDANCE FOR VASCULAR ACCESS  06/23/2018   Procedure: Ultrasound Guidance For Vascular Access;  Surgeon: Lucendia Rusk, MD;  Location: [facility] INVASIVE CV LAB;  Service: Cardiovascular;;   UPPER GASTROINTESTINAL ENDOSCOPY  2007 CHEST PAIN   NL EXAM   WEDGE RESECTION, LUNG, ROBOT-ASSISTED, THORACOSCOPIC  03/31/2024   Procedure: WEDGE RESECTION, LUNG, ROBOT-ASSISTED, THORACOSCOPIC;  Surgeon: Zelphia Higashi, MD;  Location: [facility] OR;  Service: Thoracic;;   YAG LASER APPLICATION Right 07/12/2014   Procedure: YAG LASER APPLICATION;  Surgeon: Lavonia Powers T. Gennie Kicks, MD;  Location: [facility];  Service: Ophthalmology;  Laterality: Right;   YAG LASER APPLICATION Left 08/15/2015   Procedure: YAG LASER APPLICATION;  Surgeon: Albert Huff, MD;  Location: [facility];  Service: Ophthalmology;  Laterality: Left;     Allergies:  has no known allergies.  Medications:  Current Outpatient Medications  Medication Sig Dispense Refill   acetaminophen  (TYLENOL ) 650 MG CR tablet Take 650 mg by mouth 2 (two) times daily.     albuterol  (VENTOLIN  HFA) 108 (90 Base) MCG/ACT inhaler Inhale 2 puffs into the lungs every 6 (six) hours as needed for wheezing or shortness of breath. 8 g 0   amLODipine  (NORVASC ) 10 MG tablet TAKE  ONE TABLET BY MOUTH ONCE DAILY. 90 tablet 3   ASPIRIN  EC PO Take 325 mg by mouth daily. Swallow whole.     benazepril -hydrochlorthiazide (LOTENSIN  HCT) 20-12.5 MG tablet TAKE 2 TABLETS EVERY DAY 180 tablet 3   busPIRone  (BUSPAR ) 30 MG tablet Take one tablet by mouth two times daily 60 tablet 3   chlorthalidone  (HYGROTON ) 25 MG tablet TAKE 1 TABLET EVERY DAY 90 tablet 3   Cyanocobalamin  (VITAMIN B-12 PO) 1 tablet. Take 2 gummies daily.     ferrous gluconate  (FERGON)  324 MG tablet Take 324 mg by mouth daily with breakfast.     FLUoxetine  (PROZAC ) 40 MG capsule Take 1 capsule (40 mg total) by mouth daily. 90 capsule 3   gabapentin  (NEURONTIN ) 300 MG capsule Take 1 capsule (300 mg total) by mouth 2 (two) times daily. 60 capsule 3   Melatonin 1 MG CHEW Chew 2 mg by mouth at bedtime.     Multiple Vitamin (MULTIVITAMIN) tablet Take 1 tablet by mouth daily.     nicotine (NICODERM CQ - DOSED IN MG/24 HR) 7 mg/24hr patch Place 1 patch (7 mg total) onto the skin daily. 28 patch 0   omeprazole  (PRILOSEC) 40 MG capsule TAKE 1 CAPSULE TWICE DAILY FOR 2 MONTH ONLY 180 capsule 3   oxyCODONE  (OXY IR/ROXICODONE ) 5 MG immediate release tablet Take 1 tablet (5 mg total) by mouth every 6 (six) hours as needed for moderate pain (pain score 4-6). 30 tablet 0   polyethylene glycol (MIRALAX / GLYCOLAX) 17 g packet Take 17 g by mouth daily as needed for moderate constipation.     rosuvastatin  (CRESTOR ) 20 MG tablet TAKE 1 TABLET EVERY DAY 90 tablet 3   sucralfate  (CARAFATE ) 1 g tablet TAKE 1 TABLET TWICE DAILY 180 tablet 3   No current facility-administered medications for this visit.    Review of Systems: Constitutional: Denies fevers, chills or abnormal night sweats Eyes: Denies blurriness of vision, double vision or watery eyes Ears, nose, mouth, throat, and face: Denies mucositis or sore throat Respiratory: Denies cough, dyspnea or wheezes Cardiovascular: Denies palpitation, chest discomfort or lower extremity swelling Gastrointestinal:  Denies nausea, heartburn or change in bowel habits Skin: Denies abnormal skin rashes Lymphatics: Denies new lymphadenopathy or easy bruising Neurological:Denies numbness, tingling or new weaknesses Behavioral/Psych: Mood is stable, no new changes  All other systems were reviewed with the patient and are negative.  Physical Examination: ECOG PERFORMANCE STATUS: 2 - Symptomatic, <50% confined to bed  Vitals:   05/03/24 1112  Pulse: 64   Resp: 18  Temp: 98.3 F (36.8 C)  SpO2: 100%   Filed Weights   05/03/24 1112  Weight: 195 lb 15.8 oz (88.9 kg)    GENERAL:alert, no distress and comfortable, tearful SKIN: skin color, texture, turgor are normal, no rashes or significant lesions LUNGS: clear to auscultation and percussion with normal breathing effort HEART: regular rate & rhythm and no murmurs and no lower extremity edema ABDOMEN:abdomen soft, non-tender and normal bowel sounds Musculoskeletal:no cyanosis of digits and no clubbing  PSYCH: alert & oriented x 3 with fluent speech NEURO: no focal motor/sensory deficits   Laboratory Data: I have reviewed the data as listed Lab Results  Component Value Date   WBC 9.1 04/02/2024   HGB 9.9 (L) 04/02/2024   HCT 31.1 (L) 04/02/2024   MCV 82.5 04/02/2024   PLT 186 04/02/2024   Recent Labs    02/06/24 1048 03/15/24 0937 03/29/24 1400 04/01/24 0230 04/02/24 0300 04/09/24 1019  NA 145*   < > 143 139 139 141  K 4.0   < > 3.9 4.4 3.6 4.5  CL 102   < > 104 101 101 100  CO2 27   < > 30 25 30 26   GLUCOSE 108*   < > 136* 122* 111* 88  BUN 33*   < > 35* 18 29* 26  CREATININE 1.14*   < > 1.19* 1.09* 1.13* 1.04*  CALCIUM  10.3   < > 9.8 8.8* 9.2 10.2  GFRNONAA  --   --  48* 54* 51*  --   PROT 7.7  --  7.3  --  6.4*  --   ALBUMIN 4.9*  --  4.1  --  3.5  --   AST 18  --  19  --  27  --   ALT 15  --  20  --  46*  --   ALKPHOS 69  --  48  --  47  --   BILITOT 0.2  --  0.7  --  0.4  --    < > = values in this interval not displayed.   Endoscopy: 02/16/2021 Impression:  - Benign- appearing esophageal stenosis. Dilated.  - Gastritis. Biopsied.  - Normal duodenal bulb, first portion of the duodenum and second portion of the duodenum.  Colonoscopy: 11/27/2020 Impression:  - Non- bleeding internal hemorrhoids.  - One 6 mm polyp in the transverse colon, removed with a cold snare. Resected and retrieved.  - Diverticulosis in the sigmoid colon.  Radiographic Studies: I  have personally reviewed the radiological images as listed and agreed with the findings in the report.  Narrative & Impression  CLINICAL DATA:  Initial treatment strategy for lung nodule.   EXAM: NUCLEAR MEDICINE PET SKULL BASE TO THIGH   TECHNIQUE: 9.9 mCi F-18 FDG was injected intravenously. Full-ring PET imaging was performed from the skull base to thigh after the radiotracer. CT data was obtained and used for attenuation correction and anatomic localization.   Fasting blood glucose: 109 mg/dl   COMPARISON:  CT chest 12/13/2023, MR abdomen 11/03/2023, CT abdomen 08/11/2022 and 12/24/2020.   FINDINGS: Mediastinal blood pool activity: SUV max 2.8   Liver activity: SUV max NA   NECK:   No abnormal hypermetabolism.   Incidental CT findings:   None.   CHEST:   10 mm posterolateral right lower lobe nodule (7/40), SUV max 2.3. No additional abnormal hypermetabolism.   Incidental CT findings:   Atherosclerotic calcification of the aorta, aortic valve and coronary arteries. Enlarged pulmonic trunk and heart. Small pericardial effusion, minimally improved.   ABDOMEN/PELVIS:   No abnormal hypermetabolism.   Incidental CT findings:   2.0 cm left adrenal nodule measures 5 Hounsfield units. No specific follow-up necessary. Punctate right renal stone.   SKELETON:   No abnormal hypermetabolism.   Incidental CT findings:   Degenerative changes in the spine.   IMPRESSION: 1. 10 mm right lower lobe nodule does not show metabolism above blood pool but has enlarged from 08/11/2022. Low-grade adenocarcinoma cannot be definitively excluded. Recommend short-term follow-up CT chest without contrast in 3 months. 2. Small pericardial effusion, minimally improved. 3. Left adrenal adenoma. 4. Punctate right renal stone. 5. Aortic atherosclerosis (ICD10-I70.0). Coronary artery calcification. 6. Enlarged pulmonic trunk, indicative of pulmonary arterial hypertension.      Electronically Signed   By: Shearon Denis M.D.   On: 01/26/2024 16:04      ASSESSMENT & PLAN:  Patient is a 74 y.o.  female presenting for non-small cell lung cancer of right lung  Non-small cell carcinoma of lung, right (HCC) Patient has stage Ib non-small cell lung cancer s/p right lobectomy(stage Ib due to residual pleural invasion) Does not meet criteria for chemotherapy or targeted therapy(all studies with targeted therapy are more applicable for tumor size greater than 4 cm) No indication for further radiation therapy at this time  -Will do surveillance per NCCN guideline - H&P and chest CT with or without contrast every 6 months for 2 to 3 years, then H&P and and low-dose noncontrast enhanced chest CT annually  Will obtain a CT scan in 6 months.  Normocytic anemia Patient has normocytic anemia since 04/06/2024.  Likely nutritional deficiency/blood loss Last endoscopy 03/07/2021: Showed gastritis Last colonoscopy: 12/07/2020: No active bleeding Patient also has chronic kidney disease stage III  - Will obtain labs today including iron  panel, ferritin, vitamin B12, folate.  Will replace as needed. -Will also workup for multiple myeloma Return to clinic in 2 weeks to discuss results and further management  Tobacco dependence Continued snuff tobacco use, desires cessation.  Previous nicotine-free snuff attempts failed.   - Prescribe low-dose nicotine patch. - Send prescription to [facility].  Orders Placed This Encounter  Procedures   Ferritin    Standing Status:   Future    Number of Occurrences:   1    Expected Date:   05/03/2024    Expiration Date:   08/01/2024   Folate    Standing Status:   Future    Number of Occurrences:   1    Expected Date:   05/03/2024    Expiration Date:   08/01/2024   Vitamin B12    Standing Status:   Future    Number of Occurrences:   1    Expected Date:   05/03/2024    Expiration Date:   08/01/2024   CBC with  Differential/Platelet    Standing Status:   Future    Number of Occurrences:   1    Expected Date:   05/03/2024    Expiration Date:   08/01/2024   Comprehensive metabolic panel with GFR    Standing Status:   Future    Number of Occurrences:   1    Expected Date:   05/03/2024    Expiration Date:   08/01/2024   Iron  and TIBC    Standing Status:   Future    Number of Occurrences:   1    Expected Date:   05/03/2024    Expiration Date:   08/01/2024   Kappa/lambda light chains    Standing Status:   Future    Number of Occurrences:   1    Expected Date:   05/03/2024    Expiration Date:   08/01/2024   Multiple Myeloma Panel (SPEP&IFE w/QIG)    Standing Status:   Future    Number of Occurrences:   1    Expected Date:   05/03/2024    Expiration Date:   08/01/2024   Copper, serum    Standing Status:   Future    Number of Occurrences:   1    Expected Date:   05/03/2024    Expiration Date:   05/03/2025    The total time spent in the appointment was 60 minutes encounter with patients including review of chart and various tests results, discussions about plan of care and coordination of care plan   All questions were answered. The patient knows to call  the clinic with any problems, questions or concerns. No barriers to learning was detected.  Eduardo Grade, MD 6/16/202511:56 AM

## 2024-05-03 NOTE — Assessment & Plan Note (Addendum)
 Patient has stage Ib non-small cell lung cancer s/p right lobectomy(stage Ib due to residual pleural invasion) Does not meet criteria for chemotherapy or targeted therapy(all studies with targeted therapy are more applicable for tumor size greater than 4 cm) No indication for further radiation therapy at this time  -Will do surveillance per NCCN guideline - H&P and chest CT with or without contrast every 6 months for 2 to 3 years, then H&P and and low-dose noncontrast enhanced chest CT annually  Will obtain a CT scan in 6 months.

## 2024-05-03 NOTE — Assessment & Plan Note (Signed)
 Continued snuff tobacco use, desires cessation.  Previous nicotine-free snuff attempts failed.   - Prescribe low-dose nicotine patch. - Send prescription to [facility].

## 2024-05-04 LAB — KAPPA/LAMBDA LIGHT CHAINS
Kappa free light chain: 24.3 mg/L — ABNORMAL HIGH (ref 3.3–19.4)
Kappa, lambda light chain ratio: 1.11 (ref 0.26–1.65)
Lambda free light chains: 21.8 mg/L (ref 5.7–26.3)

## 2024-05-05 LAB — MULTIPLE MYELOMA PANEL, SERUM
Albumin SerPl Elph-Mcnc: 3.7 g/dL (ref 2.9–4.4)
Albumin/Glob SerPl: 1.2 (ref 0.7–1.7)
Alpha 1: 0.2 g/dL (ref 0.0–0.4)
Alpha2 Glob SerPl Elph-Mcnc: 0.8 g/dL (ref 0.4–1.0)
B-Globulin SerPl Elph-Mcnc: 1.1 g/dL (ref 0.7–1.3)
Gamma Glob SerPl Elph-Mcnc: 1.1 g/dL (ref 0.4–1.8)
Globulin, Total: 3.2 g/dL (ref 2.2–3.9)
IgA: 224 mg/dL (ref 64–422)
IgG (Immunoglobin G), Serum: 1072 mg/dL (ref 586–1602)
IgM (Immunoglobulin M), Srm: 59 mg/dL (ref 26–217)
Total Protein ELP: 6.9 g/dL (ref 6.0–8.5)

## 2024-05-06 LAB — COPPER, SERUM: Copper: 92 ug/dL (ref 80–158)

## 2024-05-11 ENCOUNTER — Ambulatory Visit (INDEPENDENT_AMBULATORY_CARE_PROVIDER_SITE_OTHER): Payer: Self-pay | Admitting: Thoracic Surgery (Cardiothoracic Vascular Surgery)

## 2024-05-11 ENCOUNTER — Ambulatory Visit
Admission: RE | Admit: 2024-05-11 | Discharge: 2024-05-11 | Disposition: A | Discharge status: Home / Self Care | Source: Ambulatory Visit | Attending: Thoracic Surgery (Cardiothoracic Vascular Surgery) | Admitting: Thoracic Surgery (Cardiothoracic Vascular Surgery)

## 2024-05-11 VITALS — BP 108/66 | HR 67 | Resp 20 | Ht 69.0 in | Wt 195.0 lb

## 2024-05-11 DIAGNOSIS — Z87891 Personal history of nicotine dependence: Secondary | ICD-10-CM | POA: Insufficient documentation

## 2024-05-11 DIAGNOSIS — R918 Other nonspecific abnormal finding of lung field: Secondary | ICD-10-CM | POA: Diagnosis not present

## 2024-05-11 DIAGNOSIS — C3431 Malignant neoplasm of lower lobe, right bronchus or lung: Secondary | ICD-10-CM | POA: Insufficient documentation

## 2024-05-11 DIAGNOSIS — M1990 Unspecified osteoarthritis, unspecified site: Secondary | ICD-10-CM | POA: Insufficient documentation

## 2024-05-11 DIAGNOSIS — F32A Depression, unspecified: Secondary | ICD-10-CM | POA: Insufficient documentation

## 2024-05-11 DIAGNOSIS — Z483 Aftercare following surgery for neoplasm: Secondary | ICD-10-CM | POA: Diagnosis not present

## 2024-05-11 DIAGNOSIS — F419 Anxiety disorder, unspecified: Secondary | ICD-10-CM | POA: Diagnosis not present

## 2024-05-11 DIAGNOSIS — Z09 Encounter for follow-up examination after completed treatment for conditions other than malignant neoplasm: Secondary | ICD-10-CM

## 2024-05-11 DIAGNOSIS — E119 Type 2 diabetes mellitus without complications: Secondary | ICD-10-CM | POA: Insufficient documentation

## 2024-05-11 DIAGNOSIS — I2510 Atherosclerotic heart disease of native coronary artery without angina pectoris: Secondary | ICD-10-CM | POA: Insufficient documentation

## 2024-05-11 DIAGNOSIS — R911 Solitary pulmonary nodule: Secondary | ICD-10-CM | POA: Diagnosis present

## 2024-05-11 DIAGNOSIS — I10 Essential (primary) hypertension: Secondary | ICD-10-CM | POA: Insufficient documentation

## 2024-05-11 DIAGNOSIS — E669 Obesity, unspecified: Secondary | ICD-10-CM | POA: Diagnosis not present

## 2024-05-11 DIAGNOSIS — K219 Gastro-esophageal reflux disease without esophagitis: Secondary | ICD-10-CM | POA: Diagnosis not present

## 2024-05-11 DIAGNOSIS — G4730 Sleep apnea, unspecified: Secondary | ICD-10-CM | POA: Insufficient documentation

## 2024-05-11 DIAGNOSIS — E785 Hyperlipidemia, unspecified: Secondary | ICD-10-CM | POA: Insufficient documentation

## 2024-05-11 DIAGNOSIS — I700 Atherosclerosis of aorta: Secondary | ICD-10-CM | POA: Diagnosis not present

## 2024-05-11 NOTE — Progress Notes (Signed)
 [address], [place] [address]             [phone]     HPI: Sophia [name] returns for follow-up after recent wedge resection.  Sophia [name] is a 73 year old woman with a history of hypertension, hyperlipidemia, type 2 diabetes, obesity, sleep apnea, COVID-19, coronary calcification, aortic atherosclerosis, reflux, arthritis, anxiety, depression, remote mild tobacco use, and stage Ib adenocarcinoma of the lung.  Smoked half a pack of cigarettes a day for 20 years before quitting in 1991.  There is noted to have a lung nodule on CT of the abdomen in 2023.  Recently had a follow-up which showed increase in size of the nodule.  10 mm nodule mildly hypermetabolic on PET with SUV of 2.3.  She under went a robotic assisted right lower lobe wedge resection on 03/31/2024.  Nodule was a T2a, N0, stage Ib adenocarcinoma.  She went home on day 3.  I saw her in the office on 04/13/2024.  She was doing well at that time with only mild postoperative pain.  Today she feels well.  Says she gets tired with activity.  Being worked up for anemia.  Suspect her anemia is just postoperative.  Has some discomfort along the right costal margin but not requiring any pain medication.  Past Medical History:  Diagnosis Date   Acute respiratory disease due to COVID-19 virus 08/28/2019   Anxiety    Arthritis    Depression    Diabetes mellitus type 2 in obese 11/28/2010   Qualifier: Diagnosis of  By: Antonetta MD, Margaret  Diet controlled in 12/2016    Diabetes mellitus without complication (HCC)    GERD (gastroesophageal reflux disease)    Hypercholesteremia    Hypertension    PONV (postoperative nausea and vomiting)    Sleep apnea     Current Outpatient Medications  Medication Sig Dispense Refill   acetaminophen  (TYLENOL ) 650 MG CR tablet Take 650 mg by mouth 2 (two) times daily.     albuterol  (VENTOLIN  HFA) 108 (90 Base) MCG/ACT inhaler Inhale 2 puffs into the lungs every 6  (six) hours as needed for wheezing or shortness of breath. 8 g 0   amLODipine  (NORVASC ) 10 MG tablet TAKE ONE TABLET BY MOUTH ONCE DAILY. 90 tablet 3   ASPIRIN  EC PO Take 325 mg by mouth daily. Swallow whole.     benazepril -hydrochlorthiazide (LOTENSIN  HCT) 20-12.5 MG tablet TAKE 2 TABLETS EVERY DAY 180 tablet 3   busPIRone  (BUSPAR ) 30 MG tablet Take one tablet by mouth two times daily 60 tablet 3   chlorthalidone  (HYGROTON ) 25 MG tablet TAKE 1 TABLET EVERY DAY 90 tablet 3   Cyanocobalamin  (VITAMIN B-12 PO) 1 tablet. Take 2 gummies daily.     ferrous gluconate  (FERGON) 324 MG tablet Take 324 mg by mouth daily with breakfast.     FLUoxetine  (PROZAC ) 40 MG capsule Take 1 capsule (40 mg total) by mouth daily. 90 capsule 3   gabapentin  (NEURONTIN ) 300 MG capsule Take 1 capsule (300 mg total) by mouth 2 (two) times daily. 60 capsule 3   Melatonin 1 MG CHEW Chew 2 mg by mouth at bedtime.     Multiple Vitamin (MULTIVITAMIN) tablet Take 1 tablet by mouth daily.     nicotine (NICODERM CQ - DOSED IN MG/24 HR) 7 mg/24hr patch Place 1 patch (7 mg total) onto the skin daily. 28 patch 0   omeprazole  (PRILOSEC) 40 MG capsule TAKE 1  CAPSULE TWICE DAILY FOR 2 MONTH ONLY 180 capsule 3   oxyCODONE  (OXY IR/ROXICODONE ) 5 MG immediate release tablet Take 1 tablet (5 mg total) by mouth every 6 (six) hours as needed for moderate pain (pain score 4-6). 30 tablet 0   polyethylene glycol (MIRALAX / GLYCOLAX) 17 g packet Take 17 g by mouth daily as needed for moderate constipation.     rosuvastatin  (CRESTOR ) 20 MG tablet TAKE 1 TABLET EVERY DAY 90 tablet 3   sucralfate  (CARAFATE ) 1 g tablet TAKE 1 TABLET TWICE DAILY 180 tablet 3   No current facility-administered medications for this visit.    Physical Exam BP 108/66   Pulse 67   Resp 20   Ht 5' 9 (1.753 m)   Wt 195 lb (88.5 kg)   SpO2 97% Comment: RA  BMI 28.21 kg/m  73 year old woman in no acute distress Alert and oriented x 3 with no focal deficits Lungs  slightly diminished to right base but otherwise clear Incisions clean dry intact Cardiac regular rate and rhythm No peripheral edema  Diagnostic Tests: CHEST - 2 VIEW   COMPARISON:  04/13/2024   FINDINGS: Decreasing facet at the right lung base with minimal residual right basilar opacity. No effusion or pneumothorax. Left lung clear. Heart and mediastinal contours within normal limits. Aortic atherosclerosis.   IMPRESSION: Minimal residual right basilar opacity, favor atelectasis.   No pneumothorax.     Electronically Signed   By: Franky Crease M.D.   On: 05/11/2024 14:57 I personally reviewed the chest x-ray images.  Postop findings.  Impression: Sophia [name] is a 73 year old woman with a history of hypertension, hyperlipidemia, type 2 diabetes, obesity, sleep apnea, COVID-19, coronary calcification, aortic atherosclerosis, reflux, arthritis, anxiety, depression, remote mild tobacco use, and stage Ib adenocarcinoma of the lung.  Stage Ib adenocarcinoma of the lung-she saw Dr. Davonna.  No indication for adjuvant therapy at this time.  Will need follow-up per protocol.  Status post wedge resection-doing well.  Not having much pain.  No restrictions on her activities from my standpoint.  She may begin driving.  Encouraged her to be more active.  Will take some time to build back up her stamina.  Plan: Return in 6 months after CT with Dr. Davonna Elspeth JAYSON Kerrin, MD [facility] [phone]

## 2024-05-16 ENCOUNTER — Other Ambulatory Visit: Payer: Self-pay | Admitting: Family Medicine

## 2024-05-18 ENCOUNTER — Other Ambulatory Visit: Payer: Self-pay | Admitting: Internal Medicine

## 2024-05-24 ENCOUNTER — Inpatient Hospital Stay: Attending: Oncology | Admitting: Oncology

## 2024-05-24 VITALS — BP 130/67 | HR 71 | Temp 97.9°F | Resp 18 | Wt 197.8 lb

## 2024-05-24 DIAGNOSIS — C3431 Malignant neoplasm of lower lobe, right bronchus or lung: Secondary | ICD-10-CM | POA: Insufficient documentation

## 2024-05-24 DIAGNOSIS — Z7982 Long term (current) use of aspirin: Secondary | ICD-10-CM | POA: Insufficient documentation

## 2024-05-24 DIAGNOSIS — Z79899 Other long term (current) drug therapy: Secondary | ICD-10-CM | POA: Diagnosis not present

## 2024-05-24 DIAGNOSIS — D649 Anemia, unspecified: Secondary | ICD-10-CM | POA: Diagnosis not present

## 2024-05-24 DIAGNOSIS — C3491 Malignant neoplasm of unspecified part of right bronchus or lung: Secondary | ICD-10-CM

## 2024-05-24 NOTE — Progress Notes (Signed)
 Patient Care Team: Antonetta Rollene BRAVO, MD as PCP - General (Family Medicine) Stacia Diannah SQUIBB, MD as PCP - Cardiology (Cardiology) Roz Anes, MD as Consulting Physician (Ophthalmology) Margrette Taft BRAVO, MD as Consulting Physician (Orthopedic Surgery) Cindie Carlin POUR, DO as Consulting Physician (Internal Medicine)  Clinic Day:  05/24/2024  Referring physician: Antonetta Rollene BRAVO, MD   CHIEF COMPLAINT:  CC: Stage Ib non-small cell lung cancer s/p lobectomy  ASSESSMENT & PLAN:   Assessment & Plan: Sophia [name]  is a 73 y.o. female with stage Ib non-small cell lung cancer  Non-small cell carcinoma of lung, right (HCC) Patient has stage Ib non-small cell lung cancer s/p right lobectomy(stage Ib due to residual pleural invasion) Does not meet criteria for chemotherapy or targeted therapy(all studies with targeted therapy are more applicable for tumor size greater than 4 cm) No indication for further radiation therapy at this time  -Will do surveillance per NCCN guideline - H&P and chest CT with or without contrast every 6 months for 2 to 3 years, then H&P and and low-dose noncontrast enhanced chest CT annually - Next CT scan due in August  Return to clinic after the CT scan to discuss results and further management  Normocytic anemia Patient has normocytic anemia since 04/06/2024.  Likely nutritional deficiency/blood loss Last endoscopy 03/07/2021: Showed gastritis Last colonoscopy: 12/07/2020: No active bleeding Multiple myeloma workup: SPEP: No M spike, IFE: Normal Nutritional workup: Normal  - Labs consistent with slight improvement in hemoglobin.  Likely secondary to surgical blood loss. -Will repeat labs in 2 months.  Continue to monitor for now.    The patient understands the plans discussed today and is in agreement with them.  She knows to contact our office if she develops concerns prior to her next appointment.  I provided 20 minutes of face-to-face time  during this encounter and > 50% was spent counseling as documented under my assessment and plan.    Mickiel Dry, MD  [facility] - A DEPT OF [name] [address] [place] [address] Dept: [phone] Dept Fax: [phone]   Orders Placed This Encounter  Procedures   CT CHEST ABDOMEN PELVIS W CONTRAST    Standing Status:   Future    Expected Date:   07/08/2024    Expiration Date:   05/24/2025    If indicated for the ordered procedure, I authorize the administration of contrast media per Radiology protocol:   Yes    Does the patient have a contrast media/X-ray dye allergy?:   No    Preferred imaging location?:   [facility]    If indicated for the ordered procedure, I authorize the administration of oral contrast media per Radiology protocol:   Yes   CBC with Differential/Platelet    Standing Status:   Future    Expected Date:   07/08/2024    Expiration Date:   10/06/2024   Comprehensive metabolic panel with GFR    Standing Status:   Future    Expected Date:   07/08/2024    Expiration Date:   10/06/2024     ONCOLOGY HISTORY:   Oncology History  Non-small cell carcinoma of lung, right (HCC)  12/13/2023 Imaging   Chest CT without contrast:  IMPRESSION: 1. 10 x 8 mm subpleural right posterior lower lobe pulmonary nodule. Given interval change in appearance, recommend multi disciplinary thoracic consultation and potential PET CT follow-up. 2. Aortic atherosclerosis.  01/09/2024 PET scan   IMPRESSION: 1. 10 mm right lower lobe nodule does not show metabolism above blood pool but has enlarged from 08/11/2022. Low-grade adenocarcinoma cannot be definitively excluded. Recommend short-term follow-up CT chest without contrast in 3 months. 2. Small pericardial effusion, minimally improved. 3. Left adrenal adenoma.   03/31/2024 Pathology Results   FINAL MICROSCOPIC DIAGNOSIS:  A. LUNG, RIGHT LOWER  LOBE, WEDGE RESECTION: Adenocarcinoma, acinar, 1.1 cm. Carcinoma involves visceral pleural connective tissue (pT2a). Surgical margins negative for carcinoma. Negative for lymphovascular involvement. Lymphovascular Invasion: Not identified Regional Lymph Nodes:Nodal Sites Examined: 4R, 7, 8, 10, 11: Negative Pathologic Stage Classification (pTNM, AJCC 8th Edition): pT2a, pN0    05/03/2024 Initial Diagnosis   Non-small cell carcinoma of lung, right (HCC)   05/03/2024 Cancer Staging   Staging form: Lung, AJCC V9 - Pathologic stage from 05/03/2024: Stage IB (pT2a, pN0, cM0) - Signed by Davonna Siad, MD on 05/03/2024 Stage prefix: Initial diagnosis Method of lymph node assessment: Lymph node dissection      Current Treatment:  Surveillance  INTERVAL HISTORY:  Sophia [name] is here today for follow up. Patient is accompanied by by her son and niece today.She experiences some soreness and occasional sharp pain on the same side as the surgery but does not require pain medication. She denies fevers or chills. She reports ongoing grief after the loss of her husband, which has been affecting her emotional well-being. She uses marijuana to help manage her crying episodes, stating that it is the only thing that stops her tears. No cigarette smoking.She denies pain. Her appetite is good. Her weight has been stable.  I have reviewed the past medical history, past surgical history, social history and family history with the patient and they are unchanged from previous note.  ALLERGIES:  has no known allergies.  MEDICATIONS:  Current Outpatient Medications  Medication Sig Dispense Refill   acetaminophen  (TYLENOL ) 650 MG CR tablet Take 650 mg by mouth 2 (two) times daily.     albuterol  (VENTOLIN  HFA) 108 (90 Base) MCG/ACT inhaler Inhale 2 puffs into the lungs every 6 (six) hours as needed for wheezing or shortness of breath. 8 g 0   amLODipine  (NORVASC ) 10 MG tablet TAKE ONE TABLET BY MOUTH ONCE  DAILY. 90 tablet 3   ASPIRIN  EC PO Take 325 mg by mouth daily. Swallow whole.     benazepril -hydrochlorthiazide (LOTENSIN  HCT) 20-12.5 MG tablet TAKE 2 TABLETS EVERY DAY 180 tablet 3   busPIRone  (BUSPAR ) 30 MG tablet Take one tablet by mouth two times daily 60 tablet 3   chlorthalidone  (HYGROTON ) 25 MG tablet TAKE 1 TABLET EVERY DAY 90 tablet 3   Cyanocobalamin  (VITAMIN B-12 PO) 1 tablet. Take 2 gummies daily.     ferrous gluconate  (FERGON) 324 MG tablet Take 324 mg by mouth daily with breakfast.     FLUoxetine  (PROZAC ) 40 MG capsule Take 1 capsule (40 mg total) by mouth daily. 90 capsule 3   gabapentin  (NEURONTIN ) 300 MG capsule Take 1 capsule (300 mg total) by mouth 2 (two) times daily. 60 capsule 3   Melatonin 1 MG CHEW Chew 2 mg by mouth at bedtime.     Multiple Vitamin (MULTIVITAMIN) tablet Take 1 tablet by mouth daily.     nicotine  (NICODERM CQ  - DOSED IN MG/24 HR) 7 mg/24hr patch Place 1 patch (7 mg total) onto the skin daily. 28 patch 0   olmesartan -hydrochlorothiazide  (BENICAR  HCT) 20-12.5 MG tablet TAKE ONE TABLET BY MOUTH EVERY DAY 30 tablet 11  omeprazole  (PRILOSEC) 40 MG capsule TAKE 1 CAPSULE TWICE DAILY FOR 2 MONTH ONLY 180 capsule 3   oxyCODONE  (OXY IR/ROXICODONE ) 5 MG immediate release tablet Take 1 tablet (5 mg total) by mouth every 6 (six) hours as needed for moderate pain (pain score 4-6). 30 tablet 0   polyethylene glycol (MIRALAX / GLYCOLAX) 17 g packet Take 17 g by mouth daily as needed for moderate constipation.     rosuvastatin  (CRESTOR ) 20 MG tablet TAKE 1 TABLET EVERY DAY 90 tablet 3   sucralfate  (CARAFATE ) 1 g tablet TAKE 1 TABLET TWICE DAILY 180 tablet 3   traZODone  (DESYREL ) 50 MG tablet Take 25-50 mg by mouth at bedtime as needed.     No current facility-administered medications for this visit.    REVIEW OF SYSTEMS:   Constitutional: Denies fevers, chills or abnormal weight loss Eyes: Denies blurriness of vision Ears, nose, mouth, throat, and face: Denies  mucositis or sore throat Respiratory: Denies cough, dyspnea or wheezes Cardiovascular: Denies palpitation, chest discomfort or lower extremity swelling Gastrointestinal:  Denies nausea, heartburn or change in bowel habits Skin: Denies abnormal skin rashes Lymphatics: Denies new lymphadenopathy or easy bruising Neurological:Denies numbness, tingling or new weaknesses Behavioral/Psych: Mood is stable, no new changes  All other systems were reviewed with the patient and are negative.   VITALS:  Blood pressure 130/67, pulse 71, temperature 97.9 F (36.6 C), temperature source Tympanic, resp. rate 18, weight 197 lb 12.8 oz (89.7 kg), SpO2 99%.  Wt Readings from Last 3 Encounters:  05/24/24 197 lb 12.8 oz (89.7 kg)  05/11/24 195 lb (88.5 kg)  05/03/24 195 lb 15.8 oz (88.9 kg)    Body mass index is 29.21 kg/m.  Performance status (ECOG): 2 - Symptomatic, <50% confined to bed  PHYSICAL EXAM:   GENERAL:alert, no distress and comfortable LUNGS: clear to auscultation and percussion with normal breathing effort HEART: regular rate & rhythm and no murmurs and no lower extremity edema ABDOMEN:abdomen soft, non-tender and normal bowel sounds Musculoskeletal:no cyanosis of digits and no clubbing  NEURO: alert & oriented x 3 with fluent speech  LABORATORY DATA:  I have reviewed the data as listed    Component Value Date/Time   NA 140 05/03/2024 1142   NA 141 04/09/2024 1019   K 3.5 05/03/2024 1142   CL 103 05/03/2024 1142   CO2 27 05/03/2024 1142   GLUCOSE 105 (H) 05/03/2024 1142   BUN 29 (H) 05/03/2024 1142   BUN 26 04/09/2024 1019   CREATININE 0.93 05/03/2024 1142   CREATININE 0.97 (H) 09/01/2020 0941   CALCIUM  9.5 05/03/2024 1142   PROT 7.4 05/03/2024 1142   PROT 7.7 02/06/2024 1048   ALBUMIN 4.0 05/03/2024 1142   ALBUMIN 4.9 (H) 02/06/2024 1048   AST 18 05/03/2024 1142   ALT 22 05/03/2024 1142   ALKPHOS 60 05/03/2024 1142   BILITOT 0.4 05/03/2024 1142   BILITOT 0.2  02/06/2024 1048   GFRNONAA >60 05/03/2024 1142   GFRNONAA 59 (L) 09/01/2020 0941   GFRAA 79 11/30/2020 1111   GFRAA 69 09/01/2020 0941    Lab Results  Component Value Date   WBC 5.9 05/03/2024   NEUTROABS 3.4 05/03/2024   HGB 10.6 (L) 05/03/2024   HCT 33.7 (L) 05/03/2024   MCV 84.5 05/03/2024   PLT 215 05/03/2024      Chemistry      Component Value Date/Time   NA 140 05/03/2024 1142   NA 141 04/09/2024 1019   K 3.5 05/03/2024 1142  CL 103 05/03/2024 1142   CO2 27 05/03/2024 1142   BUN 29 (H) 05/03/2024 1142   BUN 26 04/09/2024 1019   CREATININE 0.93 05/03/2024 1142   CREATININE 0.97 (H) 09/01/2020 0941      Component Value Date/Time   CALCIUM  9.5 05/03/2024 1142   ALKPHOS 60 05/03/2024 1142   AST 18 05/03/2024 1142   ALT 22 05/03/2024 1142   BILITOT 0.4 05/03/2024 1142   BILITOT 0.2 02/06/2024 1048      Latest Reference Range & Units 05/03/24 11:43  Iron  28 - 170 ug/dL 58  UIBC ug/dL 727  TIBC 749 - 549 ug/dL 669  Saturation Ratios 10.4 - 31.8 % 18  Ferritin 11 - 307 ng/mL 171  Folate >5.9 ng/mL 31.1  Vitamin B12 180 - 914 pg/mL 1,454 (H)  (H): Data is abnormally high   Latest Reference Range & Units 05/03/24 11:42  Total Protein ELP 6.0 - 8.5 g/dL 6.9 (C)  Albumin SerPl Elph-Mcnc 2.9 - 4.4 g/dL 3.7 (C)  Albumin/Glob SerPl 0.7 - 1.7  1.2 (C)  Alpha2 Glob SerPl Elph-Mcnc 0.4 - 1.0 g/dL 0.8 (C)  Alpha 1 0.0 - 0.4 g/dL 0.2 (C)  Gamma Glob SerPl Elph-Mcnc 0.4 - 1.8 g/dL 1.1 (C)  M Protein SerPl Elph-Mcnc Not Observed g/dL Not Observed (C)  IFE 1  Comment (C)  Globulin, Total 2.2 - 3.9 g/dL 3.2 (C)  B-Globulin SerPl Elph-Mcnc 0.7 - 1.3 g/dL 1.1 (C)  IgG (Immunoglobin G), Serum 586 - 1,602 mg/dL 8,927  IgM (Immunoglobulin M), Srm 26 - 217 mg/dL 59  IgA 64 - 577 mg/dL 775  (C): Corrected   Latest Reference Range & Units 05/03/24 11:42  Kappa free light chain 3.3 - 19.4 mg/L 24.3 (H)  Lambda free light chains 5.7 - 26.3 mg/L 21.8  Kappa, lambda light chain  ratio 0.26 - 1.65  1.11  (H): Data is abnormally high  Latest Reference Range & Units 05/03/24 11:42  Copper  80 - 158 ug/dL 92    RADIOGRAPHIC STUDIES: I have personally reviewed the radiological images as listed and agreed with the findings in the report.  DG Chest 2 View Result Date: 05/11/2024 CLINICAL DATA:  Wedge resection EXAM: CHEST - 2 VIEW COMPARISON:  04/13/2024 FINDINGS: Decreasing facet at the right lung base with minimal residual right basilar opacity. No effusion or pneumothorax. Left lung clear. Heart and mediastinal contours within normal limits. Aortic atherosclerosis. IMPRESSION: Minimal residual right basilar opacity, favor atelectasis. No pneumothorax. Electronically Signed   By: Franky Crease M.D.   On: 05/11/2024 14:57

## 2024-05-24 NOTE — Assessment & Plan Note (Signed)
 Patient has normocytic anemia since 04/06/2024.  Likely nutritional deficiency/blood loss Last endoscopy 03/07/2021: Showed gastritis Last colonoscopy: 12/07/2020: No active bleeding Multiple myeloma workup: SPEP: No M spike, IFE: Normal Nutritional workup: Normal  - Labs consistent with slight improvement in hemoglobin.  Likely secondary to surgical blood loss. -Will repeat labs in 2 months.  Continue to monitor for now.

## 2024-05-24 NOTE — Assessment & Plan Note (Signed)
 Patient has stage Ib non-small cell lung cancer s/p right lobectomy(stage Ib due to residual pleural invasion) Does not meet criteria for chemotherapy or targeted therapy(all studies with targeted therapy are more applicable for tumor size greater than 4 cm) No indication for further radiation therapy at this time  -Will do surveillance per NCCN guideline - H&P and chest CT with or without contrast every 6 months for 2 to 3 years, then H&P and and low-dose noncontrast enhanced chest CT annually - Next CT scan due in August  Return to clinic after the CT scan to discuss results and further management

## 2024-05-27 DIAGNOSIS — E785 Hyperlipidemia, unspecified: Secondary | ICD-10-CM | POA: Diagnosis not present

## 2024-05-27 DIAGNOSIS — E1169 Type 2 diabetes mellitus with other specified complication: Secondary | ICD-10-CM | POA: Diagnosis not present

## 2024-05-27 DIAGNOSIS — I10 Essential (primary) hypertension: Secondary | ICD-10-CM | POA: Diagnosis not present

## 2024-05-28 LAB — CMP14+EGFR
ALT: 17 IU/L (ref 0–32)
AST: 15 IU/L (ref 0–40)
Albumin: 4.4 g/dL (ref 3.8–4.8)
Alkaline Phosphatase: 57 IU/L (ref 44–121)
BUN/Creatinine Ratio: 28 (ref 12–28)
BUN: 33 mg/dL — ABNORMAL HIGH (ref 8–27)
Bilirubin Total: <0.2 mg/dL (ref 0.0–1.2)
CO2: 23 mmol/L (ref 20–29)
Calcium: 9.5 mg/dL (ref 8.7–10.3)
Chloride: 102 mmol/L (ref 96–106)
Creatinine, Ser: 1.19 mg/dL — ABNORMAL HIGH (ref 0.57–1.00)
Globulin, Total: 2 g/dL (ref 1.5–4.5)
Glucose: 115 mg/dL — ABNORMAL HIGH (ref 70–99)
Potassium: 4.1 mmol/L (ref 3.5–5.2)
Sodium: 143 mmol/L (ref 134–144)
Total Protein: 6.4 g/dL (ref 6.0–8.5)
eGFR: 48 mL/min/1.73 — ABNORMAL LOW (ref >59)

## 2024-05-28 LAB — LIPID PANEL
Chol/HDL Ratio: 2.3 ratio (ref 0.0–4.4)
Cholesterol, Total: 117 mg/dL (ref 100–199)
HDL: 51 mg/dL (ref >39)
LDL Chol Calc (NIH): 50 mg/dL (ref 0–99)
Triglycerides: 79 mg/dL (ref 0–149)
VLDL Cholesterol Cal: 16 mg/dL (ref 5–40)

## 2024-05-28 LAB — HEMOGLOBIN A1C
Est. average glucose Bld gHb Est-mCnc: 120 mg/dL
Hgb A1c MFr Bld: 5.8 % — ABNORMAL HIGH (ref 4.8–5.6)

## 2024-06-01 ENCOUNTER — Ambulatory Visit: Attending: Internal Medicine | Admitting: Internal Medicine

## 2024-06-01 ENCOUNTER — Encounter: Payer: Self-pay | Admitting: Family Medicine

## 2024-06-01 ENCOUNTER — Encounter: Payer: Self-pay | Admitting: Internal Medicine

## 2024-06-01 VITALS — BP 100/60 | HR 59 | Ht 69.0 in | Wt 193.8 lb

## 2024-06-01 DIAGNOSIS — Z79899 Other long term (current) drug therapy: Secondary | ICD-10-CM | POA: Diagnosis not present

## 2024-06-01 DIAGNOSIS — I5032 Chronic diastolic (congestive) heart failure: Secondary | ICD-10-CM | POA: Diagnosis not present

## 2024-06-01 DIAGNOSIS — N1831 Chronic kidney disease, stage 3a: Secondary | ICD-10-CM | POA: Insufficient documentation

## 2024-06-01 MED ORDER — DAPAGLIFLOZIN PROPANEDIOL 10 MG PO TABS: 10.0000 mg | ORAL_TABLET | Freq: Every day | ORAL | 6 refills | Status: DC

## 2024-06-01 NOTE — Patient Instructions (Addendum)
 Medication Instructions:   Begin Farxiga  10mg  daily  Please call the office with your list of medications.  Continue all other medications.     Labwork:  BMET - order given today  Please do in 5 days (around 06/07/24) Office will contact with results via phone, letter or mychart.     Testing/Procedures:  none  Follow-Up:  Your physician wants you to follow up in:  1 year.  You should receive a recall letter in the mail about 2 months prior to the time you are due.  If you don't receive this, please call our office to schedule your follow up appointment.       Any Other Special Instructions Will Be Listed Below (If Applicable).   If you need a refill on your cardiac medications before your next appointment, please call your pharmacy.

## 2024-06-01 NOTE — Assessment & Plan Note (Signed)
 Needs referral to nephrology will address at next visit. Pt to avoid all nephrotoxic drugs a

## 2024-06-01 NOTE — Assessment & Plan Note (Signed)
 Diabetes associated with hypertension, hyperlipidemia, CKD, and depression  Sophia [name] is reminded of the importance of commitment to daily physical activity for 30 minutes or more, as able and the need to limit carbohydrate intake to 30 to 60 grams per meal to help with blood sugar control.   The need to take medication as prescribed, test blood sugar as directed, and to call between visits if there is a concern that blood sugar is uncontrolled is also discussed.   Sophia [name] is reminded of the importance of daily foot exam, annual eye examination, and good blood sugar, blood pressure and cholesterol control.     Latest Ref Rng & Units 05/27/2024    9:32 AM 05/03/2024   11:42 AM 04/30/2024    4:18 PM 04/09/2024   10:19 AM 04/02/2024    3:00 AM  Diabetic Labs  HbA1c 4.8 - 5.6 % 5.8       Micro/Creat Ratio 0 - 29 mg/g creat   10     Chol 100 - 199 mg/dL 882       HDL >60 mg/dL 51       Calc LDL 0 - 99 mg/dL 50       Triglycerides 0 - 149 mg/dL 79       Creatinine 9.42 - 1.00 mg/dL 8.80  9.06   8.95  8.86       05/24/2024    8:54 AM 05/11/2024    2:22 PM 05/03/2024   11:12 AM 04/30/2024    3:19 PM 04/13/2024    2:38 PM 04/03/2024    7:41 AM 04/03/2024    6:10 AM  BP/Weight  Systolic BP 130 108  121 116 106 108  Diastolic BP 67 66  65 68 54 53  Wt. (Lbs) 197.8 195 195.99 194.12 195    BMI 29.21 kg/m2 28.8 kg/m2 28.94 kg/m2 28.67 kg/m2 28.8 kg/m2        01/30/2024    1:00 PM 11/14/2022    9:00 AM  Foot/eye exam completion dates  Foot Form Completion Done Done      Updated lab needed at/ before next visit.

## 2024-06-01 NOTE — Assessment & Plan Note (Signed)
 Hyperlipidemia:Low fat diet discussed and encouraged.   Lipid Panel  Lab Results  Component Value Date   CHOL 117 05/27/2024   HDL 51 05/27/2024   LDLCALC 50 05/27/2024   TRIG 79 05/27/2024   CHOLHDL 2.3 05/27/2024     Controlled, no change in medication

## 2024-06-01 NOTE — Assessment & Plan Note (Signed)
 Increased due to recent loss of spouse, doing well overall, no desire or need fo therapy currently, great support from family and friends, no med change

## 2024-06-01 NOTE — Assessment & Plan Note (Signed)
 Stable and doing well especially in light of recent unexpected loss of spouse, no med change

## 2024-06-01 NOTE — Progress Notes (Signed)
 Cardiology Office Note  Date: 06/01/2024   ID: Sophia [name], DOB 1951-08-31, MRN [id number]  PCP:  Antonetta Rollene BRAVO, MD  Cardiologist:  Diannah SHAUNNA Maywood, MD Electrophysiologist:  None   History of Present Illness: Sophia [name] is a 73 y.o. female known to have HTN, DM 2, HLD is here for follow-up visit.  Echocardiogram in April 25 showed normal LVEF, G1 DD with elevated LA pressure, normal RV function, no valvular heart disease and CVP 3 mmHg. NM stress test in April 2025 showed no evidence of ischemia.  CT cardiac coronary calcium  scoring test was performed in 2022 that showed coronary calcium  score of 89, 75th percentile for age and sex matched control.  PFTs from February 2025 showed moderate airway obstruction and concurrent restrictive process.  Patient recently underwent robotic assisted right lower lobe wedge resection, lymph node dissection and intercostal nerve block 3 through 10 in May 2025.  Prior to that, she was sent to the cardiology clinic for preop cardiac stratification due to DOE.  She underwent stress test and echocardiogram which were unremarkable, as stated above.  She is here today for follow-up visit.  She lost her husband at the end of April 2025.  Tearful today.  DOE improving.  BNP within normal limits around 2 months ago.  No angina, dizziness, palpitations, leg swelling.  Past Medical History:  Diagnosis Date   Acute respiratory disease due to COVID-19 virus 08/28/2019   Anxiety    Arthritis    Depression    Diabetes mellitus type 2 in obese 11/28/2010   Qualifier: Diagnosis of  By: Antonetta MD, Margaret  Diet controlled in 12/2016    Diabetes mellitus without complication (HCC)    GERD (gastroesophageal reflux disease)    Hypercholesteremia    Hypertension    PONV (postoperative nausea and vomiting)    Sleep apnea     Past Surgical History:  Procedure Laterality Date   ABDOMINAL HYSTERECTOMY  11/18/1980   tubes and womb, bleeding and ectopic    ABDOMINAL SURGERY     removal of tumors   BALLOON DILATION N/A 02/16/2021   Procedure: BALLOON DILATION;  Surgeon: Cindie Carlin POUR, DO;  Location: [facility];  Service: Endoscopy;  Laterality: N/A;   BIOPSY  02/16/2021   Procedure: BIOPSY;  Surgeon: Cindie Carlin POUR, DO;  Location: [facility];  Service: Endoscopy;;   BREAST SURGERY Left 11/18/1981   benign tumor   CATARACT EXTRACTION W/PHACO Right 01/11/2014   Procedure: CATARACT EXTRACTION PHACO AND INTRAOCULAR LENS PLACEMENT (IOC);  Surgeon: Oneil T. Roz, MD;  Location: [facility];  Service: Ophthalmology;  Laterality: Right;  CDE 5.57   CATARACT EXTRACTION W/PHACO Left 01/25/2014   Procedure: CATARACT EXTRACTION PHACO AND INTRAOCULAR LENS PLACEMENT (IOC);  Surgeon: Oneil T. Roz, MD;  Location: [facility];  Service: Ophthalmology;  Laterality: Left;  CDE:5.19   COLONOSCOPY  2007 [facility]   NL EXAM   COLONOSCOPY N/A 04/22/2014   Dr. Fields:Normal mucosa in the terminal ileum/Two COLON polyps REMOVED/ Mild diverticulosis in the ascending colon and transverse colon/The LEFT colon IS redundant/Small internal hemorrhoids. Path: tubular adenoma. Next colonoscopy in 5-10 years   COLONOSCOPY WITH PROPOFOL  N/A 11/27/2020   Surgeon: Cindie Carlin POUR, DO;  Nonbleeding internal hemorrhoids, one 6 mm transverse colon polyp resected, diverticulosis in the sigmoid colon.  Pathology with tubular adenoma.  Repeat in 5 years.   ESOPHAGOGASTRODUODENOSCOPY N/A 04/22/2014   Dr. Fields:MICROCYTIC ANEMIA MOST LIKELY DUE TO ASA/VOLTAREN /Small hiatal hernia/MODERATE Non-erosive gastritis. Negative  H.pylori   ESOPHAGOGASTRODUODENOSCOPY (EGD) WITH PROPOFOL  N/A 02/16/2021   Surgeon: Cindie Carlin POUR, DO; benign-appearing esophageal stenosis s/p dilation, gastritis biopsied.  Biopsies were benign.   INTERCOSTAL NERVE BLOCK Right 03/31/2024   Procedure: BLOCK, NERVE, INTERCOSTAL;  Surgeon: Kerrin Elspeth BROCKS, MD;  Location: [facility] OR;  Service: Thoracic;   Laterality: Right;   LEFT HEART CATH AND CORONARY ANGIOGRAPHY N/A 06/23/2018   Procedure: LEFT HEART CATH AND CORONARY ANGIOGRAPHY;  Surgeon: Dann Candyce RAMAN, MD;  Location: [facility] INVASIVE CV LAB;  Service: Cardiovascular;  Laterality: N/A;   LYMPH NODE BIOPSY Right 03/31/2024   Procedure: LYMPH NODE BIOPSY;  Surgeon: Kerrin Elspeth BROCKS, MD;  Location: [facility] OR;  Service: Thoracic;  Laterality: Right;   POLYPECTOMY  11/27/2020   Procedure: POLYPECTOMY;  Surgeon: Cindie Carlin POUR, DO;  [facility];  Service: Endoscopy;;   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   ULTRASOUND GUIDANCE FOR VASCULAR ACCESS  06/23/2018   Procedure: Ultrasound Guidance For Vascular Access;  Surgeon: Dann Candyce RAMAN, MD;  Location: [facility] INVASIVE CV LAB;  Service: Cardiovascular;;   UPPER GASTROINTESTINAL ENDOSCOPY  2007 CHEST PAIN   NL EXAM   WEDGE RESECTION, LUNG, ROBOT-ASSISTED, THORACOSCOPIC  03/31/2024   Procedure: WEDGE RESECTION, LUNG, ROBOT-ASSISTED, THORACOSCOPIC;  Surgeon: Kerrin Elspeth BROCKS, MD;  Location: [facility] OR;  Service: Thoracic;;   YAG LASER APPLICATION Right 07/12/2014   Procedure: YAG LASER APPLICATION;  Surgeon: Oneil T. Roz, MD;  Location: [facility];  Service: Ophthalmology;  Laterality: Right;   YAG LASER APPLICATION Left 08/15/2015   Procedure: YAG LASER APPLICATION;  Surgeon: Oneil Roz, MD;  Location: [facility];  Service: Ophthalmology;  Laterality: Left;    Current Outpatient Medications  Medication Sig Dispense Refill   acetaminophen  (TYLENOL ) 650 MG CR tablet Take 650 mg by mouth 2 (two) times daily.     albuterol  (VENTOLIN  HFA) 108 (90 Base) MCG/ACT inhaler Inhale 2 puffs into the lungs every 6 (six) hours as needed for wheezing or shortness of breath. 8 g 0   amLODipine  (NORVASC ) 10 MG tablet TAKE ONE TABLET BY MOUTH ONCE DAILY. 90 tablet 3   ASPIRIN  EC PO Take 325 mg by mouth daily. Swallow whole.     benazepril -hydrochlorthiazide (LOTENSIN  HCT) 20-12.5 MG  tablet TAKE 2 TABLETS EVERY DAY 180 tablet 3   busPIRone  (BUSPAR ) 30 MG tablet Take one tablet by mouth two times daily 60 tablet 3   chlorthalidone  (HYGROTON ) 25 MG tablet TAKE 1 TABLET EVERY DAY 90 tablet 3   Cyanocobalamin  (VITAMIN B-12 PO) 1 tablet. Take 2 gummies daily.     ferrous gluconate  (FERGON) 324 MG tablet Take 324 mg by mouth daily with breakfast.     FLUoxetine  (PROZAC ) 40 MG capsule Take 1 capsule (40 mg total) by mouth daily. 90 capsule 3   gabapentin  (NEURONTIN ) 300 MG capsule Take 1 capsule (300 mg total) by mouth 2 (two) times daily. 60 capsule 3   Melatonin 1 MG CHEW Chew 2 mg by mouth at bedtime.     Multiple Vitamin (MULTIVITAMIN) tablet Take 1 tablet by mouth daily.     nicotine  (NICODERM CQ  - DOSED IN MG/24 HR) 7 mg/24hr patch Place 1 patch (7 mg total) onto the skin daily. 28 patch 0   olmesartan -hydrochlorothiazide  (BENICAR  HCT) 20-12.5 MG tablet TAKE ONE TABLET BY MOUTH EVERY DAY 30 tablet 11   omeprazole  (PRILOSEC) 40 MG capsule TAKE 1 CAPSULE TWICE DAILY FOR 2 MONTH ONLY 180 capsule 3   oxyCODONE  (OXY  IR/ROXICODONE ) 5 MG immediate release tablet Take 1 tablet (5 mg total) by mouth every 6 (six) hours as needed for moderate pain (pain score 4-6). 30 tablet 0   polyethylene glycol (MIRALAX / GLYCOLAX) 17 g packet Take 17 g by mouth daily as needed for moderate constipation.     rosuvastatin  (CRESTOR ) 20 MG tablet TAKE 1 TABLET EVERY DAY 90 tablet 3   sucralfate  (CARAFATE ) 1 g tablet TAKE 1 TABLET TWICE DAILY 180 tablet 3   traZODone  (DESYREL ) 50 MG tablet Take 25-50 mg by mouth at bedtime as needed.     No current facility-administered medications for this visit.   Allergies:  Patient has no known allergies.   Social History: The patient  reports that she quit smoking about 34 years ago. Her smoking use included cigarettes. She started smoking about 54 years ago. She has a 5 pack-year smoking history. She quit smokeless tobacco use about 2 months ago.  Her smokeless  tobacco use included snuff. She reports that she does not currently use drugs after having used the following drugs: Marijuana. She reports that she does not drink alcohol.   Family History: The patient's family history includes Arthritis in her father; CAD in her paternal grandmother; Cancer in her father; Colon polyps (age of onset: 46) in her sister; Diabetes in her sister and sister; Hypertension in her father, sister, and sister; Kidney disease in her sister.   ROS:  Please see the history of present illness. Otherwise, complete review of systems is positive for none  All other systems are reviewed and negative.   Physical Exam: VS:  There were no vitals taken for this visit., BMI There is no height or weight on file to calculate BMI.  Wt Readings from Last 3 Encounters:  05/24/24 197 lb 12.8 oz (89.7 kg)  05/11/24 195 lb (88.5 kg)  05/03/24 195 lb 15.8 oz (88.9 kg)    General: Patient appears comfortable at rest. HEENT: Conjunctiva and lids normal, oropharynx clear with moist mucosa. Neck: Supple, no elevated JVP or carotid bruits, no thyromegaly. Lungs: Clear to auscultation, nonlabored breathing at rest. Cardiac: Regular rate and rhythm, no S3 or significant systolic murmur, no pericardial rub. Abdomen: Soft, nontender, no hepatomegaly, bowel sounds present, no guarding or rebound. Extremities: No pitting edema, distal pulses 2+. Skin: Warm and dry. Musculoskeletal: No kyphosis. Neuropsychiatric: Alert and oriented x3, affect grossly appropriate.  Recent Labwork: 02/06/2024: TSH 2.320 03/09/2024: BNP 11.6 05/03/2024: Hemoglobin 10.6; Platelets 215 05/27/2024: ALT 17; AST 15; BUN 33; Creatinine, Ser 1.19; Potassium 4.1; Sodium 143     Component Value Date/Time   CHOL 117 05/27/2024 0932   TRIG 79 05/27/2024 0932   HDL 51 05/27/2024 0932   CHOLHDL 2.3 05/27/2024 0932   CHOLHDL 2.8 09/01/2020 0941   VLDL 53 (H) 06/23/2018 0352   LDLCALC 50 05/27/2024 0932   LDLCALC 97  09/01/2020 0941     Assessment and Plan:  Chronic diastolic heart failure: Compensated.  DOE improving.  Echocardiogram in April 25 showed normal LVEF, G1 DD with elevated left atrial pressure, normal RV function, no valvular heart disease, CVP 3 mmHg.  Start Farxiga  10 mg once daily and obtain BMP in 5 days. LHC in 2019 showed angiographically normal coronaries.  HTN, controlled: Currently on multiple antihypertensive medications and some duplicates.  Patient instructed to call the clinic and update us  with the list.      Medication Adjustments/Labs and Tests Ordered: Current medicines are reviewed at length with the patient  today.  Concerns regarding medicines are outlined above.    Disposition:  Follow up 1 year  Signed Sohrab Keelan Priya Edvardo Honse, MD, 06/01/2024 10:30 AM    [facility] [address] [place], [place], [place] [address]

## 2024-06-01 NOTE — Progress Notes (Signed)
 Sophia [name]     MRN: [id number]      DOB: 11-22-1950  Chief Complaint  Patient presents with   Medical Management of Chronic Issues    7 week follow up     HPI Sophia [name] is here for follow up and re-evaluation of chronic medical conditions, medication management and review of any available recent lab and radiology data.  Preventive health is updated, specifically  Cancer screening and Immunization.   Questions or concerns regarding consultations or procedures which the PT has had in the interim are  addressed. The PT denies any adverse reactions to current medications since the last visit.  Has had surgery for small cell lung cancer since last visit and has recovered well, lesion small and margins clear , has oncology appt upcoming Lost her spouse who was dear to her since last visit so this has been an additional challenge and struggle , managing with support from family and friends Denies polyuria, polydipsia, blurred vision , or hypoglycemic episodes.   ROS Denies recent fever or chills. Denies sinus pressure, nasal congestion, ear pain or sore throat. Denies chest congestion, productive cough or wheezing. Denies chest pains, palpitations and leg swelling Denies abdominal pain, nausea, vomiting,diarrhea or constipation.   Denies dysuria, frequency, hesitancy or incontinence. Denies uncontrolled joint pain, swelling and limitation in mobility. Denies headaches, seizures, numbness, or tingling. nia. Denies skin break down or rash.   PE  BP 121/65   Pulse 73   Resp 16   Ht 5' 9 (1.753 m)   Wt 194 lb 1.9 oz (88.1 kg)   SpO2 93%   BMI 28.67 kg/m   Patient alert and oriented and in no cardiopulmonary distress.  HEENT: No facial asymmetry, EOMI,     Neck supple .  Chest: Clear to auscultation bilaterally.  CVS: S1, S2 no murmurs, no S3.Regular rate.  ABD: Soft non tender.   Ext: No edema  MS: Adequate ROM spine, shoulders, hips and knees.  Skin: Intact, no  ulcerations or rash noted.  Psych: Good eye contact, normal affect. Memory intact not anxious at times tearful and  depressed appearing.  CNS: CN 2-12 intact, power,  normal throughout.no focal deficits noted.   Assessment & Plan  Essential hypertension Controlled, no change in medication   Hyperlipidemia with target LDL less than 100 Hyperlipidemia:Low fat diet discussed and encouraged.   Lipid Panel  Lab Results  Component Value Date   CHOL 117 05/27/2024   HDL 51 05/27/2024   LDLCALC 50 05/27/2024   TRIG 79 05/27/2024   CHOLHDL 2.3 05/27/2024     Controlled, no change in medication   Type 2 diabetes mellitus with other specified complication (HCC) Diabetes associated with hypertension, hyperlipidemia, CKD, and depression  Sophia [name] is reminded of the importance of commitment to daily physical activity for 30 minutes or more, as able and the need to limit carbohydrate intake to 30 to 60 grams per meal to help with blood sugar control.   The need to take medication as prescribed, test blood sugar as directed, and to call between visits if there is a concern that blood sugar is uncontrolled is also discussed.   Sophia [name] is reminded of the importance of daily foot exam, annual eye examination, and good blood sugar, blood pressure and cholesterol control.     Latest Ref Rng & Units 05/27/2024    9:32 AM 05/03/2024   11:42 AM 04/30/2024    4:18 PM 04/09/2024   10:19 AM  04/02/2024    3:00 AM  Diabetic Labs  HbA1c 4.8 - 5.6 % 5.8       Micro/Creat Ratio 0 - 29 mg/g creat   10     Chol 100 - 199 mg/dL 882       HDL >60 mg/dL 51       Calc LDL 0 - 99 mg/dL 50       Triglycerides 0 - 149 mg/dL 79       Creatinine 9.42 - 1.00 mg/dL 8.80  9.06   8.95  8.86       05/24/2024    8:54 AM 05/11/2024    2:22 PM 05/03/2024   11:12 AM 04/30/2024    3:19 PM 04/13/2024    2:38 PM 04/03/2024    7:41 AM 04/03/2024    6:10 AM  BP/Weight  Systolic BP 130 108  121 116 106 108   Diastolic BP 67 66  65 68 54 53  Wt. (Lbs) 197.8 195 195.99 194.12 195    BMI 29.21 kg/m2 28.8 kg/m2 28.94 kg/m2 28.67 kg/m2 28.8 kg/m2        01/30/2024    1:00 PM 11/14/2022    9:00 AM  Foot/eye exam completion dates  Foot Form Completion Done Done      Updated lab needed at/ before next visit.   GAD (generalized anxiety disorder) Stable and doing well especially in light of recent unexpected loss of spouse, no med change  Depression, major, single episode, in partial remission (HCC) Increased due to recent loss of spouse, doing well overall, no desire or need fo therapy currently, great support from family and friends, no med change  CKD stage 3a, GFR 45-59 ml/min (HCC) Needs referral to nephrology will address at next visit. Pt to avoid all nephrotoxic drugs a

## 2024-06-01 NOTE — Assessment & Plan Note (Signed)
 Controlled, no change in medication

## 2024-06-03 DIAGNOSIS — Z961 Presence of intraocular lens: Secondary | ICD-10-CM | POA: Diagnosis not present

## 2024-06-04 ENCOUNTER — Ambulatory Visit (INDEPENDENT_AMBULATORY_CARE_PROVIDER_SITE_OTHER): Payer: Self-pay | Admitting: Family Medicine

## 2024-06-04 ENCOUNTER — Encounter: Payer: Self-pay | Admitting: Family Medicine

## 2024-06-04 VITALS — BP 98/60 | HR 66 | Resp 16 | Ht 69.0 in | Wt 191.1 lb

## 2024-06-04 DIAGNOSIS — F4321 Adjustment disorder with depressed mood: Secondary | ICD-10-CM | POA: Diagnosis not present

## 2024-06-04 DIAGNOSIS — I10 Essential (primary) hypertension: Secondary | ICD-10-CM

## 2024-06-04 DIAGNOSIS — M25551 Pain in right hip: Secondary | ICD-10-CM

## 2024-06-04 DIAGNOSIS — F5102 Adjustment insomnia: Secondary | ICD-10-CM

## 2024-06-04 MED ORDER — KETOROLAC TROMETHAMINE 60 MG/2ML IM SOLN
60.0000 mg | Freq: Once | INTRAMUSCULAR | Status: AC
  Administered 2024-06-04: 60 mg via INTRAMUSCULAR

## 2024-06-04 MED ORDER — METHYLPREDNISOLONE ACETATE 80 MG/ML IJ SUSP
80.0000 mg | Freq: Once | INTRAMUSCULAR | Status: AC
  Administered 2024-06-04: 80 mg via INTRAMUSCULAR

## 2024-06-04 MED ORDER — PREDNISONE 10 MG PO TABS: 10.0000 mg | ORAL_TABLET | Freq: Two times a day (BID) | ORAL | 0 refills | Status: DC

## 2024-06-04 MED ORDER — SUCRALFATE 1 G PO TABS: 1.0000 g | ORAL_TABLET | Freq: Two times a day (BID) | ORAL | 3 refills | Status: DC

## 2024-06-04 MED ORDER — ROSUVASTATIN CALCIUM 20 MG PO TABS: 20.0000 mg | ORAL_TABLET | Freq: Every day | ORAL | 3 refills | Status: DC

## 2024-06-04 MED ORDER — CHLORTHALIDONE 25 MG PO TABS: 25.0000 mg | ORAL_TABLET | Freq: Every day | ORAL | 3 refills | Status: DC

## 2024-06-04 NOTE — Assessment & Plan Note (Signed)
Uncontrolled.Toradol and depo medrol administered IM in the office , to be followed by a short course of oral prednisone   

## 2024-06-04 NOTE — Assessment & Plan Note (Addendum)
 Over corrected Stop benazepril /hydrochlorothiazide  Re eval in 5 weeks

## 2024-06-04 NOTE — Patient Instructions (Addendum)
 Follow-up with MD in 5 weeks to reevaluate blood pressure and grief.  Stop benazepril  HCTZ your blood pressure is too low.  The 3 medications you have for blood pressure are amlodipine  olmesartan  HCTZ and chlorthalidone .  Commit to taking trazodone  at night for sleep.  Please do join some group grief sessions in the community as we discussed.  I will reach out to your therapist to get individual counseling sessions done once more.  Give yourself time and grace as you go through this process.  It is difficult.  Toradol  60 mg and Depo-Medrol  80 g IM in office today and a 5-day course of prednisone  is sent to your pharmacy for right hip pain.  Thanks for choosing [facility], we consider it a privelige to serve you.

## 2024-06-06 DIAGNOSIS — F4321 Adjustment disorder with depressed mood: Secondary | ICD-10-CM | POA: Insufficient documentation

## 2024-06-06 NOTE — Progress Notes (Signed)
   Sophia [name]     MRN: [id number]      DOB: 02/11/1951  Chief Complaint  Patient presents with   Diabetes    6 week follow up     HPI Sophia [name] is here for follow up and re-evaluation of chronic medical conditions, medication management and review of any available recent lab and radiology data.  Preventive health is updated, specifically  Cancer screening and Immunization.   Concerned that she is crying excessively and feels heart broken. Benefited from individual therapy and wants to resume and will start group therapy, her niece who is a great support is with her and will accompany her  The PT denies any adverse reactions to current medications since the last visit.  There are no new concerns.  There are no specific complaints   ROS Denies recent fever or chills. Denies sinus pressure, nasal congestion, ear pain or sore throat. Denies chest congestion, productive cough or wheezing. Denies chest pains, palpitations and leg swelling Denies abdominal pain, nausea, vomiting,diarrhea or constipation.   Denies dysuria, frequency, hesitancy or incontinence. Uncontrolled right hip pain with  limitation in mobility x 2 weeks Denies headaches, seizures, numbness, or tingling. . Denies skin break down or rash.   PE  BP 98/60   Pulse 66   Resp 16   Ht 5' 9 (1.753 m)   Wt 191 lb 1.9 oz (86.7 kg)   SpO2 94%   BMI 28.22 kg/m   Patient alert and oriented and in no cardiopulmonary distress.tearful and crying  HEENT: No facial asymmetry, EOMI,     Neck supple .  Chest: Clear to auscultation bilaterally.  CVS: S1, S2 no murmurs, no S3.Regular rate.  ABD: Soft non tender.   Ext: No edema  MS: decreased ROM lumbar spine and right hip Skin: Intact, no ulcerations or rash noted.  Psych: Good eye contact, normal affect. Memory intact  anxious and  depressed appearing.  CNS: CN 2-12 intact, power,  normal throughout.no focal deficits noted.   Assessment & Plan  Right hip  pain Uncontrolled.Toradol  and depo medrol  administered IM in the office , to be followed by a short course of oral prednisone    Essential hypertension Over corrected Stop benazepril /hydrochlorothiazide  Re eval in 5 weeks  Grief Appropriate but requires more individual therapy and also needs to start group sessions  Insomnia Uncontrolled , needs to take trazodone  regularly as prescribed

## 2024-06-06 NOTE — Assessment & Plan Note (Signed)
 Uncontrolled , needs to take trazodone  regularly as prescribed

## 2024-06-06 NOTE — Assessment & Plan Note (Signed)
 Appropriate but requires more individual therapy and also needs to start group sessions

## 2024-06-07 ENCOUNTER — Telehealth: Payer: Self-pay | Admitting: Internal Medicine

## 2024-06-07 ENCOUNTER — Telehealth: Payer: Self-pay | Admitting: Pharmacy Technician

## 2024-06-07 ENCOUNTER — Other Ambulatory Visit: Payer: Self-pay | Admitting: Family Medicine

## 2024-06-07 ENCOUNTER — Other Ambulatory Visit (HOSPITAL_COMMUNITY): Payer: Self-pay

## 2024-06-07 NOTE — Telephone Encounter (Signed)
 Per test claim: farxiga  10mg  is 296.77 for 30 days  Patient Advocate Encounter   The patient was approved for a [employer] grant that will help cover the cost of farxiga  Total amount awarded, farxiga .  Effective: 05/08/2024 - 05/07/25   APW:[id number] ERW:[id number] Hmnle:[id number] PI:[id number] [employer] ID: [id number]   Pharmacy provided with approval and processing information. Patient informed via mychart

## 2024-06-07 NOTE — Telephone Encounter (Signed)
 Patient niece informed and verbalized understanding of plan. Advised her to have labs done about 1 week after starting medication.

## 2024-06-07 NOTE — Telephone Encounter (Signed)
 Pt c/o medication issue:  1. Name of Medication: Dapagliflozin  Propanediol 10 mg Oral Daily   2. How are you currently taking this medication (dosage and times per day)? N/A  3. Are you having a reaction (difficulty breathing--STAT)? No   4. What is your medication issue? Pt niece states the pt can't afford this medication and would like to know if the pt would still need to have blood work. Please advise

## 2024-06-08 ENCOUNTER — Telehealth: Payer: Self-pay | Admitting: Family Medicine

## 2024-06-08 NOTE — Telephone Encounter (Signed)
 LVM for patient to call back and schedule appointment with [facility]

## 2024-06-13 ENCOUNTER — Emergency Department (HOSPITAL_COMMUNITY)

## 2024-06-13 ENCOUNTER — Other Ambulatory Visit: Payer: Self-pay

## 2024-06-13 ENCOUNTER — Encounter (HOSPITAL_COMMUNITY): Payer: Self-pay | Admitting: Family Medicine

## 2024-06-13 ENCOUNTER — Inpatient Hospital Stay (HOSPITAL_COMMUNITY)
Admission: EM | Admit: 2024-06-13 | Discharge: 2024-06-17 | DRG: 871 | Disposition: A | Discharge status: Home Health | Attending: Internal Medicine | Admitting: Internal Medicine

## 2024-06-13 DIAGNOSIS — S0990XA Unspecified injury of head, initial encounter: Secondary | ICD-10-CM | POA: Diagnosis not present

## 2024-06-13 DIAGNOSIS — Z87891 Personal history of nicotine dependence: Secondary | ICD-10-CM | POA: Diagnosis not present

## 2024-06-13 DIAGNOSIS — M47811 Spondylosis without myelopathy or radiculopathy, occipito-atlanto-axial region: Secondary | ICD-10-CM | POA: Diagnosis not present

## 2024-06-13 DIAGNOSIS — Z9841 Cataract extraction status, right eye: Secondary | ICD-10-CM

## 2024-06-13 DIAGNOSIS — A4151 Sepsis due to Escherichia coli [E. coli]: Principal | ICD-10-CM | POA: Diagnosis present

## 2024-06-13 DIAGNOSIS — R6521 Severe sepsis with septic shock: Secondary | ICD-10-CM

## 2024-06-13 DIAGNOSIS — I10 Essential (primary) hypertension: Secondary | ICD-10-CM | POA: Diagnosis present

## 2024-06-13 DIAGNOSIS — N1831 Chronic kidney disease, stage 3a: Secondary | ICD-10-CM | POA: Diagnosis not present

## 2024-06-13 DIAGNOSIS — R319 Hematuria, unspecified: Secondary | ICD-10-CM | POA: Diagnosis present

## 2024-06-13 DIAGNOSIS — E1122 Type 2 diabetes mellitus with diabetic chronic kidney disease: Secondary | ICD-10-CM | POA: Diagnosis not present

## 2024-06-13 DIAGNOSIS — Z1152 Encounter for screening for COVID-19: Secondary | ICD-10-CM

## 2024-06-13 DIAGNOSIS — Z841 Family history of disorders of kidney and ureter: Secondary | ICD-10-CM

## 2024-06-13 DIAGNOSIS — F419 Anxiety disorder, unspecified: Secondary | ICD-10-CM | POA: Diagnosis present

## 2024-06-13 DIAGNOSIS — Z860100 Personal history of colon polyps, unspecified: Secondary | ICD-10-CM

## 2024-06-13 DIAGNOSIS — W19XXXA Unspecified fall, initial encounter: Secondary | ICD-10-CM | POA: Diagnosis present

## 2024-06-13 DIAGNOSIS — Z8249 Family history of ischemic heart disease and other diseases of the circulatory system: Secondary | ICD-10-CM

## 2024-06-13 DIAGNOSIS — I130 Hypertensive heart and chronic kidney disease with heart failure and stage 1 through stage 4 chronic kidney disease, or unspecified chronic kidney disease: Secondary | ICD-10-CM | POA: Diagnosis not present

## 2024-06-13 DIAGNOSIS — A419 Sepsis, unspecified organism: Secondary | ICD-10-CM | POA: Diagnosis not present

## 2024-06-13 DIAGNOSIS — Z85118 Personal history of other malignant neoplasm of bronchus and lung: Secondary | ICD-10-CM | POA: Diagnosis not present

## 2024-06-13 DIAGNOSIS — K219 Gastro-esophageal reflux disease without esophagitis: Secondary | ICD-10-CM | POA: Diagnosis present

## 2024-06-13 DIAGNOSIS — I5032 Chronic diastolic (congestive) heart failure: Secondary | ICD-10-CM | POA: Diagnosis present

## 2024-06-13 DIAGNOSIS — E876 Hypokalemia: Secondary | ICD-10-CM | POA: Diagnosis present

## 2024-06-13 DIAGNOSIS — Z833 Family history of diabetes mellitus: Secondary | ICD-10-CM | POA: Diagnosis not present

## 2024-06-13 DIAGNOSIS — N390 Urinary tract infection, site not specified: Secondary | ICD-10-CM | POA: Diagnosis present

## 2024-06-13 DIAGNOSIS — R531 Weakness: Secondary | ICD-10-CM | POA: Diagnosis not present

## 2024-06-13 DIAGNOSIS — Z7982 Long term (current) use of aspirin: Secondary | ICD-10-CM | POA: Diagnosis not present

## 2024-06-13 DIAGNOSIS — N179 Acute kidney failure, unspecified: Secondary | ICD-10-CM | POA: Diagnosis present

## 2024-06-13 DIAGNOSIS — Z8616 Personal history of COVID-19: Secondary | ICD-10-CM

## 2024-06-13 DIAGNOSIS — S199XXA Unspecified injury of neck, initial encounter: Secondary | ICD-10-CM | POA: Diagnosis not present

## 2024-06-13 DIAGNOSIS — I959 Hypotension, unspecified: Secondary | ICD-10-CM | POA: Diagnosis not present

## 2024-06-13 DIAGNOSIS — Z90710 Acquired absence of both cervix and uterus: Secondary | ICD-10-CM

## 2024-06-13 DIAGNOSIS — Z8261 Family history of arthritis: Secondary | ICD-10-CM

## 2024-06-13 DIAGNOSIS — M1990 Unspecified osteoarthritis, unspecified site: Secondary | ICD-10-CM | POA: Diagnosis present

## 2024-06-13 DIAGNOSIS — G4730 Sleep apnea, unspecified: Secondary | ICD-10-CM | POA: Diagnosis present

## 2024-06-13 DIAGNOSIS — E871 Hypo-osmolality and hyponatremia: Secondary | ICD-10-CM | POA: Diagnosis present

## 2024-06-13 DIAGNOSIS — F32A Depression, unspecified: Secondary | ICD-10-CM | POA: Diagnosis present

## 2024-06-13 DIAGNOSIS — Z961 Presence of intraocular lens: Secondary | ICD-10-CM | POA: Diagnosis present

## 2024-06-13 DIAGNOSIS — Z9842 Cataract extraction status, left eye: Secondary | ICD-10-CM

## 2024-06-13 DIAGNOSIS — E1169 Type 2 diabetes mellitus with other specified complication: Secondary | ICD-10-CM | POA: Diagnosis present

## 2024-06-13 DIAGNOSIS — Z79899 Other long term (current) drug therapy: Secondary | ICD-10-CM

## 2024-06-13 DIAGNOSIS — J90 Pleural effusion, not elsewhere classified: Secondary | ICD-10-CM | POA: Diagnosis not present

## 2024-06-13 DIAGNOSIS — J9811 Atelectasis: Secondary | ICD-10-CM | POA: Diagnosis not present

## 2024-06-13 DIAGNOSIS — M47812 Spondylosis without myelopathy or radiculopathy, cervical region: Secondary | ICD-10-CM | POA: Diagnosis not present

## 2024-06-13 DIAGNOSIS — C3491 Malignant neoplasm of unspecified part of right bronchus or lung: Secondary | ICD-10-CM | POA: Diagnosis present

## 2024-06-13 DIAGNOSIS — R6520 Severe sepsis without septic shock: Secondary | ICD-10-CM | POA: Diagnosis not present

## 2024-06-13 DIAGNOSIS — R911 Solitary pulmonary nodule: Secondary | ICD-10-CM | POA: Diagnosis not present

## 2024-06-13 DIAGNOSIS — Z83719 Family history of colon polyps, unspecified: Secondary | ICD-10-CM

## 2024-06-13 DIAGNOSIS — Z7984 Long term (current) use of oral hypoglycemic drugs: Secondary | ICD-10-CM

## 2024-06-13 DIAGNOSIS — E7800 Pure hypercholesterolemia, unspecified: Secondary | ICD-10-CM | POA: Diagnosis present

## 2024-06-13 DIAGNOSIS — R519 Headache, unspecified: Secondary | ICD-10-CM | POA: Diagnosis not present

## 2024-06-13 HISTORY — DX: Sepsis, unspecified organism: A41.9

## 2024-06-13 LAB — RESP PANEL BY RT-PCR (RSV, FLU A&B, COVID)  RVPGX2
Influenza A by PCR: NEGATIVE
Influenza B by PCR: NEGATIVE
Resp Syncytial Virus by PCR: NEGATIVE
SARS Coronavirus 2 by RT PCR: NEGATIVE

## 2024-06-13 LAB — BASIC METABOLIC PANEL WITH GFR
Anion gap: 16 — ABNORMAL HIGH (ref 5–15)
BUN: 90 mg/dL — ABNORMAL HIGH (ref 8–23)
CO2: 24 mmol/L (ref 22–32)
Calcium: 9.1 mg/dL (ref 8.9–10.3)
Chloride: 93 mmol/L — ABNORMAL LOW (ref 98–111)
Creatinine, Ser: 3.12 mg/dL — ABNORMAL HIGH (ref 0.44–1.00)
GFR, Estimated: 15 mL/min — ABNORMAL LOW (ref >60)
Glucose, Bld: 98 mg/dL (ref 70–99)
Potassium: 3.3 mmol/L — ABNORMAL LOW (ref 3.5–5.1)
Sodium: 133 mmol/L — ABNORMAL LOW (ref 135–145)

## 2024-06-13 LAB — CBC WITH DIFFERENTIAL/PLATELET
Abs Immature Granulocytes: 0.7 K/uL — ABNORMAL HIGH (ref 0.00–0.07)
Basophils Absolute: 0 K/uL (ref 0.0–0.1)
Basophils Relative: 0 %
Eosinophils Absolute: 0 K/uL (ref 0.0–0.5)
Eosinophils Relative: 0 %
HCT: 33 % — ABNORMAL LOW (ref 36.0–46.0)
Hemoglobin: 10.4 g/dL — ABNORMAL LOW (ref 12.0–15.0)
Lymphocytes Relative: 6 %
Lymphs Abs: 1.4 K/uL (ref 0.7–4.0)
MCH: 26.2 pg (ref 26.0–34.0)
MCHC: 31.5 g/dL (ref 30.0–36.0)
MCV: 83.1 fL (ref 80.0–100.0)
Metamyelocytes Relative: 3 %
Monocytes Absolute: 0.9 K/uL (ref 0.1–1.0)
Monocytes Relative: 4 %
Neutro Abs: 19.6 K/uL — ABNORMAL HIGH (ref 1.7–7.7)
Neutrophils Relative %: 87 %
Platelets: 186 K/uL (ref 150–400)
RBC: 3.97 MIL/uL (ref 3.87–5.11)
RDW: 15.2 % (ref 11.5–15.5)
WBC: 22.5 K/uL — ABNORMAL HIGH (ref 4.0–10.5)
nRBC: 0 % (ref 0.0–0.2)

## 2024-06-13 LAB — URINALYSIS, ROUTINE W REFLEX MICROSCOPIC
Bilirubin Urine: NEGATIVE
Glucose, UA: NEGATIVE mg/dL
Ketones, ur: NEGATIVE mg/dL
Nitrite: NEGATIVE
Protein, ur: 100 mg/dL — AB
Specific Gravity, Urine: 1.017 (ref 1.005–1.030)
pH: 5 (ref 5.0–8.0)

## 2024-06-13 LAB — GLUCOSE, CAPILLARY: Glucose-Capillary: 85 mg/dL (ref 70–99)

## 2024-06-13 LAB — LACTIC ACID, PLASMA
Lactic Acid, Venous: 1.7 mmol/L (ref 0.5–1.9)
Lactic Acid, Venous: 5.5 mmol/L (ref 0.5–1.9)
Lactic Acid, Venous: 1.9 mmol/L (ref 0.5–1.9)

## 2024-06-13 LAB — BRAIN NATRIURETIC PEPTIDE: B Natriuretic Peptide: 156 pg/mL — ABNORMAL HIGH (ref 0.0–100.0)

## 2024-06-13 LAB — MAGNESIUM: Magnesium: 1.8 mg/dL (ref 1.7–2.4)

## 2024-06-13 LAB — PROTIME-INR
INR: 1.2 (ref 0.8–1.2)
Prothrombin Time: 15.7 s — ABNORMAL HIGH (ref 11.4–15.2)

## 2024-06-13 MED ORDER — SODIUM CHLORIDE 0.9 % IV SOLN
2.0000 g | Freq: Once | INTRAVENOUS | Status: AC
  Administered 2024-06-13: 2 g via INTRAVENOUS
  Filled 2024-06-13: qty 12.5

## 2024-06-13 MED ORDER — BUSPIRONE HCL 5 MG PO TABS
30.0000 mg | ORAL_TABLET | Freq: Two times a day (BID) | ORAL | Status: DC
Start: 2024-06-13 — End: 2024-06-17
  Administered 2024-06-13 – 2024-06-17 (×8): 30 mg via ORAL
  Filled 2024-06-13: qty 2
  Filled 2024-06-13 (×3): qty 6
  Filled 2024-06-13 (×3): qty 2
  Filled 2024-06-13: qty 6

## 2024-06-13 MED ORDER — SODIUM CHLORIDE 0.9 % IV SOLN: 250.0000 mL | INTRAVENOUS | Status: AC

## 2024-06-13 MED ORDER — LACTATED RINGERS IV BOLUS (SEPSIS): 1000.0000 mL | Freq: Once | INTRAVENOUS | Status: DC

## 2024-06-13 MED ORDER — PANTOPRAZOLE SODIUM 40 MG PO TBEC
40.0000 mg | DELAYED_RELEASE_TABLET | Freq: Every day | ORAL | Status: DC
  Administered 2024-06-14 – 2024-06-17 (×4): 40 mg via ORAL
  Filled 2024-06-13 (×4): qty 1

## 2024-06-13 MED ORDER — ROSUVASTATIN CALCIUM 20 MG PO TABS
20.0000 mg | ORAL_TABLET | Freq: Every day | ORAL | Status: DC
  Administered 2024-06-14 – 2024-06-17 (×4): 20 mg via ORAL
  Filled 2024-06-13 (×4): qty 1

## 2024-06-13 MED ORDER — ENOXAPARIN SODIUM 30 MG/0.3ML IJ SOSY
30.0000 mg | PREFILLED_SYRINGE | INTRAMUSCULAR | Status: DC
  Administered 2024-06-13 – 2024-06-14 (×2): 30 mg via SUBCUTANEOUS
  Filled 2024-06-13 (×2): qty 0.3

## 2024-06-13 MED ORDER — MELATONIN 1 MG PO TABS
2.0000 mg | ORAL_TABLET | Freq: Every day | ORAL | Status: DC
  Filled 2024-06-13: qty 2

## 2024-06-13 MED ORDER — ONDANSETRON HCL 4 MG/2ML IJ SOLN
INTRAMUSCULAR | Status: AC
  Administered 2024-06-13: 4 mg
  Filled 2024-06-13: qty 2

## 2024-06-13 MED ORDER — TRAZODONE HCL 50 MG PO TABS: 50.0000 mg | ORAL_TABLET | Freq: Every evening | ORAL | Status: DC | PRN

## 2024-06-13 MED ORDER — INSULIN ASPART 100 UNIT/ML IJ SOLN: 0.0000 [IU] | Freq: Every day | INTRAMUSCULAR | Status: DC

## 2024-06-13 MED ORDER — ACETAMINOPHEN 325 MG PO TABS
650.0000 mg | ORAL_TABLET | Freq: Four times a day (QID) | ORAL | Status: DC | PRN
  Administered 2024-06-14 – 2024-06-17 (×3): 650 mg via ORAL
  Filled 2024-06-13 (×3): qty 2

## 2024-06-13 MED ORDER — LACTATED RINGERS IV BOLUS (SEPSIS)
500.0000 mL | Freq: Once | INTRAVENOUS | Status: AC
  Administered 2024-06-13: 500 mL via INTRAVENOUS

## 2024-06-13 MED ORDER — INSULIN ASPART 100 UNIT/ML IJ SOLN
0.0000 [IU] | Freq: Three times a day (TID) | INTRAMUSCULAR | Status: DC
  Administered 2024-06-14: 2 [IU] via SUBCUTANEOUS
  Administered 2024-06-15: 3 [IU] via SUBCUTANEOUS
  Administered 2024-06-15 – 2024-06-16 (×2): 2 [IU] via SUBCUTANEOUS

## 2024-06-13 MED ORDER — ACETAMINOPHEN 325 MG PO TABS
650.0000 mg | ORAL_TABLET | Freq: Once | ORAL | Status: AC
  Administered 2024-06-13: 650 mg via ORAL
  Filled 2024-06-13: qty 2

## 2024-06-13 MED ORDER — VASOPRESSIN 20 UNITS/100 ML INFUSION FOR SHOCK
0.0000 [IU]/min | INTRAVENOUS | Status: DC
  Administered 2024-06-13: 0.03 [IU]/min via INTRAVENOUS
  Filled 2024-06-13: qty 100

## 2024-06-13 MED ORDER — LORAZEPAM 2 MG/ML IJ SOLN
0.5000 mg | Freq: Once | INTRAMUSCULAR | Status: AC
Start: 2024-06-13 — End: 2024-06-13
  Administered 2024-06-13: 0.5 mg via INTRAVENOUS
  Filled 2024-06-13: qty 1

## 2024-06-13 MED ORDER — SUCRALFATE 1 G PO TABS
1.0000 g | ORAL_TABLET | Freq: Two times a day (BID) | ORAL | Status: DC
Start: 2024-06-13 — End: 2024-06-17
  Administered 2024-06-13 – 2024-06-17 (×8): 1 g via ORAL
  Filled 2024-06-13 (×8): qty 1

## 2024-06-13 MED ORDER — VANCOMYCIN HCL IN DEXTROSE 1-5 GM/200ML-% IV SOLN
1000.0000 mg | Freq: Once | INTRAVENOUS | Status: AC
  Administered 2024-06-13: 1000 mg via INTRAVENOUS
  Filled 2024-06-13: qty 200

## 2024-06-13 MED ORDER — LACTATED RINGERS IV BOLUS (SEPSIS)
1000.0000 mL | Freq: Once | INTRAVENOUS | Status: AC
  Administered 2024-06-13: 1000 mL via INTRAVENOUS

## 2024-06-13 MED ORDER — ONDANSETRON HCL 4 MG PO TABS: 4.0000 mg | ORAL_TABLET | Freq: Four times a day (QID) | ORAL | Status: DC | PRN

## 2024-06-13 MED ORDER — POTASSIUM CHLORIDE 2 MEQ/ML IV SOLN
INTRAVENOUS | Status: DC
  Filled 2024-06-13: qty 1000

## 2024-06-13 MED ORDER — SODIUM CHLORIDE 0.9 % IV SOLN: 1.0000 g | INTRAVENOUS | Status: DC

## 2024-06-13 MED ORDER — CHLORHEXIDINE GLUCONATE CLOTH 2 % EX PADS
6.0000 | MEDICATED_PAD | Freq: Every day | CUTANEOUS | Status: DC
  Administered 2024-06-13 – 2024-06-15 (×3): 6 via TOPICAL

## 2024-06-13 MED ORDER — MELATONIN 3 MG PO TABS
3.0000 mg | ORAL_TABLET | Freq: Every day | ORAL | Status: DC
  Administered 2024-06-13 – 2024-06-16 (×4): 3 mg via ORAL
  Filled 2024-06-13 (×4): qty 1

## 2024-06-13 MED ORDER — POTASSIUM CHLORIDE IN NACL 20-0.9 MEQ/L-% IV SOLN: INTRAVENOUS | Status: AC

## 2024-06-13 MED ORDER — ONDANSETRON HCL 4 MG/2ML IJ SOLN: 4.0000 mg | Freq: Four times a day (QID) | INTRAMUSCULAR | Status: DC | PRN

## 2024-06-13 MED ORDER — NOREPINEPHRINE 4 MG/250ML-% IV SOLN
INTRAVENOUS | Status: AC
  Administered 2024-06-13: 4 ug/min via INTRAVENOUS
  Filled 2024-06-13: qty 250

## 2024-06-13 MED ORDER — LACTATED RINGERS IV BOLUS
500.0000 mL | Freq: Once | INTRAVENOUS | Status: AC
  Administered 2024-06-13: 500 mL via INTRAVENOUS

## 2024-06-13 MED ORDER — ACETAMINOPHEN 650 MG RE SUPP: 650.0000 mg | Freq: Four times a day (QID) | RECTAL | Status: DC | PRN

## 2024-06-13 MED ORDER — METRONIDAZOLE 500 MG/100ML IV SOLN
500.0000 mg | Freq: Once | INTRAVENOUS | Status: AC
  Administered 2024-06-13: 500 mg via INTRAVENOUS
  Filled 2024-06-13: qty 100

## 2024-06-13 MED ORDER — ASPIRIN 81 MG PO TBEC
81.0000 mg | DELAYED_RELEASE_TABLET | Freq: Every day | ORAL | Status: DC
  Administered 2024-06-14 – 2024-06-17 (×4): 81 mg via ORAL
  Filled 2024-06-13 (×4): qty 1

## 2024-06-13 MED ORDER — VANCOMYCIN HCL IN DEXTROSE 1-5 GM/200ML-% IV SOLN: 1000.0000 mg | INTRAVENOUS | Status: DC

## 2024-06-13 MED ORDER — METHOCARBAMOL 500 MG PO TABS
500.0000 mg | ORAL_TABLET | Freq: Once | ORAL | Status: DC
  Filled 2024-06-13: qty 1

## 2024-06-13 MED ORDER — NOREPINEPHRINE 4 MG/250ML-% IV SOLN
0.0000 ug/min | INTRAVENOUS | Status: DC
  Administered 2024-06-14: 7.013 ug/min via INTRAVENOUS
  Filled 2024-06-13: qty 250

## 2024-06-13 MED ORDER — LACTATED RINGERS IV SOLN: INTRAVENOUS | Status: DC

## 2024-06-13 MED ORDER — SODIUM CHLORIDE 0.9 % IV SOLN: INTRAVENOUS | Status: DC

## 2024-06-13 MED ORDER — SODIUM CHLORIDE 0.9 % IV SOLN: 2.0000 g | INTRAVENOUS | Status: DC

## 2024-06-13 MED ORDER — FLUOXETINE HCL 20 MG PO CAPS
40.0000 mg | ORAL_CAPSULE | Freq: Every day | ORAL | Status: DC
  Administered 2024-06-14 – 2024-06-17 (×4): 40 mg via ORAL
  Filled 2024-06-13 (×4): qty 2

## 2024-06-13 NOTE — Progress Notes (Signed)
 CODE SEPSIS - PHARMACY COMMUNICATION  **Broad Spectrum Antibiotics should be administered within 1 hour of Sepsis diagnosis**  Time Code Sepsis Called/Page Received: 1633  Antibiotics Ordered: cefepime , vancomycin , and metronidazole   Time of 1st antibiotic administration: 1702   Additional action taken by pharmacy: None  If necessary, Name of Provider/Nurse Contacted: None    Damien Napoleon ,PharmD Clinical Pharmacist  06/13/2024  4:37 PM

## 2024-06-13 NOTE — ED Notes (Signed)
 See triage notes. Denies LOC at time of fall when hit head. States has been on Farxiga  for 6 days and is making her have gen weakness and stated her legs were shaking and couldn't stand when she fell yesterday. Denies n/v since hitting head. C/o dizziness that is worse with movement. Pupils perrla and moving all extremities. A/o

## 2024-06-13 NOTE — ED Notes (Signed)
 Pt pushed call bell. Pt restless, c/o stabbing in throat and shob. 02 2L [place] placed for comfort. Pt encouraged to breathe slowly in nose and out mouth. Pt not very compliant. Niece at bedside trying to console. pa aware and meds ordered. Pt also nauseated and dry heaving.

## 2024-06-13 NOTE — ED Provider Notes (Signed)
 [place] [facility]GENC[facility] DEPARTMENT AT [facility] Provider Note   CSN: [id number] Arrival date & time: 06/13/24  1535     Patient presents with: [place]   Sophia [name] is a 73 y.o. female.   73 year old female presents today for concern of fall that occurred last night.  She states that she felt weak in her legs shook before she fell.  Endorses head injury but denies loss of consciousness.  Has had some nausea and vomiting.  Denies dysuria, cough, abdominal pain.  Feels like she has been drinking plenty of fluids.  She states she has a [place] cup that she fills up and drinks at least 2/day.  Is not on a diuretic.  Was recently started on Farxiga .  Has been about 6 days.  No vision change.  The history is provided by the patient. No language interpreter was used.       Prior to Admission medications   Medication Sig Start Date End Date Taking? Authorizing Provider  acetaminophen  (TYLENOL ) 650 MG CR tablet Take 650 mg by mouth 2 (two) times daily.    [provider]  albuterol  (VENTOLIN  HFA) 108 (90 Base) MCG/ACT inhaler Inhale 2 puffs into the lungs every 6 (six) hours as needed for wheezing or shortness of breath. 01/30/24   Antonetta Rollene BRAVO, MD  amLODipine  (NORVASC ) 10 MG tablet TAKE ONE TABLET BY MOUTH ONCE DAILY. 04/22/24   Antonetta Rollene BRAVO, MD  ASPIRIN  EC PO Take 325 mg by mouth daily. Swallow whole.    [provider]  busPIRone  (BUSPAR ) 30 MG tablet Take one tablet by mouth two times daily 05/17/24   Antonetta Rollene BRAVO, MD  chlorthalidone  (HYGROTON ) 25 MG tablet Take 1 tablet (25 mg total) by mouth daily. 06/04/24   Antonetta Rollene BRAVO, MD  Cyanocobalamin  (VITAMIN B-12 PO) 1 tablet. Take 2 gummies daily.    [provider]  dapagliflozin  propanediol (FARXIGA ) 10 MG TABS tablet Take 1 tablet (10 mg total) by mouth daily. 06/01/24   Mallipeddi, Vishnu P, MD  ferrous gluconate  (FERGON) 324 MG tablet Take 324 mg by mouth daily with breakfast.     [provider]  FLUoxetine  (PROZAC ) 40 MG capsule Take 1 capsule (40 mg total) by mouth daily. 04/13/23   Antonetta Rollene BRAVO, MD  gabapentin  (NEURONTIN ) 300 MG capsule Take 1 capsule (300 mg total) by mouth 2 (two) times daily. 04/13/24   Kerrin Elspeth BROCKS, MD  Melatonin 1 MG CHEW Chew 2 mg by mouth at bedtime. 09/23/23   Antonetta Rollene BRAVO, MD  Multiple Vitamin (MULTIVITAMIN) tablet Take 1 tablet by mouth daily.    [provider]  nicotine  (NICODERM CQ  - DOSED IN MG/24 HR) 7 mg/24hr patch Place 1 patch (7 mg total) onto the skin daily. 05/03/24   Kandala, Hyndavi, MD  olmesartan -hydrochlorothiazide  (BENICAR  HCT) 20-12.5 MG tablet TAKE ONE TABLET BY MOUTH EVERY DAY 05/18/24   Wert, Michael B, MD  omeprazole  (PRILOSEC) 40 MG capsule TAKE 1 CAPSULE TWICE DAILY FOR 2 MONTH ONLY 03/08/24   Cindie Carlin POUR, DO  oxyCODONE  (OXY IR/ROXICODONE ) 5 MG immediate release tablet Take 1 tablet (5 mg total) by mouth every 6 (six) hours as needed for moderate pain (pain score 4-6). 04/03/24   Zimmerman, Donielle M, PA-C  polyethylene glycol (MIRALAX  / GLYCOLAX ) 17 g packet Take 17 g by mouth daily as needed for moderate constipation.    [provider]  predniSONE  (DELTASONE ) 10 MG tablet Take 1 tablet (10 mg total) by  mouth 2 (two) times daily with a meal. 06/04/24   Antonetta Rollene BRAVO, MD  rosuvastatin  (CRESTOR ) 20 MG tablet Take 1 tablet (20 mg total) by mouth daily. 06/04/24   Antonetta Rollene BRAVO, MD  sucralfate  (CARAFATE ) 1 g tablet Take 1 tablet (1 g total) by mouth 2 (two) times daily. 06/04/24   Antonetta Rollene BRAVO, MD  traZODone  (DESYREL ) 50 MG tablet Take 0.5-1 tablets (25-50 mg total) by mouth at bedtime as needed for sleep. 06/07/24   Antonetta Rollene BRAVO, MD    Allergies: Patient has no known allergies.    Review of Systems  Constitutional:  Negative for chills and fever.  Respiratory:  Negative for cough and shortness of breath.   Cardiovascular:  Negative for chest pain.   Gastrointestinal:  Positive for nausea (Currently not nauseous) and vomiting. Negative for abdominal pain.  Neurological:  Negative for light-headedness.  All other systems reviewed and are negative.   Updated Vital Signs BP (!) 98/58   Pulse 81   Temp 98.4 F (36.9 C) (Oral)   Resp (!) 21   Ht 5' 9 (1.753 m)   Wt 86 kg   SpO2 92%   BMI 28.00 kg/m   Physical Exam  (all labs ordered are listed, but only abnormal results are displayed) Labs Reviewed  CBC WITH DIFFERENTIAL/PLATELET - Abnormal; Notable for the following components:      Result Value   WBC 22.5 (*)    Hemoglobin 10.4 (*)    HCT 33.0 (*)    Neutro Abs 19.6 (*)    Abs Immature Granulocytes 0.70 (*)    All other components within normal limits  BASIC METABOLIC PANEL WITH GFR - Abnormal; Notable for the following components:   Sodium 133 (*)    Potassium 3.3 (*)    Chloride 93 (*)    BUN 90 (*)    Creatinine, Ser 3.12 (*)    GFR, Estimated 15 (*)    Anion gap 16 (*)    All other components within normal limits  BRAIN NATRIURETIC PEPTIDE - Abnormal; Notable for the following components:   B Natriuretic Peptide 156.0 (*)    All other components within normal limits  URINALYSIS, ROUTINE W REFLEX MICROSCOPIC - Abnormal; Notable for the following components:   APPearance HAZY (*)    Hgb urine dipstick MODERATE (*)    Protein, ur 100 (*)    Leukocytes,Ua MODERATE (*)    Bacteria, UA RARE (*)    All other components within normal limits  PROTIME-INR - Abnormal; Notable for the following components:   Prothrombin Time 15.7 (*)    All other components within normal limits  RESP PANEL BY RT-PCR (RSV, FLU A&B, COVID)  RVPGX2  CULTURE, BLOOD (ROUTINE X 2)  CULTURE, BLOOD (ROUTINE X 2)  MAGNESIUM   LACTIC ACID, PLASMA  LACTIC ACID, PLASMA    EKG: None  Radiology: CT CHEST ABDOMEN PELVIS WO CONTRAST Result Date: 06/13/2024 CLINICAL DATA:  Sepsis EXAM: CT CHEST, ABDOMEN AND PELVIS WITHOUT CONTRAST TECHNIQUE:  Multidetector CT imaging of the chest, abdomen and pelvis was performed following the standard protocol without IV contrast. RADIATION DOSE REDUCTION: This exam was performed according to the departmental dose-optimization program which includes automated exposure control, adjustment of the mA and/or kV according to patient size and/or use of iterative reconstruction technique. COMPARISON:  PET CT 01/09/2024. FINDINGS: CT CHEST FINDINGS Cardiovascular: No significant vascular findings. Normal heart size. No pericardial effusion. There are atherosclerotic calcifications of the aorta. Mediastinum/Nodes: No enlarged  mediastinal, hilar, or axillary lymph nodes. Thyroid  gland, trachea, and esophagus demonstrate no significant findings. Lungs/Pleura: Patient is status post resection of right lower lobe nodule. There is some atelectasis along the staple line with trace right pleural effusion. There is a 2 mm right lower lobe nodule image 4/73 which is not definitely seen on prior PET-CT, although this may be related to differences in technique. Mild emphysema again noted. Musculoskeletal: No chest wall mass or suspicious bone lesions identified. CT ABDOMEN PELVIS FINDINGS Hepatobiliary: No focal liver abnormality is seen. No gallstones, gallbladder wall thickening, or biliary dilatation. Pancreas: Unremarkable. No pancreatic ductal dilatation or surrounding inflammatory changes. Spleen: Normal in size without focal abnormality. Adrenals/Urinary Tract: Adrenal glands are unremarkable. Kidneys are normal, without renal calculi, focal lesion, or hydronephrosis. Bladder is unremarkable. Stomach/Bowel: Stomach is within normal limits. Appendix is not seen. No evidence of bowel wall thickening, distention, or inflammatory changes. Vascular/Lymphatic: Aortic atherosclerosis. No enlarged abdominal or pelvic lymph nodes. Reproductive: Status post hysterectomy. No adnexal masses. Other: No abdominal wall hernia or abnormality. No  abdominopelvic ascites. Musculoskeletal: No acute or significant osseous findings. IMPRESSION: 1. Status post resection of right lower lobe nodule. There is some atelectasis along the staple line with trace right pleural effusion. 2. 2 mm right lower lobe nodule is not definitely seen on prior PET-CT, although this may be related to differences in technique. 3. No acute localizing process in the abdomen or pelvis. Aortic Atherosclerosis (ICD10-I70.0) and Emphysema (ICD10-J43.9). Electronically Signed   By: Greig Pique M.D.   On: 06/13/2024 18:12   CT Cervical Spine Wo Contrast Result Date: 06/13/2024 CLINICAL DATA:  Fall.  Blunt head and neck trauma. EXAM: CT CERVICAL SPINE WITHOUT CONTRAST TECHNIQUE: Multidetector CT imaging of the cervical spine was performed without intravenous contrast. Multiplanar CT image reconstructions were also generated. RADIATION DOSE REDUCTION: This exam was performed according to the departmental dose-optimization program which includes automated exposure control, adjustment of the mA and/or kV according to patient size and/or use of iterative reconstruction technique. COMPARISON:  None Available. FINDINGS: Alignment: Normal. Skull base and vertebrae: No acute fracture. No primary bone lesion or focal pathologic process. Soft tissues and spinal canal: No prevertebral fluid or swelling. No visible canal hematoma. Disc levels: No disc space narrowing. Moderate bilateral facet DJD is seen at C3-4 and C4-5. Atlantoaxial degenerative changes also noted. Upper chest: No acute findings. Other: None. IMPRESSION: No evidence of cervical spine fracture or subluxation. Degenerative cervical spondylosis, as described above. Electronically Signed   By: Norleen DELENA Kil M.D.   On: 06/13/2024 16:53   DG Chest Portable 1 View Result Date: 06/13/2024 CLINICAL DATA:  Recent fall.  Hypotension. EXAM: PORTABLE CHEST 1 VIEW COMPARISON:  05/11/2024 FINDINGS: The heart size and mediastinal contours are  within normal limits. Both lungs are clear. The visualized skeletal structures are unremarkable. IMPRESSION: No active disease. Electronically Signed   By: Norleen DELENA Kil M.D.   On: 06/13/2024 16:50   CT Head Wo Contrast Result Date: 06/13/2024 CLINICAL DATA:  Fall last night with blunt head trauma. Headache and weakness. EXAM: CT HEAD WITHOUT CONTRAST TECHNIQUE: Contiguous axial images were obtained from the base of the skull through the vertex without intravenous contrast. RADIATION DOSE REDUCTION: This exam was performed according to the departmental dose-optimization program which includes automated exposure control, adjustment of the mA and/or kV according to patient size and/or use of iterative reconstruction technique. COMPARISON:  None Available. FINDINGS: Brain: No evidence of intracranial hemorrhage, acute infarction, hydrocephalus, extra-axial collection,  or mass lesion/mass effect. Vascular:  No hyperdense vessel or other acute findings. Skull: No evidence of fracture or other significant bone abnormality. Sinuses/Orbits:  No acute findings. Other: None. IMPRESSION: Negative noncontrast head CT. Electronically Signed   By: Norleen DELENA Kil M.D.   On: 06/13/2024 16:49     .Critical Care  Performed by: Hildegard Loge, PA-C Authorized by: Hildegard Loge, PA-C   Critical care provider statement:    Critical care time (minutes):  30   Critical care was necessary to treat or prevent imminent or life-threatening deterioration of the following conditions:  Sepsis   Critical care was time spent personally by me on the following activities:  Development of treatment plan with patient or surrogate, discussions with consultants, evaluation of patient's response to treatment, examination of patient, ordering and review of laboratory studies, ordering and review of radiographic studies, ordering and performing treatments and interventions, pulse oximetry, re-evaluation of patient's condition and review of old charts    Care discussed with: admitting provider      Medications Ordered in the ED  lactated ringers  infusion (has no administration in time range)  lactated ringers  bolus 500 mL (0 mLs Intravenous Stopped 06/13/24 1701)  ceFEPIme  (MAXIPIME ) 2 g in sodium chloride  0.9 % 100 mL IVPB (0 g Intravenous Stopped 06/13/24 1758)  metroNIDAZOLE  (FLAGYL ) IVPB 500 mg (0 mg Intravenous Stopped 06/13/24 1759)  vancomycin  (VANCOCIN ) IVPB 1000 mg/200 mL premix (0 mg Intravenous Stopped 06/13/24 1802)  lactated ringers  bolus 500 mL (0 mLs Intravenous Stopped 06/13/24 1759)  lactated ringers  bolus 1,000 mL (1,000 mLs Intravenous New Bag/Given 06/13/24 1759)    And  lactated ringers  bolus 1,000 mL (1,000 mLs Intravenous New Bag/Given 06/13/24 1802)                                    Medical Decision Making Amount and/or Complexity of Data Reviewed Labs: ordered. Radiology: ordered.  Risk Prescription drug management.   Medical Decision Making / ED Course   This patient presents to the ED for concern of fall, hypoxia, this involves an extensive number of treatment options, and is a complaint that carries with it a high risk of complications and morbidity.  The differential diagnosis includes intracranial injury, sepsis, pneumonia, UTI  MDM: 73 year old female presents today for concern of fall which occurred last night.  Also endorses weakness.  Hypotensive on arrival. Fluids given. Labs obtained.  Hemodynamically stable otherwise.  No signs of infection based off of symptoms. CBC resulted and showed leukocytosis of 22.5 with left shift.  Sepsis protocol initiated.  Broad-spectrum antibiotics and 30 cc/kg fluids given.  Lactic acid within normal.  BNP mildly elevated but chest x-ray shows no signs of volume overload.  BMP shows creatinine of 3.12 which is above her baseline of 1.  No other acute concern.  UA shows evidence of UTI.  Respiratory panel is negative. Chest x-ray without evidence of pneumonia.  CT  head, C-spine without acute traumatic injury or intracranial bleed. CT chest abdomen pelvis without contrast was obtained prior to UA resulting to look for source of infection.  This did not show any acute concern. Patient was given broad-spectrum antibiotics. Discussed with hospitalist for admission who will evaluate patient.  BP after admission: Improved to 120s/60s. Patient had a slight panic attack.  Have a milligram of Ativan  and Zofran  ordered.   Additional history obtained: -Additional history obtained from family at bedside -External  records from outside source obtained and reviewed including: Chart review including previous notes, labs, imaging, consultation notes   Lab Tests: -I ordered, reviewed, and interpreted labs.   The pertinent results include:   Labs Reviewed  CBC WITH DIFFERENTIAL/PLATELET - Abnormal; Notable for the following components:      Result Value   WBC 22.5 (*)    Hemoglobin 10.4 (*)    HCT 33.0 (*)    Neutro Abs 19.6 (*)    Abs Immature Granulocytes 0.70 (*)    All other components within normal limits  BASIC METABOLIC PANEL WITH GFR - Abnormal; Notable for the following components:   Sodium 133 (*)    Potassium 3.3 (*)    Chloride 93 (*)    BUN 90 (*)    Creatinine, Ser 3.12 (*)    GFR, Estimated 15 (*)    Anion gap 16 (*)    All other components within normal limits  BRAIN NATRIURETIC PEPTIDE - Abnormal; Notable for the following components:   B Natriuretic Peptide 156.0 (*)    All other components within normal limits  URINALYSIS, ROUTINE W REFLEX MICROSCOPIC - Abnormal; Notable for the following components:   APPearance HAZY (*)    Hgb urine dipstick MODERATE (*)    Protein, ur 100 (*)    Leukocytes,Ua MODERATE (*)    Bacteria, UA RARE (*)    All other components within normal limits  PROTIME-INR - Abnormal; Notable for the following components:   Prothrombin Time 15.7 (*)    All other components within normal limits  RESP PANEL BY  RT-PCR (RSV, FLU A&B, COVID)  RVPGX2  CULTURE, BLOOD (ROUTINE X 2)  CULTURE, BLOOD (ROUTINE X 2)  MAGNESIUM   LACTIC ACID, PLASMA  LACTIC ACID, PLASMA      EKG  EKG Interpretation Date/Time:    Ventricular Rate:    PR Interval:    QRS Duration:    QT Interval:    QTC Calculation:   R Axis:      Text Interpretation:           Imaging Studies ordered: I ordered imaging studies including chest x-ray, CT head, CT C-spine, CT chest abdomen pelvis without contrast I independently visualized and interpreted imaging. I agree with the radiologist interpretation   Medicines ordered and prescription drug management: Meds ordered this encounter  Medications   lactated ringers  bolus 500 mL   lactated ringers  infusion   ceFEPIme  (MAXIPIME ) 2 g in sodium chloride  0.9 % 100 mL IVPB    Antibiotic Indication::   Other Indication (list below)    Other Indication::   Unknown Source.   metroNIDAZOLE  (FLAGYL ) IVPB 500 mg    Antibiotic Indication::   Other Indication (list below)    Other Indication::   Unknown Source.   vancomycin  (VANCOCIN ) IVPB 1000 mg/200 mL premix    Indication::   Other Indication (list below)    Other Indication::   Unknown Source.   lactated ringers  bolus 500 mL    Total Body Weight basis for 30 mL/kg  bolus delivery:   86 kg   AND Linked Order Group    lactated ringers  bolus 1,000 mL     Total Body Weight basis for 30 mL/kg  bolus delivery:   86 kg    lactated ringers  bolus 1,000 mL     Total Body Weight basis for 30 mL/kg  bolus delivery:   86 kg   LORazepam  (ATIVAN ) injection 0.5 mg    -I have reviewed  the patients home medicines and have made adjustments as needed  Critical interventions Sepsis, broad-spectrum antibiotics, large-volume fluid resuscitation    Social Determinants of Health:  Factors impacting patients care include: Good family support   Reevaluation: After the interventions noted above, I reevaluated the patient and found that  they have :improved  Co morbidities that complicate the patient evaluation  Past Medical History:  Diagnosis Date   Acute respiratory disease due to COVID-19 virus 08/28/2019   Anxiety    Arthritis    Depression    Diabetes mellitus type 2 in obese 11/28/2010   Qualifier: Diagnosis of  By: Antonetta MD, Margaret  Diet controlled in 12/2016    Diabetes mellitus without complication (HCC)    GERD (gastroesophageal reflux disease)    Hypercholesteremia    Hypertension    PONV (postoperative nausea and vomiting)    Sleep apnea       Dispostion: Discussed with hospitalist will evaluate patient for admission.   Final diagnoses:  Sepsis, due to unspecified organism, unspecified whether acute organ dysfunction present [facility])  Urinary tract infection with hematuria, site unspecified  AKI (acute kidney injury) [facility])    ED Discharge Orders     None          Hildegard Loge, PA-C 06/13/24 1850    Gennaro Duwaine CROME, DO 06/14/24 2003

## 2024-06-13 NOTE — H&P (Signed)
 History and Physical    Patient: Sophia [name] FMW:[id number] DOB: [birth year] DOA: 06/13/2024 DOS: the patient was seen and examined on 06/13/2024 PCP: Antonetta Rollene BRAVO, MD  Patient coming from: Home  Chief Complaint:  Chief Complaint  Patient presents with   Fall   HPI: Sophia [name] is a 73 y.o. female with medical history significant of diabetes hypertension, GERD, sleep apnea, stage III chronic kidney disease.  Patient brought to the hospital due to a fall that occurred last night.  She states that her legs felt weak before she fell.  She did hit her head but had no loss of consciousness.  She had some nausea and vomiting and frequency.  She was recently started on Farxiga  about 6 days ago.  Here, she was noted to have a low blood pressure.  She was started on broad-spectrum antibiotics and code sepsis was called.  She was bolused with IV fluids which helped with her blood pressure.  Her white count was noted to be 22.5 and was noted to have a UTI.  Denies flank pain, nausea, vomiting, shortness of breath.  After the antibiotics, she was noted to have a fever and shaking chills.  Review of Systems: As mentioned in the history of present illness. All other systems reviewed and are negative. Past Medical History:  Diagnosis Date   Acute respiratory disease due to COVID-19 virus 08/28/2019   Anxiety    Arthritis    Depression    Diabetes mellitus type 2 in obese 11/28/2010   Qualifier: Diagnosis of  By: Antonetta MD, Margaret  Diet controlled in 12/2016    Diabetes mellitus without complication (HCC)    GERD (gastroesophageal reflux disease)    Hypercholesteremia    Hypertension    PONV (postoperative nausea and vomiting)    Sleep apnea    Past Surgical History:  Procedure Laterality Date   ABDOMINAL HYSTERECTOMY  11/18/1980   tubes and womb, bleeding and ectopic   ABDOMINAL SURGERY     removal of tumors   BALLOON DILATION N/A 02/16/2021   Procedure: BALLOON DILATION;  Surgeon:  Cindie Carlin POUR, DO;  [facility];  Service: Endoscopy;  Laterality: N/A;   BIOPSY  02/16/2021   Procedure: BIOPSY;  Surgeon: Cindie Carlin POUR, DO;  [facility];  Service: Endoscopy;;   BREAST SURGERY Left 11/18/1981   benign tumor   CATARACT EXTRACTION W/PHACO Right 01/11/2014   Procedure: CATARACT EXTRACTION PHACO AND INTRAOCULAR LENS PLACEMENT (IOC);  Surgeon: Oneil T. Roz, MD;  Location: [facility];  Service: Ophthalmology;  Laterality: Right;  CDE 5.57   CATARACT EXTRACTION W/PHACO Left 01/25/2014   Procedure: CATARACT EXTRACTION PHACO AND INTRAOCULAR LENS PLACEMENT (IOC);  Surgeon: Oneil T. Roz, MD;  Location: [facility];  Service: Ophthalmology;  Laterality: Left;  CDE:5.19   COLONOSCOPY  2007 [facility]   NL EXAM   COLONOSCOPY N/A 04/22/2014   Dr. Fields:Normal mucosa in the terminal ileum/Two COLON polyps REMOVED/ Mild diverticulosis in the ascending colon and transverse colon/The LEFT colon IS redundant/Small internal hemorrhoids. Path: tubular adenoma. Next colonoscopy in 5-10 years   COLONOSCOPY WITH PROPOFOL  N/A 11/27/2020   Surgeon: Cindie Carlin K, DO;  Nonbleeding internal hemorrhoids, one 6 mm transverse colon polyp resected, diverticulosis in the sigmoid colon.  Pathology with tubular adenoma.  Repeat in 5 years.   ESOPHAGOGASTRODUODENOSCOPY N/A 04/22/2014   Dr. Fields:MICROCYTIC ANEMIA MOST LIKELY DUE TO ASA/VOLTAREN /Small hiatal hernia/MODERATE Non-erosive gastritis. Negative H.pylori   ESOPHAGOGASTRODUODENOSCOPY (EGD) WITH PROPOFOL  N/A 02/16/2021  Surgeon: Cindie Carlin POUR, DO; benign-appearing esophageal stenosis s/p dilation, gastritis biopsied.  Biopsies were benign.   INTERCOSTAL NERVE BLOCK Right 03/31/2024   Procedure: BLOCK, NERVE, INTERCOSTAL;  Surgeon: Kerrin Elspeth BROCKS, MD;  Location: [facility] OR;  Service: Thoracic;  Laterality: Right;   LEFT HEART CATH AND CORONARY ANGIOGRAPHY N/A 06/23/2018   Procedure: LEFT HEART CATH AND CORONARY  ANGIOGRAPHY;  Surgeon: Dann Candyce RAMAN, MD;  Location: [facility] INVASIVE CV LAB;  Service: Cardiovascular;  Laterality: N/A;   LYMPH NODE BIOPSY Right 03/31/2024   Procedure: LYMPH NODE BIOPSY;  Surgeon: Kerrin Elspeth BROCKS, MD;  Location: [facility] OR;  Service: Thoracic;  Laterality: Right;   POLYPECTOMY  11/27/2020   Procedure: POLYPECTOMY;  Surgeon: Cindie Carlin POUR, DO;  [facility];  Service: Endoscopy;;   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   ULTRASOUND GUIDANCE FOR VASCULAR ACCESS  06/23/2018   Procedure: Ultrasound Guidance For Vascular Access;  Surgeon: Dann Candyce RAMAN, MD;  Location: [facility] INVASIVE CV LAB;  Service: Cardiovascular;;   UPPER GASTROINTESTINAL ENDOSCOPY  2007 CHEST PAIN   NL EXAM   WEDGE RESECTION, LUNG, ROBOT-ASSISTED, THORACOSCOPIC  03/31/2024   Procedure: WEDGE RESECTION, LUNG, ROBOT-ASSISTED, THORACOSCOPIC;  Surgeon: Kerrin Elspeth BROCKS, MD;  Location: [facility] OR;  Service: Thoracic;;   YAG LASER APPLICATION Right 07/12/2014   Procedure: YAG LASER APPLICATION;  Surgeon: Oneil T. Roz, MD;  Location: [facility];  Service: Ophthalmology;  Laterality: Right;   YAG LASER APPLICATION Left 08/15/2015   Procedure: YAG LASER APPLICATION;  Surgeon: Oneil Roz, MD;  Location: [facility];  Service: Ophthalmology;  Laterality: Left;   Social History:  reports that she quit smoking about 34 years ago. Her smoking use included cigarettes. She started smoking about 54 years ago. She has a 5 pack-year smoking history. She quit smokeless tobacco use about 2 months ago.  Her smokeless tobacco use included snuff. She reports that she does not currently use drugs after having used the following drugs: Marijuana. She reports that she does not drink alcohol.  No Known Allergies  Family History  Problem Relation Age of Onset   Colon polyps Sister 52   Diabetes Sister    Hypertension Sister    Kidney disease Sister    Arthritis Father    Cancer Father        prostate     Hypertension Father    Diabetes Sister    Hypertension Sister    CAD Paternal Grandmother    Colon cancer Neg Hx     Prior to Admission medications   Medication Sig Start Date End Date Taking? Authorizing Provider  acetaminophen  (TYLENOL ) 650 MG CR tablet Take 650 mg by mouth 2 (two) times daily.   Yes [provider]  albuterol  (VENTOLIN  HFA) 108 (90 Base) MCG/ACT inhaler Inhale 2 puffs into the lungs every 6 (six) hours as needed for wheezing or shortness of breath. 01/30/24  Yes Antonetta Rollene BRAVO, MD  ASPIRIN  EC PO Take 325 mg by mouth daily. Swallow whole.   Yes [provider]  busPIRone  (BUSPAR ) 30 MG tablet Take one tablet by mouth two times daily 05/17/24  Yes Antonetta Rollene BRAVO, MD  chlorthalidone  (HYGROTON ) 25 MG tablet Take 1 tablet (25 mg total) by mouth daily. 06/04/24  Yes Antonetta Rollene BRAVO, MD  Cyanocobalamin  (VITAMIN B-12 PO) 1 tablet. Take 2 gummies daily.   Yes [provider]  dapagliflozin  propanediol (FARXIGA ) 10 MG TABS tablet Take 1 tablet (10 mg total) by mouth  daily. 06/01/24  Yes Mallipeddi, Vishnu P, MD  ferrous gluconate  (FERGON) 324 MG tablet Take 324 mg by mouth daily with breakfast.   Yes [provider]  FLUoxetine  (PROZAC ) 40 MG capsule Take 1 capsule (40 mg total) by mouth daily. 04/13/23  Yes Antonetta Rollene BRAVO, MD  gabapentin  (NEURONTIN ) 300 MG capsule Take 1 capsule (300 mg total) by mouth 2 (two) times daily. 04/13/24  Yes Kerrin Elspeth BROCKS, MD  Melatonin 1 MG CHEW Chew 2 mg by mouth at bedtime. 09/23/23  Yes Antonetta Rollene BRAVO, MD  Multiple Vitamin (MULTIVITAMIN) tablet Take 1 tablet by mouth daily.   Yes [provider]  olmesartan -hydrochlorothiazide  (BENICAR  HCT) 20-12.5 MG tablet TAKE ONE TABLET BY MOUTH EVERY DAY 05/18/24  Yes Wert, Michael B, MD  omeprazole  (PRILOSEC) 40 MG capsule TAKE 1 CAPSULE TWICE DAILY FOR 2 MONTH ONLY 03/08/24  Yes Carver, Charles K, DO  polyethylene glycol (MIRALAX  / GLYCOLAX )  17 g packet Take 17 g by mouth daily as needed for moderate constipation.   Yes [provider]  rosuvastatin  (CRESTOR ) 20 MG tablet Take 1 tablet (20 mg total) by mouth daily. 06/04/24  Yes Antonetta Rollene BRAVO, MD  sucralfate  (CARAFATE ) 1 g tablet Take 1 tablet (1 g total) by mouth 2 (two) times daily. 06/04/24  Yes Antonetta Rollene BRAVO, MD  traZODone  (DESYREL ) 50 MG tablet Take 0.5-1 tablets (25-50 mg total) by mouth at bedtime as needed for sleep. 06/07/24  Yes Antonetta Rollene BRAVO, MD    Physical Exam: Vitals:   06/13/24 1850 06/13/24 1852 06/13/24 1857 06/13/24 1858  BP:      Pulse: (!) 114 (!) 115 (!) 119   Resp: 15 (!) 30 18   Temp:    (!) 102.3 F (39.1 C)  TempSrc:    Rectal  SpO2: 96% 90% 97%   Weight:      Height:       General: Elderly female. Awake and alert and oriented x3. No acute cardiopulmonary distress.  HEENT: Normocephalic atraumatic.  Right and left ears normal in appearance.  Pupils equal, round, reactive to light. Extraocular muscles are intact. Sclerae anicteric and noninjected.  Moist mucosal membranes. No mucosal lesions.  Neck: Neck supple without lymphadenopathy. No carotid bruits. No masses palpated.  Cardiovascular: Regular rate with normal S1-S2 sounds. No murmurs, rubs, gallops auscultated. No JVD.  Respiratory: Good respiratory effort with no wheezes, rales, rhonchi. Lungs clear to auscultation bilaterally.  No accessory muscle use. Abdomen: Soft, nontender, nondistended. Active bowel sounds. No masses or hepatosplenomegaly  Skin: No rashes, lesions, or ulcerations.  Dry, warm to touch. 2+ dorsalis pedis and radial pulses. Musculoskeletal: No calf or leg pain. All major joints not erythematous nontender.  No upper or lower joint deformation.  Good ROM.  No contractures  Psychiatric: Intact judgment and insight. Pleasant and cooperative. Neurologic: No focal neurological deficits. Strength is 5/5 and symmetric in upper and lower extremities.  Cranial  nerves II through XII are grossly intact.  Data Reviewed: I reviewed patient labs and imaging  Assessment and Plan: No notes have been filed under this hospital service. Service: Hospitalist  Principal Problem:   Septic shock (HCC) Active Problems:   Essential hypertension   Gastroesophageal reflux disease   Type 2 diabetes mellitus with other specified complication (HCC)   Chronic diastolic heart failure (HCC)   Non-small cell carcinoma of lung, right (HCC)   CKD stage 3a, GFR 45-59 ml/min (HCC)   Sepsis secondary to UTI [facility])  Septic  shock from UTI Bolused with IV fluids Cultures obtained Continue Rocephin  Repeat CBC in the morning Tylenol  Hold blood pressure medications Currently BP is stable  Type 2 diabetes Hold Farxiga  Sliding scale insulin  Hypertension Hold antihypertensives CKD (AKI on CKD)  Continue IV fluids overnight Chronic diastolic heart failure Currently compensated Will be cautious with IV fluids.  Patient may need brief diuresis following stabilization GERD   Advance Care Planning:   Code Status: Full Code confirmed by patient  Consults: None  Family Communication: Daughter present during interview and exam  Severity of Illness: The appropriate patient status for this patient is INPATIENT. Inpatient status is judged to be reasonable and necessary in order to provide the required intensity of service to ensure the patient's safety. The patient's presenting symptoms, physical exam findings, and initial radiographic and laboratory data in the context of their chronic comorbidities is felt to place them at high risk for further clinical deterioration. Furthermore, it is not anticipated that the patient will be medically stable for discharge from the hospital within 2 midnights of admission.   * I certify that at the point of admission it is my clinical judgment that the patient will require inpatient hospital care spanning beyond 2 midnights from the  point of admission due to high intensity of service, high risk for further deterioration and high frequency of surveillance required.*  Author: Nicoletta Hush J Halford Goetzke, DO 06/13/2024 7:54 PM  For on call review [email or web address].

## 2024-06-13 NOTE — Progress Notes (Signed)
 Pharmacy Antibiotic Note  Sophia [name] is a 73 y.o. female admitted on 06/13/2024 with sepsis.  Pharmacy has been consulted for Vancomycin /Cefepime  dosing. WBC is elevated, noted renal dysfunction.   Plan: Vancomycin  1000 mg IV q48h  >>>Estimated AUC: 506 Cefepime  2g IV q24h Trend WBC, temp, renal function  F/U infectious work-up Drug levels as indicated   Height: 5' 9 (175.3 cm) Weight: 90 kg (198 lb 6.6 oz) IBW/kg (Calculated) : 66.2  Temp (24hrs), Avg:99.6 F (37.6 C), Min:98 F (36.7 C), Max:102.3 F (39.1 C)  Recent Labs  Lab 06/13/24 1620 06/13/24 1701 06/13/24 1924 06/13/24 2123  WBC 22.5*  --   --   --   CREATININE 3.12*  --   --   --   LATICACIDVEN  --  1.7 5.5* 1.9    Estimated Creatinine Clearance: 18.9 mL/min (A) (by C-G formula based on SCr of 3.12 mg/dL (H)).    No Known Allergies  Lynwood Mckusick, PharmD, BCPS Clinical Pharmacist Phone: [phone]

## 2024-06-13 NOTE — ED Notes (Signed)
 Dr Barbra at bedside. Pt still restless but slightly better. On bsc with two assist.

## 2024-06-13 NOTE — ED Notes (Signed)
Taken to ct. nad 

## 2024-06-13 NOTE — Sepsis Progress Note (Signed)
 Elink following code sepsis

## 2024-06-13 NOTE — ED Notes (Signed)
 Pt taken to ct

## 2024-06-13 NOTE — ED Triage Notes (Signed)
 Pt stated that she fell last night and hit her head and is now complaining of a headache and weakness. Pt recently told to take Farxiga  10 mg and has felt different since starting the medication. Pt hypotensive in triage

## 2024-06-14 LAB — BLOOD CULTURE ID PANEL (REFLEXED) - BCID2

## 2024-06-14 LAB — BASIC METABOLIC PANEL WITH GFR
Anion gap: 13 (ref 5–15)
BUN: 68 mg/dL — ABNORMAL HIGH (ref 8–23)
CO2: 22 mmol/L (ref 22–32)
Calcium: 8 mg/dL — ABNORMAL LOW (ref 8.9–10.3)
Chloride: 97 mmol/L — ABNORMAL LOW (ref 98–111)
Creatinine, Ser: 1.97 mg/dL — ABNORMAL HIGH (ref 0.44–1.00)
GFR, Estimated: 26 mL/min — ABNORMAL LOW (ref >60)
Glucose, Bld: 109 mg/dL — ABNORMAL HIGH (ref 70–99)
Potassium: 2.7 mmol/L — CL (ref 3.5–5.1)
Sodium: 132 mmol/L — ABNORMAL LOW (ref 135–145)

## 2024-06-14 LAB — GLUCOSE, CAPILLARY
Glucose-Capillary: 112 mg/dL — ABNORMAL HIGH (ref 70–99)
Glucose-Capillary: 128 mg/dL — ABNORMAL HIGH (ref 70–99)
Glucose-Capillary: 108 mg/dL — ABNORMAL HIGH (ref 70–99)
Glucose-Capillary: 131 mg/dL — ABNORMAL HIGH (ref 70–99)
Glucose-Capillary: 130 mg/dL — ABNORMAL HIGH (ref 70–99)

## 2024-06-14 LAB — LACTIC ACID, PLASMA: Lactic Acid, Venous: 1.9 mmol/L (ref 0.5–1.9)

## 2024-06-14 LAB — CBC
HCT: 31.4 % — ABNORMAL LOW (ref 36.0–46.0)
Hemoglobin: 9.7 g/dL — ABNORMAL LOW (ref 12.0–15.0)
MCH: 26.1 pg (ref 26.0–34.0)
MCHC: 30.9 g/dL (ref 30.0–36.0)
MCV: 84.6 fL (ref 80.0–100.0)
Platelets: 121 K/uL — ABNORMAL LOW (ref 150–400)
RBC: 3.71 MIL/uL — ABNORMAL LOW (ref 3.87–5.11)
RDW: 15.4 % (ref 11.5–15.5)
WBC: 19.5 K/uL — ABNORMAL HIGH (ref 4.0–10.5)
nRBC: 0 % (ref 0.0–0.2)

## 2024-06-14 LAB — MRSA NEXT GEN BY PCR, NASAL: MRSA by PCR Next Gen: NOT DETECTED

## 2024-06-14 MED ORDER — POTASSIUM CHLORIDE 10 MEQ/100ML IV SOLN
10.0000 meq | INTRAVENOUS | Status: AC
  Administered 2024-06-14 (×6): 10 meq via INTRAVENOUS
  Filled 2024-06-14 (×6): qty 100

## 2024-06-14 MED ORDER — SODIUM CHLORIDE 0.9 % IV SOLN
2.0000 g | INTRAVENOUS | Status: DC
  Administered 2024-06-14 – 2024-06-16 (×3): 2 g via INTRAVENOUS
  Filled 2024-06-14 (×3): qty 20

## 2024-06-14 MED ORDER — POTASSIUM CHLORIDE CRYS ER 20 MEQ PO TBCR
40.0000 meq | EXTENDED_RELEASE_TABLET | ORAL | Status: AC
  Administered 2024-06-14 (×2): 40 meq via ORAL
  Filled 2024-06-14 (×2): qty 2

## 2024-06-14 NOTE — Progress Notes (Signed)
 Date and time results received: 06/14/24 0847 (use smartphrase .now to insert current time)  Test: Blood Culture Critical Value: Anaerobic bottle positive for gram negative rods  Name of Provider Notified: Dr. Rendall  Orders Received? Or Actions Taken?: MD notified. Awaiting orders

## 2024-06-14 NOTE — Progress Notes (Signed)
 SLP Cancellation Note  Patient Details Name: Sophia [name] MRN: [id number] DOB: March 04, 1951   Cancelled treatment:        SLP unable to complete SLE on 7/28 due to scheduling and triaging patient needs. Will re-attempt as schedule allows.    Waddell JONETTA Novak, MA CCC-SLP 06/14/2024, 12:10 PM

## 2024-06-14 NOTE — Progress Notes (Signed)
 Date and time results received: 06/14/24 0622   Test: K+ Critical Value: 2.7  Name of Provider Notified: Dr Dena  Orders Received? Or Actions Taken?: MD aware and awaiting orders.  Kellogg RN

## 2024-06-14 NOTE — TOC Initial Note (Signed)
 Transition of Care [facility]) - Initial/Assessment Note    Patient Details  Name: Sophia [name] MRN: [id number] Date of Birth: 16-Jan-1951  [facility] of [facility]) CM/SW Contact:    Sophia [name] Phone Number: 06/14/2024, 10:24 AM  Clinical Narrative:                  CSW went to patient room and spoke with niece who was at bedside . Niece shared that patient lives in her own home with her son and fairly independent. Niece shared that patient has a rolling walker and regular walker and family assist with transportation needs. TOC to follow.   Expected Discharge Plan: Home/Self Care Barriers to Discharge: Continued Medical Work up   Patient Goals and CMS Choice Patient states their goals for this hospitalization and ongoing recovery are:: return back home     Expected Discharge Plan and Services     Post Acute Care Choice: Durable Medical Equipment Living arrangements for the past 2 months: Single Family Home                                      Prior Living Arrangements/Services Living arrangements for the past 2 months: Single Family Home Lives with:: Adult Children Patient language and need for interpreter reviewed:: Yes Do you feel safe going back to the place where you live?: Yes      Need for Family Participation in Patient Care: Yes (Comment) Care giver support system in place?: No (comment) Current home services: DME Criminal Activity/Legal Involvement Pertinent to Current Situation/Hospitalization: No - Comment as needed  Activities of Daily Living   ADL Screening (condition at time of admission) Independently performs ADLs?: No Does the patient have a NEW difficulty with bathing/dressing/toileting/self-feeding that is expected to last >3 days?: Yes (Initiates electronic notice to provider for possible OT consult) Does the patient have a NEW difficulty with getting in/out of bed, walking, or climbing stairs that is expected to last >3 days?: Yes  (Initiates electronic notice to provider for possible PT consult) Does the patient have a NEW difficulty with communication that is expected to last >3 days?: Yes (Initiates electronic notice to provider for possible SLP consult) Is the patient deaf or have difficulty hearing?: No Does the patient have difficulty seeing, even when wearing glasses/contacts?: No Does the patient have difficulty concentrating, remembering, or making decisions?: Yes  Permission Sought/Granted      Share Information with NAME: Sophia [name]     Permission granted to share info w Relationship: Niece     Emotional Assessment Appearance:: Appears stated age     Orientation: : Oriented to Self, Oriented to Place Alcohol / Substance Use: Not Applicable Psych Involvement: No (comment)  Admission diagnosis:  AKI (acute kidney injury) (HCC) [N17.9] Septic shock (HCC) [A41.9, R65.21] Urinary tract infection with hematuria, site unspecified [N39.0, R31.9] Sepsis, due to unspecified organism, unspecified whether acute organ dysfunction present [facility]) [A41.9] Patient Active Problem List   Diagnosis Date Noted   Septic shock (HCC) 06/13/2024   Sepsis secondary to UTI (HCC) 06/13/2024   Grief 06/06/2024   Right hip pain 06/04/2024   CKD stage 3a, GFR 45-59 ml/min (HCC) 06/01/2024   Non-small cell carcinoma of lung, right (HCC) 05/03/2024   Status post robot-assisted surgical procedure 03/31/2024   Right lower lobe pulmonary nodule 03/31/2024   Chronic diastolic heart failure (HCC) 03/09/2024   Type 2 diabetes mellitus  with other specified complication (HCC) 04/13/2023   Insomnia 04/13/2023   Neck pain 02/25/2023   DOE with cough on ACEi 11/15/2022   RUQ pain 10/16/2022   Deficiency anemia 11/11/2021   Weakness 09/19/2021   Fatigue 03/16/2021   At increased risk for cardiovascular disease 03/16/2021   Overweight (BMI 25.0-29.9) 03/16/2021   Esophageal dysphagia 01/31/2021   Neck muscle strain, initial encounter  12/26/2020   Panic anxiety syndrome 12/06/2020   Preop cardiovascular exam 09/12/2020   Depression, major, single episode, in partial remission (HCC) 12/12/2019   GAD (generalized anxiety disorder) 12/03/2018   Leg cramps 06/15/2018   Tubular adenoma of colon 04/28/2014   Tobacco dependence 04/28/2014   History of rectal bleeding 02/19/2014   Normocytic anemia 02/17/2014   Back pain with radiation 10/05/2013   Seasonal allergies 05/01/2011   Palpitations 05/17/2009   DYSPNEA 05/17/2009   Hyperlipidemia with target LDL less than 100 06/17/2008   Depression with anxiety 11/21/2006   Essential hypertension 11/21/2006   Gastroesophageal reflux disease 11/21/2006   PCP:  Sophia Rollene BRAVO, MD Pharmacy:   [place] [facility] [place], [place] - [address] [place] [address] Phone: [phone] Fax: [phone]     Social Drivers of Health (SDOH) Social History: SDOH Screenings   Food Insecurity: No Food Insecurity (06/13/2024)  Housing: Low Risk  (06/13/2024)  Transportation Needs: No Transportation Needs (06/13/2024)  Utilities: Not At Risk (06/13/2024)  Alcohol Screen: Low Risk  (10/22/2023)  Depression (PHQ2-9): Medium Risk (06/04/2024)  Financial Resource Strain: Low Risk  (10/22/2023)  Physical Activity: Insufficiently Active (10/22/2023)  Social Connections: Moderately Isolated (06/13/2024)  Stress: No Stress Concern Present (10/22/2023)  Tobacco Use: Medium Risk (06/13/2024)  Health Literacy: Adequate Health Literacy (10/22/2023)   SDOH Interventions:     Readmission Risk Interventions    06/14/2024   10:20 AM  Readmission Risk Prevention Plan  Transportation Screening Complete  HRI or Home Care Consult Complete  Social Work Consult for Recovery Care Planning/Counseling Complete  Palliative Care Screening Not Applicable  Medication Review Oceanographer) Complete

## 2024-06-14 NOTE — Progress Notes (Signed)
 PROGRESS NOTE  Sophia [name], is a 73 y.o. female, DOB - 29-Mar-1951, FMW:[id number]  Admit date - 06/13/2024   Admitting Physician Lang JINNY Peel, DO  Outpatient Primary MD for the patient is Antonetta Rollene BRAVO, MD  LOS - 1  Chief Complaint  Patient presents with   Fall      Brief Narrative:  73 y.o. female with medical history significant of diabetes hypertension, GERD, sleep apnea, stage III chronic kidney disease admitted on 06/13/2024 with severe sepsis with septic shock secondary to E. coli bacteremia UTI    -Assessment and Plan: 1)severe sepsis with septic shock secondary to E. coli bacteremia UTI -Blood cultures from 06/13/2024 with E. Coli---appears not to be ESBL -- Hemodynamics improving - Weaning off IV Levophed  and IV vasopressin  - Continue IV Rocephin  pending further culture sensitivities - Continue IV fluids  2)AKI----acute kidney injury  ---   creatinine on admission= 3.12,  baseline creatinine = 0.9-1.1   ,  -creatinine is now= 1.97, -Continue to hold olmesartan /HCTZ and chlorthalidone  -- Renal function improving with hydration and improvement in hemodynamics -- renally adjust medications, avoid nephrotoxic agents / dehydration  / hypotension  3)HTN--- BP soft in the setting of septic shock --Continue to hold olmesartan /HCTZ and chlorthalidone  - Weaning off pressors  4)DM2 ----A1c 5.8 reflecting excellent diabetic control PTA Use Novolog /Humalog Sliding scale insulin  with Accu-Cheks/Fingersticks as ordered   5)HFpEF--- chronic diastolic dysfunction CHF - No acute exacerbation - Monitor closely with IV fluids for #1 above  6)GERD--c/n Protonix  and Carafate   7)Depression/Anxiety--continue Prozac  and buspirone   8)HLD--continue Crestor   Status is: Inpatient   Disposition: The patient is from: Home              Anticipated d/c is to: Home              Anticipated d/c date is: 1 day              Patient currently is not medically stable to  d/c. Barriers: Not Clinically Stable-   Code Status :  -  Code Status: Full Code   Family Communication:    NA (patient is alert, awake and coherent)   DVT Prophylaxis  :   - SCDs   enoxaparin  (LOVENOX ) injection 30 mg Start: 06/13/24 2200   Lab Results  Component Value Date   PLT 121 (L) 06/14/2024    Inpatient Medications  Scheduled Meds:  aspirin  EC  81 mg Oral Daily   busPIRone   30 mg Oral BID   Chlorhexidine  Gluconate Cloth  6 each Topical Daily   enoxaparin  (LOVENOX ) injection  30 mg Subcutaneous Q24H   FLUoxetine   40 mg Oral Daily   insulin  aspart  0-15 Units Subcutaneous TID WC   insulin  aspart  0-5 Units Subcutaneous QHS   melatonin  3 mg Oral QHS   methocarbamol   500 mg Oral Once   pantoprazole   40 mg Oral Daily   rosuvastatin   20 mg Oral Daily   sucralfate   1 g Oral BID   Continuous Infusions:  sodium chloride  Stopped (06/14/24 0535)   0.9 % NaCl with KCl 20 mEq / L 100 mL/hr at 06/14/24 1625   cefTRIAXone  (ROCEPHIN )  IV Stopped (06/14/24 1551)   norepinephrine  (LEVOPHED ) Adult infusion Stopped (06/14/24 9167)   vasopressin  Stopped (06/14/24 0246)   PRN Meds:.acetaminophen  **OR** acetaminophen , ondansetron  **OR** ondansetron  (ZOFRAN ) IV, traZODone    Anti-infectives (From admission, onward)    Start     Dose/Rate Route Frequency Ordered Stop   06/15/24 1800  vancomycin  (VANCOCIN ) IVPB 1000 mg/200 mL premix  Status:  Discontinued        1,000 mg 200 mL/hr over 60 Minutes Intravenous Every 48 hours 06/13/24 2327 06/14/24 1433   06/14/24 1800  ceFEPIme  (MAXIPIME ) 2 g in sodium chloride  0.9 % 100 mL IVPB  Status:  Discontinued        2 g 200 mL/hr over 30 Minutes Intravenous Every 24 hours 06/13/24 2327 06/14/24 1433   06/14/24 1700  cefTRIAXone  (ROCEPHIN ) 1 g in sodium chloride  0.9 % 100 mL IVPB  Status:  Discontinued        1 g 200 mL/hr over 30 Minutes Intravenous Every 24 hours 06/13/24 2015 06/13/24 2209   06/14/24 1600  cefTRIAXone  (ROCEPHIN ) 2 g in  sodium chloride  0.9 % 100 mL IVPB        2 g 200 mL/hr over 30 Minutes Intravenous Every 24 hours 06/14/24 1434     06/13/24 1645  ceFEPIme  (MAXIPIME ) 2 g in sodium chloride  0.9 % 100 mL IVPB        2 g 200 mL/hr over 30 Minutes Intravenous  Once 06/13/24 1633 06/13/24 1758   06/13/24 1645  metroNIDAZOLE  (FLAGYL ) IVPB 500 mg        500 mg 100 mL/hr over 60 Minutes Intravenous  Once 06/13/24 1633 06/13/24 1759   06/13/24 1645  vancomycin  (VANCOCIN ) IVPB 1000 mg/200 mL premix        1,000 mg 200 mL/hr over 60 Minutes Intravenous  Once 06/13/24 1633 06/13/24 1802         Subjective: Sophia [name] today has no emesis,  No chest pain,   - Tmax 102.3 - T-current 98.2 1 episode of loose stools overnight- -BP improving weaning off Levophed  and vasopressin   Objective: Vitals:   06/14/24 1430 06/14/24 1500 06/14/24 1530 06/14/24 1600  BP: 106/63 112/64 111/62 106/67  Pulse: 72 78 72 71  Resp: 15 15 15 14   Temp:    98.2 F (36.8 C)  TempSrc:    Oral  SpO2: 94% 93% 95% 95%  Weight:      Height:        Intake/Output Summary (Last 24 hours) at 06/14/2024 1649 Last data filed at 06/14/2024 1633 Gross per 24 hour  Intake 3434.39 ml  Output 3900 ml  Net -465.61 ml   Filed Weights   06/13/24 1552 06/13/24 1946  Weight: 86 kg 90 kg    Physical Exam  Gen:- Awake Alert, in no acute distress HEENT:- [place].AT, No sclera icterus Neck-Supple Neck,No JVD,.  Lungs-  CTAB , fair symmetrical air movement CV- S1, S2 normal, regular  Abd-  +ve B.Sounds, Abd Soft, No tenderness, no CVA area tenderness Extremity/Skin:- No  edema, pedal pulses present  Psych-affect is appropriate, oriented x3 Neuro-no new focal deficits, no tremors  Data Reviewed: I have personally reviewed following labs and imaging studies  CBC: Recent Labs  Lab 06/13/24 1620 06/14/24 0425  WBC 22.5* 19.5*  NEUTROABS 19.6*  --   HGB 10.4* 9.7*  HCT 33.0* 31.4*  MCV 83.1 84.6  PLT 186 121*   Basic Metabolic  Panel: Recent Labs  Lab 06/13/24 1620 06/14/24 0425  NA 133* 132*  K 3.3* 2.7*  CL 93* 97*  CO2 24 22  GLUCOSE 98 109*  BUN 90* 68*  CREATININE 3.12* 1.97*  CALCIUM  9.1 8.0*  MG 1.8  --    GFR: Estimated Creatinine Clearance: 29.9 mL/min (A) (by C-G formula based on SCr of 1.97 mg/dL (H)).  Recent Results (from the past 240 hours)  Resp panel by RT-PCR (RSV, Flu A&B, Covid) Anterior Nasal Swab     Status: None   Collection Time: 06/13/24  4:31 PM   Specimen: Anterior Nasal Swab  Result Value Ref Range Status   SARS Coronavirus 2 by RT PCR NEGATIVE NEGATIVE Final    Comment: (NOTE) SARS-CoV-2 target nucleic acids are NOT DETECTED.  The SARS-CoV-2 RNA is generally detectable in upper respiratory specimens during the acute phase of infection. The lowest concentration of SARS-CoV-2 viral copies this assay can detect is 138 copies/mL. A negative result does not preclude SARS-Cov-2 infection and should not be used as the sole basis for treatment or other patient management decisions. A negative result may occur with  improper specimen collection/handling, submission of specimen other than nasopharyngeal swab, presence of viral mutation(s) within the areas targeted by this assay, and inadequate number of viral copies(<138 copies/mL). A negative result must be combined with clinical observations, patient history, and epidemiological information. The expected result is Negative.  Fact Sheet for Patients:  BloggerCourse.com  Fact Sheet for Healthcare Providers:  SeriousBroker.it  This test is no t yet approved or cleared by the [place]  FDA and  has been authorized for detection and/or diagnosis of SARS-CoV-2 by FDA under an Emergency Use Authorization (EUA). This EUA will remain  in effect (meaning this test can be used) for the duration of the COVID-19 declaration under Section 564(b)(1) of the Act, 21 U.S.C.section  360bbb-3(b)(1), unless the authorization is terminated  or revoked sooner.       Influenza A by PCR NEGATIVE NEGATIVE Final   Influenza B by PCR NEGATIVE NEGATIVE Final    Comment: (NOTE) The Xpert Xpress SARS-CoV-2/FLU/RSV plus assay is intended as an aid in the diagnosis of influenza from Nasopharyngeal swab specimens and should not be used as a sole basis for treatment. Nasal washings and aspirates are unacceptable for Xpert Xpress SARS-CoV-2/FLU/RSV testing.  Fact Sheet for Patients: BloggerCourse.com  Fact Sheet for Healthcare Providers: SeriousBroker.it  This test is not yet approved or cleared by the [place]  FDA and has been authorized for detection and/or diagnosis of SARS-CoV-2 by FDA under an Emergency Use Authorization (EUA). This EUA will remain in effect (meaning this test can be used) for the duration of the COVID-19 declaration under Section 564(b)(1) of the Act, 21 U.S.C. section 360bbb-3(b)(1), unless the authorization is terminated or revoked.     Resp Syncytial Virus by PCR NEGATIVE NEGATIVE Final    Comment: (NOTE) Fact Sheet for Patients: BloggerCourse.com  Fact Sheet for Healthcare Providers: SeriousBroker.it  This test is not yet approved or cleared by the [place]  FDA and has been authorized for detection and/or diagnosis of SARS-CoV-2 by FDA under an Emergency Use Authorization (EUA). This EUA will remain in effect (meaning this test can be used) for the duration of the COVID-19 declaration under Section 564(b)(1) of the Act, 21 U.S.C. section 360bbb-3(b)(1), unless the authorization is terminated or revoked.  Performed at [facility], [address], [place], [place] [address]   Blood Culture (routine x 2)     Status: None (Preliminary result)   Collection Time: 06/13/24  5:01 PM   Specimen: Left Antecubital; Blood  Result Value Ref  Range Status   Specimen Description   Final    LEFT ANTECUBITAL BOTTLES DRAWN AEROBIC AND ANAEROBIC Performed at [facility], [address], [place], [place] [address]    Special Requests   Final    Blood  Culture adequate volume Performed at [facility], [address], [place], [place] [address]    Culture  Setup Time   Final    GRAM NEGATIVE RODS IN BOTH AEROBIC AND ANAEROBIC BOTTLES Gram Stain Report Called to,Read Back By and Verified With: G ROOS AT 0844 ON [id number] BY  S DALTON GRAM STAIN REVIEWED-AGREE WITH RESULT DRT CRITICAL VALUE NOTED.  VALUE IS CONSISTENT WITH PREVIOUSLY REPORTED AND CALLED VALUE. Performed at [facility], [address], [place], [place] [address]    Culture GRAM NEGATIVE RODS  Final   Report Status PENDING  Incomplete  Blood Culture (routine x 2)     Status: None (Preliminary result)   Collection Time: 06/13/24  5:01 PM   Specimen: BLOOD LEFT HAND  Result Value Ref Range Status   Specimen Description   Final    BLOOD LEFT HAND AEROBIC BOTTLE ONLY Performed at [facility], [address], [place], [place] [address]    Special Requests   Final    Blood Culture results may not be optimal due to an inadequate volume of blood received in culture bottles Performed at [facility], [address], [place], [place] [address]    Culture  Setup Time   Final    GRAM NEGATIVE RODS AEROBIC BOTTLE ONLY Gram Stain Report Called to,Read Back By and Verified With: A SHELTON AT 0715 ON [id number] BY S DALTON CRITICAL RESULT CALLED TO, READ BACK BY AND VERIFIED WITH: PHARMD STEVEN HURTH ON 06/14/24 @ 1250 BY DRT Performed at [facility], [address] [place], [place] [address]    Culture GRAM NEGATIVE RODS  Final   Report Status PENDING  Incomplete  Blood Culture ID Panel (Reflexed)     Status: Abnormal   Collection Time: 06/13/24  5:01 PM  Result Value Ref Range Status   Enterococcus faecalis NOT DETECTED NOT DETECTED Final   Enterococcus Faecium  NOT DETECTED NOT DETECTED Final   Listeria monocytogenes NOT DETECTED NOT DETECTED Final   Staphylococcus species NOT DETECTED NOT DETECTED Final   Staphylococcus aureus (BCID) NOT DETECTED NOT DETECTED Final   Staphylococcus epidermidis NOT DETECTED NOT DETECTED Final   Staphylococcus lugdunensis NOT DETECTED NOT DETECTED Final   Streptococcus species NOT DETECTED NOT DETECTED Final   Streptococcus agalactiae NOT DETECTED NOT DETECTED Final   Streptococcus pneumoniae NOT DETECTED NOT DETECTED Final   Streptococcus pyogenes NOT DETECTED NOT DETECTED Final   A.calcoaceticus-baumannii NOT DETECTED NOT DETECTED Final   Bacteroides fragilis NOT DETECTED NOT DETECTED Final   Enterobacterales DETECTED (A) NOT DETECTED Final    Comment: Enterobacterales represent a large order of gram negative bacteria, not a single organism. CRITICAL RESULT CALLED TO, READ BACK BY AND VERIFIED WITH: PHARMD STEVEN HURTH ON 06/14/24 @ 1250 BY DRT    Enterobacter cloacae complex NOT DETECTED NOT DETECTED Final   Escherichia coli DETECTED (A) NOT DETECTED Final    Comment: CRITICAL RESULT CALLED TO, READ BACK BY AND VERIFIED WITH: PHARMD STEVEN HURTH ON 06/14/24 @ 1250 BY DRT    Klebsiella aerogenes NOT DETECTED NOT DETECTED Final   Klebsiella oxytoca NOT DETECTED NOT DETECTED Final   Klebsiella pneumoniae NOT DETECTED NOT DETECTED Final   Proteus species NOT DETECTED NOT DETECTED Final   Salmonella species NOT DETECTED NOT DETECTED Final   Serratia marcescens NOT DETECTED NOT DETECTED Final   Haemophilus influenzae NOT DETECTED NOT DETECTED Final   Neisseria meningitidis NOT DETECTED NOT DETECTED Final   Pseudomonas aeruginosa NOT  DETECTED NOT DETECTED Final   Stenotrophomonas maltophilia NOT DETECTED NOT DETECTED Final   Candida albicans NOT DETECTED NOT DETECTED Final   Candida auris NOT DETECTED NOT DETECTED Final   Candida glabrata NOT DETECTED NOT DETECTED Final   Candida krusei NOT DETECTED NOT DETECTED  Final   Candida parapsilosis NOT DETECTED NOT DETECTED Final   Candida tropicalis NOT DETECTED NOT DETECTED Final   Cryptococcus neoformans/gattii NOT DETECTED NOT DETECTED Final   CTX-M ESBL NOT DETECTED NOT DETECTED Final   Carbapenem resistance IMP NOT DETECTED NOT DETECTED Final   Carbapenem resistance KPC NOT DETECTED NOT DETECTED Final   Carbapenem resistance NDM NOT DETECTED NOT DETECTED Final   Carbapenem resist OXA 48 LIKE NOT DETECTED NOT DETECTED Final   Carbapenem resistance VIM NOT DETECTED NOT DETECTED Final    Comment: Performed at [facility], [address] [place], [place] [address]  MRSA Next Gen by PCR, Nasal     Status: None   Collection Time: 06/13/24  7:54 PM   Specimen: Nasal Mucosa; Nasal Swab  Result Value Ref Range Status   MRSA by PCR Next Gen NOT DETECTED NOT DETECTED Final    Comment: (NOTE) The GeneXpert MRSA Assay (FDA approved for NASAL specimens only), is one component of a comprehensive MRSA colonization surveillance program. It is not intended to diagnose MRSA infection nor to guide or monitor treatment for MRSA infections. Test performance is not FDA approved in patients less than 72 years old. Performed at [facility], [address], [place], [place] [address]     Radiology Studies: CT CHEST ABDOMEN PELVIS WO CONTRAST Result Date: 06/13/2024 CLINICAL DATA:  Sepsis EXAM: CT CHEST, ABDOMEN AND PELVIS WITHOUT CONTRAST TECHNIQUE: Multidetector CT imaging of the chest, abdomen and pelvis was performed following the standard protocol without IV contrast. RADIATION DOSE REDUCTION: This exam was performed according to the departmental dose-optimization program which includes automated exposure control, adjustment of the mA and/or kV according to patient size and/or use of iterative reconstruction technique. COMPARISON:  PET CT 01/09/2024. FINDINGS: CT CHEST FINDINGS Cardiovascular: No significant vascular findings. Normal heart size. No pericardial  effusion. There are atherosclerotic calcifications of the aorta. Mediastinum/Nodes: No enlarged mediastinal, hilar, or axillary lymph nodes. Thyroid  gland, trachea, and esophagus demonstrate no significant findings. Lungs/Pleura: Patient is status post resection of right lower lobe nodule. There is some atelectasis along the staple line with trace right pleural effusion. There is a 2 mm right lower lobe nodule image 4/73 which is not definitely seen on prior PET-CT, although this may be related to differences in technique. Mild emphysema again noted. Musculoskeletal: No chest wall mass or suspicious bone lesions identified. CT ABDOMEN PELVIS FINDINGS Hepatobiliary: No focal liver abnormality is seen. No gallstones, gallbladder wall thickening, or biliary dilatation. Pancreas: Unremarkable. No pancreatic ductal dilatation or surrounding inflammatory changes. Spleen: Normal in size without focal abnormality. Adrenals/Urinary Tract: Adrenal glands are unremarkable. Kidneys are normal, without renal calculi, focal lesion, or hydronephrosis. Bladder is unremarkable. Stomach/Bowel: Stomach is within normal limits. Appendix is not seen. No evidence of bowel wall thickening, distention, or inflammatory changes. Vascular/Lymphatic: Aortic atherosclerosis. No enlarged abdominal or pelvic lymph nodes. Reproductive: Status post hysterectomy. No adnexal masses. Other: No abdominal wall hernia or abnormality. No abdominopelvic ascites. Musculoskeletal: No acute or significant osseous findings. IMPRESSION: 1. Status post resection of right lower lobe nodule. There is some atelectasis along the staple line with trace right pleural effusion. 2. 2 mm right lower lobe nodule is not  definitely seen on prior PET-CT, although this may be related to differences in technique. 3. No acute localizing process in the abdomen or pelvis. Aortic Atherosclerosis (ICD10-I70.0) and Emphysema (ICD10-J43.9). Electronically Signed   By: Greig Pique  M.D.   On: 06/13/2024 18:12   CT Cervical Spine Wo Contrast Result Date: 06/13/2024 CLINICAL DATA:  Fall.  Blunt head and neck trauma. EXAM: CT CERVICAL SPINE WITHOUT CONTRAST TECHNIQUE: Multidetector CT imaging of the cervical spine was performed without intravenous contrast. Multiplanar CT image reconstructions were also generated. RADIATION DOSE REDUCTION: This exam was performed according to the departmental dose-optimization program which includes automated exposure control, adjustment of the mA and/or kV according to patient size and/or use of iterative reconstruction technique. COMPARISON:  None Available. FINDINGS: Alignment: Normal. Skull base and vertebrae: No acute fracture. No primary bone lesion or focal pathologic process. Soft tissues and spinal canal: No prevertebral fluid or swelling. No visible canal hematoma. Disc levels: No disc space narrowing. Moderate bilateral facet DJD is seen at C3-4 and C4-5. Atlantoaxial degenerative changes also noted. Upper chest: No acute findings. Other: None. IMPRESSION: No evidence of cervical spine fracture or subluxation. Degenerative cervical spondylosis, as described above. Electronically Signed   By: Norleen DELENA Kil M.D.   On: 06/13/2024 16:53   DG Chest Portable 1 View Result Date: 06/13/2024 CLINICAL DATA:  Recent fall.  Hypotension. EXAM: PORTABLE CHEST 1 VIEW COMPARISON:  05/11/2024 FINDINGS: The heart size and mediastinal contours are within normal limits. Both lungs are clear. The visualized skeletal structures are unremarkable. IMPRESSION: No active disease. Electronically Signed   By: Norleen DELENA Kil M.D.   On: 06/13/2024 16:50   CT Head Wo Contrast Result Date: 06/13/2024 CLINICAL DATA:  Fall last night with blunt head trauma. Headache and weakness. EXAM: CT HEAD WITHOUT CONTRAST TECHNIQUE: Contiguous axial images were obtained from the base of the skull through the vertex without intravenous contrast. RADIATION DOSE REDUCTION: This exam was  performed according to the departmental dose-optimization program which includes automated exposure control, adjustment of the mA and/or kV according to patient size and/or use of iterative reconstruction technique. COMPARISON:  None Available. FINDINGS: Brain: No evidence of intracranial hemorrhage, acute infarction, hydrocephalus, extra-axial collection, or mass lesion/mass effect. Vascular:  No hyperdense vessel or other acute findings. Skull: No evidence of fracture or other significant bone abnormality. Sinuses/Orbits:  No acute findings. Other: None. IMPRESSION: Negative noncontrast head CT. Electronically Signed   By: Norleen DELENA Kil M.D.   On: 06/13/2024 16:49   Scheduled Meds:  aspirin  EC  81 mg Oral Daily   busPIRone   30 mg Oral BID   Chlorhexidine  Gluconate Cloth  6 each Topical Daily   enoxaparin  (LOVENOX ) injection  30 mg Subcutaneous Q24H   FLUoxetine   40 mg Oral Daily   insulin  aspart  0-15 Units Subcutaneous TID WC   insulin  aspart  0-5 Units Subcutaneous QHS   melatonin  3 mg Oral QHS   methocarbamol   500 mg Oral Once   pantoprazole   40 mg Oral Daily   rosuvastatin   20 mg Oral Daily   sucralfate   1 g Oral BID  Continuous Infusions:  sodium chloride  Stopped (06/14/24 0535)   0.9 % NaCl with KCl 20 mEq / L 100 mL/hr at 06/14/24 1625   cefTRIAXone  (ROCEPHIN )  IV Stopped (06/14/24 1551)   norepinephrine  (LEVOPHED ) Adult infusion Stopped (06/14/24 9167)   vasopressin  Stopped (06/14/24 0246)    LOS: 1 day   Rendall Carwin M.D on 06/14/2024 at 4:49 PM  Go to [email or web address] - for contact info  [facility] - Office  [phone]  If 7PM-7AM, please contact night-coverage [email or web address] 06/14/2024, 4:49 PM

## 2024-06-14 NOTE — Plan of Care (Signed)
  Problem: Clinical Measurements: Goal: Ability to maintain clinical measurements within normal limits will improve Outcome: Progressing Goal: Respiratory complications will improve Outcome: Progressing Goal: Cardiovascular complication will be avoided Outcome: Progressing   Problem: Nutrition: Goal: Adequate nutrition will be maintained Outcome: Progressing   Problem: Coping: Goal: Level of anxiety will decrease Outcome: Progressing   Problem: Elimination: Goal: Will not experience complications related to bowel motility Outcome: Progressing Goal: Will not experience complications related to urinary retention Outcome: Progressing   Problem: Pain Managment: Goal: General experience of comfort will improve and/or be controlled Outcome: Progressing

## 2024-06-14 NOTE — Progress Notes (Signed)
 Date and time results received: 06/14/24 0725 (use smartphrase .now to insert current time)  Test: Blood Culture  Critical Value: aerobic bottle gram negative rods  Name of Provider Notified: Dr Rendall  Orders Received? Or Actions Taken?: No new orders yet

## 2024-06-14 NOTE — Plan of Care (Signed)
  Problem: Education: Goal: Knowledge of General Education information will improve Description: Including pain rating scale, medication(s)/side effects and non-pharmacologic comfort measures Outcome: Progressing   Problem: Health Behavior/Discharge Planning: Goal: Ability to manage health-related needs will improve Outcome: Progressing   Problem: Clinical Measurements: Goal: Ability to maintain clinical measurements within normal limits will improve Outcome: Progressing Goal: Will remain free from infection Outcome: Progressing Goal: Diagnostic test results will improve Outcome: Progressing Goal: Respiratory complications will improve Outcome: Progressing Goal: Cardiovascular complication will be avoided Outcome: Progressing   Problem: Activity: Goal: Risk for activity intolerance will decrease Outcome: Not Progressing   Problem: Nutrition: Goal: Adequate nutrition will be maintained Outcome: Not Progressing   Problem: Coping: Goal: Level of anxiety will decrease Outcome: Progressing   Problem: Elimination: Goal: Will not experience complications related to bowel motility Outcome: Progressing Goal: Will not experience complications related to urinary retention Outcome: Progressing   Problem: Pain Managment: Goal: General experience of comfort will improve and/or be controlled Outcome: Progressing   Problem: Safety: Goal: Ability to remain free from injury will improve Outcome: Progressing   Problem: Skin Integrity: Goal: Risk for impaired skin integrity will decrease Outcome: Progressing   Problem: Education: Goal: Ability to describe self-care measures that may prevent or decrease complications (Diabetes Survival Skills Education) will improve Outcome: Progressing Goal: Individualized Educational Video(s) Outcome: Progressing   Problem: Coping: Goal: Ability to adjust to condition or change in health will improve Outcome: Progressing   Problem: Fluid  Volume: Goal: Ability to maintain a balanced intake and output will improve Outcome: Progressing   Problem: Health Behavior/Discharge Planning: Goal: Ability to identify and utilize available resources and services will improve Outcome: Progressing Goal: Ability to manage health-related needs will improve Outcome: Progressing   Problem: Metabolic: Goal: Ability to maintain appropriate glucose levels will improve Outcome: Progressing   Problem: Nutritional: Goal: Maintenance of adequate nutrition will improve Outcome: Progressing Goal: Progress toward achieving an optimal weight will improve Outcome: Progressing   Problem: Skin Integrity: Goal: Risk for impaired skin integrity will decrease Outcome: Progressing   Problem: Tissue Perfusion: Goal: Adequacy of tissue perfusion will improve Outcome: Progressing

## 2024-06-14 NOTE — Plan of Care (Signed)

## 2024-06-15 DIAGNOSIS — A419 Sepsis, unspecified organism: Secondary | ICD-10-CM | POA: Diagnosis not present

## 2024-06-15 DIAGNOSIS — R6521 Severe sepsis with septic shock: Secondary | ICD-10-CM | POA: Diagnosis not present

## 2024-06-15 LAB — RENAL FUNCTION PANEL
Albumin: 2.6 g/dL — ABNORMAL LOW (ref 3.5–5.0)
Anion gap: 10 (ref 5–15)
BUN: 57 mg/dL — ABNORMAL HIGH (ref 8–23)
CO2: 25 mmol/L (ref 22–32)
Calcium: 8.1 mg/dL — ABNORMAL LOW (ref 8.9–10.3)
Chloride: 106 mmol/L (ref 98–111)
Creatinine, Ser: 1.52 mg/dL — ABNORMAL HIGH (ref 0.44–1.00)
GFR, Estimated: 36 mL/min — ABNORMAL LOW (ref >60)
Glucose, Bld: 105 mg/dL — ABNORMAL HIGH (ref 70–99)
Phosphorus: 2 mg/dL — ABNORMAL LOW (ref 2.5–4.6)
Potassium: 3.8 mmol/L (ref 3.5–5.1)
Sodium: 141 mmol/L (ref 135–145)

## 2024-06-15 LAB — GLUCOSE, CAPILLARY
Glucose-Capillary: 130 mg/dL — ABNORMAL HIGH (ref 70–99)
Glucose-Capillary: 185 mg/dL — ABNORMAL HIGH (ref 70–99)
Glucose-Capillary: 114 mg/dL — ABNORMAL HIGH (ref 70–99)
Glucose-Capillary: 101 mg/dL — ABNORMAL HIGH (ref 70–99)

## 2024-06-15 LAB — CBC
HCT: 29.2 % — ABNORMAL LOW (ref 36.0–46.0)
Hemoglobin: 9.3 g/dL — ABNORMAL LOW (ref 12.0–15.0)
MCH: 26.1 pg (ref 26.0–34.0)
MCHC: 31.8 g/dL (ref 30.0–36.0)
MCV: 82 fL (ref 80.0–100.0)
Platelets: 123 K/uL — ABNORMAL LOW (ref 150–400)
RBC: 3.56 MIL/uL — ABNORMAL LOW (ref 3.87–5.11)
RDW: 15.6 % — ABNORMAL HIGH (ref 11.5–15.5)
WBC: 26.5 K/uL — ABNORMAL HIGH (ref 4.0–10.5)
nRBC: 0 % (ref 0.0–0.2)

## 2024-06-15 MED ORDER — POTASSIUM PHOSPHATES 15 MMOLE/5ML IV SOLN
30.0000 mmol | Freq: Once | INTRAVENOUS | Status: AC
  Administered 2024-06-15: 30 mmol via INTRAVENOUS
  Filled 2024-06-15: qty 10

## 2024-06-15 MED ORDER — ENOXAPARIN SODIUM 40 MG/0.4ML IJ SOSY
40.0000 mg | PREFILLED_SYRINGE | INTRAMUSCULAR | Status: DC
  Administered 2024-06-15 – 2024-06-16 (×2): 40 mg via SUBCUTANEOUS
  Filled 2024-06-15 (×2): qty 0.4

## 2024-06-15 MED ORDER — SENNOSIDES-DOCUSATE SODIUM 8.6-50 MG PO TABS
2.0000 | ORAL_TABLET | Freq: Every day | ORAL | Status: DC
  Administered 2024-06-15 – 2024-06-16 (×2): 2 via ORAL
  Filled 2024-06-15 (×2): qty 2

## 2024-06-15 MED ORDER — POLYETHYLENE GLYCOL 3350 17 G PO PACK
17.0000 g | PACK | Freq: Every day | ORAL | Status: DC
  Administered 2024-06-15 – 2024-06-17 (×3): 17 g via ORAL
  Filled 2024-06-15 (×3): qty 1

## 2024-06-15 MED ORDER — ALPRAZOLAM 0.5 MG PO TABS
0.5000 mg | ORAL_TABLET | Freq: Two times a day (BID) | ORAL | Status: DC | PRN
  Administered 2024-06-15 – 2024-06-16 (×2): 0.5 mg via ORAL
  Filled 2024-06-15 (×2): qty 1

## 2024-06-15 NOTE — Plan of Care (Signed)
  Problem: Acute Rehab PT Goals(only PT should resolve) Goal: Pt Will Go Supine/Side To Sit Outcome: Progressing Flowsheets (Taken 06/15/2024 1622) Pt will go Supine/Side to Sit: Independently Goal: Patient Will Transfer Sit To/From Stand Outcome: Progressing Flowsheets (Taken 06/15/2024 1622) Patient will transfer sit to/from stand: with modified independence Goal: Pt Will Transfer Bed To Chair/Chair To Bed Outcome: Progressing Flowsheets (Taken 06/15/2024 1622) Pt will Transfer Bed to Chair/Chair to Bed: with modified independence Goal: Pt Will Ambulate Outcome: Progressing Flowsheets (Taken 06/15/2024 1622) Pt will Ambulate:  50 feet  with rolling walker  with supervision  4:23 PM, 06/15/24 Rosaria Settler, PT, DPT [place] with [facility]

## 2024-06-15 NOTE — Evaluation (Signed)
 Speech Language Pathology Evaluation Patient Details Name: Sophia [name] MRN: [id number] DOB: Mar 17, 1951 Today's Date: 06/15/2024 Time: 8944-8890 SLP Time Calculation (min) (ACUTE ONLY): 14 min  Problem List:  Patient Active Problem List   Diagnosis Date Noted   Septic shock (HCC) 06/13/2024   Sepsis secondary to UTI (HCC) 06/13/2024   Grief 06/06/2024   Right hip pain 06/04/2024   CKD stage 3a, GFR 45-59 ml/min (HCC) 06/01/2024   Non-small cell carcinoma of lung, right (HCC) 05/03/2024   Status post robot-assisted surgical procedure 03/31/2024   Right lower lobe pulmonary nodule 03/31/2024   Chronic diastolic heart failure (HCC) 03/09/2024   Type 2 diabetes mellitus with other specified complication (HCC) 04/13/2023   Insomnia 04/13/2023   Neck pain 02/25/2023   DOE with cough on ACEi 11/15/2022   RUQ pain 10/16/2022   Deficiency anemia 11/11/2021   Weakness 09/19/2021   Fatigue 03/16/2021   At increased risk for cardiovascular disease 03/16/2021   Overweight (BMI 25.0-29.9) 03/16/2021   Esophageal dysphagia 01/31/2021   Neck muscle strain, initial encounter 12/26/2020   Panic anxiety syndrome 12/06/2020   Preop cardiovascular exam 09/12/2020   Depression, major, single episode, in partial remission (HCC) 12/12/2019   GAD (generalized anxiety disorder) 12/03/2018   Leg cramps 06/15/2018   Tubular adenoma of colon 04/28/2014   Tobacco dependence 04/28/2014   History of rectal bleeding 02/19/2014   Normocytic anemia 02/17/2014   Back pain with radiation 10/05/2013   Seasonal allergies 05/01/2011   Palpitations 05/17/2009   DYSPNEA 05/17/2009   Hyperlipidemia with target LDL less than 100 06/17/2008   Depression with anxiety 11/21/2006   Essential hypertension 11/21/2006   Gastroesophageal reflux disease 11/21/2006   Past Medical History:  Past Medical History:  Diagnosis Date   Acute respiratory disease due to COVID-19 virus 08/28/2019   Anxiety    Arthritis     Depression    Diabetes mellitus type 2 in obese 11/28/2010   Qualifier: Diagnosis of  By: Antonetta MD, Margaret  Diet controlled in 12/2016    Diabetes mellitus without complication (HCC)    GERD (gastroesophageal reflux disease)    Hypercholesteremia    Hypertension    PONV (postoperative nausea and vomiting)    Sleep apnea    Past Surgical History:  Past Surgical History:  Procedure Laterality Date   ABDOMINAL HYSTERECTOMY  11/18/1980   tubes and womb, bleeding and ectopic   ABDOMINAL SURGERY     removal of tumors   BALLOON DILATION N/A 02/16/2021   Procedure: BALLOON DILATION;  Surgeon: Cindie Carlin POUR, DO;  [facility];  Service: Endoscopy;  Laterality: N/A;   BIOPSY  02/16/2021   Procedure: BIOPSY;  Surgeon: Cindie Carlin POUR, DO;  Location: [facility];  Service: Endoscopy;;   BREAST SURGERY Left 11/18/1981   benign tumor   CATARACT EXTRACTION W/PHACO Right 01/11/2014   Procedure: CATARACT EXTRACTION PHACO AND INTRAOCULAR LENS PLACEMENT (IOC);  Surgeon: Oneil T. Roz, MD;  Location: [facility];  Service: Ophthalmology;  Laterality: Right;  CDE 5.57   CATARACT EXTRACTION W/PHACO Left 01/25/2014   Procedure: CATARACT EXTRACTION PHACO AND INTRAOCULAR LENS PLACEMENT (IOC);  Surgeon: Oneil T. Roz, MD;  Location: [facility];  Service: Ophthalmology;  Laterality: Left;  CDE:5.19   COLONOSCOPY  2007 [facility]   NL EXAM   COLONOSCOPY N/A 04/22/2014   Dr. Fields:Normal mucosa in the terminal ileum/Two COLON polyps REMOVED/ Mild diverticulosis in the ascending colon and transverse colon/The LEFT colon IS redundant/Small internal hemorrhoids. Path: tubular  adenoma. Next colonoscopy in 5-10 years   COLONOSCOPY WITH PROPOFOL  N/A 11/27/2020   Surgeon: Cindie Carlin POUR, DO;  Nonbleeding internal hemorrhoids, one 6 mm transverse colon polyp resected, diverticulosis in the sigmoid colon.  Pathology with tubular adenoma.  Repeat in 5 years.   ESOPHAGOGASTRODUODENOSCOPY N/A 04/22/2014    Dr. Fields:MICROCYTIC ANEMIA MOST LIKELY DUE TO ASA/VOLTAREN /Small hiatal hernia/MODERATE Non-erosive gastritis. Negative H.pylori   ESOPHAGOGASTRODUODENOSCOPY (EGD) WITH PROPOFOL  N/A 02/16/2021   Surgeon: Cindie Carlin POUR, DO; benign-appearing esophageal stenosis s/p dilation, gastritis biopsied.  Biopsies were benign.   INTERCOSTAL NERVE BLOCK Right 03/31/2024   Procedure: BLOCK, NERVE, INTERCOSTAL;  Surgeon: Kerrin Elspeth BROCKS, MD;  Location: [facility] OR;  Service: Thoracic;  Laterality: Right;   LEFT HEART CATH AND CORONARY ANGIOGRAPHY N/A 06/23/2018   Procedure: LEFT HEART CATH AND CORONARY ANGIOGRAPHY;  Surgeon: Dann Candyce RAMAN, MD;  Location: [facility] INVASIVE CV LAB;  Service: Cardiovascular;  Laterality: N/A;   LYMPH NODE BIOPSY Right 03/31/2024   Procedure: LYMPH NODE BIOPSY;  Surgeon: Kerrin Elspeth BROCKS, MD;  Location: [facility] OR;  Service: Thoracic;  Laterality: Right;   POLYPECTOMY  11/27/2020   Procedure: POLYPECTOMY;  Surgeon: Cindie Carlin POUR, DO;  [facility];  Service: Endoscopy;;   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   ULTRASOUND GUIDANCE FOR VASCULAR ACCESS  06/23/2018   Procedure: Ultrasound Guidance For Vascular Access;  Surgeon: Dann Candyce RAMAN, MD;  Location: [facility] INVASIVE CV LAB;  Service: Cardiovascular;;   UPPER GASTROINTESTINAL ENDOSCOPY  2007 CHEST PAIN   NL EXAM   WEDGE RESECTION, LUNG, ROBOT-ASSISTED, THORACOSCOPIC  03/31/2024   Procedure: WEDGE RESECTION, LUNG, ROBOT-ASSISTED, THORACOSCOPIC;  Surgeon: Kerrin Elspeth BROCKS, MD;  Location: [facility] OR;  Service: Thoracic;;   YAG LASER APPLICATION Right 07/12/2014   Procedure: YAG LASER APPLICATION;  Surgeon: Oneil T. Roz, MD;  Location: [facility];  Service: Ophthalmology;  Laterality: Right;   YAG LASER APPLICATION Left 08/15/2015   Procedure: YAG LASER APPLICATION;  Surgeon: Oneil Roz, MD;  Location: [facility];  Service: Ophthalmology;  Laterality: Left;   HPI:  Pt is a 73 y.o. female with  medical history significant of diabetes hypertension, GERD, sleep apnea, stage III chronic kidney disease.  Patient brought to the hospital due to a fall where she states that her legs felt weak before she fell. She did hit her head but had no loss of consciousness. She had some nausea and vomiting and frequency. Pt found to have septic shock from a UTI. An SLE was requested.   Assessment / Plan / Recommendation Clinical Impression  Patient was seen for a speech/language evaluation assessing her voice, speech, resonance, expressive/receptive language, and cognitive communication skills. Pt alert upon SLP arrival, reporting discomfort in her abdomin that felt like she needed to have a bowel movement; SLP alerted NSG. Pt's niece reports no concerns for memory or communication at baseline. States pt's ability to communicate effectively and recall information changed approx 1 week prior to hospitalization. States pt lives with her son and that family manages all iADLs since the pt's husband passed away 4 months ago. Oral mech WNL. During informal evaluation, the pt was frequently upset crying about the loss of her husband. SLP provided comfort and active listening. Pt required repetition to answer open-ended questions but was able to answer y/n questions with 100% acc. Speech intelligibility was 100% in conversation. She followed commands with 100% acc when provided repetition. Voice, motor speech, resonance, and expressive language skills WNL. Pt presents with  deficits in her sustained attention, STM, and topic maintenance. It is important to note that she is currently receiving treatment for severe sepsis 2/2 UTI. Since that can cause AMS and temporary cognitive changes, today's evaluation is likely not representative of her true cognitive linguistic skills. Recommended that pt/family continue to monitor her cognition after her tx for UTI and sepsis and reach out to her MD should confusion continue after it  resolves. Pt and niece verbalized understanding. Pt is not appropriate for ST cog/ling tx due to active sepsis/UTI. No ST services warranted at this time. Recommend referral for grief counseling/therapy.     SLP Assessment  SLP Recommendation/Assessment: Patient does not need any further Speech Language Pathology Services SLP Visit Diagnosis: None     Assistance Recommended at Discharge  Frequent or constant Supervision/Assistance  Functional Status Assessment Patient has had a recent decline in their functional status and demonstrates the ability to make significant improvements in function in a reasonable and predictable amount of time.  Frequency and Duration           SLP Evaluation Cognition  Overall Cognitive Status: Within Functional Limits for tasks assessed Arousal/Alertness: Awake/alert Orientation Level: Oriented to person;Oriented to place;Disoriented to place;Disoriented to time Attention: Sustained Sustained Attention: Impaired Memory: Impaired Memory Impairment: Decreased recall of new information Awareness: Appears intact Problem Solving: Appears intact Behaviors: Lability;Perseveration       Comprehension  Auditory Comprehension Overall Auditory Comprehension: Impaired Yes/No Questions: Within Functional Limits Commands: Within Functional Limits Visual Recognition/Discrimination Discrimination: Not tested Reading Comprehension Reading Status: Not tested    Expression Expression Primary Mode of Expression: Verbal Verbal Expression Overall Verbal Expression: Appears within functional limits for tasks assessed Initiation: No impairment Level of Generative/Spontaneous Verbalization: Conversation Repetition: No impairment Naming: No impairment Pragmatics: Impairment Impairments: Topic maintenance;Topic appropriateness Interfering Components: Other (comment) (severe sepsis and UTI)   Oral / Motor  Oral Motor/Sensory Function Overall Oral Motor/Sensory  Function: Within functional limits Motor Speech Overall Motor Speech: Appears within functional limits for tasks assessed Respiration: Within functional limits Phonation: Normal Resonance: Within functional limits Articulation: Within functional limitis Intelligibility: Intelligible Motor Planning: Within functional limits Motor Speech Errors: Not applicable            Waddell JONETTA Novak, MA CCC-SLP 06/15/2024, 11:13 AM

## 2024-06-15 NOTE — Progress Notes (Addendum)
 PROGRESS NOTE  Sophia [name], is a 73 y.o. female, DOB - [birth year], FMW:[id number]  Admit date - 06/13/2024   Admitting Physician Lang JINNY Peel, DO  Outpatient Primary MD for the patient is Antonetta Rollene BRAVO, MD  LOS - 2  Chief Complaint  Patient presents with   Fall      Brief Narrative:  73 y.o. female with medical history significant of diabetes hypertension, GERD, sleep apnea, stage III chronic kidney disease admitted on 06/13/2024 with severe sepsis with septic shock secondary to E. coli bacteremia UTI    -Assessment and Plan: 1)severe sepsis with septic shock secondary to E. coli bacteremia UTI -Blood cultures from 06/13/2024 with E. Coli---appears not to be ESBL--sensitivities pending 06/15/24 -- Hemodynamics improved - Weaned off IV Levophed  and IV vasopressin  WBC 22.5 >>19.5 >>26.5 - Worsening leukocytosis noted , no further fevers -continue IV Rocephin  pending further culture sensitivities - received IV fluids  2)AKI----acute kidney injury  ---   creatinine on admission= 3.12,  baseline creatinine = 0.9-1.1   ,  -creatinine 3.12 >>1.97>>1.52 -Continue to hold olmesartan /HCTZ and chlorthalidone  -- Renal function improving with hydration and improvement in hemodynamics -- renally adjust medications, avoid nephrotoxic agents / dehydration  / hypotension  3)HTN--- BP stabilized --Continue to hold olmesartan /HCTZ and chlorthalidone  - Weaned off pressors as above in # 1  4)DM2 ----A1c 5.8 reflecting excellent diabetic control PTA Use Novolog /Humalog Sliding scale insulin  with Accu-Cheks/Fingersticks as ordered   5)HFpEF--- chronic diastolic dysfunction CHF - No acute exacerbation - Received IVF as above -hold olmesartan /HCTZ and chlorthalidone  as above  6)GERD--c/n Protonix  and Carafate   7)Depression/Anxiety--continue Prozac  and buspirone   8)HLD--continue Crestor   9)Generalized weakness/deconditioning--- get PT eval  10)Hypokalemia/Hyponatremia--- resolved  with hydration and replacements  Status is: Inpatient   Disposition: The patient is from: Home              Anticipated d/c is to: Home              Anticipated d/c date is: 1 day              Patient currently is not medically stable to d/c. Barriers: Not Clinically Stable-   Code Status :  -  Code Status: Full Code   Family Communication:    NA (patient is alert, awake and coherent)   DVT Prophylaxis  :   - SCDs   enoxaparin  (LOVENOX ) injection 30 mg Start: 06/13/24 2200   Lab Results  Component Value Date   PLT 123 (L) 06/15/2024    Inpatient Medications  Scheduled Meds:  aspirin  EC  81 mg Oral Daily   busPIRone   30 mg Oral BID   Chlorhexidine  Gluconate Cloth  6 each Topical Daily   enoxaparin  (LOVENOX ) injection  30 mg Subcutaneous Q24H   FLUoxetine   40 mg Oral Daily   insulin  aspart  0-15 Units Subcutaneous TID WC   insulin  aspart  0-5 Units Subcutaneous QHS   melatonin  3 mg Oral QHS   methocarbamol   500 mg Oral Once   pantoprazole   40 mg Oral Daily   rosuvastatin   20 mg Oral Daily   sucralfate   1 g Oral BID   Continuous Infusions:  cefTRIAXone  (ROCEPHIN )  IV Stopped (06/14/24 1551)   potassium PHOSPHATE  IVPB (in mmol)     PRN Meds:.acetaminophen  **OR** acetaminophen , ondansetron  **OR** ondansetron  (ZOFRAN ) IV, traZODone    Anti-infectives (From admission, onward)    Start     Dose/Rate Route Frequency Ordered Stop   06/15/24 1800  vancomycin  (VANCOCIN )  IVPB 1000 mg/200 mL premix  Status:  Discontinued        1,000 mg 200 mL/hr over 60 Minutes Intravenous Every 48 hours 06/13/24 2327 06/14/24 1433   06/14/24 1800  ceFEPIme  (MAXIPIME ) 2 g in sodium chloride  0.9 % 100 mL IVPB  Status:  Discontinued        2 g 200 mL/hr over 30 Minutes Intravenous Every 24 hours 06/13/24 2327 06/14/24 1433   06/14/24 1700  cefTRIAXone  (ROCEPHIN ) 1 g in sodium chloride  0.9 % 100 mL IVPB  Status:  Discontinued        1 g 200 mL/hr over 30 Minutes Intravenous Every 24 hours  06/13/24 2015 06/13/24 2209   06/14/24 1600  cefTRIAXone  (ROCEPHIN ) 2 g in sodium chloride  0.9 % 100 mL IVPB        2 g 200 mL/hr over 30 Minutes Intravenous Every 24 hours 06/14/24 1434     06/13/24 1645  ceFEPIme  (MAXIPIME ) 2 g in sodium chloride  0.9 % 100 mL IVPB        2 g 200 mL/hr over 30 Minutes Intravenous  Once 06/13/24 1633 06/13/24 1758   06/13/24 1645  metroNIDAZOLE  (FLAGYL ) IVPB 500 mg        500 mg 100 mL/hr over 60 Minutes Intravenous  Once 06/13/24 1633 06/13/24 1759   06/13/24 1645  vancomycin  (VANCOCIN ) IVPB 1000 mg/200 mL premix        1,000 mg 200 mL/hr over 60 Minutes Intravenous  Once 06/13/24 1633 06/13/24 1802         Subjective: Winton Ned today has no emesis,  No chest pain,   - No further fever  Or chills  Eating well No dyspnea  Objective: Vitals:   06/15/24 0300 06/15/24 0400 06/15/24 0758 06/15/24 0800  BP:  123/71  120/67  Pulse: 69 70 85 83  Resp: 14 (!) 21 20 (!) 22  Temp:  97.7 F (36.5 C) 97.8 F (36.6 C)   TempSrc:  Oral Oral   SpO2: 94% 95% 95% 97%  Weight:      Height:        Intake/Output Summary (Last 24 hours) at 06/15/2024 0942 Last data filed at 06/15/2024 0900 Gross per 24 hour  Intake 1550.1 ml  Output 3300 ml  Net -1749.9 ml   Filed Weights   06/13/24 1552 06/13/24 1946  Weight: 86 kg 90 kg    Physical Exam  Gen:- Awake Alert, in no acute distress HEENT:- [place].AT, No sclera icterus Neck-Supple Neck,No JVD,.  Lungs-  CTAB , fair symmetrical air movement CV- S1, S2 normal, regular  Abd-  +ve B.Sounds, Abd Soft, No tenderness, no CVA area tenderness Extremity/Skin:- No  edema, pedal pulses present  Psych-affect is appropriate, oriented x3 Neuro-generalized weakness, no new focal deficits, no tremors  Data Reviewed: I have personally reviewed following labs and imaging studies  CBC: Recent Labs  Lab 06/13/24 1620 06/14/24 0425 06/15/24 0308  WBC 22.5* 19.5* 26.5*  NEUTROABS 19.6*  --   --   HGB 10.4*  9.7* 9.3*  HCT 33.0* 31.4* 29.2*  MCV 83.1 84.6 82.0  PLT 186 121* 123*   Basic Metabolic Panel: Recent Labs  Lab 06/13/24 1620 06/14/24 0425 06/15/24 0308  NA 133* 132* 141  K 3.3* 2.7* 3.8  CL 93* 97* 106  CO2 24 22 25   GLUCOSE 98 109* 105*  BUN 90* 68* 57*  CREATININE 3.12* 1.97* 1.52*  CALCIUM  9.1 8.0* 8.1*  MG 1.8  --   --  PHOS  --   --  2.0*   GFR: Estimated Creatinine Clearance: 38.8 mL/min (A) (by C-G formula based on SCr of 1.52 mg/dL (H)).  Recent Results (from the past 240 hours)  Resp panel by RT-PCR (RSV, Flu A&B, Covid) Anterior Nasal Swab     Status: None   Collection Time: 06/13/24  4:31 PM   Specimen: Anterior Nasal Swab  Result Value Ref Range Status   SARS Coronavirus 2 by RT PCR NEGATIVE NEGATIVE Final    Comment: (NOTE) SARS-CoV-2 target nucleic acids are NOT DETECTED.  The SARS-CoV-2 RNA is generally detectable in upper respiratory specimens during the acute phase of infection. The lowest concentration of SARS-CoV-2 viral copies this assay can detect is 138 copies/mL. A negative result does not preclude SARS-Cov-2 infection and should not be used as the sole basis for treatment or other patient management decisions. A negative result may occur with  improper specimen collection/handling, submission of specimen other than nasopharyngeal swab, presence of viral mutation(s) within the areas targeted by this assay, and inadequate number of viral copies(<138 copies/mL). A negative result must be combined with clinical observations, patient history, and epidemiological information. The expected result is Negative.  Fact Sheet for Patients:  BloggerCourse.com  Fact Sheet for Healthcare Providers:  SeriousBroker.it  This test is no t yet approved or cleared by the [place]  FDA and  has been authorized for detection and/or diagnosis of SARS-CoV-2 by FDA under an Emergency Use Authorization  (EUA). This EUA will remain  in effect (meaning this test can be used) for the duration of the COVID-19 declaration under Section 564(b)(1) of the Act, 21 U.S.C.section 360bbb-3(b)(1), unless the authorization is terminated  or revoked sooner.       Influenza A by PCR NEGATIVE NEGATIVE Final   Influenza B by PCR NEGATIVE NEGATIVE Final    Comment: (NOTE) The Xpert Xpress SARS-CoV-2/FLU/RSV plus assay is intended as an aid in the diagnosis of influenza from Nasopharyngeal swab specimens and should not be used as a sole basis for treatment. Nasal washings and aspirates are unacceptable for Xpert Xpress SARS-CoV-2/FLU/RSV testing.  Fact Sheet for Patients: BloggerCourse.com  Fact Sheet for Healthcare Providers: SeriousBroker.it  This test is not yet approved or cleared by the [place]  FDA and has been authorized for detection and/or diagnosis of SARS-CoV-2 by FDA under an Emergency Use Authorization (EUA). This EUA will remain in effect (meaning this test can be used) for the duration of the COVID-19 declaration under Section 564(b)(1) of the Act, 21 U.S.C. section 360bbb-3(b)(1), unless the authorization is terminated or revoked.     Resp Syncytial Virus by PCR NEGATIVE NEGATIVE Final    Comment: (NOTE) Fact Sheet for Patients: BloggerCourse.com  Fact Sheet for Healthcare Providers: SeriousBroker.it  This test is not yet approved or cleared by the [place]  FDA and has been authorized for detection and/or diagnosis of SARS-CoV-2 by FDA under an Emergency Use Authorization (EUA). This EUA will remain in effect (meaning this test can be used) for the duration of the COVID-19 declaration under Section 564(b)(1) of the Act, 21 U.S.C. section 360bbb-3(b)(1), unless the authorization is terminated or revoked.  Performed at [facility], [address] [place], [place]  [address]   Blood Culture (routine x 2)     Status: None (Preliminary result)   Collection Time: 06/13/24  5:01 PM   Specimen: Left Antecubital; Blood  Result Value Ref Range Status   Specimen Description   Final    LEFT ANTECUBITAL  BOTTLES DRAWN AEROBIC AND ANAEROBIC Performed at [facility], [address] [place], [place] [address]    Special Requests   Final    Blood Culture adequate volume Performed at [facility], [address] [place], [place] [address]    Culture  Setup Time   Final    GRAM NEGATIVE RODS IN BOTH AEROBIC AND ANAEROBIC BOTTLES Gram Stain Report Called to,Read Back By and Verified With: G ROOS AT 0844 ON [id number] BY  S DALTON GRAM STAIN REVIEWED-AGREE WITH RESULT DRT CRITICAL VALUE NOTED.  VALUE IS CONSISTENT WITH PREVIOUSLY REPORTED AND CALLED VALUE. Performed at [facility], [address] [place], [place] [address]    Culture GRAM NEGATIVE RODS  Final   Report Status PENDING  Incomplete  Blood Culture (routine x 2)     Status: Abnormal (Preliminary result)   Collection Time: 06/13/24  5:01 PM   Specimen: BLOOD LEFT HAND  Result Value Ref Range Status   Specimen Description   Final    BLOOD LEFT HAND AEROBIC BOTTLE ONLY Performed at [facility], [address] [place], [place] [address]    Special Requests   Final    Blood Culture results may not be optimal due to an inadequate volume of blood received in culture bottles Performed at [facility], [address], [place], [place] [address]    Culture  Setup Time   Final    GRAM NEGATIVE RODS AEROBIC BOTTLE ONLY Gram Stain Report Called to,Read Back By and Verified With: A SHELTON AT 0715 ON [id number] BY S DALTON CRITICAL RESULT CALLED TO, READ BACK BY AND VERIFIED WITH: PHARMD STEVEN HURTH ON 06/14/24 @ 1250 BY DRT Performed at [facility], [address] [place], [place] [address]    Culture ESCHERICHIA COLI (A)  Final   Report Status PENDING  Incomplete  Blood Culture ID Panel  (Reflexed)     Status: Abnormal   Collection Time: 06/13/24  5:01 PM  Result Value Ref Range Status   Enterococcus faecalis NOT DETECTED NOT DETECTED Final   Enterococcus Faecium NOT DETECTED NOT DETECTED Final   Listeria monocytogenes NOT DETECTED NOT DETECTED Final   Staphylococcus species NOT DETECTED NOT DETECTED Final   Staphylococcus aureus (BCID) NOT DETECTED NOT DETECTED Final   Staphylococcus epidermidis NOT DETECTED NOT DETECTED Final   Staphylococcus lugdunensis NOT DETECTED NOT DETECTED Final   Streptococcus species NOT DETECTED NOT DETECTED Final   Streptococcus agalactiae NOT DETECTED NOT DETECTED Final   Streptococcus pneumoniae NOT DETECTED NOT DETECTED Final   Streptococcus pyogenes NOT DETECTED NOT DETECTED Final   A.calcoaceticus-baumannii NOT DETECTED NOT DETECTED Final   Bacteroides fragilis NOT DETECTED NOT DETECTED Final   Enterobacterales DETECTED (A) NOT DETECTED Final    Comment: Enterobacterales represent a large order of gram negative bacteria, not a single organism. CRITICAL RESULT CALLED TO, READ BACK BY AND VERIFIED WITH: PHARMD STEVEN HURTH ON 06/14/24 @ 1250 BY DRT    Enterobacter cloacae complex NOT DETECTED NOT DETECTED Final   Escherichia coli DETECTED (A) NOT DETECTED Final    Comment: CRITICAL RESULT CALLED TO, READ BACK BY AND VERIFIED WITH: PHARMD STEVEN HURTH ON 06/14/24 @ 1250 BY DRT    Klebsiella aerogenes NOT DETECTED NOT DETECTED Final   Klebsiella oxytoca NOT DETECTED NOT DETECTED Final   Klebsiella pneumoniae NOT DETECTED NOT DETECTED Final   Proteus species NOT DETECTED NOT DETECTED Final   Salmonella species NOT DETECTED NOT DETECTED Final   Serratia marcescens  NOT DETECTED NOT DETECTED Final   Haemophilus influenzae NOT DETECTED NOT DETECTED Final   Neisseria meningitidis NOT DETECTED NOT DETECTED Final   Pseudomonas aeruginosa NOT DETECTED NOT DETECTED Final   Stenotrophomonas maltophilia NOT DETECTED NOT DETECTED Final   Candida  albicans NOT DETECTED NOT DETECTED Final   Candida auris NOT DETECTED NOT DETECTED Final   Candida glabrata NOT DETECTED NOT DETECTED Final   Candida krusei NOT DETECTED NOT DETECTED Final   Candida parapsilosis NOT DETECTED NOT DETECTED Final   Candida tropicalis NOT DETECTED NOT DETECTED Final   Cryptococcus neoformans/gattii NOT DETECTED NOT DETECTED Final   CTX-M ESBL NOT DETECTED NOT DETECTED Final   Carbapenem resistance IMP NOT DETECTED NOT DETECTED Final   Carbapenem resistance KPC NOT DETECTED NOT DETECTED Final   Carbapenem resistance NDM NOT DETECTED NOT DETECTED Final   Carbapenem resist OXA 48 LIKE NOT DETECTED NOT DETECTED Final   Carbapenem resistance VIM NOT DETECTED NOT DETECTED Final    Comment: Performed at [facility], [address] [place], [place] [address]  MRSA Next Gen by PCR, Nasal     Status: None   Collection Time: 06/13/24  7:54 PM   Specimen: Nasal Mucosa; Nasal Swab  Result Value Ref Range Status   MRSA by PCR Next Gen NOT DETECTED NOT DETECTED Final    Comment: (NOTE) The GeneXpert MRSA Assay (FDA approved for NASAL specimens only), is one component of a comprehensive MRSA colonization surveillance program. It is not intended to diagnose MRSA infection nor to guide or monitor treatment for MRSA infections. Test performance is not FDA approved in patients less than 23 years old. Performed at [facility], [address], [place], [place] [address]     Radiology Studies: CT CHEST ABDOMEN PELVIS WO CONTRAST Result Date: 06/13/2024 CLINICAL DATA:  Sepsis EXAM: CT CHEST, ABDOMEN AND PELVIS WITHOUT CONTRAST TECHNIQUE: Multidetector CT imaging of the chest, abdomen and pelvis was performed following the standard protocol without IV contrast. RADIATION DOSE REDUCTION: This exam was performed according to the departmental dose-optimization program which includes automated exposure control, adjustment of the mA and/or kV according to patient size and/or use  of iterative reconstruction technique. COMPARISON:  PET CT 01/09/2024. FINDINGS: CT CHEST FINDINGS Cardiovascular: No significant vascular findings. Normal heart size. No pericardial effusion. There are atherosclerotic calcifications of the aorta. Mediastinum/Nodes: No enlarged mediastinal, hilar, or axillary lymph nodes. Thyroid  gland, trachea, and esophagus demonstrate no significant findings. Lungs/Pleura: Patient is status post resection of right lower lobe nodule. There is some atelectasis along the staple line with trace right pleural effusion. There is a 2 mm right lower lobe nodule image 4/73 which is not definitely seen on prior PET-CT, although this may be related to differences in technique. Mild emphysema again noted. Musculoskeletal: No chest wall mass or suspicious bone lesions identified. CT ABDOMEN PELVIS FINDINGS Hepatobiliary: No focal liver abnormality is seen. No gallstones, gallbladder wall thickening, or biliary dilatation. Pancreas: Unremarkable. No pancreatic ductal dilatation or surrounding inflammatory changes. Spleen: Normal in size without focal abnormality. Adrenals/Urinary Tract: Adrenal glands are unremarkable. Kidneys are normal, without renal calculi, focal lesion, or hydronephrosis. Bladder is unremarkable. Stomach/Bowel: Stomach is within normal limits. Appendix is not seen. No evidence of bowel wall thickening, distention, or inflammatory changes. Vascular/Lymphatic: Aortic atherosclerosis. No enlarged abdominal or pelvic lymph nodes. Reproductive: Status post hysterectomy. No adnexal masses. Other: No abdominal wall hernia or abnormality. No abdominopelvic ascites. Musculoskeletal: No acute or significant osseous findings. IMPRESSION: 1. Status post  resection of right lower lobe nodule. There is some atelectasis along the staple line with trace right pleural effusion. 2. 2 mm right lower lobe nodule is not definitely seen on prior PET-CT, although this may be related to  differences in technique. 3. No acute localizing process in the abdomen or pelvis. Aortic Atherosclerosis (ICD10-I70.0) and Emphysema (ICD10-J43.9). Electronically Signed   By: Greig Pique M.D.   On: 06/13/2024 18:12   CT Cervical Spine Wo Contrast Result Date: 06/13/2024 CLINICAL DATA:  Fall.  Blunt head and neck trauma. EXAM: CT CERVICAL SPINE WITHOUT CONTRAST TECHNIQUE: Multidetector CT imaging of the cervical spine was performed without intravenous contrast. Multiplanar CT image reconstructions were also generated. RADIATION DOSE REDUCTION: This exam was performed according to the departmental dose-optimization program which includes automated exposure control, adjustment of the mA and/or kV according to patient size and/or use of iterative reconstruction technique. COMPARISON:  None Available. FINDINGS: Alignment: Normal. Skull base and vertebrae: No acute fracture. No primary bone lesion or focal pathologic process. Soft tissues and spinal canal: No prevertebral fluid or swelling. No visible canal hematoma. Disc levels: No disc space narrowing. Moderate bilateral facet DJD is seen at C3-4 and C4-5. Atlantoaxial degenerative changes also noted. Upper chest: No acute findings. Other: None. IMPRESSION: No evidence of cervical spine fracture or subluxation. Degenerative cervical spondylosis, as described above. Electronically Signed   By: Norleen DELENA Kil M.D.   On: 06/13/2024 16:53   DG Chest Portable 1 View Result Date: 06/13/2024 CLINICAL DATA:  Recent fall.  Hypotension. EXAM: PORTABLE CHEST 1 VIEW COMPARISON:  05/11/2024 FINDINGS: The heart size and mediastinal contours are within normal limits. Both lungs are clear. The visualized skeletal structures are unremarkable. IMPRESSION: No active disease. Electronically Signed   By: Norleen DELENA Kil M.D.   On: 06/13/2024 16:50   CT Head Wo Contrast Result Date: 06/13/2024 CLINICAL DATA:  Fall last night with blunt head trauma. Headache and weakness. EXAM: CT  HEAD WITHOUT CONTRAST TECHNIQUE: Contiguous axial images were obtained from the base of the skull through the vertex without intravenous contrast. RADIATION DOSE REDUCTION: This exam was performed according to the departmental dose-optimization program which includes automated exposure control, adjustment of the mA and/or kV according to patient size and/or use of iterative reconstruction technique. COMPARISON:  None Available. FINDINGS: Brain: No evidence of intracranial hemorrhage, acute infarction, hydrocephalus, extra-axial collection, or mass lesion/mass effect. Vascular:  No hyperdense vessel or other acute findings. Skull: No evidence of fracture or other significant bone abnormality. Sinuses/Orbits:  No acute findings. Other: None. IMPRESSION: Negative noncontrast head CT. Electronically Signed   By: Norleen DELENA Kil M.D.   On: 06/13/2024 16:49   Scheduled Meds:  aspirin  EC  81 mg Oral Daily   busPIRone   30 mg Oral BID   Chlorhexidine  Gluconate Cloth  6 each Topical Daily   enoxaparin  (LOVENOX ) injection  30 mg Subcutaneous Q24H   FLUoxetine   40 mg Oral Daily   insulin  aspart  0-15 Units Subcutaneous TID WC   insulin  aspart  0-5 Units Subcutaneous QHS   melatonin  3 mg Oral QHS   methocarbamol   500 mg Oral Once   pantoprazole   40 mg Oral Daily   rosuvastatin   20 mg Oral Daily   sucralfate   1 g Oral BID  Continuous Infusions:  cefTRIAXone  (ROCEPHIN )  IV Stopped (06/14/24 1551)   potassium PHOSPHATE  IVPB (in mmol)      LOS: 2 days   Rendall Carwin M.D on 06/15/2024 at 9:42 AM  Go  to [email or web address] - for contact info  [facility] - Office  [phone]  If 7PM-7AM, please contact night-coverage [email or web address] 06/15/2024, 9:42 AM

## 2024-06-15 NOTE — Evaluation (Signed)
 Physical Therapy Evaluation Patient Details Name: Sophia [name] MRN: [id number] DOB: 1951/05/29 Today's Date: 06/15/2024  History of Present Illness  Sophia [name] is a 73 y.o. female with medical history significant of diabetes hypertension, GERD, sleep apnea, stage III chronic kidney disease.  Patient brought to the hospital due to a fall that occurred last night.  She states that her legs felt weak before she fell.  She did hit her head but had no loss of consciousness.  She had some nausea and vomiting and frequency.  She was recently started on Farxiga  about 6 days ago.  Here, she was noted to have a low blood pressure.  She was started on broad-spectrum antibiotics and code sepsis was called.  She was bolused with IV fluids which helped with her blood pressure.  Her white count was noted to be 22.5 and was noted to have a UTI.  Denies flank pain, nausea, vomiting, shortness of breath.  After the antibiotics, she was noted to have a fever and shaking chills.   Clinical Impression  Patient agreeable to and tolerated PT evaluation well.  Patient was mod Independent with bed mobility and required CGA during functional transfers (STS from bed, recliner, and commode), and ambulation with RW. Patient independent with pericare in standing with support of RW. Family present during evaluation and confirm they are available 24/7 for physical assistance if needed. Reports 2 falls in last 6 months. Pt demo general strength deficits, dec activity tolerance, and imbalance, all of which she would benefit from receiving skilled PT services acutely and in recommended venue to address.       If plan is discharge home, recommend the following: A little help with walking and/or transfers;A little help with bathing/dressing/bathroom;Assist for transportation;Help with stairs or ramp for entrance   Can travel by private vehicle        Equipment Recommendations None recommended by PT  Recommendations for Other  Services       Functional Status Assessment Patient has had a recent decline in their functional status and demonstrates the ability to make significant improvements in function in a reasonable and predictable amount of time.     Precautions / Restrictions Precautions Precautions: Fall Recall of Precautions/Restrictions: Intact Restrictions Weight Bearing Restrictions Per Provider Order: No      Mobility  Bed Mobility Overal bed mobility: Modified Independent     General bed mobility comments: HOB flat, use of railing, inc time needed due to pain and fatigue    Transfers Overall transfer level: Needs assistance Equipment used: None Transfers: Sit to/from Stand, Bed to chair/wheelchair/BSC Sit to Stand: Contact guard assist   Step pivot transfers: Contact guard assist       General transfer comment: No AD during initial STS and bed>chair, CGA for safety as pt reports feeling weak. Transient dizziness once seated in recliner.    Ambulation/Gait Ambulation/Gait assistance: Contact guard assist Gait Distance (Feet): 30 Feet Assistive device: Rolling walker (2 wheels) Gait Pattern/deviations: Step-through pattern, Decreased step length - right, Decreased step length - left, Knees buckling, Decreased stride length, Trunk flexed Gait velocity: Dec     General Gait Details: RW used for chair<>commode, pt mildly unsteady and reports feeling weak, CGA during for safety but no overt LOB, very mild buckling noted bilat. very brief standing breaks  Stairs      Wheelchair Mobility     Tilt Bed    Modified Rankin (Stroke Patients Only)       Balance Overall balance assessment:  Needs assistance Sitting-balance support: Feet supported, No upper extremity supported Sitting balance-Leahy Scale: Good Sitting balance - Comments: seated EOB and in recliner   Standing balance support: During functional activity, Bilateral upper extremity supported Standing balance-Leahy  Scale: Fair Standing balance comment: pt mildly unsteady w/ and w/o RW, may be due to fatigue/weakness.           Pertinent Vitals/Pain Pain Assessment Pain Assessment: 0-10 Pain Score: 1  Pain Location: L side of abdomen and back of head Pain Descriptors / Indicators: Aching Pain Intervention(s): Monitored during session, Repositioned, Limited activity within patient's tolerance    Home Living Family/patient expects to be discharged to:: Private residence Living Arrangements: Children;Other relatives Available Help at Discharge: Family;Available 24 hours/day Type of Home: House Home Access: Level entry       Home Layout: Two level;Full bath on main level;Able to live on main level with bedroom/bathroom Home Equipment: Grab bars - tub/shower;Grab bars - toilet;Shower seat;BSC/3in1;Rolling Environmental consultant (2 wheels);Wheelchair - manual;Cane - single point Additional Comments: Reports her children live with her since her husband passed away a few months ago.    Prior Function Prior Level of Function : Independent/Modified Independent             Mobility Comments: Reports use of cane most for mobility. reports she was not using her cane when she fell. Reports 2 falls in last 6 months. ADLs Comments: reports independence with all ADL and iADLs.     Extremity/Trunk Assessment   Upper Extremity Assessment Upper Extremity Assessment: Overall WFL for tasks assessed;Generalized weakness    Lower Extremity Assessment Lower Extremity Assessment: Overall WFL for tasks assessed;Generalized weakness (Ankle DF 4+/5. Hip flexion MMT 4-/5 and pt reports inc pain, L>R)    Cervical / Trunk Assessment Cervical / Trunk Assessment: Kyphotic  Communication   Communication Communication: No apparent difficulties    Cognition Arousal: Alert Behavior During Therapy: WFL for tasks assessed/performed   PT - Cognitive impairments: No apparent impairments         Following commands:  Intact       Cueing Cueing Techniques: Verbal cues, Tactile cues, Visual cues     General Comments      Exercises     Assessment/Plan    PT Assessment Patient needs continued PT services;All further PT needs can be met in the next venue of care  PT Problem List Decreased strength;Decreased activity tolerance;Decreased balance;Pain       PT Treatment Interventions DME instruction;Gait training;Stair training;Functional mobility training;Therapeutic activities;Therapeutic exercise;Patient/family education;Balance training    PT Goals (Current goals can be found in the Care Plan section)  Acute Rehab PT Goals Patient Stated Goal: Return home PT Goal Formulation: With patient/family Time For Goal Achievement: 06/18/24 Potential to Achieve Goals: Good    Frequency Min 3X/week     Co-evaluation               AM-PAC PT 6 Clicks Mobility  Outcome Measure Help needed turning from your back to your side while in a flat bed without using bedrails?: None Help needed moving from lying on your back to sitting on the side of a flat bed without using bedrails?: A Little Help needed moving to and from a bed to a chair (including a wheelchair)?: A Little Help needed standing up from a chair using your arms (e.g., wheelchair or bedside chair)?: A Little Help needed to walk in hospital room?: A Little Help needed climbing 3-5 steps with a railing? : A Little  6 Click Score: 19    End of Session Equipment Utilized During Treatment: Gait belt Activity Tolerance: Patient tolerated treatment well;Patient limited by fatigue Patient left: in chair;with call bell/phone within reach;with family/visitor present   PT Visit Diagnosis: Unsteadiness on feet (R26.81);History of falling (Z91.81);Muscle weakness (generalized) (M62.81);Other abnormalities of gait and mobility (R26.89)    Time: 8564-8495 PT Time Calculation (min) (ACUTE ONLY): 29 min   Charges:   PT Evaluation $PT Eval  Moderate Complexity: 1 Mod PT Treatments $Therapeutic Activity: 23-37 mins PT General Charges $$ ACUTE PT VISIT: 1 Visit        4:20 PM, 06/15/24 Ahyan Kreeger Powell-Butler, PT, DPT [place] with [facility]

## 2024-06-16 DIAGNOSIS — N1831 Chronic kidney disease, stage 3a: Secondary | ICD-10-CM

## 2024-06-16 DIAGNOSIS — I10 Essential (primary) hypertension: Secondary | ICD-10-CM

## 2024-06-16 DIAGNOSIS — E1169 Type 2 diabetes mellitus with other specified complication: Secondary | ICD-10-CM | POA: Diagnosis not present

## 2024-06-16 DIAGNOSIS — A419 Sepsis, unspecified organism: Secondary | ICD-10-CM | POA: Diagnosis not present

## 2024-06-16 LAB — RENAL FUNCTION PANEL
Albumin: 2.7 g/dL — ABNORMAL LOW (ref 3.5–5.0)
Anion gap: 11 (ref 5–15)
BUN: 38 mg/dL — ABNORMAL HIGH (ref 8–23)
CO2: 26 mmol/L (ref 22–32)
Calcium: 8.4 mg/dL — ABNORMAL LOW (ref 8.9–10.3)
Chloride: 100 mmol/L (ref 98–111)
Creatinine, Ser: 1.2 mg/dL — ABNORMAL HIGH (ref 0.44–1.00)
GFR, Estimated: 48 mL/min — ABNORMAL LOW (ref >60)
Glucose, Bld: 94 mg/dL (ref 70–99)
Phosphorus: 2.1 mg/dL — ABNORMAL LOW (ref 2.5–4.6)
Potassium: 3.3 mmol/L — ABNORMAL LOW (ref 3.5–5.1)
Sodium: 137 mmol/L (ref 135–145)

## 2024-06-16 LAB — CULTURE, BLOOD (ROUTINE X 2): Special Requests: ADEQUATE

## 2024-06-16 LAB — URINE CULTURE: Culture: 20000 — AB

## 2024-06-16 LAB — GLUCOSE, CAPILLARY
Glucose-Capillary: 90 mg/dL (ref 70–99)
Glucose-Capillary: 101 mg/dL — ABNORMAL HIGH (ref 70–99)
Glucose-Capillary: 122 mg/dL — ABNORMAL HIGH (ref 70–99)
Glucose-Capillary: 88 mg/dL (ref 70–99)

## 2024-06-16 LAB — CBC
HCT: 30.3 % — ABNORMAL LOW (ref 36.0–46.0)
Hemoglobin: 9.7 g/dL — ABNORMAL LOW (ref 12.0–15.0)
MCH: 25.9 pg — ABNORMAL LOW (ref 26.0–34.0)
MCHC: 32 g/dL (ref 30.0–36.0)
MCV: 81 fL (ref 80.0–100.0)
Platelets: 137 K/uL — ABNORMAL LOW (ref 150–400)
RBC: 3.74 MIL/uL — ABNORMAL LOW (ref 3.87–5.11)
RDW: 15.3 % (ref 11.5–15.5)
WBC: 17.6 K/uL — ABNORMAL HIGH (ref 4.0–10.5)
nRBC: 0 % (ref 0.0–0.2)

## 2024-06-16 LAB — MAGNESIUM: Magnesium: 2 mg/dL (ref 1.7–2.4)

## 2024-06-16 MED ORDER — POTASSIUM CHLORIDE CRYS ER 20 MEQ PO TBCR
40.0000 meq | EXTENDED_RELEASE_TABLET | Freq: Once | ORAL | Status: AC
  Administered 2024-06-16: 40 meq via ORAL
  Filled 2024-06-16: qty 2

## 2024-06-16 NOTE — Progress Notes (Signed)
 PROGRESS NOTE  Sophia [name], is a 73 y.o. female, DOB - 12/30/1950, FMW:[id number]  Admit date - 06/13/2024   Admitting Physician Lang JINNY Peel, DO  Outpatient Primary MD for the patient is Antonetta Rollene BRAVO, MD  LOS - 3  Chief Complaint  Patient presents with   Fall      Brief Narrative:  73 y.o. female with medical history significant of diabetes hypertension, GERD, sleep apnea, stage III chronic kidney disease admitted on 06/13/2024 with severe sepsis with septic shock secondary to E. coli bacteremia UTI    -Assessment and Plan: 1)severe sepsis with septic shock secondary to E. coli bacteremia UTI -Blood cultures from 06/13/2024 with E. Coli---appears not to be ESBL--sensitivities pending 06/15/24 -- Hemodynamics improved - Weaned off IV Levophed  and IV vasopressin  WBC 22.5 >>19.5 >>26.5>>17.6 -continue IV Rocephin  pending further culture sensitivities - Maintain adequate hydration.  2)AKI----acute kidney injury  ---   creatinine on admission= 3.12,  baseline creatinine = 0.9-1.1   ,  -creatinine 3.12 >>1.97>>1.52>>1.20 -Continue to hold olmesartan /HCTZ and chlorthalidone  -- Renal function improving with hydration and improvement in hemodynamics -- renally adjust medications, avoid nephrotoxic agents / dehydration  / hypotension  3)HTN--- BP stabilized --Continue to hold olmesartan /HCTZ and chlorthalidone  - Weaned off pressors as above in # 1  4)DM2 ----A1c 5.8 reflecting excellent diabetic control PTA Use Novolog /Humalog Sliding scale insulin  with  - Continue to follow CBG fluctuation.  5)HFpEF--- chronic diastolic dysfunction CHF - No acute exacerbation appreciated. - Received IVF as mentioned above - Continue to hold olmesartan /HCTZ and chlorthalidone  as above  6)GERD--c/n Protonix  and Carafate   7)Depression/Anxiety--continue Prozac  and buspirone   8)HLD--continue Crestor   9)Generalized weakness/deconditioning--- get PT eval  10)Hypokalemia/Hyponatremia---  resolved with hydration and replacements  Status is: Inpatient   Disposition: The patient is from: Home              Anticipated d/c is to: Home              Anticipated d/c date is: 1 day              Patient currently is not medically stable to d/c. Barriers: Not Clinically Stable-   Code Status :  -  Code Status: Full Code   Family Communication:    NA (patient is alert, awake and coherent)   DVT Prophylaxis  :   - SCDs   enoxaparin  (LOVENOX ) injection 40 mg Start: 06/15/24 2200   Lab Results  Component Value Date   PLT 137 (L) 06/16/2024    Inpatient Medications  Scheduled Meds:  aspirin  EC  81 mg Oral Daily   busPIRone   30 mg Oral BID   enoxaparin  (LOVENOX ) injection  40 mg Subcutaneous Q24H   FLUoxetine   40 mg Oral Daily   insulin  aspart  0-15 Units Subcutaneous TID WC   insulin  aspart  0-5 Units Subcutaneous QHS   melatonin  3 mg Oral QHS   methocarbamol   500 mg Oral Once   pantoprazole   40 mg Oral Daily   polyethylene glycol  17 g Oral Daily   rosuvastatin   20 mg Oral Daily   senna-docusate  2 tablet Oral QHS   sucralfate   1 g Oral BID   Continuous Infusions:  cefTRIAXone  (ROCEPHIN )  IV Stopped (06/15/24 1856)   PRN Meds:.acetaminophen  **OR** acetaminophen , ALPRAZolam , ondansetron  **OR** ondansetron  (ZOFRAN ) IV, traZODone    Anti-infectives (From admission, onward)    Start     Dose/Rate Route Frequency Ordered Stop   06/15/24 1800  vancomycin  (VANCOCIN ) IVPB  1000 mg/200 mL premix  Status:  Discontinued        1,000 mg 200 mL/hr over 60 Minutes Intravenous Every 48 hours 06/13/24 2327 06/14/24 1433   06/14/24 1800  ceFEPIme  (MAXIPIME ) 2 g in sodium chloride  0.9 % 100 mL IVPB  Status:  Discontinued        2 g 200 mL/hr over 30 Minutes Intravenous Every 24 hours 06/13/24 2327 06/14/24 1433   06/14/24 1700  cefTRIAXone  (ROCEPHIN ) 1 g in sodium chloride  0.9 % 100 mL IVPB  Status:  Discontinued        1 g 200 mL/hr over 30 Minutes Intravenous Every 24 hours  06/13/24 2015 06/13/24 2209   06/14/24 1600  cefTRIAXone  (ROCEPHIN ) 2 g in sodium chloride  0.9 % 100 mL IVPB        2 g 200 mL/hr over 30 Minutes Intravenous Every 24 hours 06/14/24 1434     06/13/24 1645  ceFEPIme  (MAXIPIME ) 2 g in sodium chloride  0.9 % 100 mL IVPB        2 g 200 mL/hr over 30 Minutes Intravenous  Once 06/13/24 1633 06/13/24 1758   06/13/24 1645  metroNIDAZOLE  (FLAGYL ) IVPB 500 mg        500 mg 100 mL/hr over 60 Minutes Intravenous  Once 06/13/24 1633 06/13/24 1759   06/13/24 1645  vancomycin  (VANCOCIN ) IVPB 1000 mg/200 mL premix        1,000 mg 200 mL/hr over 60 Minutes Intravenous  Once 06/13/24 1633 06/13/24 1802         Subjective: Winton Ned expressed feeling tired; no chest pain, no nausea, no vomiting, no fever.  No overnight events.  Objective: Vitals:   06/15/24 2042 06/16/24 0146 06/16/24 0425 06/16/24 1345  BP: 119/78 124/76 104/70 115/74  Pulse: 75 72 75 72  Resp: 17 18  20   Temp: 98.1 F (36.7 C) (!) 97.5 F (36.4 C) 98.7 F (37.1 C) 99.1 F (37.3 C)  TempSrc: Oral Oral Oral Oral  SpO2: 95% 92% 100% 95%  Weight:      Height:        Intake/Output Summary (Last 24 hours) at 06/16/2024 1625 Last data filed at 06/16/2024 0900 Gross per 24 hour  Intake 823.69 ml  Output 800 ml  Net 23.69 ml   Filed Weights   06/13/24 1552 06/13/24 1946  Weight: 86 kg 90 kg    Physical Exam General exam: Alert, awake, oriented x 3; reports feeling tired.  No fever, no chest pain, no shortness of breath. Respiratory system: Clear to auscultation. Respiratory effort normal.  Good saturation on room air.  Not using accessory muscles. Cardiovascular system:RRR. No murmurs, rubs, gallops. Gastrointestinal system: Abdomen is nondistended, soft and nontender. No organomegaly or masses felt. Normal bowel sounds heard. Central nervous system: Alert and oriented. No focal neurological deficits. Extremities: No C/C/E, +pedal pulses Skin: No rashes, lesions or  ulcers Psychiatry: Judgement and insight appear normal. Mood & affect appropriate.    Data Reviewed: I have personally reviewed following labs and imaging studies  CBC: Recent Labs  Lab 06/13/24 1620 06/14/24 0425 06/15/24 0308 06/16/24 0422  WBC 22.5* 19.5* 26.5* 17.6*  NEUTROABS 19.6*  --   --   --   HGB 10.4* 9.7* 9.3* 9.7*  HCT 33.0* 31.4* 29.2* 30.3*  MCV 83.1 84.6 82.0 81.0  PLT 186 121* 123* 137*   Basic Metabolic Panel: Recent Labs  Lab 06/13/24 1620 06/14/24 0425 06/15/24 0308 06/16/24 0422  NA 133* 132* 141 137  K 3.3* 2.7* 3.8 3.3*  CL 93* 97* 106 100  CO2 24 22 25 26   GLUCOSE 98 109* 105* 94  BUN 90* 68* 57* 38*  CREATININE 3.12* 1.97* 1.52* 1.20*  CALCIUM  9.1 8.0* 8.1* 8.4*  MG 1.8  --   --  2.0  PHOS  --   --  2.0* 2.1*   GFR: Estimated Creatinine Clearance: 49.2 mL/min (A) (by C-G formula based on SCr of 1.2 mg/dL (H)).  Recent Results (from the past 240 hours)  Resp panel by RT-PCR (RSV, Flu A&B, Covid) Anterior Nasal Swab     Status: None   Collection Time: 06/13/24  4:31 PM   Specimen: Anterior Nasal Swab  Result Value Ref Range Status   SARS Coronavirus 2 by RT PCR NEGATIVE NEGATIVE Final    Comment: (NOTE) SARS-CoV-2 target nucleic acids are NOT DETECTED.  The SARS-CoV-2 RNA is generally detectable in upper respiratory specimens during the acute phase of infection. The lowest concentration of SARS-CoV-2 viral copies this assay can detect is 138 copies/mL. A negative result does not preclude SARS-Cov-2 infection and should not be used as the sole basis for treatment or other patient management decisions. A negative result may occur with  improper specimen collection/handling, submission of specimen other than nasopharyngeal swab, presence of viral mutation(s) within the areas targeted by this assay, and inadequate number of viral copies(<138 copies/mL). A negative result must be combined with clinical observations, patient history, and  epidemiological information. The expected result is Negative.  Fact Sheet for Patients:  BloggerCourse.com  Fact Sheet for Healthcare Providers:  SeriousBroker.it  This test is no t yet approved or cleared by the [place]  FDA and  has been authorized for detection and/or diagnosis of SARS-CoV-2 by FDA under an Emergency Use Authorization (EUA). This EUA will remain  in effect (meaning this test can be used) for the duration of the COVID-19 declaration under Section 564(b)(1) of the Act, 21 U.S.C.section 360bbb-3(b)(1), unless the authorization is terminated  or revoked sooner.       Influenza A by PCR NEGATIVE NEGATIVE Final   Influenza B by PCR NEGATIVE NEGATIVE Final    Comment: (NOTE) The Xpert Xpress SARS-CoV-2/FLU/RSV plus assay is intended as an aid in the diagnosis of influenza from Nasopharyngeal swab specimens and should not be used as a sole basis for treatment. Nasal washings and aspirates are unacceptable for Xpert Xpress SARS-CoV-2/FLU/RSV testing.  Fact Sheet for Patients: BloggerCourse.com  Fact Sheet for Healthcare Providers: SeriousBroker.it  This test is not yet approved or cleared by the [place]  FDA and has been authorized for detection and/or diagnosis of SARS-CoV-2 by FDA under an Emergency Use Authorization (EUA). This EUA will remain in effect (meaning this test can be used) for the duration of the COVID-19 declaration under Section 564(b)(1) of the Act, 21 U.S.C. section 360bbb-3(b)(1), unless the authorization is terminated or revoked.     Resp Syncytial Virus by PCR NEGATIVE NEGATIVE Final    Comment: (NOTE) Fact Sheet for Patients: BloggerCourse.com  Fact Sheet for Healthcare Providers: SeriousBroker.it  This test is not yet approved or cleared by the [place]  FDA and has been  authorized for detection and/or diagnosis of SARS-CoV-2 by FDA under an Emergency Use Authorization (EUA). This EUA will remain in effect (meaning this test can be used) for the duration of the COVID-19 declaration under Section 564(b)(1) of the Act, 21 U.S.C. section 360bbb-3(b)(1), unless the authorization is terminated or revoked.  Performed at [facility],  [address] [place], [place] [address]   Blood Culture (routine x 2)     Status: Abnormal   Collection Time: 06/13/24  5:01 PM   Specimen: Left Antecubital; Blood  Result Value Ref Range Status   Specimen Description   Final    LEFT ANTECUBITAL BOTTLES DRAWN AEROBIC AND ANAEROBIC Performed at [facility], [address] [place], [place] [address]    Special Requests   Final    Blood Culture adequate volume Performed at [facility], [address], [place], [place] [address]    Culture  Setup Time   Final    GRAM NEGATIVE RODS IN BOTH AEROBIC AND ANAEROBIC BOTTLES Gram Stain Report Called to,Read Back By and Verified With: G ROOS AT 0844 ON [id number] BY  S DALTON GRAM STAIN REVIEWED-AGREE WITH RESULT DRT CRITICAL VALUE NOTED.  VALUE IS CONSISTENT WITH PREVIOUSLY REPORTED AND CALLED VALUE.    Culture (A)  Final    ESCHERICHIA COLI SUSCEPTIBILITIES PERFORMED ON PREVIOUS CULTURE WITHIN THE LAST 5 DAYS. Performed at [facility], [address] [place], [place] [address]    Report Status 06/16/2024 FINAL  Final  Blood Culture (routine x 2)     Status: Abnormal   Collection Time: 06/13/24  5:01 PM   Specimen: BLOOD LEFT HAND  Result Value Ref Range Status   Specimen Description   Final    BLOOD LEFT HAND AEROBIC BOTTLE ONLY Performed at [facility], [address], [place], [place] [address]    Special Requests   Final    Blood Culture results may not be optimal due to an inadequate volume of blood received in culture bottles Performed at [facility], [address], [place], [place] [address]    Culture   Setup Time   Final    GRAM NEGATIVE RODS AEROBIC BOTTLE ONLY Gram Stain Report Called to,Read Back By and Verified With: A SHELTON AT 0715 ON [id number] BY S DALTON CRITICAL RESULT CALLED TO, READ BACK BY AND VERIFIED WITH: PHARMD STEVEN HURTH ON 06/14/24 @ 1250 BY DRT Performed at [facility], [address], [place], [place] [address]    Culture ESCHERICHIA COLI (A)  Final   Report Status 06/16/2024 FINAL  Final   Organism ID, Bacteria ESCHERICHIA COLI  Final   Organism ID, Bacteria ESCHERICHIA COLI  Final      Susceptibility   Escherichia coli - KIRBY BAUER*    CEFAZOLIN  INTERMEDIATE Intermediate    Escherichia coli - MIC*    AMPICILLIN >=32 RESISTANT Resistant     CEFEPIME  <=0.12 SENSITIVE Sensitive     CEFTAZIDIME <=1 SENSITIVE Sensitive     CEFTRIAXONE  <=0.25 SENSITIVE Sensitive     CIPROFLOXACIN  1 RESISTANT Resistant     GENTAMICIN <=1 SENSITIVE Sensitive     IMIPENEM <=0.25 SENSITIVE Sensitive     TRIMETH /SULFA  >=320 RESISTANT Resistant     AMPICILLIN/SULBACTAM 8 SENSITIVE Sensitive     PIP/TAZO <=4 SENSITIVE Sensitive ug/mL    * ESCHERICHIA COLI    ESCHERICHIA COLI  Blood Culture ID Panel (Reflexed)     Status: Abnormal   Collection Time: 06/13/24  5:01 PM  Result Value Ref Range Status   Enterococcus faecalis NOT DETECTED NOT DETECTED Final   Enterococcus Faecium NOT DETECTED NOT DETECTED Final   Listeria monocytogenes NOT DETECTED NOT DETECTED Final   Staphylococcus species NOT DETECTED NOT DETECTED Final   Staphylococcus aureus (BCID) NOT DETECTED NOT DETECTED Final   Staphylococcus epidermidis NOT DETECTED NOT DETECTED  Final   Staphylococcus lugdunensis NOT DETECTED NOT DETECTED Final   Streptococcus species NOT DETECTED NOT DETECTED Final   Streptococcus agalactiae NOT DETECTED NOT DETECTED Final   Streptococcus pneumoniae NOT DETECTED NOT DETECTED Final   Streptococcus pyogenes NOT DETECTED NOT DETECTED Final   A.calcoaceticus-baumannii NOT DETECTED NOT  DETECTED Final   Bacteroides fragilis NOT DETECTED NOT DETECTED Final   Enterobacterales DETECTED (A) NOT DETECTED Final    Comment: Enterobacterales represent a large order of gram negative bacteria, not a single organism. CRITICAL RESULT CALLED TO, READ BACK BY AND VERIFIED WITH: PHARMD STEVEN HURTH ON 06/14/24 @ 1250 BY DRT    Enterobacter cloacae complex NOT DETECTED NOT DETECTED Final   Escherichia coli DETECTED (A) NOT DETECTED Final    Comment: CRITICAL RESULT CALLED TO, READ BACK BY AND VERIFIED WITH: PHARMD STEVEN HURTH ON 06/14/24 @ 1250 BY DRT    Klebsiella aerogenes NOT DETECTED NOT DETECTED Final   Klebsiella oxytoca NOT DETECTED NOT DETECTED Final   Klebsiella pneumoniae NOT DETECTED NOT DETECTED Final   Proteus species NOT DETECTED NOT DETECTED Final   Salmonella species NOT DETECTED NOT DETECTED Final   Serratia marcescens NOT DETECTED NOT DETECTED Final   Haemophilus influenzae NOT DETECTED NOT DETECTED Final   Neisseria meningitidis NOT DETECTED NOT DETECTED Final   Pseudomonas aeruginosa NOT DETECTED NOT DETECTED Final   Stenotrophomonas maltophilia NOT DETECTED NOT DETECTED Final   Candida albicans NOT DETECTED NOT DETECTED Final   Candida auris NOT DETECTED NOT DETECTED Final   Candida glabrata NOT DETECTED NOT DETECTED Final   Candida krusei NOT DETECTED NOT DETECTED Final   Candida parapsilosis NOT DETECTED NOT DETECTED Final   Candida tropicalis NOT DETECTED NOT DETECTED Final   Cryptococcus neoformans/gattii NOT DETECTED NOT DETECTED Final   CTX-M ESBL NOT DETECTED NOT DETECTED Final   Carbapenem resistance IMP NOT DETECTED NOT DETECTED Final   Carbapenem resistance KPC NOT DETECTED NOT DETECTED Final   Carbapenem resistance NDM NOT DETECTED NOT DETECTED Final   Carbapenem resist OXA 48 LIKE NOT DETECTED NOT DETECTED Final   Carbapenem resistance VIM NOT DETECTED NOT DETECTED Final    Comment: Performed at [facility], [address], [place],  [place] [address]  Urine Culture     Status: Abnormal   Collection Time: 06/13/24  5:27 PM   Specimen: Urine, Clean Catch  Result Value Ref Range Status   Specimen Description   Final    URINE, CLEAN CATCH Performed at [facility], [address] [place], [place] [address]    Special Requests   Final    NONE Performed at [facility], [address], [place], [place] [address]    Culture 20,000 COLONIES/mL ESCHERICHIA COLI (A)  Final   Report Status 06/16/2024 FINAL  Final   Organism ID, Bacteria ESCHERICHIA COLI (A)  Final      Susceptibility   Escherichia coli - MIC*    AMPICILLIN >=32 RESISTANT Resistant     CEFAZOLIN  <=4 SENSITIVE Sensitive     CEFEPIME  <=0.12 SENSITIVE Sensitive     CEFTRIAXONE  <=0.25 SENSITIVE Sensitive     CIPROFLOXACIN  1 RESISTANT Resistant     GENTAMICIN <=1 SENSITIVE Sensitive     IMIPENEM <=0.25 SENSITIVE Sensitive     NITROFURANTOIN <=16 SENSITIVE Sensitive     TRIMETH /SULFA  >=320 RESISTANT Resistant     AMPICILLIN/SULBACTAM 4 SENSITIVE Sensitive     PIP/TAZO <=4 SENSITIVE Sensitive ug/mL    * 20,000 COLONIES/mL ESCHERICHIA COLI  MRSA Next  Gen by PCR, Nasal     Status: None   Collection Time: 06/13/24  7:54 PM   Specimen: Nasal Mucosa; Nasal Swab  Result Value Ref Range Status   MRSA by PCR Next Gen NOT DETECTED NOT DETECTED Final    Comment: (NOTE) The GeneXpert MRSA Assay (FDA approved for NASAL specimens only), is one component of a comprehensive MRSA colonization surveillance program. It is not intended to diagnose MRSA infection nor to guide or monitor treatment for MRSA infections. Test performance is not FDA approved in patients less than 38 years old. Performed at [facility], [address] [place], [place] [address]     Radiology Studies: No results found.  Scheduled Meds:  aspirin  EC  81 mg Oral Daily   busPIRone   30 mg Oral BID   enoxaparin  (LOVENOX ) injection  40 mg Subcutaneous Q24H   FLUoxetine   40 mg Oral Daily   insulin  aspart   0-15 Units Subcutaneous TID WC   insulin  aspart  0-5 Units Subcutaneous QHS   melatonin  3 mg Oral QHS   methocarbamol   500 mg Oral Once   pantoprazole   40 mg Oral Daily   polyethylene glycol  17 g Oral Daily   rosuvastatin   20 mg Oral Daily   senna-docusate  2 tablet Oral QHS   sucralfate   1 g Oral BID  Continuous Infusions:  cefTRIAXone  (ROCEPHIN )  IV Stopped (06/15/24 1856)    LOS: 3 days   Eric Nunnery M.D on 06/16/2024 at 4:25 PM  Go to [email or web address] - for contact info  [facility] - Office  [phone]  If 7PM-7AM, please contact night-coverage [email or web address] 06/16/2024, 4:25 PM

## 2024-06-16 NOTE — TOC Transition Note (Signed)
 Transition of Care [facility]) - Discharge Note   Patient Details  Name: Sophia [name] MRN: [id number] Date of Birth: Jun 30, 1951  [facility]) CM/SW Contact:  Lucie Lunger, LCSWA Phone Number: 06/16/2024, 10:47 AM  Clinical Narrative:    CSW updated that PT is recommending HH PT for pt at D/C. CSW spoke with pt to review, she is agreeable and does not have an agency preference. CSW spoke to [place] with [facility] who states they are able to accept [facility] PT referral. MD placed [facility] PT orders. HH agency info added to AVS for pt to review. TOC signing off.   Final next level of care: Home w Home Health Services Barriers to Discharge: Barriers Resolved   Patient Goals and CMS Choice Patient states their goals for this hospitalization and ongoing recovery are:: return home   Choice offered to / list presented to : Patient [place] ownership interest in [facility].provided to:: Patient    Discharge Placement                       Discharge Plan and Services Additional resources added to the After Visit Summary for       Post Acute Care Choice: Durable Medical Equipment                    HH Arranged: PT [facility] Agency: [facility] Date [facility] Agency Contacted: 06/16/24   Representative spoke with at [facility] Agency: Darleene  Social Drivers of Health (SDOH) Interventions SDOH Screenings   Food Insecurity: No Food Insecurity (06/13/2024)  Housing: Low Risk  (06/13/2024)  Transportation Needs: No Transportation Needs (06/13/2024)  Utilities: Not At Risk (06/13/2024)  Alcohol Screen: Low Risk  (10/22/2023)  Depression (PHQ2-9): Medium Risk (06/04/2024)  Financial Resource Strain: Low Risk  (10/22/2023)  Physical Activity: Insufficiently Active (10/22/2023)  Social Connections: Moderately Isolated (06/13/2024)  Stress: No Stress Concern Present (10/22/2023)  Tobacco Use: Medium Risk (06/13/2024)  Health Literacy: Adequate Health Literacy (10/22/2023)     Readmission Risk  Interventions    06/14/2024   10:20 AM  Readmission Risk Prevention Plan  Transportation Screening Complete  HRI or Home Care Consult Complete  Social Work Consult for Recovery Care Planning/Counseling Complete  Palliative Care Screening Not Applicable  Medication Review [employer]) Complete

## 2024-06-16 NOTE — Plan of Care (Signed)

## 2024-06-17 DIAGNOSIS — R6521 Severe sepsis with septic shock: Secondary | ICD-10-CM

## 2024-06-17 DIAGNOSIS — N390 Urinary tract infection, site not specified: Secondary | ICD-10-CM

## 2024-06-17 DIAGNOSIS — A419 Sepsis, unspecified organism: Secondary | ICD-10-CM | POA: Diagnosis not present

## 2024-06-17 DIAGNOSIS — A4151 Sepsis due to Escherichia coli [E. coli]: Secondary | ICD-10-CM

## 2024-06-17 DIAGNOSIS — R319 Hematuria, unspecified: Secondary | ICD-10-CM

## 2024-06-17 DIAGNOSIS — I5032 Chronic diastolic (congestive) heart failure: Secondary | ICD-10-CM

## 2024-06-17 DIAGNOSIS — N1831 Chronic kidney disease, stage 3a: Secondary | ICD-10-CM | POA: Diagnosis not present

## 2024-06-17 DIAGNOSIS — I10 Essential (primary) hypertension: Secondary | ICD-10-CM | POA: Diagnosis not present

## 2024-06-17 DIAGNOSIS — K219 Gastro-esophageal reflux disease without esophagitis: Secondary | ICD-10-CM

## 2024-06-17 DIAGNOSIS — E1169 Type 2 diabetes mellitus with other specified complication: Secondary | ICD-10-CM | POA: Diagnosis not present

## 2024-06-17 DIAGNOSIS — N179 Acute kidney failure, unspecified: Secondary | ICD-10-CM

## 2024-06-17 LAB — GLUCOSE, CAPILLARY
Glucose-Capillary: 87 mg/dL (ref 70–99)
Glucose-Capillary: 98 mg/dL (ref 70–99)

## 2024-06-17 MED ORDER — HEMORRHOIDAL COOLING 0.25-50 % EX GEL: CUTANEOUS | 0 refills | Status: DC

## 2024-06-17 MED ORDER — AMOXICILLIN-POT CLAVULANATE 875-125 MG PO TABS: 1.0000 | ORAL_TABLET | Freq: Three times a day (TID) | ORAL | 0 refills | Status: AC

## 2024-06-17 MED ORDER — SODIUM CHLORIDE 0.9 % IV SOLN
2.0000 g | INTRAVENOUS | Status: DC
  Administered 2024-06-17: 2 g via INTRAVENOUS
  Filled 2024-06-17: qty 20

## 2024-06-17 NOTE — Care Management Important Message (Signed)
 Important Message  Patient Details  Name: Sophia [name] MRN: [id number] Date of Birth: 23-Nov-1950   Important Message Given:  Yes - Medicare IM     [name] 06/17/2024, 2:18 PM

## 2024-06-17 NOTE — Plan of Care (Signed)

## 2024-06-17 NOTE — Discharge Summary (Signed)
 Physician Discharge Summary   Patient: Sophia [name] MRN: [id number] DOB: October 08, 1951  Admit date:     06/13/2024  Discharge date: 06/17/24  Discharge Physician: Eric Nunnery   PCP: Antonetta Rollene BRAVO, MD   Recommendations at discharge:  Reassess blood pressure and adjust antihypertensive regimen as needed Repeat basic metabolic panel to follow electrolytes and renal function Repeat CBC to follow WBC's/hemoglobin trend and stability  Discharge Diagnoses: Principal Problem:   Septic shock [facility]) Active Problems:   Essential hypertension   Gastroesophageal reflux disease   Type 2 diabetes mellitus with other specified complication (HCC)   Chronic diastolic heart failure (HCC)   Non-small cell carcinoma of lung, right (HCC)   CKD stage 3a, GFR 45-59 ml/min (HCC)   Sepsis secondary to UTI (HCC)   Urinary tract infection with hematuria   AKI (acute kidney injury) (HCC)   Sepsis due to Escherichia coli with acute renal failure and septic shock [facility])  Brief Hospital admission narrative: As per H&P written by Dr. Barbra on 06/13/24 Sophia [name] is a 73 y.o. female with medical history significant of diabetes hypertension, GERD, sleep apnea, stage III chronic kidney disease.  Patient brought to the hospital due to a fall that occurred last night.  She states that her legs felt weak before she fell.  She did hit her head but had no loss of consciousness.  She had some nausea and vomiting and frequency.  She was recently started on Farxiga  about 6 days ago.  Here, she was noted to have a low blood pressure.  She was started on broad-spectrum antibiotics and code sepsis was called.  She was bolused with IV fluids which helped with her blood pressure.  Her white count was noted to be 22.5 and was noted to have a UTI.  Denies flank pain, nausea, vomiting, shortness of breath.  After the antibiotics, she was noted to have a fever and shaking chills.    Assessment and Plan: 1)severe sepsis with  septic shock secondary to E. coli bacteremia UTI -Blood cultures from 06/13/2024 with E. Coli---appears not to be ESBL--sensitivities pending 06/15/24 -- Hemodynamics improved and patient hemodynamically stable at discharge - No pressors needed - WBCs adequately trending down and normalizing - Patient safe to go home with antibiotic therapy - 7 more days of Augmentin  3 times a day following discussion with ID will be pursued -Patient advised to maintain adequate hydration.   2)AKI----acute kidney injury  ---   creatinine on admission= 3.12,  baseline creatinine = 0.9-1.1   ,  -creatinine 3.12 >>1.97>>1.52>>1.20 - Renal function essentially back to baseline - Advised to maintain adequate hydration at discharge - Patient instructed to minimize the use of NSAIDs -Complete treatment with antibiotics for UTI. - Safe to resume olmesartan -and HCTZ   3)HTN--- BP stabilized -Patient advised to maintain adequate hydration - Resume home olmesartan /HCTZ combo pill - Patient encouraged to follow heart healthy/low-sodium diet.   4)DM2 ----A1c 5.8 reflecting excellent diabetic control PTA -Sliding scale insulin  provided - Farxiga  will be resumed at discharge - Continue modified carbohydrate diet  5)HFpEF--- chronic diastolic dysfunction CHF - No acute exacerbation appreciated. - Resume home olmesartan  and HCTZ at discharge - Heart healthy/low-sodium diet discussed with patient - Continue to maintain adequate hydration and follow daily weights   6)GERD- -c/n Protonix  and Carafate  -Lifestyle changes discussed with patient.   7)Depression/Anxiety- -no suicidal ideation or hallucination -continue Prozac  and buspirone    8)HLD--continue Crestor    9)Generalized weakness/deconditioning- -appreciate assistance and recommendation by PT -  Patient discharged home with home health services.   10)Hypokalemia/Hyponatremia-. -resolved with hydration and electrolytes repletion - Repeat basic  metabolic panel at follow-up visit to follow trend/stability.  11) history of lung cancer - Appears to be stable and in remission - Continue outpatient follow-up with oncology service  Consultants: ID curbside. Procedures performed: See below for x-ray report Disposition: Home with home health services. Diet recommendation: Heart healthy/modified carbohydrate diet.  DISCHARGE MEDICATION: Allergies as of 06/17/2024   No Known Allergies      Medication List     STOP taking these medications    chlorthalidone  25 MG tablet Commonly known as: HYGROTON        TAKE these medications    acetaminophen  650 MG CR tablet Commonly known as: TYLENOL  Take 650 mg by mouth 2 (two) times daily.   albuterol  108 (90 Base) MCG/ACT inhaler Commonly known as: VENTOLIN  HFA Inhale 2 puffs into the lungs every 6 (six) hours as needed for wheezing or shortness of breath.   amoxicillin -clavulanate 875-125 MG tablet Commonly known as: AUGMENTIN  Take 1 tablet by mouth in the morning, at noon, and at bedtime for 7 days.   ASPIRIN  EC PO Take 325 mg by mouth daily. Swallow whole.   busPIRone  30 MG tablet Commonly known as: BUSPAR  Take one tablet by mouth two times daily   dapagliflozin  propanediol 10 MG Tabs tablet Commonly known as: FARXIGA  Take 1 tablet (10 mg total) by mouth daily.   ferrous gluconate  324 MG tablet Commonly known as: FERGON Take 324 mg by mouth daily with breakfast.   FLUoxetine  40 MG capsule Commonly known as: PROZAC  Take 1 capsule (40 mg total) by mouth daily.   gabapentin  300 MG capsule Commonly known as: Neurontin  Take 1 capsule (300 mg total) by mouth 2 (two) times daily.   Hemorrhoidal Cooling 0.25-50 % Gel Generic drug: Phenylephrine -Witch [place]el Applied twice a day as needed for hemorrhoids   Melatonin 1 MG Chew Chew 2 mg by mouth at bedtime.   multivitamin tablet Take 1 tablet by mouth daily.   olmesartan -hydrochlorothiazide  20-12.5 MG  tablet Commonly known as: BENICAR  HCT TAKE ONE TABLET BY MOUTH EVERY DAY   omeprazole  40 MG capsule Commonly known as: PRILOSEC TAKE 1 CAPSULE TWICE DAILY FOR 2 MONTH ONLY   polyethylene glycol 17 g packet Commonly known as: MIRALAX  / GLYCOLAX  Take 17 g by mouth daily as needed for moderate constipation.   rosuvastatin  20 MG tablet Commonly known as: CRESTOR  Take 1 tablet (20 mg total) by mouth daily.   sucralfate  1 g tablet Commonly known as: CARAFATE  Take 1 tablet (1 g total) by mouth 2 (two) times daily.   traZODone  50 MG tablet Commonly known as: DESYREL  Take 0.5-1 tablets (25-50 mg total) by mouth at bedtime as needed for sleep.   VITAMIN B-12 PO 1 tablet. Take 2 gummies daily.        Follow-up Information     [facility] Follow up.   Specialty: Home Health Services Why: Agency will call to set up first home visit. Contact information: [address] [place] [address] [phone]         Antonetta Rollene BRAVO, MD. Schedule an appointment as soon as possible for a visit in 10 day(s).   Specialty: Family Medicine Contact information: [address] [place] [address] [phone]                Discharge Exam: Fredricka Weights   06/13/24 1552  06/13/24 1946  Weight: 86 kg 90 kg   General exam: Alert, awake, oriented x 3. No fever, no chest pain, no shortness of breath.  Feeling ready to go home. Respiratory system: Clear to auscultation. Respiratory effort normal.  Good saturation on room air.  Not using accessory muscles. Cardiovascular system:RRR. No murmurs, rubs, gallops. Gastrointestinal system: Abdomen is nondistended, soft and nontender. No organomegaly or masses felt. Normal bowel sounds heard. Central nervous system: Alert and oriented. No focal neurological deficits. Extremities: No C/C/E, +pedal pulses Skin: No rashes, lesions or ulcers Psychiatry: Judgement and insight appear normal. Mood &  affect appropriate.   Condition at discharge: Stable and improved.   The results of significant diagnostics from this hospitalization (including imaging, microbiology, ancillary and laboratory) are listed below for reference.   Imaging Studies: CT CHEST ABDOMEN PELVIS WO CONTRAST Result Date: 06/13/2024 CLINICAL DATA:  Sepsis EXAM: CT CHEST, ABDOMEN AND PELVIS WITHOUT CONTRAST TECHNIQUE: Multidetector CT imaging of the chest, abdomen and pelvis was performed following the standard protocol without IV contrast. RADIATION DOSE REDUCTION: This exam was performed according to the departmental dose-optimization program which includes automated exposure control, adjustment of the mA and/or kV according to patient size and/or use of iterative reconstruction technique. COMPARISON:  PET CT 01/09/2024. FINDINGS: CT CHEST FINDINGS Cardiovascular: No significant vascular findings. Normal heart size. No pericardial effusion. There are atherosclerotic calcifications of the aorta. Mediastinum/Nodes: No enlarged mediastinal, hilar, or axillary lymph nodes. Thyroid  gland, trachea, and esophagus demonstrate no significant findings. Lungs/Pleura: Patient is status post resection of right lower lobe nodule. There is some atelectasis along the staple line with trace right pleural effusion. There is a 2 mm right lower lobe nodule image 4/73 which is not definitely seen on prior PET-CT, although this may be related to differences in technique. Mild emphysema again noted. Musculoskeletal: No chest wall mass or suspicious bone lesions identified. CT ABDOMEN PELVIS FINDINGS Hepatobiliary: No focal liver abnormality is seen. No gallstones, gallbladder wall thickening, or biliary dilatation. Pancreas: Unremarkable. No pancreatic ductal dilatation or surrounding inflammatory changes. Spleen: Normal in size without focal abnormality. Adrenals/Urinary Tract: Adrenal glands are unremarkable. Kidneys are normal, without renal calculi, focal  lesion, or hydronephrosis. Bladder is unremarkable. Stomach/Bowel: Stomach is within normal limits. Appendix is not seen. No evidence of bowel wall thickening, distention, or inflammatory changes. Vascular/Lymphatic: Aortic atherosclerosis. No enlarged abdominal or pelvic lymph nodes. Reproductive: Status post hysterectomy. No adnexal masses. Other: No abdominal wall hernia or abnormality. No abdominopelvic ascites. Musculoskeletal: No acute or significant osseous findings. IMPRESSION: 1. Status post resection of right lower lobe nodule. There is some atelectasis along the staple line with trace right pleural effusion. 2. 2 mm right lower lobe nodule is not definitely seen on prior PET-CT, although this may be related to differences in technique. 3. No acute localizing process in the abdomen or pelvis. Aortic Atherosclerosis (ICD10-I70.0) and Emphysema (ICD10-J43.9). Electronically Signed   By: Greig Pique M.D.   On: 06/13/2024 18:12   CT Cervical Spine Wo Contrast Result Date: 06/13/2024 CLINICAL DATA:  Fall.  Blunt head and neck trauma. EXAM: CT CERVICAL SPINE WITHOUT CONTRAST TECHNIQUE: Multidetector CT imaging of the cervical spine was performed without intravenous contrast. Multiplanar CT image reconstructions were also generated. RADIATION DOSE REDUCTION: This exam was performed according to the departmental dose-optimization program which includes automated exposure control, adjustment of the mA and/or kV according to patient size and/or use of iterative reconstruction technique. COMPARISON:  None Available. FINDINGS: Alignment: Normal. Skull base and  vertebrae: No acute fracture. No primary bone lesion or focal pathologic process. Soft tissues and spinal canal: No prevertebral fluid or swelling. No visible canal hematoma. Disc levels: No disc space narrowing. Moderate bilateral facet DJD is seen at C3-4 and C4-5. Atlantoaxial degenerative changes also noted. Upper chest: No acute findings. Other: None.  IMPRESSION: No evidence of cervical spine fracture or subluxation. Degenerative cervical spondylosis, as described above. Electronically Signed   By: Norleen DELENA Kil M.D.   On: 06/13/2024 16:53   DG Chest Portable 1 View Result Date: 06/13/2024 CLINICAL DATA:  Recent fall.  Hypotension. EXAM: PORTABLE CHEST 1 VIEW COMPARISON:  05/11/2024 FINDINGS: The heart size and mediastinal contours are within normal limits. Both lungs are clear. The visualized skeletal structures are unremarkable. IMPRESSION: No active disease. Electronically Signed   By: Norleen DELENA Kil M.D.   On: 06/13/2024 16:50   CT Head Wo Contrast Result Date: 06/13/2024 CLINICAL DATA:  Fall last night with blunt head trauma. Headache and weakness. EXAM: CT HEAD WITHOUT CONTRAST TECHNIQUE: Contiguous axial images were obtained from the base of the skull through the vertex without intravenous contrast. RADIATION DOSE REDUCTION: This exam was performed according to the departmental dose-optimization program which includes automated exposure control, adjustment of the mA and/or kV according to patient size and/or use of iterative reconstruction technique. COMPARISON:  None Available. FINDINGS: Brain: No evidence of intracranial hemorrhage, acute infarction, hydrocephalus, extra-axial collection, or mass lesion/mass effect. Vascular:  No hyperdense vessel or other acute findings. Skull: No evidence of fracture or other significant bone abnormality. Sinuses/Orbits:  No acute findings. Other: None. IMPRESSION: Negative noncontrast head CT. Electronically Signed   By: Norleen DELENA Kil M.D.   On: 06/13/2024 16:49    Microbiology: Results for orders placed or performed during the hospital encounter of 06/13/24  Resp panel by RT-PCR (RSV, Flu A&B, Covid) Anterior Nasal Swab     Status: None   Collection Time: 06/13/24  4:31 PM   Specimen: Anterior Nasal Swab  Result Value Ref Range Status   SARS Coronavirus 2 by RT PCR NEGATIVE NEGATIVE Final    Comment:  (NOTE) SARS-CoV-2 target nucleic acids are NOT DETECTED.  The SARS-CoV-2 RNA is generally detectable in upper respiratory specimens during the acute phase of infection. The lowest concentration of SARS-CoV-2 viral copies this assay can detect is 138 copies/mL. A negative result does not preclude SARS-Cov-2 infection and should not be used as the sole basis for treatment or other patient management decisions. A negative result may occur with  improper specimen collection/handling, submission of specimen other than nasopharyngeal swab, presence of viral mutation(s) within the areas targeted by this assay, and inadequate number of viral copies(<138 copies/mL). A negative result must be combined with clinical observations, patient history, and epidemiological information. The expected result is Negative.  Fact Sheet for Patients:  BloggerCourse.com  Fact Sheet for Healthcare Providers:  SeriousBroker.it  This test is no t yet approved or cleared by the [place]  FDA and  has been authorized for detection and/or diagnosis of SARS-CoV-2 by FDA under an Emergency Use Authorization (EUA). This EUA will remain  in effect (meaning this test can be used) for the duration of the COVID-19 declaration under Section 564(b)(1) of the Act, 21 U.S.C.section 360bbb-3(b)(1), unless the authorization is terminated  or revoked sooner.       Influenza A by PCR NEGATIVE NEGATIVE Final   Influenza B by PCR NEGATIVE NEGATIVE Final    Comment: (NOTE) The Xpert Xpress SARS-CoV-2/FLU/RSV plus assay is  intended as an aid in the diagnosis of influenza from Nasopharyngeal swab specimens and should not be used as a sole basis for treatment. Nasal washings and aspirates are unacceptable for Xpert Xpress SARS-CoV-2/FLU/RSV testing.  Fact Sheet for Patients: BloggerCourse.com  Fact Sheet for Healthcare  Providers: SeriousBroker.it  This test is not yet approved or cleared by the [place]  FDA and has been authorized for detection and/or diagnosis of SARS-CoV-2 by FDA under an Emergency Use Authorization (EUA). This EUA will remain in effect (meaning this test can be used) for the duration of the COVID-19 declaration under Section 564(b)(1) of the Act, 21 U.S.C. section 360bbb-3(b)(1), unless the authorization is terminated or revoked.     Resp Syncytial Virus by PCR NEGATIVE NEGATIVE Final    Comment: (NOTE) Fact Sheet for Patients: BloggerCourse.com  Fact Sheet for Healthcare Providers: SeriousBroker.it  This test is not yet approved or cleared by the [place]  FDA and has been authorized for detection and/or diagnosis of SARS-CoV-2 by FDA under an Emergency Use Authorization (EUA). This EUA will remain in effect (meaning this test can be used) for the duration of the COVID-19 declaration under Section 564(b)(1) of the Act, 21 U.S.C. section 360bbb-3(b)(1), unless the authorization is terminated or revoked.  Performed at [facility], [address], [place], [place] [address]   Blood Culture (routine x 2)     Status: Abnormal   Collection Time: 06/13/24  5:01 PM   Specimen: Left Antecubital; Blood  Result Value Ref Range Status   Specimen Description   Final    LEFT ANTECUBITAL BOTTLES DRAWN AEROBIC AND ANAEROBIC Performed at [facility], [address], [place], [place] [address]    Special Requests   Final    Blood Culture adequate volume Performed at [facility], [address] [place], [place] [address]    Culture  Setup Time   Final    GRAM NEGATIVE RODS IN BOTH AEROBIC AND ANAEROBIC BOTTLES Gram Stain Report Called to,Read Back By and Verified With: G ROOS AT 0844 ON [id number] BY  S DALTON GRAM STAIN REVIEWED-AGREE WITH RESULT DRT CRITICAL VALUE NOTED.  VALUE IS CONSISTENT WITH  PREVIOUSLY REPORTED AND CALLED VALUE.    Culture (A)  Final    ESCHERICHIA COLI SUSCEPTIBILITIES PERFORMED ON PREVIOUS CULTURE WITHIN THE LAST 5 DAYS. Performed at [facility], [address] [place], [place] [address]    Report Status 06/16/2024 FINAL  Final  Blood Culture (routine x 2)     Status: Abnormal   Collection Time: 06/13/24  5:01 PM   Specimen: BLOOD LEFT HAND  Result Value Ref Range Status   Specimen Description   Final    BLOOD LEFT HAND AEROBIC BOTTLE ONLY Performed at [facility], [address], [place], [place] [address]    Special Requests   Final    Blood Culture results may not be optimal due to an inadequate volume of blood received in culture bottles Performed at [facility], [address] [place], [place] [address]    Culture  Setup Time   Final    GRAM NEGATIVE RODS AEROBIC BOTTLE ONLY Gram Stain Report Called to,Read Back By and Verified With: A SHELTON AT 0715 ON [id number] BY S DALTON CRITICAL RESULT CALLED TO, READ BACK BY AND VERIFIED WITH: PHARMD STEVEN HURTH ON 06/14/24 @ 1250 BY DRT Performed at [facility], [address] [place], [place] [address]    Culture ESCHERICHIA COLI (A)  Final   Report  Status 06/16/2024 FINAL  Final   Organism ID, Bacteria ESCHERICHIA COLI  Final   Organism ID, Bacteria ESCHERICHIA COLI  Final      Susceptibility   Escherichia coli - KIRBY BAUER*    CEFAZOLIN  INTERMEDIATE Intermediate    Escherichia coli - MIC*    AMPICILLIN >=32 RESISTANT Resistant     CEFEPIME  <=0.12 SENSITIVE Sensitive     CEFTAZIDIME <=1 SENSITIVE Sensitive     CEFTRIAXONE  <=0.25 SENSITIVE Sensitive     CIPROFLOXACIN  1 RESISTANT Resistant     GENTAMICIN <=1 SENSITIVE Sensitive     IMIPENEM <=0.25 SENSITIVE Sensitive     TRIMETH /SULFA  >=320 RESISTANT Resistant     AMPICILLIN/SULBACTAM 8 SENSITIVE Sensitive     PIP/TAZO <=4 SENSITIVE Sensitive ug/mL    * ESCHERICHIA COLI    ESCHERICHIA COLI  Blood Culture ID Panel (Reflexed)      Status: Abnormal   Collection Time: 06/13/24  5:01 PM  Result Value Ref Range Status   Enterococcus faecalis NOT DETECTED NOT DETECTED Final   Enterococcus Faecium NOT DETECTED NOT DETECTED Final   Listeria monocytogenes NOT DETECTED NOT DETECTED Final   Staphylococcus species NOT DETECTED NOT DETECTED Final   Staphylococcus aureus (BCID) NOT DETECTED NOT DETECTED Final   Staphylococcus epidermidis NOT DETECTED NOT DETECTED Final   Staphylococcus lugdunensis NOT DETECTED NOT DETECTED Final   Streptococcus species NOT DETECTED NOT DETECTED Final   Streptococcus agalactiae NOT DETECTED NOT DETECTED Final   Streptococcus pneumoniae NOT DETECTED NOT DETECTED Final   Streptococcus pyogenes NOT DETECTED NOT DETECTED Final   A.calcoaceticus-baumannii NOT DETECTED NOT DETECTED Final   Bacteroides fragilis NOT DETECTED NOT DETECTED Final   Enterobacterales DETECTED (A) NOT DETECTED Final    Comment: Enterobacterales represent a large order of gram negative bacteria, not a single organism. CRITICAL RESULT CALLED TO, READ BACK BY AND VERIFIED WITH: PHARMD STEVEN HURTH ON 06/14/24 @ 1250 BY DRT    Enterobacter cloacae complex NOT DETECTED NOT DETECTED Final   Escherichia coli DETECTED (A) NOT DETECTED Final    Comment: CRITICAL RESULT CALLED TO, READ BACK BY AND VERIFIED WITH: PHARMD STEVEN HURTH ON 06/14/24 @ 1250 BY DRT    Klebsiella aerogenes NOT DETECTED NOT DETECTED Final   Klebsiella oxytoca NOT DETECTED NOT DETECTED Final   Klebsiella pneumoniae NOT DETECTED NOT DETECTED Final   Proteus species NOT DETECTED NOT DETECTED Final   Salmonella species NOT DETECTED NOT DETECTED Final   Serratia marcescens NOT DETECTED NOT DETECTED Final   Haemophilus influenzae NOT DETECTED NOT DETECTED Final   Neisseria meningitidis NOT DETECTED NOT DETECTED Final   Pseudomonas aeruginosa NOT DETECTED NOT DETECTED Final   Stenotrophomonas maltophilia NOT DETECTED NOT DETECTED Final   Candida albicans NOT  DETECTED NOT DETECTED Final   Candida auris NOT DETECTED NOT DETECTED Final   Candida glabrata NOT DETECTED NOT DETECTED Final   Candida krusei NOT DETECTED NOT DETECTED Final   Candida parapsilosis NOT DETECTED NOT DETECTED Final   Candida tropicalis NOT DETECTED NOT DETECTED Final   Cryptococcus neoformans/gattii NOT DETECTED NOT DETECTED Final   CTX-M ESBL NOT DETECTED NOT DETECTED Final   Carbapenem resistance IMP NOT DETECTED NOT DETECTED Final   Carbapenem resistance KPC NOT DETECTED NOT DETECTED Final   Carbapenem resistance NDM NOT DETECTED NOT DETECTED Final   Carbapenem resist OXA 48 LIKE NOT DETECTED NOT DETECTED Final   Carbapenem resistance VIM NOT DETECTED NOT DETECTED Final    Comment: Performed at [facility], [address], [place], [place] [address]  Urine Culture     Status: Abnormal   Collection Time: 06/13/24  5:27 PM   Specimen: Urine, Clean Catch  Result Value Ref Range Status   Specimen Description   Final    URINE, CLEAN CATCH Performed at [facility], [address], [place], [place] [address]    Special Requests   Final    NONE Performed at [facility], [address] [place], [place] [address]    Culture 20,000 COLONIES/mL ESCHERICHIA COLI (A)  Final   Report Status 06/16/2024 FINAL  Final   Organism ID, Bacteria ESCHERICHIA COLI (A)  Final      Susceptibility   Escherichia coli - MIC*    AMPICILLIN >=32 RESISTANT Resistant     CEFAZOLIN  <=4 SENSITIVE Sensitive     CEFEPIME  <=0.12 SENSITIVE Sensitive     CEFTRIAXONE  <=0.25 SENSITIVE Sensitive     CIPROFLOXACIN  1 RESISTANT Resistant     GENTAMICIN <=1 SENSITIVE Sensitive     IMIPENEM <=0.25 SENSITIVE Sensitive     NITROFURANTOIN <=16 SENSITIVE Sensitive     TRIMETH /SULFA  >=320 RESISTANT Resistant     AMPICILLIN/SULBACTAM 4 SENSITIVE Sensitive     PIP/TAZO <=4 SENSITIVE Sensitive ug/mL    * 20,000 COLONIES/mL ESCHERICHIA COLI  MRSA Next Gen by PCR, Nasal     Status: None   Collection  Time: 06/13/24  7:54 PM   Specimen: Nasal Mucosa; Nasal Swab  Result Value Ref Range Status   MRSA by PCR Next Gen NOT DETECTED NOT DETECTED Final    Comment: (NOTE) The GeneXpert MRSA Assay (FDA approved for NASAL specimens only), is one component of a comprehensive MRSA colonization surveillance program. It is not intended to diagnose MRSA infection nor to guide or monitor treatment for MRSA infections. Test performance is not FDA approved in patients less than 63 years old. Performed at [facility], [address], [place], [place] [address]     Labs: CBC: Recent Labs  Lab 06/13/24 1620 06/14/24 0425 06/15/24 0308 06/16/24 0422  WBC 22.5* 19.5* 26.5* 17.6*  NEUTROABS 19.6*  --   --   --   HGB 10.4* 9.7* 9.3* 9.7*  HCT 33.0* 31.4* 29.2* 30.3*  MCV 83.1 84.6 82.0 81.0  PLT 186 121* 123* 137*   Basic Metabolic Panel: Recent Labs  Lab 06/13/24 1620 06/14/24 0425 06/15/24 0308 06/16/24 0422  NA 133* 132* 141 137  K 3.3* 2.7* 3.8 3.3*  CL 93* 97* 106 100  CO2 24 22 25 26   GLUCOSE 98 109* 105* 94  BUN 90* 68* 57* 38*  CREATININE 3.12* 1.97* 1.52* 1.20*  CALCIUM  9.1 8.0* 8.1* 8.4*  MG 1.8  --   --  2.0  PHOS  --   --  2.0* 2.1*   Liver Function Tests: Recent Labs  Lab 06/15/24 0308 06/16/24 0422  ALBUMIN 2.6* 2.7*   CBG: Recent Labs  Lab 06/16/24 1100 06/16/24 1623 06/16/24 2005 06/17/24 0720 06/17/24 1207  GLUCAP 101* 122* 88 87 98    Discharge time spent:  35 minutes.  Signed: Eric Nunnery, MD [facility] 06/17/2024

## 2024-06-18 ENCOUNTER — Telehealth: Payer: Self-pay

## 2024-06-18 DIAGNOSIS — N1831 Chronic kidney disease, stage 3a: Secondary | ICD-10-CM | POA: Diagnosis not present

## 2024-06-18 DIAGNOSIS — N390 Urinary tract infection, site not specified: Secondary | ICD-10-CM | POA: Diagnosis not present

## 2024-06-18 DIAGNOSIS — I130 Hypertensive heart and chronic kidney disease with heart failure and stage 1 through stage 4 chronic kidney disease, or unspecified chronic kidney disease: Secondary | ICD-10-CM | POA: Diagnosis not present

## 2024-06-18 DIAGNOSIS — N179 Acute kidney failure, unspecified: Secondary | ICD-10-CM | POA: Diagnosis not present

## 2024-06-18 DIAGNOSIS — C3491 Malignant neoplasm of unspecified part of right bronchus or lung: Secondary | ICD-10-CM | POA: Diagnosis not present

## 2024-06-18 DIAGNOSIS — A4151 Sepsis due to Escherichia coli [E. coli]: Secondary | ICD-10-CM | POA: Diagnosis not present

## 2024-06-18 DIAGNOSIS — I5032 Chronic diastolic (congestive) heart failure: Secondary | ICD-10-CM | POA: Diagnosis not present

## 2024-06-18 DIAGNOSIS — E1122 Type 2 diabetes mellitus with diabetic chronic kidney disease: Secondary | ICD-10-CM | POA: Diagnosis not present

## 2024-06-18 DIAGNOSIS — R6521 Severe sepsis with septic shock: Secondary | ICD-10-CM | POA: Diagnosis not present

## 2024-06-18 NOTE — Transitions of Care (Post Inpatient/ED Visit) (Signed)
 06/18/2024  Name: Sophia [name] MRN: [id number] DOB: 03-10-1951  Today's TOC FU Call Status: Today's TOC FU Call Status:: Successful TOC FU Call Completed TOC FU Call Complete Date: 06/18/24 Patient's Name and Date of Birth confirmed.  Transition Care Management Follow-up Telephone Call Date of Discharge: 06/17/24 [facility] Type of Discharge: Inpatient Admission Primary Inpatient Discharge Diagnosis:: Septic shock How have you been since you were released from the hospital?: Better (feeling weak) Any questions or concerns?: No  Items Reviewed: Did you receive and understand the discharge instructions provided?: Yes Medications obtained,verified, and reconciled?: Yes (Medications Reviewed) Any new allergies since your discharge?: No Dietary orders reviewed?: Yes Type of Diet Ordered:: low salt Do you have support at home?: Yes People in Home [RPT]: child(ren), adult Name of Support/Comfort Primary Source: son lives with her- recent loss of husband. niece manages medications  Medications Reviewed Today: Medications Reviewed Today     Reviewed by Rumalda Alan PENNER, RN (Registered Nurse) on 06/18/24 at 1251  Med List Status: <None>   Medication Order Taking? Sig Documenting Provider Last Dose Status Informant  acetaminophen  (TYLENOL ) 650 MG CR tablet [id number] Yes Take 650 mg by mouth 2 (two) times daily. [provider]  Active Pharmacy Records, Family Member  albuterol  (VENTOLIN  HFA) 108 [phone] Base) MCG/ACT inhaler [id number]  Inhale 2 puffs into the lungs every 6 (six) hours as needed for wheezing or shortness of breath.  Patient not taking: Reported on 06/18/2024   Antonetta Rollene BRAVO, MD  Active Pharmacy Records, Family Member  amoxicillin -clavulanate (AUGMENTIN ) 875-125 MG tablet [id number] Yes Take 1 tablet by mouth in the morning, at noon, and at bedtime for 7 days. Ricky Fines, MD  Active   ASPIRIN  EC PO [id number] Yes Take 325 mg by mouth daily.  Swallow whole. [provider]  Active Pharmacy Records, Family Member  busPIRone  (BUSPAR ) 30 MG tablet [id number] Yes Take one tablet by mouth two times daily Antonetta Rollene BRAVO, MD  Active Pharmacy Records, Family Member  Cyanocobalamin  (VITAMIN B-12 PO) [id number] Yes 1 tablet. Take 2 gummies daily. [provider]  Active Pharmacy Records, Family Member  dapagliflozin  propanediol (FARXIGA ) 10 MG TABS tablet [id number] Yes Take 1 tablet (10 mg total) by mouth daily. Mallipeddi, Diannah SQUIBB, MD  Active Pharmacy Records, Family Member           Med Note (WARD, CHUCK KANDICE Repress Jun 13, 2024  7:24 PM) Pt started taking this 6 days ago and the pt's family are worried it is causing an adverse affect  ferrous gluconate  (FERGON) 324 MG tablet [id number] Yes Take 324 mg by mouth daily with breakfast. [provider]  Active Pharmacy Records, Family Member  FLUoxetine  (PROZAC ) 40 MG capsule [id number] Yes Take 1 capsule (40 mg total) by mouth daily. Antonetta Rollene BRAVO, MD  Active Pharmacy Records, Family Member  gabapentin  (NEURONTIN ) 300 MG capsule [id number] Yes Take 1 capsule (300 mg total) by mouth 2 (two) times daily. Kerrin Elspeth BROCKS, MD  Active Pharmacy Records, Family Member           Med Note [phone], CHUCK KANDICE Repress Jun 13, 2024  7:21 PM) Caused excessive drowsiness   Melatonin 1 MG CHEW [id number] Yes Chew 2 mg by mouth at bedtime. Antonetta Rollene BRAVO, MD  Active Pharmacy Records, Family Member  Multiple Vitamin (MULTIVITAMIN) tablet [id number] Yes Take 1 tablet by mouth daily. [provider]  Active Pharmacy Records, Family Member  olmesartan -hydrochlorothiazide  (BENICAR   HCT) 20-12.5 MG tablet [id number] Yes TAKE ONE TABLET BY MOUTH EVERY DAY Wert, Michael B, MD  Active Pharmacy Records, Family Member  omeprazole  [facility]) 40 MG capsule [id number]  TAKE 1 CAPSULE TWICE DAILY FOR 2 MONTH ONLY  Patient not taking: Reported on 06/18/2024   Cindie Carlin POUR, DO  Active  Pharmacy Records, Family Member           Med Note [phone], CHUCK KANDICE Repress Jun 13, 2024  4:56 PM)    Phenylephrine -Witch Hazel (HEMORRHOIDAL COOLING) 0.25-50 % GEL [id number] Yes Applied twice a day as needed for hemorrhoids Ricky Fines, MD  Active   polyethylene glycol (MIRALAX  / GLYCOLAX ) 17 g packet [id number] Yes Take 17 g by mouth daily as needed for moderate constipation. [provider]  Active Pharmacy Records, Family Member  rosuvastatin  (CRESTOR ) 20 MG tablet [id number] Yes Take 1 tablet (20 mg total) by mouth daily. Antonetta Rollene BRAVO, MD  Active Pharmacy Records, Family Member  sucralfate  (CARAFATE ) 1 g tablet [id number]  Take 1 tablet (1 g total) by mouth 2 (two) times daily.  Patient not taking: Reported on 06/18/2024   Antonetta Rollene BRAVO, MD  Active Pharmacy Records, Family Member  traZODone  (DESYREL ) 50 MG tablet [id number] Yes Take 0.5-1 tablets (25-50 mg total) by mouth at bedtime as needed for sleep. Antonetta Rollene BRAVO, MD  Active Pharmacy Records, Family Member  Med List Note (Ward, Angelica, CPhT 06/13/24 1927): Pt's niece helped with med rec            Home Care and Equipment/Supplies: Were Home Health Services Ordered?: Yes Name of Home Health Agency[facility] Has Agency set up a time to come to your home?: Yes First Home Health Visit Date: 06/18/24 Any new equipment or medical supplies ordered?: No  Functional Questionnaire: Do you need assistance with bathing/showering or dressing?: No Do you need assistance with meal preparation?: Yes (family) Do you need assistance with eating?: No Do you have difficulty maintaining continence: No Do you need assistance with getting out of bed/getting out of a chair/moving?: No Do you have difficulty managing or taking your medications?: Yes (family manages medications)  Follow up appointments reviewed: PCP Follow-up appointment confirmed?: No (has a pending appointment but not in 1 week. Family to call and schedule  based on transportation) MD Provider Line Number:[phone] Given: No Specialist Hospital Follow-up appointment confirmed?: No Reason Specialist Follow-Up Not Confirmed: Patient has Specialist Provider Number and will Call for Appointment Do you need transportation to your follow-up appointment?: No Do you understand care options if your condition(s) worsen?: Yes-patient verbalized understanding  SDOH Interventions Today    Flowsheet Row Most Recent Value  SDOH Interventions   Food Insecurity Interventions Intervention Not Indicated  Housing Interventions Intervention Not Indicated  Transportation Interventions Intervention Not Indicated    Goals Addressed             This Visit's Progress    [facility] Transitions of Care (TOC) Care Plan       Problems:  Recent Hospitalization for treatment of recent admission for sepsis, syncopy Medication management barrier patient does not know all her medications, Family manages. and No Hospital Follow Up Provider appointment NO PCP hospital follow up yet scheduled.   Goal:  Over the next 30 days, the patient will not experience hospital readmission  Interventions:  Transitions of Care: Doctor Visits  - discussed the importance of doctor visits Post discharge activity limitations prescribed by provider reviewed Reviewed Signs and symptoms of infection  Reviewed importance of hydration Reviewed low salt diet Reviewed need for daily weights Reviewed medications to the best of patients ability. Spoke with son who is living with patient and he reports patient has all medications but not the bottles. Reviewed importance of hospital follow up with PCP in 1 week vs previous scheduled follow up.  Reviewed DM under control with diet. No meds.  Reviewed recent loss of husband and son living with patient at this time.  Reviewed importance of taking antibiotics with food due to potential GI upset.  Reviewed and offered 30 day TOC program and patient  has accepted. Appointment scheduled for next week.  This note routed to MD.   Patient Self Care Activities:  Attend all scheduled provider appointments Call pharmacy for medication refills 3-7 days in advance of running out of medications Call provider office for new concerns or questions  Notify RN Care Manager of TOC call rescheduling needs Participate in Transition of Care Program/Attend TOC scheduled calls Take medications as prescribed   Weigh daily and record weights Follow low salt and DM diet Keep home health appointments.    Plan:  Telephone follow up appointment with care management team member scheduled for:  06/25/2024 The patient has been provided with contact information for the care management team and has been advised to call with any health related questions or concerns.        Alan Ee, RN, BSN, CEN [employer]- Transition of Care Team.  [facility] [phone]

## 2024-06-21 ENCOUNTER — Encounter: Payer: Self-pay | Admitting: *Deleted

## 2024-06-21 NOTE — Telephone Encounter (Signed)
 Noted

## 2024-06-22 ENCOUNTER — Telehealth: Payer: Self-pay | Admitting: Family Medicine

## 2024-06-22 DIAGNOSIS — I5032 Chronic diastolic (congestive) heart failure: Secondary | ICD-10-CM | POA: Diagnosis not present

## 2024-06-22 DIAGNOSIS — N1831 Chronic kidney disease, stage 3a: Secondary | ICD-10-CM | POA: Diagnosis not present

## 2024-06-22 DIAGNOSIS — A4151 Sepsis due to Escherichia coli [E. coli]: Secondary | ICD-10-CM | POA: Diagnosis not present

## 2024-06-22 DIAGNOSIS — N390 Urinary tract infection, site not specified: Secondary | ICD-10-CM | POA: Diagnosis not present

## 2024-06-22 DIAGNOSIS — C3491 Malignant neoplasm of unspecified part of right bronchus or lung: Secondary | ICD-10-CM | POA: Diagnosis not present

## 2024-06-22 DIAGNOSIS — I130 Hypertensive heart and chronic kidney disease with heart failure and stage 1 through stage 4 chronic kidney disease, or unspecified chronic kidney disease: Secondary | ICD-10-CM | POA: Diagnosis not present

## 2024-06-22 DIAGNOSIS — R6521 Severe sepsis with septic shock: Secondary | ICD-10-CM | POA: Diagnosis not present

## 2024-06-22 DIAGNOSIS — E1122 Type 2 diabetes mellitus with diabetic chronic kidney disease: Secondary | ICD-10-CM | POA: Diagnosis not present

## 2024-06-22 DIAGNOSIS — N179 Acute kidney failure, unspecified: Secondary | ICD-10-CM | POA: Diagnosis not present

## 2024-06-22 NOTE — Telephone Encounter (Signed)
 Ben given verbal orders

## 2024-06-22 NOTE — Telephone Encounter (Unsigned)
 Copied from CRM #[id number]. Topic: Clinical - Home Health Verbal Orders >> Jun 22, 2024  2:06 PM Donee H wrote: Caller/Agency: Ben with PT from [facility]   Callback Number: [phone]  Service Requested: Physical Therapy Frequency: 2 times a week for 3 weeks and 1 time a week for 3 weeks  Any new concerns about the patient? No but patient is declining the need for an home health aid.  Would just like physical therapy

## 2024-06-23 ENCOUNTER — Other Ambulatory Visit: Payer: Self-pay | Admitting: Family Medicine

## 2024-06-24 ENCOUNTER — Telehealth: Payer: Self-pay

## 2024-06-24 DIAGNOSIS — C3491 Malignant neoplasm of unspecified part of right bronchus or lung: Secondary | ICD-10-CM | POA: Diagnosis not present

## 2024-06-24 DIAGNOSIS — E1122 Type 2 diabetes mellitus with diabetic chronic kidney disease: Secondary | ICD-10-CM | POA: Diagnosis not present

## 2024-06-24 DIAGNOSIS — N1831 Chronic kidney disease, stage 3a: Secondary | ICD-10-CM | POA: Diagnosis not present

## 2024-06-24 DIAGNOSIS — N390 Urinary tract infection, site not specified: Secondary | ICD-10-CM | POA: Diagnosis not present

## 2024-06-24 DIAGNOSIS — R6521 Severe sepsis with septic shock: Secondary | ICD-10-CM | POA: Diagnosis not present

## 2024-06-24 DIAGNOSIS — A4151 Sepsis due to Escherichia coli [E. coli]: Secondary | ICD-10-CM | POA: Diagnosis not present

## 2024-06-24 DIAGNOSIS — N179 Acute kidney failure, unspecified: Secondary | ICD-10-CM | POA: Diagnosis not present

## 2024-06-24 DIAGNOSIS — I130 Hypertensive heart and chronic kidney disease with heart failure and stage 1 through stage 4 chronic kidney disease, or unspecified chronic kidney disease: Secondary | ICD-10-CM | POA: Diagnosis not present

## 2024-06-24 DIAGNOSIS — I5032 Chronic diastolic (congestive) heart failure: Secondary | ICD-10-CM | POA: Diagnosis not present

## 2024-06-24 NOTE — Telephone Encounter (Signed)
 Copied from CRM [id number]. Topic: General - Other >> Jun 24, 2024  1:47 PM Charlet HERO wrote: Reason for CRM: [facility] calling to let pcp know of findings, the patient was dizzy 94/48 hypotensive has been taking amlodepine for about a week she was told to hold off on it until seen by dr, her hydration, heart rate and everything else is good. [phone]

## 2024-06-25 ENCOUNTER — Encounter: Payer: Self-pay | Admitting: Family Medicine

## 2024-06-25 ENCOUNTER — Ambulatory Visit (INDEPENDENT_AMBULATORY_CARE_PROVIDER_SITE_OTHER): Payer: Self-pay | Admitting: Family Medicine

## 2024-06-25 ENCOUNTER — Telehealth: Payer: Self-pay

## 2024-06-25 ENCOUNTER — Other Ambulatory Visit: Payer: Self-pay

## 2024-06-25 ENCOUNTER — Other Ambulatory Visit: Payer: Self-pay | Admitting: Family Medicine

## 2024-06-25 VITALS — BP 128/76 | HR 75 | Resp 16 | Ht 69.0 in | Wt 190.0 lb

## 2024-06-25 DIAGNOSIS — A4151 Sepsis due to Escherichia coli [E. coli]: Secondary | ICD-10-CM | POA: Diagnosis not present

## 2024-06-25 DIAGNOSIS — R6521 Severe sepsis with septic shock: Secondary | ICD-10-CM

## 2024-06-25 DIAGNOSIS — I10 Essential (primary) hypertension: Secondary | ICD-10-CM

## 2024-06-25 DIAGNOSIS — R300 Dysuria: Secondary | ICD-10-CM | POA: Diagnosis not present

## 2024-06-25 DIAGNOSIS — E1169 Type 2 diabetes mellitus with other specified complication: Secondary | ICD-10-CM | POA: Diagnosis not present

## 2024-06-25 DIAGNOSIS — N178 Other acute kidney failure: Secondary | ICD-10-CM | POA: Diagnosis not present

## 2024-06-25 DIAGNOSIS — Z7689 Persons encountering health services in other specified circumstances: Secondary | ICD-10-CM

## 2024-06-25 MED ORDER — FLUOXETINE HCL 40 MG PO CAPS: 40.0000 mg | ORAL_CAPSULE | Freq: Every day | ORAL | 11 refills | Status: AC

## 2024-06-25 NOTE — Patient Instructions (Signed)
 Visit Information  Thank you for taking time to visit with me today. Please don't hesitate to contact me if I can be of assistance to you before our next scheduled telephone appointment.  Our next appointment is by telephone on 07/02/2024 at 10am  Following is a copy of your care plan:   Goals Addressed             This Visit's Progress    [facility] Transitions of Care (TOC) Care Plan       Problems:  Recent Hospitalization for treatment of recent admission for sepsis, syncopy08/06/2024  Patient reports that she was dizzy yesterday when PT came and her BP was high. Reports she was not able to finish her treatment.  Medication management barrier patient does not know all her medications, Family manages. and No Hospital Follow Up Provider appointment NO PCP hospital follow up yet scheduled.  06/25/2024  Patient reports that she is taking all her medications as prescribed.  Has follow up today with Dr. Antonetta. Son is taking patient to appointment.   Goal:  Over the next 30 days, the patient will not experience hospital readmission  Interventions:  Transitions of Care: Doctor Visits  - discussed the importance of doctor visits Reviewed importance of hydration Reviewed low salt diet Reviewed need for daily weights- todays weight after breakfast of 183 pounds Reviewed medications to the best of patients ability. Spoke with son who is living with patient and he reports patient has all medications but not the bottles. Reviewed PCP follow up scheduled for later today Reviewed DM under control with diet. No meds. CBG today of 165 after breakfast.  Assessed that patient is sleeping well. Confirmed patient has transportation to office visit today., Encouraged patient to keep a BP log and check BP daily.    Patient Self Care Activities:  Attend all scheduled provider appointments Call pharmacy for medication refills 3-7 days in advance of running out of medications Call provider office for new  concerns or questions  Notify RN Care Manager of TOC call rescheduling needs Participate in Transition of Care Program/Attend TOC scheduled calls Take medications as prescribed   Weigh daily and record weights Follow low salt and DM diet Keep home health appointments.  Check blood pressure daily and write on paper to take to MD office Check weight daily and write it down.    Plan:  Telephone follow up appointment with care management team member scheduled for:  07/02/2024 The patient has been provided with contact information for the care management team and has been advised to call with any health related questions or concerns.         Patient verbalizes understanding of instructions and care plan provided today and agrees to view in MyChart. Active MyChart status and patient understanding of how to access instructions and care plan via MyChart confirmed with patient.     Telephone follow up appointment with care management team member scheduled for: 07/02/2024  at 10 am  Please call the care guide team at [phone] if you need to cancel or reschedule your appointment.   Please call the Suicide and Crisis Lifeline: 988 call the [place]  National Suicide Prevention Lifeline: 770 291 5603 or TTY: (253)098-6138 TTY (323)316-5004) to talk to a trained counselor call 1-800-273-TALK (toll free, 24 hour hotline) call 911 if you are experiencing a Mental Health or Behavioral Health Crisis or need someone to talk to.  Alan Ee, RN, BSN, CEN Population Health- Transition of Care Team.  [facility] [phone]

## 2024-06-25 NOTE — Telephone Encounter (Signed)
 Copied from CRM [id number]. Topic: Clinical - Prescription Issue >> Jun 25, 2024 11:55 AM Tobias CROME wrote: Reason for CRM: Andrea w/ [facility] calling to check on faxed request for fluoxetine . They are now patient's preferred pharmacy and are needing prescription sent to them.  Prescription showing sent to [facility] on 06/23/24. Requesting for rx to be sent to [facility]. They do a monthly pill pack for patient and the fluoxetine  is a part of that. They are wanting to send it out today.   Please assist further.

## 2024-06-25 NOTE — Transitions of Care (Post Inpatient/ED Visit) (Signed)
 Transition of Care week 2  Visit Note  06/25/2024  Name: Sophia [name] MRN: [id number]          DOB: 29-Jul-1951  Situation: Patient enrolled in [facility] 30-day program. Visit completed with patient  by telephone.   Background:   Initial Transition Care Management Follow-up Telephone Call    Past Medical History:  Diagnosis Date   Acute respiratory disease due to COVID-19 virus 08/28/2019   Anxiety    Arthritis    Depression    Diabetes mellitus type 2 in obese 11/28/2010   Qualifier: Diagnosis of  By: Antonetta MD, Margaret  Diet controlled in 12/2016    Diabetes mellitus without complication (HCC)    GERD (gastroesophageal reflux disease)    Hypercholesteremia    Hypertension    PONV (postoperative nausea and vomiting)    Sleep apnea     Assessment: Patient reports that she is doing well. Reports high blood pressure and dizzy episodes with PT yesterday.  Reports taking all medications as prescribed, Report niece fills pill box.  Patient Reported Symptoms: Cognitive Cognitive Status: Able to follow simple commands, Alert and oriented to person, place, and time, Normal speech and language skills      Neurological Neurological Review of Symptoms: No symptoms reported    HEENT HEENT Symptoms Reported: No symptoms reported      Cardiovascular Cardiovascular Symptoms Reported: Swelling in legs or feet, Dizziness Does patient have uncontrolled Hypertension?: No Weight: 183 lb (83 kg) Cardiovascular Self-Management Outcome: 4 (good) Cardiovascular Comment: Paitent reports that she got dizzy yesterday during a PT session.  Reports her BP was 300.  ( I am not sure she is correct- She reports PT call MD office)   Reports no dizziness today. States slight swelling to lower legs and feet.  Respiratory Respiratory Symptoms Reported: No symptoms reported    Endocrine Endocrine Symptoms Reported: No symptoms reported Is patient diabetic?: Yes Is patient checking blood sugars at home?:  Yes List most recent blood sugar readings, include date and time of day: 165 after breakfast today during call    Gastrointestinal Gastrointestinal Symptoms Reported: No symptoms reported Additional Gastrointestinal Details: Reports bowels are moving well. Reports good appetite Gastrointestinal Self-Management Outcome: 4 (good)    Genitourinary Genitourinary Symptoms Reported: No symptoms reported    Integumentary Integumentary Symptoms Reported: No symptoms reported    Musculoskeletal Musculoskelatal Symptoms Reviewed: Difficulty walking Additional Musculoskeletal Details: continues to use her walker        Psychosocial Psychosocial Symptoms Reported: No symptoms reported         Vitals:   06/25/24 0955  BP: 114/62  Pulse: 80   Today's Vitals   06/25/24 0955  BP: 114/62  Pulse: 80  Weight: 183 lb (83 kg)    Medications Reviewed Today     Reviewed by Rumalda Alan PENNER, RN (Registered Nurse) on 06/25/24 at [phone]  Med List Status: <None>   Medication Order Taking? Sig Documenting Provider Last Dose Status Informant  acetaminophen  (TYLENOL ) 650 MG CR tablet [id number] Yes Take 650 mg by mouth 2 (two) times daily. [provider]  Active Pharmacy Records, Family Member  albuterol  (VENTOLIN  HFA) 108 [phone] Base) MCG/ACT inhaler [id number] Yes Inhale 2 puffs into the lungs every 6 (six) hours as needed for wheezing or shortness of breath. Antonetta Rollene BRAVO, MD  Active Pharmacy Records, Family Member  ASPIRIN  EC PO [id number] Yes Take 325 mg by mouth daily. Swallow whole. [provider]  Active Pharmacy Records, Family Member  busPIRone  (BUSPAR ) 30 MG tablet [id number] Yes TAKE ONE (1) TABLET BY MOUTH TWICE DAILY *NEW PRESCRIPTION REQUEST* Antonetta Rollene BRAVO, MD  Active   Cyanocobalamin  (VITAMIN B-12 PO) [id number] Yes 1 tablet. Take 2 gummies daily. [provider]  Active Pharmacy Records, Family Member  dapagliflozin  propanediol (FARXIGA ) 10 MG TABS tablet  [id number] Yes Take 1 tablet (10 mg total) by mouth daily. Mallipeddi, Diannah SQUIBB, MD  Active Pharmacy Records, Family Member           Med Note (WARD, CHUCK KANDICE Repress Jun 13, 2024  7:24 PM) Pt started taking this 6 days ago and the pt's family are worried it is causing an adverse affect  ferrous gluconate  (FERGON) 324 MG tablet [id number] Yes Take 324 mg by mouth daily with breakfast. [provider]  Active Pharmacy Records, Family Member  FLUoxetine  (PROZAC ) 40 MG capsule [id number] Yes TAKE ONE (1) CAPSULE BY MOUTH ONCE DAILY *NEW PRESCRIPTION REQUESTDEWAINE Antonetta Rollene BRAVO, MD  Active   gabapentin  (NEURONTIN ) 300 MG capsule [id number] Yes Take 1 capsule (300 mg total) by mouth 2 (two) times daily. Kerrin Elspeth BROCKS, MD  Active Pharmacy Records, Family Member           Med Note [phone], CHUCK KANDICE Repress Jun 13, 2024  7:21 PM) Caused excessive drowsiness   Melatonin 1 MG CHEW [id number] Yes Chew 2 mg by mouth at bedtime. Antonetta Rollene BRAVO, MD  Active Pharmacy Records, Family Member  Multiple Vitamin (MULTIVITAMIN) tablet [id number] Yes Take 1 tablet by mouth daily. [provider]  Active Pharmacy Records, Family Member  olmesartan -hydrochlorothiazide  (BENICAR  HCT) 20-12.5 MG tablet [id number] Yes TAKE ONE TABLET BY MOUTH EVERY DAY Wert, Michael B, MD  Active Pharmacy Records, Family Member  omeprazole  [facility]) 40 MG capsule [id number] Yes TAKE 1 CAPSULE TWICE DAILY FOR 2 MONTH ONLY Cindie Carlin POUR, DO  Active Pharmacy Records, Family Member           Med Note (WARD, CHUCK KANDICE Repress Jun 13, 2024  4:56 PM)    Phenylephrine -Witch Hazel (HEMORRHOIDAL COOLING) 0.25-50 % GEL [id number] Yes Applied twice a day as needed for hemorrhoids Ricky Fines, MD  Active   polyethylene glycol (MIRALAX  / GLYCOLAX ) 17 g packet [id number] Yes Take 17 g by mouth daily as needed for moderate constipation. [provider]  Active Pharmacy Records, Family Member  rosuvastatin  (CRESTOR ) 20 MG  tablet [id number] Yes TAKE 1 TABLET BY MOUTH EVERY DAY *NEW PRESCRIPTION REQUEST* Antonetta Rollene BRAVO, MD  Active   sucralfate  (CARAFATE ) 1 g tablet [id number] Yes TAKE ONE (1) TABLET BY MOUTH TWICE DAILY *NEW PRESCRIPTION REQUESTDEWAINE Antonetta Rollene BRAVO, MD  Active   traZODone  (DESYREL ) 50 MG tablet [id number] Yes TAKE 1/2-1 TABLET BY MOUTH EVERY NIGHT AT BEDTIME AS NEEDED FOR SLEEP *NEW PRESCRIPTION REQUEST* Antonetta Rollene BRAVO, MD  Active   Med List Note (Ward, Angelica, CPhT 06/13/24 1927): Pt's niece helped with med rec            Goals Addressed             This Visit's Progress    [facility]I Transitions of Care (TOC) Care Plan       Problems:  Recent Hospitalization for treatment of recent admission for sepsis, syncopy08/06/2024  Patient reports that she was dizzy yesterday when PT came and her BP was high. Reports she was not able to finish her treatment.  Medication management barrier patient does not know all her medications,  Family manages. and No Hospital Follow Up Provider appointment NO PCP hospital follow up yet scheduled.  06/25/2024  Patient reports that she is taking all her medications as prescribed.  Has follow up today with Dr. Antonetta. Son is taking patient to appointment.   Goal:  Over the next 30 days, the patient will not experience hospital readmission  Interventions:  Transitions of Care: Doctor Visits  - discussed the importance of doctor visits Reviewed importance of hydration Reviewed low salt diet Reviewed need for daily weights- todays weight after breakfast of 183 pounds Reviewed medications to the best of patients ability. Spoke with son who is living with patient and he reports patient has all medications but not the bottles. Reviewed PCP follow up scheduled for later today Reviewed DM under control with diet. No meds. CBG today of 165 after breakfast.  Assessed that patient is sleeping well. Confirmed patient has transportation to office visit  today., Encouraged patient to keep a BP log and check BP daily.    Patient Self Care Activities:  Attend all scheduled provider appointments Call pharmacy for medication refills 3-7 days in advance of running out of medications Call provider office for new concerns or questions  Notify RN Care Manager of TOC call rescheduling needs Participate in Transition of Care Program/Attend TOC scheduled calls Take medications as prescribed   Weigh daily and record weights Follow low salt and DM diet Keep home health appointments.  Check blood pressure daily and write on paper to take to MD office Check weight daily and write it down.    Plan:  Telephone follow up appointment with care management team member scheduled for:  07/02/2024 The patient has been provided with contact information for the care management team and has been advised to call with any health related questions or concerns.           Recommendation:   Continue Current Plan of Care See MD today at 4pm  Follow Up Plan:   Telephone follow up appointment date/time:  07/02/2024 at 10 am  Alan Ee, RN, BSN, [employer]- Transition of Care Team.  [facility] [phone]

## 2024-06-25 NOTE — Telephone Encounter (Signed)
 Sent!

## 2024-06-25 NOTE — Patient Instructions (Signed)
 F/u as before  Take only the medications in your  pill pack  Labs today  Ensure 64 ounces water  every day  Thanks for choosing [facility], we consider it a privelige to serve you.

## 2024-06-27 ENCOUNTER — Ambulatory Visit: Payer: Self-pay | Admitting: Family Medicine

## 2024-06-27 ENCOUNTER — Encounter: Payer: Self-pay | Admitting: Family Medicine

## 2024-06-27 DIAGNOSIS — Z7689 Persons encountering health services in other specified circumstances: Secondary | ICD-10-CM | POA: Insufficient documentation

## 2024-06-27 NOTE — Assessment & Plan Note (Signed)
 Controlled, no change in medication

## 2024-06-27 NOTE — Progress Notes (Signed)
   Sophia [name]     MRN: [id number]      DOB: 12-31-1950  Chief Complaint  Patient presents with   Hospitalization Follow-up    Admitted 07/27 for sepsis     HPI Sophia [name] is here for follow up of recent hospitalization, and transition of care visit, she was hospitalized  between 7/27 and 7/31 for urosepsis Still feels weak but generally has improved No malodorous urine , flank pain, nauseaor vomiting now ROS Denies recent fever or chills. Denies sinus pressure, nasal congestion, ear pain or sore throat. Denies chest congestion, productive cough or wheezing. Denies chest pains, palpitations and leg swelling Denies abdominal pain, nausea, vomiting,diarrhea or constipation.   Denies dysuria, frequency, hesitancy or incontinence. Denies joint pain, swelling and limitation in mobility. Denies headaches, seizures, numbness, or tingling. Denies depression, anxiety or insomnia. Denies skin break down or rash.   PE  BP 128/76   Pulse 75   Resp 16   Ht 5' 9 (1.753 m)   Wt 190 lb 0.6 oz (86.2 kg)   SpO2 92%   BMI 28.06 kg/m   Patient alert and oriented and in no cardiopulmonary distress.  HEENT: No facial asymmetry, EOMI,     Neck supple .  Chest: Clear to auscultation bilaterally.  CVS: S1, S2 no murmurs, no S3.Regular rate.  ABD: Soft non tender.   Ext: No edema  MS: Adequate ROM spine, shoulders, hips and knees.  Skin: Intact, no ulcerations or rash noted.  Psych: Good eye contact, normal affect. Memory intact not anxious or depressed appearing.  CNS: CN 2-12 intact, power,  normal throughout.no focal deficits noted.   Assessment & Plan  Sepsis due to Escherichia coli with acute renal failure and septic shock (HCC) Reso;ved , improved to 80 % baseline, still weak, encoiuraged to ensure 64 oz water  daily and to void often Will rept urine c/s  Encounter for support and coordination of transition of care Patient in for follow up of recent  hospitalization. Discharge summary, and laboratory and radiology data are reviewed, and any questions or concerns  are discussed. Specific issues requiring follow up are specifically addressed.   Essential hypertension Controlled, no change in medication

## 2024-06-27 NOTE — Assessment & Plan Note (Signed)
Patient in for follow up of recent hospitalization. Discharge summary, and laboratory and radiology data are reviewed, and any questions or concerns  are discussed. Specific issues requiring follow up are specifically addressed.  

## 2024-06-27 NOTE — Assessment & Plan Note (Signed)
 Reso;ved , improved to 80 % baseline, still weak, encoiuraged to ensure 64 oz water  daily and to void often Will rept urine c/s

## 2024-06-28 DIAGNOSIS — R6521 Severe sepsis with septic shock: Secondary | ICD-10-CM | POA: Diagnosis not present

## 2024-06-28 DIAGNOSIS — C3491 Malignant neoplasm of unspecified part of right bronchus or lung: Secondary | ICD-10-CM | POA: Diagnosis not present

## 2024-06-28 DIAGNOSIS — N179 Acute kidney failure, unspecified: Secondary | ICD-10-CM | POA: Diagnosis not present

## 2024-06-28 DIAGNOSIS — E1122 Type 2 diabetes mellitus with diabetic chronic kidney disease: Secondary | ICD-10-CM | POA: Diagnosis not present

## 2024-06-28 DIAGNOSIS — I5032 Chronic diastolic (congestive) heart failure: Secondary | ICD-10-CM | POA: Diagnosis not present

## 2024-06-28 DIAGNOSIS — I130 Hypertensive heart and chronic kidney disease with heart failure and stage 1 through stage 4 chronic kidney disease, or unspecified chronic kidney disease: Secondary | ICD-10-CM | POA: Diagnosis not present

## 2024-06-28 DIAGNOSIS — N1831 Chronic kidney disease, stage 3a: Secondary | ICD-10-CM | POA: Diagnosis not present

## 2024-06-28 DIAGNOSIS — N390 Urinary tract infection, site not specified: Secondary | ICD-10-CM | POA: Diagnosis not present

## 2024-06-28 DIAGNOSIS — A4151 Sepsis due to Escherichia coli [E. coli]: Secondary | ICD-10-CM | POA: Diagnosis not present

## 2024-06-28 LAB — CBC WITH DIFFERENTIAL/PLATELET
Basophils Absolute: 0.1 x10E3/uL (ref 0.0–0.2)
Basos: 1 %
EOS (ABSOLUTE): 0.1 x10E3/uL (ref 0.0–0.4)
Eos: 2 %
Hematocrit: 32.1 % — ABNORMAL LOW (ref 34.0–46.6)
Hemoglobin: 9.6 g/dL — ABNORMAL LOW (ref 11.1–15.9)
Immature Grans (Abs): 0 x10E3/uL (ref 0.0–0.1)
Immature Granulocytes: 0 %
Lymphocytes Absolute: 2.5 x10E3/uL (ref 0.7–3.1)
Lymphs: 43 %
MCH: 25.5 pg — ABNORMAL LOW (ref 26.6–33.0)
MCHC: 29.9 g/dL — ABNORMAL LOW (ref 31.5–35.7)
MCV: 85 fL (ref 79–97)
Monocytes Absolute: 0.5 x10E3/uL (ref 0.1–0.9)
Monocytes: 8 %
Neutrophils Absolute: 2.7 x10E3/uL (ref 1.4–7.0)
Neutrophils: 45 %
Platelets: 374 x10E3/uL (ref 150–450)
RBC: 3.77 x10E6/uL (ref 3.77–5.28)
RDW: 14.4 % (ref 11.7–15.4)
WBC: 5.8 x10E3/uL (ref 3.4–10.8)

## 2024-06-28 LAB — UA/M W/RFLX CULTURE, ROUTINE
Bilirubin, UA: NEGATIVE
Glucose, UA: NEGATIVE
Ketones, UA: NEGATIVE
Leukocytes,UA: NEGATIVE
Nitrite, UA: NEGATIVE
RBC, UA: NEGATIVE
Specific Gravity, UA: 1.02 (ref 1.005–1.030)
Urobilinogen, Ur: 0.2 mg/dL (ref 0.2–1.0)
pH, UA: 5.5 (ref 5.0–7.5)

## 2024-06-28 LAB — CMP14+EGFR
ALT: 24 IU/L (ref 0–32)
AST: 17 IU/L (ref 0–40)
Albumin: 4.3 g/dL (ref 3.8–4.8)
Alkaline Phosphatase: 80 IU/L (ref 44–121)
BUN/Creatinine Ratio: 29 — ABNORMAL HIGH (ref 12–28)
BUN: 26 mg/dL (ref 8–27)
Bilirubin Total: 0.2 mg/dL (ref 0.0–1.2)
CO2: 23 mmol/L (ref 20–29)
Calcium: 9.9 mg/dL (ref 8.7–10.3)
Chloride: 102 mmol/L (ref 96–106)
Creatinine, Ser: 0.9 mg/dL (ref 0.57–1.00)
Globulin, Total: 2.8 g/dL (ref 1.5–4.5)
Glucose: 85 mg/dL (ref 70–99)
Potassium: 3.9 mmol/L (ref 3.5–5.2)
Sodium: 142 mmol/L (ref 134–144)
Total Protein: 7.1 g/dL (ref 6.0–8.5)
eGFR: 67 mL/min/1.73 (ref >59)

## 2024-06-28 LAB — MICROSCOPIC EXAMINATION
Bacteria, UA: NONE SEEN
Casts: NONE SEEN /LPF

## 2024-06-28 LAB — URINE CULTURE, REFLEX

## 2024-07-01 DIAGNOSIS — I5032 Chronic diastolic (congestive) heart failure: Secondary | ICD-10-CM | POA: Diagnosis not present

## 2024-07-01 DIAGNOSIS — C3491 Malignant neoplasm of unspecified part of right bronchus or lung: Secondary | ICD-10-CM | POA: Diagnosis not present

## 2024-07-01 DIAGNOSIS — R6521 Severe sepsis with septic shock: Secondary | ICD-10-CM | POA: Diagnosis not present

## 2024-07-01 DIAGNOSIS — E1122 Type 2 diabetes mellitus with diabetic chronic kidney disease: Secondary | ICD-10-CM | POA: Diagnosis not present

## 2024-07-01 DIAGNOSIS — N1831 Chronic kidney disease, stage 3a: Secondary | ICD-10-CM | POA: Diagnosis not present

## 2024-07-01 DIAGNOSIS — N390 Urinary tract infection, site not specified: Secondary | ICD-10-CM | POA: Diagnosis not present

## 2024-07-01 DIAGNOSIS — N179 Acute kidney failure, unspecified: Secondary | ICD-10-CM | POA: Diagnosis not present

## 2024-07-01 DIAGNOSIS — I130 Hypertensive heart and chronic kidney disease with heart failure and stage 1 through stage 4 chronic kidney disease, or unspecified chronic kidney disease: Secondary | ICD-10-CM | POA: Diagnosis not present

## 2024-07-01 DIAGNOSIS — A4151 Sepsis due to Escherichia coli [E. coli]: Secondary | ICD-10-CM | POA: Diagnosis not present

## 2024-07-02 ENCOUNTER — Other Ambulatory Visit: Payer: Self-pay

## 2024-07-02 NOTE — Transitions of Care (Post Inpatient/ED Visit) (Signed)
 Transition of Care week 3  Visit Note  07/02/2024  Name: Sophia [name] MRN: [id number]          DOB: 1950-11-30  Situation: Patient enrolled in [facility] 30-day program. Visit completed with patient by telephone.   Background:   Initial Transition Care Management Follow-up Telephone Call    Past Medical History:  Diagnosis Date   Acute respiratory disease due to COVID-19 virus 08/28/2019   Anxiety    Arthritis    Depression    Diabetes mellitus type 2 in obese 11/28/2010   Qualifier: Diagnosis of  By: Antonetta MD, Margaret  Diet controlled in 12/2016    Diabetes mellitus without complication (HCC)    GERD (gastroesophageal reflux disease)    Hypercholesteremia    Hypertension    PONV (postoperative nausea and vomiting)    Sleep apnea     Assessment: Patient reports feeling better. No dizzy spells. Complaining of right hip pain Patient Reported Symptoms: Cognitive Cognitive Status: Able to follow simple commands, Alert and oriented to person, place, and time, Normal speech and language skills      Neurological Neurological Review of Symptoms: No symptoms reported    HEENT HEENT Symptoms Reported: No symptoms reported      Cardiovascular Cardiovascular Symptoms Reported: No symptoms reported    Respiratory Respiratory Symptoms Reported: No symptoms reported    Endocrine Endocrine Symptoms Reported: No symptoms reported Is patient diabetic?: Yes Is patient checking blood sugars at home?: Yes List most recent blood sugar readings, include date and time of day: no reading today    Gastrointestinal Gastrointestinal Symptoms Reported: No symptoms reported      Genitourinary Genitourinary Symptoms Reported: No symptoms reported    Integumentary Integumentary Symptoms Reported: No symptoms reported    Musculoskeletal Musculoskelatal Symptoms Reviewed: Other Other Musculoskeletal Symptoms: reports severe right hip pain- taking tylenol  and using heating pad         Psychosocial Psychosocial Symptoms Reported: No symptoms reported         Today's Vitals   07/02/24 1008  PainSc: 10-Worst pain ever  PainLoc: Hip     Medications Reviewed Today     Reviewed by Rumalda Alan PENNER, RN (Registered Nurse) on 07/02/24 at 1000  Med List Status: <None>   Medication Order Taking? Sig Documenting Provider Last Dose Status Informant  acetaminophen  (TYLENOL ) 650 MG CR tablet [id number] Yes Take 650 mg by mouth 2 (two) times daily. [provider]  Active Pharmacy Records, Family Member  albuterol  (VENTOLIN  HFA) 108 [phone] Base) MCG/ACT inhaler [id number] Yes Inhale 2 puffs into the lungs every 6 (six) hours as needed for wheezing or shortness of breath. Antonetta Rollene BRAVO, MD  Active Pharmacy Records, Family Member  ASPIRIN  EC PO [id number] Yes Take 325 mg by mouth daily. Swallow whole. [provider]  Active Pharmacy Records, Family Member  busPIRone  (BUSPAR ) 30 MG tablet [id number] Yes TAKE ONE (1) TABLET BY MOUTH TWICE DAILY *NEW PRESCRIPTION REQUESTDEWAINE Antonetta Rollene BRAVO, MD  Active   chlorthalidone  (HYGROTON ) 25 MG tablet [id number] Yes Take 25 mg by mouth daily. [provider]  Active   Cyanocobalamin  (VITAMIN B-12 PO) [id number] Yes 1 tablet. Take 2 gummies daily. [provider]  Active Pharmacy Records, Family Member  dapagliflozin  propanediol (FARXIGA ) 10 MG TABS tablet [id number] Yes Take 1 tablet (10 mg total) by mouth daily. Mallipeddi, Diannah SQUIBB, MD  Active Pharmacy Records, Family Member           Med Note (WARD, ANGELICA G  Sun Jun 13, 2024  7:24 PM) Pt started taking this 6 days ago and the pt's family are worried it is causing an adverse affect  ferrous gluconate  (FERGON) 324 MG tablet [id number] Yes Take 324 mg by mouth daily with breakfast. [provider]  Active Pharmacy Records, Family Member  FLUoxetine  (PROZAC ) 40 MG capsule [id number] Yes Take 1 capsule (40 mg total) by mouth daily. Antonetta Rollene BRAVO, MD   Active   gabapentin  (NEURONTIN ) 300 MG capsule [id number] Yes Take 1 capsule (300 mg total) by mouth 2 (two) times daily. Kerrin Elspeth BROCKS, MD  Active Pharmacy Records, Family Member           Med Note [phone], CHUCK KANDICE Repress Jun 13, 2024  7:21 PM) Caused excessive drowsiness   Melatonin 1 MG CHEW [id number] Yes Chew 2 mg by mouth at bedtime. Antonetta Rollene BRAVO, MD  Active Pharmacy Records, Family Member  Multiple Vitamin (MULTIVITAMIN) tablet [id number] Yes Take 1 tablet by mouth daily. [provider]  Active Pharmacy Records, Family Member  olmesartan -hydrochlorothiazide  (BENICAR  HCT) 20-12.5 MG tablet [id number] Yes TAKE ONE TABLET BY MOUTH EVERY DAY Wert, Michael B, MD  Active Pharmacy Records, Family Member  omeprazole  [facility]) 40 MG capsule [id number] Yes TAKE 1 CAPSULE TWICE DAILY FOR 2 MONTH ONLY Cindie Carlin POUR, DO  Active Pharmacy Records, Family Member           Med Note [phone], CHUCK KANDICE Repress Jun 13, 2024  4:56 PM)    Phenylephrine -Witch Hazel (HEMORRHOIDAL COOLING) 0.25-50 % GEL [id number] Yes Applied twice a day as needed for hemorrhoids Ricky Fines, MD  Active   polyethylene glycol (MIRALAX  / GLYCOLAX ) 17 g packet [id number] Yes Take 17 g by mouth daily as needed for moderate constipation. [provider]  Active Pharmacy Records, Family Member  rosuvastatin  (CRESTOR ) 20 MG tablet [id number] Yes TAKE 1 TABLET BY MOUTH EVERY DAY *NEW PRESCRIPTION REQUEST* Antonetta Rollene BRAVO, MD  Active   sucralfate  (CARAFATE ) 1 g tablet [id number] Yes TAKE ONE (1) TABLET BY MOUTH TWICE DAILY *NEW PRESCRIPTION REQUESTDEWAINE Antonetta Rollene BRAVO, MD  Active   traZODone  (DESYREL ) 50 MG tablet [id number] Yes TAKE 1/2-1 TABLET BY MOUTH EVERY NIGHT AT BEDTIME AS NEEDED FOR SLEEP *NEW PRESCRIPTION REQUEST* Antonetta Rollene BRAVO, MD  Active   Med List Note (Ward, Angelica, CPhT 06/13/24 1927): Pt's niece helped with med rec            Goals Addressed             This Visit's Progress     [facility] Transitions of Care (TOC) Care Plan       Problems:  Recent Hospitalization for treatment of recent admission for sepsis, syncopy08/06/2024  Patient reports that she was dizzy yesterday when PT came and her BP was high. Reports she was not able to finish her treatment. 07/02/2024 No episodes this week, Medication management barrier patient does not know all her medications, Family manages. and No Hospital Follow Up Provider appointment NO PCP hospital follow up yet scheduled.  06/25/2024  Patient reports that she is taking all her medications as prescribed.  Has follow up today with Dr. Antonetta. Son is taking patient to appointment. 07/02/2024   Reports that she has feeling well.  Reports saw PCP for follow up. Denies any changes in medications.  Right hip pain- 07/02/2024 reports arthritis pain is worsening. Pain 10/10 today. Using heating pads and tylenol  .Reports that she will discuss with MD at  next office visit.    Goal:  Over the next 30 days, the patient will not experience hospital readmission  Interventions:  Transitions of Care: Doctor Visits  - discussed the importance of doctor visits Reviewed importance of hydration Reviewed low salt diet Reviewed need for daily weight Reviewed medications- reports no changes in medications and taking as prescribed.  Reviewed PCP follow up scheduled 2 weeks Encouraged patient to use heat or ice which ever works best for her pain Encouraged patient to continue to take medications as prescribed.     Patient Self Care Activities:  Attend all scheduled provider appointments Call pharmacy for medication refills 3-7 days in advance of running out of medications Call provider office for new concerns or questions  Notify RN Care Manager of TOC call rescheduling needs Participate in Transition of Care Program/Attend TOC scheduled calls Take medications as prescribed   Weigh daily and record weights Follow low salt and DM diet Keep home  health appointments.  Check blood pressure daily and write on paper to take to MD office Check weight daily and write it down.  Use heat or ice for hip pain   Plan:  Telephone follow up appointment with care management team member scheduled for:  07/09/2024 The patient has been provided with contact information for the care management team and has been advised to call with any health related questions or concerns.          Recommendation:   Continue Current Plan of Care  Follow Up Plan:   Telephone follow up appointment date/time:  07/09/2024  1130   Alan Ee, RN, BSN, CEN [facility]- Transition of Care Team.  [facility] [phone]

## 2024-07-02 NOTE — Patient Instructions (Signed)
 Visit Information  Thank you for taking time to visit with me today. Please don't hesitate to contact me if I can be of assistance to you before our next scheduled telephone appointment.  Our next appointment is by telephone on 07/09/2024 at 1130  Following is a copy of your care plan:   Goals Addressed             This Visit's Progress    [facility] Transitions of Care (TOC) Care Plan       Problems:  Recent Hospitalization for treatment of recent admission for sepsis, syncopy08/06/2024  Patient reports that she was dizzy yesterday when PT came and her BP was high. Reports she was not able to finish her treatment. 07/02/2024 No episodes this week, Medication management barrier patient does not know all her medications, Family manages. and No Hospital Follow Up Provider appointment NO PCP hospital follow up yet scheduled.  06/25/2024  Patient reports that she is taking all her medications as prescribed.  Has follow up today with Dr. Antonetta. Son is taking patient to appointment. 07/02/2024   Reports that she has feeling well.  Reports saw PCP for follow up. Denies any changes in medications.  Right hip pain- 07/02/2024 reports arthritis pain is worsening. Pain 10/10 today. Using heating pads and tylenol  .Reports that she will discuss with MD at next office visit.    Goal:  Over the next 30 days, the patient will not experience hospital readmission  Interventions:  Transitions of Care: Doctor Visits  - discussed the importance of doctor visits Reviewed importance of hydration Reviewed low salt diet Reviewed need for daily weight Reviewed medications- reports no changes in medications and taking as prescribed.  Reviewed PCP follow up scheduled 2 weeks Encouraged patient to use heat or ice which ever works best for her pain Encouraged patient to continue to take medications as prescribed.     Patient Self Care Activities:  Attend all scheduled provider appointments Call pharmacy for  medication refills 3-7 days in advance of running out of medications Call provider office for new concerns or questions  Notify RN Care Manager of TOC call rescheduling needs Participate in Transition of Care Program/Attend TOC scheduled calls Take medications as prescribed   Weigh daily and record weights Follow low salt and DM diet Keep home health appointments.  Check blood pressure daily and write on paper to take to MD office Check weight daily and write it down.  Use heat or ice for hip pain   Plan:  Telephone follow up appointment with care management team member scheduled for:  07/09/2024 The patient has been provided with contact information for the care management team and has been advised to call with any health related questions or concerns.         Patient verbalizes understanding of instructions and care plan provided today and agrees to view in MyChart. Active MyChart status and patient understanding of how to access instructions and care plan via MyChart confirmed with patient.     Telephone follow up appointment with care management team member scheduled for:  07/09/2024  Please call the care guide team at [phone] if you need to cancel or reschedule your appointment.   Please call the Suicide and Crisis Lifeline: 988 call the [place]  National Suicide Prevention Lifeline: (223)010-8529 or TTY: 785-715-8851 TTY 430-629-9718) to talk to a trained counselor call 1-800-273-TALK (toll free, 24 hour hotline) call 911 if you are experiencing a Mental Health or Behavioral Health Crisis or need  someone to talk to.  Alan Ee, RN, BSN, CEN [employer]- Transition of Care Team.  [facility] [phone]

## 2024-07-06 DIAGNOSIS — A4151 Sepsis due to Escherichia coli [E. coli]: Secondary | ICD-10-CM | POA: Diagnosis not present

## 2024-07-06 DIAGNOSIS — N1831 Chronic kidney disease, stage 3a: Secondary | ICD-10-CM | POA: Diagnosis not present

## 2024-07-06 DIAGNOSIS — I130 Hypertensive heart and chronic kidney disease with heart failure and stage 1 through stage 4 chronic kidney disease, or unspecified chronic kidney disease: Secondary | ICD-10-CM | POA: Diagnosis not present

## 2024-07-06 DIAGNOSIS — N179 Acute kidney failure, unspecified: Secondary | ICD-10-CM | POA: Diagnosis not present

## 2024-07-06 DIAGNOSIS — I5032 Chronic diastolic (congestive) heart failure: Secondary | ICD-10-CM | POA: Diagnosis not present

## 2024-07-06 DIAGNOSIS — C3491 Malignant neoplasm of unspecified part of right bronchus or lung: Secondary | ICD-10-CM | POA: Diagnosis not present

## 2024-07-06 DIAGNOSIS — R6521 Severe sepsis with septic shock: Secondary | ICD-10-CM | POA: Diagnosis not present

## 2024-07-06 DIAGNOSIS — N390 Urinary tract infection, site not specified: Secondary | ICD-10-CM | POA: Diagnosis not present

## 2024-07-06 DIAGNOSIS — E1122 Type 2 diabetes mellitus with diabetic chronic kidney disease: Secondary | ICD-10-CM | POA: Diagnosis not present

## 2024-07-09 ENCOUNTER — Ambulatory Visit (INDEPENDENT_AMBULATORY_CARE_PROVIDER_SITE_OTHER): Admitting: Family Medicine

## 2024-07-09 ENCOUNTER — Ambulatory Visit (HOSPITAL_COMMUNITY)
Admission: RE | Admit: 2024-07-09 | Discharge: 2024-07-09 | Disposition: A | Discharge status: Home / Self Care | Source: Ambulatory Visit | Attending: Family Medicine | Admitting: Family Medicine

## 2024-07-09 ENCOUNTER — Other Ambulatory Visit: Payer: Self-pay | Admitting: Family Medicine

## 2024-07-09 ENCOUNTER — Other Ambulatory Visit: Payer: Self-pay

## 2024-07-09 ENCOUNTER — Encounter: Payer: Self-pay | Admitting: Family Medicine

## 2024-07-09 ENCOUNTER — Ambulatory Visit: Payer: Self-pay | Admitting: Family Medicine

## 2024-07-09 VITALS — BP 124/70 | HR 81 | Resp 18 | Ht 69.0 in | Wt 187.0 lb

## 2024-07-09 DIAGNOSIS — M25551 Pain in right hip: Secondary | ICD-10-CM | POA: Insufficient documentation

## 2024-07-09 DIAGNOSIS — R1031 Right lower quadrant pain: Secondary | ICD-10-CM | POA: Diagnosis not present

## 2024-07-09 DIAGNOSIS — I10 Essential (primary) hypertension: Secondary | ICD-10-CM

## 2024-07-09 DIAGNOSIS — E785 Hyperlipidemia, unspecified: Secondary | ICD-10-CM

## 2024-07-09 DIAGNOSIS — F418 Other specified anxiety disorders: Secondary | ICD-10-CM

## 2024-07-09 DIAGNOSIS — G8929 Other chronic pain: Secondary | ICD-10-CM

## 2024-07-09 DIAGNOSIS — M1611 Unilateral primary osteoarthritis, right hip: Secondary | ICD-10-CM | POA: Diagnosis not present

## 2024-07-09 MED ORDER — OXYCODONE-ACETAMINOPHEN 5-325 MG PO TABS: ORAL_TABLET | ORAL | 0 refills | Status: DC

## 2024-07-09 MED ORDER — BUPROPION HCL ER (XL) 150 MG PO TB24: 150.0000 mg | ORAL_TABLET | Freq: Every day | ORAL | 3 refills | Status: DC

## 2024-07-09 NOTE — Patient Instructions (Signed)
 Visit Information  Thank you for taking time to visit with me today. Please don't hesitate to contact me if I can be of assistance to you before our next scheduled telephone appointment.  Our next appointment is by telephone on 07/15/2024 at 2pm  Following is a copy of your care plan:   Goals Addressed             This Visit's Progress    [facility] Transitions of Care (TOC) Care Plan       Problems:  Recent Hospitalization for treatment of recent admission for sepsis, syncopy08/06/2024  Patient reports that she was dizzy yesterday when PT came and her BP was high. Reports she was not able to finish her treatment. 07/02/2024 No episodes this week,  07/09/2024 Denies any dizzy spells Medication management barrier patient does not know all her medications, Family manages. and No Hospital Follow Up Provider appointment NO PCP hospital follow up yet scheduled.  06/25/2024  Patient reports that she is taking all her medications as prescribed.  Has follow up today with Dr. Antonetta. Son is taking patient to appointment. 07/02/2024   Reports that she has feeling well.  Reports saw PCP for follow up. Denies any changes in medications. 07/09/2024  Reports she saw PCP today and was ordered pain medications and xrays.  Right hip pain- 07/02/2024 reports arthritis pain is worsening. Pain 10/10 today. Using heating pads and tylenol  .Reports that she will discuss with MD at next office visit. 07/09/2024  Reports severe pain right hip/groin area.  States she had xray and will get her pain medications today.  Reports she is using a walker.    Goal:  Over the next 30 days, the patient will not experience hospital readmission  Interventions:  Transitions of Care: Doctor Visits  - discussed the importance of doctor visits Reviewed importance of hydration Reviewed low salt diet Reviewed need for daily weight Reviewed medications- reports no changes in medications and taking as prescribed.  Reviewed PCP follow up  scheduled 2 weeks Encouraged patient to use heat or ice which ever works best for her pain Encouraged patient to continue to take medications as prescribed.  Reviewed pending xray and new RX for pain.    Patient Self Care Activities:  Attend all scheduled provider appointments Call pharmacy for medication refills 3-7 days in advance of running out of medications Call provider office for new concerns or questions  Notify RN Care Manager of TOC call rescheduling needs Participate in Transition of Care Program/Attend TOC scheduled calls Take medications as prescribed   Weigh daily and record weights Follow low salt and DM diet Keep home health appointments.  Check blood pressure daily and write on paper to take to MD office Check weight daily and write it down.  Use heat or ice for hip pain Take new medication as prescribed.   Plan:  Telephone follow up appointment with care management team member scheduled for:  07/13/2024 The patient has been provided with contact information for the care management team and has been advised to call with any health related questions or concerns.         Patient verbalizes understanding of instructions and care plan provided today and agrees to view in MyChart. Active MyChart status and patient understanding of how to access instructions and care plan via MyChart confirmed with patient.      Telephone follow up appointment with care management team member scheduled for:   07/15/2024  at 2pm  Please call the  care guide team at [phone] if you need to cancel or reschedule your appointment.   Please call the Suicide and Crisis Lifeline: 988 call the [place]  National Suicide Prevention Lifeline: 9158496807 or TTY: 954-033-4545 TTY 256 097 1777) to talk to a trained counselor call 1-800-273-TALK (toll free, 24 hour hotline) call 911 if you are experiencing a Mental Health or Behavioral Health Crisis or need someone to talk to.  Alan Ee,  RN, BSN, CEN [employer]- Transition of Care Team.  [facility] [phone]

## 2024-07-09 NOTE — Transitions of Care (Post Inpatient/ED Visit) (Signed)
 Transition of Care week 4  Visit Note  07/09/2024  Name: Sophia [name] MRN: [id number]          DOB: Mar 11, 1951  Situation: Patient enrolled in [facility] 30-day program. Visit completed with patient by telephone.   Background:   Initial Transition Care Management Follow-up Telephone Call    Past Medical History:  Diagnosis Date   Acute respiratory disease due to COVID-19 virus 08/28/2019   Anxiety    Arthritis    Depression    Diabetes mellitus type 2 in obese 11/28/2010   Qualifier: Diagnosis of  By: Antonetta MD, Margaret  Diet controlled in 12/2016    Diabetes mellitus without complication (HCC)    GERD (gastroesophageal reflux disease)    Hypercholesteremia    Hypertension    PONV (postoperative nausea and vomiting)    Sleep apnea     Assessment:  Patient reports severe pain in her groin. Saw PCP today and a xray was ordered. New prescription for pain medications. Niece to pick up later today.  Patient Reported Symptoms: Cognitive Cognitive Status: Able to follow simple commands, Alert and oriented to person, place, and time, Normal speech and language skills      Neurological Neurological Review of Symptoms: No symptoms reported    HEENT HEENT Symptoms Reported: No symptoms reported      Cardiovascular Cardiovascular Symptoms Reported: No symptoms reported Weight:  (taken at MD office today) Cardiovascular Comment: Denies any recent dizzy spells.  Reports weight unchanged and her BP doing well.  Respiratory Respiratory Symptoms Reported: No symptoms reported    Endocrine Endocrine Symptoms Reported: No symptoms reported Is patient diabetic?: Yes List most recent blood sugar readings, include date and time of day: no reading today. Just left MD office    Gastrointestinal Gastrointestinal Symptoms Reported: No symptoms reported      Genitourinary Genitourinary Symptoms Reported: No symptoms reported    Integumentary Integumentary Symptoms Reported: No symptoms  reported    Musculoskeletal Musculoskelatal Symptoms Reviewed: Joint pain Other Musculoskeletal Symptoms: groin pain Additional Musculoskeletal Details: saw PCP today. ordered xray and pain medications,  10/10 Musculoskeletal Self-Management Outcome: 3 (uncertain)      Psychosocial Psychosocial Symptoms Reported: Not assessed         Today's Vitals   07/09/24 1148  PainSc: 10-Worst pain ever     Medications Reviewed Today     Reviewed by Rumalda Alan PENNER, RN (Registered Nurse) on 07/09/24 at 1140  Med List Status: <None>   Medication Order Taking? Sig Documenting Provider Last Dose Status Informant  acetaminophen  (TYLENOL ) 650 MG CR tablet [id number] Yes Take 650 mg by mouth 2 (two) times daily. [provider]  Active Pharmacy Records, Family Member  albuterol  (VENTOLIN  HFA) 108 [phone] Base) MCG/ACT inhaler [id number] Yes Inhale 2 puffs into the lungs every 6 (six) hours as needed for wheezing or shortness of breath. Antonetta Rollene BRAVO, MD  Active Pharmacy Records, Family Member  ASPIRIN  EC PO [id number] Yes Take 325 mg by mouth daily. Swallow whole. [provider]  Active Pharmacy Records, Family Member  buPROPion  (WELLBUTRIN  XL) 150 MG 24 hr tablet [id number] Yes Take 1 tablet (150 mg total) by mouth daily. Antonetta Rollene BRAVO, MD  Active   busPIRone  (BUSPAR ) 30 MG tablet [id number] Yes TAKE ONE (1) TABLET BY MOUTH TWICE DAILY *NEW PRESCRIPTION REQUESTDEWAINE Antonetta Rollene BRAVO, MD  Active   chlorthalidone  (HYGROTON ) 25 MG tablet [id number] Yes Take 25 mg by mouth daily. [provider]  Active   Cyanocobalamin  (VITAMIN  B-12 PO) [id number] Yes 1 tablet. Take 2 gummies daily. [provider]  Active Pharmacy Records, Family Member  dapagliflozin  propanediol (FARXIGA ) 10 MG TABS tablet [id number] Yes Take 1 tablet (10 mg total) by mouth daily. Mallipeddi, Diannah SQUIBB, MD  Active Pharmacy Records, Family Member           Med Note (WARD, CHUCK KANDICE Repress Jun 13, 2024   7:24 PM) Pt started taking this 6 days ago and the pt's family are worried it is causing an adverse affect  ferrous gluconate  (FERGON) 324 MG tablet [id number] Yes Take 324 mg by mouth daily with breakfast. [provider]  Active Pharmacy Records, Family Member  FLUoxetine  (PROZAC ) 40 MG capsule [id number] Yes Take 1 capsule (40 mg total) by mouth daily. Antonetta Rollene BRAVO, MD  Active   gabapentin  (NEURONTIN ) 300 MG capsule [id number] Yes Take 1 capsule (300 mg total) by mouth 2 (two) times daily. Kerrin Elspeth BROCKS, MD  Active Pharmacy Records, Family Member           Med Note [phone], CHUCK KANDICE Repress Jun 13, 2024  7:21 PM) Caused excessive drowsiness   Melatonin 1 MG CHEW [id number] Yes Chew 2 mg by mouth at bedtime. Antonetta Rollene BRAVO, MD  Active Pharmacy Records, Family Member  Multiple Vitamin (MULTIVITAMIN) tablet [id number] Yes Take 1 tablet by mouth daily. [provider]  Active Pharmacy Records, Family Member  olmesartan -hydrochlorothiazide  (BENICAR  HCT) 20-12.5 MG tablet [id number] Yes TAKE ONE TABLET BY MOUTH EVERY DAY Wert, Michael B, MD  Active Pharmacy Records, Family Member  omeprazole  [facility]) 40 MG capsule [id number] Yes TAKE 1 CAPSULE TWICE DAILY FOR 2 MONTH ONLY Cindie Carlin POUR, DO  Active Pharmacy Records, Family Member           Med Note (WARD, ANGELICA G   Sun Jun 13, 2024  4:56 PM)    oxyCODONE -acetaminophen  (PERCOCET/ROXICET) 5-325 MG tablet [id number] Yes Take one tablet by mouth every 8 hours as needed, for rigth groin pain Antonetta Rollene BRAVO, MD  Active   Phenylephrine -Witch Hazel (HEMORRHOIDAL COOLING) 0.25-50 % GEL [id number] Yes Applied twice a day as needed for hemorrhoids Ricky Fines, MD  Active   polyethylene glycol (MIRALAX  / GLYCOLAX ) 17 g packet [id number] Yes Take 17 g by mouth daily as needed for moderate constipation. [provider]  Active Pharmacy Records, Family Member  rosuvastatin  (CRESTOR ) 20 MG tablet [id number] Yes TAKE 1  TABLET BY MOUTH EVERY DAY *NEW PRESCRIPTION REQUEST* Antonetta Rollene BRAVO, MD  Active   sucralfate  (CARAFATE ) 1 g tablet [id number] Yes TAKE ONE (1) TABLET BY MOUTH TWICE DAILY *NEW PRESCRIPTION REQUESTDEWAINE Antonetta Rollene BRAVO, MD  Active   traZODone  (DESYREL ) 50 MG tablet [id number] Yes TAKE 1/2-1 TABLET BY MOUTH EVERY NIGHT AT BEDTIME AS NEEDED FOR SLEEP *NEW PRESCRIPTION REQUEST* Antonetta Rollene BRAVO, MD  Active   Med List Note (Ward, Angelica, CPhT 06/13/24 1927): Pt's niece helped with med rec            Goals Addressed             This Visit's Progress    [facility] Transitions of Care (TOC) Care Plan       Problems:  Recent Hospitalization for treatment of recent admission for sepsis, syncopy08/06/2024  Patient reports that she was dizzy yesterday when PT came and her BP was high. Reports she was not able to finish her treatment. 07/02/2024 No episodes this week,  07/09/2024 Denies any dizzy spells Medication  management barrier patient does not know all her medications, Family manages. and No Hospital Follow Up Provider appointment NO PCP hospital follow up yet scheduled.  06/25/2024  Patient reports that she is taking all her medications as prescribed.  Has follow up today with Dr. Antonetta. Son is taking patient to appointment. 07/02/2024   Reports that she has feeling well.  Reports saw PCP for follow up. Denies any changes in medications. 07/09/2024  Reports she saw PCP today and was ordered pain medications and xrays.  Right hip pain- 07/02/2024 reports arthritis pain is worsening. Pain 10/10 today. Using heating pads and tylenol  .Reports that she will discuss with MD at next office visit. 07/09/2024  Reports severe pain right hip/groin area.  States she had xray and will get her pain medications today.  Reports she is using a walker.    Goal:  Over the next 30 days, the patient will not experience hospital readmission  Interventions:  Transitions of Care: Doctor Visits  - discussed the  importance of doctor visits Reviewed importance of hydration Reviewed low salt diet Reviewed need for daily weight Reviewed medications- reports no changes in medications and taking as prescribed.  Reviewed PCP follow up scheduled 2 weeks Encouraged patient to use heat or ice which ever works best for her pain Encouraged patient to continue to take medications as prescribed.  Reviewed pending xray and new RX for pain.    Patient Self Care Activities:  Attend all scheduled provider appointments Call pharmacy for medication refills 3-7 days in advance of running out of medications Call provider office for new concerns or questions  Notify RN Care Manager of TOC call rescheduling needs Participate in Transition of Care Program/Attend TOC scheduled calls Take medications as prescribed   Weigh daily and record weights Follow low salt and DM diet Keep home health appointments.  Check blood pressure daily and write on paper to take to MD office Check weight daily and write it down.  Use heat or ice for hip pain Take new medication as prescribed.   Plan:  Telephone follow up appointment with care management team member scheduled for:  07/13/2024 The patient has been provided with contact information for the care management team and has been advised to call with any health related questions or concerns.          Recommendation:   Continue Current Plan of Care  Follow Up Plan:   Telephone follow up appointment date/time:  07/13/2024  Alan Ee, RN, BSN, CEN Population Health- Transition of Care Team.  [facility] [phone]

## 2024-07-09 NOTE — Patient Instructions (Addendum)
 F/U early to mid November  X ray of right hip  today , new pain medication sent , percocet, take no more than ONE tablet once daily as needed for pain( not 3 daily)  You are being referred for MRI of low back and also to  see Orthopedic Doc  New additional medication , wellbutrin  , for depression , continue fluoxetine  and buspar  as before  F/u appointment with Therapist  (Brian)needs to be scheduled, pls do so at c checkout if able  CAREFUL noyt to fall  Thanks for choosing [facility], we consider it a privelige to serve you.

## 2024-07-09 NOTE — Progress Notes (Signed)
   Sophia [name]     MRN: [id number]      DOB: 1951-04-11  Chief Complaint  Patient presents with   Medical Management of Chronic Issues    5 week follow up    Leg Pain    Complains of left leg pain in the groin area for a while    HPI Sophia [name] is here with right groin pain rated over 10 since 02/2024, getting worse, pain relief with sitting ,walking unbearable has used walker with a seat  at home for support , feels as though she will fall, right leg does feel slightly weak, but not numb, denies incontinece of stool or urine Denies recent fever or chills. Denies sinus pressure, nasal congestion, ear pain or sore throat. Denies chest congestion, productive cough or wheezing. Denies chest pains, palpitations and leg swelling Denies abdominal pain, nausea, vomiting,diarrhea or constipation.   Denies dysuria, frequency, hesitancy or incontinence.  Denies depression, anxiety or insomnia. Denies skin break down or rash.   PE  BP 124/70   Pulse 81   Resp 18   Ht 5' 9 (1.753 m)   Wt 187 lb 0.6 oz (84.8 kg)   SpO2 93%   BMI 27.62 kg/m   Patient alert and oriented and in no cardiopulmonary distress.  HEENT: No facial asymmetry, EOMI,     Neck supple .  Chest: Clear to auscultation bilaterally.  CVS: S1, S2 no murmurs, no S3.Regular rate.  ABD: Soft non tender.   Ext: No edema  MS: decreased  ROM spine, right  hip   Skin: Intact, no ulcerations or rash noted.  Psych: Good eye contact, normal affect. Memory intact anxious, tearful and depressed appearing.  CNS: CN 2-12 intact, power,  normal throughout.no focal deficits noted.   Assessment & Plan  Chronic right hip pain Increasingly uncontrolled pain, x ray of hip, refer Ortho, percocet one daily prescribed  Essential hypertension controlled DASH diet and commitment to daily physical activity for a minimum of 30 minutes discussed and encouraged, as a part of hypertension management. The importance of attaining a  healthy weight is also discussed.     07/09/2024   11:48 AM 07/09/2024   10:17 AM 07/09/2024    9:48 AM 06/25/2024    4:35 PM 06/25/2024    4:02 PM 06/25/2024    9:55 AM 06/17/2024    4:56 AM  BP/Weight  Systolic BP  124 848 128 133 114 140  Diastolic BP  70 69 76 71 62 70  Wt. (Lbs) --  187.04  190.04 183   BMI   27.62 kg/m2  28.06 kg/m2 27.02 kg/m2        Depression with anxiety Uncontrolled will reach out to Therapy  Hyperlipidemia with target LDL less than 100 Hyperlipidemia:Low fat diet discussed and encouraged.   Lipid Panel  Lab Results  Component Value Date   CHOL 117 05/27/2024   HDL 51 05/27/2024   LDLCALC 50 05/27/2024   TRIG 79 05/27/2024   CHOLHDL 2.3 05/27/2024     Controlled, no change in medication

## 2024-07-11 ENCOUNTER — Encounter: Payer: Self-pay | Admitting: Family Medicine

## 2024-07-11 DIAGNOSIS — G8929 Other chronic pain: Secondary | ICD-10-CM | POA: Insufficient documentation

## 2024-07-11 NOTE — Assessment & Plan Note (Addendum)
 controlled DASH diet and commitment to daily physical activity for a minimum of 30 minutes discussed and encouraged, as a part of hypertension management. The importance of attaining a healthy weight is also discussed.     07/09/2024   11:48 AM 07/09/2024   10:17 AM 07/09/2024    9:48 AM 06/25/2024    4:35 PM 06/25/2024    4:02 PM 06/25/2024    9:55 AM 06/17/2024    4:56 AM  BP/Weight  Systolic BP  124 848 128 133 114 140  Diastolic BP  70 69 76 71 62 70  Wt. (Lbs) --  187.04  190.04 183   BMI   27.62 kg/m2  28.06 kg/m2 27.02 kg/m2

## 2024-07-11 NOTE — Assessment & Plan Note (Signed)
 Uncontrolled will reach out to Therapy

## 2024-07-11 NOTE — Assessment & Plan Note (Signed)
 Hyperlipidemia:Low fat diet discussed and encouraged.   Lipid Panel  Lab Results  Component Value Date   CHOL 117 05/27/2024   HDL 51 05/27/2024   LDLCALC 50 05/27/2024   TRIG 79 05/27/2024   CHOLHDL 2.3 05/27/2024     Controlled, no change in medication

## 2024-07-11 NOTE — Assessment & Plan Note (Signed)
 Increasingly uncontrolled pain, x ray of hip, refer Ortho, percocet one daily prescribed

## 2024-07-13 ENCOUNTER — Ambulatory Visit (HOSPITAL_COMMUNITY)
Admission: RE | Admit: 2024-07-13 | Discharge: 2024-07-13 | Disposition: A | Discharge status: Home / Self Care | Source: Ambulatory Visit | Attending: Oncology | Admitting: Oncology

## 2024-07-13 ENCOUNTER — Inpatient Hospital Stay: Attending: Oncology

## 2024-07-13 DIAGNOSIS — C3491 Malignant neoplasm of unspecified part of right bronchus or lung: Secondary | ICD-10-CM | POA: Insufficient documentation

## 2024-07-13 DIAGNOSIS — C3431 Malignant neoplasm of lower lobe, right bronchus or lung: Secondary | ICD-10-CM | POA: Diagnosis not present

## 2024-07-13 DIAGNOSIS — I3139 Other pericardial effusion (noninflammatory): Secondary | ICD-10-CM | POA: Diagnosis not present

## 2024-07-13 DIAGNOSIS — N2889 Other specified disorders of kidney and ureter: Secondary | ICD-10-CM | POA: Diagnosis not present

## 2024-07-13 DIAGNOSIS — D1809 Hemangioma of other sites: Secondary | ICD-10-CM | POA: Diagnosis not present

## 2024-07-13 DIAGNOSIS — D649 Anemia, unspecified: Secondary | ICD-10-CM

## 2024-07-13 DIAGNOSIS — J90 Pleural effusion, not elsewhere classified: Secondary | ICD-10-CM | POA: Diagnosis not present

## 2024-07-13 LAB — CBC WITH DIFFERENTIAL/PLATELET
Abs Immature Granulocytes: 0.03 K/uL (ref 0.00–0.07)
Basophils Absolute: 0 K/uL (ref 0.0–0.1)
Basophils Relative: 0 %
Eosinophils Absolute: 0.2 K/uL (ref 0.0–0.5)
Eosinophils Relative: 3 %
HCT: 32.9 % — ABNORMAL LOW (ref 36.0–46.0)
Hemoglobin: 10.1 g/dL — ABNORMAL LOW (ref 12.0–15.0)
Immature Granulocytes: 0 %
Lymphocytes Relative: 30 %
Lymphs Abs: 2.1 K/uL (ref 0.7–4.0)
MCH: 25.4 pg — ABNORMAL LOW (ref 26.0–34.0)
MCHC: 30.7 g/dL (ref 30.0–36.0)
MCV: 82.9 fL (ref 80.0–100.0)
Monocytes Absolute: 0.6 K/uL (ref 0.1–1.0)
Monocytes Relative: 8 %
Neutro Abs: 4 K/uL (ref 1.7–7.7)
Neutrophils Relative %: 59 %
Platelets: 277 K/uL (ref 150–400)
RBC: 3.97 MIL/uL (ref 3.87–5.11)
RDW: 15.7 % — ABNORMAL HIGH (ref 11.5–15.5)
WBC: 6.8 K/uL (ref 4.0–10.5)
nRBC: 0 % (ref 0.0–0.2)

## 2024-07-13 LAB — COMPREHENSIVE METABOLIC PANEL WITH GFR
ALT: 14 U/L (ref 0–44)
AST: 15 U/L (ref 15–41)
Albumin: 3.7 g/dL (ref 3.5–5.0)
Alkaline Phosphatase: 51 U/L (ref 38–126)
Anion gap: 11 (ref 5–15)
BUN: 23 mg/dL (ref 8–23)
CO2: 26 mmol/L (ref 22–32)
Calcium: 9.3 mg/dL (ref 8.9–10.3)
Chloride: 104 mmol/L (ref 98–111)
Creatinine, Ser: 1.19 mg/dL — ABNORMAL HIGH (ref 0.44–1.00)
GFR, Estimated: 48 mL/min — ABNORMAL LOW (ref >60)
Glucose, Bld: 104 mg/dL — ABNORMAL HIGH (ref 70–99)
Potassium: 3.8 mmol/L (ref 3.5–5.1)
Sodium: 141 mmol/L (ref 135–145)
Total Bilirubin: 0.3 mg/dL (ref 0.0–1.2)
Total Protein: 7.2 g/dL (ref 6.5–8.1)

## 2024-07-13 MED ORDER — IOHEXOL 300 MG/ML  SOLN
80.0000 mL | Freq: Once | INTRAMUSCULAR | Status: AC | PRN
  Administered 2024-07-13: 80 mL via INTRAVENOUS

## 2024-07-14 DIAGNOSIS — A4151 Sepsis due to Escherichia coli [E. coli]: Secondary | ICD-10-CM | POA: Diagnosis not present

## 2024-07-14 DIAGNOSIS — N1831 Chronic kidney disease, stage 3a: Secondary | ICD-10-CM | POA: Diagnosis not present

## 2024-07-14 DIAGNOSIS — E1122 Type 2 diabetes mellitus with diabetic chronic kidney disease: Secondary | ICD-10-CM | POA: Diagnosis not present

## 2024-07-14 DIAGNOSIS — R6521 Severe sepsis with septic shock: Secondary | ICD-10-CM | POA: Diagnosis not present

## 2024-07-14 DIAGNOSIS — I5032 Chronic diastolic (congestive) heart failure: Secondary | ICD-10-CM | POA: Diagnosis not present

## 2024-07-14 DIAGNOSIS — N179 Acute kidney failure, unspecified: Secondary | ICD-10-CM | POA: Diagnosis not present

## 2024-07-14 DIAGNOSIS — C3491 Malignant neoplasm of unspecified part of right bronchus or lung: Secondary | ICD-10-CM | POA: Diagnosis not present

## 2024-07-14 DIAGNOSIS — I130 Hypertensive heart and chronic kidney disease with heart failure and stage 1 through stage 4 chronic kidney disease, or unspecified chronic kidney disease: Secondary | ICD-10-CM | POA: Diagnosis not present

## 2024-07-14 DIAGNOSIS — N390 Urinary tract infection, site not specified: Secondary | ICD-10-CM | POA: Diagnosis not present

## 2024-07-15 ENCOUNTER — Telehealth: Payer: Self-pay | Admitting: Internal Medicine

## 2024-07-15 ENCOUNTER — Other Ambulatory Visit: Payer: Self-pay

## 2024-07-15 NOTE — Telephone Encounter (Signed)
 Pt c/o medication issue:  1. Name of Medication:  dapagliflozin  propanediol (FARXIGA ) 10 MG TABS tablet  2. How are you currently taking this medication (dosage and times per day)?   3. Are you having a reaction (difficulty breathing--STAT)?   4. What is your medication issue?   Robin with [facility] is working on pill pack. Would like to know if patient should still be taking farxiga . Please advise.

## 2024-07-15 NOTE — Telephone Encounter (Signed)
 Spoke with Brooke at [facility] and advised that patient is no longer taking farxiga  and to add farxiga  and jardiance to her intolerance list.

## 2024-07-15 NOTE — Patient Instructions (Signed)
 Visit Information  Thank you for taking time to visit with me today.  Following is a copy of your care plan:   Goals Addressed             This Visit's Progress    COMPLETED: [facility] Transitions of Care (TOC) Care Plan       Problems:  Recent Hospitalization for treatment of recent admission for sepsis, syncopy08/06/2024  Patient reports that she was dizzy yesterday when PT came and her BP was high. Reports she was not able to finish her treatment. 07/02/2024 No episodes this week,  07/09/2024 Denies any dizzy spells Medication management barrier patient does not know all her medications, Family manages. and No Hospital Follow Up Provider appointment NO PCP hospital follow up yet scheduled.  06/25/2024  Patient reports that she is taking all her medications as prescribed.  Has follow up today with Dr. Antonetta. Son is taking patient to appointment. 07/02/2024   Reports that she has feeling well.  Reports saw PCP for follow up. Denies any changes in medications. 07/09/2024  Reports she saw PCP today and was ordered pain medications and xrays.  Right hip pain- 07/02/2024 reports arthritis pain is worsening. Pain 10/10 today. Using heating pads and tylenol  .Reports that she will discuss with MD at next office visit. 07/09/2024  Reports severe pain right hip/groin area.  States she had xray and will get her pain medications today.  Reports she is using a walker.    Goal:  Over the next 30 days, the patient will not experience hospital readmission  Interventions:  Transitions of Care: Doctor Visits  - discussed the importance of doctor visits Reviewed importance of hydration Reviewed low salt diet Reviewed need for daily weight Reviewed medications- Reviewed new medication for pain.  Encouraged patient to use heat or ice which ever works best for her pain Encouraged patient to continue to take medications as prescribed.  Reviewed weight and CBG level Assessed pain    Patient Self Care  Activities:  Attend all scheduled provider appointments Call pharmacy for medication refills 3-7 days in advance of running out of medications Call provider office for new concerns or questions  Take medications as prescribed   Weigh daily and record weights Follow low salt and DM diet Keep home health appointments- remains active with home health PT.  Check blood pressure daily and write on paper to take to MD office Check weight daily and write it down.  Use heat or ice for hip pain Take new medication as prescribed- reviewed fall prevention   Plan:  Patient has completed 30 day TOC program with no readmission.  Declines any additional needs. Reports  I am back to the normal Sophia [name]        Patient verbalizes understanding of instructions and care plan provided today and agrees to view in MyChart. Active MyChart status and patient understanding of how to access instructions and care plan via MyChart confirmed with patient.      Please call the care guide team at [phone] if you need to cancel or reschedule your appointment.   Please call the Suicide and Crisis Lifeline: 988 call the [place]  National Suicide Prevention Lifeline: 601-006-9719 or TTY: 703 262 5218 TTY 786-622-8313) to talk to a trained counselor call 1-800-273-TALK (toll free, 24 hour hotline) call 911 if you are experiencing a Mental Health or Behavioral Health Crisis or need someone to talk to.  Alan Ee, RN, BSN, CEN Population Health- Transition of Care Team.  [facility] [phone]

## 2024-07-15 NOTE — Transitions of Care (Post Inpatient/ED Visit) (Signed)
 Transition of Care week 5  Visit Note  07/15/2024  Name: Sophia [name] MRN: [id number]          DOB: 09/11/1951  Situation: Patient enrolled in [facility] 30-day program. Visit completed with patient by telephone.   Background:   Initial Transition Care Management Follow-up Telephone Call    Past Medical History:  Diagnosis Date   Acute respiratory disease due to COVID-19 virus 08/28/2019   Anxiety    Arthritis    Depression    Diabetes mellitus type 2 in obese 11/28/2010   Qualifier: Diagnosis of  By: Antonetta MD, Margaret  Diet controlled in 12/2016    Diabetes mellitus without complication (HCC)    GERD (gastroesophageal reflux disease)    Hypercholesteremia    Hypertension    PONV (postoperative nausea and vomiting)    Septic shock (HCC) 06/13/2024   Sleep apnea     Assessment: Patient reports she is doing great. No new concerns. Decreased pain. CBG normal. No syncopal episodes. Remains active with home health PT.  Patient Reported Symptoms: Cognitive Cognitive Status: Able to follow simple commands, Alert and oriented to person, place, and time, Normal speech and language skills      Neurological Neurological Review of Symptoms: No symptoms reported    HEENT HEENT Symptoms Reported: No symptoms reported      Cardiovascular Cardiovascular Symptoms Reported: No symptoms reported Does patient have uncontrolled Hypertension?: No Weight: 181 lb (82.1 kg) Cardiovascular Self-Management Outcome: 4 (good)  Respiratory Respiratory Symptoms Reported: No symptoms reported Respiratory Self-Management Outcome: 4 (good)  Endocrine Endocrine Symptoms Reported: No symptoms reported Is patient diabetic?: Yes Is patient checking blood sugars at home?: Yes List most recent blood sugar readings, include date and time of day: During call- CBG of 127 Endocrine Self-Management Outcome: 4 (good)  Gastrointestinal Gastrointestinal Symptoms Reported: No symptoms reported      Genitourinary  Genitourinary Symptoms Reported: No symptoms reported    Integumentary Integumentary Symptoms Reported: No symptoms reported    Musculoskeletal Musculoskelatal Symptoms Reviewed: Joint pain Other Musculoskeletal Symptoms: Pain much less than recently reports.  Reports she is feeling good. Musculoskeletal Management Strategies: Medication therapy Musculoskeletal Comment: new Rx for oxycodone       Psychosocial Psychosocial Symptoms Reported: No symptoms reported         Today's Vitals   07/15/24 1535 07/15/24 1536  Weight: 181 lb (82.1 kg)   PainSc:  1      Medications Reviewed Today     Reviewed by Rumalda Alan PENNER, RN (Registered Nurse) on 07/15/24 at 1527  Med List Status: <None>   Medication Order Taking? Sig Documenting Provider Last Dose Status Informant  acetaminophen  (TYLENOL ) 650 MG CR tablet [id number] Yes Take 650 mg by mouth 2 (two) times daily. [provider]  Active Pharmacy Records, Family Member  albuterol  (VENTOLIN  HFA) 108 [phone] Base) MCG/ACT inhaler [id number] Yes Inhale 2 puffs into the lungs every 6 (six) hours as needed for wheezing or shortness of breath. Antonetta Rollene BRAVO, MD  Active Pharmacy Records, Family Member  ASPIRIN  EC PO [id number] Yes Take 325 mg by mouth daily. Swallow whole. [provider]  Active Pharmacy Records, Family Member  buPROPion  (WELLBUTRIN  XL) 150 MG 24 hr tablet [id number] Yes Take 1 tablet (150 mg total) by mouth daily. Antonetta Rollene BRAVO, MD  Active   busPIRone  (BUSPAR ) 30 MG tablet [id number] Yes TAKE ONE (1) TABLET BY MOUTH TWICE DAILY *NEW PRESCRIPTION REQUEST* Antonetta Rollene BRAVO, MD  Active   chlorthalidone  (HYGROTON ) 25  MG tablet [id number] Yes Take 25 mg by mouth daily. [provider]  Active   Cyanocobalamin  (VITAMIN B-12 PO) [id number] Yes 1 tablet. Take 2 gummies daily. [provider]  Active Pharmacy Records, Family Member  ferrous gluconate  [facility]) 324 MG tablet [id number] Yes Take 324 mg by  mouth daily with breakfast. [provider]  Active Pharmacy Records, Family Member  FLUoxetine  (PROZAC ) 40 MG capsule [id number] Yes Take 1 capsule (40 mg total) by mouth daily. Antonetta Rollene BRAVO, MD  Active   gabapentin  (NEURONTIN ) 300 MG capsule [id number] Yes Take 1 capsule (300 mg total) by mouth 2 (two) times daily. Kerrin Elspeth BROCKS, MD  Active Pharmacy Records, Family Member           Med Note [phone], CHUCK KANDICE Repress Jun 13, 2024  7:21 PM) Caused excessive drowsiness   Melatonin 1 MG CHEW [id number] Yes Chew 2 mg by mouth at bedtime. Antonetta Rollene BRAVO, MD  Active Pharmacy Records, Family Member  Multiple Vitamin (MULTIVITAMIN) tablet [id number] Yes Take 1 tablet by mouth daily. [provider]  Active Pharmacy Records, Family Member  olmesartan -hydrochlorothiazide  (BENICAR  HCT) 20-12.5 MG tablet [id number] Yes TAKE ONE TABLET BY MOUTH EVERY DAY Wert, Michael B, MD  Active Pharmacy Records, Family Member  omeprazole  [facility]) 40 MG capsule [id number] Yes TAKE 1 CAPSULE TWICE DAILY FOR 2 MONTH ONLY Cindie Carlin POUR, DO  Active Pharmacy Records, Family Member           Med Note (WARD, CHUCK KANDICE Repress Jun 13, 2024  4:56 PM)    oxyCODONE -acetaminophen  (PERCOCET/ROXICET) 5-325 MG tablet [id number] Yes Take one tablet by mouth every 8 hours as needed, for rigth groin pain Antonetta Rollene BRAVO, MD  Active   Phenylephrine -Witch Hazel (HEMORRHOIDAL COOLING) 0.25-50 % GEL [id number] Yes Applied twice a day as needed for hemorrhoids Ricky Fines, MD  Active   polyethylene glycol (MIRALAX  / GLYCOLAX ) 17 g packet [id number] Yes Take 17 g by mouth daily as needed for moderate constipation. [provider]  Active Pharmacy Records, Family Member  rosuvastatin  (CRESTOR ) 20 MG tablet [id number] Yes TAKE 1 TABLET BY MOUTH EVERY DAY *NEW PRESCRIPTION REQUEST* Antonetta Rollene BRAVO, MD  Active   sucralfate  (CARAFATE ) 1 g tablet [id number] Yes TAKE ONE (1) TABLET BY MOUTH TWICE DAILY *NEW  PRESCRIPTION REQUESTDEWAINE Antonetta Rollene BRAVO, MD  Active   traZODone  (DESYREL ) 50 MG tablet [id number] Yes TAKE 1/2-1 TABLET BY MOUTH EVERY NIGHT AT BEDTIME AS NEEDED FOR SLEEP *NEW PRESCRIPTION REQUEST* Antonetta Rollene BRAVO, MD  Active   Med List Note (Ward, Angelica, CPhT 06/13/24 1927): Pt's niece helped with med rec             Goals Addressed             This Visit's Progress    COMPLETED: [facility] Transitions of Care (TOC) Care Plan       Problems:  Recent Hospitalization for treatment of recent admission for sepsis, syncopy08/06/2024  Patient reports that she was dizzy yesterday when PT came and her BP was high. Reports she was not able to finish her treatment. 07/02/2024 No episodes this week,  07/09/2024 Denies any dizzy spells Medication management barrier patient does not know all her medications, Family manages. and No Hospital Follow Up Provider appointment NO PCP hospital follow up yet scheduled.  06/25/2024  Patient reports that she is taking all her medications as prescribed.  Has follow up today with Dr. Antonetta. Son is taking patient  to appointment. 07/02/2024   Reports that she has feeling well.  Reports saw PCP for follow up. Denies any changes in medications. 07/09/2024  Reports she saw PCP today and was ordered pain medications and xrays.  Right hip pain- 07/02/2024 reports arthritis pain is worsening. Pain 10/10 today. Using heating pads and tylenol  .Reports that she will discuss with MD at next office visit. 07/09/2024  Reports severe pain right hip/groin area.  States she had xray and will get her pain medications today.  Reports she is using a walker.    Goal:  Over the next 30 days, the patient will not experience hospital readmission  Interventions:  Transitions of Care: Doctor Visits  - discussed the importance of doctor visits Reviewed importance of hydration Reviewed low salt diet Reviewed need for daily weight Reviewed medications- Reviewed new medication for  pain.  Encouraged patient to use heat or ice which ever works best for her pain Encouraged patient to continue to take medications as prescribed.  Reviewed weight and CBG level Assessed pain    Patient Self Care Activities:  Attend all scheduled provider appointments Call pharmacy for medication refills 3-7 days in advance of running out of medications Call provider office for new concerns or questions  Take medications as prescribed   Weigh daily and record weights Follow low salt and DM diet Keep home health appointments- remains active with home health PT.  Check blood pressure daily and write on paper to take to MD office Check weight daily and write it down.  Use heat or ice for hip pain Take new medication as prescribed- reviewed fall prevention   Plan:  Patient has completed 30 day TOC program with no readmission.  Declines any additional needs. Reports  I am back to the normal [place]         Recommendation:   Continue Current Plan of Care  Follow Up Plan:   Closing From:  Transitions of Care Program  Alan Ee, RN, BSN, [employer]- Transition of Care Team.  [facility] [phone]

## 2024-07-16 ENCOUNTER — Ambulatory Visit: Payer: Self-pay | Admitting: Professional Counselor

## 2024-07-20 DIAGNOSIS — E1122 Type 2 diabetes mellitus with diabetic chronic kidney disease: Secondary | ICD-10-CM | POA: Diagnosis not present

## 2024-07-20 DIAGNOSIS — I130 Hypertensive heart and chronic kidney disease with heart failure and stage 1 through stage 4 chronic kidney disease, or unspecified chronic kidney disease: Secondary | ICD-10-CM | POA: Diagnosis not present

## 2024-07-20 DIAGNOSIS — A4151 Sepsis due to Escherichia coli [E. coli]: Secondary | ICD-10-CM | POA: Diagnosis not present

## 2024-07-20 DIAGNOSIS — N179 Acute kidney failure, unspecified: Secondary | ICD-10-CM | POA: Diagnosis not present

## 2024-07-20 DIAGNOSIS — R6521 Severe sepsis with septic shock: Secondary | ICD-10-CM | POA: Diagnosis not present

## 2024-07-20 DIAGNOSIS — N1831 Chronic kidney disease, stage 3a: Secondary | ICD-10-CM | POA: Diagnosis not present

## 2024-07-20 DIAGNOSIS — C3491 Malignant neoplasm of unspecified part of right bronchus or lung: Secondary | ICD-10-CM | POA: Diagnosis not present

## 2024-07-20 DIAGNOSIS — N390 Urinary tract infection, site not specified: Secondary | ICD-10-CM | POA: Diagnosis not present

## 2024-07-20 DIAGNOSIS — I5032 Chronic diastolic (congestive) heart failure: Secondary | ICD-10-CM | POA: Diagnosis not present

## 2024-07-22 ENCOUNTER — Ambulatory Visit: Admitting: Orthopaedic Surgery

## 2024-07-25 NOTE — Assessment & Plan Note (Addendum)
 Patient has stage Ib non-small cell lung cancer s/p right lobectomy(stage Ib due to residual pleural invasion) Does not meet criteria for chemotherapy or targeted therapy(all studies with targeted therapy are more applicable for tumor size greater than 4 cm) No indication for further radiation therapy at this time  -Will do surveillance per NCCN guideline - H&P and chest CT with or without contrast every 6 months for 2 to 3 years, then H&P and and low-dose noncontrast enhanced chest CT annually - Next CT scan due in August  Return to clinic after the CT scan to discuss results and further management

## 2024-07-25 NOTE — Assessment & Plan Note (Addendum)
 Patient has normocytic anemia since 04/06/2024.  Likely nutritional deficiency/blood loss Last endoscopy 03/07/2021: Showed gastritis Last colonoscopy: 12/07/2020: No active bleeding Multiple myeloma workup: SPEP: No M spike, IFE: Normal Nutritional workup: Normal  - Labs consistent with slight improvement in hemoglobin.  Likely secondary to surgical blood loss. -Will repeat labs in 2 months.  Continue to monitor for now.

## 2024-07-25 NOTE — Assessment & Plan Note (Deleted)
 Continued snuff tobacco use, desires cessation.  Previous nicotine-free snuff attempts failed.   - Prescribe low-dose nicotine patch. - Send prescription to [facility].

## 2024-07-25 NOTE — Progress Notes (Signed)
 Patient Care Team: Sophia Rollene BRAVO, MD as PCP - General (Family Medicine) Sophia Diannah SQUIBB, MD as PCP - Cardiology (Cardiology) Sophia Anes, MD as Consulting Physician (Ophthalmology) Sophia Taft BRAVO, MD as Consulting Physician (Orthopedic Surgery) Sophia Carlin POUR, DO as Consulting Physician (Internal Medicine)  Clinic Day:  07/26/2024  Referring physician: Antonetta Rollene BRAVO, MD   CHIEF COMPLAINT:  CC: Stage Ib non-small cell lung cancer s/p lobectomy  ASSESSMENT & PLAN:   Assessment & Plan: Sophia [name]  is a 73 y.o. female with stage Ib non-small cell lung cancer Assessment & Plan Non-small cell carcinoma of lung, right (HCC) Patient has stage Ib non-small cell lung cancer s/p right lobectomy(stage Ib due to residual pleural invasion) Does not meet criteria for chemotherapy or targeted therapy(all studies with targeted therapy are more applicable for tumor size greater than 4 cm) No indication for further radiation therapy at this time  -We reviewed the recent CT scan findings together.  No evidence of recurrence of cancer.  Patient does have some pericardial effusion but is asymptomatic. -Will do surveillance per NCCN guideline - H&P and chest CT with or without contrast every 6 months for 2 to 3 years, then H&P and and low-dose noncontrast enhanced chest CT annually - Next CT scan due in December 2025  Return to clinic after the CT scan to discuss results and further management Normocytic anemia Patient has normocytic anemia since 04/06/2024.  Likely nutritional deficiency/blood loss Last endoscopy 03/07/2021: Showed gastritis Last colonoscopy: 12/07/2020: No active bleeding Multiple myeloma workup: SPEP: No M spike, IFE: Normal Nutritional workup: Normal with slight iron  deficiency  - Continue to monitor for now - Continue taking iron  pills every other day.  Use MiraLAX  for constipation. -Will consider further workup with bone marrow biopsy if she does  not recover by next visit  Repeat labs in 4 months AKI (acute kidney injury) (HCC) Patient with slightly worsened kidney function that is new.  Likely secondary to hypertension.  Adequate hydration reported, no diabetes.  - Monitor kidney function with blood work in three months. - Consider referral to nephrologist if kidney function worsens. - Recommended holding diuretics if BP is better controlled  Pericardial effusion Presence of pericardial effusion noted on scan, asymptomatic.  - Will monitor for now     The patient understands the plans discussed today and is in agreement with them.  She knows to contact our office if she develops concerns prior to her next appointment.  I provided 40 minutes of face-to-face time during this encounter and > 50% was spent counseling as documented under my assessment and plan.    Sophia [name],acting as a Neurosurgeon for Mickiel Dry, MD.,have documented all relevant documentation on the behalf of Mickiel Dry, MD,as directed by  Mickiel Dry, MD while in the presence of Mickiel Dry, MD.  I, Mickiel Dry MD, have reviewed the above documentation for accuracy and completeness, and I agree with the above.     Mickiel Dry, MD  [facility] [place] - A DEPT OF [name] [address] [place] [address] Dept: [phone] Dept Fax: [phone]   Orders Placed This Encounter  Procedures   CBC with Differential    Standing Status:   Future    Expected Date:   10/25/2024    Expiration Date:   01/23/2025   Comprehensive metabolic panel    Standing Status:   Future    Expected Date:  10/25/2024    Expiration Date:   01/23/2025   Iron  and TIBC ([facility] ONLY)    Standing Status:   Future    Expected Date:   10/25/2024    Expiration Date:   01/23/2025   Ferritin    Standing Status:   Future    Expected Date:   10/25/2024    Expiration Date:    01/23/2025   Vitamin B12    Standing Status:   Future    Expected Date:   10/25/2024    Expiration Date:   01/23/2025   Folate    Standing Status:   Future    Expected Date:   10/25/2024    Expiration Date:   01/23/2025     ONCOLOGY HISTORY:   Diagnosis: Stage Ib non-small cell lung cancer-adenocarcinoma  -12/13/2023: CT chest without contrast:10 x 8 mm subpleural right posterior lower lobe pulmonary nodule.  - 01/09/2024: PET scan:10 mm right lower lobe nodule does not show metabolism above blood pool but has enlarged from 08/11/2022. Low-grade adenocarcinoma cannot be definitively excluded. Small pericardial effusion, minimally improved. Left adrenal adenoma. - 03/31/2024: Right lower lobe wedge resection:  -Adenocarcinoma, acinar, 1.1 cm. Carcinoma involves visceral pleural connective tissue (pT2a). Surgical margins negative for carcinoma. Negative for lymphovascular involvement. Lymphovascular Invasion: Not identified Regional Lymph Nodes:Nodal Sites Examined: 4R, 7, 8, 10, 11: Negative -Pathologic Stage: pT2a, pN0  - 07/13/2024: CT CAP: Surgical changes of right lower lobe wedge resection. Small right pleural effusion and adjacent consolidation obscures the suture line limiting evaluation for residual/new nodularity. Stable scattered tiny pulmonary nodules. No new suspicious pulmonary nodules or masses. No evidence of metastatic disease within the abdomen or pelvis.   Current Treatment:  Surveillance  INTERVAL HISTORY:  Sophia [name] is here today for follow up.   She has no increased shortness of breath or difficulty breathing.  She denies abdominal pain, nausea, vomiting.  She has no other complaints today.  She quit smoking cigarettes years ago and denies recent use. She mentions using marijuana occasionally, especially after the recent loss of her husband, which helps her cope with stress.  I have reviewed the past medical history, past surgical history, social history and family  history with the patient and they are unchanged from previous note.  ALLERGIES:  is allergic to farxiga  [dapagliflozin ] and jardiance [empagliflozin].  MEDICATIONS:  Current Outpatient Medications  Medication Sig Dispense Refill   acetaminophen  (TYLENOL ) 650 MG CR tablet Take 650 mg by mouth 2 (two) times daily.     albuterol  (VENTOLIN  HFA) 108 (90 Base) MCG/ACT inhaler Inhale 2 puffs into the lungs every 6 (six) hours as needed for wheezing or shortness of breath. 8 g 0   amLODipine  (NORVASC ) 10 MG tablet Take 10 mg by mouth daily.     ASPIRIN  EC PO Take 325 mg by mouth daily. Swallow whole.     buPROPion  (WELLBUTRIN  XL) 150 MG 24 hr tablet Take 1 tablet (150 mg total) by mouth daily. 30 tablet 3   busPIRone  (BUSPAR ) 30 MG tablet TAKE ONE (1) TABLET BY MOUTH TWICE DAILY *NEW PRESCRIPTION REQUEST* 180 tablet 11   chlorthalidone  (HYGROTON ) 25 MG tablet Take 25 mg by mouth daily.     Cyanocobalamin  (VITAMIN B-12 PO) 1 tablet. Take 2 gummies daily.     ferrous gluconate  (FERGON) 324 MG tablet Take 324 mg by mouth daily with breakfast.     FLUoxetine  (PROZAC ) 40 MG capsule Take 1 capsule (40 mg total) by mouth daily. 90 capsule  11   gabapentin  (NEURONTIN ) 300 MG capsule Take 1 capsule (300 mg total) by mouth 2 (two) times daily. 60 capsule 3   Melatonin 1 MG CHEW Chew 2 mg by mouth at bedtime.     Multiple Vitamin (MULTIVITAMIN) tablet Take 1 tablet by mouth daily.     olmesartan -hydrochlorothiazide  (BENICAR  HCT) 20-12.5 MG tablet TAKE ONE TABLET BY MOUTH EVERY DAY 30 tablet 11   omeprazole  (PRILOSEC) 40 MG capsule TAKE 1 CAPSULE TWICE DAILY FOR 2 MONTH ONLY 180 capsule 3   oxyCODONE -acetaminophen  (PERCOCET/ROXICET) 5-325 MG tablet Take one tablet by mouth every 8 hours as needed, for rigth groin pain 15 tablet 0   Phenylephrine -Witch Hazel (HEMORRHOIDAL COOLING) 0.25-50 % GEL Applied twice a day as needed for hemorrhoids 51 g 0   polyethylene glycol (MIRALAX  / GLYCOLAX ) 17 g packet Take 17 g by  mouth daily as needed for moderate constipation.     rosuvastatin  (CRESTOR ) 20 MG tablet TAKE 1 TABLET BY MOUTH EVERY DAY *NEW PRESCRIPTION REQUEST* 90 tablet 11   sucralfate  (CARAFATE ) 1 g tablet TAKE ONE (1) TABLET BY MOUTH TWICE DAILY *NEW PRESCRIPTION REQUEST* 180 tablet 11   traZODone  (DESYREL ) 50 MG tablet TAKE 1/2-1 TABLET BY MOUTH EVERY NIGHT AT BEDTIME AS NEEDED FOR SLEEP *NEW PRESCRIPTION REQUEST* 90 tablet 11   No current facility-administered medications for this visit.    REVIEW OF SYSTEMS:   Constitutional: Denies fevers, chills or abnormal weight loss Eyes: Denies blurriness of vision Ears, nose, mouth, throat, and face: Denies mucositis or sore throat Respiratory: Denies cough, dyspnea or wheezes Cardiovascular: Denies palpitation, chest discomfort or lower extremity swelling Gastrointestinal:  Denies nausea, heartburn or change in bowel habits Skin: Denies abnormal skin rashes Lymphatics: Denies new lymphadenopathy or easy bruising Neurological:Denies numbness, tingling or new weaknesses Behavioral/Psych: Mood is stable, no new changes  All other systems were reviewed with the patient and are negative.   VITALS:  Blood pressure (!) 152/75, pulse (!) 57, temperature 97.8 F (36.6 C), temperature source Oral, resp. rate 16, weight 184 lb 9.6 oz (83.7 kg), SpO2 100%.  Wt Readings from Last 3 Encounters:  07/26/24 184 lb 9.6 oz (83.7 kg)  07/15/24 181 lb (82.1 kg)  07/09/24 187 lb 0.6 oz (84.8 kg)    Body mass index is 27.26 kg/m.  Performance status (ECOG): 2 - Symptomatic, <50% confined to bed  PHYSICAL EXAM:   GENERAL:alert, no distress and comfortable LUNGS: clear to auscultation and percussion with normal breathing effort HEART: regular rate & rhythm and no murmurs and no lower extremity edema ABDOMEN:abdomen soft, non-tender and normal bowel sounds Musculoskeletal:no cyanosis of digits and no clubbing  NEURO: alert & oriented x 3 with fluent  speech  LABORATORY DATA:  I have reviewed the data as listed    Component Value Date/Time   NA 141 07/13/2024 1235   NA 142 06/25/2024 1649   K 3.8 07/13/2024 1235   CL 104 07/13/2024 1235   CO2 26 07/13/2024 1235   GLUCOSE 104 (H) 07/13/2024 1235   BUN 23 07/13/2024 1235   BUN 26 06/25/2024 1649   CREATININE 1.19 (H) 07/13/2024 1235   CREATININE 0.97 (H) 09/01/2020 0941   CALCIUM  9.3 07/13/2024 1235   PROT 7.2 07/13/2024 1235   PROT 7.1 06/25/2024 1649   ALBUMIN 3.7 07/13/2024 1235   ALBUMIN 4.3 06/25/2024 1649   AST 15 07/13/2024 1235   ALT 14 07/13/2024 1235   ALKPHOS 51 07/13/2024 1235   BILITOT 0.3 07/13/2024 1235  BILITOT 0.2 06/25/2024 1649   GFRNONAA 48 (L) 07/13/2024 1235   GFRNONAA 59 (L) 09/01/2020 0941   GFRAA 79 11/30/2020 1111   GFRAA 69 09/01/2020 0941    Lab Results  Component Value Date   WBC 6.8 07/13/2024   NEUTROABS 4.0 07/13/2024   HGB 10.1 (L) 07/13/2024   HCT 32.9 (L) 07/13/2024   MCV 82.9 07/13/2024   PLT 277 07/13/2024      Chemistry      Component Value Date/Time   NA 141 07/13/2024 1235   NA 142 06/25/2024 1649   K 3.8 07/13/2024 1235   CL 104 07/13/2024 1235   CO2 26 07/13/2024 1235   BUN 23 07/13/2024 1235   BUN 26 06/25/2024 1649   CREATININE 1.19 (H) 07/13/2024 1235   CREATININE 0.97 (H) 09/01/2020 0941      Component Value Date/Time   CALCIUM  9.3 07/13/2024 1235   ALKPHOS 51 07/13/2024 1235   AST 15 07/13/2024 1235   ALT 14 07/13/2024 1235   BILITOT 0.3 07/13/2024 1235   BILITOT 0.2 06/25/2024 1649      Latest Reference Range & Units 05/03/24 11:43  Iron  28 - 170 ug/dL 58  UIBC ug/dL 727  TIBC 749 - 549 ug/dL 669  Saturation Ratios 10.4 - 31.8 % 18  Ferritin 11 - 307 ng/mL 171  Folate >5.9 ng/mL 31.1  Vitamin B12 180 - 914 pg/mL 1,454 (H)  (H): Data is abnormally high   Latest Reference Range & Units 05/03/24 11:42  Total Protein ELP 6.0 - 8.5 g/dL 6.9 (C)  Albumin SerPl Elph-Mcnc 2.9 - 4.4 g/dL 3.7 (C)   Albumin/Glob SerPl 0.7 - 1.7  1.2 (C)  Alpha2 Glob SerPl Elph-Mcnc 0.4 - 1.0 g/dL 0.8 (C)  Alpha 1 0.0 - 0.4 g/dL 0.2 (C)  Gamma Glob SerPl Elph-Mcnc 0.4 - 1.8 g/dL 1.1 (C)  M Protein SerPl Elph-Mcnc Not Observed g/dL Not Observed (C)  IFE 1  Comment (C)  Globulin, Total 2.2 - 3.9 g/dL 3.2 (C)  B-Globulin SerPl Elph-Mcnc 0.7 - 1.3 g/dL 1.1 (C)  IgG (Immunoglobin G), Serum 586 - 1,602 mg/dL 8,927  IgM (Immunoglobulin M), Srm 26 - 217 mg/dL 59  IgA 64 - 577 mg/dL 775  (C): Corrected   Latest Reference Range & Units 05/03/24 11:42  Kappa free light chain 3.3 - 19.4 mg/L 24.3 (H)  Lambda free light chains 5.7 - 26.3 mg/L 21.8  Kappa, lambda light chain ratio 0.26 - 1.65  1.11  (H): Data is abnormally high  Latest Reference Range & Units 05/03/24 11:42  Copper  80 - 158 ug/dL 92    RADIOGRAPHIC STUDIES: I have personally reviewed the radiological images as listed and agreed with the findings in the report.  CT CHEST ABDOMEN PELVIS W CONTRAST Result Date: 07/23/2024 CLINICAL DATA:  History of non-small cell lung cancer, follow-up/restaging. * Tracking Code: BO * EXAM: CT CHEST, ABDOMEN, AND PELVIS WITH CONTRAST TECHNIQUE: Multidetector CT imaging of the chest, abdomen and pelvis was performed following the standard protocol during bolus administration of intravenous contrast. RADIATION DOSE REDUCTION: This exam was performed according to the departmental dose-optimization program which includes automated exposure control, adjustment of the mA and/or kV according to patient size and/or use of iterative reconstruction technique. CONTRAST:  80mL OMNIPAQUE  IOHEXOL  300 MG/ML  SOLN COMPARISON:  Multiple priors including CT June 13, 2024 and PET-CT January 09, 2024 FINDINGS: CT CHEST FINDINGS Cardiovascular: Aortic atherosclerosis. Normal caliber abdominal aorta. Coronary artery calcifications. Small pericardial effusion, slightly  increased from prior. Normal size heart. Mediastinum/Nodes: No  suspicious thyroid  nodule. No pathologically enlarged mediastinal, hilar or axillary lymph nodes. The esophagus is grossly unremarkable. Lungs/Pleura: Surgical changes of right lower lobe wedge resection. Small right pleural effusion and adjacent consolidation obscures the suture line limiting evaluation for residual/new nodularity. Stable scattered tiny pulmonary nodules for instance a 3 mm pulmonary nodule on image 39/3, a 2-3 mm pulmonary nodule in the left upper lobe on image 41/3 and a 2-3 mm nodule in the right upper lobe on image 55/3. No new suspicious pulmonary nodules or masses. Musculoskeletal: No aggressive lytic or blastic lesion of bone. T6 vertebral body hemangioma. Mild anterior wedging of the T6 vertebral body unchanged. Multilevel degenerative change of the spine with bridging anterior vertebral osteophytes. CT ABDOMEN PELVIS FINDINGS Hepatobiliary: No suspicious hepatic lesion. Gallbladder is unremarkable. Stable extra and central intrahepatic biliary ductal dilation with the common duct measuring 15 mm in short axis on image 69/4, gentle tapering of the duct to the level of the ampulla present over multiple prior examinations and likely within physiologic normal limits for this patient. Pancreas: Pancreatic divisum, anatomic variant. No pancreatic ductal dilation or evidence of acute inflammation. Spleen: No splenomegaly or focal splenic lesion. Adrenals/Urinary Tract: Stable left adrenal nodule previously characterized as an adenoma on PET-CT January 09, 2024. Right adrenal gland appears normal. No hydronephrosis. Too small to accurately characterize bilateral renal lesions. Kidneys demonstrate symmetric enhancement. Urinary bladder is unremarkable for degree of distension. Stomach/Bowel: Stomach is nondistended limiting evaluation. No pathologic dilation of small or large bowel. No evidence of acute bowel inflammation. Vascular/Lymphatic: Aortic and branch vessel atherosclerosis. The portal,  splenic and superior mesenteric veins are patent. No pathologically enlarged abdominal or pelvic lymph nodes. Reproductive: Status post hysterectomy. No adnexal masses. Other: No significant abdominopelvic free fluid. Musculoskeletal: No aggressive lytic or blastic lesion of bone. Thoracolumbar spondylosis. Severe degenerative change of the right hip. IMPRESSION: 1. Surgical changes of right lower lobe wedge resection. Small right pleural effusion and adjacent consolidation obscures the suture line limiting evaluation for residual/new nodularity. 2. Stable scattered tiny pulmonary nodules. No new suspicious pulmonary nodules or masses. 3. No evidence of metastatic disease within the abdomen or pelvis. 4. Small pericardial effusion, slightly increased from prior. 5. Stable extra and central intrahepatic biliary ductal dilation with the common duct measuring 15 mm in short axis, gentle tapering of the duct to the level of the ampulla present over multiple prior examinations and likely within physiologic normal limits for this patient. Suggest correlation with laboratory values (total bilirubin) for biliary obstruction. Electronically Signed   By: Reyes Holder M.D.   On: 07/23/2024 17:50

## 2024-07-26 ENCOUNTER — Inpatient Hospital Stay: Attending: Oncology | Admitting: Oncology

## 2024-07-26 VITALS — BP 152/75 | HR 57 | Temp 97.8°F | Resp 16 | Wt 184.6 lb

## 2024-07-26 DIAGNOSIS — Z7982 Long term (current) use of aspirin: Secondary | ICD-10-CM | POA: Insufficient documentation

## 2024-07-26 DIAGNOSIS — C3491 Malignant neoplasm of unspecified part of right bronchus or lung: Secondary | ICD-10-CM

## 2024-07-26 DIAGNOSIS — Z902 Acquired absence of lung [part of]: Secondary | ICD-10-CM | POA: Insufficient documentation

## 2024-07-26 DIAGNOSIS — I3139 Other pericardial effusion (noninflammatory): Secondary | ICD-10-CM | POA: Diagnosis not present

## 2024-07-26 DIAGNOSIS — D649 Anemia, unspecified: Secondary | ICD-10-CM | POA: Insufficient documentation

## 2024-07-26 DIAGNOSIS — N179 Acute kidney failure, unspecified: Secondary | ICD-10-CM | POA: Diagnosis not present

## 2024-07-26 DIAGNOSIS — Z87891 Personal history of nicotine dependence: Secondary | ICD-10-CM | POA: Insufficient documentation

## 2024-07-26 DIAGNOSIS — Z79899 Other long term (current) drug therapy: Secondary | ICD-10-CM | POA: Insufficient documentation

## 2024-07-26 DIAGNOSIS — F17200 Nicotine dependence, unspecified, uncomplicated: Secondary | ICD-10-CM

## 2024-07-26 DIAGNOSIS — C3431 Malignant neoplasm of lower lobe, right bronchus or lung: Secondary | ICD-10-CM | POA: Diagnosis not present

## 2024-07-26 NOTE — Assessment & Plan Note (Signed)
 Presence of pericardial effusion noted on scan, asymptomatic.  - Will monitor for now

## 2024-07-26 NOTE — Assessment & Plan Note (Addendum)
 Patient with slightly worsened kidney function that is new.  Likely secondary to hypertension.  Adequate hydration reported, no diabetes.  - Monitor kidney function with blood work in three months. - Consider referral to nephrologist if kidney function worsens. - Recommended holding diuretics if BP is better controlled

## 2024-07-26 NOTE — Patient Instructions (Addendum)
 [facility] at [facility] Discharge Instructions   You were seen and examined today by Dr. Davonna.  She reviewed the results of your lab work which are mostly normal/stable. Your hemoglobin is a little low. We will repeat lab work in 3 months to keep an eye on this.   We will see you back in 3 months.   Return as scheduled.    Thank you for choosing [facility] at [facility] to provide your oncology and hematology care.  To afford each patient quality time with our provider, please arrive at least 15 minutes before your scheduled appointment time.   If you have a lab appointment with the Cancer Center please come in thru the Main Entrance and check in at the main information desk.  You need to re-schedule your appointment should you arrive 10 or more minutes late.  We strive to give you quality time with our providers, and arriving late affects you and other patients whose appointments are after yours.  Also, if you no show three or more times for appointments you may be dismissed from the clinic at the providers discretion.     Again, thank you for choosing [facility].  Our hope is that these requests will decrease the amount of time that you wait before being seen by our physicians.       _____________________________________________________________  Should you have questions after your visit to [facility], please contact our office at [phone] and follow the prompts.  Our office hours are 8:00 a.m. and 4:30 p.m. Monday - Friday.  Please note that voicemails left after 4:00 p.m. may not be returned until the following business day.  We are closed weekends and major holidays.  You do have access to a nurse 24-7, just call the main number to the clinic [phone] and do not press any options, hold on the line and a nurse will answer the phone.    For prescription refill requests, have your pharmacy contact our  office and allow 72 hours.    Due to Covid, you will need to wear a mask upon entering the hospital. If you do not have a mask, a mask will be given to you at the Main Entrance upon arrival. For doctor visits, patients may have 1 support person age 29 or older with them. For treatment visits, patients can not have anyone with them due to social distancing guidelines and our immunocompromised population.

## 2024-07-29 ENCOUNTER — Ambulatory Visit: Admitting: Orthopaedic Surgery

## 2024-07-29 ENCOUNTER — Encounter: Payer: Self-pay | Admitting: Orthopaedic Surgery

## 2024-07-29 VITALS — BP 129/78 | HR 67 | Ht 69.0 in | Wt 184.0 lb

## 2024-07-29 DIAGNOSIS — M1611 Unilateral primary osteoarthritis, right hip: Secondary | ICD-10-CM | POA: Diagnosis not present

## 2024-07-29 NOTE — Progress Notes (Signed)
 Subjective:    Patient ID: Sophia [name], female    DOB: 1951/07/11, 73 y.o.   MRN: [id number]  HPI She has had hip pain on the right for several months getting worse.  She uses a cane now. She has deep pain and cannot walk distances.  She has pain at rest now.  She had X-rays done 07-09-24 showing: IMPRESSION: Advanced osteoarthritis of the right hip.  She had lung surgery this year earlier and she lost her husband also.    She is not helped with Advil , tylenol , rest, heat, ice.  She is tired of hurting. She denies any trauma or numbness.   Review of Systems  Constitutional:  Positive for activity change.  Musculoskeletal:  Positive for arthralgias, gait problem and myalgias.  For Review of Systems, all other systems reviewed and are negative.  The following is a summary of the past history medically, past history surgically, known current medicines, social history and family history.  This information is gathered electronically by the computer from prior information and documentation.  I review this each visit and have found including this information at this point in the chart is beneficial and informative.   Past Medical History:  Diagnosis Date   Acute respiratory disease due to COVID-19 virus 08/28/2019   Anxiety    Arthritis    Depression    Diabetes mellitus type 2 in obese 11/28/2010   Qualifier: Diagnosis of  By: Antonetta MD, Margaret  Diet controlled in 12/2016    Diabetes mellitus without complication (HCC)    GERD (gastroesophageal reflux disease)    Hypercholesteremia    Hypertension    PONV (postoperative nausea and vomiting)    Septic shock (HCC) 06/13/2024   Sleep apnea     Past Surgical History:  Procedure Laterality Date   ABDOMINAL HYSTERECTOMY  11/18/1980   tubes and womb, bleeding and ectopic   ABDOMINAL SURGERY     removal of tumors   BALLOON DILATION N/A 02/16/2021   Procedure: BALLOON DILATION;  Surgeon: Cindie Carlin POUR, DO;  [facility];  Service: Endoscopy;  Laterality: N/A;   BIOPSY  02/16/2021   Procedure: BIOPSY;  Surgeon: Cindie Carlin POUR, DO;  [facility];  Service: Endoscopy;;   BREAST SURGERY Left 11/18/1981   benign tumor   CATARACT EXTRACTION W/PHACO Right 01/11/2014   Procedure: CATARACT EXTRACTION PHACO AND INTRAOCULAR LENS PLACEMENT (IOC);  Surgeon: Oneil T. Roz, MD;  Location: [facility];  Service: Ophthalmology;  Laterality: Right;  CDE 5.57   CATARACT EXTRACTION W/PHACO Left 01/25/2014   Procedure: CATARACT EXTRACTION PHACO AND INTRAOCULAR LENS PLACEMENT (IOC);  Surgeon: Oneil T. Roz, MD;  Location: [facility];  Service: Ophthalmology;  Laterality: Left;  CDE:5.19   COLONOSCOPY  2007 [facility]   NL EXAM   COLONOSCOPY N/A 04/22/2014   Dr. Fields:Normal mucosa in the terminal ileum/Two COLON polyps REMOVED/ Mild diverticulosis in the ascending colon and transverse colon/The LEFT colon IS redundant/Small internal hemorrhoids. Path: tubular adenoma. Next colonoscopy in 5-10 years   COLONOSCOPY WITH PROPOFOL  N/A 11/27/2020   Surgeon: Cindie Carlin POUR, DO;  Nonbleeding internal hemorrhoids, one 6 mm transverse colon polyp resected, diverticulosis in the sigmoid colon.  Pathology with tubular adenoma.  Repeat in 5 years.   ESOPHAGOGASTRODUODENOSCOPY N/A 04/22/2014   Dr. Fields:MICROCYTIC ANEMIA MOST LIKELY DUE TO ASA/VOLTAREN /Small hiatal hernia/MODERATE Non-erosive gastritis. Negative H.pylori   ESOPHAGOGASTRODUODENOSCOPY (EGD) WITH PROPOFOL  N/A 02/16/2021   Surgeon: Cindie Carlin POUR, DO; benign-appearing esophageal stenosis s/p dilation,  gastritis biopsied.  Biopsies were benign.   INTERCOSTAL NERVE BLOCK Right 03/31/2024   Procedure: BLOCK, NERVE, INTERCOSTAL;  Surgeon: Kerrin Elspeth BROCKS, MD;  Location: [facility];  Service: Thoracic;  Laterality: Right;   LEFT HEART CATH AND CORONARY ANGIOGRAPHY N/A 06/23/2018   Procedure: LEFT HEART CATH AND CORONARY ANGIOGRAPHY;  Surgeon: Dann Candyce RAMAN, MD;   Location: [facility] INVASIVE CV LAB;  Service: Cardiovascular;  Laterality: N/A;   LYMPH NODE BIOPSY Right 03/31/2024   Procedure: LYMPH NODE BIOPSY;  Surgeon: Kerrin Elspeth BROCKS, MD;  Location: [facility] OR;  Service: Thoracic;  Laterality: Right;   POLYPECTOMY  11/27/2020   Procedure: POLYPECTOMY;  Surgeon: Cindie Carlin POUR, DO;  [facility];  Service: Endoscopy;;   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   ULTRASOUND GUIDANCE FOR VASCULAR ACCESS  06/23/2018   Procedure: Ultrasound Guidance For Vascular Access;  Surgeon: Dann Candyce RAMAN, MD;  Location: [facility] INVASIVE CV LAB;  Service: Cardiovascular;;   UPPER GASTROINTESTINAL ENDOSCOPY  2007 CHEST PAIN   NL EXAM   WEDGE RESECTION, LUNG, ROBOT-ASSISTED, THORACOSCOPIC  03/31/2024   Procedure: WEDGE RESECTION, LUNG, ROBOT-ASSISTED, THORACOSCOPIC;  Surgeon: Kerrin Elspeth BROCKS, MD;  Location: [facility] OR;  Service: Thoracic;;   YAG LASER APPLICATION Right 07/12/2014   Procedure: YAG LASER APPLICATION;  Surgeon: Oneil T. Roz, MD;  Location: [facility];  Service: Ophthalmology;  Laterality: Right;   YAG LASER APPLICATION Left 08/15/2015   Procedure: YAG LASER APPLICATION;  Surgeon: Oneil Roz, MD;  Location: [facility];  Service: Ophthalmology;  Laterality: Left;    Current Outpatient Medications on File Prior to Visit  Medication Sig Dispense Refill   acetaminophen  (TYLENOL ) 650 MG CR tablet Take 650 mg by mouth 2 (two) times daily.     albuterol  (VENTOLIN  HFA) 108 (90 Base) MCG/ACT inhaler Inhale 2 puffs into the lungs every 6 (six) hours as needed for wheezing or shortness of breath. 8 g 0   amLODipine  (NORVASC ) 10 MG tablet Take 10 mg by mouth daily.     ASPIRIN  EC PO Take 325 mg by mouth daily. Swallow whole.     buPROPion  (WELLBUTRIN  XL) 150 MG 24 hr tablet Take 1 tablet (150 mg total) by mouth daily. 30 tablet 3   busPIRone  (BUSPAR ) 30 MG tablet TAKE ONE (1) TABLET BY MOUTH TWICE DAILY *NEW PRESCRIPTION REQUEST* 180 tablet 11    chlorthalidone  (HYGROTON ) 25 MG tablet Take 25 mg by mouth daily.     Cyanocobalamin  (VITAMIN B-12 PO) 1 tablet. Take 2 gummies daily.     ferrous gluconate  (FERGON) 324 MG tablet Take 324 mg by mouth daily with breakfast.     FLUoxetine  (PROZAC ) 40 MG capsule Take 1 capsule (40 mg total) by mouth daily. 90 capsule 11   gabapentin  (NEURONTIN ) 300 MG capsule Take 1 capsule (300 mg total) by mouth 2 (two) times daily. 60 capsule 3   Melatonin 1 MG CHEW Chew 2 mg by mouth at bedtime.     Multiple Vitamin (MULTIVITAMIN) tablet Take 1 tablet by mouth daily.     olmesartan -hydrochlorothiazide  (BENICAR  HCT) 20-12.5 MG tablet TAKE ONE TABLET BY MOUTH EVERY DAY 30 tablet 11   omeprazole  (PRILOSEC) 40 MG capsule TAKE 1 CAPSULE TWICE DAILY FOR 2 MONTH ONLY 180 capsule 3   oxyCODONE -acetaminophen  (PERCOCET/ROXICET) 5-325 MG tablet Take one tablet by mouth every 8 hours as needed, for rigth groin pain 15 tablet 0   Phenylephrine -Witch Hazel (HEMORRHOIDAL COOLING) 0.25-50 % GEL Applied twice a day as needed  for hemorrhoids 51 g 0   polyethylene glycol (MIRALAX  / GLYCOLAX ) 17 g packet Take 17 g by mouth daily as needed for moderate constipation.     rosuvastatin  (CRESTOR ) 20 MG tablet TAKE 1 TABLET BY MOUTH EVERY DAY *NEW PRESCRIPTION REQUEST* 90 tablet 11   sucralfate  (CARAFATE ) 1 g tablet TAKE ONE (1) TABLET BY MOUTH TWICE DAILY *NEW PRESCRIPTION REQUEST* 180 tablet 11   traZODone  (DESYREL ) 50 MG tablet TAKE 1/2-1 TABLET BY MOUTH EVERY NIGHT AT BEDTIME AS NEEDED FOR SLEEP *NEW PRESCRIPTION REQUEST* 90 tablet 11   No current facility-administered medications on file prior to visit.    Social History   Socioeconomic History   Marital status: Married    Spouse name: Not on file   Number of children: Not on file   Years of education: Not on file   Highest education level: Not on file  Occupational History   Not on file  Tobacco Use   Smoking status: Former    Current packs/day: 0.00    Average  packs/day: 0.3 packs/day for 20.0 years (5.0 ttl pk-yrs)    Types: Cigarettes    Start date: 01/05/1970    Quit date: 01/05/1990    Years since quitting: 34.5   Smokeless tobacco: Former    Types: Snuff    Quit date: 03/27/2024  Vaping Use   Vaping status: Never Used  Substance and Sexual Activity   Alcohol use: No   Drug use: Not Currently    Types: Marijuana    Comment: occ marijuana   Sexual activity: Not Currently    Birth control/protection: Surgical  Other Topics Concern   Not on file  Social History Narrative   Not on file   Social Drivers of Health   Financial Resource Strain: Low Risk  (10/22/2023)   Overall Financial Resource Strain (CARDIA)    Difficulty of Paying Living Expenses: Not hard at all  Food Insecurity: No Food Insecurity (06/18/2024)   Hunger Vital Sign    Worried About Running Out of Food in the Last Year: Never true    Ran Out of Food in the Last Year: Never true  Transportation Needs: No Transportation Needs (06/18/2024)   PRAPARE - Administrator, Civil Service (Medical): No    Lack of Transportation (Non-Medical): No  Physical Activity: Insufficiently Active (10/22/2023)   Exercise Vital Sign    Days of Exercise per Week: 3 days    Minutes of Exercise per Session: 30 min  Stress: No Stress Concern Present (10/22/2023)   [employer] of Occupational Health - Occupational Stress Questionnaire    Feeling of Stress : Not at all  Social Connections: Moderately Isolated (06/13/2024)   Social Connection and Isolation Panel    Frequency of Communication with Friends and Family: More than three times a week    Frequency of Social Gatherings with Friends and Family: More than three times a week    Attends Religious Services: More than 4 times per year    Active Member of [employer] or Organizations: No    Attends Banker Meetings: Never    Marital Status: Widowed  Intimate Partner Violence: Not At Risk (06/18/2024)   Humiliation, Afraid,  Rape, and Kick questionnaire    Fear of Current or Ex-Partner: No    Emotionally Abused: No    Physically Abused: No    Sexually Abused: No    Family History  Problem Relation Age of Onset   Colon polyps Sister 40  Diabetes Sister    Hypertension Sister    Kidney disease Sister    Arthritis Father    Cancer Father        prostate    Hypertension Father    Diabetes Sister    Hypertension Sister    CAD Paternal Grandmother    Colon cancer Neg Hx     BP 129/78   Pulse 67   Ht 5' 9 (1.753 m)   Wt 184 lb (83.5 kg)   BMI 27.17 kg/m   Body mass index is 27.17 kg/m.      Objective:   Physical Exam Vitals and nursing note reviewed. Exam conducted with a chaperone present.  Constitutional:      Appearance: She is well-developed.  HENT:     Head: Normocephalic and atraumatic.  Eyes:     Conjunctiva/sclera: Conjunctivae normal.     Pupils: Pupils are equal, round, and reactive to light.  Cardiovascular:     Rate and Rhythm: Normal rate and regular rhythm.  Pulmonary:     Effort: Pulmonary effort is normal.  Abdominal:     Palpations: Abdomen is soft.  Musculoskeletal:     Cervical back: Normal range of motion and neck supple.       Legs:  Skin:    General: Skin is warm and dry.  Neurological:     Mental Status: She is alert and oriented to person, place, and time.     Cranial Nerves: No cranial nerve deficit.     Motor: No abnormal muscle tone.     Coordination: Coordination normal.     Deep Tendon Reflexes: Reflexes are normal and symmetric. Reflexes normal.  Psychiatric:        Behavior: Behavior normal.        Thought Content: Thought content normal.        Judgment: Judgment normal.   I have independently reviewed and interpreted x-rays of this patient done at another site by another physician or qualified health professional.         Assessment & Plan:   Encounter Diagnosis  Name Primary?   Unilateral primary osteoarthritis, right hip Yes    I have shown her the X-rays and explained she has worn down her hip.  I would recommend she consider a total hip replacement.  She would like to do this.  I will have Dr. Vernetta see her.  I have briefly explained the procedure.  Call if any problem.  Precautions discussed.  Electronically Signed Lemond Stable, MD 9/11/20251:42 PM

## 2024-07-29 NOTE — Patient Instructions (Signed)
 Surgical consult appointment with Dr Vernetta  We are referring you to [facility] from [facility] address is [address] [place] The phone number is [phone]  The office will call you with an appointment Dr. Vernetta

## 2024-08-11 ENCOUNTER — Other Ambulatory Visit: Payer: Self-pay | Admitting: Family Medicine

## 2024-08-23 ENCOUNTER — Ambulatory Visit: Admitting: Orthopaedic Surgery

## 2024-08-23 VITALS — Ht 69.0 in | Wt 184.0 lb

## 2024-08-23 DIAGNOSIS — M1611 Unilateral primary osteoarthritis, right hip: Secondary | ICD-10-CM | POA: Diagnosis not present

## 2024-08-23 NOTE — Progress Notes (Signed)
 The patient is a 73 year old female that I am seeing for the first time.  She is referred from Dr. Brenna to evaluate and treat known end-stage arthritis of her right hip.  She has been dealing with severe hip pain for several months now has gotten significantly worse over the last year.  It is daily pain and 10 out of 10.  It is definitely affecting her mobility, her quality of life and her actives daily living to the point she is sent to us  to consider hip replacement surgery.  She is listed as type II diabetic but 2 months ago her hemoglobin A1c was down to 5.8.  She does ambulate using a cane to offload that right hip.  She has a harder time with her ADLs now.  On exam her left hip moves smoothly and fluidly.  The right hip has severe pain in the groin with attempts of rotation and stiffness with rotation.  She does walk with a Trendelenburg gait and there is a leg length difference with the right side towards the left.  X-rays in the canopy system of her pelvis and right hip shows severe end-stage bone-on-bone arthritis of the right hip.  There is complete loss of the joint space with sclerotic changes and cystic changes as well as osteophytes.  The left hip joint space is well-maintained.  We had a long and thorough discussion about hip replacement surgery.  We discussed the risks and benefits of the surgery and what to expect from an intraoperative and postoperative standpoint.  We showed her her x-rays and went over hip replacement model and gave her handout by hip replacement surgery.  She would like to have this performed as soon as possible so we will work on getting this scheduled.  All questions and concerns were answered and addressed.

## 2024-09-01 ENCOUNTER — Other Ambulatory Visit: Payer: Self-pay | Admitting: Physician Assistant

## 2024-09-01 DIAGNOSIS — Z01818 Encounter for other preprocedural examination: Secondary | ICD-10-CM

## 2024-09-08 ENCOUNTER — Encounter (HOSPITAL_COMMUNITY): Payer: Self-pay

## 2024-09-08 NOTE — Pre-Procedure Instructions (Signed)
 Surgical Instructions   Your procedure is scheduled on September 14, 2024. Report to [facility] Main Entrance A at 9:30 A.M., then check in with the Admitting office. Any questions or running late day of surgery: call [phone]  Questions prior to your surgery date: call [phone], Monday-Friday, 8am-4pm. If you experience any cold or flu symptoms such as cough, fever, chills, shortness of breath, etc. between now and your scheduled surgery, please notify us  at the above number.     Remember:  Do not eat after midnight the night before your surgery  You may drink clear liquids until 8:30 AM the morning of your surgery.   Clear liquids allowed are: Water , Non-Citrus Juices (without pulp), Carbonated Beverages, Clear Tea (no milk, honey, etc.), Black Coffee Only (NO MILK, CREAM OR POWDERED CREAMER of any kind), and Gatorade.  Patient Instructions  The night before surgery:  No food after midnight. ONLY clear liquids after midnight  The day of surgery (if you have diabetes): Drink ONE (1) 12 oz G2 given to you in your pre admission testing appointment by 8:30 AM the morning of surgery. Drink in one sitting. Do not sip.  This drink was given to you during your hospital  pre-op appointment visit.  Nothing else to drink after completing the  12 oz bottle of G2.         If you have questions, please contact your surgeon's office.    Take these medicines the morning of surgery with A SIP OF WATER : amLODipine  (NORVASC )  buPROPion  (WELLBUTRIN  XL)  busPIRone  (BUSPAR )  FLUoxetine  (PROZAC )  gabapentin  (NEURONTIN )  rosuvastatin  (CRESTOR )  sucralfate  (CARAFATE )    May take these medicines IF NEEDED: acetaminophen  (TYLENOL )  albuterol  (VENTOLIN  HFA) inhaler - please bring inhaler with you morning of surgery   Follow your surgeon's instructions on when to stop Aspirin .  If no instructions were given by your surgeon then you will need to call the office to get those instructions.      One week prior to surgery, STOP taking any Aleve, Naproxen, Ibuprofen , Motrin , Advil , Goody's, BC's, all herbal medications, fish oil, and non-prescription vitamins.   HOW TO MANAGE YOUR DIABETES BEFORE AND AFTER SURGERY  Why is it important to control my blood sugar before and after surgery? Improving blood sugar levels before and after surgery helps healing and can limit problems. A way of improving blood sugar control is eating a healthy diet by:  Eating less sugar and carbohydrates  Increasing activity/exercise  Talking with your doctor about reaching your blood sugar goals High blood sugars (greater than 180 mg/dL) can raise your risk of infections and slow your recovery, so you will need to focus on controlling your diabetes during the weeks before surgery. Make sure that the doctor who takes care of your diabetes knows about your planned surgery including the date and location.  How do I manage my blood sugar before surgery? Check your blood sugar at least 4 times a day, starting 2 days before surgery, to make sure that the level is not too high or low.  Check your blood sugar the morning of your surgery when you wake up and every 2 hours until you get to the Short Stay unit.  If your blood sugar is less than 70 mg/dL, you will need to treat for low blood sugar: Do not take insulin . Treat a low blood sugar (less than 70 mg/dL) with  cup of clear juice (cranberry or apple), 4 glucose tablets, OR glucose gel.  Recheck blood sugar in 15 minutes after treatment (to make sure it is greater than 70 mg/dL). If your blood sugar is not greater than 70 mg/dL on recheck, call [phone] for further instructions. Report your blood sugar to the short stay nurse when you get to Short Stay.  If you are admitted to the hospital after surgery: Your blood sugar will be checked by the staff and you will probably be given insulin  after surgery (instead of oral diabetes medicines) to make sure  you have good blood sugar levels. The goal for blood sugar control after surgery is 80-180 mg/dL.                      Do NOT Smoke (Tobacco/Vaping) for 24 hours prior to your procedure.  If you use a CPAP at night, you may bring your mask/headgear for your overnight stay.   You will be asked to remove any contacts, glasses, piercing's, hearing aid's, dentures/partials prior to surgery. Please bring cases for these items if needed.    Patients discharged the day of surgery will not be allowed to drive home, and someone needs to stay with them for 24 hours.  SURGICAL WAITING ROOM VISITATION Patients may have no more than 2 support people in the waiting area - these visitors may rotate.   Pre-op nurse will coordinate an appropriate time for 1 ADULT support person, who may not rotate, to accompany patient in pre-op.  Children under the age of 23 must have an adult with them who is not the patient and must remain in the main waiting area with an adult.  If the patient needs to stay at the hospital during part of their recovery, the visitor guidelines for inpatient rooms apply.  Please refer to the [facility] website for the visitor guidelines for any additional information.   If you received a COVID test during your pre-op visit  it is requested that you wear a mask when out in public, stay away from anyone that may not be feeling well and notify your surgeon if you develop symptoms. If you have been in contact with anyone that has tested positive in the last 10 days please notify you surgeon.      Pre-operative 4 CHG Bathing Instructions   You can play a key role in reducing the risk of infection after surgery. Your skin needs to be as free of germs as possible. You can reduce the number of germs on your skin by washing with CHG (chlorhexidine  gluconate) soap before surgery. CHG is an antiseptic soap that kills germs and continues to kill germs even after washing.   DO NOT use if you  have an allergy to chlorhexidine /CHG or antibacterial soaps. If your skin becomes reddened or irritated, stop using the CHG and notify one of our RNs at [phone].   Please shower with the CHG soap starting 4 days before surgery using the following schedule:     Please keep in mind the following:  DO NOT shave, including legs and underarms, starting the day of your first shower.   You may shave your face at any point before/day of surgery.  Place clean sheets on your bed the day you start using CHG soap. Use a clean washcloth (not used since being washed) for each shower. DO NOT sleep with pets once you start using the CHG.   CHG Shower Instructions:  Wash your face and private area with normal soap. If you choose to wash  your hair, wash first with your normal shampoo.  After you use shampoo/soap, rinse your hair and body thoroughly to remove shampoo/soap residue.  Turn the water  OFF and apply  bottle of CHG soap to a CLEAN washcloth.  Apply CHG soap ONLY FROM YOUR NECK DOWN TO YOUR TOES (washing for 3-5 minutes)  DO NOT use CHG soap on face, private areas, open wounds, or sores.  Pay special attention to the area where your surgery is being performed.  If you are having back surgery, having someone wash your back for you may be helpful. Wait 2 minutes after CHG soap is applied, then you may rinse off the CHG soap.  Pat dry with a clean towel  Put on clean clothes/pajamas   If you choose to wear lotion, please use ONLY the CHG-compatible lotions that are listed below.  Additional instructions for the day of surgery:  If you choose, you may shower the morning of surgery with an antibacterial soap.  DO NOT APPLY any lotions, deodorants, cologne, or perfumes.   Do not bring valuables to the hospital. [facility] is not responsible for any belongings/valuables. Do not wear nail polish, gel polish, artificial nails, or any other type of covering on natural nails (fingers and toes) Do  not wear jewelry or makeup Put on clean/comfortable clothes.  Please brush your teeth.  Ask your nurse before applying any prescription medications to the skin.     CHG Compatible Lotions   Aveeno Moisturizing lotion  Cetaphil Moisturizing Cream  Cetaphil Moisturizing Lotion  Clairol Herbal Essence Moisturizing Lotion, Dry Skin  Clairol Herbal Essence Moisturizing Lotion, Extra Dry Skin  Clairol Herbal Essence Moisturizing Lotion, Normal Skin  Curel Age Defying Therapeutic Moisturizing Lotion with Alpha Hydroxy  Curel Extreme Care Body Lotion  Curel Soothing Hands Moisturizing Hand Lotion  Curel Therapeutic Moisturizing Cream, Fragrance-Free  Curel Therapeutic Moisturizing Lotion, Fragrance-Free  Curel Therapeutic Moisturizing Lotion, Original Formula  Eucerin Daily Replenishing Lotion  Eucerin Dry Skin Therapy Plus Alpha Hydroxy Crme  Eucerin Dry Skin Therapy Plus Alpha Hydroxy Lotion  Eucerin Original Crme  Eucerin Original Lotion  Eucerin Plus Crme Eucerin Plus Lotion  Eucerin TriLipid Replenishing Lotion  Keri Anti-Bacterial Hand Lotion  Keri Deep Conditioning Original Lotion Dry Skin Formula Softly Scented  Keri Deep Conditioning Original Lotion, Fragrance Free Sensitive Skin Formula  Keri Lotion Fast Absorbing Fragrance Free Sensitive Skin Formula  Keri Lotion Fast Absorbing Softly Scented Dry Skin Formula  Keri Original Lotion  Keri Skin Renewal Lotion Keri Silky Smooth Lotion  Keri Silky Smooth Sensitive Skin Lotion  Nivea Body Creamy Conditioning Oil  Nivea Body Extra Enriched Lotion  Nivea Body Original Lotion  Nivea Body Sheer Moisturizing Lotion Nivea Crme  Nivea Skin Firming Lotion  NutraDerm 30 Skin Lotion  NutraDerm Skin Lotion  NutraDerm Therapeutic Skin Cream  NutraDerm Therapeutic Skin Lotion  ProShield Protective Hand Cream  Provon moisturizing lotion  Please read over the following fact sheets that you were given.

## 2024-09-09 ENCOUNTER — Encounter (HOSPITAL_COMMUNITY): Payer: Self-pay

## 2024-09-09 ENCOUNTER — Other Ambulatory Visit: Payer: Self-pay | Admitting: Thoracic Surgery (Cardiothoracic Vascular Surgery)

## 2024-09-09 ENCOUNTER — Other Ambulatory Visit: Payer: Self-pay

## 2024-09-09 ENCOUNTER — Other Ambulatory Visit: Payer: Self-pay | Admitting: Family Medicine

## 2024-09-09 ENCOUNTER — Encounter (HOSPITAL_COMMUNITY)
Admission: RE | Admit: 2024-09-09 | Discharge: 2024-09-09 | Disposition: A | Discharge status: Home / Self Care | Source: Ambulatory Visit | Attending: Orthopaedic Surgery | Admitting: Orthopaedic Surgery

## 2024-09-09 VITALS — BP 133/61 | HR 62 | Temp 98.1°F | Resp 18 | Ht 69.0 in | Wt 181.0 lb

## 2024-09-09 DIAGNOSIS — E119 Type 2 diabetes mellitus without complications: Secondary | ICD-10-CM | POA: Insufficient documentation

## 2024-09-09 DIAGNOSIS — Z01818 Encounter for other preprocedural examination: Secondary | ICD-10-CM

## 2024-09-09 DIAGNOSIS — Z01812 Encounter for preprocedural laboratory examination: Secondary | ICD-10-CM | POA: Diagnosis not present

## 2024-09-09 HISTORY — DX: Anemia, unspecified: D64.9

## 2024-09-09 HISTORY — DX: Chronic kidney disease, unspecified: N18.9

## 2024-09-09 LAB — BASIC METABOLIC PANEL WITH GFR
Anion gap: 12 (ref 5–15)
BUN: 31 mg/dL — ABNORMAL HIGH (ref 8–23)
CO2: 27 mmol/L (ref 22–32)
Calcium: 9.7 mg/dL (ref 8.9–10.3)
Chloride: 100 mmol/L (ref 98–111)
Creatinine, Ser: 1.29 mg/dL — ABNORMAL HIGH (ref 0.44–1.00)
GFR, Estimated: 44 mL/min — ABNORMAL LOW (ref >60)
Glucose, Bld: 91 mg/dL (ref 70–99)
Potassium: 3.4 mmol/L — ABNORMAL LOW (ref 3.5–5.1)
Sodium: 139 mmol/L (ref 135–145)

## 2024-09-09 LAB — HEMOGLOBIN A1C
Hgb A1c MFr Bld: 5.9 % — ABNORMAL HIGH (ref 4.8–5.6)
Mean Plasma Glucose: 122.63 mg/dL

## 2024-09-09 LAB — CBC
HCT: 35.3 % — ABNORMAL LOW (ref 36.0–46.0)
Hemoglobin: 11.1 g/dL — ABNORMAL LOW (ref 12.0–15.0)
MCH: 25.2 pg — ABNORMAL LOW (ref 26.0–34.0)
MCHC: 31.4 g/dL (ref 30.0–36.0)
MCV: 80.2 fL (ref 80.0–100.0)
Platelets: 248 K/uL (ref 150–400)
RBC: 4.4 MIL/uL (ref 3.87–5.11)
RDW: 17.2 % — ABNORMAL HIGH (ref 11.5–15.5)
WBC: 5.6 K/uL (ref 4.0–10.5)
nRBC: 0 % (ref 0.0–0.2)

## 2024-09-09 LAB — SURGICAL PCR SCREEN
MRSA, PCR: NEGATIVE
Staphylococcus aureus: NEGATIVE

## 2024-09-09 LAB — TYPE AND SCREEN
ABO/RH(D): B POS
Antibody Screen: NEGATIVE

## 2024-09-09 NOTE — Progress Notes (Signed)
 PCP - Dr. Rollene Pesa Cardiologist - Dr. Vishnu Mallipeddi - last office visit 06/01/2024  PPM/ICD - Denies Device Orders - n/a Rep Notified - n/a  Chest x-ray - 06/13/2024 EKG - 06/13/2024 Stress Test - 02/17/2024 ECHO - 02/17/2024 Cardiac Cath - 06/23/2018  Sleep Study - Denies CPAP - n/a  Pt is DM2. She only checks her blood sugars when she is symptomatic. She does not know her normal fasting range. A1c result pending.   Last dose of GLP1 agonist- n/a GLP1 instructions: n/a  Blood Thinner Instructions: n/a Aspirin  Instructions:   ERAS Protcol - Clear liquids until 0830 morning of surgery PRE-SURGERY Ensure or G2- G2 given to pt with instructions  COVID TEST- n/a   Anesthesia review: Yes. Hx of HTN, DM with recent lobectomy in April 2025 by Dr. Kerrin. Pt was also admitted to [facility] July 27 - July 31 for urosepsis  Patient denies shortness of breath, fever, cough and chest pain at PAT appointment. Pt denies any respiratory illness/infection in the last two months.   All instructions explained to the patient, with a verbal understanding of the material. Patient agrees to go over the instructions while at home for a better understanding. Patient also instructed to self quarantine after being tested for COVID-19. The opportunity to ask questions was provided.

## 2024-09-13 NOTE — H&P (Signed)
 TOTAL HIP ADMISSION H&P  Patient is admitted for right total hip arthroplasty.  Subjective:  Chief Complaint: right hip pain  HPI: Sophia [name], 73 y.o. female, has a history of pain and functional disability in the right hip(s) due to arthritis and patient has failed non-surgical conservative treatments for greater than 12 weeks to include NSAID's and/or analgesics, flexibility and strengthening excercises, use of assistive devices, weight reduction as appropriate, and activity modification.  Onset of symptoms was gradual starting several years ago with gradually worsening course since that time.The patient noted no past surgery on the right hip(s).  Patient currently rates pain in the right hip at 10 out of 10 with activity. Patient has night pain, worsening of pain with activity and weight bearing, trendelenberg gait, pain that interfers with activities of daily living, pain with passive range of motion, and joint swelling. Patient has evidence of subchondral sclerosis, periarticular osteophytes, and joint space narrowing by imaging studies. This condition presents safety issues increasing the risk of falls.  There is no current active infection.  Patient Active Problem List   Diagnosis Date Noted   Unilateral primary osteoarthritis, right hip 08/23/2024   Pericardial effusion 07/26/2024   Chronic right hip pain 07/11/2024   Encounter for support and coordination of transition of care 06/27/2024   Urinary tract infection with hematuria 06/17/2024   AKI (acute kidney injury) 06/17/2024   Grief 06/06/2024   Right hip pain 06/04/2024   CKD stage 3a, GFR 45-59 ml/min (HCC) 06/01/2024   Non-small cell carcinoma of lung, right (HCC) 05/03/2024   Status post robot-assisted surgical procedure 03/31/2024   Right lower lobe pulmonary nodule 03/31/2024   Chronic diastolic heart failure (HCC) 03/09/2024   Type 2 diabetes mellitus with other specified complication (HCC) 04/13/2023   Insomnia  04/13/2023   Neck pain 02/25/2023   DOE with cough on ACEi 11/15/2022   RUQ pain 10/16/2022   Deficiency anemia 11/11/2021   Weakness 09/19/2021   Fatigue 03/16/2021   At increased risk for cardiovascular disease 03/16/2021   Overweight (BMI 25.0-29.9) 03/16/2021   Esophageal dysphagia 01/31/2021   Neck muscle strain, initial encounter 12/26/2020   Panic anxiety syndrome 12/06/2020   Depression, major, single episode, in partial remission 12/12/2019   GAD (generalized anxiety disorder) 12/03/2018   Leg cramps 06/15/2018   Tubular adenoma of colon 04/28/2014   Tobacco dependence 04/28/2014   History of rectal bleeding 02/19/2014   Normocytic anemia 02/17/2014   Back pain with radiation 10/05/2013   Seasonal allergies 05/01/2011   Palpitations 05/17/2009   DYSPNEA 05/17/2009   Hyperlipidemia with target LDL less than 100 06/17/2008   Depression with anxiety 11/21/2006   Essential hypertension 11/21/2006   Gastroesophageal reflux disease 11/21/2006   Past Medical History:  Diagnosis Date   Acute respiratory disease due to COVID-19 virus 08/28/2019   Anemia    Iron  Deficiency   Anxiety    Arthritis    Cancer (HCC) 02/2024   Lung Cancer   Chronic kidney disease    Depression    Diabetes mellitus type 2 in obese 11/28/2010   Qualifier: Diagnosis of  By: Antonetta MD, Margaret  Diet controlled in 12/2016    Diabetes mellitus without complication (HCC)    Hypercholesteremia    Hypertension    PONV (postoperative nausea and vomiting)    Septic shock (HCC) 06/13/2024    Past Surgical History:  Procedure Laterality Date   ABDOMINAL HYSTERECTOMY  11/18/1980   tubes and womb, bleeding and ectopic  ABDOMINAL SURGERY     removal of tumors   BALLOON DILATION N/A 02/16/2021   Procedure: BALLOON DILATION;  Surgeon: Cindie Carlin POUR, DO;  [facility];  Service: Endoscopy;  Laterality: N/A;   BIOPSY  02/16/2021   Procedure: BIOPSY;  Surgeon: Cindie Carlin POUR, DO;   Location: [facility];  Service: Endoscopy;;   BREAST SURGERY Left 11/18/1981   benign tumor   CATARACT EXTRACTION W/PHACO Right 01/11/2014   Procedure: CATARACT EXTRACTION PHACO AND INTRAOCULAR LENS PLACEMENT (IOC);  Surgeon: Oneil T. Roz, MD;  Location: [facility];  Service: Ophthalmology;  Laterality: Right;  CDE 5.57   CATARACT EXTRACTION W/PHACO Left 01/25/2014   Procedure: CATARACT EXTRACTION PHACO AND INTRAOCULAR LENS PLACEMENT (IOC);  Surgeon: Oneil T. Roz, MD;  Location: [facility];  Service: Ophthalmology;  Laterality: Left;  CDE:5.19   COLONOSCOPY  2007 [facility]   NL EXAM   COLONOSCOPY N/A 04/22/2014   Dr. Fields:Normal mucosa in the terminal ileum/Two COLON polyps REMOVED/ Mild diverticulosis in the ascending colon and transverse colon/The LEFT colon IS redundant/Small internal hemorrhoids. Path: tubular adenoma. Next colonoscopy in 5-10 years   COLONOSCOPY WITH PROPOFOL  N/A 11/27/2020   Surgeon: Cindie Carlin POUR, DO;  Nonbleeding internal hemorrhoids, one 6 mm transverse colon polyp resected, diverticulosis in the sigmoid colon.  Pathology with tubular adenoma.  Repeat in 5 years.   ESOPHAGOGASTRODUODENOSCOPY N/A 04/22/2014   Dr. Fields:MICROCYTIC ANEMIA MOST LIKELY DUE TO ASA/VOLTAREN /Small hiatal hernia/MODERATE Non-erosive gastritis. Negative H.pylori   ESOPHAGOGASTRODUODENOSCOPY (EGD) WITH PROPOFOL  N/A 02/16/2021   Surgeon: Cindie Carlin POUR, DO; benign-appearing esophageal stenosis s/p dilation, gastritis biopsied.  Biopsies were benign.   INTERCOSTAL NERVE BLOCK Right 03/31/2024   Procedure: BLOCK, NERVE, INTERCOSTAL;  Surgeon: Kerrin Elspeth BROCKS, MD;  Location: [facility] OR;  Service: Thoracic;  Laterality: Right;   LEFT HEART CATH AND CORONARY ANGIOGRAPHY N/A 06/23/2018   Procedure: LEFT HEART CATH AND CORONARY ANGIOGRAPHY;  Surgeon: Dann Candyce RAMAN, MD;  Location: [facility] INVASIVE CV LAB;  Service: Cardiovascular;  Laterality: N/A;   LYMPH NODE BIOPSY Right 03/31/2024   Procedure:  LYMPH NODE BIOPSY;  Surgeon: Kerrin Elspeth BROCKS, MD;  Location: [facility] OR;  Service: Thoracic;  Laterality: Right;   POLYPECTOMY  11/27/2020   Procedure: POLYPECTOMY;  Surgeon: Cindie Carlin POUR, DO;  [facility];  Service: Endoscopy;;   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   ULTRASOUND GUIDANCE FOR VASCULAR ACCESS  06/23/2018   Procedure: Ultrasound Guidance For Vascular Access;  Surgeon: Dann Candyce RAMAN, MD;  Location: [facility] INVASIVE CV LAB;  Service: Cardiovascular;;   UPPER GASTROINTESTINAL ENDOSCOPY  2007 CHEST PAIN   NL EXAM   WEDGE RESECTION, LUNG, ROBOT-ASSISTED, THORACOSCOPIC  03/31/2024   Procedure: WEDGE RESECTION, LUNG, ROBOT-ASSISTED, THORACOSCOPIC;  Surgeon: Kerrin Elspeth BROCKS, MD;  Location: [facility] OR;  Service: Thoracic;;   YAG LASER APPLICATION Right 07/12/2014   Procedure: YAG LASER APPLICATION;  Surgeon: Oneil T. Roz, MD;  Location: [facility];  Service: Ophthalmology;  Laterality: Right;   YAG LASER APPLICATION Left 08/15/2015   Procedure: YAG LASER APPLICATION;  Surgeon: Oneil Roz, MD;  Location: [facility];  Service: Ophthalmology;  Laterality: Left;    No current facility-administered medications for this encounter.   Current Outpatient Medications  Medication Sig Dispense Refill Last Dose/Taking   acetaminophen  (TYLENOL ) 650 MG CR tablet Take 650 mg by mouth every 8 (eight) hours as needed for pain.   Taking As Needed   albuterol  (VENTOLIN  HFA) 108 (90 Base) MCG/ACT inhaler  Inhale 2 puffs into the lungs every 6 (six) hours as needed for wheezing or shortness of breath. 8 g 0 Taking As Needed   amLODipine  (NORVASC ) 10 MG tablet Take 10 mg by mouth daily.   Taking   aspirin  EC 325 MG tablet Take 325 mg by mouth daily.   Taking   buPROPion  (WELLBUTRIN  XL) 150 MG 24 hr tablet Take 1 tablet (150 mg total) by mouth daily. 30 tablet 3 Taking   chlorthalidone  (HYGROTON ) 25 MG tablet Take 25 mg by mouth daily.   Taking   Cyanocobalamin  (VITAMIN B-12 PO)  Take 2 tablets by mouth daily.   Taking   ferrous gluconate  (FERGON) 324 MG tablet Take 324 mg by mouth daily with breakfast.   Taking   FLUoxetine  (PROZAC ) 40 MG capsule Take 1 capsule (40 mg total) by mouth daily. 90 capsule 11 Taking   Melatonin 1 MG CHEW Chew 2 mg by mouth at bedtime as needed (sleep).   Taking As Needed   Multiple Vitamin (MULTIVITAMIN) tablet Take 1 tablet by mouth daily.   Taking   olmesartan -hydrochlorothiazide  (BENICAR  HCT) 20-12.5 MG tablet TAKE ONE TABLET BY MOUTH EVERY DAY 30 tablet 11 Taking   polyethylene glycol (MIRALAX  / GLYCOLAX ) 17 g packet Take 17 g by mouth daily as needed for moderate constipation.   Taking As Needed   rosuvastatin  (CRESTOR ) 20 MG tablet TAKE 1 TABLET BY MOUTH EVERY DAY *NEW PRESCRIPTION REQUEST* 90 tablet 11 Taking   sucralfate  (CARAFATE ) 1 g tablet TAKE ONE (1) TABLET BY MOUTH TWICE DAILY *NEW PRESCRIPTION REQUEST* 180 tablet 11 Taking   busPIRone  (BUSPAR ) 30 MG tablet Take one tablet by mouth two times daily 60 tablet 3    gabapentin  (NEURONTIN ) 300 MG capsule Take 1 capsule (300 mg total) by mouth 2 (two) times daily. 60 capsule 3    omeprazole  (PRILOSEC) 40 MG capsule TAKE 1 CAPSULE TWICE DAILY FOR 2 MONTH ONLY (Patient not taking: Reported on 09/08/2024) 180 capsule 3 Not Taking   oxyCODONE -acetaminophen  (PERCOCET/ROXICET) 5-325 MG tablet Take one tablet by mouth every 8 hours as needed, for rigth groin pain (Patient not taking: Reported on 09/08/2024) 15 tablet 0 Not Taking   Phenylephrine -Witch Hazel (HEMORRHOIDAL COOLING) 0.25-50 % GEL Applied twice a day as needed for hemorrhoids (Patient not taking: Reported on 09/08/2024) 51 g 0 Not Taking   traZODone  (DESYREL ) 50 MG tablet Take 0.5-1 tablets (25-50 mg total) by mouth at bedtime as needed for sleep. (Patient not taking: Reported on 09/08/2024) 30 tablet 1 Not Taking   Allergies  Allergen Reactions   Farxiga  [Dapagliflozin ] Other (See Comments)    Caused UTI/admitted with sepsis    Jardiance [Empagliflozin] Other (See Comments)    Caused UTI/admitted with sepsis     Social History   Tobacco Use   Smoking status: Former    Current packs/day: 0.00    Average packs/day: 0.3 packs/day for 20.0 years (5.0 ttl pk-yrs)    Types: Cigarettes    Start date: 01/05/1970    Quit date: 01/05/1990    Years since quitting: 34.7   Smokeless tobacco: Current    Types: Snuff  Substance Use Topics   Alcohol use: No    Family History  Problem Relation Age of Onset   Colon polyps Sister 81   Diabetes Sister    Hypertension Sister    Kidney disease Sister    Arthritis Father    Cancer Father        prostate  Hypertension Father    Diabetes Sister    Hypertension Sister    CAD Paternal Grandmother    Colon cancer Neg Hx      Review of Systems  Objective:  Physical Exam Vitals reviewed.  Constitutional:      Appearance: Normal appearance. She is normal weight.  HENT:     Head: Normocephalic and atraumatic.  Eyes:     Extraocular Movements: Extraocular movements intact.     Pupils: Pupils are equal, round, and reactive to light.  Cardiovascular:     Rate and Rhythm: Normal rate.  Pulmonary:     Effort: Pulmonary effort is normal.  Abdominal:     Palpations: Abdomen is soft.  Musculoskeletal:     Cervical back: Normal range of motion and neck supple.     Left hip: Tenderness and bony tenderness present. Decreased range of motion. Decreased strength.  Neurological:     Mental Status: She is alert and oriented to person, place, and time.  Psychiatric:        Behavior: Behavior normal.     Vital signs in last 24 hours:    Labs:   Estimated body mass index is 26.73 kg/m as calculated from the following:   Height as of 09/09/24: 5' 9 (1.753 m).   Weight as of 09/09/24: 82.1 kg.   Imaging Review Plain radiographs demonstrate severe degenerative joint disease of the right hip(s). The bone quality appears to be good for age and reported activity  level.      Assessment/Plan:  End stage arthritis, right hip(s)  The patient history, physical examination, clinical judgement of the provider and imaging studies are consistent with end stage degenerative joint disease of the right hip(s) and total hip arthroplasty is deemed medically necessary. The treatment options including medical management, injection therapy, arthroscopy and arthroplasty were discussed at length. The risks and benefits of total hip arthroplasty were presented and reviewed. The risks due to aseptic loosening, infection, stiffness, dislocation/subluxation,  thromboembolic complications and other imponderables were discussed.  The patient acknowledged the explanation, agreed to proceed with the plan and consent was signed. Patient is being admitted for inpatient treatment for surgery, pain control, PT, OT, prophylactic antibiotics, VTE prophylaxis, progressive ambulation and ADL's and discharge planning.The patient is planning to be discharged home with home health services

## 2024-09-14 ENCOUNTER — Encounter (HOSPITAL_COMMUNITY): Payer: Self-pay | Admitting: Orthopaedic Surgery

## 2024-09-14 ENCOUNTER — Ambulatory Visit (HOSPITAL_COMMUNITY): Payer: Self-pay | Admitting: Anesthesiology

## 2024-09-14 ENCOUNTER — Other Ambulatory Visit: Payer: Self-pay

## 2024-09-14 ENCOUNTER — Ambulatory Visit (HOSPITAL_COMMUNITY)

## 2024-09-14 ENCOUNTER — Inpatient Hospital Stay (HOSPITAL_COMMUNITY)
Admission: RE | Admit: 2024-09-14 | Discharge: 2024-09-16 | DRG: 470 | Disposition: A | Discharge status: Home Health | Attending: Orthopaedic Surgery | Admitting: Orthopaedic Surgery

## 2024-09-14 ENCOUNTER — Encounter (HOSPITAL_COMMUNITY)
Admission: RE | Disposition: A | Discharge status: Home Health | Payer: Self-pay | Source: Home / Self Care | Attending: Orthopaedic Surgery

## 2024-09-14 DIAGNOSIS — E119 Type 2 diabetes mellitus without complications: Secondary | ICD-10-CM

## 2024-09-14 DIAGNOSIS — I130 Hypertensive heart and chronic kidney disease with heart failure and stage 1 through stage 4 chronic kidney disease, or unspecified chronic kidney disease: Secondary | ICD-10-CM | POA: Diagnosis present

## 2024-09-14 DIAGNOSIS — Z87891 Personal history of nicotine dependence: Secondary | ICD-10-CM

## 2024-09-14 DIAGNOSIS — E7800 Pure hypercholesterolemia, unspecified: Secondary | ICD-10-CM | POA: Diagnosis present

## 2024-09-14 DIAGNOSIS — Z8261 Family history of arthritis: Secondary | ICD-10-CM | POA: Diagnosis not present

## 2024-09-14 DIAGNOSIS — Z9181 History of falling: Secondary | ICD-10-CM

## 2024-09-14 DIAGNOSIS — Z833 Family history of diabetes mellitus: Secondary | ICD-10-CM | POA: Diagnosis not present

## 2024-09-14 DIAGNOSIS — Z8249 Family history of ischemic heart disease and other diseases of the circulatory system: Secondary | ICD-10-CM

## 2024-09-14 DIAGNOSIS — Z79899 Other long term (current) drug therapy: Secondary | ICD-10-CM | POA: Diagnosis not present

## 2024-09-14 DIAGNOSIS — Z85118 Personal history of other malignant neoplasm of bronchus and lung: Secondary | ICD-10-CM

## 2024-09-14 DIAGNOSIS — I10 Essential (primary) hypertension: Secondary | ICD-10-CM | POA: Diagnosis not present

## 2024-09-14 DIAGNOSIS — Z96641 Presence of right artificial hip joint: Secondary | ICD-10-CM

## 2024-09-14 DIAGNOSIS — E1169 Type 2 diabetes mellitus with other specified complication: Secondary | ICD-10-CM | POA: Diagnosis present

## 2024-09-14 DIAGNOSIS — M1611 Unilateral primary osteoarthritis, right hip: Secondary | ICD-10-CM

## 2024-09-14 DIAGNOSIS — Z83719 Family history of colon polyps, unspecified: Secondary | ICD-10-CM

## 2024-09-14 DIAGNOSIS — F411 Generalized anxiety disorder: Secondary | ICD-10-CM | POA: Diagnosis present

## 2024-09-14 DIAGNOSIS — Z8419 Family history of other disorders of kidney and ureter: Secondary | ICD-10-CM | POA: Diagnosis not present

## 2024-09-14 DIAGNOSIS — Z888 Allergy status to other drugs, medicaments and biological substances status: Secondary | ICD-10-CM

## 2024-09-14 DIAGNOSIS — D631 Anemia in chronic kidney disease: Secondary | ICD-10-CM | POA: Diagnosis present

## 2024-09-14 DIAGNOSIS — I5032 Chronic diastolic (congestive) heart failure: Secondary | ICD-10-CM | POA: Diagnosis present

## 2024-09-14 DIAGNOSIS — E1122 Type 2 diabetes mellitus with diabetic chronic kidney disease: Secondary | ICD-10-CM | POA: Diagnosis present

## 2024-09-14 DIAGNOSIS — K219 Gastro-esophageal reflux disease without esophagitis: Secondary | ICD-10-CM | POA: Diagnosis present

## 2024-09-14 DIAGNOSIS — J449 Chronic obstructive pulmonary disease, unspecified: Secondary | ICD-10-CM

## 2024-09-14 DIAGNOSIS — Z7982 Long term (current) use of aspirin: Secondary | ICD-10-CM | POA: Diagnosis not present

## 2024-09-14 DIAGNOSIS — Z860101 Personal history of adenomatous and serrated colon polyps: Secondary | ICD-10-CM | POA: Diagnosis not present

## 2024-09-14 DIAGNOSIS — N1831 Chronic kidney disease, stage 3a: Secondary | ICD-10-CM | POA: Diagnosis present

## 2024-09-14 DIAGNOSIS — Z8616 Personal history of COVID-19: Secondary | ICD-10-CM

## 2024-09-14 DIAGNOSIS — Z90710 Acquired absence of both cervix and uterus: Secondary | ICD-10-CM | POA: Diagnosis not present

## 2024-09-14 HISTORY — PX: TOTAL HIP ARTHROPLASTY: SHX124

## 2024-09-14 LAB — GLUCOSE, CAPILLARY
Glucose-Capillary: 110 mg/dL — ABNORMAL HIGH (ref 70–99)
Glucose-Capillary: 99 mg/dL (ref 70–99)
Glucose-Capillary: 87 mg/dL (ref 70–99)
Glucose-Capillary: 93 mg/dL (ref 70–99)

## 2024-09-14 SURGERY — ARTHROPLASTY, HIP, TOTAL, ANTERIOR APPROACH
Anesthesia: Spinal | Site: Hip | Laterality: Right

## 2024-09-14 MED ORDER — INSULIN ASPART 100 UNIT/ML IJ SOLN: 0.0000 [IU] | INTRAMUSCULAR | Status: DC | PRN

## 2024-09-14 MED ORDER — TRANEXAMIC ACID-NACL 1000-0.7 MG/100ML-% IV SOLN
1000.0000 mg | INTRAVENOUS | Status: AC
  Administered 2024-09-14: 1000 mg via INTRAVENOUS
  Filled 2024-09-14: qty 100

## 2024-09-14 MED ORDER — 0.9 % SODIUM CHLORIDE (POUR BTL) OPTIME
TOPICAL | Status: DC | PRN
  Administered 2024-09-14: 1000 mL

## 2024-09-14 MED ORDER — OXYCODONE HCL 5 MG PO TABS
5.0000 mg | ORAL_TABLET | Freq: Once | ORAL | Status: AC | PRN
  Administered 2024-09-14: 5 mg via ORAL

## 2024-09-14 MED ORDER — HYDROMORPHONE HCL 1 MG/ML IJ SOLN
0.5000 mg | INTRAMUSCULAR | Status: DC | PRN
  Administered 2024-09-14 – 2024-09-15 (×6): 1 mg via INTRAVENOUS
  Filled 2024-09-14 (×6): qty 1

## 2024-09-14 MED ORDER — ORAL CARE MOUTH RINSE: 15.0000 mL | Freq: Once | OROMUCOSAL | Status: AC

## 2024-09-14 MED ORDER — POVIDONE-IODINE 10 % EX SWAB
2.0000 | Freq: Once | CUTANEOUS | Status: AC
Start: 2024-09-14 — End: 2024-09-14
  Administered 2024-09-14: 2 via TOPICAL

## 2024-09-14 MED ORDER — FENTANYL CITRATE (PF) 100 MCG/2ML IJ SOLN
INTRAMUSCULAR | Status: DC | PRN
  Administered 2024-09-14 (×2): 50 ug via INTRAVENOUS

## 2024-09-14 MED ORDER — LIDOCAINE 2% (20 MG/ML) 5 ML SYRINGE
INTRAMUSCULAR | Status: AC
  Filled 2024-09-14: qty 5

## 2024-09-14 MED ORDER — LACTATED RINGERS IV SOLN: INTRAVENOUS | Status: DC

## 2024-09-14 MED ORDER — ACETAMINOPHEN 500 MG PO TABS
1000.0000 mg | ORAL_TABLET | Freq: Once | ORAL | Status: AC
  Administered 2024-09-14: 1000 mg via ORAL
  Filled 2024-09-14: qty 2

## 2024-09-14 MED ORDER — PROPOFOL 500 MG/50ML IV EMUL
INTRAVENOUS | Status: DC | PRN
  Administered 2024-09-14: 30 ug/kg/min via INTRAVENOUS

## 2024-09-14 MED ORDER — HYDROMORPHONE HCL 1 MG/ML IJ SOLN
INTRAMUSCULAR | Status: AC
  Filled 2024-09-14: qty 1

## 2024-09-14 MED ORDER — FENTANYL CITRATE (PF) 100 MCG/2ML IJ SOLN
INTRAMUSCULAR | Status: AC
  Filled 2024-09-14: qty 2

## 2024-09-14 MED ORDER — CEFAZOLIN SODIUM-DEXTROSE 2-4 GM/100ML-% IV SOLN
2.0000 g | INTRAVENOUS | Status: AC
  Administered 2024-09-14: 2 g via INTRAVENOUS
  Filled 2024-09-14: qty 100

## 2024-09-14 MED ORDER — OXYCODONE HCL 5 MG/5ML PO SOLN: 5.0000 mg | Freq: Once | ORAL | Status: AC | PRN

## 2024-09-14 MED ORDER — BUPIVACAINE IN DEXTROSE 0.75-8.25 % IT SOLN
INTRATHECAL | Status: DC | PRN
Start: 2024-09-14 — End: 2024-09-14
  Administered 2024-09-14: 12 mg via INTRATHECAL

## 2024-09-14 MED ORDER — ONDANSETRON HCL 4 MG/2ML IJ SOLN
INTRAMUSCULAR | Status: DC | PRN
  Administered 2024-09-14: 4 mg via INTRAVENOUS

## 2024-09-14 MED ORDER — MIDAZOLAM HCL (PF) 2 MG/2ML IJ SOLN: 0.5000 mg | Freq: Once | INTRAMUSCULAR | Status: DC | PRN

## 2024-09-14 MED ORDER — DEXMEDETOMIDINE HCL IN NACL 80 MCG/20ML IV SOLN
INTRAVENOUS | Status: DC | PRN
Start: 2024-09-14 — End: 2024-09-14
  Administered 2024-09-14 (×3): 4 ug via INTRAVENOUS

## 2024-09-14 MED ORDER — OXYCODONE HCL 5 MG PO TABS
ORAL_TABLET | ORAL | Status: AC
  Filled 2024-09-14: qty 1

## 2024-09-14 MED ORDER — SODIUM CHLORIDE 0.9 % IR SOLN
Status: DC | PRN
  Administered 2024-09-14: 1

## 2024-09-14 MED ORDER — HYDROMORPHONE HCL 1 MG/ML IJ SOLN
0.2500 mg | INTRAMUSCULAR | Status: DC | PRN
  Administered 2024-09-14 (×2): 0.5 mg via INTRAVENOUS

## 2024-09-14 MED ORDER — CHLORHEXIDINE GLUCONATE 0.12 % MT SOLN
15.0000 mL | Freq: Once | OROMUCOSAL | Status: AC
  Administered 2024-09-14: 15 mL via OROMUCOSAL
  Filled 2024-09-14: qty 15

## 2024-09-14 MED ORDER — ONDANSETRON HCL 4 MG/2ML IJ SOLN
INTRAMUSCULAR | Status: AC
  Filled 2024-09-14: qty 2

## 2024-09-14 MED ORDER — PHENYLEPHRINE HCL-NACL 20-0.9 MG/250ML-% IV SOLN
INTRAVENOUS | Status: DC | PRN
Start: 2024-09-14 — End: 2024-09-14
  Administered 2024-09-14: 20 ug/min via INTRAVENOUS

## 2024-09-14 MED ORDER — PROPOFOL 10 MG/ML IV BOLUS
INTRAVENOUS | Status: DC | PRN
  Administered 2024-09-14 (×2): 20 mg via INTRAVENOUS

## 2024-09-14 SURGICAL SUPPLY — 38 items
BENZOIN TINCTURE PRP APPL 2/3 (GAUZE/BANDAGES/DRESSINGS) ×2 IMPLANT
BLADE SAW SGTL 18X1.27X75 (BLADE) ×2 IMPLANT
COVER SURGICAL LIGHT HANDLE (MISCELLANEOUS) ×2 IMPLANT
DRAPE C-ARM 42X72 X-RAY (DRAPES) ×2 IMPLANT
DRAPE STERI IOBAN 125X83 (DRAPES) ×2 IMPLANT
DRAPE U-SHAPE 47X51 STRL (DRAPES) ×6 IMPLANT
DRSG AQUACEL AG ADV 3.5X10 (GAUZE/BANDAGES/DRESSINGS) ×2 IMPLANT
DURAPREP 26ML APPLICATOR (WOUND CARE) ×2 IMPLANT
ELECT BLADE 6.5 EXT (BLADE) IMPLANT
ELECTRODE BLDE 4.0 EZ CLN MEGD (MISCELLANEOUS) ×2 IMPLANT
ELECTRODE REM PT RTRN 9FT ADLT (ELECTROSURGICAL) ×2 IMPLANT
FACESHIELD WRAPAROUND OR TEAM (MASK) ×4 IMPLANT
GLOVE BIOGEL PI IND STRL 8 (GLOVE) ×4 IMPLANT
GLOVE ECLIPSE 8.0 STRL XLNG CF (GLOVE) ×2 IMPLANT
GLOVE ORTHO TXT STRL SZ7.5 (GLOVE) ×4 IMPLANT
GOWN STRL REUS W/ TWL LRG LVL3 (GOWN DISPOSABLE) ×4 IMPLANT
GOWN STRL REUS W/ TWL XL LVL3 (GOWN DISPOSABLE) ×4 IMPLANT
HEAD M SROM 36MM PLUS 1.5 (Hips) IMPLANT
KIT BASIN OR (CUSTOM PROCEDURE TRAY) ×2 IMPLANT
KIT TURNOVER KIT B (KITS) ×2 IMPLANT
LINER ACETAB NEUTRAL 36ID 520D (Liner) IMPLANT
MANIFOLD NEPTUNE II (INSTRUMENTS) ×2 IMPLANT
PACK TOTAL JOINT (CUSTOM PROCEDURE TRAY) ×2 IMPLANT
PAD ARMBOARD POSITIONER FOAM (MISCELLANEOUS) ×2 IMPLANT
PIN SECTOR W/GRIP ACE CUP 52MM (Hips) IMPLANT
SET HNDPC FAN SPRY TIP SCT (DISPOSABLE) ×2 IMPLANT
SOLN 0.9% NACL POUR BTL 1000ML (IV SOLUTION) ×2 IMPLANT
SOLN STERILE WATER BTL 1000 ML (IV SOLUTION) ×4 IMPLANT
STAPLER SKIN PROX 35W (STAPLE) IMPLANT
STEM FEM ACTIS HIGH SZ3 (Stem) IMPLANT
STRIP CLOSURE SKIN 1/2X4 (GAUZE/BANDAGES/DRESSINGS) ×4 IMPLANT
SUT ETHIBOND NAB CT1 #1 30IN (SUTURE) ×2 IMPLANT
SUT MNCRL AB 4-0 PS2 18 (SUTURE) IMPLANT
SUT VIC AB 0 CT1 27XBRD ANBCTR (SUTURE) ×2 IMPLANT
SUT VIC AB 1 CT1 27XBRD ANBCTR (SUTURE) ×2 IMPLANT
SUT VIC AB 2-0 CT1 TAPERPNT 27 (SUTURE) ×2 IMPLANT
TOWEL GREEN STERILE (TOWEL DISPOSABLE) ×2 IMPLANT
TRAY FOLEY W/BAG SLVR 16FR ST (SET/KITS/TRAYS/PACK) IMPLANT

## 2024-09-14 NOTE — Transfer of Care (Signed)
 Immediate Anesthesia Transfer of Care Note  Patient: Sophia [name]  Procedure(s) Performed: RIGHT TOTAL HIP ARTHROPLASTY, ANTERIOR APPROACH (Right: Hip)  Patient Location: PACU  Anesthesia Type:Spinal  Level of Consciousness: awake, alert , oriented, and patient cooperative  Airway & Oxygen Therapy: Patient Spontanous Breathing and Patient connected to face mask oxygen  Post-op Assessment: Report given to RN and Post -op Vital signs reviewed and stable  Post vital signs: Reviewed and stable  Last Vitals:  Vitals Value Taken Time  BP 102/56 09/14/24 14:08  Temp    Pulse 57 09/14/24 14:11  Resp 8 09/14/24 14:10  SpO2 99 % 09/14/24 14:11  Vitals shown include unfiled device data.  Last Pain:  Vitals:   09/14/24 1012  TempSrc:   PainSc: 10-Worst pain ever         Complications: No notable events documented.

## 2024-09-14 NOTE — Anesthesia Postprocedure Evaluation (Signed)
 Anesthesia Post Note  Patient: Sophia [name]  Procedure(s) Performed: RIGHT TOTAL HIP ARTHROPLASTY, ANTERIOR APPROACH (Right: Hip)     Patient location during evaluation: PACU Anesthesia Type: Spinal Level of consciousness: awake and alert, oriented and patient cooperative Pain management: pain level controlled Vital Signs Assessment: post-procedure vital signs reviewed and stable Respiratory status: spontaneous breathing, nonlabored ventilation and respiratory function stable Cardiovascular status: blood pressure returned to baseline and stable Postop Assessment: no apparent nausea or vomiting, spinal receding and patient able to bend at knees Anesthetic complications: no   No notable events documented.  Last Vitals:  Vitals:   09/14/24 1515 09/14/24 1530  BP: 118/62 116/68  Pulse: (!) 56 (!) 55  Resp: 14 11  Temp:    SpO2: 100% 100%    Last Pain:  Vitals:   09/14/24 1530  TempSrc:   PainSc: 8                  Mell Mellott,E. Evalin Shawhan

## 2024-09-14 NOTE — Discharge Instructions (Signed)

## 2024-09-14 NOTE — Interval H&P Note (Signed)
 History and Physical Interval Note: The patient understands that she is here today for a right total hip replacement to treat her significant left hip pain and arthritis.  There has been no acute or interval change in her medical status.  The right operative hip has been marked.  The risks and benefits of surgery been discussed.  Informed consent has been obtained.  09/14/2024 10:50 AM  Sophia [name]  has presented today for surgery, with the diagnosis of osteoarthritis right hip.  The various methods of treatment have been discussed with the patient and family. After consideration of risks, benefits and other options for treatment, the patient has consented to  Procedure(s): RIGHT TOTAL HIP ARTHROPLASTY, ANTERIOR APPROACH (Right) as a surgical intervention.  The patient's history has been reviewed, patient examined, no change in status, stable for surgery.  I have reviewed the patient's chart and labs.  Questions were answered to the patient's satisfaction.     Lonni CINDERELLA Poli

## 2024-09-14 NOTE — Anesthesia Procedure Notes (Signed)
 Spinal  Patient location during procedure: OR End time: 09/14/2024 12:30 PM Reason for block: surgical anesthesia Staffing Performed: anesthesiologist  Anesthesiologist: Sophia Athens, MD Performed by: Sophia Athens, MD Authorized by: Sophia Athens, MD   Preanesthetic Checklist Completed: patient identified, IV checked, site marked, risks and benefits discussed, surgical consent, monitors and equipment checked, pre-op evaluation and timeout performed Spinal Block Patient position: sitting Prep: DuraPrep Patient monitoring: heart rate, cardiac monitor, continuous pulse ox and blood pressure Approach: midline Location: L3-4 Injection technique: single-shot Needle Needle type: Pencan and Introducer  Needle gauge: 24 G Needle length: 9 cm Assessment Sensory level: T4 Events: CSF return Additional Notes Pt identified in Operating room.  Monitors applied. Working IV access confirmed. Timeout, Sterile prep, drape lumbar spine.  1% lido local L 3,4.  #24ga Pencan into clear CSF L 3,4.  12mg  0.75% Bupivacaine  with dextrose  injected with asp CSF beginning and end of injection.  Patient asymptomatic, VSS, no heme aspirated, tolerated well.  Sophia [name] Leonce, MD

## 2024-09-14 NOTE — Plan of Care (Signed)

## 2024-09-14 NOTE — Op Note (Signed)
 Operative Note  Date of operation: 09/14/2024 Preoperative diagnosis: Right hip primary osteoarthritis Postoperative diagnosis: Same  Procedure: Right direct anterior total hip arthroplasty  Implants: Implant Name Type Inv. Item Serial No. Manufacturer Lot No. LRB No. Used Action  PIN SECTOR W/GRIP ACE CUP - [id number] Hips PIN SECTOR W/GRIP ACE CUP  [employer] [id number] Right 1 Implanted  LINER ACETAB NEUTRAL 36ID 520D - [id number] Liner LINER ACETAB NEUTRAL 36ID 520D  [employer] [id number] Right 1 Implanted  STEM FEM ACTIS HIGH SZ3 - [id number] Stem STEM FEM ACTIS HIGH SZ3  [employer] [id number] Right 1 Implanted  HEAD M SROM PLUS 1.5 - [id number] Hips HEAD M SROM PLUS 1.5  [employer] [id number] Right 1 Implanted   Surgeon: Lonni GRADE. Vernetta, MD Assistant: Tory Gaskins, PA-C  Anesthesia: Spinal EBL: 250 cc Antibiotics: IV Ancef  Complications: None  Indications: Patient is an active 73 year old female with well-documented debilitating arthritis involving her right hip.  This has been seen under x-ray findings and clinical exam findings.  At this point her hip pain is daily and it is detrimentally affecting her mobility, quality of life and actives daily living to the point she does wish to proceed with a replacement on her right side.  We did discuss the risks of acute blood loss anemia, nerve and vessel injury, fracture, infection, DVT, dislocation, implant failure, ligament damages and wound healing issues.  She understands that our goals are hopefully decreased pain, improved mobility and improved quality of life.  Procedure description: After informed consent was obtained and the appropriate right hip was marked, the patient was brought to the operating room and sat up on the stretcher where spinal anesthesia was obtained.  She was then laid in spine position on stretcher and a Foley catheter was placed.  Next traction boots were placed  on both her feet and then she was placed supine on the Hana fracture table with a perineal post and placed in both legs and inline skeletal traction devices no traction applied.  Her right operative hip and pelvis were assessed radiographically.  The right hip was prepped and draped with DuraPrep and sterile drapes.  A timeout was called and she was identified as the correct patient direct right hip.  An incision was then made just inferior and posterior to the ASIS and carried slightly obliquely down the leg.  Dissection was carried down to the tensor fascia lata muscle and the tensor fascia was then divided longitudinally to proceed with a direct and approach the hip.  Circumflex vessels were identified and cauterized.  The hip capsule was identified and opened up in L-type format.  Cobra retractors were then placed on the medial and lateral femoral neck and a femoral neck cut was made with an oscillating saw just proximal to the left trochanter and this cut was completed with an osteotome.  A corkscrew guide is placed in the femoral head and the femoral head was removed in its entirety and there was a wide area devoid of cartilage.  A bent Hohmann was then placed over the medial acetabular rim and remnants of the acetabular labrum and other debris removed.  Reaming was initiated from a size 43 reamer and stepwise increments going up to a size 51 reamer with all reamers placed under direct visualization and the last reamer also placed under direct fluoroscopy in order to obtain the depth reaming, the inclination and anteversion.  The real DePuy sector GRIPTION acetabular component size 52 was  then placed without difficulty followed by a 36+4 polythene liner.  Attention was then turned to the femur.  With the right leg externally rotated to 120 degrees, extended and adducted, and medial retractors placed medially and Hohmann tractor around the greater trochanter.  The lateral joint capsule was released and a box  cutting osteotome was used into the femoral canal.  Broaching was then initiated using the Actis broaching system from a size 0 going up to a size 3.  With a size 3 in place we trialed a standard offset femoral neck and a 36+1.5 trial head ball.  The right leg was brought over and up and with traction and internal rotation reduced in the pelvis.  Assessing it radiographically and clinically we felt like we only needed a little more offset.  We dislocated the hip remove the trial components.  We placed the real Actis formalin with high offset size 3 mm with a 36+1.5 metal head ball.  Again this reduced the pelvis and replaced with offset as well as range of motion stability and leg length assessing these radiographically and clinically.  Soft tissue was then irrigated normal saline solution.  Remnants of the joint capsule were closed with interrupted #1 Ethibond suture followed by a #1 Vicryl to close the tensor fascia.  0 Vicryl was used to close the deep tissue and 2-0 Vicryl was used to close subcutaneous tissue.  The skin was closed staples.  Aquacel dressing was applied.  Patient was taken off the Hana table and taken recovery room.  Tory Gaskins, PA-C did assist in the interrogation beginning and his assistance was crucial and medically necessary for soft tissue management and retraction, helping out implant placement and a layered closure of the wound.

## 2024-09-14 NOTE — Anesthesia Preprocedure Evaluation (Addendum)
 Anesthesia Evaluation  Patient identified by MRN, date of birth, ID band Patient awake    Reviewed: Allergy & Precautions, NPO status , Patient's Chart, lab work & pertinent test results  History of Anesthesia Complications (+) PONV  Airway Mallampati: II  TM Distance: >3 FB Neck ROM: Full    Dental  (+) Dental Advisory Given   Pulmonary COPD,  COPD inhaler, former smoker H/o lung cancer   breath sounds clear to auscultation       Cardiovascular hypertension, Pt. on medications (-) angina + DOE   Rhythm:Regular Rate:Normal  02/2024 stress:   The study is normal. There are no perfusion defects. The study is low risk.   No ST deviation was noted.   LV perfusion is normal.   Left ventricular function is normal. Nuclear stress EF: 70%.   02/2024 ECHO: EF 55 to 60%.  1. The LV has normal function, no regional wall motion abnormalities. There is mild LVH. Grade I diastolic dysfunction (impaired relaxation). Elevated left atrial pressure.   2. RVF is normal. The right ventricular size is normal.   3. Left atrial size was mildly dilated.   4. Right atrial size was mildly dilated.   5. The mitral valve is normal in structure. No evidence of mitral valve regurgitation. No evidence of mitral stenosis.   6. The aortic valve is tricuspid. Aortic valve regurgitation is not visualized. No aortic stenosis is present.       Neuro/Psych   Anxiety Depression    negative neurological ROS     GI/Hepatic Neg liver ROS,GERD  Medicated and Controlled,,  Endo/Other  diabetes (diet controlled, glu 99)    Renal/GU Renal InsufficiencyRenal disease     Musculoskeletal   Abdominal   Peds  Hematology Hb 11.1, plt 248k   Anesthesia Other Findings   Reproductive/Obstetrics                              Anesthesia Physical Anesthesia Plan  ASA: 3  Anesthesia Plan: Spinal and MAC   Post-op Pain Management:  Tylenol  PO (pre-op)*   Induction:   PONV Risk Score and Plan: 3 and Treatment may vary due to age or medical condition  Airway Management Planned: Natural Airway and Simple Face Mask  Additional Equipment: None  Intra-op Plan:   Post-operative Plan:   Informed Consent: I have reviewed the patients History and Physical, chart, labs and discussed the procedure including the risks, benefits and alternatives for the proposed anesthesia with the patient or authorized representative who has indicated his/her understanding and acceptance.     Dental advisory given  Plan Discussed with: CRNA and Surgeon  Anesthesia Plan Comments:          Anesthesia Quick Evaluation

## 2024-09-15 DIAGNOSIS — D631 Anemia in chronic kidney disease: Secondary | ICD-10-CM | POA: Diagnosis present

## 2024-09-15 DIAGNOSIS — N1831 Chronic kidney disease, stage 3a: Secondary | ICD-10-CM | POA: Diagnosis present

## 2024-09-15 DIAGNOSIS — E1169 Type 2 diabetes mellitus with other specified complication: Secondary | ICD-10-CM | POA: Diagnosis present

## 2024-09-15 DIAGNOSIS — Z860101 Personal history of adenomatous and serrated colon polyps: Secondary | ICD-10-CM | POA: Diagnosis not present

## 2024-09-15 DIAGNOSIS — J449 Chronic obstructive pulmonary disease, unspecified: Secondary | ICD-10-CM | POA: Diagnosis present

## 2024-09-15 DIAGNOSIS — Z833 Family history of diabetes mellitus: Secondary | ICD-10-CM | POA: Diagnosis not present

## 2024-09-15 DIAGNOSIS — Z8419 Family history of other disorders of kidney and ureter: Secondary | ICD-10-CM | POA: Diagnosis not present

## 2024-09-15 DIAGNOSIS — E1122 Type 2 diabetes mellitus with diabetic chronic kidney disease: Secondary | ICD-10-CM | POA: Diagnosis present

## 2024-09-15 DIAGNOSIS — Z79899 Other long term (current) drug therapy: Secondary | ICD-10-CM | POA: Diagnosis not present

## 2024-09-15 DIAGNOSIS — F411 Generalized anxiety disorder: Secondary | ICD-10-CM | POA: Diagnosis present

## 2024-09-15 DIAGNOSIS — Z83719 Family history of colon polyps, unspecified: Secondary | ICD-10-CM | POA: Diagnosis not present

## 2024-09-15 DIAGNOSIS — E7800 Pure hypercholesterolemia, unspecified: Secondary | ICD-10-CM | POA: Diagnosis present

## 2024-09-15 DIAGNOSIS — Z87891 Personal history of nicotine dependence: Secondary | ICD-10-CM | POA: Diagnosis not present

## 2024-09-15 DIAGNOSIS — I5032 Chronic diastolic (congestive) heart failure: Secondary | ICD-10-CM | POA: Diagnosis present

## 2024-09-15 DIAGNOSIS — M1611 Unilateral primary osteoarthritis, right hip: Secondary | ICD-10-CM | POA: Diagnosis present

## 2024-09-15 DIAGNOSIS — Z7982 Long term (current) use of aspirin: Secondary | ICD-10-CM | POA: Diagnosis not present

## 2024-09-15 DIAGNOSIS — Z90710 Acquired absence of both cervix and uterus: Secondary | ICD-10-CM | POA: Diagnosis not present

## 2024-09-15 DIAGNOSIS — Z85118 Personal history of other malignant neoplasm of bronchus and lung: Secondary | ICD-10-CM | POA: Diagnosis not present

## 2024-09-15 DIAGNOSIS — Z9181 History of falling: Secondary | ICD-10-CM | POA: Diagnosis not present

## 2024-09-15 DIAGNOSIS — Z888 Allergy status to other drugs, medicaments and biological substances status: Secondary | ICD-10-CM | POA: Diagnosis not present

## 2024-09-15 DIAGNOSIS — Z8249 Family history of ischemic heart disease and other diseases of the circulatory system: Secondary | ICD-10-CM | POA: Diagnosis not present

## 2024-09-15 DIAGNOSIS — Z8616 Personal history of COVID-19: Secondary | ICD-10-CM | POA: Diagnosis not present

## 2024-09-15 DIAGNOSIS — Z8261 Family history of arthritis: Secondary | ICD-10-CM | POA: Diagnosis not present

## 2024-09-15 DIAGNOSIS — I130 Hypertensive heart and chronic kidney disease with heart failure and stage 1 through stage 4 chronic kidney disease, or unspecified chronic kidney disease: Secondary | ICD-10-CM | POA: Diagnosis present

## 2024-09-15 LAB — BASIC METABOLIC PANEL WITH GFR
Anion gap: 15 (ref 5–15)
BUN: 23 mg/dL (ref 8–23)
CO2: 27 mmol/L (ref 22–32)
Calcium: 8.5 mg/dL — ABNORMAL LOW (ref 8.9–10.3)
Chloride: 94 mmol/L — ABNORMAL LOW (ref 98–111)
Creatinine, Ser: 1.18 mg/dL — ABNORMAL HIGH (ref 0.44–1.00)
GFR, Estimated: 48 mL/min — ABNORMAL LOW (ref >60)
Glucose, Bld: 127 mg/dL — ABNORMAL HIGH (ref 70–99)
Potassium: 3.1 mmol/L — ABNORMAL LOW (ref 3.5–5.1)
Sodium: 136 mmol/L (ref 135–145)

## 2024-09-15 MED ORDER — METHOCARBAMOL 1000 MG/10ML IJ SOLN: 500.0000 mg | Freq: Four times a day (QID) | INTRAMUSCULAR | Status: DC | PRN

## 2024-09-15 MED ORDER — PANTOPRAZOLE SODIUM 40 MG PO TBEC
40.0000 mg | DELAYED_RELEASE_TABLET | Freq: Every day | ORAL | Status: DC
  Administered 2024-09-15 – 2024-09-16 (×2): 40 mg via ORAL
  Filled 2024-09-15 (×2): qty 1

## 2024-09-15 MED ORDER — METHOCARBAMOL 500 MG PO TABS
500.0000 mg | ORAL_TABLET | Freq: Four times a day (QID) | ORAL | Status: DC | PRN
  Administered 2024-09-15 – 2024-09-16 (×3): 500 mg via ORAL
  Filled 2024-09-15 (×3): qty 1

## 2024-09-15 MED ORDER — FLUOXETINE HCL 20 MG PO CAPS
40.0000 mg | ORAL_CAPSULE | Freq: Every day | ORAL | Status: DC
  Administered 2024-09-15 – 2024-09-16 (×2): 40 mg via ORAL
  Filled 2024-09-15 (×2): qty 2

## 2024-09-15 MED ORDER — ADULT MULTIVITAMIN W/MINERALS CH
1.0000 | ORAL_TABLET | Freq: Every day | ORAL | Status: DC
  Administered 2024-09-15 – 2024-09-16 (×2): 1 via ORAL
  Filled 2024-09-15 (×2): qty 1

## 2024-09-15 MED ORDER — HYDROCHLOROTHIAZIDE 12.5 MG PO TABS
12.5000 mg | ORAL_TABLET | Freq: Every day | ORAL | Status: DC
  Administered 2024-09-15 – 2024-09-16 (×2): 12.5 mg via ORAL
  Filled 2024-09-15 (×2): qty 1

## 2024-09-15 MED ORDER — BUSPIRONE HCL 10 MG PO TABS
30.0000 mg | ORAL_TABLET | Freq: Two times a day (BID) | ORAL | Status: DC
  Administered 2024-09-15 – 2024-09-16 (×3): 30 mg via ORAL
  Filled 2024-09-15 (×3): qty 3

## 2024-09-15 MED ORDER — GABAPENTIN 300 MG PO CAPS
300.0000 mg | ORAL_CAPSULE | Freq: Two times a day (BID) | ORAL | Status: DC
  Administered 2024-09-15 – 2024-09-16 (×3): 300 mg via ORAL
  Filled 2024-09-15 (×3): qty 1

## 2024-09-15 MED ORDER — OXYCODONE HCL 5 MG PO TABS: 5.0000 mg | ORAL_TABLET | ORAL | Status: DC | PRN

## 2024-09-15 MED ORDER — ALBUTEROL SULFATE HFA 108 (90 BASE) MCG/ACT IN AERS: 2.0000 | INHALATION_SPRAY | Freq: Four times a day (QID) | RESPIRATORY_TRACT | Status: DC | PRN

## 2024-09-15 MED ORDER — DIPHENHYDRAMINE HCL 12.5 MG/5ML PO ELIX: 12.5000 mg | ORAL_SOLUTION | ORAL | Status: DC | PRN

## 2024-09-15 MED ORDER — SODIUM CHLORIDE 0.9 % IV SOLN: INTRAVENOUS | Status: DC

## 2024-09-15 MED ORDER — AMLODIPINE BESYLATE 10 MG PO TABS
10.0000 mg | ORAL_TABLET | Freq: Every day | ORAL | Status: DC
Start: 2024-09-15 — End: 2024-09-16
  Administered 2024-09-16: 10 mg via ORAL
  Filled 2024-09-15 (×2): qty 1

## 2024-09-15 MED ORDER — PHENOL 1.4 % MT LIQD: 1.0000 | OROMUCOSAL | Status: DC | PRN

## 2024-09-15 MED ORDER — CHLORTHALIDONE 25 MG PO TABS
25.0000 mg | ORAL_TABLET | Freq: Every day | ORAL | Status: DC
Start: 2024-09-15 — End: 2024-09-16
  Administered 2024-09-16: 25 mg via ORAL
  Filled 2024-09-15 (×3): qty 1

## 2024-09-15 MED ORDER — OLMESARTAN MEDOXOMIL-HCTZ 20-12.5 MG PO TABS: 1.0000 | ORAL_TABLET | Freq: Every day | ORAL | Status: DC

## 2024-09-15 MED ORDER — ASPIRIN 81 MG PO CHEW
81.0000 mg | CHEWABLE_TABLET | Freq: Two times a day (BID) | ORAL | Status: DC
  Administered 2024-09-15 – 2024-09-16 (×3): 81 mg via ORAL
  Filled 2024-09-15 (×3): qty 1

## 2024-09-15 MED ORDER — METOCLOPRAMIDE HCL 5 MG PO TABS: 5.0000 mg | ORAL_TABLET | Freq: Three times a day (TID) | ORAL | Status: DC | PRN

## 2024-09-15 MED ORDER — DOCUSATE SODIUM 100 MG PO CAPS
100.0000 mg | ORAL_CAPSULE | Freq: Two times a day (BID) | ORAL | Status: DC
Start: 2024-09-15 — End: 2024-09-16
  Administered 2024-09-15 – 2024-09-16 (×2): 100 mg via ORAL
  Filled 2024-09-15 (×2): qty 1

## 2024-09-15 MED ORDER — POLYETHYLENE GLYCOL 3350 17 G PO PACK: 17.0000 g | PACK | Freq: Every day | ORAL | Status: DC | PRN

## 2024-09-15 MED ORDER — MENTHOL 3 MG MT LOZG: 1.0000 | LOZENGE | OROMUCOSAL | Status: DC | PRN

## 2024-09-15 MED ORDER — ONDANSETRON HCL 4 MG PO TABS: 4.0000 mg | ORAL_TABLET | Freq: Four times a day (QID) | ORAL | Status: DC | PRN

## 2024-09-15 MED ORDER — FERROUS GLUCONATE 324 (38 FE) MG PO TABS
324.0000 mg | ORAL_TABLET | Freq: Every day | ORAL | Status: DC
  Administered 2024-09-16: 324 mg via ORAL
  Filled 2024-09-15: qty 1

## 2024-09-15 MED ORDER — MELATONIN 3 MG PO TABS: 3.0000 mg | ORAL_TABLET | Freq: Every evening | ORAL | Status: DC | PRN

## 2024-09-15 MED ORDER — BUPROPION HCL ER (XL) 150 MG PO TB24
150.0000 mg | ORAL_TABLET | Freq: Every day | ORAL | Status: DC
  Administered 2024-09-15 – 2024-09-16 (×2): 150 mg via ORAL
  Filled 2024-09-15 (×2): qty 1

## 2024-09-15 MED ORDER — IRBESARTAN 150 MG PO TABS
150.0000 mg | ORAL_TABLET | Freq: Every day | ORAL | Status: DC
  Administered 2024-09-16: 150 mg via ORAL
  Filled 2024-09-15 (×3): qty 1

## 2024-09-15 MED ORDER — ACETAMINOPHEN 325 MG PO TABS: 325.0000 mg | ORAL_TABLET | Freq: Four times a day (QID) | ORAL | Status: DC | PRN

## 2024-09-15 MED ORDER — OXYCODONE HCL 5 MG PO TABS
10.0000 mg | ORAL_TABLET | ORAL | Status: DC | PRN
  Administered 2024-09-15: 15 mg via ORAL
  Administered 2024-09-15: 10 mg via ORAL
  Administered 2024-09-16 (×2): 15 mg via ORAL
  Filled 2024-09-15: qty 2
  Filled 2024-09-15 (×3): qty 3

## 2024-09-15 MED ORDER — CEFAZOLIN SODIUM-DEXTROSE 2-4 GM/100ML-% IV SOLN
2.0000 g | Freq: Four times a day (QID) | INTRAVENOUS | Status: AC
  Administered 2024-09-15 (×2): 2 g via INTRAVENOUS
  Filled 2024-09-15 (×2): qty 100

## 2024-09-15 MED ORDER — ONDANSETRON HCL 4 MG/2ML IJ SOLN: 4.0000 mg | Freq: Four times a day (QID) | INTRAMUSCULAR | Status: DC | PRN

## 2024-09-15 MED ORDER — METOCLOPRAMIDE HCL 5 MG/ML IJ SOLN: 5.0000 mg | Freq: Three times a day (TID) | INTRAMUSCULAR | Status: DC | PRN

## 2024-09-15 MED ORDER — ALUM & MAG HYDROXIDE-SIMETH 200-200-20 MG/5ML PO SUSP: 30.0000 mL | ORAL | Status: DC | PRN

## 2024-09-15 NOTE — Plan of Care (Signed)

## 2024-09-15 NOTE — Progress Notes (Signed)
 Subjective: 1 Day Post-Op Procedure(s) (LRB): RIGHT TOTAL HIP ARTHROPLASTY, ANTERIOR APPROACH (Right) Patient reports pain as moderate.    Objective: Vital signs in last 24 hours: Temp:  [97.9 F (36.6 C)-99.2 F (37.3 C)] 99.2 F (37.3 C) (10/29 0430) Pulse Rate:  [55-108] 108 (10/29 0430) Resp:  [9-20] 20 (10/29 0430) BP: (98-139)/(53-81) 118/59 (10/29 0430) SpO2:  [96 %-100 %] 96 % (10/29 0430) Weight:  [81.6 kg] 81.6 kg (10/28 0937)  Intake/Output from previous day: 10/28 0701 - 10/29 0700 In: 1000 [I.V.:1000] Out: 600 [Urine:350; Blood:250] Intake/Output this shift: No intake/output data recorded.  No results for input(s): HGB in the last 72 hours. No results for input(s): WBC, RBC, HCT, PLT in the last 72 hours. No results for input(s): NA, K, CL, CO2, BUN, CREATININE, GLUCOSE, CALCIUM  in the last 72 hours. No results for input(s): LABPT, INR in the last 72 hours.  Sensation intact distally Intact pulses distally Dorsiflexion/Plantar flexion intact Incision: dressing C/D/I   Assessment/Plan: 1 Day Post-Op Procedure(s) (LRB): RIGHT TOTAL HIP ARTHROPLASTY, ANTERIOR APPROACH (Right) Up with therapy      Lonni CINDERELLA Poli 09/15/2024, 7:50 AM

## 2024-09-15 NOTE — Progress Notes (Signed)
 Physical Therapy Treatment Patient Details Name: Sophia [name] MRN: [id number] DOB: [birth year] Today's Date: 09/15/2024   History of Present Illness Pt is a 73 y.o. female who presented 10/28 for R direct anterior THA. PMHx: tobacco use, HTN, DM2, anemia, cancer, CKD, arthritis, anxiety, and depression.    PT Comments  Returned for second session to provide pt with THA HEP handout and review it. Pt performed >/= x10 reps for each exercise with her R LE if not bil lower extremities. She demonstrated difficulty primarily with R hip flexion and extension strength and ROM. She only needed CGA come transfer to stand this session. Will continue to follow acutely.    If plan is discharge home, recommend the following: A little help with walking and/or transfers;A little help with bathing/dressing/bathroom;Assistance with cooking/housework;Assist for transportation;Help with stairs or ramp for entrance   Can travel by private vehicle        Equipment Recommendations  None recommended by PT    Recommendations for Other Services OT consult     Precautions / Restrictions Precautions Precautions: Fall Restrictions Weight Bearing Restrictions Per Provider Order: Yes RLE Weight Bearing Per Provider Order: Weight bearing as tolerated     Mobility  Bed Mobility Overal bed mobility: Needs Assistance Bed Mobility: Supine to Sit     Supine to sit: Min assist, HOB elevated, Used rails     General bed mobility comments: Pt sitting in chair upon arrival and at end of session    Transfers Overall transfer level: Needs assistance Equipment used: Rolling walker (2 wheels) Transfers: Sit to/from Stand Sit to Stand: Contact guard assist           General transfer comment: Pt able to stand from chair with CGA for safety. Needed cues to back up to chair prior to reaching to sit down    Ambulation/Gait Ambulation/Gait assistance: Contact guard assist Gait Distance (Feet): 3 Feet Assistive  device: Rolling walker (2 wheels) Gait Pattern/deviations: Step-to pattern, Decreased step length - right, Decreased stride length, Decreased weight shift to right, Trunk flexed, Antalgic Gait velocity: reduced Gait velocity interpretation: <1.31 ft/sec, indicative of household ambulator   General Gait Details: Pt ambulates slowly with an antalgic, step-to gait pattern. No LOB, CGA for safety. Focused session on exercises rather than gait this time   Stairs             Wheelchair Mobility     Tilt Bed    Modified Rankin (Stroke Patients Only)       Balance Overall balance assessment: Needs assistance Sitting-balance support: No upper extremity supported, Feet supported Sitting balance-Leahy Scale: Good     Standing balance support: Bilateral upper extremity supported, During functional activity, Single extremity supported Standing balance-Leahy Scale: Poor Standing balance comment: reliant on UE support                            Communication Communication Communication: No apparent difficulties  Cognition Arousal: Alert Behavior During Therapy: WFL for tasks assessed/performed   PT - Cognitive impairments: No apparent impairments                         Following commands: Intact      Cueing Cueing Techniques: Verbal cues  Exercises Total Joint Exercises Ankle Circles/Pumps: AROM, Both, 15 reps, Seated (reclined with legs elevated) Quad Sets: AROM, Strengthening, Both, 10 reps, Seated (reclined with legs elevated) Short Arc Quad: AROM, Right,  10 reps, Seated (reclined with legs elevated) Heel Slides: AAROM, Right, 10 reps, Seated (reclined with legs elevated) Hip ABduction/ADduction: AROM, AAROM, Strengthening, Both, 10 reps, Right, Seated, Standing (reclined with legs elevated, AAROM on R, AROM on L, 10x bil; x10 AROM on R only standing with RW) Long Arc Quad: AROM, Strengthening, Both, 10 reps, Seated Knee Flexion: AROM,  Strengthening, Both, 10 reps, Standing (with RW) Marching in Standing: AROM, Strengthening, Right, 10 reps, Standing (with RW) Standing Hip Extension: AROM, Strengthening, 10 reps, Right, Standing (with RW)    General Comments General comments (skin integrity, edema, etc.): educated pt to perform AROM of R hip and elevate and ice it      Pertinent Vitals/Pain Pain Assessment Pain Assessment: Faces Faces Pain Scale: Hurts even more Pain Location: R hip Pain Descriptors / Indicators: Discomfort, Grimacing, Guarding, Operative site guarding, Sore, Moaning Pain Intervention(s): Limited activity within patient's tolerance, Monitored during session, Repositioned, Ice applied    Home Living Family/patient expects to be discharged to:: Private residence Living Arrangements: Children (son) Available Help at Discharge: Family;Available 24 hours/day Type of Home: House Home Access: Ramped entrance       Home Layout: Two level;Able to live on main level with bedroom/bathroom Home Equipment: Grab bars - tub/shower;Shower seat;BSC/3in1;Rolling Environmental Consultant (2 wheels);Wheelchair Financial Trader (4 wheels)      Prior Function            PT Goals (current goals can now be found in the care plan section) Acute Rehab PT Goals Patient Stated Goal: to reduce pain PT Goal Formulation: With patient Time For Goal Achievement: 09/29/24 Potential to Achieve Goals: Good Progress towards PT goals: Progressing toward goals    Frequency    7X/week      PT Plan      Co-evaluation              AM-PAC PT 6 Clicks Mobility   Outcome Measure  Help needed turning from your back to your side while in a flat bed without using bedrails?: A Little Help needed moving from lying on your back to sitting on the side of a flat bed without using bedrails?: A Little Help needed moving to and from a bed to a chair (including a wheelchair)?: A Little Help needed standing up from a chair using your arms  (e.g., wheelchair or bedside chair)?: A Little Help needed to walk in hospital room?: Total (<20 ft) Help needed climbing 3-5 steps with a railing? : Total 6 Click Score: 14    End of Session Equipment Utilized During Treatment: Gait belt Activity Tolerance: Patient tolerated treatment well Patient left: in chair;with call bell/phone within reach;with chair alarm set Nurse Communication: Mobility status;Other (comment) (requesting bath; no chair alarm cord from box to wall) PT Visit Diagnosis: Unsteadiness on feet (R26.81);Other abnormalities of gait and mobility (R26.89);Muscle weakness (generalized) (M62.81);Difficulty in walking, not elsewhere classified (R26.2);Pain Pain - Right/Left: Right Pain - part of body: Hip     Time: 1721-1740 PT Time Calculation (min) (ACUTE ONLY): 19 min  Charges:    $Therapeutic Exercise: 8-22 mins PT General Charges $$ ACUTE PT VISIT: 1 Visit                     Theo Ferretti, PT, DPT Acute Rehabilitation Services  Office: [phone]    Theo CHRISTELLA Ferretti 09/15/2024, 5:46 PM

## 2024-09-15 NOTE — Evaluation (Signed)
 Physical Therapy Evaluation Patient Details Name: Sophia [name]: [id number] DOB: 1951-06-05 Today's Date: 09/15/2024  History of Present Illness  Pt is a 73 y.o. female who presented 10/28 for R direct anterior THA. PMHx: tobacco use, HTN, DM2, anemia, cancer, CKD, arthritis, anxiety, and depression.  Clinical Impression  Pt presents with condition above and deficits mentioned below, see PT Problem List. Prior to her hip pain worsening in the past month, pt was independent without DME. She lives with her son in a 2-level house with a ramped entry. She does not need to go upstairs. Currently, pt is demonstrating deficits in R hip strength and ROM due to pain expected after THA. She is currently displaying deficits in balance and activity tolerance as well due to the pain. She is currently requiring min-modA for transfers, minA for bed mobility, and CGA to ambulate very slowly within the room using a RW for support. She will likely progress well as her pain improves. Follow physician's recommendations for discharge plan and follow up therapies. Will continue to follow acutely.      If plan is discharge home, recommend the following: A little help with walking and/or transfers;A little help with bathing/dressing/bathroom;Assistance with cooking/housework;Assist for transportation;Help with stairs or ramp for entrance   Can travel by private vehicle        Equipment Recommendations None recommended by PT  Recommendations for Other Services  OT consult    Functional Status Assessment Patient has had a recent decline in their functional status and demonstrates the ability to make significant improvements in function in a reasonable and predictable amount of time.     Precautions / Restrictions Precautions Precautions: Fall Restrictions Weight Bearing Restrictions Per Provider Order: Yes RLE Weight Bearing Per Provider Order: Weight bearing as tolerated      Mobility  Bed  Mobility Overal bed mobility: Needs Assistance Bed Mobility: Supine to Sit     Supine to sit: Min assist, HOB elevated, Used rails     General bed mobility comments: MinA needed to manage R leg out of L EOB to sit up    Transfers Overall transfer level: Needs assistance Equipment used: Rolling walker (2 wheels) Transfers: Sit to/from Stand Sit to Stand: Min assist, Mod assist           General transfer comment: Pt stood from elevated EOB with minA the first rep. Pt needed modA to stand from low toilet the second rep. Cues needed for hand placement during transfers, using the grab bar on the second rep    Ambulation/Gait Ambulation/Gait assistance: Contact guard assist Gait Distance (Feet): 14 Feet (x2 bouts of ~14 ft each bout) Assistive device: Rolling walker (2 wheels) Gait Pattern/deviations: Step-to pattern, Decreased step length - right, Decreased stride length, Decreased weight shift to right, Trunk flexed, Antalgic Gait velocity: reduced Gait velocity interpretation: <1.31 ft/sec, indicative of household ambulator   General Gait Details: Pt ambulates slowly with an antalgic, step-to gait pattern. Cues provided to remain within RW and offload R leg as needed by pushing down on RW with arms to manage the pain during R stance phase. No LOB, CGA for safety  Stairs            Wheelchair Mobility     Tilt Bed    Modified Rankin (Stroke Patients Only)       Balance Overall balance assessment: Needs assistance Sitting-balance support: No upper extremity supported, Feet supported Sitting balance-Leahy Scale: Good     Standing balance support: Bilateral  upper extremity supported, During functional activity, Single extremity supported Standing balance-Leahy Scale: Poor Standing balance comment: reliant on UE support                             Pertinent Vitals/Pain Pain Assessment Pain Assessment: Faces Faces Pain Scale: Hurts even more Pain  Location: R hip Pain Descriptors / Indicators: Discomfort, Grimacing, Guarding, Operative site guarding, Sore, Moaning Pain Intervention(s): Limited activity within patient's tolerance, Monitored during session, Premedicated before session, Repositioned, Ice applied    Home Living Family/patient expects to be discharged to:: Private residence Living Arrangements: Children (son) Available Help at Discharge: Family;Available 24 hours/day Type of Home: House Home Access: Ramped entrance       Home Layout: Two level;Able to live on main level with bedroom/bathroom Home Equipment: Grab bars - tub/shower;Shower seat;BSC/3in1;Rolling Environmental Consultant (2 wheels);Wheelchair Financial Trader (4 wheels)      Prior Function Prior Level of Function : Independent/Modified Independent;Driving             Mobility Comments: No AD until R hip pain began to get bad ~1 month ago then began to use RW       Extremity/Trunk Assessment   Upper Extremity Assessment Upper Extremity Assessment: Overall WFL for tasks assessed    Lower Extremity Assessment Lower Extremity Assessment: RLE deficits/detail RLE Deficits / Details: s/p R THA anterior approach; limit R hip flexion AAROM to ~30'; limited R hip ROM in general due to pain; decreased sensation at thigh    Cervical / Trunk Assessment Cervical / Trunk Assessment: Normal  Communication   Communication Communication: No apparent difficulties    Cognition Arousal: Alert Behavior During Therapy: WFL for tasks assessed/performed   PT - Cognitive impairments: No apparent impairments                         Following commands: Intact       Cueing Cueing Techniques: Verbal cues     General Comments General comments (skin integrity, edema, etc.): educated pt to perform AROM of R hip and elevate and ice it    Exercises     Assessment/Plan    PT Assessment Patient needs continued PT services  PT Problem List Decreased  strength;Decreased range of motion;Decreased activity tolerance;Decreased balance;Decreased mobility;Pain       PT Treatment Interventions DME instruction;Gait training;Stair training;Functional mobility training;Therapeutic activities;Therapeutic exercise;Balance training;Neuromuscular re-education;Patient/family education    PT Goals (Current goals can be found in the Care Plan section)  Acute Rehab PT Goals Patient Stated Goal: to reduce pain PT Goal Formulation: With patient Time For Goal Achievement: 09/29/24 Potential to Achieve Goals: Good    Frequency 7X/week     Co-evaluation               AM-PAC PT 6 Clicks Mobility  Outcome Measure Help needed turning from your back to your side while in a flat bed without using bedrails?: A Little Help needed moving from lying on your back to sitting on the side of a flat bed without using bedrails?: A Little Help needed moving to and from a bed to a chair (including a wheelchair)?: A Little Help needed standing up from a chair using your arms (e.g., wheelchair or bedside chair)?: A Little Help needed to walk in hospital room?: Total (<20 ft) Help needed climbing 3-5 steps with a railing? : Total 6 Click Score: 14    End of Session  Equipment Utilized During Treatment: Gait belt Activity Tolerance: Patient tolerated treatment well Patient left: in chair;with call bell/phone within reach;with chair alarm set;with family/visitor present Nurse Communication: Mobility status;Other (comment) (requesting bath; no chair alarm cord from box to wall) PT Visit Diagnosis: Unsteadiness on feet (R26.81);Other abnormalities of gait and mobility (R26.89);Muscle weakness (generalized) (M62.81);Difficulty in walking, not elsewhere classified (R26.2);Pain Pain - Right/Left: Right Pain - part of body: Hip    Time: 8398-8360 PT Time Calculation (min) (ACUTE ONLY): 38 min   Charges:   PT Evaluation $PT Eval Low Complexity: 1 Low PT  Treatments $Gait Training: 8-22 mins $Therapeutic Activity: 8-22 mins PT General Charges $$ ACUTE PT VISIT: 1 Visit         Theo Ferretti, PT, DPT Acute Rehabilitation Services  Office: [phone]   Theo CHRISTELLA Ferretti 09/15/2024, 4:53 PM

## 2024-09-15 NOTE — Progress Notes (Addendum)
 Transition of Care [facility]) - Inpatient Brief Assessment   Patient Details  Name: Sophia [name] MRN: [id number] Date of Birth: 27-Aug-1951  [facility] CM/SW Contact:    Rosaline JONELLE Joe, RN Phone Number: 09/15/2024, 10:48 AM   Clinical Narrative: Patient admitted to the hospital from home  - S/P right hip arthroplasty.  Patient is pending PT/OT evaluation.  Patient was crying when I entered the room and is requesting pain medication - rating pain 10/10.  Bedside nurse and charge RN notified and charge RN plans to provide her with medication.  DME at the home includes Rolator, WC and RW.  Patient states that she lives with her son at the home and hopes that once her pain is controlled that she should be able to return home with home health if able.  PT/OT pending at this time.  CM will follow up with the patient post-evaluation by therapy.  09/15/24 1442 - Patient is waiting on PT/OT but patient states that she is now comfortable and plans to return home when stable with home health services.  Patient has DME at the home including bedside commode, rolator, RW, WC.  Patient was offered Medicare choice regarding home health and patient states that she receives [facility] thru [place] recently and was pleased with services.  [facility] called and accepted for [facility] PT.  Patient plans to have niece,son provide transportation to home by car when stable.  Transition of Care Asessment: Insurance and Status: Insurance coverage has been reviewed Patient has primary care physician: Yes Home environment has been reviewed: from home with son Prior level of function:: self Prior/Current Home Services: No current home services (DME at the home includes Rolator, WC, RW) Social Drivers of Health Review: SDOH reviewed needs interventions Readmission risk has been reviewed: Yes Transition of care needs: transition of care needs identified, TOC will continue to follow

## 2024-09-15 NOTE — Care Management Obs Status (Signed)
 MEDICARE OBSERVATION STATUS NOTIFICATION   Patient Details  Name: Sophia [name] MRN: [id number] Date of Birth: Jan 03, 1951   Medicare Observation Status Notification Given:  Yes  Obs notice signed and copy given  Claretta Deed 09/15/2024, 1:46 PM

## 2024-09-16 ENCOUNTER — Encounter (HOSPITAL_COMMUNITY): Payer: Self-pay | Admitting: Orthopaedic Surgery

## 2024-09-16 LAB — CBC
HCT: 25.8 % — ABNORMAL LOW (ref 36.0–46.0)
Hemoglobin: 8.2 g/dL — ABNORMAL LOW (ref 12.0–15.0)
MCH: 25.5 pg — ABNORMAL LOW (ref 26.0–34.0)
MCHC: 31.8 g/dL (ref 30.0–36.0)
MCV: 80.1 fL (ref 80.0–100.0)
Platelets: 162 K/uL (ref 150–400)
RBC: 3.22 MIL/uL — ABNORMAL LOW (ref 3.87–5.11)
RDW: 17.3 % — ABNORMAL HIGH (ref 11.5–15.5)
WBC: 7.1 K/uL (ref 4.0–10.5)
nRBC: 0 % (ref 0.0–0.2)

## 2024-09-16 MED ORDER — ASPIRIN 81 MG PO CHEW: 81.0000 mg | CHEWABLE_TABLET | Freq: Two times a day (BID) | ORAL | 0 refills | Status: AC

## 2024-09-16 MED ORDER — OXYCODONE HCL 5 MG PO TABS: 5.0000 mg | ORAL_TABLET | ORAL | 0 refills | Status: AC | PRN

## 2024-09-16 MED ORDER — METHOCARBAMOL 500 MG PO TABS: 500.0000 mg | ORAL_TABLET | Freq: Four times a day (QID) | ORAL | 1 refills | Status: AC | PRN

## 2024-09-16 NOTE — Discharge Summary (Signed)
 Patient ID: Sophia [name] MRN: [id number] DOB/AGE: [birth year] 73 y.o.  Admit date: 09/14/2024 Discharge date: 09/16/2024  Admission Diagnoses:  Principal Problem:   Unilateral primary osteoarthritis, right hip Active Problems:   Status post total replacement of right hip   Discharge Diagnoses:  Same  Past Medical History:  Diagnosis Date   Acute respiratory disease due to COVID-19 virus 08/28/2019   Anemia    Iron  Deficiency   Anxiety    Arthritis    Cancer (HCC) 02/2024   Lung Cancer   Chronic kidney disease    Depression    Diabetes mellitus type 2 in obese 11/28/2010   Qualifier: Diagnosis of  By: Antonetta MD, Margaret  Diet controlled in 12/2016    Diabetes mellitus without complication (HCC)    Hypercholesteremia    Hypertension    PONV (postoperative nausea and vomiting)    Septic shock (HCC) 06/13/2024    Surgeries: Procedure(s): RIGHT TOTAL HIP ARTHROPLASTY, ANTERIOR APPROACH on 09/14/2024   Consultants:   Discharged Condition: Improved  Hospital Course: Sophia [name] is an 73 y.o. female who was admitted 09/14/2024 for operative treatment ofUnilateral primary osteoarthritis, right hip. Patient has severe unremitting pain that affects sleep, daily activities, and work/hobbies. After pre-op clearance the patient was taken to the operating room on 09/14/2024 and underwent  Procedure(s): RIGHT TOTAL HIP ARTHROPLASTY, ANTERIOR APPROACH.    Patient was given perioperative antibiotics:  Anti-infectives (From admission, onward)    Start     Dose/Rate Route Frequency Ordered Stop   09/15/24 1057  ceFAZolin  (ANCEF ) IVPB 2g/100 mL premix        2 g 200 mL/hr over 30 Minutes Intravenous Every 6 hours 09/15/24 1057 09/15/24 1735   09/14/24 1000  ceFAZolin  (ANCEF ) IVPB 2g/100 mL premix        2 g 200 mL/hr over 30 Minutes Intravenous On call to O.R. 09/14/24 9051 09/14/24 1232        Patient was given sequential compression devices, early ambulation, and  chemoprophylaxis to prevent DVT.  Inpatient Morphine  Milligram Equivalents Per Day 10/28 - 10/30   Values displayed are in units of MME/Day    Order Start / End Date 10/28 Yesterday Today    oxyCODONE  (Oxy IR/ROXICODONE ) immediate release tablet 5 mg 10/28 - 10/28 7.5 of Unknown -- --    oxyCODONE  (ROXICODONE ) 5 MG/5ML solution 5 mg 10/28 - 10/28 0 of Unknown -- --      Group total: 7.5 of Unknown      HYDROmorphone  (DILAUDID ) injection 0.25-0.5 mg 10/28 - 10/28 20 of 40-80 -- --    fentaNYL  (SUBLIMAZE ) injection 10/28 - 10/28 *30 of 30 -- --    HYDROmorphone  (DILAUDID ) injection 0.5-1 mg 10/28 - No end date 40 of 20-40 80 of 60-120 0 of 60-120    oxyCODONE  (Oxy IR/ROXICODONE ) immediate release tablet 5-10 mg 10/29 - 10/29 -- 0 of 7.5-15 --    oxyCODONE  (Oxy IR/ROXICODONE ) immediate release tablet 10-15 mg 10/29 - No end date -- 37.5 of 60-90 45 of 90-135    oxyCODONE  (Oxy IR/ROXICODONE ) immediate release tablet 5 mg 10/29 - No end date -- 0 of 30 0 of 45    Daily Totals  * 97.5 of Unknown (at least 90-150) 117.5 of 157.5-255 45 of 195-300  *One-Step medication  Calculation Errors     Order Type Date Details   oxyCODONE  (Oxy IR/ROXICODONE ) immediate release tablet 5 mg Ordered Dose -- Insufficient frequency information   oxyCODONE  (ROXICODONE ) 5 MG/5ML solution 5 mg  Ordered Dose -- Insufficient frequency information            Patient benefited maximally from hospital stay and there were no complications.    Recent vital signs: Patient Vitals for the past 24 hrs:  BP Temp Temp src Pulse Resp SpO2  09/16/24 0751 (!) 118/54 99.3 F (37.4 C) Oral 82 18 96 %  09/16/24 0513 (!) 111/56 99.5 F (37.5 C) Oral 76 20 93 %  09/16/24 0040 (!) 125/48 99.4 F (37.4 C) Oral 72 20 100 %  09/15/24 2001 122/61 99.1 F (37.3 C) Oral 82 20 97 %  09/15/24 1704 (!) 111/40 -- -- 74 18 99 %  09/15/24 1158 (!) 104/49 98.3 F (36.8 C) Oral 79 18 (!) 85 %     Recent laboratory studies:  Recent Labs     09/15/24 1120 09/16/24 0351  WBC  --  7.1  HGB  --  8.2*  HCT  --  25.8*  PLT  --  162  NA 136  --   K 3.1*  --   CL 94*  --   CO2 27  --   BUN 23  --   CREATININE 1.18*  --   GLUCOSE 127*  --   CALCIUM  8.5*  --      Discharge Medications:   Allergies as of 09/16/2024       Reactions   Farxiga  [dapagliflozin ] Other (See Comments)   Caused UTI/admitted with sepsis   Jardiance [empagliflozin] Other (See Comments)   Caused UTI/admitted with sepsis        Medication List     STOP taking these medications    aspirin  EC 325 MG tablet Replaced by: aspirin  81 MG chewable tablet       TAKE these medications    acetaminophen  650 MG CR tablet Commonly known as: TYLENOL  Take 650 mg by mouth every 8 (eight) hours as needed for pain.   albuterol  108 (90 Base) MCG/ACT inhaler Commonly known as: VENTOLIN  HFA Inhale 2 puffs into the lungs every 6 (six) hours as needed for wheezing or shortness of breath.   amLODipine  10 MG tablet Commonly known as: NORVASC  Take 10 mg by mouth daily.   aspirin  81 MG chewable tablet Chew 1 tablet (81 mg total) by mouth 2 (two) times daily. Replaces: aspirin  EC 325 MG tablet   buPROPion  150 MG 24 hr tablet Commonly known as: Wellbutrin  XL Take 1 tablet (150 mg total) by mouth daily.   busPIRone  30 MG tablet Commonly known as: BUSPAR  Take one tablet by mouth two times daily   chlorthalidone  25 MG tablet Commonly known as: HYGROTON  Take 25 mg by mouth daily.   ferrous gluconate  324 MG tablet Commonly known as: FERGON Take 324 mg by mouth daily with breakfast.   FLUoxetine  40 MG capsule Commonly known as: PROZAC  Take 1 capsule (40 mg total) by mouth daily.   gabapentin  300 MG capsule Commonly known as: NEURONTIN  Take 1 capsule (300 mg total) by mouth 2 (two) times daily.   Melatonin 1 MG Chew Chew 2 mg by mouth at bedtime as needed (sleep).   methocarbamol  500 MG tablet Commonly known as: ROBAXIN  Take 1 tablet (500  mg total) by mouth every 6 (six) hours as needed for muscle spasms.   multivitamin tablet Take 1 tablet by mouth daily.   olmesartan -hydrochlorothiazide  20-12.5 MG tablet Commonly known as: BENICAR  HCT TAKE ONE TABLET BY MOUTH EVERY DAY   oxyCODONE  5 MG immediate release tablet Commonly  known as: Oxy IR/ROXICODONE  Take 1 tablet (5 mg total) by mouth every 4 (four) hours as needed for moderate pain (pain score 4-6) (pain score 4-6).   polyethylene glycol 17 g packet Commonly known as: MIRALAX  / GLYCOLAX  Take 17 g by mouth daily as needed for moderate constipation.   rosuvastatin  20 MG tablet Commonly known as: CRESTOR  TAKE 1 TABLET BY MOUTH EVERY DAY *NEW PRESCRIPTION REQUEST*   sucralfate  1 g tablet Commonly known as: CARAFATE  TAKE ONE (1) TABLET BY MOUTH TWICE DAILY *NEW PRESCRIPTION REQUEST*   traZODone  50 MG tablet Commonly known as: DESYREL  Take 0.5-1 tablets (25-50 mg total) by mouth at bedtime as needed for sleep.   VITAMIN B-12 PO Take 2 tablets by mouth daily.               Durable Medical Equipment  (From admission, onward)           Start     Ordered   09/15/24 1058  DME 3 n 1  Once        09/15/24 1057   09/15/24 1058  DME Walker rolling  Once       Question Answer Comment  Walker: With 5 Inch Wheels   Patient needs a walker to treat with the following condition Status post total replacement of right hip      09/15/24 1057            Diagnostic Studies: DG HIP UNILAT WITH PELVIS 1V RIGHT Result Date: 09/14/2024 CLINICAL DATA:  Elective surgery. EXAM: DG HIP (WITH OR WITHOUT PELVIS) 1V RIGHT COMPARISON:  None Available. FINDINGS: Three fluoroscopic spot views of the pelvis and right hip obtained in the operating room. Images during hip arthroplasty. Fluoroscopy time 14 seconds. Dose 1.7197 mGy. IMPRESSION: Intraoperative fluoroscopy during right hip arthroplasty. Electronically Signed   By: Andrea Gasman M.D.   On: 09/14/2024 15:07   DG  C-Arm 1-60 Min-No Report Result Date: 09/14/2024 Fluoroscopy was utilized by the requesting physician.  No radiographic interpretation.    Disposition: Discharge disposition: 06-Home-Health Care Svc          Follow-up Information     Vernetta Lonni GRADE, MD Follow up in 2 week(s).   Specialty: Orthopedic Surgery Contact information: [address] [place]  [place] [address] [phone]         Care, [facility] Follow up.   Specialty: Home Health Services Why: [facility] will provide home health services. Contact information: [address] [place] [address] [phone]                  Signed: BERTRUM GASKINS 09/16/2024, 10:31 AM

## 2024-09-16 NOTE — Progress Notes (Signed)
 Physical Therapy Treatment Patient Details Name: Sophia [name] MRN: [id number] DOB: 04-07-1951 Today's Date: 09/16/2024   History of Present Illness Pt is a 73 y.o. female who presented 10/28 for R direct anterior THA. PMHx: tobacco use, HTN, DM2, anemia, cancer, CKD, arthritis, anxiety, and depression.    PT Comments  The pt is making good functional progress, now ambulating an increased distance of ~90 ft with an antalgic step-through gait pattern. She continues to walk very slowly due to pain in her R hip, especially when cued to flex her R hip for better foot clearance and gait pattern during R swing phase. Will continue to follow acutely.    If plan is discharge home, recommend the following: A little help with walking and/or transfers;A little help with bathing/dressing/bathroom;Assistance with cooking/housework;Assist for transportation;Help with stairs or ramp for entrance   Can travel by private vehicle        Equipment Recommendations  None recommended by PT    Recommendations for Other Services       Precautions / Restrictions Precautions Precautions: Fall Restrictions Weight Bearing Restrictions Per Provider Order: Yes RLE Weight Bearing Per Provider Order: Weight bearing as tolerated     Mobility  Bed Mobility               General bed mobility comments: Pt sitting in chair upon arrival and at end of session    Transfers Overall transfer level: Needs assistance Equipment used: Rolling walker (2 wheels) Transfers: Sit to/from Stand Sit to Stand: Contact guard assist           General transfer comment: Extra time to power up to stand due to pain, no LOB, CGA for safety    Ambulation/Gait Ambulation/Gait assistance: Supervision Gait Distance (Feet): 90 Feet Assistive device: Rolling walker (2 wheels) Gait Pattern/deviations: Decreased step length - right, Decreased stride length, Decreased weight shift to right, Trunk flexed, Antalgic, Step-through  pattern Gait velocity: reduced Gait velocity interpretation: <1.31 ft/sec, indicative of household ambulator   General Gait Details: Pt ambulates slowly with an antalgic gait pattern, progressing to a step-through gait pattern with improved cadence this date. Cues provided to flex her R hip for better R foot clearance and direct advancement of R foot when stepping. no LOB, supervision for safety   Stairs             Wheelchair Mobility     Tilt Bed    Modified Rankin (Stroke Patients Only)       Balance Overall balance assessment: Needs assistance Sitting-balance support: No upper extremity supported, Feet supported Sitting balance-Leahy Scale: Good     Standing balance support: Bilateral upper extremity supported, During functional activity Standing balance-Leahy Scale: Poor Standing balance comment: reliant on UE support                            Communication Communication Communication: No apparent difficulties  Cognition Arousal: Alert Behavior During Therapy: WFL for tasks assessed/performed   PT - Cognitive impairments: No apparent impairments                         Following commands: Intact      Cueing Cueing Techniques: Verbal cues  Exercises      General Comments        Pertinent Vitals/Pain Pain Assessment Pain Assessment: 0-10 Pain Score: 9  Pain Location: R hip Pain Descriptors / Indicators: Discomfort, Grimacing, Guarding, Operative  site guarding, Sore, Moaning Pain Intervention(s): Limited activity within patient's tolerance, Monitored during session, Premedicated before session, Repositioned, Ice applied    Home Living                          Prior Function            PT Goals (current goals can now be found in the care plan section) Acute Rehab PT Goals Patient Stated Goal: to reduce pain PT Goal Formulation: With patient Time For Goal Achievement: 09/29/24 Potential to Achieve Goals:  Good Progress towards PT goals: Progressing toward goals    Frequency    7X/week      PT Plan      Co-evaluation              AM-PAC PT 6 Clicks Mobility   Outcome Measure  Help needed turning from your back to your side while in a flat bed without using bedrails?: A Little Help needed moving from lying on your back to sitting on the side of a flat bed without using bedrails?: A Little Help needed moving to and from a bed to a chair (including a wheelchair)?: A Little Help needed standing up from a chair using your arms (e.g., wheelchair or bedside chair)?: A Little Help needed to walk in hospital room?: A Little Help needed climbing 3-5 steps with a railing? : Total 6 Click Score: 16    End of Session   Activity Tolerance: Patient tolerated treatment well Patient left: in chair;with call bell/phone within reach;with chair alarm set;Other (comment) (with PA in room)   PT Visit Diagnosis: Unsteadiness on feet (R26.81);Other abnormalities of gait and mobility (R26.89);Muscle weakness (generalized) (M62.81);Difficulty in walking, not elsewhere classified (R26.2);Pain Pain - Right/Left: Right Pain - part of body: Hip     Time: 9055-8993 PT Time Calculation (min) (ACUTE ONLY): 22 min  Charges:    $Gait Training: 8-22 mins PT General Charges $$ ACUTE PT VISIT: 1 Visit                     Theo Ferretti, PT, DPT Acute Rehabilitation Services  Office: [phone]    Theo CHRISTELLA Ferretti 09/16/2024, 11:06 AM

## 2024-09-16 NOTE — Evaluation (Signed)
 Occupational Therapy Evaluation Patient Details Name: Sophia [name] MRN: [id number] DOB: 1951/10/30 Today's Date: 09/16/2024   History of Present Illness   Pt is a 73 y.o. female who presented 10/28 for R direct anterior THA. PMHx: tobacco use, HTN, DM2, anemia, cancer, CKD, arthritis, anxiety, and depression.     Clinical Impressions PTA, pt lives with son, typically Modified Independent with ADLs, IADLs and mobility with recent RW use to R hip pain. Pt presents now with post op pain and minor deficits in endurance and standing balance. Pt requires Setup for UB ADL and CGA-Min A for LB ADLs. Discussed shower transfer assist at home vs sponge bathing, as well as activity pacing. Pt reports family and friends able to assist as needed. Anticipate no OT needs upon DC. Will follow while admitted.     If plan is discharge home, recommend the following:   A little help with bathing/dressing/bathroom;Assistance with cooking/housework;Help with stairs or ramp for entrance;Assist for transportation     Functional Status Assessment   Patient has had a recent decline in their functional status and demonstrates the ability to make significant improvements in function in a reasonable and predictable amount of time.     Equipment Recommendations   None recommended by OT     Recommendations for Other Services         Precautions/Restrictions   Precautions Precautions: Fall Restrictions Weight Bearing Restrictions Per Provider Order: Yes RLE Weight Bearing Per Provider Order: Weight bearing as tolerated     Mobility Bed Mobility               General bed mobility comments: EOB on arrival    Transfers Overall transfer level: Needs assistance Equipment used: Rolling walker (2 wheels) Transfers: Sit to/from Stand Sit to Stand: Contact guard assist           General transfer comment: Extra time to power up to stand due to pain, no LOB, CGA for safety      Balance  Overall balance assessment: Needs assistance Sitting-balance support: No upper extremity supported, Feet supported Sitting balance-Leahy Scale: Good     Standing balance support: Bilateral upper extremity supported, During functional activity Standing balance-Leahy Scale: Poor Standing balance comment: reliant on UE support                           ADL either performed or assessed with clinical judgement   ADL Overall ADL's : Needs assistance/impaired Eating/Feeding: Independent   Grooming: Supervision/safety;Standing;Oral care;Wash/dry face   Upper Body Bathing: Set up   Lower Body Bathing: Contact guard assist;Sit to/from stand;Sitting/lateral leans   Upper Body Dressing : Set up;Sitting   Lower Body Dressing: Minimal assistance;Sit to/from stand;Sitting/lateral leans   Toilet Transfer: Contact guard assist;Ambulation;Rolling walker (2 wheels)   Toileting- Clothing Manipulation and Hygiene: Supervision/safety;Sit to/from stand;Sitting/lateral lean       Functional mobility during ADLs: Contact guard assist;Rolling walker (2 wheels) General ADL Comments: Discussed using robe for son to assist with tub transfers and then facilitating bathing to maximize pt/family comfort and dignity     Vision Baseline Vision/History: 1 Wears glasses Ability to See in Adequate Light: 0 Adequate Patient Visual Report: No change from baseline Vision Assessment?: No apparent visual deficits     Perception         Praxis         Pertinent Vitals/Pain Pain Assessment Pain Assessment: Faces Faces Pain Scale: Hurts even more Pain Location: R  hip Pain Descriptors / Indicators: Discomfort, Grimacing, Guarding, Operative site guarding, Sore, Moaning Pain Intervention(s): Limited activity within patient's tolerance, Premedicated before session, Monitored during session     Extremity/Trunk Assessment Upper Extremity Assessment Upper Extremity Assessment: Overall WFL for  tasks assessed;Right hand dominant   Lower Extremity Assessment Lower Extremity Assessment: Defer to PT evaluation   Cervical / Trunk Assessment Cervical / Trunk Assessment: Normal   Communication Communication Communication: No apparent difficulties   Cognition Arousal: Alert Behavior During Therapy: WFL for tasks assessed/performed Cognition: No apparent impairments                               Following commands: Intact       Cueing  General Comments   Cueing Techniques: Verbal cues      Exercises     Shoulder Instructions      Home Living Family/patient expects to be discharged to:: Private residence Living Arrangements: Children Available Help at Discharge: Family;Available 24 hours/day Type of Home: House Home Access: Ramped entrance     Home Layout: Two level;Able to live on main level with bedroom/bathroom     Bathroom Shower/Tub: Producer, Television/film/video: Standard     Home Equipment: Grab bars - tub/shower;Shower seat;BSC/3in1;Rolling Environmental Consultant (2 wheels);Wheelchair Financial Trader (4 wheels)          Prior Functioning/Environment Prior Level of Function : Independent/Modified Independent;Driving             Mobility Comments: No AD until R hip pain began to get bad ~1 month ago then began to use RW ADLs Comments: reports independence with all ADL and iADLs.    OT Problem List: Decreased activity tolerance;Impaired balance (sitting and/or standing);Pain   OT Treatment/Interventions: Self-care/ADL training;Therapeutic exercise;DME and/or AE instruction;Energy conservation;Therapeutic activities;Patient/family education;Balance training      OT Goals(Current goals can be found in the care plan section)   Acute Rehab OT Goals Patient Stated Goal: pain control, home maybe tomorrow OT Goal Formulation: With patient Time For Goal Achievement: 09/30/24 Potential to Achieve Goals: Good   OT Frequency:  Min 2X/week     Co-evaluation              AM-PAC OT 6 Clicks Daily Activity     Outcome Measure Help from another person eating meals?: None Help from another person taking care of personal grooming?: A Little Help from another person toileting, which includes using toliet, bedpan, or urinal?: A Little Help from another person bathing (including washing, rinsing, drying)?: A Little Help from another person to put on and taking off regular upper body clothing?: A Little Help from another person to put on and taking off regular lower body clothing?: A Little 6 Click Score: 19   End of Session Equipment Utilized During Treatment: Rolling walker (2 wheels);Gait belt Nurse Communication: Mobility status  Activity Tolerance: Patient tolerated treatment well Patient left: in chair;with call bell/phone within reach;with chair alarm set  OT Visit Diagnosis: Unsteadiness on feet (R26.81);Other abnormalities of gait and mobility (R26.89);Muscle weakness (generalized) (M62.81)                Time: 9081-9060 OT Time Calculation (min): 21 min Charges:  OT General Charges $OT Visit: 1 Visit OT Evaluation $OT Eval Low Complexity: 1 Low  Mliss NOVAK, OTR/L Acute Rehab Services Office: [phone]   Mliss Fish 09/16/2024, 11:30 AM

## 2024-09-16 NOTE — Progress Notes (Signed)
 Subjective: 2 Days Post-Op Procedure(s) (LRB): RIGHT TOTAL HIP ARTHROPLASTY, ANTERIOR APPROACH (Right) Patient reports pain as mild.  Progressing well with PT . Walking this AM in hallway. No CP or SOB. Tolerating diet well.   Objective: Vital signs in last 24 hours: Temp:  [98.3 F (36.8 C)-99.5 F (37.5 C)] 99.3 F (37.4 C) (10/30 0751) Pulse Rate:  [72-82] 82 (10/30 0751) Resp:  [18-20] 18 (10/30 0751) BP: (104-125)/(40-61) 118/54 (10/30 0751) SpO2:  [85 %-100 %] 96 % (10/30 0751)  Intake/Output from previous day: 10/29 0701 - 10/30 0700 In: 100.9 [I.V.:0.9; IV Piggyback:100] Out: 325 [Urine:325] Intake/Output this shift: No intake/output data recorded.  Recent Labs    09/16/24 0351  HGB 8.2*   Recent Labs    09/16/24 0351  WBC 7.1  RBC 3.22*  HCT 25.8*  PLT 162   Recent Labs    09/15/24 1120  NA 136  K 3.1*  CL 94*  CO2 27  BUN 23  CREATININE 1.18*  GLUCOSE 127*  CALCIUM  8.5*   No results for input(s): LABPT, INR in the last 72 hours.  Right lower extremity: Dorsiflexion/Plantar flexion intact Incision: dressing C/D/I Compartment soft   Assessment/Plan: 2 Days Post-Op Procedure(s) (LRB): RIGHT TOTAL HIP ARTHROPLASTY, ANTERIOR APPROACH (Right) Up with therapy Discharge home with home health if patient remains stable and is safe for discharge from PT standpoint.  Acute on chronic anemia vital signs stable asymptomatic.      Skyy Nilan 09/16/2024, 10:23 AM

## 2024-09-17 ENCOUNTER — Telehealth: Payer: Self-pay

## 2024-09-17 DIAGNOSIS — Z471 Aftercare following joint replacement surgery: Secondary | ICD-10-CM | POA: Diagnosis not present

## 2024-09-17 DIAGNOSIS — I3139 Other pericardial effusion (noninflammatory): Secondary | ICD-10-CM | POA: Diagnosis not present

## 2024-09-17 DIAGNOSIS — I5032 Chronic diastolic (congestive) heart failure: Secondary | ICD-10-CM | POA: Diagnosis not present

## 2024-09-17 DIAGNOSIS — D631 Anemia in chronic kidney disease: Secondary | ICD-10-CM | POA: Diagnosis not present

## 2024-09-17 DIAGNOSIS — N1831 Chronic kidney disease, stage 3a: Secondary | ICD-10-CM | POA: Diagnosis not present

## 2024-09-17 DIAGNOSIS — Z96651 Presence of right artificial knee joint: Secondary | ICD-10-CM | POA: Diagnosis not present

## 2024-09-17 DIAGNOSIS — D509 Iron deficiency anemia, unspecified: Secondary | ICD-10-CM | POA: Diagnosis not present

## 2024-09-17 DIAGNOSIS — E1122 Type 2 diabetes mellitus with diabetic chronic kidney disease: Secondary | ICD-10-CM | POA: Diagnosis not present

## 2024-09-17 DIAGNOSIS — I130 Hypertensive heart and chronic kidney disease with heart failure and stage 1 through stage 4 chronic kidney disease, or unspecified chronic kidney disease: Secondary | ICD-10-CM | POA: Diagnosis not present

## 2024-09-17 NOTE — Transitions of Care (Post Inpatient/ED Visit) (Signed)
   09/17/2024  Name: Sophia [name] MRN: [id number] DOB: 1951-01-22  Today's TOC FU Call Status: Today's TOC FU Call Status:: Unsuccessful Call (1st Attempt) Unsuccessful Call (1st Attempt) Date: 09/17/24  Attempted to reach the patient regarding the most recent Inpatient/ED visit.  Follow Up Plan: Additional outreach attempts will be made to reach the patient to complete the Transitions of Care (Post Inpatient/ED visit) call.   Alan Ee, RN, BSN, CEN [employer]- Transition of Care Team.  [facility] [phone]

## 2024-09-20 ENCOUNTER — Encounter: Payer: Self-pay | Admitting: Radiology

## 2024-09-20 ENCOUNTER — Telehealth: Payer: Self-pay | Admitting: *Deleted

## 2024-09-20 DIAGNOSIS — I130 Hypertensive heart and chronic kidney disease with heart failure and stage 1 through stage 4 chronic kidney disease, or unspecified chronic kidney disease: Secondary | ICD-10-CM | POA: Diagnosis not present

## 2024-09-20 NOTE — Transitions of Care (Post Inpatient/ED Visit) (Signed)
   09/20/2024  Name: Sophia [name] MRN: [id number] DOB: [birth year]  Today's TOC FU Call Status: Today's TOC FU Call Status:: Successful TOC FU Call Completed TOC FU Call Complete Date: 09/20/24 Patient's Name and Date of Birth confirmed.  Transition Care Management Follow-up Telephone Call Date of Discharge: 09/16/24 Discharge Facility: [facility]) Type of Discharge: Inpatient Admission Primary Inpatient Discharge Diagnosis:: Unilateral primary osteoarthritis, right hip How have you been since you were released from the hospital?: Better Any questions or concerns?: No  Items Reviewed: Did you receive and understand the discharge instructions provided?: Yes Medications obtained,verified, and reconciled?: No Medications Not Reviewed Reasons:: Other: (Spoke with DPR, she did not have medication list with her and was unable to review) Dietary orders reviewed?: Yes Type of Diet Ordered:: heart healthy, low sodium Do you have support at home?: Yes People in Home [RPT]: child(ren), adult Name of Support/Comfort Primary Source: Adult son, niece/[name] lives close and is supportive  Medications Reviewed Today: Medications Reviewed Today   Medications were not reviewed in this encounter     Home Care and Equipment/Supplies: Were Home Health Services Ordered?: Yes Name of Home Health Agency:: [facility] Has Agency set up a time to come to your home?: Yes First Home Health Visit Date: 09/17/24 Any new equipment or medical supplies ordered?: No  Functional Questionnaire: Do you need assistance with bathing/showering or dressing?: No Do you need assistance with meal preparation?: No Do you need assistance with eating?: No Do you have difficulty maintaining continence: No Do you need assistance with getting out of bed/getting out of a chair/moving?: No Do you have difficulty managing or taking your medications?: No  Follow up appointments reviewed: PCP Follow-up appointment confirmed?:  No (Offered assistance with scheduling PCP Hospital follow up. DPR will call and schedule.) MD Provider Line [phone] Given: No Specialist Hospital Follow-up appointment confirmed?: Yes Date of Specialist follow-up appointment?: 09/27/24 Follow-Up Specialty Provider:: Ortho  with Bertrum Gaskins Do you need transportation to your follow-up appointment?: No Do you understand care options if your condition(s) worsen?: Yes-patient verbalized understanding  SDOH Interventions Today    Flowsheet Row Most Recent Value  SDOH Interventions   Food Insecurity Interventions Intervention Not Indicated  Housing Interventions Intervention Not Indicated  Transportation Interventions Intervention Not Indicated  Utilities Interventions Intervention Not Indicated    Andrea Dimes RN, BSN [place]  [facility] RN Care Manager [phone]

## 2024-09-22 DIAGNOSIS — Z96651 Presence of right artificial knee joint: Secondary | ICD-10-CM | POA: Diagnosis not present

## 2024-09-22 DIAGNOSIS — I130 Hypertensive heart and chronic kidney disease with heart failure and stage 1 through stage 4 chronic kidney disease, or unspecified chronic kidney disease: Secondary | ICD-10-CM | POA: Diagnosis not present

## 2024-09-22 DIAGNOSIS — I5032 Chronic diastolic (congestive) heart failure: Secondary | ICD-10-CM | POA: Diagnosis not present

## 2024-09-22 DIAGNOSIS — E1122 Type 2 diabetes mellitus with diabetic chronic kidney disease: Secondary | ICD-10-CM | POA: Diagnosis not present

## 2024-09-22 DIAGNOSIS — N1831 Chronic kidney disease, stage 3a: Secondary | ICD-10-CM | POA: Diagnosis not present

## 2024-09-22 DIAGNOSIS — I3139 Other pericardial effusion (noninflammatory): Secondary | ICD-10-CM | POA: Diagnosis not present

## 2024-09-22 DIAGNOSIS — D509 Iron deficiency anemia, unspecified: Secondary | ICD-10-CM | POA: Diagnosis not present

## 2024-09-22 DIAGNOSIS — Z471 Aftercare following joint replacement surgery: Secondary | ICD-10-CM | POA: Diagnosis not present

## 2024-09-22 DIAGNOSIS — D631 Anemia in chronic kidney disease: Secondary | ICD-10-CM | POA: Diagnosis not present

## 2024-09-24 ENCOUNTER — Telehealth: Payer: Self-pay

## 2024-09-24 NOTE — Telephone Encounter (Signed)
 Appointment scheduled.

## 2024-09-24 NOTE — Telephone Encounter (Signed)
 Copied from CRM #[id number]. Topic: Referral - Request for Referral >> Sep 24, 2024  8:54 AM Myrick T wrote: Did the patient discuss referral with their provider in the last year? Yes  Appointment offered? No  Type of order/referral and detailed reason for visit: licensed therapist, phsycologist for grief and depression  Preference of office, provider, location: unknown  If referral order, have you been seen by this specialty before? No  Can we respond through MyChart? No

## 2024-09-24 NOTE — Telephone Encounter (Signed)
 Copied from CRM #[id number]. Topic: Appointments - Appointment Scheduling >> Sep 24, 2024  8:58 AM Myrick T wrote: Patient/patient representative is calling to schedule an appointment. Refer to attachments for appointment information. Patient needs an appt with Brain Fanco as she missed her appt in Aug, Please f/u with patients niece Augusto Husband

## 2024-09-27 ENCOUNTER — Ambulatory Visit: Admitting: Physician Assistant

## 2024-09-27 ENCOUNTER — Encounter: Payer: Self-pay | Admitting: Physician Assistant

## 2024-09-27 DIAGNOSIS — Z96641 Presence of right artificial hip joint: Secondary | ICD-10-CM

## 2024-09-27 NOTE — Telephone Encounter (Signed)
 Already has appt with brian

## 2024-09-27 NOTE — Progress Notes (Signed)
 HPI: [name] comes in today 2 weeks status post right total hip arthroplasty.  She states overall she is doing well.  She is only taking Tylenol  for pain.  She is on aspirin  twice daily for DVT prophylaxis.  She is ambulating with a cane.  No fevers or chills.  Physical exam:  General: Well-developed well-nourished female in no acute distress ambulates with a cane with a slight antalgic gait. Right hip surgical incisions healing well no signs of dehiscence or infection.  Staples well-approximated the incision.  Staples removed Steri-Strips applied.  Right calf supple nontender.  Dorsiflexion plantarflexion right ankle intact.  Impression: Status post right total hip arthroplasty  Plan: She will continue to work on range of motion and strengthening.  Follow-up with us  in 1 month sooner if there is any questions concerns.  Continue aspirin  once daily for another week and then discontinue as she was on no aspirin  prior to surgery.

## 2024-09-28 DIAGNOSIS — D509 Iron deficiency anemia, unspecified: Secondary | ICD-10-CM | POA: Diagnosis not present

## 2024-10-01 ENCOUNTER — Ambulatory Visit: Payer: Self-pay | Admitting: Professional Counselor

## 2024-10-02 DIAGNOSIS — I509 Heart failure, unspecified: Secondary | ICD-10-CM | POA: Diagnosis not present

## 2024-10-02 DIAGNOSIS — K760 Fatty (change of) liver, not elsewhere classified: Secondary | ICD-10-CM | POA: Diagnosis not present

## 2024-10-02 DIAGNOSIS — R011 Cardiac murmur, unspecified: Secondary | ICD-10-CM | POA: Diagnosis not present

## 2024-10-02 DIAGNOSIS — Z7982 Long term (current) use of aspirin: Secondary | ICD-10-CM | POA: Diagnosis not present

## 2024-10-02 DIAGNOSIS — K219 Gastro-esophageal reflux disease without esophagitis: Secondary | ICD-10-CM | POA: Diagnosis not present

## 2024-10-02 DIAGNOSIS — G4730 Sleep apnea, unspecified: Secondary | ICD-10-CM | POA: Diagnosis not present

## 2024-10-02 DIAGNOSIS — G4700 Insomnia, unspecified: Secondary | ICD-10-CM | POA: Diagnosis not present

## 2024-10-02 DIAGNOSIS — I2510 Atherosclerotic heart disease of native coronary artery without angina pectoris: Secondary | ICD-10-CM | POA: Diagnosis not present

## 2024-10-02 DIAGNOSIS — I700 Atherosclerosis of aorta: Secondary | ICD-10-CM | POA: Diagnosis not present

## 2024-10-02 DIAGNOSIS — M792 Neuralgia and neuritis, unspecified: Secondary | ICD-10-CM | POA: Diagnosis not present

## 2024-10-02 DIAGNOSIS — Z8616 Personal history of COVID-19: Secondary | ICD-10-CM | POA: Diagnosis not present

## 2024-10-02 DIAGNOSIS — E785 Hyperlipidemia, unspecified: Secondary | ICD-10-CM | POA: Diagnosis not present

## 2024-10-02 DIAGNOSIS — Z9181 History of falling: Secondary | ICD-10-CM | POA: Diagnosis not present

## 2024-10-02 DIAGNOSIS — I110 Hypertensive heart disease with heart failure: Secondary | ICD-10-CM | POA: Diagnosis not present

## 2024-10-02 DIAGNOSIS — F411 Generalized anxiety disorder: Secondary | ICD-10-CM | POA: Diagnosis not present

## 2024-10-02 DIAGNOSIS — F329 Major depressive disorder, single episode, unspecified: Secondary | ICD-10-CM | POA: Diagnosis not present

## 2024-10-02 DIAGNOSIS — F4320 Adjustment disorder, unspecified: Secondary | ICD-10-CM | POA: Diagnosis not present

## 2024-10-02 DIAGNOSIS — F4310 Post-traumatic stress disorder, unspecified: Secondary | ICD-10-CM | POA: Diagnosis not present

## 2024-10-02 DIAGNOSIS — M1990 Unspecified osteoarthritis, unspecified site: Secondary | ICD-10-CM | POA: Diagnosis not present

## 2024-10-05 DIAGNOSIS — I130 Hypertensive heart and chronic kidney disease with heart failure and stage 1 through stage 4 chronic kidney disease, or unspecified chronic kidney disease: Secondary | ICD-10-CM | POA: Diagnosis not present

## 2024-10-05 DIAGNOSIS — I5032 Chronic diastolic (congestive) heart failure: Secondary | ICD-10-CM | POA: Diagnosis not present

## 2024-10-05 DIAGNOSIS — D631 Anemia in chronic kidney disease: Secondary | ICD-10-CM | POA: Diagnosis not present

## 2024-10-05 DIAGNOSIS — Z471 Aftercare following joint replacement surgery: Secondary | ICD-10-CM | POA: Diagnosis not present

## 2024-10-05 DIAGNOSIS — E1122 Type 2 diabetes mellitus with diabetic chronic kidney disease: Secondary | ICD-10-CM | POA: Diagnosis not present

## 2024-10-05 DIAGNOSIS — N1831 Chronic kidney disease, stage 3a: Secondary | ICD-10-CM | POA: Diagnosis not present

## 2024-10-05 DIAGNOSIS — D509 Iron deficiency anemia, unspecified: Secondary | ICD-10-CM | POA: Diagnosis not present

## 2024-10-05 DIAGNOSIS — Z96651 Presence of right artificial knee joint: Secondary | ICD-10-CM | POA: Diagnosis not present

## 2024-10-07 DIAGNOSIS — N1831 Chronic kidney disease, stage 3a: Secondary | ICD-10-CM | POA: Diagnosis not present

## 2024-10-07 DIAGNOSIS — I3139 Other pericardial effusion (noninflammatory): Secondary | ICD-10-CM | POA: Diagnosis not present

## 2024-10-07 DIAGNOSIS — Z96651 Presence of right artificial knee joint: Secondary | ICD-10-CM | POA: Diagnosis not present

## 2024-10-07 DIAGNOSIS — D509 Iron deficiency anemia, unspecified: Secondary | ICD-10-CM | POA: Diagnosis not present

## 2024-10-07 DIAGNOSIS — D631 Anemia in chronic kidney disease: Secondary | ICD-10-CM | POA: Diagnosis not present

## 2024-10-07 DIAGNOSIS — I130 Hypertensive heart and chronic kidney disease with heart failure and stage 1 through stage 4 chronic kidney disease, or unspecified chronic kidney disease: Secondary | ICD-10-CM | POA: Diagnosis not present

## 2024-10-07 DIAGNOSIS — I5032 Chronic diastolic (congestive) heart failure: Secondary | ICD-10-CM | POA: Diagnosis not present

## 2024-10-07 DIAGNOSIS — Z471 Aftercare following joint replacement surgery: Secondary | ICD-10-CM | POA: Diagnosis not present

## 2024-10-11 ENCOUNTER — Other Ambulatory Visit: Payer: Self-pay | Admitting: Family Medicine

## 2024-10-12 ENCOUNTER — Encounter: Payer: Self-pay | Admitting: Internal Medicine

## 2024-10-22 ENCOUNTER — Emergency Department (HOSPITAL_COMMUNITY)

## 2024-10-22 ENCOUNTER — Emergency Department (HOSPITAL_COMMUNITY)
Admission: EM | Admit: 2024-10-22 | Discharge: 2024-10-22 | Disposition: A | Discharge status: Home / Self Care | Attending: Emergency Medicine | Admitting: Emergency Medicine

## 2024-10-22 ENCOUNTER — Encounter (HOSPITAL_COMMUNITY): Payer: Self-pay | Admitting: *Deleted

## 2024-10-22 ENCOUNTER — Other Ambulatory Visit: Payer: Self-pay

## 2024-10-22 DIAGNOSIS — I130 Hypertensive heart and chronic kidney disease with heart failure and stage 1 through stage 4 chronic kidney disease, or unspecified chronic kidney disease: Secondary | ICD-10-CM | POA: Insufficient documentation

## 2024-10-22 DIAGNOSIS — I509 Heart failure, unspecified: Secondary | ICD-10-CM | POA: Insufficient documentation

## 2024-10-22 DIAGNOSIS — E1122 Type 2 diabetes mellitus with diabetic chronic kidney disease: Secondary | ICD-10-CM | POA: Diagnosis not present

## 2024-10-22 DIAGNOSIS — Z7982 Long term (current) use of aspirin: Secondary | ICD-10-CM | POA: Diagnosis not present

## 2024-10-22 DIAGNOSIS — N390 Urinary tract infection, site not specified: Secondary | ICD-10-CM

## 2024-10-22 DIAGNOSIS — N183 Chronic kidney disease, stage 3 unspecified: Secondary | ICD-10-CM | POA: Insufficient documentation

## 2024-10-22 DIAGNOSIS — R1031 Right lower quadrant pain: Secondary | ICD-10-CM | POA: Diagnosis not present

## 2024-10-22 DIAGNOSIS — M25551 Pain in right hip: Secondary | ICD-10-CM | POA: Insufficient documentation

## 2024-10-22 LAB — URINALYSIS, ROUTINE W REFLEX MICROSCOPIC
Bilirubin Urine: NEGATIVE
Glucose, UA: NEGATIVE mg/dL
Ketones, ur: NEGATIVE mg/dL
Nitrite: NEGATIVE
Protein, ur: 30 mg/dL — AB
Specific Gravity, Urine: 1.017 (ref 1.005–1.030)
pH: 5 (ref 5.0–8.0)

## 2024-10-22 LAB — CBC WITH DIFFERENTIAL/PLATELET
Abs Immature Granulocytes: 0.02 K/uL (ref 0.00–0.07)
Basophils Absolute: 0 K/uL (ref 0.0–0.1)
Basophils Relative: 0 %
Eosinophils Absolute: 0 K/uL (ref 0.0–0.5)
Eosinophils Relative: 0 %
HCT: 34.1 % — ABNORMAL LOW (ref 36.0–46.0)
Hemoglobin: 10.6 g/dL — ABNORMAL LOW (ref 12.0–15.0)
Immature Granulocytes: 0 %
Lymphocytes Relative: 20 %
Lymphs Abs: 1.3 K/uL (ref 0.7–4.0)
MCH: 25.1 pg — ABNORMAL LOW (ref 26.0–34.0)
MCHC: 31.1 g/dL (ref 30.0–36.0)
MCV: 80.6 fL (ref 80.0–100.0)
Monocytes Absolute: 1.1 K/uL — ABNORMAL HIGH (ref 0.1–1.0)
Monocytes Relative: 16 %
Neutro Abs: 4.2 K/uL (ref 1.7–7.7)
Neutrophils Relative %: 64 %
Platelets: 220 K/uL (ref 150–400)
RBC: 4.23 MIL/uL (ref 3.87–5.11)
RDW: 16.9 % — ABNORMAL HIGH (ref 11.5–15.5)
WBC: 6.6 K/uL (ref 4.0–10.5)
nRBC: 0 % (ref 0.0–0.2)

## 2024-10-22 LAB — COMPREHENSIVE METABOLIC PANEL WITH GFR
ALT: 13 U/L (ref 0–44)
AST: 26 U/L (ref 15–41)
Albumin: 4.5 g/dL (ref 3.5–5.0)
Alkaline Phosphatase: 86 U/L (ref 38–126)
Anion gap: 17 — ABNORMAL HIGH (ref 5–15)
BUN: 30 mg/dL — ABNORMAL HIGH (ref 8–23)
CO2: 24 mmol/L (ref 22–32)
Calcium: 9.7 mg/dL (ref 8.9–10.3)
Chloride: 96 mmol/L — ABNORMAL LOW (ref 98–111)
Creatinine, Ser: 1.51 mg/dL — ABNORMAL HIGH (ref 0.44–1.00)
GFR, Estimated: 36 mL/min — ABNORMAL LOW (ref >60)
Glucose, Bld: 108 mg/dL — ABNORMAL HIGH (ref 70–99)
Potassium: 2.9 mmol/L — ABNORMAL LOW (ref 3.5–5.1)
Sodium: 137 mmol/L (ref 135–145)
Total Bilirubin: 0.3 mg/dL (ref 0.0–1.2)
Total Protein: 7.8 g/dL (ref 6.5–8.1)

## 2024-10-22 LAB — LIPASE, BLOOD: Lipase: 51 U/L (ref 11–51)

## 2024-10-22 MED ORDER — IOHEXOL 300 MG/ML  SOLN
75.0000 mL | Freq: Once | INTRAMUSCULAR | Status: AC | PRN
  Administered 2024-10-22: 75 mL via INTRAVENOUS

## 2024-10-22 MED ORDER — FENTANYL CITRATE (PF) 100 MCG/2ML IJ SOLN
12.5000 ug | Freq: Once | INTRAMUSCULAR | Status: AC
  Administered 2024-10-22: 12.5 ug via INTRAVENOUS
  Filled 2024-10-22: qty 2

## 2024-10-22 MED ORDER — ONDANSETRON HCL 4 MG/2ML IJ SOLN
4.0000 mg | Freq: Once | INTRAMUSCULAR | Status: AC
  Administered 2024-10-22: 4 mg via INTRAVENOUS
  Filled 2024-10-22: qty 2

## 2024-10-22 MED ORDER — SODIUM CHLORIDE 0.9 % IV SOLN
1.0000 g | Freq: Once | INTRAVENOUS | Status: AC
  Administered 2024-10-22: 1 g via INTRAVENOUS
  Filled 2024-10-22: qty 10

## 2024-10-22 MED ORDER — CEPHALEXIN 500 MG PO CAPS: 500.0000 mg | ORAL_CAPSULE | Freq: Two times a day (BID) | ORAL | 0 refills | Status: AC

## 2024-10-22 NOTE — ED Provider Notes (Signed)
 Physical Exam  BP (!) 135/57   Pulse 71   Temp 98.6 F (37 C) (Oral)   Resp 14   Ht 5' 9 (1.753 m)   SpO2 100%   BMI 26.58 kg/m   Physical Exam Vitals and nursing note reviewed.  Constitutional:      General: She is not in acute distress.    Appearance: Normal appearance. She is well-developed. She is not ill-appearing, toxic-appearing or diaphoretic.  HENT:     Head: Normocephalic and atraumatic.  Eyes:     General: No scleral icterus.    Extraocular Movements: Extraocular movements intact.     Conjunctiva/sclera: Conjunctivae normal.  Pulmonary:     Effort: Pulmonary effort is normal. No respiratory distress.  Abdominal:     General: Abdomen is flat. Bowel sounds are normal. There is no distension.     Palpations: Abdomen is soft.     Tenderness: There is abdominal tenderness (Patient reports improvement of right lower quadrant tenderness though there is still some there with deep palpation.). There is no guarding. Negative signs include Murphy's sign, Rovsing's sign and McBurney's sign.  Musculoskeletal:     Comments: Right upper thigh pain with movement of right lower extremity.  Skin:    General: Skin is warm and dry.     Coloration: Skin is not jaundiced or pale.  Neurological:     Mental Status: She is alert and oriented to person, place, and time.     Procedures  Procedures  ED Course / MDM    Medical Decision Making Amount and/or Complexity of Data Reviewed Labs: ordered. Radiology: ordered.  Risk Prescription drug management.   Signout from [employer] at shift change. Briefly, patient presents for right-sided hip pain, dysuria, and right lower quadrant abdominal pain.  She had right total hip replacement in late October.  Actually worsening pain of her right hip area that worsens with flexion of her leg and weightbearing.  Denies numbness or tingling of the right lower extremity.  Has follow-up scheduled with her orthopedic provider next  week.   Plan: CT abdomen pelvis pending at provider signout.  If this imaging comes back without any acute findings then patient's symptoms are likely secondary to UTI.  She will be sent home with antibiotics and will follow-up with her orthopedic provider next week.   11:50 PM Reassessment performed. Patient appears in good spirits.  She states that her right lower quadrant abdominal pain has improved.  She does still have pain in her right upper thigh with movement of the right lower extremity.  She is able to bear weight and ambulate.  She has family to help her in the outpatient setting including, 2 nieces that are with her today.  Labs and imaging personally reviewed and interpreted including: Urinalysis suspicious for UTI.  Urine culture sent.  No leukocytosis.   Reviewed additional pertinent lab work and imaging with patient at bedside including: Results of CT abdomen pelvis showing no acute findings in the abdomen or pelvis to explain current symptoms.   Most current vital signs reviewed and are as follows: BP (!) 135/57   Pulse 71   Temp 98.6 F (37 C) (Oral)   Resp 14   Ht 5' 9 (1.753 m)   SpO2 100%   BMI 26.58 kg/m     Plan: Treatment of UTI with prescription antibiotic regimen.  Follow-up with primary care for ongoing evaluation of improvement of symptoms.  Follow-up with orthopedic provider next week regarding  right hip pain.   Home treatment: Keflex  x 7 days.  Urine culture was sent in pharmacy will update patient if results are not susceptible to current treatment.   Return and follow-up instructions: Encouraged return to ED with worsening symptoms or development of new or concerning symptoms. Encouraged patient to follow-up with their provider in 5 days. Patient verbalized understanding and agreed with plan.        Rosina Almarie LABOR, PA-C 10/22/24 2354    Suzette Pac, MD 10/24/24 1010

## 2024-10-22 NOTE — Discharge Instructions (Signed)
 It was a pleasure meeting with you today.  As we discussed your urinalysis is suspicious for UTI and this is likely the cause of your symptoms as imaging including x-ray and CT did not show any acute findings related to your right hip or any other areas to explain the symptoms.  I have sent antibiotics to your pharmacy.  A pharmacist will contact you if the urine culture grows bacteria that are not susceptible to current treatment and we will send you medication, as needed.  Continue with scheduled follow-up with your orthopedic provider next week.  Please return if symptoms worsen or if any other concerning symptoms develop.

## 2024-10-22 NOTE — ED Notes (Signed)
 Upon head to toe assessment the pt was Aox4.

## 2024-10-22 NOTE — ED Triage Notes (Signed)
 Pt with lower to rt lower abd pain with emesis x 2 days.  Diarrhea x 2 yesterday and today.

## 2024-10-22 NOTE — ED Provider Notes (Signed)
 [facility]GENC[facility]PARTMENT AT [facility] Provider Note   CSN: [id number] Arrival date & time: 10/22/24  1629     Patient presents with: Abdominal Pain   Sophia [name] is a 73 y.o. female.    Abdominal Pain Associated symptoms: diarrhea, dysuria, nausea and vomiting        Sophia [name] is a 73 y.o. female with past medical history of CHF, hypertension, anemia, type 2 diabetes, CKD prior lung cancer not currently on chemotherapy who presents to the Emergency Department complaining of right lower abdominal pain, dysuria and right hip pain.  She underwent right total hip replacement in late October.  She has had some gradually worsening pain of her hip area that worsens with flexion of her leg and with weightbearing.  She also endorses right lower abdominal pain with loose stools and vomiting x 2 days.  No fever or chills.  She feels as though she is urinating frequently.  No bloody or black stools no hematuria.  She has a follow-up appointment next week with her orthopedic provider.  Prior to Admission medications   Medication Sig Start Date End Date Taking? Authorizing Provider  acetaminophen  (TYLENOL ) 650 MG CR tablet Take 650 mg by mouth every 8 (eight) hours as needed for pain.   Yes [provider]  albuterol  (VENTOLIN  HFA) 108 (90 Base) MCG/ACT inhaler Inhale 2 puffs into the lungs every 6 (six) hours as needed for wheezing or shortness of breath. 01/30/24  Yes Antonetta Rollene BRAVO, MD  amLODipine  (NORVASC ) 10 MG tablet Take 10 mg by mouth daily. 07/22/24  Yes [provider]  buPROPion  (WELLBUTRIN  XL) 150 MG 24 hr tablet Take 1 tablet (150 mg total) by mouth daily. 10/11/24  Yes Antonetta Rollene BRAVO, MD  busPIRone  (BUSPAR ) 30 MG tablet Take one tablet by mouth two times daily 09/09/24  Yes Antonetta Rollene BRAVO, MD  chlorthalidone  (HYGROTON ) 25 MG tablet Take 25 mg by mouth daily.   Yes [provider]  ferrous gluconate  (FERGON) 324 MG tablet  Take 324 mg by mouth daily with breakfast.   Yes [provider]  FLUoxetine  (PROZAC ) 40 MG capsule Take 1 capsule (40 mg total) by mouth daily. 06/25/24  Yes Antonetta Rollene BRAVO, MD  gabapentin  (NEURONTIN ) 300 MG capsule Take 1 capsule (300 mg total) by mouth 2 (two) times daily. 09/09/24  Yes Kerrin Elspeth BROCKS, MD  methocarbamol  (ROBAXIN ) 500 MG tablet Take 1 tablet (500 mg total) by mouth every 6 (six) hours as needed for muscle spasms. 09/16/24  Yes Gretta Bertrum ORN, PA-C  Multiple Vitamin (MULTIVITAMIN) tablet Take 1 tablet by mouth daily.   Yes [provider]  olmesartan -hydrochlorothiazide  (BENICAR  HCT) 20-12.5 MG tablet TAKE ONE TABLET BY MOUTH EVERY DAY 05/18/24  Yes Wert, Michael B, MD  polyethylene glycol (MIRALAX  / GLYCOLAX ) 17 g packet Take 17 g by mouth daily as needed for moderate constipation.   Yes [provider]  rosuvastatin  (CRESTOR ) 20 MG tablet TAKE 1 TABLET BY MOUTH EVERY DAY *NEW PRESCRIPTION REQUEST* Patient taking differently: Take 20 mg by mouth daily. 06/23/24  Yes Antonetta Rollene BRAVO, MD  sucralfate  (CARAFATE ) 1 g tablet TAKE ONE (1) TABLET BY MOUTH TWICE DAILY *NEW PRESCRIPTION REQUEST* Patient taking differently: Take 1 g by mouth 2 (two) times daily. 06/23/24  Yes Antonetta Rollene BRAVO, MD  traZODone  (DESYREL ) 50 MG tablet Take 0.5-1 tablets (25-50 mg total) by mouth at bedtime as needed for sleep. Patient taking differently: Take 25-50 mg by mouth  at bedtime. 10/11/24  Yes Antonetta Rollene BRAVO, MD  aspirin  81 MG chewable tablet Chew 1 tablet (81 mg total) by mouth 2 (two) times daily. Patient not taking: Reported on 10/22/2024 09/16/24   Gretta Bertrum ORN, PA-C  oxyCODONE  (OXY IR/ROXICODONE ) 5 MG immediate release tablet Take 1 tablet (5 mg total) by mouth every 4 (four) hours as needed for moderate pain (pain score 4-6) (pain score 4-6). Patient not taking: Reported on 10/22/2024 09/16/24   Gretta Bertrum ORN, PA-C    Allergies: Farxiga   [dapagliflozin ] and Jardiance [empagliflozin]    Review of Systems  Constitutional:  Positive for appetite change.  Gastrointestinal:  Positive for abdominal pain, diarrhea, nausea and vomiting.  Genitourinary:  Positive for dysuria.  Musculoskeletal:  Positive for arthralgias (Right hip pain).    Updated Vital Signs BP 111/66 (BP Location: Left Arm)   Pulse 94   Temp 98.6 F (37 C) (Oral)   Resp 18   Ht 5' 9 (1.753 m)   SpO2 97%   BMI 26.58 kg/m   Physical Exam Vitals and nursing note reviewed.  Constitutional:      General: She is not in acute distress.    Appearance: Normal appearance. She is well-developed. She is not ill-appearing.  HENT:     Mouth/Throat:     Mouth: Mucous membranes are moist.  Cardiovascular:     Rate and Rhythm: Normal rate and regular rhythm.     Pulses: Normal pulses.  Pulmonary:     Effort: Pulmonary effort is normal.  Abdominal:     Palpations: Abdomen is soft.     Tenderness: There is abdominal tenderness. There is no right CVA tenderness or left CVA tenderness.  Musculoskeletal:        General: Tenderness present. No signs of injury.     Comments: Pain on range of motion right hip.  Pain to hip and thigh area with flexion at the knee.  5 out of 5 strength of the bilateral lower extremities.  Surgical incision appears well-healed, no excessive warmth or erythema over the joint.  Skin:    General: Skin is warm.     Capillary Refill: Capillary refill takes less than 2 seconds.  Neurological:     General: No focal deficit present.     Mental Status: She is alert.     Motor: No weakness.     (all labs ordered are listed, but only abnormal results are displayed) Labs Reviewed  CBC WITH DIFFERENTIAL/PLATELET - Abnormal; Notable for the following components:      Result Value   Hemoglobin 10.6 (*)    HCT 34.1 (*)    MCH 25.1 (*)    RDW 16.9 (*)    Monocytes Absolute 1.1 (*)    All other components within normal limits  COMPREHENSIVE  METABOLIC PANEL WITH GFR  URINALYSIS, ROUTINE W REFLEX MICROSCOPIC  LIPASE, BLOOD    EKG: None  Radiology: DG Hip Unilat W or Wo Pelvis 2-3 Views Right Result Date: 10/22/2024 CLINICAL DATA:  Right hip pain EXAM: DG HIP (WITH OR WITHOUT PELVIS) 2-3V RIGHT COMPARISON:  09/14/2024 FINDINGS: Frontal view of the pelvis as well as frontal and frogleg lateral views of the right hip are obtained. Right hip arthroplasty is identified in the expected position without evidence of acute complication. There are no acute displaced fractures. Mild left hip osteoarthritis. IMPRESSION: 1. No acute displaced fracture. 2. Unremarkable right hip arthroplasty Electronically Signed   By: Ozell Daring M.D.   On: 10/22/2024  19:09     Procedures   Medications Ordered in the ED  fentaNYL  (SUBLIMAZE ) injection 12.5 mcg (12.5 mcg Intravenous Given 10/22/24 1838)  ondansetron  (ZOFRAN ) injection 4 mg (4 mg Intravenous Given 10/22/24 1834)                                    Medical Decision Making   Patient here from home for evaluation of nausea vomiting, right lower abdominal pain and right hip pain and some loose stools.  Hip pain has been present for some time.  She had hip replacement in late October.  No recent falls.  Domino pain with nausea vomiting is new for 2 days.  She denies black or bloody stools or hematuria.  She does endorse some increased urinary frequency  Right lower abdominal pain differential would include but not limited to acute appendicitis, SBO, diverticulitis also considered as well as acute cystitis.  She is having right hip pain which may be more abdominal, but given recent surgery I feel that x-ray is warranted.  Doubt septic joint.  Amount and/or Complexity of Data Reviewed Labs: ordered.    Details: Labs, no leukocytosis, hemoglobin 10.6 today.  Improved from recent.  Urinalysis shows moderate blood and hazy urine with large leukocytes 21-50 white cells and rare bacteria.  Urine  culture ordered. Radiology: ordered.    Details: CT abdomen and pelvis pending  X-ray of the right hip without displaced fracture unremarkable right hip arthroplasty Discussion of management or test interpretation with external provider(s):   Likely urinary tract infection.  Some of workup still pending.  Discussed with Almarie Knee, PA-C who assumes care  Risk Prescription drug management.        Final diagnoses:  Right lower quadrant abdominal pain  Pain of right hip    ED Discharge Orders     None          Herlinda Milling, DEVONNA 10/22/24 ARTEMUS Suzette Pac, MD 10/24/24 1010

## 2024-10-24 LAB — URINE CULTURE: Culture: <10000 — AB

## 2024-10-25 ENCOUNTER — Ambulatory Visit: Admitting: Physician Assistant

## 2024-10-25 ENCOUNTER — Encounter: Payer: Self-pay | Admitting: Physician Assistant

## 2024-10-25 DIAGNOSIS — Z96641 Presence of right artificial hip joint: Secondary | ICD-10-CM

## 2024-10-25 NOTE — Progress Notes (Signed)
 HPI: Sophia [name] returns today almost 6 weeks status post right total hip arthroplasty.  She states her pain is 4 out of 10 at its worst.  She rarely takes her oxycodone  methocarbamol  or Neurontin .  States that the incisions healed well.  No problems with the incision no fevers chills.  She has some discomfort over the lateral aspect of the hip though.  She has no groin pain.  She notes that the hip is stiff whenever she first gets up to ambulate.  She has been doing abduction exercises.  Review of systems: See HPI otherwise negative  Physical exam: General well-developed well-nourished female who is able to ambulate about the room without any assistive device.  Antalgic gait present. Right hip: Good range of motion of the right hip without pain.  Tenderness over the trochanteric region.  Right calf supple nontender.  Dorsiflexion plantarflexion right ankle intact.  Impression: Status post right total hip arthroplasty 09/14/2024 Right hip trochanteric bursitis  Plan: Recommend she stop abduction exercises.  She will work on IT band stretching exercises as shown.  Will see her back in 4 weeks she continues to have pain lateral aspect of the hip recommended trochanteric injection.  Questions were encouraged and answered at length.

## 2024-10-26 ENCOUNTER — Inpatient Hospital Stay: Attending: Oncology

## 2024-10-27 ENCOUNTER — Inpatient Hospital Stay: Attending: Oncology

## 2024-10-27 DIAGNOSIS — Z85118 Personal history of other malignant neoplasm of bronchus and lung: Secondary | ICD-10-CM | POA: Insufficient documentation

## 2024-10-27 DIAGNOSIS — D649 Anemia, unspecified: Secondary | ICD-10-CM

## 2024-10-27 DIAGNOSIS — D509 Iron deficiency anemia, unspecified: Secondary | ICD-10-CM | POA: Diagnosis present

## 2024-10-27 DIAGNOSIS — C3491 Malignant neoplasm of unspecified part of right bronchus or lung: Secondary | ICD-10-CM

## 2024-10-27 LAB — COMPREHENSIVE METABOLIC PANEL WITH GFR
ALT: 22 U/L (ref 0–44)
AST: 24 U/L (ref 15–41)
Albumin: 4.3 g/dL (ref 3.5–5.0)
Alkaline Phosphatase: 82 U/L (ref 38–126)
Anion gap: 15 (ref 5–15)
BUN: 32 mg/dL — ABNORMAL HIGH (ref 8–23)
CO2: 26 mmol/L (ref 22–32)
Calcium: 9.9 mg/dL (ref 8.9–10.3)
Chloride: 103 mmol/L (ref 98–111)
Creatinine, Ser: 1.25 mg/dL — ABNORMAL HIGH (ref 0.44–1.00)
GFR, Estimated: 45 mL/min — ABNORMAL LOW (ref >60)
Glucose, Bld: 86 mg/dL (ref 70–99)
Potassium: 3.5 mmol/L (ref 3.5–5.1)
Sodium: 144 mmol/L (ref 135–145)
Total Bilirubin: <0.2 mg/dL (ref 0.0–1.2)
Total Protein: 7.3 g/dL (ref 6.5–8.1)

## 2024-10-27 LAB — CBC WITH DIFFERENTIAL/PLATELET
Abs Immature Granulocytes: 0.01 K/uL (ref 0.00–0.07)
Basophils Absolute: 0 K/uL (ref 0.0–0.1)
Basophils Relative: 0 %
Eosinophils Absolute: 0.2 K/uL (ref 0.0–0.5)
Eosinophils Relative: 3 %
HCT: 31.7 % — ABNORMAL LOW (ref 36.0–46.0)
Hemoglobin: 9.8 g/dL — ABNORMAL LOW (ref 12.0–15.0)
Immature Granulocytes: 0 %
Lymphocytes Relative: 31 %
Lymphs Abs: 1.9 K/uL (ref 0.7–4.0)
MCH: 25.2 pg — ABNORMAL LOW (ref 26.0–34.0)
MCHC: 30.9 g/dL (ref 30.0–36.0)
MCV: 81.5 fL (ref 80.0–100.0)
Monocytes Absolute: 0.6 K/uL (ref 0.1–1.0)
Monocytes Relative: 10 %
Neutro Abs: 3.3 K/uL (ref 1.7–7.7)
Neutrophils Relative %: 56 %
Platelets: 285 K/uL (ref 150–400)
RBC: 3.89 MIL/uL (ref 3.87–5.11)
RDW: 16.9 % — ABNORMAL HIGH (ref 11.5–15.5)
WBC: 6 K/uL (ref 4.0–10.5)
nRBC: 0 % (ref 0.0–0.2)

## 2024-10-27 LAB — FOLATE: Folate: >20 ng/mL (ref >5.9)

## 2024-10-27 LAB — IRON AND TIBC
Iron: 32 ug/dL (ref 28–170)
Saturation Ratios: 12 % (ref 10.4–31.8)
TIBC: 267 ug/dL (ref 250–450)
UIBC: 235 ug/dL

## 2024-10-27 LAB — FERRITIN: Ferritin: 194 ng/mL (ref 11–307)

## 2024-10-27 LAB — VITAMIN B12: Vitamin B-12: 2717 pg/mL — ABNORMAL HIGH (ref 180–914)

## 2024-11-01 ENCOUNTER — Inpatient Hospital Stay: Admitting: Physician Assistant

## 2024-11-01 VITALS — BP 129/62 | HR 74 | Temp 98.1°F | Resp 16 | Wt 175.2 lb

## 2024-11-01 DIAGNOSIS — D539 Nutritional anemia, unspecified: Secondary | ICD-10-CM

## 2024-11-01 DIAGNOSIS — C3491 Malignant neoplasm of unspecified part of right bronchus or lung: Secondary | ICD-10-CM | POA: Diagnosis not present

## 2024-11-01 DIAGNOSIS — D509 Iron deficiency anemia, unspecified: Secondary | ICD-10-CM | POA: Diagnosis not present

## 2024-11-01 NOTE — Progress Notes (Signed)
 [facility]   PROGRESS NOTE  Patient Care Team: Antonetta Rollene BRAVO, MD as PCP - General (Family Medicine) Stacia Diannah SQUIBB, MD as PCP - Cardiology (Cardiology) Roz Anes, MD as Consulting Physician (Ophthalmology) Margrette Taft BRAVO, MD as Consulting Physician (Orthopedic Surgery) Cindie Carlin POUR, DO as Consulting Physician (Internal Medicine)  CHIEF COMPLAINTS/PURPOSE OF CONSULTATION:  Stage 1b non-small cell lung cancer  ONCOLOGIC HISTORY: 12/13/2023: CT chest without contrast:10 x 8 mm subpleural right posterior lower lobe pulmonary nodule.  01/09/2024: PET scan:10 mm right lower lobe nodule does not show metabolism above blood pool but has enlarged from 08/11/2022. Low-grade adenocarcinoma cannot be definitively excluded. Small pericardial effusion, minimally improved. Left adrenal adenoma. 03/31/2024: Right lower lobe wedge resection: Adenocarcinoma, acinar, 1.1 cm. Carcinoma involves visceral pleural connective tissue (pT2a). Surgical margins negative for carcinoma. Negative for lymphovascular involvement. Lymphovascular Invasion: Not identified Regional Lymph Nodes:Nodal Sites Examined: 4R, 7, 8, 10, 11: Negative -Pathologic Stage: pT2a, pN0  07/13/2024: CT CAP: Surgical changes of right lower lobe wedge resection. Small right pleural effusion and adjacent consolidation obscures the suture line limiting evaluation for residual/new nodularity. Stable scattered tiny pulmonary nodules. No new suspicious pulmonary nodules or masses. No evidence of metastatic disease within the abdomen or pelvis.   CURRENT TREATMENT: Active surveillance  INTERVAL HISTORY:  Sophia [name] 73 y.o. female returns for follow-up for stage Ib non-small cell lung cancer.  She was last seen by Dr. Davonna on 07/26/2024.  In the interim, she has continued on active surveillance and recently underwent right hip replacement on 09/14/2024.  She is unaccompanied for this visit.  On exam today,  Sophia [name] reports that her energy and appetite are improving after undergoing hip surgery.  She did briefly lose her appetite after the surgery which resulted in her losing approximately 10 pounds since the last visit.  She denies nausea, vomiting or bowel habit changes.  She denies easy bruising or signs of active bleeding.  She denies fevers, chills, night sweats, shortness of breath, chest pain or cough.  Rest of 10 point ROS as below.  MEDICAL HISTORY:  Past Medical History:  Diagnosis Date   Acute respiratory disease due to COVID-19 virus 08/28/2019   Anemia    Iron  Deficiency   Anxiety    Arthritis    Cancer (HCC) 02/2024   Lung Cancer   Chronic kidney disease    Depression    Diabetes mellitus type 2 in obese 11/28/2010   Qualifier: Diagnosis of  By: Antonetta MD, Margaret  Diet controlled in 12/2016    Diabetes mellitus without complication (HCC)    Hypercholesteremia    Hypertension    PONV (postoperative nausea and vomiting)    Septic shock (HCC) 06/13/2024    SURGICAL HISTORY: Past Surgical History:  Procedure Laterality Date   ABDOMINAL HYSTERECTOMY  11/18/1980   tubes and womb, bleeding and ectopic   ABDOMINAL SURGERY     removal of tumors   BALLOON DILATION N/A 02/16/2021   Procedure: BALLOON DILATION;  Surgeon: Cindie Carlin POUR, DO;  [facility];  Service: Endoscopy;  Laterality: N/A;   BIOPSY  02/16/2021   Procedure: BIOPSY;  Surgeon: Cindie Carlin POUR, DO;  [facility]: [facility];  Service: Endoscopy;;   BREAST SURGERY Left 11/18/1981   benign tumor   CATARACT EXTRACTION W/PHACO Right 01/11/2014   Procedure: CATARACT EXTRACTION PHACO AND INTRAOCULAR LENS PLACEMENT (IOC);  Surgeon: Anes T. Roz, MD;  Location: [facility] OR[facility];  Service: Ophthalmology;  Laterality: Right;  CDE 5.57   CATARACT EXTRACTION W/PHACO Left 01/25/2014   Procedure: CATARACT EXTRACTION PHACO AND INTRAOCULAR LENS PLACEMENT (IOC);  Surgeon: Oneil T. Roz, MD;  Location: [facility];   Service: Ophthalmology;  Laterality: Left;  CDE:5.19   COLONOSCOPY  2007 [facility]   NL EXAM   COLONOSCOPY N/A 04/22/2014   Dr. Fields:Normal mucosa in the terminal ileum/Two COLON polyps REMOVED/ Mild diverticulosis in the ascending colon and transverse colon/The LEFT colon IS redundant/Small internal hemorrhoids. Path: tubular adenoma. Next colonoscopy in 5-10 years   COLONOSCOPY WITH PROPOFOL  N/A 11/27/2020   Surgeon: Cindie Carlin POUR, DO;  Nonbleeding internal hemorrhoids, one 6 mm transverse colon polyp resected, diverticulosis in the sigmoid colon.  Pathology with tubular adenoma.  Repeat in 5 years.   ESOPHAGOGASTRODUODENOSCOPY N/A 04/22/2014   Dr. Fields:MICROCYTIC ANEMIA MOST LIKELY DUE TO ASA/VOLTAREN /Small hiatal hernia/MODERATE Non-erosive gastritis. Negative H.pylori   ESOPHAGOGASTRODUODENOSCOPY (EGD) WITH PROPOFOL  N/A 02/16/2021   Surgeon: Cindie Carlin POUR, DO; benign-appearing esophageal stenosis s/p dilation, gastritis biopsied.  Biopsies were benign.   INTERCOSTAL NERVE BLOCK Right 03/31/2024   Procedure: BLOCK, NERVE, INTERCOSTAL;  Surgeon: Kerrin Elspeth BROCKS, MD;  Location: [facility] OR;  Service: Thoracic;  Laterality: Right;   LEFT HEART CATH AND CORONARY ANGIOGRAPHY N/A 06/23/2018   Procedure: LEFT HEART CATH AND CORONARY ANGIOGRAPHY;  Surgeon: Dann Candyce RAMAN, MD;  Location: [facility] INVASIVE CV LAB;  Service: Cardiovascular;  Laterality: N/A;   LYMPH NODE BIOPSY Right 03/31/2024   Procedure: LYMPH NODE BIOPSY;  Surgeon: Kerrin Elspeth BROCKS, MD;  Location: [facility] OR;  Service: Thoracic;  Laterality: Right;   POLYPECTOMY  11/27/2020   Procedure: POLYPECTOMY;  Surgeon: Cindie Carlin POUR, DO;  [facility];  Service: Endoscopy;;   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   TOTAL HIP ARTHROPLASTY Right 09/14/2024   Procedure: RIGHT TOTAL HIP ARTHROPLASTY, ANTERIOR APPROACH;  Surgeon: Vernetta Lonni GRADE, MD;  Location: [facility] OR;  Service: Orthopedics;  Laterality:  Right;   ULTRASOUND GUIDANCE FOR VASCULAR ACCESS  06/23/2018   Procedure: Ultrasound Guidance For Vascular Access;  Surgeon: Dann Candyce RAMAN, MD;  Location: [facility] INVASIVE CV LAB;  Service: Cardiovascular;;   UPPER GASTROINTESTINAL ENDOSCOPY  2007 CHEST PAIN   NL EXAM   WEDGE RESECTION, LUNG, ROBOT-ASSISTED, THORACOSCOPIC  03/31/2024   Procedure: WEDGE RESECTION, LUNG, ROBOT-ASSISTED, THORACOSCOPIC;  Surgeon: Kerrin Elspeth BROCKS, MD;  Location: [facility] OR;  Service: Thoracic;;   YAG LASER APPLICATION Right 07/12/2014   Procedure: YAG LASER APPLICATION;  Surgeon: Oneil T. Roz, MD;  Location: [facility];  Service: Ophthalmology;  Laterality: Right;   YAG LASER APPLICATION Left 08/15/2015   Procedure: YAG LASER APPLICATION;  Surgeon: Oneil Roz, MD;  Location: [facility];  Service: Ophthalmology;  Laterality: Left;    SOCIAL HISTORY: Social History   Socioeconomic History   Marital status: Married    Spouse name: Not on file   Number of children: Not on file   Years of education: Not on file   Highest education level: Not on file  Occupational History   Not on file  Tobacco Use   Smoking status: Former    Current packs/day: 0.00    Average packs/day: 0.3 packs/day for 20.0 years (5.0 ttl pk-yrs)    Types: Cigarettes    Start date: 01/05/1970    Quit date: 01/05/1990    Years since quitting: 34.8   Smokeless tobacco: Current    Types: Snuff  Vaping Use   Vaping status: Never Used  Substance and Sexual Activity  Alcohol use: No   Drug use: Yes    Types: Marijuana    Comment: occ marijuana,last 09/10/24   Sexual activity: Not Currently    Birth control/protection: Surgical  Other Topics Concern   Not on file  Social History Narrative   Not on file   Social Drivers of Health   Tobacco Use: High Risk (10/25/2024)   Patient History    Smoking Tobacco Use: Former    Smokeless Tobacco Use: Current    Passive Exposure: Not on file  Financial Resource Strain: Low Risk (10/22/2023)    Overall Financial Resource Strain (CARDIA)    Difficulty of Paying Living Expenses: Not hard at all  Food Insecurity: No Food Insecurity (09/20/2024)   Epic    Worried About Programme Researcher, Broadcasting/film/video in the Last Year: Never true    Ran Out of Food in the Last Year: Never true  Transportation Needs: No Transportation Needs (09/20/2024)   Epic    Lack of Transportation (Medical): No    Lack of Transportation (Non-Medical): No  Physical Activity: Insufficiently Active (10/22/2023)   Exercise Vital Sign    Days of Exercise per Week: 3 days    Minutes of Exercise per Session: 30 min  Stress: No Stress Concern Present (10/22/2023)   [employer] of Occupational Health - Occupational Stress Questionnaire    Feeling of Stress : Not at all  Social Connections: Moderately Integrated (09/14/2024)   Social Connection and Isolation Panel    Frequency of Communication with Friends and Family: More than three times a week    Frequency of Social Gatherings with Friends and Family: More than three times a week    Attends Religious Services: 1 to 4 times per year    Active Member of [employer] or Organizations: Yes    Attends Banker Meetings: 1 to 4 times per year    Marital Status: Widowed  Intimate Partner Violence: Patient Unable To Answer (09/20/2024)   Epic    Fear of Current or Ex-Partner: Patient unable to answer    Emotionally Abused: Patient unable to answer    Physically Abused: Patient unable to answer    Sexually Abused: Patient unable to answer  Depression (PHQ2-9): High Risk (11/01/2024)   Depression (PHQ2-9)    PHQ-2 Score: 16  Alcohol Screen: Low Risk (10/22/2023)   Alcohol Screen    Last Alcohol Screening Score (AUDIT): 0  Housing: Unknown (09/20/2024)   Epic    Unable to Pay for Housing in the Last Year: No    Number of Times Moved in the Last Year: Not on file    Homeless in the Last Year: No  Recent Concern: Housing - High Risk (09/14/2024)   Epic    Unable to Pay  for Housing in the Last Year: Yes    Number of Times Moved in the Last Year: 0    Homeless in the Last Year: No  Utilities: Not At Risk (09/20/2024)   Epic    Threatened with loss of utilities: No  Recent Concern: Utilities - At Risk (09/14/2024)   Epic    Threatened with loss of utilities: Yes  Health Literacy: Adequate Health Literacy (10/22/2023)   B1300 Health Literacy    Frequency of need for help with medical instructions: Never    FAMILY HISTORY: Family History  Problem Relation Age of Onset   Colon polyps Sister 28   Diabetes Sister    Hypertension Sister    Kidney disease Sister  Arthritis Father    Cancer Father        prostate    Hypertension Father    Diabetes Sister    Hypertension Sister    CAD Paternal Grandmother    Colon cancer Neg Hx     ALLERGIES:  is allergic to farxiga  [dapagliflozin ] and jardiance [empagliflozin].  MEDICATIONS:  Current Outpatient Medications  Medication Sig Dispense Refill   acetaminophen  (TYLENOL ) 650 MG CR tablet Take 650 mg by mouth every 8 (eight) hours as needed for pain.     albuterol  (VENTOLIN  HFA) 108 (90 Base) MCG/ACT inhaler Inhale 2 puffs into the lungs every 6 (six) hours as needed for wheezing or shortness of breath. 8 g 0   amLODipine  (NORVASC ) 10 MG tablet Take 10 mg by mouth daily.     aspirin  81 MG chewable tablet Chew 1 tablet (81 mg total) by mouth 2 (two) times daily. 35 tablet 0   buPROPion  (WELLBUTRIN  XL) 150 MG 24 hr tablet Take 1 tablet (150 mg total) by mouth daily. 30 tablet 3   busPIRone  (BUSPAR ) 30 MG tablet Take one tablet by mouth two times daily 60 tablet 3   chlorthalidone  (HYGROTON ) 25 MG tablet Take 25 mg by mouth daily.     ferrous gluconate  (FERGON) 324 MG tablet Take 324 mg by mouth daily with breakfast.     FLUoxetine  (PROZAC ) 40 MG capsule Take 1 capsule (40 mg total) by mouth daily. 90 capsule 11   gabapentin  (NEURONTIN ) 300 MG capsule Take 1 capsule (300 mg total) by mouth 2 (two) times  daily. 60 capsule 3   methocarbamol  (ROBAXIN ) 500 MG tablet Take 1 tablet (500 mg total) by mouth every 6 (six) hours as needed for muscle spasms. 40 tablet 1   Multiple Vitamin (MULTIVITAMIN) tablet Take 1 tablet by mouth daily.     olmesartan -hydrochlorothiazide  (BENICAR  HCT) 20-12.5 MG tablet TAKE ONE TABLET BY MOUTH EVERY DAY 30 tablet 11   oxyCODONE  (OXY IR/ROXICODONE ) 5 MG immediate release tablet Take 1 tablet (5 mg total) by mouth every 4 (four) hours as needed for moderate pain (pain score 4-6) (pain score 4-6). 30 tablet 0   polyethylene glycol (MIRALAX  / GLYCOLAX ) 17 g packet Take 17 g by mouth daily as needed for moderate constipation.     rosuvastatin  (CRESTOR ) 20 MG tablet TAKE 1 TABLET BY MOUTH EVERY DAY *NEW PRESCRIPTION REQUEST* (Patient taking differently: Take 20 mg by mouth daily.) 90 tablet 11   sucralfate  (CARAFATE ) 1 g tablet TAKE ONE (1) TABLET BY MOUTH TWICE DAILY *NEW PRESCRIPTION REQUEST* (Patient taking differently: Take 1 g by mouth 2 (two) times daily.) 180 tablet 11   traZODone  (DESYREL ) 50 MG tablet Take 0.5-1 tablets (25-50 mg total) by mouth at bedtime as needed for sleep. (Patient taking differently: Take 25-50 mg by mouth at bedtime.) 30 tablet 1   No current facility-administered medications for this visit.    REVIEW OF SYSTEMS:   Constitutional: ( - ) fevers, ( - )  chills , ( - ) night sweats Eyes: ( - ) blurriness of vision, ( - ) double vision, ( - ) watery eyes Ears, nose, mouth, throat, and face: ( - ) mucositis, ( - ) sore throat Respiratory: ( - ) cough, ( - ) dyspnea, ( - ) wheezes Cardiovascular: ( - ) palpitation, ( - ) chest discomfort, ( - ) lower extremity swelling Gastrointestinal:  ( - ) nausea, ( - ) heartburn, ( - ) change in bowel habits Skin: ( - )  abnormal skin rashes Lymphatics: ( - ) new lymphadenopathy, ( - ) easy bruising Neurological: ( - ) numbness, ( - ) tingling, ( - ) new weaknesses Behavioral/Psych: ( - ) mood change, ( - )  new changes  All other systems were reviewed with the patient and are negative.  PHYSICAL EXAMINATION: ECOG PERFORMANCE STATUS: 1 - Symptomatic but completely ambulatory  Vitals:   11/01/24 1426  BP: 129/62  Pulse: 74  Resp: 16  Temp: 98.1 F (36.7 C)  SpO2: 99%   Filed Weights   11/01/24 1426  Weight: 175 lb 3.2 oz (79.5 kg)    GENERAL: well appearing female in NAD  SKIN: skin color, texture, turgor are normal, no rashes or significant lesions EYES: conjunctiva are pink and non-injected, sclera clear OROPHARYNX: no exudate, no erythema; lips, buccal mucosa, and tongue normal  NECK: supple, non-tender LYMPH:  no palpable lymphadenopathy in the cervical or supraclavicular lymph nodes.  LUNGS: clear to auscultation and percussion with normal breathing effort HEART: regular rate & rhythm and no murmurs and no lower extremity edema ABDOMEN: soft, non-tender, non-distended, normal bowel sounds Musculoskeletal: no cyanosis of digits and no clubbing  PSYCH: alert & oriented x 3, fluent speech NEURO: no focal motor/sensory deficits  LABORATORY DATA:  I have reviewed the data as listed    Latest Ref Rng & Units 10/27/2024    8:52 AM 10/22/2024    7:15 PM 09/16/2024    3:51 AM  CBC  WBC 4.0 - 10.5 K/uL 6.0  6.6  7.1   Hemoglobin 12.0 - 15.0 g/dL 9.8  89.3  8.2   Hematocrit 36.0 - 46.0 % 31.7  34.1  25.8   Platelets 150 - 400 K/uL 285  220  162        Latest Ref Rng & Units 10/27/2024    8:52 AM 10/22/2024    7:15 PM 09/15/2024   11:20 AM  CMP  Glucose 70 - 99 mg/dL 86  891  872   BUN 8 - 23 mg/dL 32  30  23   Creatinine 0.44 - 1.00 mg/dL 8.74  8.48  8.81   Sodium 135 - 145 mmol/L 144  137  136   Potassium 3.5 - 5.1 mmol/L 3.5  2.9  3.1   Chloride 98 - 111 mmol/L 103  96  94   CO2 22 - 32 mmol/L 26  24  27    Calcium  8.9 - 10.3 mg/dL 9.9  9.7  8.5   Total Protein 6.5 - 8.1 g/dL 7.3  7.8    Total Bilirubin 0.0 - 1.2 mg/dL <9.7  0.3    Alkaline Phos 38 - 126 U/L 82  86     AST 15 - 41 U/L 24  26    ALT 0 - 44 U/L 22  13      RADIOGRAPHIC STUDIES: I have personally reviewed the radiological images as listed and agreed with the findings in the report. CT ABDOMEN PELVIS W CONTRAST Result Date: 10/22/2024 EXAM: CT ABDOMEN AND PELVIS WITH CONTRAST 10/22/2024 08:13:17 PM TECHNIQUE: CT of the abdomen and pelvis was performed with the administration of intravenous contrast. 75 mL of iohexol  (OMNIPAQUE ) 300 MG/ML solution was administered. Multiplanar reformatted images are provided for review. Automated exposure control, iterative reconstruction, and/or weight-based adjustment of the mA/kV was utilized to reduce the radiation dose to as low as reasonably achievable. COMPARISON: 07/13/2024 CLINICAL HISTORY: RLQ abdominal pain. FINDINGS: LOWER CHEST: Scarring in the right lung base. LIVER: The  liver is unremarkable. GALLBLADDER AND BILE DUCTS: Gallbladder is unremarkable. No biliary ductal dilatation. SPLEEN: No acute abnormality. PANCREAS: No acute abnormality. ADRENAL GLANDS: No acute abnormality. KIDNEYS, URETERS AND BLADDER: No stones in the kidneys or ureters. No hydronephrosis. No perinephric or periureteral stranding. Urinary bladder is decompressed and obscured by beam hardening artifact from right hip replacement. GI AND BOWEL: Stomach demonstrates no acute abnormality. There is no bowel obstruction. PERITONEUM AND RETROPERITONEUM: No ascites. No free air. VASCULATURE: Aorta is normal in caliber. Aortic atherosclerosis. LYMPH NODES: No lymphadenopathy. REPRODUCTIVE ORGANS: No acute abnormality. BONES AND SOFT TISSUES: Right hip replacement. No acute osseous abnormality. No focal soft tissue abnormality. IMPRESSION: 1. No acute findings in the abdomen or pelvis. Electronically signed by: Franky Crease MD 10/22/2024 08:30 PM EST RP Workstation: [id number]   DG Hip Unilat W or Wo Pelvis 2-3 Views Right Result Date: 10/22/2024 CLINICAL DATA:  Right hip pain EXAM: DG HIP (WITH OR  WITHOUT PELVIS) 2-3V RIGHT COMPARISON:  09/14/2024 FINDINGS: Frontal view of the pelvis as well as frontal and frogleg lateral views of the right hip are obtained. Right hip arthroplasty is identified in the expected position without evidence of acute complication. There are no acute displaced fractures. Mild left hip osteoarthritis. IMPRESSION: 1. No acute displaced fracture. 2. Unremarkable right hip arthroplasty Electronically Signed   By: Ozell Daring M.D.   On: 10/22/2024 19:09    ASSESSMENT & PLAN Sophia [name] is a 73 y.o. female who presents to the clinic for continued management of stage 1b non-small cell lung cancer.   #Stage 1b non-small cell lung cancer: --S/p right lobectomy with final stage of 1b due to residual pleural invasion. Currently on active surveillance --Did not meet crieteria for chemotherapy, targeted therapy or adjuvant radiation --NCCN guidelines for surveillance includes H&P and chest CT with or without contrast every 6 months for 2 to 3 years, then H&P and and low-dose noncontrast enhanced chest CT annually. --Labs from 10/27/2024 reviewed. WBC 6.0, Hgb 9.8, Plt 285, creatinine stable at 1.25, LFTs normal. --Due for repeat CT chest, scheduled for 11/15/24  #Normocytic anemia: ---Previous workup from 05/03/24 showed no evidence of paraproteinemia, vitamin B12 deficiency, copper  deficiency or folate deficiency. There was slight iron  deficiency with iron  saturation <20% --Last endoscopy 03/07/2021: Showed gastritis --Last colonoscopy: 12/07/2020: No active bleeding --Not currently taking PO iron  replacement.  --Iron  studies from 10/27/2024 showed ferritin 194, saturation 12%, iron  32, TIBC 267 --Will arrange for IV venofer 200 mg weekly x 5 doses to boost iron  levels. Likely had some blood loss with recent hip surgery.   #CKD: --Likely secondary to hypertension.  . - Consider referral to nephrologist if kidney function worsens.  #Pericardial effusion: --Presence  of pericardial effusion noted on CT scan, asymptomatic.  Follow up: --IV venofer 200 mg weekly x 5 doses --CT chest next week --RTC in 3 months with labs and APP visit  Orders Placed This Encounter  Procedures   CT Chest W Contrast    Standing Status:   Future    Expected Date:   11/08/2024    Expiration Date:   11/01/2025    If indicated for the ordered procedure, I authorize the administration of contrast media per Radiology protocol:   Yes    Does the patient have a contrast media/X-ray dye allergy?:   No    Preferred imaging location?:   [facility]    All questions were answered. The patient knows to call the clinic with any problems, questions  or concerns.  I have spent a total of 30 minutes minutes of face-to-face and non-face-to-face time, preparing to see the patient,performing a medically appropriate examination, counseling and educating the patient, ordering medications/tests/procedures, documenting clinical information in the electronic health record, independently interpreting results and communicating results to the patient, and care coordination.   Johnston Police, PA-C Department of Hematology/Oncology [facility]

## 2024-11-08 ENCOUNTER — Inpatient Hospital Stay

## 2024-11-08 VITALS — BP 122/72 | HR 63 | Temp 97.7°F | Resp 18

## 2024-11-08 DIAGNOSIS — D509 Iron deficiency anemia, unspecified: Secondary | ICD-10-CM | POA: Diagnosis not present

## 2024-11-08 MED ORDER — IRON SUCROSE 200 MG IVPB - SIMPLE MED
200.0000 mg | Freq: Once | Status: AC
  Administered 2024-11-08: 200 mg via INTRAVENOUS
  Filled 2024-11-08: qty 200

## 2024-11-08 MED ORDER — CETIRIZINE HCL 10 MG PO TABS
10.0000 mg | ORAL_TABLET | Freq: Once | ORAL | Status: AC
  Administered 2024-11-08: 10 mg via ORAL
  Filled 2024-11-08: qty 1

## 2024-11-08 MED ORDER — SODIUM CHLORIDE 0.9 % IV SOLN: INTRAVENOUS | Status: DC

## 2024-11-08 NOTE — Patient Instructions (Signed)
 [facility] [place] - A DEPT OF [facility] [place]  Discharge Instructions: Thank you for choosing [facility] to provide your oncology and hematology care.  If you have a lab appointment with the Cancer Center - please note that after April 8th, 2024, all labs will be drawn in the cancer center.  You do not have to check in or register with the main entrance as you have in the past but will complete your check-in in the cancer center.  Wear comfortable clothing and clothing appropriate for easy access to any Portacath or PICC line.   We strive to give you quality time with your provider. You may need to reschedule your appointment if you arrive late (15 or more minutes).  Arriving late affects you and other patients whose appointments are after yours.  Also, if you miss three or more appointments without notifying the office, you may be dismissed from the clinic at the provider's discretion.      For prescription refill requests, have your pharmacy contact our office and allow 72 hours for refills to be completed.    Today you received Venofer IV iron infusion.     BELOW ARE SYMPTOMS THAT SHOULD BE REPORTED IMMEDIATELY: *FEVER GREATER THAN 100.4 F (38 C) OR HIGHER *CHILLS OR SWEATING *NAUSEA AND VOMITING THAT IS NOT CONTROLLED WITH YOUR NAUSEA MEDICATION *UNUSUAL SHORTNESS OF BREATH *UNUSUAL BRUISING OR BLEEDING *URINARY PROBLEMS (pain or burning when urinating, or frequent urination) *BOWEL PROBLEMS (unusual diarrhea, constipation, pain near the anus) TENDERNESS IN MOUTH AND THROAT WITH OR WITHOUT PRESENCE OF ULCERS (sore throat, sores in mouth, or a toothache) UNUSUAL RASH, SWELLING OR PAIN  UNUSUAL VAGINAL DISCHARGE OR ITCHING   Items with * indicate a potential emergency and should be followed up as soon as possible or go to the Emergency Department if any problems should occur.  Please show the CHEMOTHERAPY ALERT CARD or IMMUNOTHERAPY ALERT CARD at  check-in to the Emergency Department and triage nurse.  Should you have questions after your visit or need to cancel or reschedule your appointment, please contact [facility]  - A DEPT OF [facility] [phone]  and follow the prompts.  Office hours are 8:00 a.m. to 4:30 p.m. Monday - Friday. Please note that voicemails left after 4:00 p.m. may not be returned until the following business day.  We are closed weekends and major holidays. You have access to a nurse at all times for urgent questions. Please call the main number to the clinic [phone] and follow the prompts.  For any non-urgent questions, you may also contact your provider using MyChart. We now offer e-Visits for anyone 13 and older to request care online for non-urgent symptoms. For details visit [email or web address].   Also download the MyChart app! Go to the app store, search "MyChart", open the app, select [place], and log in with your MyChart username and password.

## 2024-11-08 NOTE — Progress Notes (Signed)
 Patient presents today for iron  infusion.  Patient is in satisfactory condition with no new complaints voiced.  Vital signs are stable.  We will proceed with infusion per provider orders.    Patient took 1,000 mg Tylenol  at home prior to arrival. Peripheral IV started with good blood return pre and post infusion.  Venofer  200 mg given today per MD orders. Tolerated infusion without adverse affects. Vital signs stable. No complaints at this time. Discharged from clinic ambulatory with cane in stable condition. Alert and oriented x 3. F/U with [facility] as scheduled.

## 2024-11-15 ENCOUNTER — Ambulatory Visit (HOSPITAL_BASED_OUTPATIENT_CLINIC_OR_DEPARTMENT_OTHER): Admission: RE | Admit: 2024-11-15 | Source: Ambulatory Visit

## 2024-11-15 ENCOUNTER — Encounter: Payer: Self-pay | Admitting: *Deleted

## 2024-11-19 ENCOUNTER — Other Ambulatory Visit: Payer: Self-pay | Admitting: Family Medicine

## 2024-11-19 ENCOUNTER — Inpatient Hospital Stay: Attending: Oncology

## 2024-11-19 VITALS — BP 106/66 | HR 55 | Temp 97.1°F | Resp 17

## 2024-11-19 DIAGNOSIS — Z85118 Personal history of other malignant neoplasm of bronchus and lung: Secondary | ICD-10-CM | POA: Insufficient documentation

## 2024-11-19 DIAGNOSIS — D509 Iron deficiency anemia, unspecified: Secondary | ICD-10-CM | POA: Diagnosis present

## 2024-11-19 MED ORDER — CETIRIZINE HCL 10 MG PO TABS
10.0000 mg | ORAL_TABLET | Freq: Once | ORAL | Status: AC
  Administered 2024-11-19: 10 mg via ORAL
  Filled 2024-11-19: qty 1

## 2024-11-19 MED ORDER — IRON SUCROSE 200 MG IVPB - SIMPLE MED
200.0000 mg | Freq: Once | Status: AC
  Administered 2024-11-19: 200 mg via INTRAVENOUS
  Filled 2024-11-19: qty 200

## 2024-11-19 MED ORDER — SODIUM CHLORIDE 0.9 % IV SOLN: INTRAVENOUS | Status: DC

## 2024-11-19 MED ORDER — ACETAMINOPHEN 325 MG PO TABS
650.0000 mg | ORAL_TABLET | Freq: Once | ORAL | Status: AC
  Administered 2024-11-19: 650 mg via ORAL
  Filled 2024-11-19: qty 2

## 2024-11-19 NOTE — Progress Notes (Signed)
Venofer iron infusion given per orders. Patient tolerated it well without problems. Vitals stable and discharged home from clinic ambulatory. Follow up as scheduled.

## 2024-11-19 NOTE — Patient Instructions (Signed)
 [facility] [place] - A DEPT OF [facility]  Discharge Instructions: Thank you for choosing [facility] to provide your oncology and hematology care.  If you have a lab appointment with the Cancer Center - please note that after April 8th, 2024, all labs will be drawn in the cancer center.  You do not have to check in or register with the main entrance as you have in the past but will complete your check-in in the cancer center.  Wear comfortable clothing and clothing appropriate for easy access to any Portacath or PICC line.   We strive to give you quality time with your provider. You may need to reschedule your appointment if you arrive late (15 or more minutes).  Arriving late affects you and other patients whose appointments are after yours.  Also, if you miss three or more appointments without notifying the office, you may be dismissed from the clinic at the provider's discretion.      For prescription refill requests, have your pharmacy contact our office and allow 72 hours for refills to be completed.    Today you received the following iron infusion : Venofer 200 mg     To help prevent nausea and vomiting after your treatment, we encourage you to take your nausea medication as directed.  BELOW ARE SYMPTOMS THAT SHOULD BE REPORTED IMMEDIATELY: *FEVER GREATER THAN 100.4 F (38 C) OR HIGHER *CHILLS OR SWEATING *NAUSEA AND VOMITING THAT IS NOT CONTROLLED WITH YOUR NAUSEA MEDICATION *UNUSUAL SHORTNESS OF BREATH *UNUSUAL BRUISING OR BLEEDING *URINARY PROBLEMS (pain or burning when urinating, or frequent urination) *BOWEL PROBLEMS (unusual diarrhea, constipation, pain near the anus) TENDERNESS IN MOUTH AND THROAT WITH OR WITHOUT PRESENCE OF ULCERS (sore throat, sores in mouth, or a toothache) UNUSUAL RASH, SWELLING OR PAIN  UNUSUAL VAGINAL DISCHARGE OR ITCHING   Items with * indicate a potential emergency and should be followed up as soon as possible  or go to the Emergency Department if any problems should occur.  Please show the CHEMOTHERAPY ALERT CARD or IMMUNOTHERAPY ALERT CARD at check-in to the Emergency Department and triage nurse.  Should you have questions after your visit or need to cancel or reschedule your appointment, please contact [facility]  - A DEPT OF [name] [phone]  and follow the prompts.  Office hours are 8:00 a.m. to 4:30 p.m. Monday - Friday. Please note that voicemails left after 4:00 p.m. may not be returned until the following business day.  We are closed weekends and major holidays. You have access to a nurse at all times for urgent questions. Please call the main number to the clinic [phone] and follow the prompts.  For any non-urgent questions, you may also contact your provider using MyChart. We now offer e-Visits for anyone 87 and older to request care online for non-urgent symptoms. For details visit [email or web address].   Also download the MyChart app! Go to the app store, search MyChart, open the app, select [place], and log in with your MyChart username and password.

## 2024-11-24 ENCOUNTER — Ambulatory Visit: Admitting: Family Medicine

## 2024-11-26 ENCOUNTER — Inpatient Hospital Stay

## 2024-11-26 VITALS — BP 111/66 | HR 69 | Temp 97.0°F | Resp 18

## 2024-11-26 DIAGNOSIS — D509 Iron deficiency anemia, unspecified: Secondary | ICD-10-CM

## 2024-11-26 MED ORDER — ACETAMINOPHEN 325 MG PO TABS
650.0000 mg | ORAL_TABLET | Freq: Once | ORAL | Status: AC
  Administered 2024-11-26: 650 mg via ORAL
  Filled 2024-11-26: qty 2

## 2024-11-26 MED ORDER — SODIUM CHLORIDE 0.9 % IV SOLN: INTRAVENOUS | Status: DC

## 2024-11-26 MED ORDER — IRON SUCROSE 200 MG IVPB - SIMPLE MED
200.0000 mg | Freq: Once | Status: AC
  Administered 2024-11-26: 200 mg via INTRAVENOUS
  Filled 2024-11-26: qty 200

## 2024-11-26 MED ORDER — CETIRIZINE HCL 10 MG PO TABS
10.0000 mg | ORAL_TABLET | Freq: Once | ORAL | Status: AC
  Administered 2024-11-26: 10 mg via ORAL
  Filled 2024-11-26: qty 1

## 2024-11-26 NOTE — Patient Instructions (Signed)

## 2024-11-26 NOTE — Progress Notes (Signed)
 Patient presents today for venofer  infusion per providers order.  Vital signs WNL.  Patient has no new complaints at this time.    Peripheral IV started and blood return noted pre and post infusion.  Stable during infusion without adverse affects.  Vital signs stable.  No complaints at this time.  Discharge from clinic ambulatory in stable condition.  Alert and oriented X 3.  Follow up with [facility] as scheduled.

## 2024-11-29 ENCOUNTER — Encounter: Admitting: Physician Assistant

## 2024-11-30 ENCOUNTER — Ambulatory Visit: Admitting: Thoracic Surgery (Cardiothoracic Vascular Surgery)

## 2024-12-02 ENCOUNTER — Ambulatory Visit: Payer: Self-pay | Admitting: Family Medicine

## 2024-12-03 ENCOUNTER — Inpatient Hospital Stay

## 2024-12-03 VITALS — BP 128/64 | HR 59 | Temp 97.7°F | Resp 17

## 2024-12-03 DIAGNOSIS — D509 Iron deficiency anemia, unspecified: Secondary | ICD-10-CM

## 2024-12-03 MED ORDER — SODIUM CHLORIDE 0.9 % IV SOLN: INTRAVENOUS | Status: DC

## 2024-12-03 MED ORDER — CETIRIZINE HCL 10 MG PO TABS
10.0000 mg | ORAL_TABLET | Freq: Once | ORAL | Status: AC
  Administered 2024-12-03: 10 mg via ORAL
  Filled 2024-12-03: qty 1

## 2024-12-03 MED ORDER — ACETAMINOPHEN 325 MG PO TABS
650.0000 mg | ORAL_TABLET | Freq: Once | ORAL | Status: AC
  Administered 2024-12-03: 650 mg via ORAL
  Filled 2024-12-03: qty 2

## 2024-12-03 MED ORDER — IRON SUCROSE 200 MG IVPB - SIMPLE MED
200.0000 mg | Freq: Once | Status: AC
  Administered 2024-12-03: 200 mg via INTRAVENOUS
  Filled 2024-12-03: qty 200

## 2024-12-03 NOTE — Progress Notes (Signed)
 Patient tolerated iron infusion with no complaints voiced.  Peripheral IV site clean and dry with good blood return noted before and after infusion.  Band aid applied.  VSS with discharge and left in satisfactory condition with no s/s of distress noted.

## 2024-12-03 NOTE — Patient Instructions (Signed)
 [facility] [place] - A DEPT OF [place]  Discharge Instructions: Thank you for choosing [facility] to provide your oncology and hematology care.  If you have a lab appointment with the Cancer Center - please note that after April 8th, 2024, all labs will be drawn in the cancer center.  You do not have to check in or register with the main entrance as you have in the past but will complete your check-in in the cancer center.  Wear comfortable clothing and clothing appropriate for easy access to any Portacath or PICC line.   We strive to give you quality time with your provider. You may need to reschedule your appointment if you arrive late (15 or more minutes).  Arriving late affects you and other patients whose appointments are after yours.  Also, if you miss three or more appointments without notifying the office, you may be dismissed from the clinic at the provider's discretion.      For prescription refill requests, have your pharmacy contact our office and allow 72 hours for refills to be completed.    Today you received the following IV venofer, return as scheduled.   To help prevent nausea and vomiting after your treatment, we encourage you to take your nausea medication as directed.  BELOW ARE SYMPTOMS THAT SHOULD BE REPORTED IMMEDIATELY: *FEVER GREATER THAN 100.4 F (38 C) OR HIGHER *CHILLS OR SWEATING *NAUSEA AND VOMITING THAT IS NOT CONTROLLED WITH YOUR NAUSEA MEDICATION *UNUSUAL SHORTNESS OF BREATH *UNUSUAL BRUISING OR BLEEDING *URINARY PROBLEMS (pain or burning when urinating, or frequent urination) *BOWEL PROBLEMS (unusual diarrhea, constipation, pain near the anus) TENDERNESS IN MOUTH AND THROAT WITH OR WITHOUT PRESENCE OF ULCERS (sore throat, sores in mouth, or a toothache) UNUSUAL RASH, SWELLING OR PAIN  UNUSUAL VAGINAL DISCHARGE OR ITCHING   Items with * indicate a potential emergency and should be followed up as soon as possible  or go to the Emergency Department if any problems should occur.  Please show the CHEMOTHERAPY ALERT CARD or IMMUNOTHERAPY ALERT CARD at check-in to the Emergency Department and triage nurse.  Should you have questions after your visit or need to cancel or reschedule your appointment, please contact [facility] [place] - A DEPT OF [facility] [phone]  and follow the prompts.  Office hours are 8:00 a.m. to 4:30 p.m. Monday - Friday. Please note that voicemails left after 4:00 p.m. may not be returned until the following business day.  We are closed weekends and major holidays. You have access to a nurse at all times for urgent questions. Please call the main number to the clinic [phone] and follow the prompts.  For any non-urgent questions, you may also contact your provider using MyChart. We now offer e-Visits for anyone 14 and older to request care online for non-urgent symptoms. For details visit [email or web address].   Also download the MyChart app! Go to the app store, search "MyChart", open the app, select [place], and log in with your MyChart username and password.

## 2024-12-09 ENCOUNTER — Other Ambulatory Visit: Payer: Self-pay | Admitting: Family Medicine

## 2024-12-10 ENCOUNTER — Inpatient Hospital Stay

## 2024-12-10 VITALS — BP 109/69 | HR 59 | Temp 97.9°F | Resp 18

## 2024-12-10 DIAGNOSIS — D509 Iron deficiency anemia, unspecified: Secondary | ICD-10-CM | POA: Diagnosis not present

## 2024-12-10 MED ORDER — CETIRIZINE HCL 10 MG PO TABS
10.0000 mg | ORAL_TABLET | Freq: Once | ORAL | Status: AC
  Administered 2024-12-10: 10 mg via ORAL
  Filled 2024-12-10: qty 1

## 2024-12-10 MED ORDER — ACETAMINOPHEN 325 MG PO TABS
650.0000 mg | ORAL_TABLET | Freq: Once | ORAL | Status: AC
  Administered 2024-12-10: 650 mg via ORAL
  Filled 2024-12-10: qty 2

## 2024-12-10 MED ORDER — IRON SUCROSE 200 MG IVPB - SIMPLE MED
200.0000 mg | Freq: Once | Status: AC
  Administered 2024-12-10: 200 mg via INTRAVENOUS
  Filled 2024-12-10: qty 200

## 2024-12-10 MED ORDER — SODIUM CHLORIDE 0.9 % IV SOLN: INTRAVENOUS | Status: DC

## 2024-12-10 NOTE — Progress Notes (Signed)
 Patient presents today for Venofer  200 mg iron  infusion. Patient denies any side effects related to her last iron  infusion.   Venofer  given today per MD orders. Tolerated infusion without adverse affects. Vital signs stable. No complaints at this time. Discharged from clinic ambulatory in stable condition. Alert and oriented x 3. F/U with [facility] as scheduled.

## 2024-12-10 NOTE — Patient Instructions (Signed)
 [facility] [place] - A DEPT OF [place]  Discharge Instructions: Thank you for choosing [facility] to provide your oncology and hematology care.  If you have a lab appointment with the Cancer Center - please note that after April 8th, 2024, all labs will be drawn in the cancer center.  You do not have to check in or register with the main entrance as you have in the past but will complete your check-in in the cancer center.  Wear comfortable clothing and clothing appropriate for easy access to any Portacath or PICC line.   We strive to give you quality time with your provider. You may need to reschedule your appointment if you arrive late (15 or more minutes).  Arriving late affects you and other patients whose appointments are after yours.  Also, if you miss three or more appointments without notifying the office, you may be dismissed from the clinic at the provider's discretion.      For prescription refill requests, have your pharmacy contact our office and allow 72 hours for refills to be completed.    Today you received the following chemotherapy and/or immunotherapy agents Venofer  200 mg Iron  Sucrose Injection What is this medication? IRON  SUCROSE (EYE ern SOO krose) treats low levels of iron  (iron  deficiency anemia) in people with kidney disease. Iron  is a mineral that plays an important role in making red blood cells, which carry oxygen from your lungs to the rest of your body. This medicine may be used for other purposes; ask your health care provider or pharmacist if you have questions. COMMON BRAND NAME(S): Venofer  What should I tell my care team before I take this medication? They need to know if you have any of these conditions: Anemia not caused by low iron  levels Heart disease High levels of iron  in the blood Kidney disease Liver disease An unusual or allergic reaction to iron , other medications, foods, dyes, or preservatives Pregnant  or trying to get pregnant Breastfeeding How should I use this medication? This medication is infused into a vein. It is given by your care team in a hospital or clinic setting. Talk to your care team about the use of this medication in children. While it may be prescribed for children as young as 2 years for selected conditions, precautions do apply. Overdosage: If you think you have taken too much of this medicine contact a poison control center or emergency room at once. NOTE: This medicine is only for you. Do not share this medicine with others. What if I miss a dose? Keep appointments for follow-up doses. It is important not to miss your dose. Call your care team if you are unable to keep an appointment. What may interact with this medication? Do not take this medication with any of the following: Deferoxamine Dimercaprol Other iron  products This medication may also interact with the following: Chloramphenicol Deferasirox This list may not describe all possible interactions. Give your health care provider a list of all the medicines, herbs, non-prescription drugs, or dietary supplements you use. Also tell them if you smoke, drink alcohol, or use illegal drugs. Some items may interact with your medicine. What should I watch for while using this medication? Visit your care team for regular checks on your progress. Tell your care team if your symptoms do not start to get better or if they get worse. You may need blood work done while you are taking this medication. You may need  to eat more foods that contain iron . Talk to your care team. Foods that contain iron  include whole grains or cereals, dried fruits, beans, peas, leafy green vegetables, and organ meats (liver, kidney). What side effects may I notice from receiving this medication? Side effects that you should report to your care team as soon as possible: Allergic reactions--skin rash, itching, hives, swelling of the face, lips,  tongue, or throat Low blood pressure--dizziness, feeling faint or lightheaded, blurry vision Shortness of breath Side effects that usually do not require medical attention (report to your care team if they continue or are bothersome): Flushing Headache Joint pain Muscle pain Nausea Pain, redness, or irritation at injection site This list may not describe all possible side effects. Call your doctor for medical advice about side effects. You may report side effects to FDA at 1-800-FDA-1088. Where should I keep my medication? This medication is given in a hospital or clinic. It will not be stored at home. NOTE: This sheet is a summary. It may not cover all possible information. If you have questions about this medicine, talk to your doctor, pharmacist, or health care provider.  2024 Elsevier/Gold Standard (2023-06-25 00:00:00)      To help prevent nausea and vomiting after your treatment, we encourage you to take your nausea medication as directed.  BELOW ARE SYMPTOMS THAT SHOULD BE REPORTED IMMEDIATELY: *FEVER GREATER THAN 100.4 F (38 C) OR HIGHER *CHILLS OR SWEATING *NAUSEA AND VOMITING THAT IS NOT CONTROLLED WITH YOUR NAUSEA MEDICATION *UNUSUAL SHORTNESS OF BREATH *UNUSUAL BRUISING OR BLEEDING *URINARY PROBLEMS (pain or burning when urinating, or frequent urination) *BOWEL PROBLEMS (unusual diarrhea, constipation, pain near the anus) TENDERNESS IN MOUTH AND THROAT WITH OR WITHOUT PRESENCE OF ULCERS (sore throat, sores in mouth, or a toothache) UNUSUAL RASH, SWELLING OR PAIN  UNUSUAL VAGINAL DISCHARGE OR ITCHING   Items with * indicate a potential emergency and should be followed up as soon as possible or go to the Emergency Department if any problems should occur.  Please show the CHEMOTHERAPY ALERT CARD or IMMUNOTHERAPY ALERT CARD at check-in to the Emergency Department and triage nurse.  Should you have questions after your visit or need to cancel or reschedule your appointment,  please contact [facility] [place] - A DEPT OF [name] [phone]  and follow the prompts.  Office hours are 8:00 a.m. to 4:30 p.m. Monday - Friday. Please note that voicemails left after 4:00 p.m. may not be returned until the following business day.  We are closed weekends and major holidays. You have access to a nurse at all times for urgent questions. Please call the main number to the clinic [phone] and follow the prompts.  For any non-urgent questions, you may also contact your provider using MyChart. We now offer e-Visits for anyone 80 and older to request care online for non-urgent symptoms. For details visit [email or web address].   Also download the MyChart app! Go to the app store, search MyChart, open the app, select [place], and log in with your MyChart username and password.

## 2024-12-13 ENCOUNTER — Encounter: Admitting: Physician Assistant

## 2024-12-13 ENCOUNTER — Ambulatory Visit (HOSPITAL_COMMUNITY)

## 2024-12-15 ENCOUNTER — Ambulatory Visit (HOSPITAL_COMMUNITY)
Admission: RE | Admit: 2024-12-15 | Discharge: 2024-12-15 | Disposition: A | Discharge status: Home / Self Care | Source: Ambulatory Visit | Attending: Physician Assistant | Admitting: Physician Assistant

## 2024-12-15 DIAGNOSIS — C3491 Malignant neoplasm of unspecified part of right bronchus or lung: Secondary | ICD-10-CM | POA: Diagnosis present

## 2024-12-15 LAB — POCT I-STAT CREATININE: Creatinine, Ser: 1.2 mg/dL — ABNORMAL HIGH (ref 0.44–1.00)

## 2024-12-15 MED ORDER — IOHEXOL 300 MG/ML  SOLN
80.0000 mL | Freq: Once | INTRAMUSCULAR | Status: AC | PRN
  Administered 2024-12-15: 80 mL via INTRAVENOUS

## 2024-12-15 NOTE — Progress Notes (Signed)
 Evaluation after Contrast Extravasation  Patient seen and examined immediately after contrast extravasation while in CT. Right forearm PIV removed and pressure dressing applied prior to arrival. Per CT tech ~75 cc Omnipaque  300 extravasation. Per patient she feels fine, no pain at contrast extravasation site or other concerns.  Exam: There is minimal swelling at the right forearm area (radial side). There is no pain. There is no erythema. There is no discoloration. There are no blisters. There are no signs of decreased perfusion of the skin.  The arm and hand are warm to touch.  The patient has full ROM in fingers.  Radial pulse strongly palpable Sensation intact.  Discussed with patient monitoring at home for:   - increase in pain or swelling - changed or altered sensation - ulceration or blistering - increasing redness - warmth or increasing firmness - decreased tissue perfusion as noted by decreased capillary refill or discoloration of skin - decreased pulses peripheral to site  She was instructed to call radiology or present to the ED if she notes any of the above. She understands and is agreeable top this plan.  Sophia [name] 12/15/2024 1:57 PM

## 2024-12-16 ENCOUNTER — Ambulatory Visit: Payer: Self-pay | Admitting: *Deleted

## 2024-12-16 NOTE — Progress Notes (Signed)
 Patient's niece [name] a made aware of results.  Note sent to Dr. Mallipeddi for review.

## 2024-12-16 NOTE — Progress Notes (Incomplete)
 Called pt at 1220 on 12/16/24, per pt arm is much better and is having no problems. WJ

## 2024-12-20 ENCOUNTER — Other Ambulatory Visit: Payer: Self-pay | Admitting: Family Medicine

## 2024-12-21 ENCOUNTER — Ambulatory Visit: Admitting: Thoracic Surgery (Cardiothoracic Vascular Surgery)

## 2024-12-21 DIAGNOSIS — C3491 Malignant neoplasm of unspecified part of right bronchus or lung: Secondary | ICD-10-CM

## 2024-12-23 ENCOUNTER — Encounter: Admitting: Physician Assistant

## 2024-12-23 ENCOUNTER — Telehealth: Payer: Self-pay

## 2024-12-23 NOTE — Telephone Encounter (Signed)
 Called to check on patient symptoms. Her niece [name] stated that she was doing better and no worsening symptoms. Advised her that we will see her in a year from last office visit. She verbalized understanding

## 2024-12-23 NOTE — Telephone Encounter (Signed)
-----   Message from Vishnu Mallipeddi, MD sent at 12/23/2024  1:08 PM EST ----- Thank you for sharing results. Danta Baumgardner, can you find out if her DOE is worsening? If yes, can see her in 2-3 months. If not, keep the scheduled f/u.

## 2025-01-25 ENCOUNTER — Inpatient Hospital Stay: Attending: Oncology

## 2025-02-01 ENCOUNTER — Inpatient Hospital Stay: Admitting: Oncology

## 2025-03-08 ENCOUNTER — Ambulatory Visit: Admitting: Internal Medicine
